# Patient Record
Sex: Male | Born: 1945 | Hispanic: No | Smoking: Former smoker
Health system: Southern US, Community
[De-identification: ages and names within clinical notes are randomized; demographics above are authoritative.]

## PROBLEM LIST (undated history)

## (undated) ENCOUNTER — Other Ambulatory Visit

## (undated) ENCOUNTER — Encounter

## (undated) ENCOUNTER — Ambulatory Visit: Payer: MEDICARE

## (undated) ENCOUNTER — Ambulatory Visit

## (undated) ENCOUNTER — Ambulatory Visit
Payer: MEDICARE | Attending: Student in an Organized Health Care Education/Training Program | Primary: Student in an Organized Health Care Education/Training Program

## (undated) ENCOUNTER — Telehealth

## (undated) ENCOUNTER — Ambulatory Visit
Payer: MEDICARE | Attending: Rehabilitative and Restorative Service Providers" | Primary: Rehabilitative and Restorative Service Providers"

## (undated) ENCOUNTER — Encounter
Attending: Student in an Organized Health Care Education/Training Program | Primary: Student in an Organized Health Care Education/Training Program

## (undated) ENCOUNTER — Ambulatory Visit: Payer: Medicare (Managed Care)

## (undated) ENCOUNTER — Ambulatory Visit
Payer: MEDICAID | Attending: Student in an Organized Health Care Education/Training Program | Primary: Student in an Organized Health Care Education/Training Program

## (undated) ENCOUNTER — Encounter: Attending: Neurology | Primary: Neurology

## (undated) ENCOUNTER — Inpatient Hospital Stay: Payer: MEDICARE

## (undated) ENCOUNTER — Telehealth
Attending: Student in an Organized Health Care Education/Training Program | Primary: Student in an Organized Health Care Education/Training Program

## (undated) ENCOUNTER — Ambulatory Visit
Attending: Student in an Organized Health Care Education/Training Program | Primary: Student in an Organized Health Care Education/Training Program

## (undated) DIAGNOSIS — I2699 Other pulmonary embolism without acute cor pulmonale: Secondary | ICD-10-CM

## (undated) DIAGNOSIS — I509 Heart failure, unspecified: Secondary | ICD-10-CM

## (undated) DIAGNOSIS — M1990 Unspecified osteoarthritis, unspecified site: Secondary | ICD-10-CM

## (undated) DIAGNOSIS — J449 Chronic obstructive pulmonary disease, unspecified: Secondary | ICD-10-CM

## (undated) DIAGNOSIS — R011 Cardiac murmur, unspecified: Secondary | ICD-10-CM

## (undated) DIAGNOSIS — Z972 Presence of dental prosthetic device (complete) (partial): Secondary | ICD-10-CM

## (undated) DIAGNOSIS — E119 Type 2 diabetes mellitus without complications: Secondary | ICD-10-CM

## (undated) DIAGNOSIS — B029 Zoster without complications: Secondary | ICD-10-CM

## (undated) DIAGNOSIS — G709 Myoneural disorder, unspecified: Secondary | ICD-10-CM

## (undated) DIAGNOSIS — E1140 Type 2 diabetes mellitus with diabetic neuropathy, unspecified: Secondary | ICD-10-CM

## (undated) DIAGNOSIS — G4730 Sleep apnea, unspecified: Secondary | ICD-10-CM

## (undated) DIAGNOSIS — G629 Polyneuropathy, unspecified: Secondary | ICD-10-CM

## (undated) DIAGNOSIS — I4891 Unspecified atrial fibrillation: Secondary | ICD-10-CM

## (undated) DIAGNOSIS — I10 Essential (primary) hypertension: Secondary | ICD-10-CM

## (undated) HISTORY — DX: Chronic obstructive pulmonary disease, unspecified: J44.9

## (undated) HISTORY — PX: KNEE SURGERY: SHX244

## (undated) HISTORY — DX: Essential (primary) hypertension: I10

## (undated) HISTORY — PX: FINGER SURGERY: SHX640

## (undated) HISTORY — PX: WRIST SURGERY: SHX841

## (undated) HISTORY — DX: Type 2 diabetes mellitus without complications: E11.9

## (undated) HISTORY — DX: Type 2 diabetes mellitus with diabetic neuropathy, unspecified: E11.40

---

## 2013-05-22 DIAGNOSIS — H2510 Age-related nuclear cataract, unspecified eye: Secondary | ICD-10-CM | POA: Insufficient documentation

## 2013-09-16 DIAGNOSIS — E669 Obesity, unspecified: Secondary | ICD-10-CM | POA: Insufficient documentation

## 2013-09-16 DIAGNOSIS — J449 Chronic obstructive pulmonary disease, unspecified: Secondary | ICD-10-CM | POA: Insufficient documentation

## 2013-09-16 DIAGNOSIS — I10 Essential (primary) hypertension: Secondary | ICD-10-CM | POA: Insufficient documentation

## 2013-09-16 DIAGNOSIS — I480 Paroxysmal atrial fibrillation: Secondary | ICD-10-CM | POA: Insufficient documentation

## 2013-09-16 DIAGNOSIS — I5021 Acute systolic (congestive) heart failure: Secondary | ICD-10-CM | POA: Insufficient documentation

## 2013-10-08 DIAGNOSIS — I5020 Unspecified systolic (congestive) heart failure: Secondary | ICD-10-CM | POA: Insufficient documentation

## 2013-10-08 DIAGNOSIS — I429 Cardiomyopathy, unspecified: Secondary | ICD-10-CM | POA: Insufficient documentation

## 2013-11-05 DIAGNOSIS — Z1211 Encounter for screening for malignant neoplasm of colon: Secondary | ICD-10-CM | POA: Insufficient documentation

## 2013-11-11 DIAGNOSIS — J439 Emphysema, unspecified: Secondary | ICD-10-CM | POA: Insufficient documentation

## 2013-11-11 DIAGNOSIS — I5022 Chronic systolic (congestive) heart failure: Secondary | ICD-10-CM | POA: Insufficient documentation

## 2013-11-11 DIAGNOSIS — I10 Essential (primary) hypertension: Secondary | ICD-10-CM | POA: Insufficient documentation

## 2013-11-19 DIAGNOSIS — B351 Tinea unguium: Secondary | ICD-10-CM | POA: Insufficient documentation

## 2013-11-25 DIAGNOSIS — H02403 Unspecified ptosis of bilateral eyelids: Secondary | ICD-10-CM | POA: Insufficient documentation

## 2013-11-25 DIAGNOSIS — E119 Type 2 diabetes mellitus without complications: Secondary | ICD-10-CM | POA: Insufficient documentation

## 2014-01-29 DIAGNOSIS — M2142 Flat foot [pes planus] (acquired), left foot: Secondary | ICD-10-CM

## 2014-01-29 DIAGNOSIS — M2141 Flat foot [pes planus] (acquired), right foot: Secondary | ICD-10-CM | POA: Insufficient documentation

## 2014-01-29 DIAGNOSIS — B353 Tinea pedis: Secondary | ICD-10-CM | POA: Insufficient documentation

## 2014-01-29 DIAGNOSIS — M79671 Pain in right foot: Secondary | ICD-10-CM | POA: Insufficient documentation

## 2014-01-29 DIAGNOSIS — M79672 Pain in left foot: Secondary | ICD-10-CM

## 2014-02-27 DIAGNOSIS — H401190 Primary open-angle glaucoma, unspecified eye, stage unspecified: Secondary | ICD-10-CM | POA: Insufficient documentation

## 2014-04-01 DIAGNOSIS — H25819 Combined forms of age-related cataract, unspecified eye: Secondary | ICD-10-CM | POA: Insufficient documentation

## 2014-04-01 DIAGNOSIS — M3501 Sicca syndrome with keratoconjunctivitis: Secondary | ICD-10-CM | POA: Insufficient documentation

## 2014-04-01 DIAGNOSIS — H2510 Age-related nuclear cataract, unspecified eye: Secondary | ICD-10-CM | POA: Insufficient documentation

## 2014-04-01 DIAGNOSIS — H16223 Keratoconjunctivitis sicca, not specified as Sjogren's, bilateral: Secondary | ICD-10-CM

## 2014-08-04 DIAGNOSIS — E1140 Type 2 diabetes mellitus with diabetic neuropathy, unspecified: Secondary | ICD-10-CM | POA: Insufficient documentation

## 2014-09-10 DIAGNOSIS — M1712 Unilateral primary osteoarthritis, left knee: Secondary | ICD-10-CM | POA: Insufficient documentation

## 2014-10-29 DIAGNOSIS — M25562 Pain in left knee: Secondary | ICD-10-CM | POA: Insufficient documentation

## 2015-03-16 DIAGNOSIS — G4733 Obstructive sleep apnea (adult) (pediatric): Secondary | ICD-10-CM | POA: Insufficient documentation

## 2015-03-16 DIAGNOSIS — J449 Chronic obstructive pulmonary disease, unspecified: Secondary | ICD-10-CM | POA: Insufficient documentation

## 2015-03-16 DIAGNOSIS — H401133 Primary open-angle glaucoma, bilateral, severe stage: Secondary | ICD-10-CM | POA: Insufficient documentation

## 2015-03-16 DIAGNOSIS — E119 Type 2 diabetes mellitus without complications: Secondary | ICD-10-CM | POA: Insufficient documentation

## 2015-03-16 DIAGNOSIS — H2510 Age-related nuclear cataract, unspecified eye: Secondary | ICD-10-CM | POA: Insufficient documentation

## 2015-08-30 HISTORY — PX: COLONOSCOPY: SHX174

## 2015-12-28 DIAGNOSIS — R0902 Hypoxemia: Secondary | ICD-10-CM | POA: Insufficient documentation

## 2016-02-09 DIAGNOSIS — R911 Solitary pulmonary nodule: Secondary | ICD-10-CM | POA: Insufficient documentation

## 2016-02-15 ENCOUNTER — Encounter: Payer: Self-pay | Admitting: Family Medicine

## 2016-02-15 ENCOUNTER — Ambulatory Visit (INDEPENDENT_AMBULATORY_CARE_PROVIDER_SITE_OTHER): Payer: Medicare Other | Admitting: Family Medicine

## 2016-02-15 VITALS — BP 132/78 | HR 79 | Ht 70.0 in | Wt 242.0 lb

## 2016-02-15 DIAGNOSIS — Z0000 Encounter for general adult medical examination without abnormal findings: Secondary | ICD-10-CM

## 2016-02-15 DIAGNOSIS — I5022 Chronic systolic (congestive) heart failure: Secondary | ICD-10-CM

## 2016-02-15 DIAGNOSIS — E1140 Type 2 diabetes mellitus with diabetic neuropathy, unspecified: Secondary | ICD-10-CM | POA: Diagnosis not present

## 2016-02-15 DIAGNOSIS — J432 Centrilobular emphysema: Secondary | ICD-10-CM

## 2016-02-15 DIAGNOSIS — I10 Essential (primary) hypertension: Secondary | ICD-10-CM

## 2016-02-15 NOTE — Progress Notes (Signed)
Name: Mario [name]   MRN: [id number]    DOB: [birth year]   Date:02/15/2016       Progress Note  Subjective  Chief Complaint  Chief Complaint  Patient presents with  . Establish Care    new to area  . Diabetes    discuss diabetes results going up    Diabetes  He presents for his follow-up diabetic visit. He has type 2 diabetes mellitus. His disease course has been fluctuating. There are no hypoglycemic associated symptoms. Pertinent negatives for hypoglycemia include no dizziness, headaches or nervousness/anxiousness. There are no diabetic associated symptoms. Pertinent negatives for diabetes include no blurred vision, no chest pain, no polydipsia and no weight loss. Symptoms are stable. There are no diabetic complications. Risk factors for coronary artery disease include dyslipidemia, diabetes mellitus, hypertension, male sex and obesity. Current diabetic treatment includes oral agent (monotherapy). He is compliant with treatment all of the time. His weight is stable. He is following a generally healthy diet. He participates in exercise intermittently. His breakfast blood glucose is taken between 8-9 am. His breakfast blood glucose range is generally 140-180 mg/dl. An ACE inhibitor/angiotensin II receptor blocker is being taken. He does not see a podiatrist.Eye exam is current.    No problem-specific Assessment & Plan notes found for this encounter.   Past Medical History:  Diagnosis Date  . COPD (chronic obstructive pulmonary disease) (HCC)   . Diabetes mellitus without complication (HCC)   . Diabetic neuropathy (HCC)   . Hypertension     Past Surgical History:  Procedure Laterality Date  . COLONOSCOPY  08/30/2015  . KNEE SURGERY Right    screw in knee  . WRIST SURGERY Left    metal removed    History reviewed. No pertinent family history.  Social History   Social History  . Marital status: Divorced    Spouse name: N/A  . Number of children: N/A  . Years of education:  N/A   Occupational History  . Not on file.   Social History Main Topics  . Smoking status: Former Games developer  . Smokeless tobacco: Never Used  . Alcohol use No  . Drug use: No  . Sexual activity: Not on file   Other Topics Concern  . Not on file   Social History Narrative  . No narrative on file    Allergies  Allergen Reactions  . Ace Inhibitors Anxiety     Review of Systems  Constitutional: Negative for chills, fever, malaise/fatigue and weight loss.  HENT: Negative for ear discharge, ear pain and sore throat.   Eyes: Negative for blurred vision.  Respiratory: Negative for cough, sputum production, shortness of breath and wheezing.   Cardiovascular: Negative for chest pain, palpitations and leg swelling.  Gastrointestinal: Negative for abdominal pain, blood in stool, constipation, diarrhea, heartburn, melena and nausea.  Genitourinary: Negative for dysuria, frequency, hematuria and urgency.  Musculoskeletal: Negative for back pain, joint pain, myalgias and neck pain.  Skin: Negative for rash.  Neurological: Negative for dizziness, tingling, sensory change, focal weakness and headaches.  Endo/Heme/Allergies: Negative for environmental allergies and polydipsia. Does not bruise/bleed easily.  Psychiatric/Behavioral: Negative for depression and suicidal ideas. The patient is not nervous/anxious and does not have insomnia.      Objective  Vitals:   02/15/16 1417  BP: 132/78  Pulse: 79  Weight: 242 lb (109.8 kg)  Height: 5\' 10"  (1.778 m)    Physical Exam  Constitutional: He is oriented to person, place, and time and well-developed, well-nourished,  and in no distress.  HENT:  Head: Normocephalic.  Right Ear: External ear normal.  Left Ear: External ear normal.  Nose: Nose normal.  Mouth/Throat: Oropharynx is clear and moist.  Eyes: Conjunctivae and EOM are normal. Pupils are equal, round, and reactive to light. Right eye exhibits no discharge. Left eye exhibits no  discharge. No scleral icterus.  Neck: Normal range of motion. Neck supple. No JVD present. No tracheal deviation present. No thyromegaly present.  Cardiovascular: Normal rate, regular rhythm, normal heart sounds and intact distal pulses.  Exam reveals no gallop and no friction rub.   No murmur heard. Pulmonary/Chest: Breath sounds normal. No respiratory distress. He has no wheezes. He has no rales.  Abdominal: Soft. Bowel sounds are normal. He exhibits no mass. There is no hepatosplenomegaly. There is no tenderness. There is no rebound, no guarding and no CVA tenderness.  Musculoskeletal: Normal range of motion. He exhibits no edema or tenderness.  Lymphadenopathy:    He has no cervical adenopathy.  Neurological: He is alert and oriented to person, place, and time. He has normal sensation, normal strength, normal reflexes and intact cranial nerves. No cranial nerve deficit.  Skin: Skin is warm. No rash noted.  Psychiatric: Mood and affect normal.  Nursing note and vitals reviewed.     Assessment & Plan  Problem List Items Addressed This Visit      Cardiovascular and Mediastinum   Chronic systolic heart failure (HCC)   Relevant Medications   losartan (COZAAR) 50 MG tablet   edoxaban (SAVAYSA) 60 MG TABS tablet   amLODipine (NORVASC) 5 MG tablet   sotalol (BETAPACE) 80 MG tablet   terazosin (HYTRIN) 5 MG capsule   furosemide (LASIX) 80 MG tablet   Other Relevant Orders   Renal Function Panel   Essential hypertension   Relevant Medications   losartan (COZAAR) 50 MG tablet   edoxaban (SAVAYSA) 60 MG TABS tablet   amLODipine (NORVASC) 5 MG tablet   sotalol (BETAPACE) 80 MG tablet   terazosin (HYTRIN) 5 MG capsule   furosemide (LASIX) 80 MG tablet   Other Relevant Orders   Renal Function Panel     Respiratory   COPD (chronic obstructive pulmonary disease) (HCC)   Relevant Medications   albuterol (PROVENTIL HFA;VENTOLIN HFA) 108 (90 Base) MCG/ACT inhaler   umeclidinium bromide  (INCRUSE ELLIPTA) 62.5 MCG/INH AEPB   fluticasone-salmeterol (ADVAIR HFA) 230-21 MCG/ACT inhaler   ipratropium-albuterol (DUONEB) 0.5-2.5 (3) MG/3ML SOLN     Endocrine   Type 2 diabetes mellitus with diabetic neuropathy (HCC)   Relevant Medications   metFORMIN (GLUCOPHAGE) 1000 MG tablet   losartan (COZAAR) 50 MG tablet   Other Relevant Orders   Renal Function Panel   Hemoglobin A1c    Other Visit Diagnoses    Encounter for medical examination to establish care    -  Primary     I spent 30 minutes with this patient, More than 50% of that time was spent in face to face education, counseling and care coordination.    Dr. [name] Rasmusseneanna Larin Depaoli [facility]  02/15/16

## 2016-02-16 LAB — RENAL FUNCTION PANEL
Albumin: 4.3 g/dL (ref 3.6–4.8)
BUN / CREAT RATIO: 20 (ref 10–24)
BUN: 24 mg/dL (ref 8–27)
CO2: 28 mmol/L (ref 18–29)
CREATININE: 1.22 mg/dL (ref 0.76–1.27)
Calcium: 9.5 mg/dL (ref 8.6–10.2)
Chloride: 97 mmol/L (ref 96–106)
GFR, EST AFRICAN AMERICAN: 69 mL/min/{1.73_m2} (ref >59)
GFR, EST NON AFRICAN AMERICAN: 60 mL/min/{1.73_m2} (ref >59)
Glucose: 87 mg/dL (ref 65–99)
Phosphorus: 3.2 mg/dL (ref 2.5–4.5)
Potassium: 4.2 mmol/L (ref 3.5–5.2)
SODIUM: 141 mmol/L (ref 134–144)

## 2016-02-16 LAB — HEMOGLOBIN A1C
Est. average glucose Bld gHb Est-mCnc: 148 mg/dL
Hgb A1c MFr Bld: 6.8 % — ABNORMAL HIGH (ref 4.8–5.6)

## 2016-04-04 ENCOUNTER — Encounter: Payer: Medicare Other | Attending: Internal Medicine | Admitting: Respiratory Therapy

## 2016-04-04 VITALS — Ht 70.0 in | Wt 249.6 lb

## 2016-04-04 DIAGNOSIS — J449 Chronic obstructive pulmonary disease, unspecified: Secondary | ICD-10-CM | POA: Diagnosis not present

## 2016-04-04 NOTE — Progress Notes (Signed)
Pulmonary Individual Treatment Plan  Patient Details  Name: Mario [name] MRN: [id number] Date of Birth: Jan 31, 1946 Referring Provider:   April Manson Pulmonary Rehab from 04/04/2016 in [facility] Cardiac and Pulmonary Rehab  Referring Provider  [place]      Initial Encounter Date:  Flowsheet Row Pulmonary Rehab from 04/04/2016 in [facility] Cardiac and Pulmonary Rehab  Date  04/04/16  Referring Provider  Thies      Visit Diagnosis: COPD, mild ([place])  Patient's Home Medications on Admission:  Current Outpatient Prescriptions:    albuterol (PROVENTIL HFA;VENTOLIN HFA) 108 (90 Base) MCG/ACT inhaler, Inhale 2 puffs into the lungs as needed., Disp: , Rfl:    amLODipine (NORVASC) 5 MG tablet, Take 1 tablet by mouth daily., Disp: , Rfl:    diclofenac sodium (VOLTAREN) 1 % GEL, Apply 1 application topically 4 (four) times daily as needed., Disp: , Rfl:    edoxaban (SAVAYSA) 60 MG TABS tablet, Take 60 mg by mouth daily. cardiology, Disp: , Rfl:    fluticasone-salmeterol (ADVAIR HFA) 230-21 MCG/ACT inhaler, Inhale 2 puffs into the lungs 2 (two) times daily., Disp: , Rfl:    furosemide (LASIX) 80 MG tablet, Take 1 tablet by mouth daily., Disp: , Rfl:    gabapentin (NEURONTIN) 300 MG capsule, Take 1 capsule by mouth 3 (three) times daily., Disp: , Rfl:    glucose blood (ONE TOUCH ULTRA TEST) test strip, Use as instructed FOR TESTING three times daily.  E11.9, Disp: , Rfl:    ipratropium-albuterol (DUONEB) 0.5-2.5 (3) MG/3ML SOLN, Inhale into the lungs., Disp: , Rfl:    losartan (COZAAR) 50 MG tablet, Take 1 tablet by mouth daily., Disp: , Rfl:    metFORMIN (GLUCOPHAGE) 1000 MG tablet, Take 1 tablet by mouth 2 (two) times daily., Disp: , Rfl:    sotalol (BETAPACE) 80 MG tablet, Take 1 tablet by mouth 2 (two) times daily., Disp: , Rfl:    terazosin (HYTRIN) 5 MG capsule, Take 1 capsule by mouth every other day., Disp: , Rfl:    umeclidinium bromide (INCRUSE ELLIPTA) 62.5 MCG/INH AEPB, Inhale 1  puff into the lungs daily., Disp: , Rfl:   Past Medical History: Past Medical History:  Diagnosis Date   COPD (chronic obstructive pulmonary disease) ([place])    Diabetes mellitus without complication ([place])    Diabetic neuropathy ([place])    Hypertension     Tobacco Use: History  Smoking Status   Former Smoker  Smokeless Tobacco   Never Used    Labs: 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 Cardiac and Pulmonary Rehab Latest Ref Rng & Units 02/15/2016   Hemoglobin A1c 4.8 - 5.6 % 6.8(H)       ADL UCSD:     Pulmonary Assessment Scores    Row Name 04/04/16 1253         ADL UCSD   ADL Phase Entry     SOB Score total 40     Rest 0     Walk 3     Stairs 4     Bath 1     Dress 0     Shop 2        Pulmonary Function Assessment:     Pulmonary Function Assessment - 04/04/16 1252      Pulmonary Function Tests   FVC% 34 %   FEV1% 35 %   FEV1/FVC Ratio 79.3     Initial Spirometry Results   Comments Test date 04/04/16     Breath  Bilateral Breath Sounds Decreased;Clear   Shortness of Breath Yes;Limiting activity;Fear of Shortness of Breath      Exercise Target Goals: Date: 04/04/16  Exercise Program Goal: Individual exercise prescription set with THRR, safety & activity barriers. Participant demonstrates ability to understand and report RPE using BORG scale, to self-measure pulse accurately, and to acknowledge the importance of the exercise prescription.  Exercise Prescription Goal: Starting with aerobic activity 30 plus minutes a day, 3 days per week for initial exercise prescription. Provide home exercise prescription and guidelines that participant acknowledges understanding prior to discharge.  Activity Barriers & Risk Stratification:     Activity Barriers & Cardiac Risk Stratification - 04/04/16 1251      Activity Barriers & Cardiac Risk Stratification   Activity Barriers Arthritis;Shortness of Breath;Deconditioning;Joint Problems    Cardiac Risk Stratification Moderate      6 Minute Walk:     6 Minute Walk    Row Name 04/04/16 1241         6 Minute Walk   Distance 1035 feet     Walk Time 6 minutes     # of Rest Breaks 0     MPH 1.96     METS 2.53     RPE 11     Perceived Dyspnea  2     VO2 Peak 8.65     Symptoms No     Resting HR 66 bpm     Resting BP 130/82     Max Ex. HR 122 bpm     Max Ex. BP 158/84       Interval HR   Baseline HR 66     1 Minute HR 94     2 Minute HR 96     3 Minute HR 110     4 Minute HR 100     5 Minute HR 122     6 Minute HR 84     2 Minute Post HR 69     Interval Heart Rate? Yes       Interval Oxygen   Interval Oxygen? Yes     Baseline Oxygen Saturation % 94 %     Baseline Liters of Oxygen 2 L     1 Minute Oxygen Saturation % 89 %     1 Minute Liters of Oxygen 2 L     2 Minute Oxygen Saturation % 96 %     2 Minute Liters of Oxygen 2 L     3 Minute Oxygen Saturation % 86 %     3 Minute Liters of Oxygen 2 L     4 Minute Oxygen Saturation % 86 %     4 Minute Liters of Oxygen 2 L     5 Minute Oxygen Saturation % 85 %     5 Minute Liters of Oxygen 2 L     6 Minute Oxygen Saturation % 83 %     6 Minute Liters of Oxygen 2 L     2 Minute Post Oxygen Saturation % 95 %     2 Minute Post Liters of Oxygen 2 L        Initial Exercise Prescription:     Initial Exercise Prescription - 04/04/16 1200      Date of Initial Exercise RX and Referring Provider   Date 04/04/16   Referring Provider Thies     Oxygen   Oxygen Continuous   Liters 2     Treadmill   MPH 1.8  Grade 0   Minutes 15   METs 2.38     NuStep   Level 3   Minutes 15   METs 2.3     Recumbant Elliptical   Level 1   RPM 50   Minutes 15   METs 2.3     T5 Nustep   Level 2   Minutes 15   METs 2.3     Biostep-RELP   Level 3   Minutes 15   METs 2.3     Prescription Details   Frequency (times per week) 3   Duration Progress to 45 minutes of aerobic exercise without signs/symptoms of  physical distress     Intensity   THRR 40-80% of Max Heartrate 100-134   Ratings of Perceived Exertion 11-13   Perceived Dyspnea 0-4     Progression   Progression Continue to progress workloads to maintain intensity without signs/symptoms of physical distress.     Resistance Training   Training Prescription Yes   Weight 3   Reps 10-15      Perform Capillary Blood Glucose checks as needed.  Exercise Prescription Changes:   Exercise Comments:   Discharge Exercise Prescription (Final Exercise Prescription Changes):    Nutrition:  Target Goals: Understanding of nutrition guidelines, daily intake of sodium 1500mg , cholesterol 200mg , calories 30% from fat and 7% or less from saturated fats, daily to have 5 or more servings of fruits and vegetables.  Biometrics:     Pre Biometrics - 04/04/16 1241      Pre Biometrics   Height 5\' 10"  (1.778 m)   Weight 249 lb 9.6 oz (113.2 kg)   Waist Circumference 51.75 inches   Hip Circumference 43 inches   Waist to Hip Ratio 1.2 %   BMI (Calculated) 35.9       Nutrition Therapy Plan and Nutrition Goals:   Nutrition Discharge: Rate Your Plate Scores:   Psychosocial: Target Goals: Acknowledge presence or absence of depression, maximize coping skills, provide positive support system. Participant is able to verbalize types and ability to use techniques and skills needed for reducing stress and depression.  Initial Review & Psychosocial Screening:     Initial Psych Review & Screening - 04/04/16 1315      Family Dynamics   Good Support System? Yes   Comments Mario [name] has good support from his wife. He hopes to be more active. He was very athletic in the past and misses being fit. [place], he has stress with new physician appointments, but Mario [name] is looking forward to participating in [name].      Barriers   Psychosocial barriers to participate in program The patient should benefit  from training in stress management and relaxation.     Screening Interventions   Interventions Encouraged to exercise;Program counselor consult      Quality of Life Scores:     Quality of Life - 04/04/16 1320      Quality of Life Scores   Health/Function Pre 19.63 %   Socioeconomic Pre 20.71 %   Psych/Spiritual Pre 21 %   Family Pre 21 %   GLOBAL Pre 20.31 %      PHQ-9: Recent Review Flowsheet Data    Depression screen [facility] 04/04/2016 02/15/2016   Decreased Interest 2 0   Down, Depressed, Hopeless 2 0   PHQ - 2 Score 4 0   Altered sleeping 1 -   Tired, decreased energy 3 -   Change  in appetite 1 -   Feeling bad or failure about yourself  0 -   Trouble concentrating 1 -   Moving slowly or fidgety/restless 0 -   Suicidal thoughts 0 -   PHQ-9 Score 10 -   Difficult doing work/chores Somewhat difficult -      Psychosocial Evaluation and Intervention:   Psychosocial Re-Evaluation:  Education: Education Goals: Education classes will be provided on a weekly basis, covering required topics. Participant will state understanding/return demonstration of topics presented.  Learning Barriers/Preferences:     Learning Barriers/Preferences - 04/04/16 1251      Learning Barriers/Preferences   Learning Barriers None   Learning Preferences None      Education Topics: Initial Evaluation Education: - Verbal, written and demonstration of respiratory meds, RPE/PD scales, oximetry and breathing techniques. Instruction on use of nebulizers and MDIs: cleaning and proper use, rinsing mouth with steroid doses and importance of monitoring MDI activations. Flowsheet Row Pulmonary Rehab [facility]  Date  04/04/16  Educator  LB  Instruction Review Code  2- meets goals/outcomes      General Nutrition Guidelines/Fats and Fiber: -Group instruction provided by verbal, written material, models and posters to present the general guidelines for  heart healthy nutrition. Gives an explanation and review of dietary fats and fiber.   Controlling Sodium/Reading Food Labels: -Group verbal and written material supporting the discussion of sodium use in heart healthy nutrition. Review and explanation with models, verbal and written materials for utilization of the food label.   Exercise Physiology & Risk Factors: - Group verbal and written instruction with models to review the exercise physiology of the cardiovascular system and associated critical values. Details cardiovascular disease risk factors and the goals associated with each risk factor.   Aerobic Exercise & Resistance Training: - Gives group verbal and written discussion on the health impact of inactivity. On the components of aerobic and resistive training programs and the benefits of this training and how to safely progress through these programs.   Flexibility, Balance, General Exercise Guidelines: - Provides group verbal and written instruction on the benefits of flexibility and balance training programs. Provides general exercise guidelines with specific guidelines to those with heart or lung disease. Demonstration and skill practice provided.   Stress Management: - Provides group verbal and written instruction about the health risks of elevated stress, cause of high stress, and healthy ways to reduce stress.   Depression: - Provides group verbal and written instruction on the correlation between heart/lung disease and depressed mood, treatment options, and the stigmas associated with seeking treatment.   Exercise & Equipment Safety: - Individual verbal instruction and demonstration of equipment use and safety with use of the equipment.   Infection Prevention: - Provides verbal and written material to individual with discussion of infection control including proper hand washing and proper equipment cleaning during exercise session. Flowsheet Row Pulmonary Rehab [facility]  [facility]04[facility]  Date  04/04/16  Educator  LB  Instruction Review Code  2- meets goals/outcomes      Falls Prevention: - Provides verbal and written material to individual with discussion of falls prevention and safety. Flowsheet Row [facility]  Date  04/04/16  Educator  LB  Instruction Review Code  2- meets goals/outcomes      Diabetes: - Individual verbal and written instruction to review signs/symptoms of diabetes, desired ranges of glucose level fasting, after  meals and with exercise. Advice that pre and post exercise glucose checks will be done for 3 sessions at entry of program. Flowsheet Row Pulmonary Rehab from 04/04/2016 in [facility] and Pulmonary Rehab  Date  04/04/16  Educator  LB  Instruction Review Code  2- meets goals/outcomes      Chronic Lung Diseases: - Group verbal and written instruction to review new updates, new respiratory medications, new advancements in procedures and treatments. Provide informative websites and "800" numbers of self-education.   Lung Procedures: - Group verbal and written instruction to describe testing methods done to diagnose lung disease. Review the outcome of test results. Describe the treatment choices: Pulmonary Function Tests, ABGs and oximetry.   Energy Conservation: - Provide group verbal and written instruction for methods to conserve energy, plan and organize activities. Instruct on pacing techniques, use of adaptive equipment and posture/positioning to relieve shortness of breath.   Triggers: - Group verbal and written instruction to review types of environmental controls: home humidity, furnaces, filters, dust mite/pet prevention, HEPA vacuums. To discuss weather changes, air quality and the benefits of nasal washing.   Exacerbations: - Group verbal and written instruction to provide: warning signs, infection symptoms, calling MD  promptly, preventive modes, and value of vaccinations. Review: effective airway clearance, coughing and/or vibration techniques. Create an Sport and exercise psychologist.   Oxygen: - Individual and group verbal and written instruction on oxygen therapy. Includes supplement oxygen, available portable oxygen systems, continuous and intermittent flow rates, oxygen safety, concentrators, and Medicare reimbursement for oxygen. Flowsheet Row Pulmonary Rehab from 04/04/2016 in [facility] Cardiac and Pulmonary Rehab  Date  04/04/16  Educator  LB  Instruction Review Code  2- meets goals/outcomes      Respiratory Medications: - Group verbal and written instruction to review medications for lung disease. Drug class, frequency, complications, importance of spacers, rinsing mouth after steroid MDI's, and proper cleaning methods for nebulizers.   AED/CPR: - Group verbal and written instruction with the use of models to demonstrate the basic use of the AED with the basic ABC's of resuscitation.   Breathing Retraining: - Provides individuals verbal and written instruction on purpose, frequency, and proper technique of diaphragmatic breathing and pursed-lipped breathing. Applies individual practice skills. Flowsheet Row Pulmonary Rehab from 04/04/2016 in [facility] Cardiac and Pulmonary Rehab  Date  04/04/16  Educator  LB  Instruction Review Code  2- meets goals/outcomes      Anatomy and Physiology of the Lungs: - Group verbal and written instruction with the use of models to provide basic lung anatomy and physiology related to function, structure and complications of lung disease.   Heart Failure: - Group verbal and written instruction on the basics of heart failure: signs/symptoms, treatments, explanation of ejection fraction, enlarged heart and cardiomyopathy.   Sleep Apnea: - Individual verbal and written instruction to review Obstructive Sleep Apnea. Review of risk factors, methods for diagnosing and types of masks and  machines for OSA.   Anxiety: - Provides group, verbal and written instruction on the correlation between heart/lung disease and anxiety, treatment options, and management of anxiety.   Relaxation: - Provides group, verbal and written instruction about the benefits of relaxation for patients with heart/lung disease. Also provides patients with examples of relaxation techniques.   Knowledge Questionnaire Score:     Knowledge Questionnaire Score - 04/04/16 1251      Knowledge Questionnaire Score   Pre Score 6/10       Core Components/Risk Factors/Patient Goals at Admission:  Personal Goals and Risk Factors at Admission - 04/04/16 1301      Core Components/Risk Factors/Patient Goals on Admission    Weight Management Yes;Weight Loss   Intervention Weight Management: Develop a combined nutrition and exercise program designed to reach desired caloric intake, while maintaining appropriate intake of nutrient and fiber, sodium and fats, and appropriate energy expenditure required for the weight goal.;Weight Management: Provide education and appropriate resources to help participant work on and attain dietary goals.;Weight Management/Obesity: Establish reasonable short term and long term weight goals.   Admit Weight 249 lb 9.6 oz (113.2 kg)   Goal Weight: Short Term 240 lb (108.9 kg)   Goal Weight: Long Term 200 lb (90.7 kg)   Expected Outcomes Short Term: Continue to assess and modify interventions until short term weight is achieved;Long Term: Adherence to nutrition and physical activity/exercise program aimed toward attainment of established weight goal;Weight Maintenance: Understanding of the daily nutrition guidelines, which includes 25-35% calories from fat, 7% or less cal from saturated fats, less than 200mg  cholesterol, less than 1.5gm of sodium, & 5 or more servings of fruits and vegetables daily;Weight Loss: Understanding of general recommendations for a balanced deficit meal plan,  which promotes 1-2 lb weight loss per week and includes a negative energy balance of [phone] kcal/d;Understanding recommendations for meals to include 15-35% energy as protein, 25-35% energy from fat, 35-60% energy from carbohydrates, less than 200mg  of dietary cholesterol, 20-35 gm of total fiber daily;Understanding of distribution of calorie intake throughout the day with the consumption of 4-5 meals/snacks   Sedentary Yes  Planning to join [employer] with wife.   Intervention Provide advice, education, support and counseling about physical activity/exercise needs.;Develop an individualized exercise prescription for aerobic and resistive training based on initial evaluation findings, risk stratification, comorbidities and participant's personal goals.   Expected Outcomes Achievement of increased cardiorespiratory fitness and enhanced flexibility, muscular endurance and strength shown through measurements of functional capacity and personal statement of participant.   Increase Strength and Stamina Yes   Intervention Provide advice, education, support and counseling about physical activity/exercise needs.;Develop an individualized exercise prescription for aerobic and resistive training based on initial evaluation findings, risk stratification, comorbidities and participant's personal goals.   Expected Outcomes Achievement of increased cardiorespiratory fitness and enhanced flexibility, muscular endurance and strength shown through measurements of functional capacity and personal statement of participant.   Improve shortness of breath with ADL's Yes   Intervention Provide education, individualized exercise plan and daily activity instruction to help decrease symptoms of SOB with activities of daily living.   Expected Outcomes Short Term: Achieves a reduction of symptoms when performing activities of daily living.   Develop more efficient breathing techniques such as purse lipped breathing and  diaphragmatic breathing; and practicing self-pacing with activity Yes   Intervention Provide education, demonstration and support about specific breathing techniuqes utilized for more efficient breathing. Include techniques such as pursed lipped breathing, diaphragmatic breathing and self-pacing activity.   Expected Outcomes Short Term: Participant will be able to demonstrate and use breathing techniques as needed throughout daily activities.   Increase knowledge of respiratory medications and ability to use respiratory devices properly  Yes  4l/m oxygen for sleep; Albuterol, Advair, Incruse Ellipta, Duoneb SVN   Intervention Provide education and demonstration as needed of appropriate use of medications, inhalers, and oxygen therapy.   Expected Outcomes Short Term: Achieves understanding of medications use. Understands that oxygen is a medication prescribed by physician. Demonstrates appropriate use of inhaler and oxygen therapy.  Diabetes Yes   Intervention Provide education about signs/symptoms and action to take for hypo/hyperglycemia.;Provide education about proper nutrition, including hydration, and aerobic/resistive exercise prescription along with prescribed medications to achieve blood glucose in normal ranges: Fasting glucose 65-99 mg/dL   Expected Outcomes Short Term: Participant verbalizes understanding of the signs/symptoms and immediate care of hyper/hypoglycemia, proper foot care and importance of medication, aerobic/resistive exercise and nutrition plan for blood glucose control.;Long Term: Attainment of HbA1C < 7%.   Heart Failure Yes   Intervention Provide a combined exercise and nutrition program that is supplemented with education, support and counseling about heart failure. Directed toward relieving symptoms such as shortness of breath, decreased exercise tolerance, and extremity edema.   Expected Outcomes Improve functional capacity of life;Short term: Attendance in program 2-3  days a week with increased exercise capacity. Reported lower sodium intake. Reported increased fruit and vegetable intake. Reports medication compliance.;Short term: Daily weights obtained and reported for increase. Utilizing diuretic protocols set by physician.;Long term: Adoption of self-care skills and reduction of barriers for early signs and symptoms recognition and intervention leading to self-care maintenance.   Hypertension Yes   Intervention Provide education on lifestyle modifcations including regular physical activity/exercise, weight management, moderate sodium restriction and increased consumption of fresh fruit, vegetables, and low fat dairy, alcohol moderation, and smoking cessation.;Monitor prescription use compliance.   Expected Outcomes Short Term: Continued assessment and intervention until BP is < 140/40mm HG in hypertensive participants. < 130/40mm HG in hypertensive participants with diabetes, heart failure or chronic kidney disease.;Long Term: Maintenance of blood pressure at goal levels.      Core Components/Risk Factors/Patient Goals Review:    Core Components/Risk Factors/Patient Goals at Discharge (Final Review):    ITP Comments:   Comments: Mario [name] plans to start [name] on 04/12/16 and attend 3 days/week.

## 2016-04-04 NOTE — Patient Instructions (Signed)
Patient Instructions  Patient Details  Name: Mario [name] MRN: [id number] Date of Birth: 03-15-1946 Referring Provider:  Mickey Farberhies, David, MD  Below are the personal goals you chose as well as exercise and nutrition goals. Our goal is to help you keep on track towards obtaining and maintaining your goals. We will be discussing your progress on these goals with you throughout the program.  Initial Exercise Prescription:     Initial Exercise Prescription - 04/04/16 1200      Date of Initial Exercise RX and Referring Provider   Date 04/04/16   Referring Provider Thies     Oxygen   Oxygen Continuous   Liters 2     Treadmill   MPH 1.8   Grade 0   Minutes 15   METs 2.38     NuStep   Level 3   Minutes 15   METs 2.3     Recumbant Elliptical   Level 1   RPM 50   Minutes 15   METs 2.3     T5 Nustep   Level 2   Minutes 15   METs 2.3     Biostep-RELP   Level 3   Minutes 15   METs 2.3     Prescription Details   Frequency (times per week) 3   Duration Progress to 45 minutes of aerobic exercise without signs/symptoms of physical distress     Intensity   THRR 40-80% of Max Heartrate 100-134   Ratings of Perceived Exertion 11-13   Perceived Dyspnea 0-4     Progression   Progression Continue to progress workloads to maintain intensity without signs/symptoms of physical distress.     Resistance Training   Training Prescription Yes   Weight 3   Reps 10-15      Exercise Goals: Frequency: Be able to perform aerobic exercise three times per week working toward 3-5 days per week.  Intensity: Work with a perceived exertion of 11 (fairly light) - 15 (hard) as tolerated. Follow your new exercise prescription and watch for changes in prescription as you progress with the program. Changes will be reviewed with you when they are made.  Duration: You should be able to do 30 minutes of continuous aerobic exercise in addition to a 5 minute warm-up and a 5 minute cool-down  routine.  Nutrition Goals: Your personal nutrition goals will be established when you do your nutrition analysis with the dietician.  The following are nutrition guidelines to follow: Cholesterol < 200mg /day Sodium < 1500mg /day Fiber: Men over 50 yrs - 30 grams per day  Personal Goals:     Personal Goals and Risk Factors at Admission - 04/04/16 1301      Core Components/Risk Factors/Patient Goals on Admission    Weight Management Yes;Weight Loss   Intervention Weight Management: Develop a combined nutrition and exercise program designed to reach desired caloric intake, while maintaining appropriate intake of nutrient and fiber, sodium and fats, and appropriate energy expenditure required for the weight goal.;Weight Management: Provide education and appropriate resources to help participant work on and attain dietary goals.;Weight Management/Obesity: Establish reasonable short term and long term weight goals.   Admit Weight 249 lb 9.6 oz (113.2 kg)   Goal Weight: Short Term 240 lb (108.9 kg)   Goal Weight: Long Term 200 lb (90.7 kg)   Expected Outcomes Short Term: Continue to assess and modify interventions until short term weight is achieved;Long Term: Adherence to nutrition and physical activity/exercise program aimed toward attainment of established weight goal;Weight  Maintenance: Understanding of the daily nutrition guidelines, which includes 25-35% calories from fat, 7% or less cal from saturated fats, less than 200mg  cholesterol, less than 1.5gm of sodium, & 5 or more servings of fruits and vegetables daily;Weight Loss: Understanding of general recommendations for a balanced deficit meal plan, which promotes 1-2 lb weight loss per week and includes a negative energy balance of [phone] kcal/d;Understanding recommendations for meals to include 15-35% energy as protein, 25-35% energy from fat, 35-60% energy from carbohydrates, less than 200mg  of dietary cholesterol, 20-35 gm of total fiber  daily;Understanding of distribution of calorie intake throughout the day with the consumption of 4-5 meals/snacks   Sedentary Yes  Planning to join [employer] with wife.   Intervention Provide advice, education, support and counseling about physical activity/exercise needs.;Develop an individualized exercise prescription for aerobic and resistive training based on initial evaluation findings, risk stratification, comorbidities and participant's personal goals.   Expected Outcomes Achievement of increased cardiorespiratory fitness and enhanced flexibility, muscular endurance and strength shown through measurements of functional capacity and personal statement of participant.   Increase Strength and Stamina Yes   Intervention Provide advice, education, support and counseling about physical activity/exercise needs.;Develop an individualized exercise prescription for aerobic and resistive training based on initial evaluation findings, risk stratification, comorbidities and participant's personal goals.   Expected Outcomes Achievement of increased cardiorespiratory fitness and enhanced flexibility, muscular endurance and strength shown through measurements of functional capacity and personal statement of participant.   Improve shortness of breath with ADL's Yes   Intervention Provide education, individualized exercise plan and daily activity instruction to help decrease symptoms of SOB with activities of daily living.   Expected Outcomes Short Term: Achieves a reduction of symptoms when performing activities of daily living.   Develop more efficient breathing techniques such as purse lipped breathing and diaphragmatic breathing; and practicing self-pacing with activity Yes   Intervention Provide education, demonstration and support about specific breathing techniuqes utilized for more efficient breathing. Include techniques such as pursed lipped breathing, diaphragmatic breathing and self-pacing activity.    Expected Outcomes Short Term: Participant will be able to demonstrate and use breathing techniques as needed throughout daily activities.   Increase knowledge of respiratory medications and ability to use respiratory devices properly  Yes  4l/m oxygen for sleep; Albuterol, Advair, Incruse Ellipta, Duoneb SVN   Intervention Provide education and demonstration as needed of appropriate use of medications, inhalers, and oxygen therapy.   Expected Outcomes Short Term: Achieves understanding of medications use. Understands that oxygen is a medication prescribed by physician. Demonstrates appropriate use of inhaler and oxygen therapy.   Diabetes Yes   Intervention Provide education about signs/symptoms and action to take for hypo/hyperglycemia.;Provide education about proper nutrition, including hydration, and aerobic/resistive exercise prescription along with prescribed medications to achieve blood glucose in normal ranges: Fasting glucose 65-99 mg/dL   Expected Outcomes Short Term: Participant verbalizes understanding of the signs/symptoms and immediate care of hyper/hypoglycemia, proper foot care and importance of medication, aerobic/resistive exercise and nutrition plan for blood glucose control.;Long Term: Attainment of HbA1C < 7%.   Heart Failure Yes   Intervention Provide a combined exercise and nutrition program that is supplemented with education, support and counseling about heart failure. Directed toward relieving symptoms such as shortness of breath, decreased exercise tolerance, and extremity edema.   Expected Outcomes Improve functional capacity of life;Short term: Attendance in program 2-3 days a week with increased exercise capacity. Reported lower sodium intake. Reported increased fruit and vegetable intake. Reports  medication compliance.;Short term: Daily weights obtained and reported for increase. Utilizing diuretic protocols set by physician.;Long term: Adoption of self-care skills and  reduction of barriers for early signs and symptoms recognition and intervention leading to self-care maintenance.   Hypertension Yes   Intervention Provide education on lifestyle modifcations including regular physical activity/exercise, weight management, moderate sodium restriction and increased consumption of fresh fruit, vegetables, and low fat dairy, alcohol moderation, and smoking cessation.;Monitor prescription use compliance.   Expected Outcomes Short Term: Continued assessment and intervention until BP is < 140/3490mm HG in hypertensive participants. < 130/3880mm HG in hypertensive participants with diabetes, heart failure or chronic kidney disease.;Long Term: Maintenance of blood pressure at goal levels.      Tobacco Use Initial Evaluation: History  Smoking Status   Former Smoker  Smokeless Tobacco   Never Used    Copy of goals given to participant.

## 2016-04-12 ENCOUNTER — Encounter: Payer: Self-pay | Admitting: Respiratory Therapy

## 2016-04-12 ENCOUNTER — Telehealth: Payer: Self-pay | Admitting: Respiratory Therapy

## 2016-04-12 DIAGNOSIS — J449 Chronic obstructive pulmonary disease, unspecified: Secondary | ICD-10-CM

## 2016-04-17 ENCOUNTER — Encounter: Payer: Self-pay | Admitting: Respiratory Therapy

## 2016-04-17 ENCOUNTER — Encounter: Payer: Medicare Other | Admitting: *Deleted

## 2016-04-17 DIAGNOSIS — J449 Chronic obstructive pulmonary disease, unspecified: Secondary | ICD-10-CM

## 2016-04-17 LAB — GLUCOSE, CAPILLARY
Glucose-Capillary: 160 mg/dL — ABNORMAL HIGH (ref 65–99)
Glucose-Capillary: 105 mg/dL — ABNORMAL HIGH (ref 65–99)

## 2016-04-17 NOTE — Progress Notes (Signed)
Pulmonary Individual Treatment Plan  Patient Details  Name: Mario [name] MRN: [id number] Date of Birth: Jan 31, 1946 Referring Provider:   April Manson Pulmonary Rehab from 04/04/2016 in [facility] Cardiac and Pulmonary Rehab  Referring Provider  [place]      Initial Encounter Date:  Flowsheet Row Pulmonary Rehab from 04/04/2016 in [facility] Cardiac and Pulmonary Rehab  Date  04/04/16  Referring Provider  Thies      Visit Diagnosis: COPD, mild ([place])  Patient's Home Medications on Admission:  Current Outpatient Prescriptions:    albuterol (PROVENTIL HFA;VENTOLIN HFA) 108 (90 Base) MCG/ACT inhaler, Inhale 2 puffs into the lungs as needed., Disp: , Rfl:    amLODipine (NORVASC) 5 MG tablet, Take 1 tablet by mouth daily., Disp: , Rfl:    diclofenac sodium (VOLTAREN) 1 % GEL, Apply 1 application topically 4 (four) times daily as needed., Disp: , Rfl:    edoxaban (SAVAYSA) 60 MG TABS tablet, Take 60 mg by mouth daily. cardiology, Disp: , Rfl:    fluticasone-salmeterol (ADVAIR HFA) 230-21 MCG/ACT inhaler, Inhale 2 puffs into the lungs 2 (two) times daily., Disp: , Rfl:    furosemide (LASIX) 80 MG tablet, Take 1 tablet by mouth daily., Disp: , Rfl:    gabapentin (NEURONTIN) 300 MG capsule, Take 1 capsule by mouth 3 (three) times daily., Disp: , Rfl:    glucose blood (ONE TOUCH ULTRA TEST) test strip, Use as instructed FOR TESTING three times daily.  E11.9, Disp: , Rfl:    ipratropium-albuterol (DUONEB) 0.5-2.5 (3) MG/3ML SOLN, Inhale into the lungs., Disp: , Rfl:    losartan (COZAAR) 50 MG tablet, Take 1 tablet by mouth daily., Disp: , Rfl:    metFORMIN (GLUCOPHAGE) 1000 MG tablet, Take 1 tablet by mouth 2 (two) times daily., Disp: , Rfl:    sotalol (BETAPACE) 80 MG tablet, Take 1 tablet by mouth 2 (two) times daily., Disp: , Rfl:    terazosin (HYTRIN) 5 MG capsule, Take 1 capsule by mouth every other day., Disp: , Rfl:    umeclidinium bromide (INCRUSE ELLIPTA) 62.5 MCG/INH AEPB, Inhale 1  puff into the lungs daily., Disp: , Rfl:   Past Medical History: Past Medical History:  Diagnosis Date   COPD (chronic obstructive pulmonary disease) ([place])    Diabetes mellitus without complication ([place])    Diabetic neuropathy ([place])    Hypertension     Tobacco Use: History  Smoking Status   Former Smoker  Smokeless Tobacco   Never Used    Labs: 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 Cardiac and Pulmonary Rehab Latest Ref Rng & Units 02/15/2016   Hemoglobin A1c 4.8 - 5.6 % 6.8(H)       ADL UCSD:     Pulmonary Assessment Scores    Row Name 04/04/16 1253         ADL UCSD   ADL Phase Entry     SOB Score total 40     Rest 0     Walk 3     Stairs 4     Bath 1     Dress 0     Shop 2        Pulmonary Function Assessment:     Pulmonary Function Assessment - 04/04/16 1252      Pulmonary Function Tests   FVC% 34 %   FEV1% 35 %   FEV1/FVC Ratio 79.3     Initial Spirometry Results   Comments Test date 04/04/16     Breath  Bilateral Breath Sounds Decreased;Clear   Shortness of Breath Yes;Limiting activity;Fear of Shortness of Breath      Exercise Target Goals:    Exercise Program Goal: Individual exercise prescription set with THRR, safety & activity barriers. Participant demonstrates ability to understand and report RPE using BORG scale, to self-measure pulse accurately, and to acknowledge the importance of the exercise prescription.  Exercise Prescription Goal: Starting with aerobic activity 30 plus minutes a day, 3 days per week for initial exercise prescription. Provide home exercise prescription and guidelines that participant acknowledges understanding prior to discharge.  Activity Barriers & Risk Stratification:     Activity Barriers & Cardiac Risk Stratification - 04/04/16 1251      Activity Barriers & Cardiac Risk Stratification   Activity Barriers Arthritis;Shortness of Breath;Deconditioning;Joint Problems   Cardiac Risk  Stratification Moderate      6 Minute Walk:     6 Minute Walk    Row Name 04/04/16 1241         6 Minute Walk   Distance 1035 feet     Walk Time 6 minutes     # of Rest Breaks 0     MPH 1.96     METS 2.53     RPE 11     Perceived Dyspnea  2     VO2 Peak 8.65     Symptoms No     Resting HR 66 bpm     Resting BP 130/82     Max Ex. HR 122 bpm     Max Ex. BP 158/84       Interval HR   Baseline HR 66     1 Minute HR 94     2 Minute HR 96     3 Minute HR 110     4 Minute HR 100     5 Minute HR 122     6 Minute HR 84     2 Minute Post HR 69     Interval Heart Rate? Yes       Interval Oxygen   Interval Oxygen? Yes     Baseline Oxygen Saturation % 94 %     Baseline Liters of Oxygen 2 L     1 Minute Oxygen Saturation % 89 %     1 Minute Liters of Oxygen 2 L     2 Minute Oxygen Saturation % 96 %     2 Minute Liters of Oxygen 2 L     3 Minute Oxygen Saturation % 86 %     3 Minute Liters of Oxygen 2 L     4 Minute Oxygen Saturation % 86 %     4 Minute Liters of Oxygen 2 L     5 Minute Oxygen Saturation % 85 %     5 Minute Liters of Oxygen 2 L     6 Minute Oxygen Saturation % 83 %     6 Minute Liters of Oxygen 2 L     2 Minute Post Oxygen Saturation % 95 %     2 Minute Post Liters of Oxygen 2 L        Initial Exercise Prescription:     Initial Exercise Prescription - 04/04/16 1200      Date of Initial Exercise RX and Referring Provider   Date 04/04/16   Referring Provider Thies     Oxygen   Oxygen Continuous   Liters 2     Treadmill   MPH 1.8  Grade 0   Minutes 15   METs 2.38     NuStep   Level 3   Minutes 15   METs 2.3     Recumbant Elliptical   Level 1   RPM 50   Minutes 15   METs 2.3     T5 Nustep   Level 2   Minutes 15   METs 2.3     Biostep-RELP   Level 3   Minutes 15   METs 2.3     Prescription Details   Frequency (times per week) 3   Duration Progress to 45 minutes of aerobic exercise without signs/symptoms of physical  distress     Intensity   THRR 40-80% of Max Heartrate 100-134   Ratings of Perceived Exertion 11-13   Perceived Dyspnea 0-4     Progression   Progression Continue to progress workloads to maintain intensity without signs/symptoms of physical distress.     Resistance Training   Training Prescription Yes   Weight 3   Reps 10-15      Perform Capillary Blood Glucose checks as needed.  Exercise Prescription Changes:   Exercise Comments:   Discharge Exercise Prescription (Final Exercise Prescription Changes):    Nutrition:  Target Goals: Understanding of nutrition guidelines, daily intake of sodium 1500mg , cholesterol 200mg , calories 30% from fat and 7% or less from saturated fats, daily to have 5 or more servings of fruits and vegetables.  Biometrics:     Pre Biometrics - 04/04/16 1241      Pre Biometrics   Height 5\' 10"  (1.778 m)   Weight 249 lb 9.6 oz (113.2 kg)   Waist Circumference 51.75 inches   Hip Circumference 43 inches   Waist to Hip Ratio 1.2 %   BMI (Calculated) 35.9       Nutrition Therapy Plan and Nutrition Goals:   Nutrition Discharge: Rate Your Plate Scores:   Psychosocial: Target Goals: Acknowledge presence or absence of depression, maximize coping skills, provide positive support system. Participant is able to verbalize types and ability to use techniques and skills needed for reducing stress and depression.  Initial Review & Psychosocial Screening:     Initial Psych Review & Screening - 04/04/16 1315      Family Dynamics   Good Support System? Yes   Comments Mr Lalor has good support from his wife. He hopes to be more active. He was very athletic in the past and misses being fit. Having recently moved to this area from [place], he has stress with new physician appointments, but Mr Craighead is looking forward to participating in [name].      Barriers   Psychosocial barriers to participate in program The patient should benefit from  training in stress management and relaxation.     Screening Interventions   Interventions Encouraged to exercise;Program counselor consult      Quality of Life Scores:     Quality of Life - 04/04/16 1320      Quality of Life Scores   Health/Function Pre 19.63 %   Socioeconomic Pre 20.71 %   Psych/Spiritual Pre 21 %   Family Pre 21 %   GLOBAL Pre 20.31 %      PHQ-9: Recent Review Flowsheet Data    Depression screen [facility] 2/9 04/04/2016 02/15/2016   Decreased Interest 2 0   Down, Depressed, Hopeless 2 0   PHQ - 2 Score 4 0   Altered sleeping 1 -   Tired, decreased energy 3 -   Change  in appetite 1 -   Feeling bad or failure about yourself  0 -   Trouble concentrating 1 -   Moving slowly or fidgety/restless 0 -   Suicidal thoughts 0 -   PHQ-9 Score 10 -   Difficult doing work/chores Somewhat difficult -      Psychosocial Evaluation and Intervention:   Psychosocial Re-Evaluation:  Education: Education Goals: Education classes will be provided on a weekly basis, covering required topics. Participant will state understanding/return demonstration of topics presented.  Learning Barriers/Preferences:     Learning Barriers/Preferences - 04/04/16 1251      Learning Barriers/Preferences   Learning Barriers None   Learning Preferences None      Education Topics: Initial Evaluation Education: - Verbal, written and demonstration of respiratory meds, RPE/PD scales, oximetry and breathing techniques. Instruction on use of nebulizers and MDIs: cleaning and proper use, rinsing mouth with steroid doses and importance of monitoring MDI activations. Flowsheet Row Pulmonary Rehab [facility]  Date  04/04/16  Educator  LB  Instruction Review Code  2- meets goals/outcomes      General Nutrition Guidelines/Fats and Fiber: -Group instruction provided by verbal, written material, models and posters to present the general guidelines for  heart healthy nutrition. Gives an explanation and review of dietary fats and fiber.   Controlling Sodium/Reading Food Labels: -Group verbal and written material supporting the discussion of sodium use in heart healthy nutrition. Review and explanation with models, verbal and written materials for utilization of the food label.   Exercise Physiology & Risk Factors: - Group verbal and written instruction with models to review the exercise physiology of the cardiovascular system and associated critical values. Details cardiovascular disease risk factors and the goals associated with each risk factor.   Aerobic Exercise & Resistance Training: - Gives group verbal and written discussion on the health impact of inactivity. On the components of aerobic and resistive training programs and the benefits of this training and how to safely progress through these programs.   Flexibility, Balance, General Exercise Guidelines: - Provides group verbal and written instruction on the benefits of flexibility and balance training programs. Provides general exercise guidelines with specific guidelines to those with heart or lung disease. Demonstration and skill practice provided.   Stress Management: - Provides group verbal and written instruction about the health risks of elevated stress, cause of high stress, and healthy ways to reduce stress.   Depression: - Provides group verbal and written instruction on the correlation between heart/lung disease and depressed mood, treatment options, and the stigmas associated with seeking treatment.   Exercise & Equipment Safety: - Individual verbal instruction and demonstration of equipment use and safety with use of the equipment.   Infection Prevention: - Provides verbal and written material to individual with discussion of infection control including proper hand washing and proper equipment cleaning during exercise session. Flowsheet Row Pulmonary Rehab [facility]  [facility]04[facility]  Date  04/04/16  Educator  LB  Instruction Review Code  2- meets goals/outcomes      Falls Prevention: - Provides verbal and written material to individual with discussion of falls prevention and safety. Flowsheet Row [facility]  Date  04/04/16  Educator  LB  Instruction Review Code  2- meets goals/outcomes      Diabetes: - Individual verbal and written instruction to review signs/symptoms of diabetes, desired ranges of glucose level fasting, after  meals and with exercise. Advice that pre and post exercise glucose checks will be done for 3 sessions at entry of program. Flowsheet Row Pulmonary Rehab from 04/04/2016 in [facility] and Pulmonary Rehab  Date  04/04/16  Educator  LB  Instruction Review Code  2- meets goals/outcomes      Chronic Lung Diseases: - Group verbal and written instruction to review new updates, new respiratory medications, new advancements in procedures and treatments. Provide informative websites and "800" numbers of self-education.   Lung Procedures: - Group verbal and written instruction to describe testing methods done to diagnose lung disease. Review the outcome of test results. Describe the treatment choices: Pulmonary Function Tests, ABGs and oximetry.   Energy Conservation: - Provide group verbal and written instruction for methods to conserve energy, plan and organize activities. Instruct on pacing techniques, use of adaptive equipment and posture/positioning to relieve shortness of breath.   Triggers: - Group verbal and written instruction to review types of environmental controls: home humidity, furnaces, filters, dust mite/pet prevention, HEPA vacuums. To discuss weather changes, air quality and the benefits of nasal washing.   Exacerbations: - Group verbal and written instruction to provide: warning signs, infection symptoms, calling MD  promptly, preventive modes, and value of vaccinations. Review: effective airway clearance, coughing and/or vibration techniques. Create an Sport and exercise psychologist.   Oxygen: - Individual and group verbal and written instruction on oxygen therapy. Includes supplement oxygen, available portable oxygen systems, continuous and intermittent flow rates, oxygen safety, concentrators, and Medicare reimbursement for oxygen. Flowsheet Row Pulmonary Rehab from 04/04/2016 in [facility] Cardiac and Pulmonary Rehab  Date  04/04/16  Educator  LB  Instruction Review Code  2- meets goals/outcomes      Respiratory Medications: - Group verbal and written instruction to review medications for lung disease. Drug class, frequency, complications, importance of spacers, rinsing mouth after steroid MDI's, and proper cleaning methods for nebulizers.   AED/CPR: - Group verbal and written instruction with the use of models to demonstrate the basic use of the AED with the basic ABC's of resuscitation.   Breathing Retraining: - Provides individuals verbal and written instruction on purpose, frequency, and proper technique of diaphragmatic breathing and pursed-lipped breathing. Applies individual practice skills. Flowsheet Row Pulmonary Rehab from 04/04/2016 in [facility] Cardiac and Pulmonary Rehab  Date  04/04/16  Educator  LB  Instruction Review Code  2- meets goals/outcomes      Anatomy and Physiology of the Lungs: - Group verbal and written instruction with the use of models to provide basic lung anatomy and physiology related to function, structure and complications of lung disease.   Heart Failure: - Group verbal and written instruction on the basics of heart failure: signs/symptoms, treatments, explanation of ejection fraction, enlarged heart and cardiomyopathy.   Sleep Apnea: - Individual verbal and written instruction to review Obstructive Sleep Apnea. Review of risk factors, methods for diagnosing and types of masks and  machines for OSA.   Anxiety: - Provides group, verbal and written instruction on the correlation between heart/lung disease and anxiety, treatment options, and management of anxiety.   Relaxation: - Provides group, verbal and written instruction about the benefits of relaxation for patients with heart/lung disease. Also provides patients with examples of relaxation techniques.   Knowledge Questionnaire Score:     Knowledge Questionnaire Score - 04/04/16 1251      Knowledge Questionnaire Score   Pre Score 6/10       Core Components/Risk Factors/Patient Goals at Admission:  Personal Goals and Risk Factors at Admission - 04/04/16 1301      Core Components/Risk Factors/Patient Goals on Admission    Weight Management Yes;Weight Loss   Intervention Weight Management: Develop a combined nutrition and exercise program designed to reach desired caloric intake, while maintaining appropriate intake of nutrient and fiber, sodium and fats, and appropriate energy expenditure required for the weight goal.;Weight Management: Provide education and appropriate resources to help participant work on and attain dietary goals.;Weight Management/Obesity: Establish reasonable short term and long term weight goals.   Admit Weight 249 lb 9.6 oz (113.2 kg)   Goal Weight: Short Term 240 lb (108.9 kg)   Goal Weight: Long Term 200 lb (90.7 kg)   Expected Outcomes Short Term: Continue to assess and modify interventions until short term weight is achieved;Long Term: Adherence to nutrition and physical activity/exercise program aimed toward attainment of established weight goal;Weight Maintenance: Understanding of the daily nutrition guidelines, which includes 25-35% calories from fat, 7% or less cal from saturated fats, less than 200mg  cholesterol, less than 1.5gm of sodium, & 5 or more servings of fruits and vegetables daily;Weight Loss: Understanding of general recommendations for a balanced deficit meal plan,  which promotes 1-2 lb weight loss per week and includes a negative energy balance of [phone] kcal/d;Understanding recommendations for meals to include 15-35% energy as protein, 25-35% energy from fat, 35-60% energy from carbohydrates, less than 200mg  of dietary cholesterol, 20-35 gm of total fiber daily;Understanding of distribution of calorie intake throughout the day with the consumption of 4-5 meals/snacks   Sedentary Yes  Planning to join [employer] with wife.   Intervention Provide advice, education, support and counseling about physical activity/exercise needs.;Develop an individualized exercise prescription for aerobic and resistive training based on initial evaluation findings, risk stratification, comorbidities and participant's personal goals.   Expected Outcomes Achievement of increased cardiorespiratory fitness and enhanced flexibility, muscular endurance and strength shown through measurements of functional capacity and personal statement of participant.   Increase Strength and Stamina Yes   Intervention Provide advice, education, support and counseling about physical activity/exercise needs.;Develop an individualized exercise prescription for aerobic and resistive training based on initial evaluation findings, risk stratification, comorbidities and participant's personal goals.   Expected Outcomes Achievement of increased cardiorespiratory fitness and enhanced flexibility, muscular endurance and strength shown through measurements of functional capacity and personal statement of participant.   Improve shortness of breath with ADL's Yes   Intervention Provide education, individualized exercise plan and daily activity instruction to help decrease symptoms of SOB with activities of daily living.   Expected Outcomes Short Term: Achieves a reduction of symptoms when performing activities of daily living.   Develop more efficient breathing techniques such as purse lipped breathing and  diaphragmatic breathing; and practicing self-pacing with activity Yes   Intervention Provide education, demonstration and support about specific breathing techniuqes utilized for more efficient breathing. Include techniques such as pursed lipped breathing, diaphragmatic breathing and self-pacing activity.   Expected Outcomes Short Term: Participant will be able to demonstrate and use breathing techniques as needed throughout daily activities.   Increase knowledge of respiratory medications and ability to use respiratory devices properly  Yes  4l/m oxygen for sleep; Albuterol, Advair, Incruse Ellipta, Duoneb SVN   Intervention Provide education and demonstration as needed of appropriate use of medications, inhalers, and oxygen therapy.   Expected Outcomes Short Term: Achieves understanding of medications use. Understands that oxygen is a medication prescribed by physician. Demonstrates appropriate use of inhaler and oxygen therapy.  Diabetes Yes   Intervention Provide education about signs/symptoms and action to take for hypo/hyperglycemia.;Provide education about proper nutrition, including hydration, and aerobic/resistive exercise prescription along with prescribed medications to achieve blood glucose in normal ranges: Fasting glucose 65-99 mg/dL   Expected Outcomes Short Term: Participant verbalizes understanding of the signs/symptoms and immediate care of hyper/hypoglycemia, proper foot care and importance of medication, aerobic/resistive exercise and nutrition plan for blood glucose control.;Long Term: Attainment of HbA1C < 7%.   Heart Failure Yes   Intervention Provide a combined exercise and nutrition program that is supplemented with education, support and counseling about heart failure. Directed toward relieving symptoms such as shortness of breath, decreased exercise tolerance, and extremity edema.   Expected Outcomes Improve functional capacity of life;Short term: Attendance in program 2-3  days a week with increased exercise capacity. Reported lower sodium intake. Reported increased fruit and vegetable intake. Reports medication compliance.;Short term: Daily weights obtained and reported for increase. Utilizing diuretic protocols set by physician.;Long term: Adoption of self-care skills and reduction of barriers for early signs and symptoms recognition and intervention leading to self-care maintenance.   Hypertension Yes   Intervention Provide education on lifestyle modifcations including regular physical activity/exercise, weight management, moderate sodium restriction and increased consumption of fresh fruit, vegetables, and low fat dairy, alcohol moderation, and smoking cessation.;Monitor prescription use compliance.   Expected Outcomes Short Term: Continued assessment and intervention until BP is < 140/67mm HG in hypertensive participants. < 130/58mm HG in hypertensive participants with diabetes, heart failure or chronic kidney disease.;Long Term: Maintenance of blood pressure at goal levels.      Core Components/Risk Factors/Patient Goals Review:    Core Components/Risk Factors/Patient Goals at Discharge (Final Review):    ITP Comments:     ITP Comments    Row Name 04/12/16 1556           ITP Comments Mr Dipasquale will not be able to start his first exercise session today. He is having right knee pain and has an appointment with Dr Monday tomorrow          Comments: 30 day note review

## 2016-04-17 NOTE — Progress Notes (Signed)
Pulmonary Individual Treatment Plan  Patient Details  Name: Mario [name] MRN: [id number] Date of Birth: Jan 31, 1969 Referring Provider:   April Manson Pulmonary Rehab from 04/04/2016 in [facility] Cardiac and Pulmonary Rehab  Referring Provider  [place]      Initial Encounter Date:  Flowsheet Row Pulmonary Rehab from 04/04/2016 in [facility] Cardiac and Pulmonary Rehab  Date  04/04/16  Referring Provider  Thies      Visit Diagnosis: COPD, mild ([place])  Patient's Home Medications on Admission:  Current Outpatient Prescriptions:    albuterol (PROVENTIL HFA;VENTOLIN HFA) 108 (90 Base) MCG/ACT inhaler, Inhale 2 puffs into the lungs as needed., Disp: , Rfl:    amLODipine (NORVASC) 5 MG tablet, Take 1 tablet by mouth daily., Disp: , Rfl:    diclofenac sodium (VOLTAREN) 1 % GEL, Apply 1 application topically 4 (four) times daily as needed., Disp: , Rfl:    edoxaban (SAVAYSA) 60 MG TABS tablet, Take 60 mg by mouth daily. cardiology, Disp: , Rfl:    fluticasone-salmeterol (ADVAIR HFA) 230-21 MCG/ACT inhaler, Inhale 2 puffs into the lungs 2 (two) times daily., Disp: , Rfl:    furosemide (LASIX) 80 MG tablet, Take 1 tablet by mouth daily., Disp: , Rfl:    gabapentin (NEURONTIN) 300 MG capsule, Take 1 capsule by mouth 3 (three) times daily., Disp: , Rfl:    glucose blood (ONE TOUCH ULTRA TEST) test strip, Use as instructed FOR TESTING three times daily.  E11.9, Disp: , Rfl:    ipratropium-albuterol (DUONEB) 0.5-2.5 (3) MG/3ML SOLN, Inhale into the lungs., Disp: , Rfl:    losartan (COZAAR) 50 MG tablet, Take 1 tablet by mouth daily., Disp: , Rfl:    metFORMIN (GLUCOPHAGE) 1000 MG tablet, Take 1 tablet by mouth 2 (two) times daily., Disp: , Rfl:    sotalol (BETAPACE) 80 MG tablet, Take 1 tablet by mouth 2 (two) times daily., Disp: , Rfl:    terazosin (HYTRIN) 5 MG capsule, Take 1 capsule by mouth every other day., Disp: , Rfl:    umeclidinium bromide (INCRUSE ELLIPTA) 62.5 MCG/INH AEPB, Inhale 1  puff into the lungs daily., Disp: , Rfl:   Past Medical History: Past Medical History:  Diagnosis Date   COPD (chronic obstructive pulmonary disease) ([place])    Diabetes mellitus without complication ([place])    Diabetic neuropathy ([place])    Hypertension     Tobacco Use: History  Smoking Status   Former Smoker  Smokeless Tobacco   Never Used    Labs: 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 Cardiac and Pulmonary Rehab Latest Ref Rng & Units 02/15/2016   Hemoglobin A1c 4.8 - 5.6 % 6.8(H)       ADL UCSD:     Pulmonary Assessment Scores    Row Name 04/04/16 1253         ADL UCSD   ADL Phase Entry     SOB Score total 40     Rest 0     Walk 3     Stairs 4     Bath 1     Dress 0     Shop 2        Pulmonary Function Assessment:     Pulmonary Function Assessment - 04/04/16 1252      Pulmonary Function Tests   FVC% 34 %   FEV1% 35 %   FEV1/FVC Ratio 79.3     Initial Spirometry Results   Comments Test date 04/04/16     Breath  Bilateral Breath Sounds Decreased;Clear   Shortness of Breath Yes;Limiting activity;Fear of Shortness of Breath      Exercise Target Goals:    Exercise Program Goal: Individual exercise prescription set with THRR, safety & activity barriers. Participant demonstrates ability to understand and report RPE using BORG scale, to self-measure pulse accurately, and to acknowledge the importance of the exercise prescription.  Exercise Prescription Goal: Starting with aerobic activity 30 plus minutes a day, 3 days per week for initial exercise prescription. Provide home exercise prescription and guidelines that participant acknowledges understanding prior to discharge.  Activity Barriers & Risk Stratification:     Activity Barriers & Cardiac Risk Stratification - 04/04/16 1251      Activity Barriers & Cardiac Risk Stratification   Activity Barriers Arthritis;Shortness of Breath;Deconditioning;Joint Problems   Cardiac Risk  Stratification Moderate      6 Minute Walk:     6 Minute Walk    Row Name 04/04/16 1241         6 Minute Walk   Distance 1035 feet     Walk Time 6 minutes     # of Rest Breaks 0     MPH 1.96     METS 2.53     RPE 11     Perceived Dyspnea  2     VO2 Peak 8.65     Symptoms No     Resting HR 66 bpm     Resting BP 130/82     Max Ex. HR 122 bpm     Max Ex. BP 158/84       Interval HR   Baseline HR 66     1 Minute HR 94     2 Minute HR 96     3 Minute HR 110     4 Minute HR 100     5 Minute HR 122     6 Minute HR 84     2 Minute Post HR 69     Interval Heart Rate? Yes       Interval Oxygen   Interval Oxygen? Yes     Baseline Oxygen Saturation % 94 %     Baseline Liters of Oxygen 2 L     1 Minute Oxygen Saturation % 89 %     1 Minute Liters of Oxygen 2 L     2 Minute Oxygen Saturation % 96 %     2 Minute Liters of Oxygen 2 L     3 Minute Oxygen Saturation % 86 %     3 Minute Liters of Oxygen 2 L     4 Minute Oxygen Saturation % 86 %     4 Minute Liters of Oxygen 2 L     5 Minute Oxygen Saturation % 85 %     5 Minute Liters of Oxygen 2 L     6 Minute Oxygen Saturation % 83 %     6 Minute Liters of Oxygen 2 L     2 Minute Post Oxygen Saturation % 95 %     2 Minute Post Liters of Oxygen 2 L        Initial Exercise Prescription:     Initial Exercise Prescription - 04/04/16 1200      Date of Initial Exercise RX and Referring Provider   Date 04/04/16   Referring Provider Thies     Oxygen   Oxygen Continuous   Liters 2     Treadmill   MPH 1.8  Grade 0   Minutes 15   METs 2.38     NuStep   Level 3   Minutes 15   METs 2.3     Recumbant Elliptical   Level 1   RPM 50   Minutes 15   METs 2.3     T5 Nustep   Level 2   Minutes 15   METs 2.3     Biostep-RELP   Level 3   Minutes 15   METs 2.3     Prescription Details   Frequency (times per week) 3   Duration Progress to 45 minutes of aerobic exercise without signs/symptoms of physical  distress     Intensity   THRR 40-80% of Max Heartrate 100-134   Ratings of Perceived Exertion 11-13   Perceived Dyspnea 0-4     Progression   Progression Continue to progress workloads to maintain intensity without signs/symptoms of physical distress.     Resistance Training   Training Prescription Yes   Weight 3   Reps 10-15      Perform Capillary Blood Glucose checks as needed.  Exercise Prescription Changes:     Exercise Prescription Changes    Row Name 04/17/16 1200             Response to Exercise   Blood Pressure (Admit) 150/80       Blood Pressure (Exercise) 152/74       Blood Pressure (Exit) 116/58       Heart Rate (Admit) 59 bpm       Heart Rate (Exercise) 133 bpm       Heart Rate (Exit) 54 bpm       Oxygen Saturation (Admit) 94 %       Oxygen Saturation (Exercise) 88 %       Oxygen Saturation (Exit) 97 %       Rating of Perceived Exertion (Exercise) 12       Perceived Dyspnea (Exercise) 2       Duration Progress to 45 minutes of aerobic exercise without signs/symptoms of physical distress       Intensity THRR unchanged         Progression   Progression Continue to progress workloads to maintain intensity without signs/symptoms of physical distress.         Resistance Training   Training Prescription Yes       Weight 3  moving to 5 lb - 3 "too easy"       Reps 10-15         Oxygen   Oxygen Continuous       Liters 2         Treadmill   MPH 1.8       Grade 0       Minutes 15       METs 2.38         Recumbant Elliptical   Level 1       RPM 50       Minutes 15         T5 Nustep   Level 2       Minutes 15          Exercise Comments:     Exercise Comments    Row Name 04/17/16 1100 04/17/16 1236         Exercise Comments First full day of exercise!  Patient was oriented to gym and equipment including functions, settings, policies, and procedures.  Patient's individual exercise prescription and treatment  plan were reviewed.  All  starting workloads were established based on the results of the 6 minute walk test done at initial orientation visit.  The plan for exercise progression was also introduced and progression will be customized based on patient's performance and goals. First full day of exercise!  Patient was oriented to gym and equipment including functions, settings, policies, and procedures.  Patient's individual exercise prescription and treatment plan were reviewed.  All starting workloads were established based on the results of the 6 minute walk test done at initial orientation visit.  The plan for exercise progression was also introduced and progression will be customized based on patient's performance and goals.         Discharge Exercise Prescription (Final Exercise Prescription Changes):     Exercise Prescription Changes - 04/17/16 1200      Response to Exercise   Blood Pressure (Admit) 150/80   Blood Pressure (Exercise) 152/74   Blood Pressure (Exit) 116/58   Heart Rate (Admit) 59 bpm   Heart Rate (Exercise) 133 bpm   Heart Rate (Exit) 54 bpm   Oxygen Saturation (Admit) 94 %   Oxygen Saturation (Exercise) 88 %   Oxygen Saturation (Exit) 97 %   Rating of Perceived Exertion (Exercise) 12   Perceived Dyspnea (Exercise) 2   Duration Progress to 45 minutes of aerobic exercise without signs/symptoms of physical distress   Intensity THRR unchanged     Progression   Progression Continue to progress workloads to maintain intensity without signs/symptoms of physical distress.     Resistance Training   Training Prescription Yes   Weight 3  moving to 5 lb - 3 "too easy"   Reps 10-15     Oxygen   Oxygen Continuous   Liters 2     Treadmill   MPH 1.8   Grade 0   Minutes 15   METs 2.38     Recumbant Elliptical   Level 1   RPM 50   Minutes 15     T5 Nustep   Level 2   Minutes 15       Nutrition:  Target Goals: Understanding of nutrition guidelines, daily intake of sodium <1546m,  cholesterol <2062m calories 30% from fat and 7% or less from saturated fats, daily to have 5 or more servings of fruits and vegetables.  Biometrics:     Pre Biometrics - 04/04/16 1241      Pre Biometrics   Height _0  (1.778 m)   Weight 249 lb 9.6 oz (113.2 kg)   Waist Circumference 51.75 inches   Hip Circumference 43 inches   Waist to Hip Ratio 1.2 %   BMI (Calculated) 35.9       Nutrition Therapy Plan and Nutrition Goals:   Nutrition Discharge: Rate Your Plate Scores:   Psychosocial: Target Goals: Acknowledge presence or absence of depression, maximize coping skills, provide positive support system. Participant is able to verbalize types and ability to use techniques and skills needed for reducing stress and depression.  Initial Review & Psychosocial Screening:     Initial Psych Review & Screening - 04/04/16 [id number]   Comments Mr BuReinosoas good support from his wife. He hopes to be more active. He was very athletic in the past and misses being fit. [place], but Mr BuLuginbills looking forward to participating in [place]  Barriers   Psychosocial barriers to participate in program The patient should benefit from training in stress management and relaxation.     Screening Interventions   Interventions Encouraged to exercise;Program counselor consult      Quality of Life Scores:     Quality of Life - 04/04/16 1320      Quality of Life Scores   Health/Function Pre 19.63 %   Socioeconomic Pre 20.71 %   Psych/Spiritual Pre 21 %   Family Pre 21 %   GLOBAL Pre 20.31 %      PHQ-9: Recent Review Flowsheet Data    Depression screen [facility] 2/9 04/04/2016 02/15/2016   Decreased Interest 2 0   Down, Depressed, Hopeless 2 0   PHQ - 2 Score 4 0   Altered sleeping 1 -   Tired, decreased energy 3 -   Change in appetite 1 -   Feeling  bad or failure about yourself  0 -   Trouble concentrating 1 -   Moving slowly or fidgety/restless 0 -   Suicidal thoughts 0 -   PHQ-9 Score 10 -   Difficult doing work/chores Somewhat difficult -      Psychosocial Evaluation and Intervention:     Psychosocial Evaluation - 04/17/16 1118      Psychosocial Evaluation & Interventions   Interventions Stress management education;Relaxation education;Encouraged to exercise with the program and follow exercise prescription   Comments Counselor met with Mr. Michai Dieppa") today for initial psychosocial evaluation.  He will be 70 years old later this month and is retired.  He is in this program due to COPD diagnosis approx. 4 years ago.   Nydia Bouton has a strong support system being the oldest of 7 children; living with his girlfriend; and has lots of relatives who live closeby.  In addition to COPD, he has knee problems; diabetes; and chronic sleep problems.  He states his sleep is intermittent, but he is likely getting at least 6 or more hours per night.  He uses Oxygen at bedtime only.  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  In discussing this with [name], he states he's not depressed only feels a sense of "hopelessness" at times with his current health condition.  His mood currently is positive and states that is typical.  He has minimal stress in his life, although he just moved here from [place] recently.  He has goals to breathe better and increase his stamina so he will be less tired while in this program.  Staff will be following with him throughout the course of this program.        Psychosocial Re-Evaluation:  Education: Education Goals: Education classes will be provided on a weekly basis, covering required topics. Participant will state understanding/return demonstration of topics presented.  Learning Barriers/Preferences:     Learning Barriers/Preferences - 04/04/16 1251       Learning Barriers/Preferences   Learning Barriers None   Learning Preferences None      Education Topics: Initial Evaluation Education: - Verbal, written and demonstration of respiratory meds, RPE/PD scales, oximetry and breathing techniques. Instruction on use of nebulizers and MDIs: cleaning and proper use, rinsing mouth with steroid doses and importance of monitoring MDI activations. Flowsheet Row Pulmonary Rehab from 04/17/2016 in [facility] Cardiac and Pulmonary Rehab  Date  04/04/16  Educator  LB  Instruction Review Code  2- meets goals/outcomes      General Nutrition Guidelines/Fats and Fiber: -Group  instruction provided by verbal, written material, models and posters to present the general guidelines for heart healthy nutrition. Gives an explanation and review of dietary fats and fiber.   Controlling Sodium/Reading Food Labels: -Group verbal and written material supporting the discussion of sodium use in heart healthy nutrition. Review and explanation with models, verbal and written materials for utilization of the food label.   Exercise Physiology & Risk Factors: - Group verbal and written instruction with models to review the exercise physiology of the cardiovascular system and associated critical values. Details cardiovascular disease risk factors and the goals associated with each risk factor.   Aerobic Exercise & Resistance Training: - Gives group verbal and written discussion on the health impact of inactivity. On the components of aerobic and resistive training programs and the benefits of this training and how to safely progress through these programs.   Flexibility, Balance, General Exercise Guidelines: - Provides group verbal and written instruction on the benefits of flexibility and balance training programs. Provides general exercise guidelines with specific guidelines to those with heart or lung disease. Demonstration and skill practice provided.   Stress  Management: - Provides group verbal and written instruction about the health risks of elevated stress, cause of high stress, and healthy ways to reduce stress.   Depression: - Provides group verbal and written instruction on the correlation between heart/lung disease and depressed mood, treatment options, and the stigmas associated with seeking treatment.   Exercise & Equipment Safety: - Individual verbal instruction and demonstration of equipment use and safety with use of the equipment. Flowsheet Row Pulmonary Rehab from 04/17/2016 in [facility] Cardiac and Pulmonary Rehab  Date  04/17/16  Educator  AS  Instruction Review Code  2- meets goals/outcomes      Infection Prevention: - Provides verbal and written material to individual with discussion of infection control including proper hand washing and proper equipment cleaning during exercise session. Flowsheet Row Pulmonary Rehab from 04/17/2016 in [facility] Cardiac and Pulmonary Rehab  Date  04/17/16  Educator  AS  Instruction Review Code  2- meets goals/outcomes      Falls Prevention: - Provides verbal and written material to individual with discussion of falls prevention and safety. Flowsheet Row Pulmonary Rehab from 04/17/2016 in [facility] Cardiac and Pulmonary Rehab  Date  04/04/16  Educator  LB  Instruction Review Code  2- meets goals/outcomes      Diabetes: - Individual verbal and written instruction to review signs/symptoms of diabetes, desired ranges of glucose level fasting, after meals and with exercise. Advice that pre and post exercise glucose checks will be done for 3 sessions at entry of program. Flowsheet Row Pulmonary Rehab from 04/17/2016 in [facility] Cardiac and Pulmonary Rehab  Date  04/04/16  Educator  LB  Instruction Review Code  2- meets goals/outcomes      Chronic Lung Diseases: - Group verbal and written instruction to review new updates, new respiratory medications, new advancements in procedures and treatments.  Provide informative websites and "800" numbers of self-education.   Lung Procedures: - Group verbal and written instruction to describe testing methods done to diagnose lung disease. Review the outcome of test results. Describe the treatment choices: Pulmonary Function Tests, ABGs and oximetry.   Energy Conservation: - Provide group verbal and written instruction for methods to conserve energy, plan and organize activities. Instruct on pacing techniques, use of adaptive equipment and posture/positioning to relieve shortness of breath.   Triggers: - Group verbal and written instruction to review types of environmental  controls: home humidity, furnaces, filters, dust mite/pet prevention, HEPA vacuums. To discuss weather changes, air quality and the benefits of nasal washing.   Exacerbations: - Group verbal and written instruction to provide: warning signs, infection symptoms, calling MD promptly, preventive modes, and value of vaccinations. Review: effective airway clearance, coughing and/or vibration techniques. Create an Sports administrator.   Oxygen: - Individual and group verbal and written instruction on oxygen therapy. Includes supplement oxygen, available portable oxygen systems, continuous and intermittent flow rates, oxygen safety, concentrators, and Medicare reimbursement for oxygen. Flowsheet Row Pulmonary Rehab from 04/17/2016 in [facility] Cardiac and Pulmonary Rehab  Date  04/04/16  Educator  LB  Instruction Review Code  2- meets goals/outcomes      Respiratory Medications: - Group verbal and written instruction to review medications for lung disease. Drug class, frequency, complications, importance of spacers, rinsing mouth after steroid MDI's, and proper cleaning methods for nebulizers.   AED/CPR: - Group verbal and written instruction with the use of models to demonstrate the basic use of the AED with the basic ABC's of resuscitation.   Breathing Retraining: - Provides  individuals verbal and written instruction on purpose, frequency, and proper technique of diaphragmatic breathing and pursed-lipped breathing. Applies individual practice skills. Flowsheet Row Pulmonary Rehab from 04/17/2016 in [facility] Cardiac and Pulmonary Rehab  Date  04/17/16  Educator  AS  Instruction Review Code  2- meets goals/outcomes      Anatomy and Physiology of the Lungs: - Group verbal and written instruction with the use of models to provide basic lung anatomy and physiology related to function, structure and complications of lung disease.   Heart Failure: - Group verbal and written instruction on the basics of heart failure: signs/symptoms, treatments, explanation of ejection fraction, enlarged heart and cardiomyopathy.   Sleep Apnea: - Individual verbal and written instruction to review Obstructive Sleep Apnea. Review of risk factors, methods for diagnosing and types of masks and machines for OSA.   Anxiety: - Provides group, verbal and written instruction on the correlation between heart/lung disease and anxiety, treatment options, and management of anxiety.   Relaxation: - Provides group, verbal and written instruction about the benefits of relaxation for patients with heart/lung disease. Also provides patients with examples of relaxation techniques.   Knowledge Questionnaire Score:     Knowledge Questionnaire Score - 04/04/16 1251      Knowledge Questionnaire Score   Pre Score 6/10       Core Components/Risk Factors/Patient Goals at Admission:     Personal Goals and Risk Factors at Admission - 04/04/16 1301      Core Components/Risk Factors/Patient Goals on Admission    Weight Management Yes;Weight Loss   Intervention Weight Management: Develop a combined nutrition and exercise program designed to reach desired caloric intake, while maintaining appropriate intake of nutrient and fiber, sodium and fats, and appropriate energy expenditure required for the  weight goal.;Weight Management: Provide education and appropriate resources to help participant work on and attain dietary goals.;Weight Management/Obesity: Establish reasonable short term and long term weight goals.   Admit Weight 249 lb 9.6 oz (113.2 kg)   Goal Weight: Short Term 240 lb (108.9 kg)   Goal Weight: Long Term 200 lb (90.7 kg)   Expected Outcomes Short Term: Continue to assess and modify interventions until short term weight is achieved;Long Term: Adherence to nutrition and physical activity/exercise program aimed toward attainment of established weight goal;Weight Maintenance: Understanding of the daily nutrition guidelines, which includes 25-35% calories from fat, 7%  or less cal from saturated fats, less than '200mg'$  cholesterol, less than 1.5gm of sodium, & 5 or more servings of fruits and vegetables daily;Weight Loss: Understanding of general recommendations for a balanced deficit meal plan, which promotes 1-2 lb weight loss per week and includes a negative energy balance of [phone] kcal/d;Understanding recommendations for meals to include 15-35% energy as protein, 25-35% energy from fat, 35-60% energy from carbohydrates, less than '200mg'$  of dietary cholesterol, 20-35 gm of total fiber daily;Understanding of distribution of calorie intake throughout the day with the consumption of 4-5 meals/snacks   Sedentary Yes  Planning to join [employer] with wife.   Intervention Provide advice, education, support and counseling about physical activity/exercise needs.;Develop an individualized exercise prescription for aerobic and resistive training based on initial evaluation findings, risk stratification, comorbidities and participant's personal goals.   Expected Outcomes Achievement of increased cardiorespiratory fitness and enhanced flexibility, muscular endurance and strength shown through measurements of functional capacity and personal statement of participant.   Increase Strength and Stamina  Yes   Intervention Provide advice, education, support and counseling about physical activity/exercise needs.;Develop an individualized exercise prescription for aerobic and resistive training based on initial evaluation findings, risk stratification, comorbidities and participant's personal goals.   Expected Outcomes Achievement of increased cardiorespiratory fitness and enhanced flexibility, muscular endurance and strength shown through measurements of functional capacity and personal statement of participant.   Improve shortness of breath with ADL's Yes   Intervention Provide education, individualized exercise plan and daily activity instruction to help decrease symptoms of SOB with activities of daily living.   Expected Outcomes Short Term: Achieves a reduction of symptoms when performing activities of daily living.   Develop more efficient breathing techniques such as purse lipped breathing and diaphragmatic breathing; and practicing self-pacing with activity Yes   Intervention Provide education, demonstration and support about specific breathing techniuqes utilized for more efficient breathing. Include techniques such as pursed lipped breathing, diaphragmatic breathing and self-pacing activity.   Expected Outcomes Short Term: Participant will be able to demonstrate and use breathing techniques as needed throughout daily activities.   Increase knowledge of respiratory medications and ability to use respiratory devices properly  Yes  4l/m oxygen for sleep; Albuterol, Advair, Incruse Ellipta, Duoneb SVN   Intervention Provide education and demonstration as needed of appropriate use of medications, inhalers, and oxygen therapy.   Expected Outcomes Short Term: Achieves understanding of medications use. Understands that oxygen is a medication prescribed by physician. Demonstrates appropriate use of inhaler and oxygen therapy.   Diabetes Yes   Intervention Provide education about signs/symptoms and  action to take for hypo/hyperglycemia.;Provide education about proper nutrition, including hydration, and aerobic/resistive exercise prescription along with prescribed medications to achieve blood glucose in normal ranges: Fasting glucose 65-99 mg/dL   Expected Outcomes Short Term: Participant verbalizes understanding of the signs/symptoms and immediate care of hyper/hypoglycemia, proper foot care and importance of medication, aerobic/resistive exercise and nutrition plan for blood glucose control.;Long Term: Attainment of HbA1C < 7%.   Heart Failure Yes   Intervention Provide a combined exercise and nutrition program that is supplemented with education, support and counseling about heart failure. Directed toward relieving symptoms such as shortness of breath, decreased exercise tolerance, and extremity edema.   Expected Outcomes Improve functional capacity of life;Short term: Attendance in program 2-3 days a week with increased exercise capacity. Reported lower sodium intake. Reported increased fruit and vegetable intake. Reports medication compliance.;Short term: Daily weights obtained and reported for increase. Utilizing diuretic protocols set  by physician.;Long term: Adoption of self-care skills and reduction of barriers for early signs and symptoms recognition and intervention leading to self-care maintenance.   Hypertension Yes   Intervention Provide education on lifestyle modifcations including regular physical activity/exercise, weight management, moderate sodium restriction and increased consumption of fresh fruit, vegetables, and low fat dairy, alcohol moderation, and smoking cessation.;Monitor prescription use compliance.   Expected Outcomes Short Term: Continued assessment and intervention until BP is < 140/40m HG in hypertensive participants. < 130/843mHG in hypertensive participants with diabetes, heart failure or chronic kidney disease.;Long Term: Maintenance of blood pressure at goal levels.       Core Components/Risk Factors/Patient Goals Review:      Goals and Risk Factor Review    Row Name 04/17/16 1058             Core Components/Risk Factors/Patient Goals Review   Personal Goals Review Develop more efficient breathing techniques such as purse lipped breathing and diaphragmatic breathing and practicing self-pacing with activity.       Review Pused lip breathing was discussed with the patient. He demonstrated understanding of this technique.        Expected Outcomes Patient will use PLB during exercise and ADL's to help contol SOB.           Core Components/Risk Factors/Patient Goals at Discharge (Final Review):      Goals and Risk Factor Review - 04/17/16 1058      Core Components/Risk Factors/Patient Goals Review   Personal Goals Review Develop more efficient breathing techniques such as purse lipped breathing and diaphragmatic breathing and practicing self-pacing with activity.   Review Pused lip breathing was discussed with the patient. He demonstrated understanding of this technique.    Expected Outcomes Patient will use PLB during exercise and ADL's to help contol SOB.       ITP Comments:     ITP Comments    Row Name 04/12/16 1556           ITP Comments Mr BuHuntill not be able to start his first exercise session today. He is having right knee pain and has an appointment with Dr Monday tomorrow          Comments: 30 day note review

## 2016-04-17 NOTE — Progress Notes (Signed)
Daily Session Note  Patient Details  Name: Mario [name] MRN: [id number] Date of Birth: 01/18/1946 Referring Provider:   April Manson Pulmonary Rehab from 04/04/2016 in [facility] Cardiac and Pulmonary Rehab  Referring Provider  Thies      Encounter Date: 04/17/2016  Check In:     Session Check In - 04/17/16 1055      Check-In   Location [facility]-Cardiac & Pulmonary Rehab   Staff Present Nyoka Cowden, RN, BSN, [place], BS, ACSM CEP, Exercise Physiologist;Dione Mccombie Oletta Darter, [place], ACSM CEP, Exercise Physiologist;Laureen Janell Quiet, RRT, Respiratory Therapist   Supervising physician immediately available to respond to emergencies [name] immediately available ER MD   Physician(s) Darl Householder and Paduchowski   Medication changes reported     No   Fall or balance concerns reported    No   Warm-up and Cool-down Performed on first and last piece of equipment   Resistance Training Performed Yes   VAD Patient? No     Pain Assessment   Currently in Pain? Yes  no change in chronic pain   Multiple Pain Sites Yes           Exercise Prescription Changes - 04/17/16 1200      Response to Exercise   Blood Pressure (Admit) 150/80   Blood Pressure (Exercise) 152/74   Blood Pressure (Exit) 116/58   Heart Rate (Admit) 59 bpm   Heart Rate (Exercise) 133 bpm   Heart Rate (Exit) 54 bpm   Oxygen Saturation (Admit) 94 %   Oxygen Saturation (Exercise) 88 %   Oxygen Saturation (Exit) 97 %   Rating of Perceived Exertion (Exercise) 12   Perceived Dyspnea (Exercise) 2   Duration Progress to 45 minutes of aerobic exercise without signs/symptoms of physical distress   Intensity THRR unchanged     Progression   Progression Continue to progress workloads to maintain intensity without signs/symptoms of physical distress.     Resistance Training   Training Prescription Yes   Weight 3  moving to 5 lb - 3 "too easy"   Reps 10-15     Oxygen   Oxygen Continuous   Liters 2     Treadmill   MPH 1.8   Grade 0   Minutes 15   METs 2.38     Recumbant Elliptical   Level 1   RPM 50   Minutes 15     T5 Nustep   Level 2   Minutes 15      Goals Met:  Proper associated with RPD/PD & O2 Sat Exercise tolerated well Strength training completed today  Goals Unmet:  Not Applicable  Comments: First full day of exercise!  Patient was oriented to gym and equipment including functions, settings, policies, and procedures.  Patient's individual exercise prescription and treatment plan were reviewed.  All starting workloads were established based on the results of the 6 minute walk test done at initial orientation visit.  The plan for exercise progression was also introduced and progression will be customized based on patient's performance and goals.    Dr. Emily Filbert is Medical Director for [place] and [name].

## 2016-04-19 DIAGNOSIS — J449 Chronic obstructive pulmonary disease, unspecified: Secondary | ICD-10-CM

## 2016-04-19 LAB — GLUCOSE, CAPILLARY
Glucose-Capillary: 188 mg/dL — ABNORMAL HIGH (ref 65–99)
Glucose-Capillary: 156 mg/dL — ABNORMAL HIGH (ref 65–99)

## 2016-04-19 NOTE — Progress Notes (Signed)
Daily Session Note  Patient Details  Name: Mario [name] MRN: [id number] Date of Birth: [birth year] Referring Provider:   April Manson Pulmonary Rehab from 04/04/2016 in [facility] Cardiac and Pulmonary Rehab  Referring Provider  Thies      Encounter Date: 04/19/2016  Check In:     Session Check In - 04/19/16 1052      Check-In   Location [facility]-Cardiac & Pulmonary Rehab   Staff Present Alberteen Sam, MA, ACSM RCEP, Exercise Physiologist;Amanda Oletta Darter, BA, ACSM CEP, Exercise Physiologist;Laureen Owens Shark, BS, RRT, Respiratory Therapist   Supervising physician immediately available to respond to emergencies [name] immediately available ER MD   Physician(s) Clearnce Hasten and Alfred Levins   Medication changes reported     No   Fall or balance concerns reported    No   Warm-up and Cool-down Performed as group-led Location manager Performed Yes   VAD Patient? No     Pain Assessment   Currently in Pain? No/denies         Goals Met:  Proper associated with RPD/PD & O2 Sat Independence with exercise equipment Exercise tolerated well Strength training completed today  Goals Unmet:  Not Applicable  Comments: Pt able to follow exercise prescription today without complaint.  Will continue to monitor for progression.    Dr. Emily Filbert is Medical Director for [place] and [name].

## 2016-04-26 ENCOUNTER — Encounter: Payer: Medicare Other | Admitting: *Deleted

## 2016-04-26 DIAGNOSIS — J449 Chronic obstructive pulmonary disease, unspecified: Secondary | ICD-10-CM

## 2016-04-26 LAB — GLUCOSE, CAPILLARY
GLUCOSE-CAPILLARY: 129 mg/dL — AB (ref 65–99)
Glucose-Capillary: 189 mg/dL — ABNORMAL HIGH (ref 65–99)

## 2016-04-26 NOTE — Progress Notes (Signed)
Daily Session Note  Patient Details  Name: Mario [name] MRN: [id number] Date of Birth: 1946-02-01 Referring Provider:   April Manson Pulmonary Rehab from 04/04/2016 in [facility] Cardiac and Pulmonary Rehab  Referring Provider  Thies      Encounter Date: 04/26/2016  Check In:     Session Check In - 04/26/16 1220      Check-In   Location [facility]-Cardiac & Pulmonary Rehab   Staff Present Carson Myrtle, BS, RRT, Respiratory Lennie Hummer, MA, ACSM RCEP, Exercise Physiologist;Kristof Nadeem RN BSN   Supervising physician immediately available to respond to emergencies [name] immediately available ER MD   Physician(s) Drs. Archie Balboa and [employer]   Medication changes reported     No   Fall or balance concerns reported    No   Warm-up and Cool-down Performed as group-led Location manager Performed Yes   VAD Patient? No     Pain Assessment   Currently in Pain? No/denies         Goals Met:  Proper associated with RPD/PD & O2 Sat Independence with exercise equipment Using PLB without cueing & demonstrates good technique Exercise tolerated well No report of cardiac concerns or symptoms Strength training completed today  Goals Unmet:  Not Applicable  Comments: Pt able to follow exercise prescription today without complaint.  Will continue to monitor for progression.    Dr. Emily Filbert is Medical Director for [place] and [name].

## 2016-05-03 ENCOUNTER — Encounter: Payer: Medicare Other | Attending: Internal Medicine

## 2016-05-03 DIAGNOSIS — J449 Chronic obstructive pulmonary disease, unspecified: Secondary | ICD-10-CM | POA: Insufficient documentation

## 2016-05-03 NOTE — Progress Notes (Signed)
Daily Session Note  Patient Details  Name: Arno Cullers MRN: [id number] Date of Birth: 1945/07/05 Referring Provider:   April Manson Pulmonary Rehab from 04/04/2016 in [facility] Cardiac and Pulmonary Rehab  Referring Provider  Thies      Encounter Date: 05/03/2016  Check In:     Session Check In - 05/03/16 1058      Check-In   Location [facility]-Cardiac & Pulmonary Rehab   Staff Present Carson Myrtle, BS, RRT, Respiratory Lennie Hummer, MA, ACSM RCEP, Exercise Physiologist;Lottie Siska Oletta Darter, BA, ACSM CEP, Exercise Physiologist   Supervising physician immediately available to respond to emergencies [name] immediately available ER MD   Physician(s) Clearnce Hasten and Joni Fears   Medication changes reported     No   Fall or balance concerns reported    No   Warm-up and Cool-down Performed on first and last piece of equipment   Resistance Training Performed Yes   VAD Patient? No     Pain Assessment   Currently in Pain? No/denies         Goals Met:  Proper associated with RPD/PD & O2 Sat Independence with exercise equipment Exercise tolerated well Strength training completed today  Goals Unmet:  Not Applicable  Comments: Pt able to follow exercise prescription today without complaint.  Will continue to monitor for progression.    Dr. Emily Filbert is Medical Director for [place] and [name].

## 2016-05-05 ENCOUNTER — Telehealth: Payer: Self-pay
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
## 2016-05-08 ENCOUNTER — Telehealth: Payer: Self-pay | Admitting: *Deleted

## 2016-05-08 ENCOUNTER — Encounter: Payer: Self-pay | Admitting: *Deleted

## 2016-05-08 ENCOUNTER — Encounter: Payer: Self-pay | Admitting: Respiratory Therapy

## 2016-05-08 ENCOUNTER — Telehealth: Payer: Self-pay | Admitting: Respiratory Therapy

## 2016-05-08 DIAGNOSIS — J449 Chronic obstructive pulmonary disease, unspecified: Secondary | ICD-10-CM
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
## 2016-05-08 NOTE — Telephone Encounter (Signed)
Mr Mario [name] called and will be out today from [name] due to illness.

## 2016-05-10 ENCOUNTER — Encounter: Payer: Self-pay | Admitting: Respiratory Therapy

## 2016-05-10 NOTE — Progress Notes (Signed)
Mario [name] psychosocial assessment reveals no barriers at this time to participation in Pulmonary Rehab.  He  has good family and friend support that encourages Mario [name] to participate in [name] and progress with His goals.  Mario [name] concerns are monitored, but he  has acknowledge that attending the program has helped to maintain quality life with improved mobility, self-care, and emotional and financial stability.  Mario [name] is commended for regular attendance and self-motivation to improve His pulmonary disease management.

## 2016-05-15 ENCOUNTER — Encounter: Payer: Self-pay | Admitting: Respiratory Therapy

## 2016-05-15 DIAGNOSIS — J449 Chronic obstructive pulmonary disease, unspecified: Secondary | ICD-10-CM
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
## 2016-05-23 ENCOUNTER — Encounter: Payer: Self-pay | Admitting: Respiratory Therapy

## 2016-05-23 DIAGNOSIS — J449 Chronic obstructive pulmonary disease, unspecified: Secondary | ICD-10-CM

## 2016-06-02 ENCOUNTER — Encounter: Payer: Medicare Other | Attending: Internal Medicine

## 2016-06-02 DIAGNOSIS — J449 Chronic obstructive pulmonary disease, unspecified: Secondary | ICD-10-CM | POA: Insufficient documentation

## 2016-06-12 ENCOUNTER — Encounter: Payer: Self-pay | Admitting: Respiratory Therapy

## 2016-06-12 DIAGNOSIS — J449 Chronic obstructive pulmonary disease, unspecified: Secondary | ICD-10-CM
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
## 2016-06-27 ENCOUNTER — Telehealth: Payer: Self-pay | Admitting: Respiratory Therapy

## 2016-06-27 ENCOUNTER — Encounter: Payer: Self-pay | Admitting: Respiratory Therapy

## 2016-06-27 DIAGNOSIS — J449 Chronic obstructive pulmonary disease, unspecified: Secondary | ICD-10-CM

## 2016-06-27 NOTE — Telephone Encounter (Signed)
Called Mr Mario [name] on 06/12/16 and 06/27/16 to check on his absence from [name] - left message on phone with both calls; last attended 05/03/16.

## 2016-06-30 ENCOUNTER — Encounter: Payer: Medicare Other | Attending: Internal Medicine

## 2016-06-30 DIAGNOSIS — J449 Chronic obstructive pulmonary disease, unspecified: Secondary | ICD-10-CM | POA: Insufficient documentation

## 2016-07-04 NOTE — Telephone Encounter (Signed)
Mr Mario [name] will not be able to start his first exercise session today. He is having right knee pain and has an appointment with Dr Monday tomorrow.    By Alonza BogusLaureen M Brown

## 2016-07-04 NOTE — Telephone Encounter (Signed)
Error

## 2016-07-10 ENCOUNTER — Encounter: Payer: Self-pay | Admitting: Respiratory Therapy

## 2016-07-10 DIAGNOSIS — J449 Chronic obstructive pulmonary disease, unspecified: Secondary | ICD-10-CM
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
## 2016-07-18 ENCOUNTER — Encounter: Payer: Self-pay | Admitting: Respiratory Therapy

## 2016-07-24 ENCOUNTER — Encounter: Payer: Medicare Other | Admitting: *Deleted

## 2016-07-24 DIAGNOSIS — J449 Chronic obstructive pulmonary disease, unspecified: Secondary | ICD-10-CM

## 2016-07-24 NOTE — Progress Notes (Signed)
Daily Session Note  Patient Details  Name: Mario [name] MRN: [id number] Date of Birth: 06/12/1945 Referring Provider:     Pulmonary Rehab from 04/04/2016 in [facility]  Referring Provider  Thies      Encounter Date: 07/24/2016  Check In:     Session Check In - 07/24/16 1122      Check-In   Location [facility]   Staff Present Amanda Sommer, BA, ACSM CEP, Exercise Physiologist;Laureen Brown, BS, RRT, Respiratory Therapist;Kelly Hayes, BS, ACSM CEP, Exercise Physiologist   Supervising physician immediately available to respond to emergencies [name] immediately available ER MD   Physician(s) Schaevitz and Williams    Medication changes reported     No   Fall or balance concerns reported    No   Warm-up and Cool-down Performed as group-led instruction   Resistance Training Performed Yes   VAD Patient? No     Pain Assessment   Currently in Pain? No/denies         History  Smoking Status  . Former Smoker  Smokeless Tobacco  . Never Used    Goals Met:  Proper associated with RPD/PD & O2 Sat Independence with exercise equipment Exercise tolerated well Strength training completed today  Goals Unmet:  Not Applicable  Comments: Pt able to follow exercise prescription today without complaint.  Will continue to monitor for progression.    Dr. Mark Miller is Medical Director for [facility] and [name]. 

## 2016-07-26 DIAGNOSIS — J449 Chronic obstructive pulmonary disease, unspecified: Secondary | ICD-10-CM

## 2016-07-26 LAB — GLUCOSE, CAPILLARY: GLUCOSE-CAPILLARY: 135 mg/dL — AB (ref 65–99)

## 2016-07-26 NOTE — Progress Notes (Signed)
Daily Session Note  Patient Details  Name: Dayvion Sans MRN: [id number] Date of Birth: [birth year] Referring Provider:     Pulmonary Rehab from 04/04/2016 in [facility]  Referring Provider  Thies      Encounter Date: 07/26/2016  Check In:     Session Check In - 07/26/16 1151      Check-In   Location [facility]   Staff Present Carson Myrtle, BS, RRT, Respiratory Lennie Hummer, MA, ACSM RCEP, Exercise Physiologist;Kathleen Tamm Oletta Darter, BA, ACSM CEP, Exercise Physiologist   Supervising physician immediately available to respond to emergencies [name] immediately available ER MD   Physician(s) Ed felt light headed after the XR - BP 102/58 - after drinking water he felt better and resumed the session.   Medication changes reported     No   Fall or balance concerns reported    No   Warm-up and Cool-down Performed as group-led instruction   Resistance Training Performed Yes   VAD Patient? No     Pain Assessment   Currently in Pain? No/denies           Exercise Prescription Changes - 07/26/16 1100      Response to Exercise   Blood Pressure (Admit) 136/64   Blood Pressure (Exercise) 146/84   Blood Pressure (Exit) 116/70   Heart Rate (Admit) 88 bpm   Heart Rate (Exercise) 112 bpm   Heart Rate (Exit) 87 bpm   Oxygen Saturation (Admit) 95 %   Oxygen Saturation (Exercise) 87 %   Oxygen Saturation (Exit) 97 %   Rating of Perceived Exertion (Exercise) 13   Perceived Dyspnea (Exercise) 2   Duration Progress to 45 minutes of aerobic exercise without signs/symptoms of physical distress   Intensity THRR unchanged     Progression   Progression Continue to progress workloads to maintain intensity without signs/symptoms of physical distress.     Resistance Training   Training Prescription Yes   Weight 3   Reps 10-15     Oxygen   Oxygen Continuous   Liters 4     Treadmill   MPH 1.8   Grade 0   Minutes 15   METs 2.38     Recumbant Elliptical   Level 2   RPM 35   Minutes 15   METs 1.4     T5 Nustep   Level 2   Minutes 15   METs 1.9      History  Smoking Status  . Former Smoker  Smokeless Tobacco  . Never Used    Goals Met:  Proper associated with RPD/PD & O2 Sat Independence with exercise equipment Exercise tolerated well Strength training completed today  Goals Unmet:  Not Applicable  Comments: Pt able to follow exercise prescription today without complaint.  Will continue to monitor for progression.    Dr. Emily Filbert is Medical Director for [place] and [name].

## 2016-07-31 ENCOUNTER — Encounter: Payer: Medicare Other | Attending: Internal Medicine | Admitting: *Deleted

## 2016-07-31 DIAGNOSIS — J449 Chronic obstructive pulmonary disease, unspecified: Secondary | ICD-10-CM | POA: Insufficient documentation

## 2016-07-31 NOTE — Progress Notes (Signed)
Daily Session Note  Patient Details  Name: Mario [name] MRN: [id number] Date of Birth: 12/04/1945 Referring Provider:     Pulmonary Rehab from 04/04/2016 in [facility]  Referring Provider  Thies      Encounter Date: 07/31/2016  Check In:     Session Check In - 07/31/16 1202      Check-In   Location [facility]   Staff Present Carson Myrtle, BS, RRT, Respiratory Dareen Piano, BA, ACSM CEP, Exercise Physiologist;Rajinder Mesick Amedeo Plenty, BS, ACSM CEP, Exercise Physiologist   Supervising physician immediately available to respond to emergencies [name] immediately available ER MD   Physician(s) Quentin Cornwall and Jimmye Norman    Medication changes reported     Yes   Comments New medication. Injections for diabetes management. Did not have the name of the new medication with him but will bring it new session.    Fall or balance concerns reported    No   Warm-up and Cool-down Performed as group-led instruction   Resistance Training Performed Yes   VAD Patient? No     Pain Assessment   Currently in Pain? No/denies   Multiple Pain Sites No         History  Smoking Status  . Former Smoker  Smokeless Tobacco  . Never Used    Goals Met:  Proper associated with RPD/PD & O2 Sat Independence with exercise equipment Exercise tolerated well Strength training completed today  Goals Unmet:  Not applicable  Comments: Pt able to follow exercise prescription today without complaint.  Will continue to monitor for progression.    Dr. Emily Filbert is Medical Director for [place] and [name].

## 2016-08-07 ENCOUNTER — Encounter: Payer: Self-pay | Admitting: Respiratory Therapy

## 2016-08-07 DIAGNOSIS — J449 Chronic obstructive pulmonary disease, unspecified: Secondary | ICD-10-CM
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
## 2016-08-14 ENCOUNTER — Other Ambulatory Visit: Payer: Self-pay | Admitting: Nurse Practitioner

## 2016-08-14 DIAGNOSIS — R911 Solitary pulmonary nodule: Secondary | ICD-10-CM

## 2016-08-17 ENCOUNTER — Ambulatory Visit
Admission: RE | Admit: 2016-08-17 | Discharge: 2016-08-17 | Disposition: A | Discharge status: Home / Self Care | Payer: Medicare Other | Source: Ambulatory Visit | Attending: Nurse Practitioner | Admitting: Nurse Practitioner

## 2016-08-17 DIAGNOSIS — R918 Other nonspecific abnormal finding of lung field: Secondary | ICD-10-CM | POA: Diagnosis not present

## 2016-08-17 DIAGNOSIS — R911 Solitary pulmonary nodule: Secondary | ICD-10-CM | POA: Diagnosis present

## 2016-08-22 ENCOUNTER — Encounter: Payer: Self-pay | Admitting: Respiratory Therapy

## 2016-08-22 ENCOUNTER — Telehealth: Payer: Self-pay | Admitting: Respiratory Therapy

## 2016-08-22 DIAGNOSIS — J449 Chronic obstructive pulmonary disease, unspecified: Secondary | ICD-10-CM

## 2016-08-22 NOTE — Telephone Encounter (Signed)
Called Mr Mario [name] - last attended 07/31/16. Left a message.

## 2016-09-04 ENCOUNTER — Encounter: Payer: Self-pay | Admitting: Respiratory Therapy

## 2016-09-04 DIAGNOSIS — J449 Chronic obstructive pulmonary disease, unspecified: Secondary | ICD-10-CM

## 2016-09-04 NOTE — Progress Notes (Signed)
Pulmonary Individual Treatment Plan  Patient Details  Name: Mario [name] MRN: [id number] Date of Birth: 01/13/1946 Referring Provider:     Pulmonary Rehab from 04/04/2016 in [facility] Cardiac and Pulmonary Rehab  Referring Provider  Thies      Initial Encounter Date:    Pulmonary Rehab from 04/04/2016 in [facility] Cardiac and Pulmonary Rehab  Date  04/04/16  Referring Provider  Thies      Visit Diagnosis: COPD, mild ([place])  Patient's Home Medications on Admission:  Current Outpatient Prescriptions:    albuterol (PROVENTIL HFA;VENTOLIN HFA) 108 (90 Base) MCG/ACT inhaler, Inhale 2 puffs into the lungs as needed., Disp: , Rfl:    amLODipine (NORVASC) 5 MG tablet, Take 1 tablet by mouth daily., Disp: , Rfl:    diclofenac sodium (VOLTAREN) 1 % GEL, Apply 1 application topically 4 (four) times daily as needed., Disp: , Rfl:    edoxaban (SAVAYSA) 60 MG TABS tablet, Take 60 mg by mouth daily. cardiology, Disp: , Rfl:    fluticasone-salmeterol (ADVAIR HFA) 230-21 MCG/ACT inhaler, Inhale 2 puffs into the lungs 2 (two) times daily., Disp: , Rfl:    furosemide (LASIX) 80 MG tablet, Take 1 tablet by mouth daily., Disp: , Rfl:    gabapentin (NEURONTIN) 300 MG capsule, Take 1 capsule by mouth 3 (three) times daily., Disp: , Rfl:    glucose blood (ONE TOUCH ULTRA TEST) test strip, Use as instructed FOR TESTING three times daily.  E11.9, Disp: , Rfl:    ipratropium-albuterol (DUONEB) 0.5-2.5 (3) MG/3ML SOLN, Inhale into the lungs., Disp: , Rfl:    losartan (COZAAR) 50 MG tablet, Take 1 tablet by mouth daily., Disp: , Rfl:    metFORMIN (GLUCOPHAGE) 1000 MG tablet, Take 1 tablet by mouth 2 (two) times daily., Disp: , Rfl:    sotalol (BETAPACE) 80 MG tablet, Take 1 tablet by mouth 2 (two) times daily., Disp: , Rfl:    terazosin (HYTRIN) 5 MG capsule, Take 1 capsule by mouth every other day., Disp: , Rfl:    umeclidinium bromide (INCRUSE ELLIPTA) 62.5 MCG/INH AEPB, Inhale 1 puff into the lungs  daily., Disp: , Rfl:   Past Medical History: Past Medical History:  Diagnosis Date   COPD (chronic obstructive pulmonary disease) ([place])    Diabetes mellitus without complication ([place])    Diabetic neuropathy ([place])    Hypertension     Tobacco Use: History  Smoking Status   Former Smoker  Smokeless Tobacco   Never Used    Labs: 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 Cardiac and Pulmonary Rehab Latest Ref Rng & Units 02/15/2016   Hemoglobin A1c 4.8 - 5.6 % 6.8(H)       ADL UCSD:     Pulmonary Assessment Scores    Row Name 04/04/16 1253         ADL UCSD   ADL Phase Entry     SOB Score total 40     Rest 0     Walk 3     Stairs 4     Bath 1     Dress 0     Shop 2        Pulmonary Function Assessment:     Pulmonary Function Assessment - 04/04/16 1252      Pulmonary Function Tests   FVC% 34 %   FEV1% 35 %   FEV1/FVC Ratio 79.3     Initial Spirometry Results   Comments Test date 04/04/16     Breath  Bilateral Breath Sounds Decreased;Clear   Shortness of Breath Yes;Limiting activity;Fear of Shortness of Breath      Exercise Target Goals:    Exercise Program Goal: Individual exercise prescription set with THRR, safety & activity barriers. Participant demonstrates ability to understand and report RPE using BORG scale, to self-measure pulse accurately, and to acknowledge the importance of the exercise prescription.  Exercise Prescription Goal: Starting with aerobic activity 30 plus minutes a day, 3 days per week for initial exercise prescription. Provide home exercise prescription and guidelines that participant acknowledges understanding prior to discharge.  Activity Barriers & Risk Stratification:     Activity Barriers & Cardiac Risk Stratification - 04/04/16 1251      Activity Barriers & Cardiac Risk Stratification   Activity Barriers Arthritis;Shortness of Breath;Deconditioning;Joint Problems   Cardiac Risk Stratification Moderate       6 Minute Walk:     6 Minute Walk    Row Name 04/04/16 1241         6 Minute Walk   Distance 1035 feet     Walk Time 6 minutes     # of Rest Breaks 0     MPH 1.96     METS 2.53     RPE 11     Perceived Dyspnea  2     VO2 Peak 8.65     Symptoms No     Resting HR 66 bpm     Resting BP 130/82     Max Ex. HR 122 bpm     Max Ex. BP 158/84       Interval HR   Baseline HR 66     1 Minute HR 94     2 Minute HR 96     3 Minute HR 110     4 Minute HR 100     5 Minute HR 122     6 Minute HR 84     2 Minute Post HR 69     Interval Heart Rate? Yes       Interval Oxygen   Interval Oxygen? Yes     Baseline Oxygen Saturation % 94 %     Baseline Liters of Oxygen 2 L     1 Minute Oxygen Saturation % 89 %     1 Minute Liters of Oxygen 2 L     2 Minute Oxygen Saturation % 96 %     2 Minute Liters of Oxygen 2 L     3 Minute Oxygen Saturation % 86 %     3 Minute Liters of Oxygen 2 L     4 Minute Oxygen Saturation % 86 %     4 Minute Liters of Oxygen 2 L     5 Minute Oxygen Saturation % 85 %     5 Minute Liters of Oxygen 2 L     6 Minute Oxygen Saturation % 83 %     6 Minute Liters of Oxygen 2 L     2 Minute Post Oxygen Saturation % 95 %     2 Minute Post Liters of Oxygen 2 L       Oxygen Initial Assessment:     Oxygen Initial Assessment - 07/31/16 1616      Home Oxygen   Home Oxygen Device Portable Concentrator;E-Tanks   Sleep Oxygen Prescription Continuous   Liters per minute 4   Home Exercise Oxygen Prescription Continuous   Liters per minute 4   Home at Rest Exercise Oxygen  Prescription Continuous   Liters per minute 4  as needed   Compliance with Home Oxygen Use Yes     Initial 6 min Walk   Oxygen Used Continuous;E-Tanks   Liters per minute 2   Resting Oxygen Saturation  during 6 min walk 94 %   Exercise Oxygen Saturation  during 6 min walk 83 %     Program Oxygen Prescription   Program Oxygen Prescription Continuous;E-Tanks   Liters per minute 4      Intervention   Short Term Goals To learn and exhibit compliance with exercise, home and travel O2 prescription;To learn and understand importance of monitoring SPO2 with pulse oximeter and demonstrate accurate use of the pulse oximeter.;To Learn and understand importance of maintaining oxygen saturations>88%;To learn and demonstrate proper purse lipped breathing techniques or other breathing techniques.;To learn and demonstrate proper use of respiratory medications   Long  Term Goals Exhibits compliance with exercise, home and travel O2 prescription;Verbalizes importance of monitoring SPO2 with pulse oximeter and return demonstration;Maintenance of O2 saturations>88%;Exhibits proper breathing techniques, such as purse lipped breathing or other method taught during program session;Compliance with respiratory medication;Demonstrates proper use of MDIs      Oxygen Re-Evaluation:   Oxygen Discharge (Final Oxygen Re-Evaluation):   Initial Exercise Prescription:     Initial Exercise Prescription - 04/04/16 1200      Date of Initial Exercise RX and Referring Provider   Date 04/04/16   Referring Provider Thies     Oxygen   Oxygen Continuous   Liters 2     Treadmill   MPH 1.8   Grade 0   Minutes 15   METs 2.38     NuStep   Level 3   Minutes 15   METs 2.3     Recumbant Elliptical   Level 1   RPM 50   Minutes 15   METs 2.3     T5 Nustep   Level 2   Minutes 15   METs 2.3     Biostep-RELP   Level 3   Minutes 15   METs 2.3     Prescription Details   Frequency (times per week) 3   Duration Progress to 45 minutes of aerobic exercise without signs/symptoms of physical distress     Intensity   THRR 40-80% of Max Heartrate 100-134   Ratings of Perceived Exertion 11-13   Perceived Dyspnea 0-4     Progression   Progression Continue to progress workloads to maintain intensity without signs/symptoms of physical distress.     Resistance Training   Training Prescription  Yes   Weight 3   Reps 10-15      Perform Capillary Blood Glucose checks as needed.  Exercise Prescription Changes:     Exercise Prescription Changes    Row Name 04/17/16 1200 04/20/16 1400 05/04/16 1200 07/26/16 1100 08/10/16 1000     Response to Exercise   Blood Pressure (Admit) 150/80 124/78 126/60 136/64 128/74   Blood Pressure (Exercise) 152/74 142/82 146/74 146/84 128/78   Blood Pressure (Exit) 116/58 114/70 124/66 116/70 108/64   Heart Rate (Admit) 59 bpm 60 bpm 70 bpm 88 bpm 71 bpm   Heart Rate (Exercise) 133 bpm 89 bpm 83 bpm 112 bpm 89 bpm   Heart Rate (Exit) 54 bpm 64 bpm 66 bpm 87 bpm 68 bpm   Oxygen Saturation (Admit) 94 % 90 % 93 % 95 % 92 %   Oxygen Saturation (Exercise) 88 % 87 % 91 % 87 %  94 %   Oxygen Saturation (Exit) 97 % 95 % 93 % 97 % 99 %   Rating of Perceived Exertion (Exercise) _0 Perceived Dyspnea (Exercise) _1 Duration Progress to 45 minutes of aerobic exercise without signs/symptoms of physical distress Progress to 45 minutes of aerobic exercise without signs/symptoms of physical distress Progress to 45 minutes of aerobic exercise without signs/symptoms of physical distress Progress to 45 minutes of aerobic exercise without signs/symptoms of physical distress Progress to 45 minutes of aerobic exercise without signs/symptoms of physical distress   Intensity _2      Progression   Progression Continue to progress workloads to maintain intensity without signs/symptoms of physical distress. Continue to progress workloads to maintain intensity without signs/symptoms of physical distress. Continue to progress workloads to maintain intensity without signs/symptoms of physical distress. Continue to progress workloads to maintain intensity without signs/symptoms of physical distress. Continue to progress workloads to maintain intensity without signs/symptoms of physical  distress.     Resistance Training   Training Prescription _3    Weight 3  moving to 5 lb - 3 "too easy" _4 Reps 10-15 10-15 10-12 10-15 10-15     Interval Training   Interval Training  --  --  --  -- No     Oxygen   Oxygen _5    Liters _6 Treadmill   MPH 1.8 1.8  -- 1.8 1.8   Grade 0 0  -- 0 0   Minutes 15 15  -- 15 15   METs 2.38 2.38  -- 2.38 2.38     Recumbant Elliptical   Level _7 RPM 50 50 49 35  --   Minutes _8 METs  -- 1.9  -- 1.4 2.1     T5 Nustep   Level _9 SPM  --  --  --  -- 91   Minutes _10 METs  -- 2 2.2 1.9 2.4      Exercise Comments:     Exercise Comments    Row Name 04/17/16 1100 04/17/16 1236 04/20/16 1435 05/04/16 1214 05/19/16 0929   Exercise Comments First full day of exercise!  Patient was oriented to gym and equipment including functions, settings, policies, and procedures.  Patient's individual exercise prescription and treatment plan were reviewed.  All starting workloads were established based on the results of the 6 minute walk test done at initial orientation visit.  The plan for exercise progression was also introduced and progression will be customized based on patient's performance and goals. First full day of exercise!  Patient was oriented to gym and equipment including functions, settings, policies, and procedures.  Patient's individual exercise prescription and treatment plan were reviewed.  All starting workloads were established based on the results of the 6 minute walk test done at initial orientation visit.  The plan for exercise progression was also introduced and progression will be customized based on patient's performance and goals. Mr Tozzi has done well in his first week of exercise. [name] continues to do well with exercise.  BG checks all within normal range. Mr Croft has not attended class since 05/03/16.    Lynn Name 07/26/16  1156 08/10/16 1025         Exercise Comments [name] has just returned after an extended absence.  He has tolerated exercise well. [name] has tolerated exercise well since returning.         Exercise Goals and Review:   Exercise Goals Re-Evaluation :   Discharge Exercise Prescription (Final Exercise Prescription Changes):     Exercise Prescription Changes - 08/10/16 1000      Response to Exercise   Blood Pressure (Admit) 128/74   Blood Pressure (Exercise) 128/78   Blood Pressure (Exit) 108/64   Heart Rate (Admit) 71 bpm   Heart Rate (Exercise) 89 bpm   Heart Rate (Exit) 68 bpm   Oxygen Saturation (Admit) 92 %   Oxygen Saturation (Exercise) 94 %   Oxygen Saturation (Exit) 99 %   Rating of Perceived Exertion (Exercise) 11   Perceived Dyspnea (Exercise) 2   Duration Progress to 45 minutes of aerobic exercise without signs/symptoms of physical distress   Intensity THRR unchanged     Progression   Progression Continue to progress workloads to maintain intensity without signs/symptoms of physical distress.     Resistance Training   Training Prescription Yes   Weight 3   Reps 10-15     Interval Training   Interval Training No     Oxygen   Oxygen Continuous   Liters 4     Treadmill   MPH 1.8   Grade 0   Minutes 15   METs 2.38     Recumbant Elliptical   Level 2   Minutes 15   METs 2.1     T5 Nustep   Level 2   SPM 91   Minutes 15   METs 2.4      Nutrition:  Target Goals: Understanding of nutrition guidelines, daily intake of sodium <1570m, cholesterol <2068m calories 30% from fat and 7% or less from saturated fats, daily to have 5 or more servings of fruits and vegetables.  Biometrics:     Pre Biometrics - 04/04/16 1241      Pre Biometrics   Height 5' 10" (1.778 m)   Weight 249 lb 9.6 oz (113.2 kg)   Waist Circumference 51.75 inches   Hip Circumference 43 inches   Waist to Hip Ratio 1.2 %   BMI (Calculated) 35.9        Nutrition Therapy Plan and Nutrition Goals:   Nutrition Discharge: Rate Your Plate Scores:   Nutrition Goals Re-Evaluation:   Nutrition Goals Discharge (Final Nutrition Goals Re-Evaluation):   Psychosocial: Target Goals: Acknowledge presence or absence of significant depression and/or stress, maximize coping skills, provide positive support system. Participant is able to verbalize types and ability to use techniques and skills needed for reducing stress and depression.   Initial Review & Psychosocial Screening:     Initial Psych Review & Screening - 04/04/16 [place]   Comments Mr BuCanipeas good support from his wife. He hopes to be more active. He was very athletic in the past and misses being fit. [place], but Mr BuPoolers looking forward to participating in [facility]     Barriers   Psychosocial barriers to participate in program The patient should benefit from training in stress management and relaxation.     Screening Interventions   Interventions Encouraged to exercise;Program counselor consult  Quality of Life Scores:     Quality of Life - 04/04/16 1320      Quality of Life Scores   Health/Function Pre 19.63 %   Socioeconomic Pre 20.71 %   Psych/Spiritual Pre 21 %   Family Pre 21 %   GLOBAL Pre 20.31 %      PHQ-9: Recent Review Flowsheet Data    Depression screen [facility] 2/9 04/04/2016 02/15/2016   Decreased Interest 2 0   Down, Depressed, Hopeless 2 0   PHQ - 2 Score 4 0   Altered sleeping 1 -   Tired, decreased energy 3 -   Change in appetite 1 -   Feeling bad or failure about yourself  0 -   Trouble concentrating 1 -   Moving slowly or fidgety/restless 0 -   Suicidal thoughts 0 -   PHQ-9 Score 10 -   Difficult doing work/chores Somewhat difficult -     Interpretation of Total Score  Total Score Depression  Severity:  1-4 = Minimal depression, 5-9 = Mild depression, 10-14 = Moderate depression, 15-19 = Moderately severe depression, 20-27 = Severe depression   Psychosocial Evaluation and Intervention:     Psychosocial Evaluation - 04/17/16 1118      Psychosocial Evaluation & Interventions   Interventions Stress management education;Relaxation education;Encouraged to exercise with the program and follow exercise prescription   Comments Counselor met with Mr. Corday Wyka") today for initial psychosocial evaluation.  He will be 71 years old later this month and is retired.  He is in this program due to COPD diagnosis approx. 4 years ago.   Nydia Bouton has a strong support system being the oldest of 7 children; living with his girlfriend; and has lots of relatives who live closeby.  In addition to COPD, he has knee problems; diabetes; and chronic sleep problems.  He states his sleep is intermittent, but he is likely getting at least 6 or more hours per night.  He uses Oxygen at bedtime only.  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  In discussing this with [name], he states he's not depressed only feels a sense of "hopelessness" at times with his current health condition.  His mood currently is positive and states that is typical.  He has minimal stress in his life, although he just moved here from [place] recently.  He has goals to breathe better and increase his stamina so he will be less tired while in this program.  Staff will be following with him throughout the course of this program.        Psychosocial Re-Evaluation:     Psychosocial Re-Evaluation    Row Name 05/03/16 1044 05/08/16 1017 05/10/16 1557 07/24/16 1126       Psychosocial Re-Evaluation   Comments Counselor follow up with Mr. Lofton "Nydia Bouton" today reporting he is seeing progress since coming into this program with going up the steps without having to stop.  He continues to have knee  pain and has seen a Dr. about this telling him it is "Arthritis", but he plans to get a second opinion to see if that might help.  His mood is more positive as a result of this program and his support systems continue to encourage him to attend class regularly.  [name] continues to have sleep disruptions due to the Rx causing him to have to go to the bathroom multiple times nightly.  He is hopeful that his Dr. will  be able to reduce that amount of Rx so his sleep can improve with time.  Counselor commended [name] for his hard work and progress towards his goals.   Bria Portales called to say he was sorry that he could not attend Pulm Rehab today because he is sick. There is also a chance of an ice storm today too.  Tran psychosocial assessment reveals no barriers at this time to participation in Pulmonary Rehab.  He  has good family and friend support that encourages Cosimo to participate in [place] and progress with His goals.  Braylynn concerns are monitored, but he  has acknowledge that attending the program has helped to maintain quality life with improved mobility, self-care, and emotional and financial stability.  Devesh is commended for regular attendance and self-motivation to improve His pulmonary disease management. Counselor follow up with [name] today reporting he has been out for awhile due to having the flu and the "terrible weather" we've been having.  He reports having gained some weight and not feeling "up to par" yet; but knows this program can help him get back his quality of life he was experiencing previously when he was here.  Counselor encouraged him to come consistently as well as meet with the dietician to help with his weight loss goals.      Expected Outcomes  --  --  -- Nydia Bouton will attend Pulmonary Rehab more consistently and continue to work on his exercise goals for this program.  He will schedule an appointment to meet with the dietician to address his weight loss goals.       Interventions  --  --  -- Encouraged to attend Pulmonary Rehabilitation for the exercise    Continue Psychosocial Services   --  --  -- Follow up required by staff       Psychosocial Discharge (Final Psychosocial Re-Evaluation):     Psychosocial Re-Evaluation - 07/24/16 1126      Psychosocial Re-Evaluation   Comments Counselor follow up with [name] today reporting he has been out for awhile due to having the flu and the "terrible weather" we've been having.  He reports having gained some weight and not feeling "up to par" yet; but knows this program can help him get back his quality of life he was experiencing previously when he was here.  Counselor encouraged him to come consistently as well as meet with the dietician to help with his weight loss goals.     Expected Outcomes [name] will attend Pulmonary Rehab more consistently and continue to work on his exercise goals for this program.  He will schedule an appointment to meet with the dietician to address his weight loss goals.     Interventions Encouraged to attend Pulmonary Rehabilitation for the exercise   Continue Psychosocial Services  Follow up required by staff      Education: Education Goals: Education classes will be provided on a weekly basis, covering required topics. Participant will state understanding/return demonstration of topics presented.  Learning Barriers/Preferences:     Learning Barriers/Preferences - 04/04/16 1251      Learning Barriers/Preferences   Learning Barriers None   Learning Preferences None      Education Topics: Initial Evaluation Education: - Verbal, written and demonstration of respiratory meds, RPE/PD scales, oximetry and breathing techniques. Instruction on use of nebulizers and MDIs: cleaning and proper use, rinsing mouth with steroid doses and importance of monitoring MDI activations.   Pulmonary Rehab from 05/03/2016 in [facility] Cardiac and Pulmonary Rehab  Date  04/04/16  Educator  LB   Instruction Review Code  2- meets goals/outcomes      General Nutrition Guidelines/Fats and Fiber: -Group instruction provided by verbal, written material, models and posters to present the general guidelines for heart healthy nutrition. Gives an explanation and review of dietary fats and fiber.   Controlling Sodium/Reading Food Labels: -Group verbal and written material supporting the discussion of sodium use in heart healthy nutrition. Review and explanation with models, verbal and written materials for utilization of the food label.   Exercise Physiology & Risk Factors: - Group verbal and written instruction with models to review the exercise physiology of the cardiovascular system and associated critical values. Details cardiovascular disease risk factors and the goals associated with each risk factor.   Pulmonary Rehab from 05/03/2016 in [facility] Cardiac and Pulmonary Rehab  Date  05/03/16  Educator  [facility]  Instruction Review Code  2- meets goals/outcomes      Aerobic Exercise & Resistance Training: - Gives group verbal and written discussion on the health impact of inactivity. On the components of aerobic and resistive training programs and the benefits of this training and how to safely progress through these programs.   Flexibility, Balance, General Exercise Guidelines: - Provides group verbal and written instruction on the benefits of flexibility and balance training programs. Provides general exercise guidelines with specific guidelines to those with heart or lung disease. Demonstration and skill practice provided.   Pulmonary Rehab from 05/03/2016 in [facility] Cardiac and Pulmonary Rehab  Date  04/19/16  Educator  [facility]  Instruction Review Code  2- meets goals/outcomes      Stress Management: - Provides group verbal and written instruction about the health risks of elevated stress, cause of high stress, and healthy ways to reduce stress.   Depression: - Provides group verbal and  written instruction on the correlation between heart/lung disease and depressed mood, treatment options, and the stigmas associated with seeking treatment.   Exercise & Equipment Safety: - Individual verbal instruction and demonstration of equipment use and safety with use of the equipment.   Pulmonary Rehab from 05/03/2016 in [facility] Cardiac and Pulmonary Rehab  Date  04/17/16  Educator  AS  Instruction Review Code  2- meets goals/outcomes      Infection Prevention: - Provides verbal and written material to individual with discussion of infection control including proper hand washing and proper equipment cleaning during exercise session.   Pulmonary Rehab from 05/03/2016 in [facility] Cardiac and Pulmonary Rehab  Date  04/17/16  Educator  AS  Instruction Review Code  2- meets goals/outcomes      Falls Prevention: - Provides verbal and written material to individual with discussion of falls prevention and safety.   Pulmonary Rehab from 05/03/2016 in [facility] Cardiac and Pulmonary Rehab  Date  04/04/16  Educator  LB  Instruction Review Code  2- meets goals/outcomes      Diabetes: - Individual verbal and written instruction to review signs/symptoms of diabetes, desired ranges of glucose level fasting, after meals and with exercise. Advice that pre and post exercise glucose checks will be done for 3 sessions at entry of program.   Pulmonary Rehab from 05/03/2016 in [facility] Cardiac and Pulmonary Rehab  Date  04/04/16  Educator  LB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

## 2016-09-19 ENCOUNTER — Encounter: Payer: Self-pay | Admitting: Respiratory Therapy

## 2016-09-19 ENCOUNTER — Telehealth: Payer: Self-pay | Admitting: Respiratory Therapy

## 2016-09-19 DIAGNOSIS — J449 Chronic obstructive pulmonary disease, unspecified: Secondary | ICD-10-CM

## 2016-09-19 NOTE — Telephone Encounter (Signed)
Called Mr Mario [name] - last attended 07/31/16. He has been sick, has lost 18 pounds, and plans to have a knee injection on 10/07/16. Mr Mario [name] states he will be back to [name] to finish the program.

## 2016-09-29 ENCOUNTER — Emergency Department
Admission: EM | Admit: 2016-09-29 | Discharge: 2016-09-29 | Disposition: A | Discharge status: Home / Self Care | Payer: Medicare Other | Attending: Emergency Medicine | Admitting: Emergency Medicine

## 2016-09-29 ENCOUNTER — Emergency Department: Payer: Medicare Other

## 2016-09-29 ENCOUNTER — Encounter: Payer: Self-pay | Admitting: Emergency Medicine

## 2016-09-29 DIAGNOSIS — Z7984 Long term (current) use of oral hypoglycemic drugs: Secondary | ICD-10-CM | POA: Diagnosis not present

## 2016-09-29 DIAGNOSIS — I5022 Chronic systolic (congestive) heart failure: Secondary | ICD-10-CM | POA: Diagnosis not present

## 2016-09-29 DIAGNOSIS — J449 Chronic obstructive pulmonary disease, unspecified: Secondary | ICD-10-CM | POA: Insufficient documentation

## 2016-09-29 DIAGNOSIS — M25572 Pain in left ankle and joints of left foot: Secondary | ICD-10-CM | POA: Diagnosis not present

## 2016-09-29 DIAGNOSIS — M79672 Pain in left foot: Secondary | ICD-10-CM

## 2016-09-29 DIAGNOSIS — Z87891 Personal history of nicotine dependence: Secondary | ICD-10-CM | POA: Diagnosis not present

## 2016-09-29 DIAGNOSIS — Z79899 Other long term (current) drug therapy: Secondary | ICD-10-CM | POA: Diagnosis not present

## 2016-09-29 DIAGNOSIS — E119 Type 2 diabetes mellitus without complications: Secondary | ICD-10-CM | POA: Insufficient documentation

## 2016-09-29 DIAGNOSIS — I110 Hypertensive heart disease with heart failure: Secondary | ICD-10-CM | POA: Insufficient documentation

## 2016-09-29 MED ORDER — HYDROCODONE-ACETAMINOPHEN 5-325 MG PO TABS: 1.0000 | ORAL_TABLET | ORAL | 0 refills | Status: DC | PRN

## 2016-09-29 MED ORDER — HYDROCODONE-ACETAMINOPHEN 5-325 MG PO TABS
1.0000 | ORAL_TABLET | Freq: Once | ORAL | Status: AC
  Administered 2016-09-29: 1 via ORAL
  Filled 2016-09-29: qty 1

## 2016-09-29 NOTE — ED Triage Notes (Signed)
C/O left ankle pain x 4 days.

## 2016-09-29 NOTE — ED Provider Notes (Signed)
[facility] Emergency Department Provider Note   ____________________________________________   First MD Initiated Contact with Patient 09/29/16 1046     (approximate)  I have reviewed the triage vital signs and the nursing notes.   HISTORY  Chief Complaint Ankle Pain    HPI Mario [name] is a 71 y.o. male is here complaining of left ankle pain for 4 days. Patient states that there is been no history of injury and that today his ankle is more painful. He denies any past injury to his ankle. He has not taken any over-the-counter medication for his pain. In talking with him after his x-rays patient states that he has had similar ankle pain and that this makes fourth episode in a number of years. In the past he has not taken any medication and is well away. Patient denies any history of gout. Patient moved to this area and does not have a podiatrist. Currently he rates his pain as 10 over 10.   Past Medical History:  Diagnosis Date  . COPD (chronic obstructive pulmonary disease) (HCC)   . Diabetes mellitus without complication (HCC)   . Diabetic neuropathy (HCC)   . Hypertension     Patient Active Problem List   Diagnosis Date Noted  . Lung nodule 02/09/2016  . Exercise hypoxemia 12/28/2015  . Chronic obstructive pulmonary disease (HCC) 03/16/2015  . Nuclear sclerosis 03/16/2015  . OSA (obstructive sleep apnea) 03/16/2015  . Primary open angle glaucoma of both eyes, severe stage 03/16/2015  . Type 2 diabetes mellitus without complication, without long-term current use of insulin (HCC) 03/16/2015  . Arthralgia of left lower leg 10/29/2014  . Osteoarthritis of left knee 09/10/2014  . Type 2 diabetes mellitus with diabetic neuropathy (HCC) 08/04/2014  . Cataract, nuclear sclerotic senile 04/01/2014  . 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 Primary open angle glaucoma 02/27/2014  . Flat feet  01/29/2014  . Foot pain, bilateral 01/29/2014  . Tinea pedis of both feet 01/29/2014  . Ptosis of both eyelids 11/25/2013  . Type 2 diabetes mellitus without retinopathy (HCC) 11/25/2013  . Onychomycosis 11/19/2013  . Chronic systolic heart failure (HCC) 11/11/2013  . Emphysema of lung (HCC) 11/11/2013  . Essential hypertension 11/11/2013  . Screen for colon cancer 11/05/2013  . Cardiomyopathy (HCC) 10/08/2013  . Systolic HF (heart failure) (HCC) 10/08/2013  . Acute systolic CHF (congestive heart failure) (HCC) 09/16/2013  . COPD (chronic obstructive pulmonary disease) (HCC) 09/16/2013  . HTN (hypertension) 09/16/2013  . Obesity 09/16/2013  . PAF (paroxysmal atrial fibrillation) (HCC) 09/16/2013  . Senile nuclear sclerosis 05/22/2013  . Muscle weakness (generalized) 11/11/2009  . Long term current use of anticoagulant therapy 06/17/2009  . Pulmonary embolism (HCC) 06/17/2009  . ED (erectile dysfunction) of organic origin 05/13/2003    Past Surgical History:  Procedure Laterality Date  . COLONOSCOPY  08/30/2015  . KNEE SURGERY Right    screw in knee  . WRIST SURGERY Left    metal removed    Prior to Admission medications   Medication Sig Start Date End Date Taking? Authorizing Provider  albuterol (PROVENTIL HFA;VENTOLIN HFA) 108 (90 Base) MCG/ACT inhaler Inhale 2 puffs into the lungs as needed. 11/23/15   [provider]  amLODipine (NORVASC) 5 MG tablet Take 1 tablet by mouth daily. 07/02/15   [provider]  diclofenac sodium (VOLTAREN) 1 % GEL Apply 1 application topically 4 (four) times daily as needed. 07/08/14   [provider]  edoxaban (SAVAYSA) 60 MG TABS tablet Take 60 mg by mouth daily. cardiology 07/02/15   [provider]  fluticasone-salmeterol (ADVAIR HFA) 230-21 MCG/ACT inhaler Inhale 2 puffs into the lungs 2 (two) times daily. 11/15/15   [provider]  furosemide (LASIX) 80 MG tablet Take 1 tablet by mouth daily. 08/23/15    [provider]  gabapentin (NEURONTIN) 300 MG capsule Take 1 capsule by mouth 3 (three) times daily. 07/17/15   [provider]  glucose blood (ONE TOUCH ULTRA TEST) test strip Use as instructed FOR TESTING three times daily.  E11.9 06/01/15   [provider]  HYDROcodone-acetaminophen (NORCO/VICODIN) 5-325 MG tablet Take 1 tablet by mouth every 4 (four) hours as needed for moderate pain. 09/29/16   Tommi Rumps, PA-C  ipratropium-albuterol (DUONEB) 0.5-2.5 (3) MG/3ML SOLN Inhale into the lungs. 12/28/15   [provider]  losartan (COZAAR) 50 MG tablet Take 1 tablet by mouth daily. 07/05/15   [provider]  metFORMIN (GLUCOPHAGE) 1000 MG tablet Take 1 tablet by mouth 2 (two) times daily. 08/12/15   [provider]  sotalol (BETAPACE) 80 MG tablet Take 1 tablet by mouth 2 (two) times daily. 08/25/14   [provider]  terazosin (HYTRIN) 5 MG capsule Take 1 capsule by mouth every other day. 06/08/14   [provider]  umeclidinium bromide (INCRUSE ELLIPTA) 62.5 MCG/INH AEPB Inhale 1 puff into the lungs daily. 02/09/16   [provider]    Allergies Ace inhibitors  No family history on file.  Social History Social History  Substance Use Topics  . Smoking status: Former Games developer  . Smokeless tobacco: Never Used  . Alcohol use No    Review of Systems Constitutional: No fever/chills Cardiovascular: Denies chest pain. Respiratory: Denies shortness of breath. Gastrointestinal:   No nausea, no vomiting.   Musculoskeletal: Positive for left ankle pain. Skin: Negative for rash. Neurological: Negative for focal weakness or numbness.   ____________________________________________   PHYSICAL EXAM:  VITAL SIGNS: ED Triage Vitals  Enc Vitals Group     BP 09/29/16 1019 126/85     Pulse Rate 09/29/16 1019 76     Resp 09/29/16 1019 18     Temp 09/29/16 1019 98.2 F (36.8 C)     Temp Source 09/29/16 1019 Oral      SpO2 09/29/16 1019 95 %     Weight 09/29/16 1013 248 lb (112.5 kg)     Height 09/29/16 1013 5\' 10"  (1.778 m)     Head Circumference --      Peak Flow --      Pain Score 09/29/16 1013 10     Pain Loc --      Pain Edu? --      Excl. in GC? --     Constitutional: Alert and oriented. Well appearing and in no acute distress. Eyes: Conjunctivae are normal.  Head: Atraumatic. Neck: No stridor.   Cardiovascular: Normal rate, regular rhythm. Grossly normal heart sounds.  Good peripheral circulation. Respiratory: Normal respiratory effort.  No retractions. Lungs CTAB. Musculoskeletal:On examination of the left ankle and foot there is no gross deformity noted. There is some diffuse soft tissue swelling present and patient is moderately tender on palpation of the dorsal aspect of the left foot. No warmth or erythema was noted. Pulse is positive. No discoloration of the skin is noted. Neurologic:  Normal speech and language. No gross focal neurologic deficits are appreciated. No gait instability. Skin:  Skin is warm, dry and intact. No rash noted. Psychiatric: Mood and affect are normal. Speech and behavior are normal.  ____________________________________________   LABS (all labs ordered are listed, but only abnormal results are displayed)  Labs Reviewed - No data to display  RADIOLOGY  Left ankle x-ray per radiologist: IMPRESSION:  1. Mild soft swelling over the medial and lateral malleoli without  acute osseous abnormality.  2. Osteochondral defect in the medial talar dome without collapse.  3. Diabetic vascular calcifications.  4. Plantar calcaneal spur.   I, Tommi Rumpshonda L Nasario Czerniak, personally viewed and evaluated these images (plain radiographs) as part of my medical decision making, as well as reviewing the written report by the radiologist. ____________________________________________   PROCEDURES  Procedure(s) performed: None  Procedures  Critical Care performed:  No  ____________________________________________   INITIAL IMPRESSION / ASSESSMENT AND PLAN / ED COURSE  Pertinent labs & imaging results that were available during my care of the patient were reviewed by me and considered in my medical decision making (see chart for details).  Patient was given a prescription for hydrocodone to be taken one every 4 hours as needed for pain. Patient was also put in a postop shoe and given crutches. He is follow-up with Dr. Ether GriffinsFowler who is the podiatrist on call today. Patient was up walking on crutches prior to discharge.   ____________________________________________   FINAL CLINICAL IMPRESSION(S) / ED DIAGNOSES  Final diagnoses:  Left foot pain      NEW MEDICATIONS STARTED DURING THIS VISIT:  Discharge Medication List as of 09/29/2016  1:12 PM    START taking these medications   Details  HYDROcodone-acetaminophen (NORCO/VICODIN) 5-325 MG tablet Take 1 tablet by mouth every 4 (four) hours as needed for moderate pain., Starting Fri 09/29/2016, Print         Note:  This document was prepared using Dragon voice recognition software and may include unintentional dictation errors.    Tommi RumpsSummers, Avaiyah Strubel L, PA-C 09/29/16 1657    Jeanmarie PlantMcShane, James A, MD 09/30/16 2232

## 2016-09-29 NOTE — Discharge Instructions (Signed)
Call and make an appointment with Dr. Ether GriffinsFowler who is the podiatrist on call today. [facility]. Use crutches for support. Elevate foot as needed to decrease swelling. You may use ice or heat packs to your foot as needed for comfort. Take Norco as needed for pain. Do not drive while taking this medication.

## 2016-10-02 ENCOUNTER — Encounter: Payer: Self-pay | Admitting: Respiratory Therapy

## 2016-10-02 DIAGNOSIS — J449 Chronic obstructive pulmonary disease, unspecified: Secondary | ICD-10-CM
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
## 2016-10-24 ENCOUNTER — Encounter: Payer: Self-pay | Admitting: Respiratory Therapy

## 2016-10-24 ENCOUNTER — Telehealth: Payer: Self-pay | Admitting: Respiratory Therapy

## 2016-10-24 DIAGNOSIS — J449 Chronic obstructive pulmonary disease, unspecified: Secondary | ICD-10-CM

## 2016-10-24 NOTE — Telephone Encounter (Signed)
Called Mr Mario [name] who last attended [name] 07/31/16.  I left him a message to check on his knee injection on 10/07/16 and to check [name].

## 2016-10-30 DIAGNOSIS — J449 Chronic obstructive pulmonary disease, unspecified: Secondary | ICD-10-CM
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
## 2016-10-31 ENCOUNTER — Telehealth: Payer: Self-pay

## 2016-10-31 DIAGNOSIS — J449 Chronic obstructive pulmonary disease, unspecified: Secondary | ICD-10-CM

## 2016-10-31 NOTE — Telephone Encounter (Signed)
Mr. Pamala HurryOrlando called from [place], had two knee injections last week, recovering from heel spur and is in a boot. Patient ask to be discharged from [name] and wants to return at a later date when he can commit to three days a week.
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
## 2016-10-31 NOTE — Progress Notes (Signed)
Discharge Summary  Patient Details  Name: Mario [name] MRN: [id number] Date of Birth: 02-27-1946 Referring Provider:     Pulmonary Rehab from 04/04/2016 in [facility]  Referring Provider  Thies       Number of Visits: 8  Reason for Discharge:  Early Exit:  [name] called from [place], had two knee injections last week, recovering from heel spur and is in a boot. Patient ask to be discharged from [name] and wants to return at a later date when he can commit to three days a week.  Smoking History:  History  Smoking Status  . Former Smoker  Smokeless Tobacco  . Never Used    Diagnosis:  COPD, mild (HCC)  ADL UCSD:   Initial Exercise Prescription:   Discharge Exercise Prescription (Final Exercise Prescription Changes):     Exercise Prescription Changes - 08/10/16 1000      Response to Exercise   Blood Pressure (Admit) 128/74   Blood Pressure (Exercise) 128/78   Blood Pressure (Exit) 108/64   Heart Rate (Admit) 71 bpm   Heart Rate (Exercise) 89 bpm   Heart Rate (Exit) 68 bpm   Oxygen Saturation (Admit) 92 %   Oxygen Saturation (Exercise) 94 %   Oxygen Saturation (Exit) 99 %   Rating of Perceived Exertion (Exercise) 11   Perceived Dyspnea (Exercise) 2   Duration Progress to 45 minutes of aerobic exercise without signs/symptoms of physical distress   Intensity THRR unchanged     Progression   Progression Continue to progress workloads to maintain intensity without signs/symptoms of physical distress.     Resistance Training   Training Prescription Yes   Weight 3   Reps 10-15     Interval Training   Interval Training No     Oxygen   Oxygen Continuous   Liters 4     Treadmill   MPH 1.8   Grade 0   Minutes 15   METs 2.38     Recumbant Elliptical   Level 2   Minutes 15   METs 2.1     T5 Nustep   Level 2   SPM 91   Minutes 15   METs 2.4      Functional Capacity:   Psychological, QOL, Others -  Outcomes: PHQ 2/9: Depression screen [facility] 2/9 04/04/2016 02/15/2016  Decreased Interest 2 0  Down, Depressed, Hopeless 2 0  PHQ - 2 Score 4 0  Altered sleeping 1 -  Tired, decreased energy 3 -  Change in appetite 1 -  Feeling bad or failure about yourself  0 -  Trouble concentrating 1 -  Moving slowly or fidgety/restless 0 -  Suicidal thoughts 0 -  PHQ-9 Score 10 -  Difficult doing work/chores Somewhat difficult -    Quality of Life:   Personal Goals: Goals established at orientation with interventions provided to work toward goal.    Personal Goals Discharge:     Goals and Risk Factor Review    Row Name 05/10/16 1547 07/31/16 1607           Core Components/Risk Factors/Patient Goals Review   Personal Goals Review Sedentary;Increase Strength and Stamina;Improve shortness of breath with ADL's;Increase knowledge of respiratory medications and ability to use respiratory devices properly.;Develop more efficient breathing techniques such as purse lipped breathing and diaphragmatic breathing and practicing self-pacing with activity.;Diabetes;Hypertension;Heart Failure Weight Management/Obesity;Improve shortness of breath with ADL's;Increase knowledge of respiratory medications and ability to use respiratory devices properly.;Develop more efficient  breathing techniques such as purse lipped breathing and diaphragmatic breathing and practicing self-pacing with activity.;Diabetes;Hypertension;Heart Failure      Review Mario [name] has made progress with his exercise after 4 sessions in [name]. He is maintaining his weight at 251-254 and is followed by the [facility]. He plans to meet with the dietitian, but has not been able to complete the forms. His glucose has been acceptable with his pre and post exercise readings. Blood pressures are acceptable, and he is compliant with his medication. Mario [name] is self-adjusting his oxygen from 2 to 3l/m on the treadmill and is using PLB with exercise and at  home to improve his shortness of breath. His respiratory medicaation was reviewed during his medical evaluation and will be reviewed as a follow-up. Mario [name] has returned from a 2 month absence and is ready to be committed to [name]. He has progressed with his exercise goals and is attending regularly 3 days/ week.  His increased weight is concerning and does have an appointment with the dietitian on 08/09/16. He is managing his diabetes with a new injection medication. Blood pressures have been acceptable, and he is maintaining his fluid restriction of 64oz. We reviewed his MDI's  and he has a good understanding of his respiratory medication. His wife and Mario [name] plan to join the [facility] and continue their exercise after [name].       Expected Outcomes 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, [place], and CHF Continue exercising to improve endurance and work on weight loss.         Nutrition & Weight - Outcomes:    Nutrition:   Nutrition Discharge:   Education Questionnaire Score:   Goals reviewed with patient; copy given to patient.

## 2017-05-14 ENCOUNTER — Other Ambulatory Visit: Payer: Self-pay

## 2017-05-14 ENCOUNTER — Encounter: Payer: Medicare Other | Attending: Internal Medicine

## 2017-05-14 VITALS — Ht 69.3 in | Wt 258.9 lb

## 2017-05-14 DIAGNOSIS — R0902 Hypoxemia: Secondary | ICD-10-CM | POA: Insufficient documentation

## 2017-05-14 DIAGNOSIS — J449 Chronic obstructive pulmonary disease, unspecified: Secondary | ICD-10-CM

## 2017-05-14 DIAGNOSIS — I509 Heart failure, unspecified: Secondary | ICD-10-CM | POA: Diagnosis not present

## 2017-05-14 NOTE — Patient Instructions (Signed)
Patient Instructions  Patient Details  Name: Mario LeepOrlando [name] MRN: [id number] Date of Birth: 12/23/1945 Referring Provider:  Mickey Farberhies, David, MD  Below are the personal goals you chose as well as exercise and nutrition goals. Our goal is to help you keep on track towards obtaining and maintaining your goals. We will be discussing your progress on these goals with you throughout the program.  Initial Exercise Prescription: Initial Exercise Prescription - 05/14/17 1500      Date of Initial Exercise RX and Referring Provider   Date  05/14/17    Referring Provider  Mickey Farberhies, David MD      Oxygen   Oxygen  Continuous    Liters  2      Treadmill   MPH  1.8    Grade  0    Minutes  15    METs  2.38      Recumbant Elliptical   Level  1    RPM  50    Minutes  15    METs  2      T5 Nustep   Level  1    SPM  80    Minutes  15    METs  2      Prescription Details   Frequency (times per week)  3    Duration  Progress to 45 minutes of aerobic exercise without signs/symptoms of physical distress      Intensity   THRR 40-80% of Max Heartrate  106-135    Ratings of Perceived Exertion  11-13    Perceived Dyspnea  0-4      Progression   Progression  Continue to progress workloads to maintain intensity without signs/symptoms of physical distress.      Resistance Training   Training Prescription  Yes    Weight  3 lbs    Reps  10-15       Exercise Goals: Frequency: Be able to perform aerobic exercise three times per week working toward 3-5 days per week.  Intensity: Work with a perceived exertion of 11 (fairly light) - 15 (hard) as tolerated. Follow your new exercise prescription and watch for changes in prescription as you progress with the program. Changes will be reviewed with you when they are made.  Duration: You should be able to do 30 minutes of continuous aerobic exercise in addition to a 5 minute warm-up and a 5 minute cool-down routine.  Nutrition Goals: Your personal  nutrition goals will be established when you do your nutrition analysis with the dietician.  The following are nutrition guidelines to follow: Cholesterol < 200mg /day Sodium < 1500mg /day Fiber: Men over 50 yrs - 30 grams per day  Personal Goals: Personal Goals and Risk Factors at Admission - 05/14/17 1435      Core Components/Risk Factors/Patient Goals on Admission    Weight Management  Yes;Weight Loss    Intervention  Weight Management: Develop a combined nutrition and exercise program designed to reach desired caloric intake, while maintaining appropriate intake of nutrient and fiber, sodium and fats, and appropriate energy expenditure required for the weight goal.;Weight Management: Provide education and appropriate resources to help participant work on and attain dietary goals.    Admit Weight  258 lb 14.4 oz (117.4 kg)    Goal Weight: Short Term  253 lb (114.8 kg)    Goal Weight: Long Term  248 lb (112.5 kg)    Expected Outcomes  Short Term: Continue to assess and modify interventions until short term weight is  achieved;Long Term: Adherence to nutrition and physical activity/exercise program aimed toward attainment of established weight goal;Understanding recommendations for meals to include 15-35% energy as protein, 25-35% energy from fat, 35-60% energy from carbohydrates, less than 200mg  of dietary cholesterol, 20-35 gm of total fiber daily;Understanding of distribution of calorie intake throughout the day with the consumption of 4-5 meals/snacks;Weight Loss: Understanding of general recommendations for a balanced deficit meal plan, which promotes 1-2 lb weight loss per week and includes a negative energy balance of [phone] kcal/d    Improve shortness of breath with ADL's  Yes    Intervention  Provide education, individualized exercise plan and daily activity instruction to help decrease symptoms of SOB with activities of daily living.    Expected Outcomes  Short Term: Achieves a reduction  of symptoms when performing activities of daily living.    Diabetes  Yes    Intervention  Provide education about signs/symptoms and action to take for hypo/hyperglycemia.;Provide education about proper nutrition, including hydration, and aerobic/resistive exercise prescription along with prescribed medications to achieve blood glucose in normal ranges: Fasting glucose 65-99 mg/dL    Expected Outcomes  Short Term: Participant verbalizes understanding of the signs/symptoms and immediate care of hyper/hypoglycemia, proper foot care and importance of medication, aerobic/resistive exercise and nutrition plan for blood glucose control.;Long Term: Attainment of HbA1C < 7%.    Heart Failure  Yes    Intervention  Provide a combined exercise and nutrition program that is supplemented with education, support and counseling about heart failure. Directed toward relieving symptoms such as shortness of breath, decreased exercise tolerance, and extremity edema.    Expected Outcomes  Improve functional capacity of life;Short term: Attendance in program 2-3 days a week with increased exercise capacity. Reported lower sodium intake. Reported increased fruit and vegetable intake. Reports medication compliance.;Short term: Daily weights obtained and reported for increase. Utilizing diuretic protocols set by physician.;Long term: Adoption of self-care skills and reduction of barriers for early signs and symptoms recognition and intervention leading to self-care maintenance.    Hypertension  -- states he is not on blood pressure medication       Tobacco Use Initial Evaluation: Social History   Tobacco Use  Smoking Status Former Smoker  . Packs/day: 0.50  . Years: 1.00  . Pack years: 0.50  . Types: Cigarettes  . Last attempt to quit: 05/01/1969  . Years since quitting: 48.0  Smokeless Tobacco Never Used    Exercise Goals and Review: Exercise Goals    Row Name 05/14/17 1554             Exercise Goals    Increase Physical Activity  Yes       Intervention  Provide advice, education, support and counseling about physical activity/exercise needs.;Develop an individualized exercise prescription for aerobic and resistive training based on initial evaluation findings, risk stratification, comorbidities and participant's personal goals.       Expected Outcomes  Achievement of increased cardiorespiratory fitness and enhanced flexibility, muscular endurance and strength shown through measurements of functional capacity and personal statement of participant.       Increase Strength and Stamina  Yes       Intervention  Provide advice, education, support and counseling about physical activity/exercise needs.;Develop an individualized exercise prescription for aerobic and resistive training based on initial evaluation findings, risk stratification, comorbidities and participant's personal goals.       Expected Outcomes  Achievement of increased cardiorespiratory fitness and enhanced flexibility, muscular endurance and strength shown through measurements  of functional capacity and personal statement of participant.       Able to understand and use rate of perceived exertion (RPE) scale  Yes       Intervention  Provide education and explanation on how to use RPE scale       Expected Outcomes  Short Term: Able to use RPE daily in rehab to express subjective intensity level;Long Term:  Able to use RPE to guide intensity level when exercising independently       Able to understand and use Dyspnea scale  Yes       Intervention  Provide education and explanation on how to use Dyspnea scale       Expected Outcomes  Short Term: Able to use Dyspnea scale daily in rehab to express subjective sense of shortness of breath during exertion;Long Term: Able to use Dyspnea scale to guide intensity level when exercising independently       Knowledge and understanding of Target Heart Rate Range (THRR)  Yes       Intervention  Provide  education and explanation of THRR including how the numbers were predicted and where they are located for reference       Expected Outcomes  Short Term: Able to state/look up THRR;Long Term: Able to use THRR to govern intensity when exercising independently;Short Term: Able to use daily as guideline for intensity in rehab       Able to check pulse independently  Yes       Intervention  Provide education and demonstration on how to check pulse in carotid and radial arteries.;Review the importance of being able to check your own pulse for safety during independent exercise       Expected Outcomes  Short Term: Able to explain why pulse checking is important during independent exercise;Long Term: Able to check pulse independently and accurately       Understanding of Exercise Prescription  Yes       Intervention  Provide education, explanation, and written materials on patient's individual exercise prescription       Expected Outcomes  Short Term: Able to explain program exercise prescription;Long Term: Able to explain home exercise prescription to exercise independently          Copy of goals given to participant.

## 2017-05-14 NOTE — Progress Notes (Signed)
   05/14/17 1546  6 Minute Walk  Phase Initial  Distance 630 feet  Walk Time 3.8 minutes  # of Rest Breaks 1 (test terminated at 3:48)  MPH 1.88  METS 1.35  RPE 9  Perceived Dyspnea  2  VO2 Peak 4.74  Symptoms Yes (comment)  Comments fatigue  Resting HR 78 bpm  Resting BP 132/74  Resting Oxygen Saturation  92 %  Exercise Oxygen Saturation  during 6 min walk 79 %  Max Ex. HR 116 bpm  Max Ex. BP 146/74  2 Minute Post BP 134/70  Interval HR  Interval Heart Rate? Yes  2 Minute HR 116  3 Minute HR 106  4 Minute HR 112 (test terminated at 3:48)  2 Minute Post HR 80  Interval Oxygen  Interval Oxygen? Yes  Baseline Oxygen Saturation % 92 %  1 Minute Oxygen Saturation % 84 %  1 Minute Liters of Oxygen 0 L (Room Air)  2 Minute Oxygen Saturation % 82 %  2 Minute Liters of Oxygen 0 L  3 Minute Oxygen Saturation % 81 % (80% 2:44)  3 Minute Liters of Oxygen 0 L  4 Minute Oxygen Saturation % 79 % (test terminated 3:48 and 78% 4min)  4 Minute Liters of Oxygen 0 L  5 Minute Oxygen Saturation % 85 %  5 Minute Liters of Oxygen 0 L  2 Minute Post Oxygen Saturation % 93 %  2 Minute Post Liters of Oxygen 83 L  [name] came to restart his rehab today. He performed his 6MWT with the above results.  He desaturated at 2 to 84% and the test was terminated at 3:48 for desaturation to 79%.  We will have him exercise on 2L continuous.  However, he will need to have portable oxygen for home use in order to exercise at home.  We will send a note to the physician.  Fabio PierceJessica Eri Platten, MA, ACSM RCEP, [facility] CenterCCRP 05/14/2017 4:06 PM

## 2017-05-14 NOTE — Progress Notes (Signed)
Pulmonary Individual Treatment Plan  Patient Details  Name: Mario [name] MRN: [id number] Date of Birth: Apr 03, 1946 Referring Provider:     Pulmonary Rehab from 05/14/2017 in [facility] Cardiac and Pulmonary Rehab  Referring Provider  Mickey Farber MD      Initial Encounter Date:    Pulmonary Rehab from 05/14/2017 in [facility] Cardiac and Pulmonary Rehab  Date  05/14/17  Referring Provider  Mickey Farber MD      Visit Diagnosis: Chronic obstructive pulmonary disease, unspecified COPD type (HCC)  Patient's Home Medications on Admission:  Current Outpatient Medications:  .  albuterol (PROVENTIL HFA;VENTOLIN HFA) 108 (90 Base) MCG/ACT inhaler, Inhale 2 puffs into the lungs as needed., Disp: , Rfl:  .  amLODipine (NORVASC) 5 MG tablet, Take 1 tablet by mouth daily., Disp: , Rfl:  .  diclofenac sodium (VOLTAREN) 1 % GEL, Apply 1 application topically 4 (four) times daily as needed., Disp: , Rfl:  .  edoxaban (SAVAYSA) 60 MG TABS tablet, Take 60 mg by mouth daily. cardiology, Disp: , Rfl:  .  fluticasone-salmeterol (ADVAIR HFA) 230-21 MCG/ACT inhaler, Inhale 2 puffs into the lungs 2 (two) times daily., Disp: , Rfl:  .  furosemide (LASIX) 80 MG tablet, Take 1 tablet by mouth daily., Disp: , Rfl:  .  gabapentin (NEURONTIN) 300 MG capsule, Take 1 capsule by mouth 3 (three) times daily., Disp: , Rfl:  .  glucose blood (ONE TOUCH ULTRA TEST) test strip, Use as instructed FOR TESTING three times daily.  E11.9, Disp: , Rfl:  .  HYDROcodone-acetaminophen (NORCO/VICODIN) 5-325 MG tablet, Take 1 tablet by mouth every 4 (four) hours as needed for moderate pain., Disp: 20 tablet, Rfl: 0 .  ipratropium-albuterol (DUONEB) 0.5-2.5 (3) MG/3ML SOLN, Inhale into the lungs., Disp: , Rfl:  .  losartan (COZAAR) 50 MG tablet, Take 1 tablet by mouth daily., Disp: , Rfl:  .  metFORMIN (GLUCOPHAGE) 1000 MG tablet, Take 1 tablet by mouth 2 (two) times daily., Disp: , Rfl:  .  sotalol (BETAPACE) 80 MG tablet, Take 1 tablet by  mouth 2 (two) times daily., Disp: , Rfl:  .  terazosin (HYTRIN) 5 MG capsule, Take 1 capsule by mouth every other day., Disp: , Rfl:  .  umeclidinium bromide (INCRUSE ELLIPTA) 62.5 MCG/INH AEPB, Inhale 1 puff into the lungs daily., Disp: , Rfl:   Past Medical History: Past Medical History:  Diagnosis Date  . COPD (chronic obstructive pulmonary disease) (HCC)   . Diabetes mellitus without complication (HCC)   . Diabetic neuropathy (HCC)   . Hypertension     Tobacco Use: Social History   Tobacco Use  Smoking Status Former Smoker  . Packs/day: 0.50  . Years: 1.00  . Pack years: 0.50  . Types: Cigarettes  . Last attempt to quit: 05/01/1969  . Years since quitting: 48.0  Smokeless Tobacco Never Used    Labs: Recent Hydrographic surveyor    Labs for ITP Cardiac and Pulmonary Rehab Latest Ref Rng & Units 02/15/2016   Hemoglobin A1c 4.8 - 5.6 % 6.8(H)       Pulmonary Assessment Scores: Pulmonary Assessment Scores    Row Name 05/14/17 1425         ADL UCSD   ADL Phase  Entry     SOB Score total  51     Rest  1     Walk  3     Stairs  3     Bath  1     Dress  2     Shop  3       CAT Score   CAT Score  18        Pulmonary Function Assessment: Pulmonary Function Assessment - 05/14/17 1438      Initial Spirometry Results   FVC%  36 %    FEV1%  40 %    FEV1/FVC Ratio  76.26    Comments  best of two, good patient effort      Post Bronchodilator Spirometry Results   FVC%  47.16 %    FEV1%  46.8 %    FEV1/FVC Ratio  74.69    Comments  best of two, good patient effort      Breath   Bilateral Breath Sounds  Decreased;Clear    Shortness of Breath  Yes;Limiting activity       Exercise Target Goals: Date: 05/14/17  Exercise Program Goal: Individual exercise prescription set with THRR, safety & activity barriers. Participant demonstrates ability to understand and report RPE using BORG scale, to self-measure pulse accurately, and to acknowledge the importance of  the exercise prescription.  Exercise Prescription Goal: Starting with aerobic activity 30 plus minutes a day, 3 days per week for initial exercise prescription. Provide home exercise prescription and guidelines that participant acknowledges understanding prior to discharge.  Activity Barriers & Risk Stratification: Activity Barriers & Cardiac Risk Stratification - 05/14/17 1550      Activity Barriers & Cardiac Risk Stratification   Activity Barriers  Arthritis;Joint Problems;Deconditioning;Muscular Weakness;Shortness of Breath;Balance Concerns bilateral knee pain, worse on left side       6 Minute Walk: 6 Minute Walk    Row Name 05/14/17 1546         6 Minute Walk   Phase  Initial     Distance  630 feet     Walk Time  3.8 minutes     # of Rest Breaks  1 test terminated at 3:48     MPH  1.88     METS  1.35     RPE  9     Perceived Dyspnea   2     VO2 Peak  4.74     Symptoms  Yes (comment)     Comments  fatigue     Resting HR  78 bpm     Resting BP  132/74     Resting Oxygen Saturation   92 %     Exercise Oxygen Saturation  during 6 min walk  79 %     Max Ex. HR  116 bpm     Max Ex. BP  146/74     2 Minute Post BP  134/70       Interval HR   2 Minute HR  116     3 Minute HR  106     4 Minute HR  112 test terminated at 3:48     2 Minute Post HR  80     Interval Heart Rate?  Yes       Interval Oxygen   Interval Oxygen?  Yes     Baseline Oxygen Saturation %  92 %     1 Minute Oxygen Saturation %  84 %     1 Minute Liters of Oxygen  0 L Room Air     2 Minute Oxygen Saturation %  82 %     2 Minute Liters of Oxygen  0 L     3 Minute Oxygen Saturation %  81 %  80% 2:44     3 Minute Liters of Oxygen  0 L     4 Minute Oxygen Saturation %  79 % test terminated 3:48 and 78%     4 Minute Liters of Oxygen  0 L     5 Minute Oxygen Saturation %  85 %     5 Minute Liters of Oxygen  0 L     2 Minute Post Oxygen Saturation %  93 %     2 Minute Post Liters of Oxygen  83 L        Oxygen Initial Assessment: Oxygen Initial Assessment - 05/14/17 1427      Home Oxygen   Home Oxygen Device  Portable Concentrator;E-Tanks    Sleep Oxygen Prescription  Continuous    Liters per minute  3.5    Home Exercise Oxygen Prescription  None    Liters per minute  3.5    Home at Rest Exercise Oxygen Prescription  None    Liters per minute  --    Compliance with Home Oxygen Use  Yes      Initial 6 min Walk   Oxygen Used  None      Program Oxygen Prescription   Program Oxygen Prescription  None      Intervention   Short Term Goals  To learn and demonstrate proper use of respiratory medications;To learn and understand importance of maintaining oxygen saturations>88%;To learn and exhibit compliance with exercise, home and travel O2 prescription;To learn and understand importance of monitoring SPO2 with pulse oximeter and demonstrate accurate use of the pulse oximeter.;To learn and demonstrate proper pursed lip breathing techniques or other breathing techniques.    Long  Term Goals  Exhibits compliance with exercise, home and travel O2 prescription;Verbalizes importance of monitoring SPO2 with pulse oximeter and return demonstration;Maintenance of O2 saturations>88%;Exhibits proper breathing techniques, such as pursed lip breathing or other method taught during program session;Compliance with respiratory medication;Demonstrates proper use of MDI's       Oxygen Re-Evaluation:   Oxygen Discharge (Final Oxygen Re-Evaluation):   Initial Exercise Prescription: Initial Exercise Prescription - 05/14/17 1500      Date of Initial Exercise RX and Referring Provider   Date  05/14/17    Referring Provider  Mickey Farber MD      Oxygen   Oxygen  Continuous    Liters  2      Treadmill   MPH  1.8    Grade  0    Minutes  15    METs  2.38      Recumbant Elliptical   Level  1    RPM  50    Minutes  15    METs  2      T5 Nustep   Level  1    SPM  80    Minutes  15     METs  2      Prescription Details   Frequency (times per week)  3    Duration  Progress to 45 minutes of aerobic exercise without signs/symptoms of physical distress      Intensity   THRR 40-80% of Max Heartrate  106-135    Ratings of Perceived Exertion  11-13    Perceived Dyspnea  0-4      Progression   Progression  Continue to progress workloads to maintain intensity without signs/symptoms of physical distress.      Resistance Training   Training Prescription  Yes  Weight  3 lbs    Reps  10-15       Perform Capillary Blood Glucose checks as needed.  Exercise Prescription Changes: Exercise Prescription Changes    Row Name 05/14/17 1500             Response to Exercise   Blood Pressure (Admit)  132/74       Blood Pressure (Exercise)  146/74       Blood Pressure (Exit)  134/70       Heart Rate (Admit)  78 bpm       Heart Rate (Exercise)  116 bpm       Heart Rate (Exit)  83 bpm       Oxygen Saturation (Admit)  92 %       Oxygen Saturation (Exercise)  79 %       Oxygen Saturation (Exit)  93 %       Rating of Perceived Exertion (Exercise)  9       Perceived Dyspnea (Exercise)  2       Symptoms  fatigue       Comments  walk test results          Exercise Comments:   Exercise Goals and Review: Exercise Goals    Row Name 05/14/17 1554             Exercise Goals   Increase Physical Activity  Yes       Intervention  Provide advice, education, support and counseling about physical activity/exercise needs.;Develop an individualized exercise prescription for aerobic and resistive training based on initial evaluation findings, risk stratification, comorbidities and participant's personal goals.       Expected Outcomes  Achievement of increased cardiorespiratory fitness and enhanced flexibility, muscular endurance and strength shown through measurements of functional capacity and personal statement of participant.       Increase Strength and Stamina  Yes        Intervention  Provide advice, education, support and counseling about physical activity/exercise needs.;Develop an individualized exercise prescription for aerobic and resistive training based on initial evaluation findings, risk stratification, comorbidities and participant's personal goals.       Expected Outcomes  Achievement of increased cardiorespiratory fitness and enhanced flexibility, muscular endurance and strength shown through measurements of functional capacity and personal statement of participant.       Able to understand and use rate of perceived exertion (RPE) scale  Yes       Intervention  Provide education and explanation on how to use RPE scale       Expected Outcomes  Short Term: Able to use RPE daily in rehab to express subjective intensity level;Long Term:  Able to use RPE to guide intensity level when exercising independently       Able to understand and use Dyspnea scale  Yes       Intervention  Provide education and explanation on how to use Dyspnea scale       Expected Outcomes  Short Term: Able to use Dyspnea scale daily in rehab to express subjective sense of shortness of breath during exertion;Long Term: Able to use Dyspnea scale to guide intensity level when exercising independently       Knowledge and understanding of Target Heart Rate Range (THRR)  Yes       Intervention  Provide education and explanation of THRR including how the numbers were predicted and where they are located for reference       Expected Outcomes  Short Term: Able to state/look up THRR;Long Term: Able to use THRR to govern intensity when exercising independently;Short Term: Able to use daily as guideline for intensity in rehab       Able to check pulse independently  Yes       Intervention  Provide education and demonstration on how to check pulse in carotid and radial arteries.;Review the importance of being able to check your own pulse for safety during independent exercise       Expected Outcomes   Short Term: Able to explain why pulse checking is important during independent exercise;Long Term: Able to check pulse independently and accurately       Understanding of Exercise Prescription  Yes       Intervention  Provide education, explanation, and written materials on patient's individual exercise prescription       Expected Outcomes  Short Term: Able to explain program exercise prescription;Long Term: Able to explain home exercise prescription to exercise independently          Exercise Goals Re-Evaluation :   Discharge Exercise Prescription (Final Exercise Prescription Changes): Exercise Prescription Changes - 05/14/17 1500      Response to Exercise   Blood Pressure (Admit)  132/74    Blood Pressure (Exercise)  146/74    Blood Pressure (Exit)  134/70    Heart Rate (Admit)  78 bpm    Heart Rate (Exercise)  116 bpm    Heart Rate (Exit)  83 bpm    Oxygen Saturation (Admit)  92 %    Oxygen Saturation (Exercise)  79 %    Oxygen Saturation (Exit)  93 %    Rating of Perceived Exertion (Exercise)  9    Perceived Dyspnea (Exercise)  2    Symptoms  fatigue    Comments  walk test results       Nutrition:  Target Goals: Understanding of nutrition guidelines, daily intake of sodium 1500mg , cholesterol 200mg , calories 30% from fat and 7% or less from saturated fats, daily to have 5 or more servings of fruits and vegetables.  Biometrics: Pre Biometrics - 05/14/17 1557      Pre Biometrics   Height  5' 9.3" (1.76 m)    Weight  258 lb 14.4 oz (117.4 kg)    Waist Circumference  48.75 inches    Hip Circumference  43.5 inches    Waist to Hip Ratio  1.12 %    BMI (Calculated)  37.91        Nutrition Therapy Plan and Nutrition Goals: Nutrition Therapy & Goals - 05/14/17 1437      Personal Nutrition Goals   Comments  Is intersted in meeting with the dietician      Intervention Plan   Intervention  Prescribe, educate and counsel regarding individualized specific dietary  modifications aiming towards targeted core components such as weight, hypertension, lipid management, diabetes, heart failure and other comorbidities.;Nutrition handout(s) given to patient.    Expected Outcomes  Short Term Goal: Understand basic principles of dietary content, such as calories, fat, sodium, cholesterol and nutrients.;Long Term Goal: Adherence to prescribed nutrition plan.       Nutrition Discharge: Rate Your Plate Scores:   Nutrition Goals Re-Evaluation:   Nutrition Goals Discharge (Final Nutrition Goals Re-Evaluation):   Psychosocial: Target Goals: Acknowledge presence or absence of significant depression and/or stress, maximize coping skills, provide positive support system. Participant is able to verbalize types and ability to use techniques and skills needed for reducing stress and depression.  Initial Review & Psychosocial Screening: Initial Psych Review & Screening - 05/14/17 1422      Initial Review   Current issues with  Current Stress Concerns    Source of Stress Concerns  Chronic Illness    Comments  Patients COPD is his main factor      Family Dynamics   Good Support System?  Yes    Comments  His girlfriend is good for his support.      Barriers   Psychosocial barriers to participate in program  The patient should benefit from training in stress management and relaxation.      Screening Interventions   Interventions  Encouraged to exercise;Program counselor consult;To provide support and resources with identified psychosocial needs;Provide feedback about the scores to participant;Yes    Expected Outcomes  Long Term goal: The participant improves quality of Life and PHQ9 Scores as seen by post scores and/or verbalization of changes;Short Term goal: Identification and review with participant of any Quality of Life or Depression concerns found by scoring the questionnaire.;Long Term Goal: Stressors or current issues are controlled or eliminated.;Short Term  goal: Utilizing psychosocial counselor, staff and physician to assist with identification of specific Stressors or current issues interfering with healing process. Setting desired goal for each stressor or current issue identified.       Quality of Life Scores:   PHQ-9: Recent Review Flowsheet Data    Depression screen [facility] 2/9 05/14/2017 04/04/2016 02/15/2016   Decreased Interest 2 2 0   Down, Depressed, Hopeless 0 2 0   PHQ - 2 Score 2 4 0   Altered sleeping 3 1 -   Tired, decreased energy 3 3 -   Change in appetite 2 1 -   Feeling bad or failure about yourself  3 0 -   Trouble concentrating 2 1 -   Moving slowly or fidgety/restless 0 0 -   Suicidal thoughts 0 0 -   PHQ-9 Score 15 10 -   Difficult doing work/chores Somewhat difficult Somewhat difficult -     Interpretation of Total Score  Total Score Depression Severity:  1-4 = Minimal depression, 5-9 = Mild depression, 10-14 = Moderate depression, 15-19 = Moderately severe depression, 20-27 = Severe depression   Psychosocial Evaluation and Intervention:   Psychosocial Re-Evaluation:   Psychosocial Discharge (Final Psychosocial Re-Evaluation):   Education: Education Goals: Education classes will be provided on a weekly basis, covering required topics. Participant will state understanding/return demonstration of topics presented.  Learning Barriers/Preferences: Learning Barriers/Preferences - 05/14/17 1441      Learning Barriers/Preferences   Learning Barriers  None    Learning Preferences  None       Education Topics: Initial Evaluation Education: - Verbal, written and demonstration of respiratory meds, RPE/PD scales, oximetry and breathing techniques. Instruction on use of nebulizers and MDIs: cleaning and proper use, rinsing mouth with steroid doses and importance of monitoring MDI activations.   Pulmonary Rehab from 05/14/2017 in [facility] Cardiac and Pulmonary Rehab  Date  05/14/17  Educator  [facility]  Instruction Review  Code  1- Verbalizes Understanding      General Nutrition Guidelines/Fats and Fiber: -Group instruction provided by verbal, written material, models and posters to present the general guidelines for heart healthy nutrition. Gives an explanation and review of dietary fats and fiber.   Controlling Sodium/Reading Food Labels: -Group verbal and written material supporting the discussion of sodium use in heart healthy nutrition. Review and explanation with models, verbal and written materials for utilization  of the food label.   Exercise Physiology & Risk Factors: - Group verbal and written instruction with models to review the exercise physiology of the cardiovascular system and associated critical values. Details cardiovascular disease risk factors and the goals associated with each risk factor.   Pulmonary Rehab from 05/03/2016 in [facility] Cardiac and Pulmonary Rehab  Date  05/03/16  Educator  [facility]  Instruction Review Code (retired)  2- meets goals/outcomes      Aerobic Exercise & Resistance Training: - Gives group verbal and written discussion on the health impact of inactivity. On the components of aerobic and resistive training programs and the benefits of this training and how to safely progress through these programs.   Flexibility, Balance, General Exercise Guidelines: - Provides group verbal and written instruction on the benefits of flexibility and balance training programs. Provides general exercise guidelines with specific guidelines to those with heart or lung disease. Demonstration and skill practice provided.   Pulmonary Rehab from 05/03/2016 in [facility] Cardiac and Pulmonary Rehab  Date  04/19/16  Educator  [facility]  Instruction Review Code (retired)  2- meets goals/outcomes      Stress Management: - Provides group verbal and written instruction about the health risks of elevated stress, cause of high stress, and healthy ways to reduce stress.   Depression: - Provides group verbal  and written instruction on the correlation between heart/lung disease and depressed mood, treatment options, and the stigmas associated with seeking treatment.   Exercise & Equipment Safety: - Individual verbal instruction and demonstration of equipment use and safety with use of the equipment.   Pulmonary Rehab from 05/14/2017 in [facility] Cardiac and Pulmonary Rehab  Date  05/14/17  Educator  [facility]  Instruction Review Code  1- Verbalizes Understanding      Infection Prevention: - Provides verbal and written material to individual with discussion of infection control including proper hand washing and proper equipment cleaning during exercise session.   Pulmonary Rehab from 05/14/2017 in [facility] Cardiac and Pulmonary Rehab  Date  05/14/17  Educator  [facility]  Instruction Review Code  1- Verbalizes Understanding      Falls Prevention: - Provides verbal and written material to individual with discussion of falls prevention and safety.   Pulmonary Rehab from 05/14/2017 in [facility] Cardiac and Pulmonary Rehab  Date  05/14/17  Educator  [facility]  Instruction Review Code  1- Verbalizes Understanding      Diabetes: - Individual verbal and written instruction to review signs/symptoms of diabetes, desired ranges of glucose level fasting, after meals and with exercise. Advice that pre and post exercise glucose checks will be done for 3 sessions at entry of program.   Pulmonary Rehab from 05/03/2016 in [facility] Cardiac and Pulmonary Rehab  Date  04/04/16  Educator  [facility]  Instruction Review Code (retired)  2- meets goals/outcomes      Chronic Lung Diseases: - Group verbal and written instruction to review new updates, new respiratory medications, new advancements in procedures and treatments. Provide informative websites and "800" numbers of self-education.   Lung Procedures: - Group verbal and written instruction to describe testing methods done to diagnose lung disease. Review the outcome of test results. Describe the  treatment choices: Pulmonary Function Tests, ABGs and oximetry.   Energy Conservation: - Provide group verbal and written instruction for methods to conserve energy, plan and organize activities. Instruct on pacing techniques, use of adaptive equipment and posture/positioning to relieve shortness of breath.   Triggers: - Group verbal and written instruction  to review types of environmental controls: home humidity, furnaces, filters, dust mite/pet prevention, HEPA vacuums. To discuss weather changes, air quality and the benefits of nasal washing.   Pulmonary Rehab from 05/03/2016 in [facility] Cardiac and Pulmonary Rehab  Date  04/26/16  Educator  [facility]  Instruction Review Code (retired)  2- meets goals/outcomes      Exacerbations: - Group verbal and written instruction to provide: warning signs, infection symptoms, calling MD promptly, preventive modes, and value of vaccinations. Review: effective airway clearance, coughing and/or vibration techniques. Create an Sport and exercise psychologist.   Oxygen: - Individual and group verbal and written instruction on oxygen therapy. Includes supplement oxygen, available portable oxygen systems, continuous and intermittent flow rates, oxygen safety, concentrators, and Medicare reimbursement for oxygen.   Pulmonary Rehab from 05/03/2016 in [facility] Cardiac and Pulmonary Rehab  Date  04/04/16  Educator  [facility]  Instruction Review Code (retired)  2- meets goals/outcomes      Respiratory Medications: - Group verbal and written instruction to review medications for lung disease. Drug class, frequency, complications, importance of spacers, rinsing mouth after steroid MDI's, and proper cleaning methods for nebulizers.   AED/CPR: - Group verbal and written instruction with the use of models to demonstrate the basic use of the AED with the basic ABC's of resuscitation.   Breathing Retraining: - Provides individuals verbal and written instruction on purpose, frequency, and proper  technique of diaphragmatic breathing and pursed-lipped breathing. Applies individual practice skills.   Pulmonary Rehab from 05/14/2017 in [facility] Cardiac and Pulmonary Rehab  Date  05/14/17  Educator  [facility]  Instruction Review Code  1- Verbalizes Understanding      Anatomy and Physiology of the Lungs: - Group verbal and written instruction with the use of models to provide basic lung anatomy and physiology related to function, structure and complications of lung disease.   Anatomy & Physiology of the Heart: - Group verbal and written instruction and models provide basic cardiac anatomy and physiology, with the coronary electrical and arterial systems. Review of: AMI, Angina, Valve disease, Heart Failure, Cardiac Arrhythmia, Pacemakers, and the ICD.   Heart Failure: - Group verbal and written instruction on the basics of heart failure: signs/symptoms, treatments, explanation of ejection fraction, enlarged heart and cardiomyopathy.   Sleep Apnea: - Individual verbal and written instruction to review Obstructive Sleep Apnea. Review of risk factors, methods for diagnosing and types of masks and machines for OSA.   Anxiety: - Provides group, verbal and written instruction on the correlation between heart/lung disease and anxiety, treatment options, and management of anxiety.   Relaxation: - Provides group, verbal and written instruction about the benefits of relaxation for patients with heart/lung disease. Also provides patients with examples of relaxation techniques.   Cardiac Medications: - Group verbal and written instruction to review commonly prescribed medications for heart disease. Reviews the medication, class of the drug, and side effects.   Know Your Numbers: -Group verbal and written instruction about important numbers in your health.  Review of Cholesterol, Blood Pressure, Diabetes, and BMI and the role they play in your overall health.   Other: -Provides group and verbal  instruction on various topics (see comments)    Knowledge Questionnaire Score: Knowledge Questionnaire Score - 05/14/17 1424      Knowledge Questionnaire Score   Pre Score  14/18 reviewed with patient        Core Components/Risk Factors/Patient Goals at Admission: Personal Goals and Risk Factors at Admission - 05/14/17 1435  Core Components/Risk Factors/Patient Goals on Admission    Weight Management  Yes;Weight Loss    Intervention  Weight Management: Develop a combined nutrition and exercise program designed to reach desired caloric intake, while maintaining appropriate intake of nutrient and fiber, sodium and fats, and appropriate energy expenditure required for the weight goal.;Weight Management: Provide education and appropriate resources to help participant work on and attain dietary goals.    Admit Weight  258 lb 14.4 oz (117.4 kg)    Goal Weight: Short Term  253 lb (114.8 kg)    Goal Weight: Long Term  248 lb (112.5 kg)    Expected Outcomes  Short Term: Continue to assess and modify interventions until short term weight is achieved;Long Term: Adherence to nutrition and physical activity/exercise program aimed toward attainment of established weight goal;Understanding recommendations for meals to include 15-35% energy as protein, 25-35% energy from fat, 35-60% energy from carbohydrates, less than 200mg  of dietary cholesterol, 20-35 gm of total fiber daily;Understanding of distribution of calorie intake throughout the day with the consumption of 4-5 meals/snacks;Weight Loss: Understanding of general recommendations for a balanced deficit meal plan, which promotes 1-2 lb weight loss per week and includes a negative energy balance of [phone] kcal/d    Improve shortness of breath with ADL's  Yes    Intervention  Provide education, individualized exercise plan and daily activity instruction to help decrease symptoms of SOB with activities of daily living.    Expected Outcomes  Short  Term: Achieves a reduction of symptoms when performing activities of daily living.    Diabetes  Yes    Intervention  Provide education about signs/symptoms and action to take for hypo/hyperglycemia.;Provide education about proper nutrition, including hydration, and aerobic/resistive exercise prescription along with prescribed medications to achieve blood glucose in normal ranges: Fasting glucose 65-99 mg/dL    Expected Outcomes  Short Term: Participant verbalizes understanding of the signs/symptoms and immediate care of hyper/hypoglycemia, proper foot care and importance of medication, aerobic/resistive exercise and nutrition plan for blood glucose control.;Long Term: Attainment of HbA1C < 7%.    Heart Failure  Yes    Intervention  Provide a combined exercise and nutrition program that is supplemented with education, support and counseling about heart failure. Directed toward relieving symptoms such as shortness of breath, decreased exercise tolerance, and extremity edema.    Expected Outcomes  Improve functional capacity of life;Short term: Attendance in program 2-3 days a week with increased exercise capacity. Reported lower sodium intake. Reported increased fruit and vegetable intake. Reports medication compliance.;Short term: Daily weights obtained and reported for increase. Utilizing diuretic protocols set by physician.;Long term: Adoption of self-care skills and reduction of barriers for early signs and symptoms recognition and intervention leading to self-care maintenance.    Hypertension  -- states he is not on blood pressure medication       Core Components/Risk Factors/Patient Goals Review:    Core Components/Risk Factors/Patient Goals at Discharge (Final Review):    ITP Comments: ITP Comments    Row Name 05/14/17 1357           ITP Comments  Medical Evaluation completed. Chart sent for review and changes to Dr. Bethann Punches Director of [name]. Diagnosis can be found in [facility]  encounter 05/14/16          Comments: Initial ITP

## 2017-05-21 DIAGNOSIS — J449 Chronic obstructive pulmonary disease, unspecified: Secondary | ICD-10-CM | POA: Diagnosis not present

## 2017-05-21 LAB — GLUCOSE, CAPILLARY: GLUCOSE-CAPILLARY: 150 mg/dL — AB (ref 65–99)

## 2017-05-21 NOTE — Progress Notes (Signed)
Daily Session Note  Patient Details  Name: Mario [name] MRN: [id number] Date of Birth: January 25, 1946 Referring Provider:     Pulmonary Rehab from 05/14/2017 in [facility] Cardiac and Pulmonary Rehab  Referring Provider  Ezequiel Kayser MD      Encounter Date: 05/21/2017  Check In: Session Check In - 05/21/17 1017      Check-In   Location  [facility]-Cardiac & Pulmonary Rehab    Staff Present  Nada Maclachlan, BA, ACSM CEP, Exercise Physiologist;Kelly Amedeo Plenty, BS, ACSM CEP, Exercise Physiologist;Dixie Coppa Flavia Shipper    Supervising physician immediately available to respond to emergencies  [name] immediately available ER MD    Physician(s)  Dr. Cinda Quest and Rifenbark    Medication changes reported      No    Fall or balance concerns reported     No    Tobacco Cessation  No Change patient does not smoke    Warm-up and Cool-down  Performed as group-led instruction    Resistance Training Performed  Yes    VAD Patient?  No      Pain Assessment   Currently in Pain?  No/denies          Social History   Tobacco Use  Smoking Status Former Smoker  . Packs/day: 0.50  . Years: 1.00  . Pack years: 0.50  . Types: Cigarettes  . Last attempt to quit: 05/01/1969  . Years since quitting: 48.0  Smokeless Tobacco Never Used    Goals Met:  Exercise tolerated well Personal goals reviewed No report of cardiac concerns or symptoms Strength training completed today  Goals Unmet:  Not Applicable  Comments: First full day of exercise!  Patient was oriented to gym and equipment including functions, settings, policies, and procedures.  Patient's individual exercise prescription and treatment plan were reviewed.  All starting workloads were established based on the results of the 6 minute walk test done at initial orientation visit.  The plan for exercise progression was also introduced and progression will be customized based on patient's performance and goals.   Dr. Emily Filbert is Medical Director for  [place] and [name].

## 2017-05-23 DIAGNOSIS — J449 Chronic obstructive pulmonary disease, unspecified: Secondary | ICD-10-CM | POA: Diagnosis not present

## 2017-05-23 LAB — GLUCOSE, CAPILLARY
Glucose-Capillary: 144 mg/dL — ABNORMAL HIGH (ref 65–99)
Glucose-Capillary: 84 mg/dL (ref 65–99)

## 2017-05-23 NOTE — Progress Notes (Signed)
Daily Session Note  Patient Details  Name: Mario [name] MRN: [id number] Date of Birth: 1945-11-09 Referring Provider:     Pulmonary Rehab from 05/14/2017 in [facility] Cardiac and Pulmonary Rehab  Referring Provider  Ezequiel Kayser MD      Encounter Date: 05/23/2017  Check In: Session Check In - 05/23/17 1007      Check-In   Location  [facility]-Cardiac & Pulmonary Rehab    Staff Present  Nada Maclachlan, BA, ACSM CEP, Exercise Physiologist;Andersen Mckiver Darrin Nipper, [place], RCEP, CCRP, Exercise Physiologist    Supervising physician immediately available to respond to emergencies  [name] immediately available ER MD    Physician(s)  Dr. Mariea Clonts and Jimmye Norman    Medication changes reported      No    Fall or balance concerns reported     No    Warm-up and Cool-down  Performed as group-led instruction    Resistance Training Performed  Yes    VAD Patient?  No      Pain Assessment   Currently in Pain?  No/denies          Social History   Tobacco Use  Smoking Status Former Smoker  . Packs/day: 0.50  . Years: 1.00  . Pack years: 0.50  . Types: Cigarettes  . Last attempt to quit: 05/01/1969  . Years since quitting: 48.0  Smokeless Tobacco Never Used    Goals Met:  Independence with exercise equipment Exercise tolerated well No report of cardiac concerns or symptoms Strength training completed today  Goals Unmet:  Not Applicable  Comments: Pt able to follow exercise prescription today without complaint.  Will continue to monitor for progression.   Dr. Emily Filbert is Medical Director for [place] and [name].

## 2017-05-25 DIAGNOSIS — J449 Chronic obstructive pulmonary disease, unspecified: Secondary | ICD-10-CM | POA: Diagnosis not present

## 2017-05-25 LAB — GLUCOSE, CAPILLARY
GLUCOSE-CAPILLARY: 107 mg/dL — AB (ref 65–99)
Glucose-Capillary: 132 mg/dL — ABNORMAL HIGH (ref 65–99)

## 2017-05-25 NOTE — Progress Notes (Signed)
Daily Session Note  Patient Details  Name: Masaki Rothbauer MRN: [id number] Date of Birth: 07-May-1945 Referring Provider:     Pulmonary Rehab from 05/14/2017 in [facility] Card[facility] Pulmonary Rehab  Referring Provider  Ezequiel Kayser MD      Encounter Date: 05/25/2017  Check In: Session Check In - 05/25/17 0952      Check-In   Location  [facility]-Cardiac & Pulmonary Rehab    Staff Present  Nada Maclachlan, BA, ACSM CEP, Exercise Physiologist;Mandi [place], BS, PEC;Kahlel Peake [employer] physician immediately available to respond to emergencies  [name] immediately available ER MD    Physician(s)  Dr. Mariea Clonts and Jacqualine Code    Medication changes reported      No    Fall or balance concerns reported     No    Warm-up and Cool-down  Performed as group-led instruction    Resistance Training Performed  Yes    VAD Patient?  No      Pain Assessment   Currently in Pain?  No/denies          Social History   Tobacco Use  Smoking Status Former Smoker  . Packs/day: 0.50  . Years: 1.00  . Pack years: 0.50  . Types: Cigarettes  . Last attempt to quit: 05/01/1969  . Years since quitting: 48.0  Smokeless Tobacco Never Used    Goals Met:  Independence with exercise equipment Exercise tolerated well No report of cardiac concerns or symptoms Strength training completed today  Goals Unmet:  Not Applicable  Comments: Pt able to follow exercise prescription today without complaint.  Will continue to monitor for progression.   Dr. Emily Filbert is Medical Director for [place] and [name].

## 2017-05-28 ENCOUNTER — Telehealth: Payer: Self-pay

## 2017-05-28 DIAGNOSIS — J449 Chronic obstructive pulmonary disease, unspecified: Secondary | ICD-10-CM

## 2017-05-28 NOTE — Telephone Encounter (Signed)
Mario [name] called to say that he is feeling sick and would not be in [name] this morning.

## 2017-05-31 NOTE — Progress Notes (Signed)
Documentation encounter

## 2017-06-01 ENCOUNTER — Encounter: Payer: Medicare Other | Attending: Internal Medicine

## 2017-06-01 ENCOUNTER — Telehealth: Payer: Self-pay | Admitting: *Deleted

## 2017-06-01 DIAGNOSIS — J449 Chronic obstructive pulmonary disease, unspecified: Secondary | ICD-10-CM | POA: Insufficient documentation

## 2017-06-01 DIAGNOSIS — I509 Heart failure, unspecified: Secondary | ICD-10-CM | POA: Insufficient documentation

## 2017-06-01 DIAGNOSIS — R0902 Hypoxemia: Secondary | ICD-10-CM | POA: Insufficient documentation

## 2017-06-01 NOTE — Telephone Encounter (Signed)
[name] called to ask if he should come to class after having a SOB episode last night and feeling like he has some extra fluid on board. He took his Lasix according to his prescription. Patient was advised to call his doctor to see if he needs to be seen. Currently he is asymptomatic. Safety and when to seek emergency help reviewed with patient.

## 2017-06-08 ENCOUNTER — Telehealth: Payer: Self-pay

## 2017-06-08 DIAGNOSIS — J449 Chronic obstructive pulmonary disease, unspecified: Secondary | ICD-10-CM

## 2017-06-08 NOTE — Telephone Encounter (Signed)
Mario [name] called to say he went to the doctors on Monday. He had a high Co2. The doctor wants him to be on home oxygen at 2 liters continuous. He is going to advanced today to talk with them about home oxygen. He has been feeling tired and we informed him to come to rehab and exercise to get his energy back. He states that his car is in the shop and he will not be in class today but states he will be in Class next week.

## 2017-06-11 DIAGNOSIS — J449 Chronic obstructive pulmonary disease, unspecified: Secondary | ICD-10-CM

## 2017-06-11 NOTE — Progress Notes (Signed)
Pulmonary Individual Treatment Plan  Patient Details  Name: Mario [name] MRN: [id number] Date of Birth: 1945/08/05 Referring Provider:     Pulmonary Rehab from 05/14/2017 in [facility] Cardiac and Pulmonary Rehab  Referring Provider  Ezequiel Kayser MD      Initial Encounter Date:    Pulmonary Rehab from 05/14/2017 in [facility] Cardiac and Pulmonary Rehab  Date  05/14/17  Referring Provider  Ezequiel Kayser MD      Visit Diagnosis: Chronic obstructive pulmonary disease, unspecified COPD type ([place])  Patient's Home Medications on Admission:  Current Outpatient Medications:  .  albuterol (PROVENTIL HFA;VENTOLIN HFA) 108 (90 Base) MCG/ACT inhaler, Inhale 2 puffs into the lungs as needed., Disp: , Rfl:  .  amLODipine (NORVASC) 5 MG tablet, Take 1 tablet by mouth daily., Disp: , Rfl:  .  diclofenac sodium (VOLTAREN) 1 % GEL, Apply 1 application topically 4 (four) times daily as needed., Disp: , Rfl:  .  edoxaban (SAVAYSA) 60 MG TABS tablet, Take 60 mg by mouth daily. cardiology, Disp: , Rfl:  .  fluticasone-salmeterol (ADVAIR HFA) 230-21 MCG/ACT inhaler, Inhale 2 puffs into the lungs 2 (two) times daily., Disp: , Rfl:  .  furosemide (LASIX) 80 MG tablet, Take 1 tablet by mouth daily., Disp: , Rfl:  .  gabapentin (NEURONTIN) 300 MG capsule, Take 1 capsule by mouth 3 (three) times daily., Disp: , Rfl:  .  glucose blood (ONE TOUCH ULTRA TEST) test strip, Use as instructed FOR TESTING three times daily.  E11.9, Disp: , Rfl:  .  HYDROcodone-acetaminophen (NORCO/VICODIN) 5-325 MG tablet, Take 1 tablet by mouth every 4 (four) hours as needed for moderate pain., Disp: 20 tablet, Rfl: 0 .  ipratropium-albuterol (DUONEB) 0.5-2.5 (3) MG/3ML SOLN, Inhale into the lungs., Disp: , Rfl:  .  losartan (COZAAR) 50 MG tablet, Take 1 tablet by mouth daily., Disp: , Rfl:  .  metFORMIN (GLUCOPHAGE) 1000 MG tablet, Take 1 tablet by mouth 2 (two) times daily., Disp: , Rfl:  .  sotalol (BETAPACE) 80 MG tablet, Take 1 tablet by  mouth 2 (two) times daily., Disp: , Rfl:  .  terazosin (HYTRIN) 5 MG capsule, Take 1 capsule by mouth every other day., Disp: , Rfl:  .  umeclidinium bromide (INCRUSE ELLIPTA) 62.5 MCG/INH AEPB, Inhale 1 puff into the lungs daily., Disp: , Rfl:   Past Medical History: Past Medical History:  Diagnosis Date  . COPD (chronic obstructive pulmonary disease) ([place])   . Diabetes mellitus without complication ([place])   . Diabetic neuropathy ([place])   . Hypertension     Tobacco Use: Social History   Tobacco Use  Smoking Status Former Smoker  . Packs/day: 0.50  . Years: 1.00  . Pack years: 0.50  . Types: Cigarettes  . Last attempt to quit: 05/01/1969  . Years since quitting: 48.1  Smokeless Tobacco Never Used    Labs: Recent Chemical engineer    Labs for ITP Cardiac and Pulmonary Rehab Latest Ref Rng & Units 02/15/2016   Hemoglobin A1c 4.8 - 5.6 % 6.8(H)       Pulmonary Assessment Scores: Pulmonary Assessment Scores    Row Name 05/14/17 1425         ADL UCSD   ADL Phase  Entry     SOB Score total  51     Rest  1     Walk  3     Stairs  3     Bath  1     Dress  2     Shop  3       CAT Score   CAT Score  18        Pulmonary Function Assessment: Pulmonary Function Assessment - 05/14/17 1438      Initial Spirometry Results   FVC%  36 %    FEV1%  40 %    FEV1/FVC Ratio  76.26    Comments  best of two, good patient effort      Post Bronchodilator Spirometry Results   FVC%  47.16 %    FEV1%  46.8 %    FEV1/FVC Ratio  74.69    Comments  best of two, good patient effort      Breath   Bilateral Breath Sounds  Decreased;Clear    Shortness of Breath  Yes;Limiting activity       Exercise Target Goals:    Exercise Program Goal: Individual exercise prescription set using results from initial 6 min walk test and THRR while considering  patient's activity barriers and safety.    Exercise Prescription Goal: Initial exercise prescription builds to 30-45 minutes a  day of aerobic activity, 2-3 days per week.  Home exercise guidelines will be given to patient during program as part of exercise prescription that the participant will acknowledge.  Activity Barriers & Risk Stratification: Activity Barriers & Cardiac Risk Stratification - 05/14/17 1550      Activity Barriers & Cardiac Risk Stratification   Activity Barriers  Arthritis;Joint Problems;Deconditioning;Muscular Weakness;Shortness of Breath;Balance Concerns bilateral knee pain, worse on left side       6 Minute Walk: 6 Minute Walk    Row Name 05/14/17 1546         6 Minute Walk   Phase  Initial     Distance  630 feet     Walk Time  3.8 minutes     # of Rest Breaks  1 test terminated at 3:48     MPH  1.88     METS  1.35     RPE  9     Perceived Dyspnea   2     VO2 Peak  4.74     Symptoms  Yes (comment)     Comments  fatigue     Resting HR  78 bpm     Resting BP  132/74     Resting Oxygen Saturation   92 %     Exercise Oxygen Saturation  during 6 min walk  79 %     Max Ex. HR  116 bpm     Max Ex. BP  146/74     2 Minute Post BP  134/70       Interval HR   2 Minute HR  116     3 Minute HR  106     4 Minute HR  112 test terminated at 3:48     2 Minute Post HR  80     Interval Heart Rate?  Yes       Interval Oxygen   Interval Oxygen?  Yes     Baseline Oxygen Saturation %  92 %     1 Minute Oxygen Saturation %  84 %     1 Minute Liters of Oxygen  0 L Room Air     2 Minute Oxygen Saturation %  82 %     2 Minute Liters of Oxygen  0 L     3 Minute Oxygen Saturation %  81 % 80% 2:44  3 Minute Liters of Oxygen  0 L     4 Minute Oxygen Saturation %  79 % test terminated 3:48 and 78% 42mn     4 Minute Liters of Oxygen  0 L     5 Minute Oxygen Saturation %  85 %     5 Minute Liters of Oxygen  0 L     2 Minute Post Oxygen Saturation %  93 %     2 Minute Post Liters of Oxygen  83 L       Oxygen Initial Assessment: Oxygen Initial Assessment - 05/14/17 1427      Home Oxygen    Home Oxygen Device  Portable Concentrator;E-Tanks    Sleep Oxygen Prescription  Continuous    Liters per minute  3.5    Home Exercise Oxygen Prescription  None    Liters per minute  3.5    Home at Rest Exercise Oxygen Prescription  None    Liters per minute  --    Compliance with Home Oxygen Use  Yes      Initial 6 min Walk   Oxygen Used  None      Program Oxygen Prescription   Program Oxygen Prescription  None      Intervention   Short Term Goals  To learn and demonstrate proper use of respiratory medications;To learn and understand importance of maintaining oxygen saturations>88%;To learn and exhibit compliance with exercise, home and travel O2 prescription;To learn and understand importance of monitoring SPO2 with pulse oximeter and demonstrate accurate use of the pulse oximeter.;To learn and demonstrate proper pursed lip breathing techniques or other breathing techniques.    Long  Term Goals  Exhibits compliance with exercise, home and travel O2 prescription;Verbalizes importance of monitoring SPO2 with pulse oximeter and return demonstration;Maintenance of O2 saturations>88%;Exhibits proper breathing techniques, such as pursed lip breathing or other method taught during program session;Compliance with respiratory medication;Demonstrates proper use of MDI's       Oxygen Re-Evaluation: Oxygen Re-Evaluation    Row Name 05/21/17 1019             Program Oxygen Prescription   Program Oxygen Prescription  Continuous       Liters per minute  4         Home Oxygen   Home Oxygen Device  Portable Concentrator;E-Tanks       Sleep Oxygen Prescription  Continuous       Liters per minute  3.5       Home Exercise Oxygen Prescription  Continuous       Liters per minute  3.5       Home at Rest Exercise Oxygen Prescription  Continuous       Liters per minute  4       Compliance with Home Oxygen Use  Yes         Goals/Expected Outcomes   Short Term Goals  To learn and demonstrate  proper use of respiratory medications;To learn and understand importance of maintaining oxygen saturations>88%;To learn and exhibit compliance with exercise, home and travel O2 prescription;To learn and understand importance of monitoring SPO2 with pulse oximeter and demonstrate accurate use of the pulse oximeter.;To learn and demonstrate proper pursed lip breathing techniques or other breathing techniques.       Long  Term Goals  Exhibits compliance with exercise, home and travel O2 prescription;Verbalizes importance of monitoring SPO2 with pulse oximeter and return demonstration;Maintenance of O2 saturations>88%;Exhibits proper breathing techniques, such as pursed lip breathing  or other method taught during program session;Compliance with respiratory medication;Demonstrates proper use of MDI's       Comments  Reviewed PLB technique with pt.  Talked about how it work and it's important to maintaining his exercise saturations.         Goals/Expected Outcomes  Short: Become more profiecient at using PLB.   Long: Become independent at using PLB.          Oxygen Discharge (Final Oxygen Re-Evaluation): Oxygen Re-Evaluation - 05/21/17 1019      Program Oxygen Prescription   Program Oxygen Prescription  Continuous    Liters per minute  4      Home Oxygen   Home Oxygen Device  Portable Concentrator;E-Tanks    Sleep Oxygen Prescription  Continuous    Liters per minute  3.5    Home Exercise Oxygen Prescription  Continuous    Liters per minute  3.5    Home at Rest Exercise Oxygen Prescription  Continuous    Liters per minute  4    Compliance with Home Oxygen Use  Yes      Goals/Expected Outcomes   Short Term Goals  To learn and demonstrate proper use of respiratory medications;To learn and understand importance of maintaining oxygen saturations>88%;To learn and exhibit compliance with exercise, home and travel O2 prescription;To learn and understand importance of monitoring SPO2 with pulse oximeter  and demonstrate accurate use of the pulse oximeter.;To learn and demonstrate proper pursed lip breathing techniques or other breathing techniques.    Long  Term Goals  Exhibits compliance with exercise, home and travel O2 prescription;Verbalizes importance of monitoring SPO2 with pulse oximeter and return demonstration;Maintenance of O2 saturations>88%;Exhibits proper breathing techniques, such as pursed lip breathing or other method taught during program session;Compliance with respiratory medication;Demonstrates proper use of MDI's    Comments  Reviewed PLB technique with pt.  Talked about how it work and it's important to maintaining his exercise saturations.      Goals/Expected Outcomes  Short: Become more profiecient at using PLB.   Long: Become independent at using PLB.       Initial Exercise Prescription: Initial Exercise Prescription - 05/14/17 1500      Date of Initial Exercise RX and Referring Provider   Date  05/14/17    Referring Provider  Ezequiel Kayser MD      Oxygen   Oxygen  Continuous    Liters  2      Treadmill   MPH  1.8    Grade  0    Minutes  15    METs  2.38      Recumbant Elliptical   Level  1    RPM  50    Minutes  15    METs  2      T5 Nustep   Level  1    SPM  80    Minutes  15    METs  2      Prescription Details   Frequency (times per week)  3    Duration  Progress to 45 minutes of aerobic exercise without signs/symptoms of physical distress      Intensity   THRR 40-80% of Max Heartrate  106-135    Ratings of Perceived Exertion  11-13    Perceived Dyspnea  0-4      Progression   Progression  Continue to progress workloads to maintain intensity without signs/symptoms of physical distress.      Horticulturist, commercial  Prescription  Yes    Weight  3 lbs    Reps  10-15       Perform Capillary Blood Glucose checks as needed.  Exercise Prescription Changes: Exercise Prescription Changes    Row Name 05/14/17 1500 05/31/17 1000            Response to Exercise   Blood Pressure (Admit)  132/74  122/78      Blood Pressure (Exercise)  146/74  -      Blood Pressure (Exit)  134/70  106/68      Heart Rate (Admit)  78 bpm  92 bpm      Heart Rate (Exercise)  116 bpm  105 bpm      Heart Rate (Exit)  83 bpm  94 bpm      Oxygen Saturation (Admit)  92 %  88 %      Oxygen Saturation (Exercise)  79 %  90 %      Oxygen Saturation (Exit)  93 %  97 %      Rating of Perceived Exertion (Exercise)  9  11      Perceived Dyspnea (Exercise)  2  1      Symptoms  fatigue  none      Comments  walk test results  -      Duration  -  Continue with 45 min of aerobic exercise without signs/symptoms of physical distress.      Intensity  -  THRR unchanged        Progression   Progression  -  Continue to progress workloads to maintain intensity without signs/symptoms of physical distress.      Average METs  -  2.2        Resistance Training   Training Prescription  -  Yes      Weight  -  4 lb      Reps  -  10-15        Oxygen   Oxygen  -  Continuous      Liters  -  4        Treadmill   MPH  -  1.6      Grade  -  0.5      Minutes  -  15      METs  -  2.34        Recumbant Elliptical   Level  -  2      RPM  -  50      Minutes  -  15      METs  -  2        T5 Nustep   Level  -  2      SPM  -  80      Minutes  -  15      METs  -  2         Exercise Comments: Exercise Comments    Row Name 05/21/17 1018           Exercise Comments  First full day of exercise!  Patient was oriented to gym and equipment including functions, settings, policies, and procedures.  Patient's individual exercise prescription and treatment plan were reviewed.  All starting workloads were established based on the results of the 6 minute walk test done at initial orientation visit.  The plan for exercise progression was also introduced and progression will be customized based on patient's performance and goals.  Exercise Goals and  Review: Exercise Goals    Row Name 05/14/17 1554             Exercise Goals   Increase Physical Activity  Yes       Intervention  Provide advice, education, support and counseling about physical activity/exercise needs.;Develop an individualized exercise prescription for aerobic and resistive training based on initial evaluation findings, risk stratification, comorbidities and participant's personal goals.       Expected Outcomes  Achievement of increased cardiorespiratory fitness and enhanced flexibility, muscular endurance and strength shown through measurements of functional capacity and personal statement of participant.       Increase Strength and Stamina  Yes       Intervention  Provide advice, education, support and counseling about physical activity/exercise needs.;Develop an individualized exercise prescription for aerobic and resistive training based on initial evaluation findings, risk stratification, comorbidities and participant's personal goals.       Expected Outcomes  Achievement of increased cardiorespiratory fitness and enhanced flexibility, muscular endurance and strength shown through measurements of functional capacity and personal statement of participant.       Able to understand and use rate of perceived exertion (RPE) scale  Yes       Intervention  Provide education and explanation on how to use RPE scale       Expected Outcomes  Short Term: Able to use RPE daily in rehab to express subjective intensity level;Long Term:  Able to use RPE to guide intensity level when exercising independently       Able to understand and use Dyspnea scale  Yes       Intervention  Provide education and explanation on how to use Dyspnea scale       Expected Outcomes  Short Term: Able to use Dyspnea scale daily in rehab to express subjective sense of shortness of breath during exertion;Long Term: Able to use Dyspnea scale to guide intensity level when exercising independently       Knowledge  and understanding of Target Heart Rate Range (THRR)  Yes       Intervention  Provide education and explanation of THRR including how the numbers were predicted and where they are located for reference       Expected Outcomes  Short Term: Able to state/look up THRR;Long Term: Able to use THRR to govern intensity when exercising independently;Short Term: Able to use daily as guideline for intensity in rehab       Able to check pulse independently  Yes       Intervention  Provide education and demonstration on how to check pulse in carotid and radial arteries.;Review the importance of being able to check your own pulse for safety during independent exercise       Expected Outcomes  Short Term: Able to explain why pulse checking is important during independent exercise;Long Term: Able to check pulse independently and accurately       Understanding of Exercise Prescription  Yes       Intervention  Provide education, explanation, and written materials on patient's individual exercise prescription       Expected Outcomes  Short Term: Able to explain program exercise prescription;Long Term: Able to explain home exercise prescription to exercise independently          Exercise Goals Re-Evaluation : Exercise Goals Re-Evaluation    Row Name 05/21/17 1018 05/31/17 1043           Exercise Goal Re-Evaluation   Exercise Goals  Review  Understanding of Exercise Prescription;Able to understand and use Dyspnea scale;Knowledge and understanding of Target Heart Rate Range (THRR);Able to understand and use rate of perceived exertion (RPE) scale  Increase Physical Activity;Increase Strength and Stamina;Able to understand and use rate of perceived exertion (RPE) scale;Able to understand and use Dyspnea scale      Comments  Reviewed RPE scale, THR and program prescription with pt today.  Pt voiced understanding and was given a copy of goals to take home.   [name] is tolerating exercise well.  Staff will continue to  monitor.      Expected Outcomes  Short: Use RPE daily to regulate intensity.  Long: Follow program prescription in THR.  Short - Nydia Bouton will attend LW three times per week.  Long - [name] will improve MET level and overall fitness         Discharge Exercise Prescription (Final Exercise Prescription Changes): Exercise Prescription Changes - 05/31/17 1000      Response to Exercise   Blood Pressure (Admit)  122/78    Blood Pressure (Exit)  106/68    Heart Rate (Admit)  92 bpm    Heart Rate (Exercise)  105 bpm    Heart Rate (Exit)  94 bpm    Oxygen Saturation (Admit)  88 %    Oxygen Saturation (Exercise)  90 %    Oxygen Saturation (Exit)  97 %    Rating of Perceived Exertion (Exercise)  11    Perceived Dyspnea (Exercise)  1    Symptoms  none    Duration  Continue with 45 min of aerobic exercise without signs/symptoms of physical distress.    Intensity  THRR unchanged      Progression   Progression  Continue to progress workloads to maintain intensity without signs/symptoms of physical distress.    Average METs  2.2      Resistance Training   Training Prescription  Yes    Weight  4 lb    Reps  10-15      Oxygen   Oxygen  Continuous    Liters  4      Treadmill   MPH  1.6    Grade  0.5    Minutes  15    METs  2.34      Recumbant Elliptical   Level  2    RPM  50    Minutes  15    METs  2      T5 Nustep   Level  2    SPM  80    Minutes  15    METs  2       Nutrition:  Target Goals: Understanding of nutrition guidelines, daily intake of sodium <1569m, cholesterol <2076m calories 30% from fat and 7% or less from saturated fats, daily to have 5 or more servings of fruits and vegetables.  Biometrics: Pre Biometrics - 05/14/17 1557      Pre Biometrics   Height  5' 9.3" (1.76 m)    Weight  258 lb 14.4 oz (117.4 kg)    Waist Circumference  48.75 inches    Hip Circumference  43.5 inches    Waist to Hip Ratio  1.12 %    BMI (Calculated)  37.91        Nutrition  Therapy Plan and Nutrition Goals: Nutrition Therapy & Goals - 05/25/17 1150      Nutrition Therapy   Diet  Diabetic/ TLC    Protein (specify units)  150g  Fiber  35 grams    Saturated Fats  16 max. grams    Fruits and Vegetables  6 servings/day    Sodium  2000 grams      Personal Nutrition Goals   Nutrition Goal  Pair sources of fiber and protein together with carbohydrates to help improve blood glucose control    Personal Goal #2  Practice portion control when snacking, particularly while watching TV    Personal Goal #3  Make pie and candy bars "sometimes" foods      Intervention Plan   Intervention  Prescribe, educate and counsel regarding individualized specific dietary modifications aiming towards targeted core components such as weight, hypertension, lipid management, diabetes, heart failure and other comorbidities.;Nutrition handout(s) given to patient.    Expected Outcomes  Short Term Goal: Understand basic principles of dietary content, such as calories, fat, sodium, cholesterol and nutrients.;Short Term Goal: A plan has been developed with personal nutrition goals set during dietitian appointment.;Long Term Goal: Adherence to prescribed nutrition plan.       Nutrition Assessments:   Nutrition Goals Re-Evaluation:   Nutrition Goals Discharge (Final Nutrition Goals Re-Evaluation):   Psychosocial: Target Goals: Acknowledge presence or absence of significant depression and/or stress, maximize coping skills, provide positive support system. Participant is able to verbalize types and ability to use techniques and skills needed for reducing stress and depression.   Initial Review & Psychosocial Screening: Initial Psych Review & Screening - 05/14/17 1422      Initial Review   Current issues with  Current Stress Concerns    Source of Stress Concerns  Chronic Illness    Comments  Patients COPD is his main factor      Family Dynamics   Good Support System?  Yes    Comments   His girlfriend is good for his support.      Barriers   Psychosocial barriers to participate in program  The patient should benefit from training in stress management and relaxation.      Screening Interventions   Interventions  Encouraged to exercise;Program counselor consult;To provide support and resources with identified psychosocial needs;Provide feedback about the scores to participant;Yes    Expected Outcomes  Long Term goal: The participant improves quality of Life and PHQ9 Scores as seen by post scores and/or verbalization of changes;Short Term goal: Identification and review with participant of any Quality of Life or Depression concerns found by scoring the questionnaire.;Long Term Goal: Stressors or current issues are controlled or eliminated.;Short Term goal: Utilizing psychosocial counselor, staff and physician to assist with identification of specific Stressors or current issues interfering with healing process. Setting desired goal for each stressor or current issue identified.       Quality of Life Scores:  Scores of 19 and below usually indicate a poorer quality of life in these areas.  A difference of  2-3 points is a clinically meaningful difference.  A difference of 2-3 points in the total score of the Quality of Life Index has been associated with significant improvement in overall quality of life, self-image, physical symptoms, and general health in studies assessing change in quality of life.  PHQ-9: Recent Review Flowsheet Data    Depression screen [facility] 2/9 05/14/2017 04/04/2016 02/15/2016   Decreased Interest 2 2 0   Down, Depressed, Hopeless 0 2 0   PHQ - 2 Score 2 4 0   Altered sleeping 3 1 -   Tired, decreased energy 3 3 -   Change in appetite 2 1 -  Feeling bad or failure about yourself  3 0 -   Trouble concentrating 2 1 -   Moving slowly or fidgety/restless 0 0 -   Suicidal thoughts 0 0 -   PHQ-9 Score 15 10 -   Difficult doing work/chores Somewhat difficult  Somewhat difficult -     Interpretation of Total Score  Total Score Depression Severity:  1-4 = Minimal depression, 5-9 = Mild depression, 10-14 = Moderate depression, 15-19 = Moderately severe depression, 20-27 = Severe depression   Psychosocial Evaluation and Intervention:   Psychosocial Re-Evaluation:   Psychosocial Discharge (Final Psychosocial Re-Evaluation):   Education: Education Goals: Education classes will be provided on a weekly basis, covering required topics. Participant will state understanding/return demonstration of topics presented.  Learning Barriers/Preferences: Learning Barriers/Preferences - 05/14/17 1441      Learning Barriers/Preferences   Learning Barriers  None    Learning Preferences  None       Education Topics:  Initial Evaluation Education: - Verbal, written and demonstration of respiratory meds, oximetry and breathing techniques. Instruction on use of nebulizers and MDIs and importance of monitoring MDI activations.   Pulmonary Rehab from 05/23/2017 in [facility] Cardiac and Pulmonary Rehab  Date  05/14/17  Educator  [facility]  Instruction Review Code  1- Verbalizes Understanding      General Nutrition Guidelines/Fats and Fiber: -Group instruction provided by verbal, written material, models and posters to present the general guidelines for heart healthy nutrition. Gives an explanation and review of dietary fats and fiber.   Controlling Sodium/Reading Food Labels: -Group verbal and written material supporting the discussion of sodium use in heart healthy nutrition. Review and explanation with models, verbal and written materials for utilization of the food label.   Exercise Physiology & General Exercise Guidelines: - Group verbal and written instruction with models to review the exercise physiology of the cardiovascular system and associated critical values. Provides general exercise guidelines with specific guidelines to those with heart or lung  disease.    Pulmonary Rehab from 05/03/2016 in [facility] Cardiac and Pulmonary Rehab  Date  05/03/16  Educator  [facility]  Instruction Review Code (retired)  2- meets goals/outcomes      Aerobic Exercise & Resistance Training: - Gives group verbal and written instruction on the various components of exercise. Focuses on aerobic and resistive training programs and the benefits of this training and how to safely progress through these programs.   Flexibility, Balance, Mind/Body Relaxation: Provides group verbal/written instruction on the benefits of flexibility and balance training, including mind/body exercise modes such as yoga, pilates and tai chi.  Demonstration and skill practice provided.   Pulmonary Rehab from 05/03/2016 in [facility] Cardiac and Pulmonary Rehab  Date  04/19/16  Educator  [facility]  Instruction Review Code (retired)  2- meets goals/outcomes      Stress and Anxiety: - Provides group verbal and written instruction about the health risks of elevated stress and causes of high stress.  Discuss the correlation between heart/lung disease and anxiety and treatment options. Review healthy ways to manage with stress and anxiety.   Depression: - Provides group verbal and written instruction on the correlation between heart/lung disease and depressed mood, treatment options, and the stigmas associated with seeking treatment.   Exercise & Equipment Safety: - Individual verbal instruction and demonstration of equipment use and safety with use of the equipment.   Pulmonary Rehab from 05/23/2017 in [facility] Cardiac and Pulmonary Rehab  Date  05/14/17  Educator  [facility]  Instruction Review Code  1- Verbalizes Understanding      Infection Prevention: - Provides verbal and written material to individual with discussion of infection control including proper hand washing and proper equipment cleaning during exercise session.   Pulmonary Rehab from 05/23/2017 in [facility] Cardiac and Pulmonary Rehab  Date  05/14/17   Educator  [facility]  Instruction Review Code  1- Verbalizes Understanding      Falls Prevention: - Provides verbal and written material to individual with discussion of falls prevention and safety.   Pulmonary Rehab from 05/23/2017 in [facility] Cardiac and Pulmonary Rehab  Date  05/14/17  Educator  [facility]  Instruction Review Code  1- Verbalizes Understanding      Diabetes: - Individual verbal and written instruction to review signs/symptoms of diabetes, desired ranges of glucose level fasting, after meals and with exercise. Advice that pre and post exercise glucose checks will be done for 3 sessions at entry of program.   Pulmonary Rehab from 05/03/2016 in [facility] Cardiac and Pulmonary Rehab  Date  04/04/16  Educator  [facility]  Instruction Review Code (retired)  2- meets goals/outcomes      Chronic Lung Diseases: - Group verbal and written instruction to review updates, respiratory medications, advancements in procedures and treatments. Discuss use of supplemental oxygen including available portable oxygen systems, continuous and intermittent flow rates, concentrators, personal use and safety guidelines. Review proper use of inhaler and spacers. Provide informative websites for self-education.    Energy Conservation: - Provide group verbal and written instruction for methods to conserve energy, plan and organize activities. Instruct on pacing techniques, use of adaptive equipment and posture/positioning to relieve shortness of breath.   Triggers and Exacerbations: - Group verbal and written instruction to review types of environmental triggers and ways to prevent exacerbations. Discuss weather changes, air quality and the benefits of nasal washing. Review warning signs and symptoms to help prevent infections. Discuss techniques for effective airway clearance, coughing, and vibrations.   Pulmonary Rehab from 05/03/2016 in [facility] Cardiac and Pulmonary Rehab  Date  04/26/16  Educator  [facility]  Instruction Review Code  (retired)  2- meets goals/outcomes      AED/CPR: - Group verbal and written instruction with the use of models to demonstrate the basic use of the AED with the basic ABC's of resuscitation.   Anatomy and Physiology of the Lungs: - Group verbal and written instruction with the use of models to provide basic lung anatomy and physiology related to function, structure and complications of lung disease.   Anatomy & Physiology of the Heart: - Group verbal and written instruction and models provide basic cardiac anatomy and physiology, with the coronary electrical and arterial systems. Review of Valvular disease and Heart Failure   Cardiac Medications: - Group verbal and written instruction to review commonly prescribed medications for heart disease. Reviews the medication, class of the drug, and side effects.   Know Your Numbers and Risk Factors: -Group verbal and written instruction about important numbers in your health.  Discussion of what are risk factors and how they play a role in the disease process.  Review of Cholesterol, Blood Pressure, Diabetes, and BMI and the role they play in your overall health.   Sleep Hygiene: -Provides group verbal and written instruction about how sleep can affect your health.  Define sleep hygiene, discuss sleep cycles and impact of sleep habits. Review good sleep hygiene tips.    Other: -Provides group and verbal instruction on various topics (see comments)   Pulmonary Rehab from 05/23/2017  in [facility] Cardiac and Pulmonary Rehab  Date  05/23/17  Educator  [facility]  Instruction Review Code  1- Verbalizes Understanding [SLEEP]       Knowledge Questionnaire Score: Knowledge Questionnaire Score - 05/14/17 1424      Knowledge Questionnaire Score   Pre Score  14/18 reviewed with patient        Core Components/Risk Factors/Patient Goals at Admission: Personal Goals and Risk Factors at Admission - 05/14/17 1435      Core Components/Risk Factors/Patient  Goals on Admission    Weight Management  Yes;Weight Loss    Intervention  Weight Management: Develop a combined nutrition and exercise program designed to reach desired caloric intake, while maintaining appropriate intake of nutrient and fiber, sodium and fats, and appropriate energy expenditure required for the weight goal.;Weight Management: Provide education and appropriate resources to help participant work on and attain dietary goals.    Admit Weight  258 lb 14.4 oz (117.4 kg)    Goal Weight: Short Term  253 lb (114.8 kg)    Goal Weight: Long Term  248 lb (112.5 kg)    Expected Outcomes  Short Term: Continue to assess and modify interventions until short term weight is achieved;Long Term: Adherence to nutrition and physical activity/exercise program aimed toward attainment of established weight goal;Understanding recommendations for meals to include 15-35% energy as protein, 25-35% energy from fat, 35-60% energy from carbohydrates, less than 210m of dietary cholesterol, 20-35 gm of total fiber daily;Understanding of distribution of calorie intake throughout the day with the consumption of 4-5 meals/snacks;Weight Loss: Understanding of general recommendations for a balanced deficit meal plan, which promotes 1-2 lb weight loss per week and includes a negative energy balance of [phone] kcal/d    Improve shortness of breath with ADL's  Yes    Intervention  Provide education, individualized exercise plan and daily activity instruction to help decrease symptoms of SOB with activities of daily living.    Expected Outcomes  Short Term: Achieves a reduction of symptoms when performing activities of daily living.    Diabetes  Yes    Intervention  Provide education about signs/symptoms and action to take for hypo/hyperglycemia.;Provide education about proper nutrition, including hydration, and aerobic/resistive exercise prescription along with prescribed medications to achieve blood glucose in normal ranges:  Fasting glucose 65-99 mg/dL    Expected Outcomes  Short Term: Participant verbalizes understanding of the signs/symptoms and immediate care of hyper/hypoglycemia, proper foot care and importance of medication, aerobic/resistive exercise and nutrition plan for blood glucose control.;Long Term: Attainment of HbA1C < 7%.    Heart Failure  Yes    Intervention  Provide a combined exercise and nutrition program that is supplemented with education, support and counseling about heart failure. Directed toward relieving symptoms such as shortness of breath, decreased exercise tolerance, and extremity edema.    Expected Outcomes  Improve functional capacity of life;Short term: Attendance in program 2-3 days a week with increased exercise capacity. Reported lower sodium intake. Reported increased fruit and vegetable intake. Reports medication compliance.;Short term: Daily weights obtained and reported for increase. Utilizing diuretic protocols set by physician.;Long term: Adoption of self-care skills and reduction of barriers for early signs and symptoms recognition and intervention leading to self-care maintenance.    Hypertension  -- states he is not on blood pressure medication       Core Components/Risk Factors/Patient Goals Review:    Core Components/Risk Factors/Patient Goals at Discharge (Final Review):    ITP Comments: ITP Comments  [place] Name 05/14/17 1357 05/28/17 0936 06/08/17 0916 06/11/17 0857     ITP Comments  Medical Evaluation completed. Chart sent for review and changes to Dr. Emily Filbert Director of [place]. Diagnosis can be found in [facility] encounter 05/14/16  [name] called to say that he is feeling sick and would not be in [place] this morning.  [name] called to say he went to the doctors on Monday. He had a high Co2. The doctor wants him to be on home oxygen at 2 liters continuous. He is going to advanced today to talk with them about home oxygen. He has been feeling tired and we informed him  to come to rehab and exercise to get his energy back. He states that his car is in the shop and he will not be in class today but states he will be in Class next week.  Neldon has been sick to obtain his 30 days notes. ITP 30 day review sent to Dr. Emily Filbert director of [place] for reviews and changes.       Comments: 30 day review

## 2017-06-13 DIAGNOSIS — J449 Chronic obstructive pulmonary disease, unspecified: Secondary | ICD-10-CM

## 2017-06-13 NOTE — Progress Notes (Signed)
Daily Session Note  Patient Details  Name: Mario [name] MRN: [id number] Date of Birth: 1945-09-14 Referring Provider:     Pulmonary Rehab from 05/14/2017 in [facility] Cardiac and Pulmonary Rehab  Referring Provider  Ezequiel Kayser MD      Encounter Date: 06/13/2017  Check In: Session Check In - 06/13/17 0948      Check-In   Location  [facility]-Cardiac & Pulmonary Rehab    Staff Present  Alberteen Sam, MA, RCEP, CCRP, Exercise Physiologist;Amanda Oletta Darter, BA, ACSM CEP, Exercise Physiologist;Auther Lyerly Flavia Shipper    Supervising physician immediately available to respond to emergencies  [name] immediately available ER MD    Physician(s)  Dr. Burlene Arnt and Alfred Levins    Medication changes reported      No    Fall or balance concerns reported     No    Warm-up and Cool-down  Performed as group-led instruction    Resistance Training Performed  Yes    VAD Patient?  No      Pain Assessment   Currently in Pain?  No/denies          Social History   Tobacco Use  Smoking Status Former Smoker  . Packs/day: 0.50  . Years: 1.00  . Pack years: 0.50  . Types: Cigarettes  . Last attempt to quit: 05/01/1969  . Years since quitting: 48.1  Smokeless Tobacco Never Used    Goals Met:  Independence with exercise equipment Exercise tolerated well No report of cardiac concerns or symptoms Strength training completed today  Goals Unmet:  Not Applicable  Comments: Pt able to follow exercise prescription today without complaint.  Will continue to monitor for progression.   Dr. Emily Filbert is Medical Director for [place] and [name].

## 2017-06-15 ENCOUNTER — Encounter: Payer: Medicare Other | Admitting: *Deleted

## 2017-06-15 DIAGNOSIS — J449 Chronic obstructive pulmonary disease, unspecified: Secondary | ICD-10-CM | POA: Diagnosis present

## 2017-06-15 DIAGNOSIS — I509 Heart failure, unspecified: Secondary | ICD-10-CM | POA: Diagnosis not present

## 2017-06-15 DIAGNOSIS — R0902 Hypoxemia: Secondary | ICD-10-CM | POA: Diagnosis not present

## 2017-06-15 NOTE — Progress Notes (Signed)
Daily Session Note  Patient Details  Name: Mario [name] MRN: [id number] Date of Birth: October 19, 1945 Referring Provider:     Pulmonary Rehab from 05/14/2017 in [facility] Cardiac and Pulmonary Rehab  Referring Provider  Ezequiel Kayser MD      Encounter Date: 06/15/2017  Check In: Session Check In - 06/15/17 1016      Check-In   Location  [facility]-Cardiac & Pulmonary Rehab    Staff Present  Nada Maclachlan, BA, ACSM CEP, Exercise Physiologist;Joseph Alcus Dad, RN BSN    Supervising physician immediately available to respond to emergencies  [name] immediately available ER MD    Physician(s)  Dr. Jacqualine Code and Quentin Cornwall    Medication changes reported      No    Fall or balance concerns reported     No    Warm-up and Cool-down  Performed as group-led instruction    Resistance Training Performed  Yes    VAD Patient?  No      Pain Assessment   Currently in Pain?  No/denies          Social History   Tobacco Use  Smoking Status Former Smoker  . Packs/day: 0.50  . Years: 1.00  . Pack years: 0.50  . Types: Cigarettes  . Last attempt to quit: 05/01/1969  . Years since quitting: 48.1  Smokeless Tobacco Never Used    Goals Met:  Proper associated with RPD/PD & O2 Sat Independence with exercise equipment Using PLB without cueing & demonstrates good technique Exercise tolerated well Strength training completed today  Goals Unmet:  Not Applicable  Comments: Pt able to follow exercise prescription today without complaint.  Will continue to monitor for progression.    Dr. Emily Filbert is Medical Director for [place] and [name]monary [facility].

## 2017-06-18 DIAGNOSIS — J449 Chronic obstructive pulmonary disease, unspecified: Secondary | ICD-10-CM | POA: Diagnosis not present

## 2017-06-18 NOTE — Progress Notes (Signed)
Daily Session Note  Patient Details  Name: Mario [name] MRN: [id number] Date of Birth: 1946/04/13 Referring Provider:     Pulmonary Rehab from 05/14/2017 in [facility] Cardiac and Pulmonary Rehab  Referring Provider  Ezequiel Kayser MD      Encounter Date: 06/18/2017  Check In: Session Check In - 06/18/17 1023      Check-In   Location  [facility]-Cardiac & Pulmonary Rehab    Staff Present  Nada Maclachlan, BA, ACSM CEP, Exercise Physiologist;Kelly Amedeo Plenty, BS, ACSM CEP, Exercise Physiologist;Alaster Asfaw Flavia Shipper    Supervising physician immediately available to respond to emergencies  [name] immediately available ER MD    Physician(s)  Jimmye Norman and Alfred Levins    Medication changes reported      No    Fall or balance concerns reported     No    Tobacco Cessation  No Change    Warm-up and Cool-down  Performed as group-led instruction    Resistance Training Performed  Yes    VAD Patient?  No      Pain Assessment   Currently in Pain?  No/denies          Social History   Tobacco Use  Smoking Status Former Smoker  . Packs/day: 0.50  . Years: 1.00  . Pack years: 0.50  . Types: Cigarettes  . Last attempt to quit: 05/01/1969  . Years since quitting: 48.1  Smokeless Tobacco Never Used    Goals Met:  Independence with exercise equipment Exercise tolerated well No report of cardiac concerns or symptoms Strength training completed today  Goals Unmet:  Not Applicable  Comments: Pt able to follow exercise prescription today without complaint.  Will continue to monitor for progression.   Dr. Emily Filbert is Medical Director for [place] and [name].

## 2017-06-20 ENCOUNTER — Telehealth: Payer: Self-pay

## 2017-06-20 NOTE — Telephone Encounter (Signed)
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

## 2017-06-22 DIAGNOSIS — J449 Chronic obstructive pulmonary disease, unspecified: Secondary | ICD-10-CM

## 2017-06-22 NOTE — Progress Notes (Signed)
Daily Session Note  Patient Details  Name: Mario [name] MRN: [id number] Date of Birth: 1945/12/22 Referring Provider:     Pulmonary Rehab from 05/14/2017 in [facility] Cardiac and Pulmonary Rehab  Referring Provider  Mario Kayser MD      Encounter Date: 06/22/2017  Check In: Session Check In - 06/22/17 1032      Check-In   Location  [facility]-Cardiac & Pulmonary Rehab    Staff Present  Mario [name], BS, PEC;Mario Blaine, RN BSN;Mario [name]    Supervising physician immediately available to respond to emergencies  [name] immediately available ER MD    Physician(s)  Dr. Jacqualine [name] and Mario [name]    Medication changes reported      No    Fall or balance concerns reported     No    Warm-up and Cool-down  Performed as group-led instruction    Resistance Training Performed  Yes    VAD Patient?  No      Pain Assessment   Currently in Pain?  No/denies          Social History   Tobacco Use  Smoking Status Former Smoker  . Packs/day: 0.50  . Years: 1.00  . Pack years: 0.50  . Types: Cigarettes  . Last attempt to quit: 05/01/1969  . Years since quitting: 48.1  Smokeless Tobacco Never Used    Goals Met:  Independence with exercise equipment Exercise tolerated well No report of cardiac concerns or symptoms Strength training completed today  Goals Unmet:  Not Applicable  Comments: Pt able to follow exercise prescription today without complaint.  Will continue to monitor for progression.   Dr. Emily [name] is Medical Director for [name] and [name].

## 2017-06-25 DIAGNOSIS — J449 Chronic obstructive pulmonary disease, unspecified: Secondary | ICD-10-CM

## 2017-06-25 NOTE — Progress Notes (Signed)
Daily Session Note  Patient Details  Name: Mario [name] MRN: [id number] Date of Birth: [birth year] Referring Provider:     Pulmonary Rehab from 05/14/2017 in [facility] Cardiac and Pulmonary Rehab  Referring Provider  Ezequiel Kayser MD      Encounter Date: 06/25/2017  Check In: Session Check In - 06/25/17 1117      Check-In   Location  [facility]-Cardiac & Pulmonary Rehab    Staff Present  Earlean Shawl, BS, ACSM CEP, Exercise Physiologist;Amanda Oletta Darter, BA, ACSM CEP, Exercise Physiologist;Telford Archambeau Flavia Shipper    Supervising physician immediately available to respond to emergencies  [name] immediately available ER MD    Physician(s)  Dr. Reita Cliche and Mariea Clonts    Medication changes reported      No    Fall or balance concerns reported     No    Warm-up and Cool-down  Performed as group-led instruction    Resistance Training Performed  Yes    VAD Patient?  No      Pain Assessment   Currently in Pain?  No/denies          Social History   Tobacco Use  Smoking Status Former Smoker  . Packs/day: 0.50  . Years: 1.00  . Pack years: 0.50  . Types: Cigarettes  . Last attempt to quit: 05/01/1969  . Years since quitting: 48.1  Smokeless Tobacco Never Used    Goals Met:  Independence with exercise equipment Exercise tolerated well No report of cardiac concerns or symptoms Strength training completed today  Goals Unmet:  Not Applicable  Comments: Pt able to follow exercise prescription today without complaint.  Will continue to monitor for progression.   Dr. Emily Filbert is Medical Director for [place] and [name].

## 2017-06-26 DIAGNOSIS — J449 Chronic obstructive pulmonary disease, unspecified: Secondary | ICD-10-CM

## 2017-06-27 DIAGNOSIS — J449 Chronic obstructive pulmonary disease, unspecified: Secondary | ICD-10-CM

## 2017-06-27 NOTE — Progress Notes (Signed)
Daily Session Note  Patient Details  Name: Mario [name] MRN: [id number] Date of Birth: 1945/08/05 Referring Provider:     Pulmonary Rehab from 05/14/2017 in [facility] Cardiac and Pulmonary Rehab  Referring Provider  Ezequiel Kayser MD      Encounter Date: 06/27/2017  Check In: Session Check In - 06/27/17 1119      Check-In   Location  [facility]-Cardiac & Pulmonary Rehab    Staff Present  Nada Maclachlan, BA, ACSM CEP, Exercise Physiologist;Merle Cirelli Darrin Nipper, [place], RCEP, CCRP, Exercise Physiologist    Supervising physician immediately available to respond to emergencies  [name] immediately available ER MD    Physician(s)  Dr. Reita Cliche and Jimmye Norman    Medication changes reported      No    Fall or balance concerns reported     No    Tobacco Cessation  No Change    Warm-up and Cool-down  Performed as group-led instruction    Resistance Training Performed  Yes    VAD Patient?  No      Pain Assessment   Currently in Pain?  No/denies          Social History   Tobacco Use  Smoking Status Former Smoker  . Packs/day: 0.50  . Years: 1.00  . Pack years: 0.50  . Types: Cigarettes  . Last attempt to quit: 05/01/1969  . Years since quitting: 48.1  Smokeless Tobacco Never Used    Goals Met:  Independence with exercise equipment Exercise tolerated well No report of cardiac concerns or symptoms Strength training completed today  Goals Unmet:  Not Applicable  Comments:Reviewed home exercise with pt today.  Pt plans to go to [facility] for exercise.  Reviewed THR, pulse, RPE, sign and symptoms, NTG use, and when to call 911 or MD.  Also discussed weather considerations and indoor options.  Pt voiced understanding.   Dr. Emily Filbert is Medical Director for [place] and [name].

## 2017-06-29 ENCOUNTER — Encounter: Payer: Medicare Other | Attending: Internal Medicine | Admitting: *Deleted

## 2017-06-29 DIAGNOSIS — I509 Heart failure, unspecified: Secondary | ICD-10-CM | POA: Diagnosis not present

## 2017-06-29 DIAGNOSIS — J449 Chronic obstructive pulmonary disease, unspecified: Secondary | ICD-10-CM | POA: Insufficient documentation

## 2017-06-29 DIAGNOSIS — R0902 Hypoxemia: Secondary | ICD-10-CM | POA: Insufficient documentation

## 2017-06-29 NOTE — Progress Notes (Signed)
Daily Session Note  Patient Details  Name: Mario [name] MRN: [id number] Date of Birth: 06/08/1945 Referring Provider:     Pulmonary Rehab from 05/14/2017 in [facility] Cardiac and Pulmonary Rehab  Referring Provider  Ezequiel Kayser MD      Encounter Date: 06/29/2017  Check In: Session Check In - 06/29/17 1012      Check-In   Staff Present  Renita Papa, RN BSN;Amanda Oletta Darter, BA, ACSM CEP, Exercise Physiologist;Krista Frederico Hamman, RN BSN    Supervising physician immediately available to respond to emergencies  [name] immediately available ER MD    Physician(s)  Dr. Virgie Dad and Clearnce Hasten    Medication changes reported      No    Fall or balance concerns reported     No    Warm-up and Cool-down  Performed as group-led instruction    Resistance Training Performed  Yes    VAD Patient?  No      Pain Assessment   Currently in Pain?  No/denies          Social History   Tobacco Use  Smoking Status Former Smoker  . Packs/day: 0.50  . Years: 1.00  . Pack years: 0.50  . Types: Cigarettes  . Last attempt to quit: 05/01/1969  . Years since quitting: 48.1  Smokeless Tobacco Never Used    Goals Met:  Proper associated with RPD/PD & O2 Sat Independence with exercise equipment Exercise tolerated well Strength training completed today  Goals Unmet:  Not Applicable  Comments: Pt able to follow exercise prescription today without complaint.  Will continue to monitor for progression.    Dr. Emily Filbert is Medical Director for [place] and [name].

## 2017-07-02 ENCOUNTER — Encounter: Payer: Medicare Other | Admitting: *Deleted

## 2017-07-02 DIAGNOSIS — J449 Chronic obstructive pulmonary disease, unspecified: Secondary | ICD-10-CM

## 2017-07-02 NOTE — Progress Notes (Signed)
Daily Session Note  Patient Details  Name: Mario [name] MRN: [id number] Date of Birth: January 21, 1946 Referring Provider:     Pulmonary Rehab from 05/14/2017 in [facility] Cardiac and Pulmonary Rehab  Referring Provider  Ezequiel Kayser MD      Encounter Date: 07/02/2017  Check In: Session Check In - 07/02/17 1013      Check-In   Location  [facility]-Cardiac & Pulmonary Rehab    Staff Present  Earlean Shawl, BS, ACSM CEP, Exercise Physiologist;Amanda Oletta Darter, BA, ACSM CEP, Exercise Physiologist;Joseph Flavia Shipper    Supervising physician immediately available to respond to emergencies  [name] immediately available ER MD    Physician(s)  Burlene Arnt and Jimmye Norman     Medication changes reported      No    Fall or balance concerns reported     No    Warm-up and Cool-down  Performed as group-led instruction    Resistance Training Performed  Yes    VAD Patient?  No      Pain Assessment   Currently in Pain?  No/denies    Multiple Pain Sites  No          Social History   Tobacco Use  Smoking Status Former Smoker  . Packs/day: 0.50  . Years: 1.00  . Pack years: 0.50  . Types: Cigarettes  . Last attempt to quit: 05/01/1969  . Years since quitting: 48.2  Smokeless Tobacco Never Used    Goals Met:  Proper associated with RPD/PD & O2 Sat Independence with exercise equipment Exercise tolerated well Personal goals reviewed Strength training completed today  Goals Unmet:  Not Applicable  Comments: Pt able to follow exercise prescription today without complaint.  Will continue to monitor for progression.    Dr. Emily Filbert is Medical Director for [place] and [name].

## 2017-07-04 DIAGNOSIS — J449 Chronic obstructive pulmonary disease, unspecified: Secondary | ICD-10-CM

## 2017-07-04 NOTE — Progress Notes (Signed)
Daily Session Note  Patient Details  Name: Mario [name] MRN: [id number] Date of Birth: 1945/09/08 Referring Provider:     Pulmonary Rehab from 05/14/2017 in [facility] and Pulmonary Rehab  Referring Provider  Ezequiel Kayser MD      Encounter Date: 07/04/2017  Check In: Session Check In - 07/04/17 1024      Check-In   Location  [facility]Card[facility] Pulmonary Re[facility]    Staff Present  Nada Maclachlan, BA, ACSM CEP, Exercise Physiologist;Shaunie Boehm Darrin Nipper, [place], RCEP, CCRP, Exercise Physiologist    Supervising physician immediately available to respond to emergencies  [name] immediately available ER MD    Physician(s)  Dr. Joni Fears and Jimmye Norman    Medication changes reported      No    Fall or balance concerns reported     No    Warm-up and Cool-down  Performed as group-led instruction    Resistance Training Performed  Yes    VAD Patient?  No      Pain Assessment   Currently in Pain?  No/denies          Social History   Tobacco Use  Smoking Status Former Smoker  . Packs/day: 0.50  . Years: 1.00  . Pack years: 0.50  . Types: Cigarettes  . Last attempt to quit: 05/01/1969  . Years since quitting: 48.2  Smokeless Tobacco Never Used    Goals Met:  Independence with exercise equipment Exercise tolerated well No report of cardiac concerns or symptoms Strength training completed today  Goals Unmet:  Not Applicable  Comments: Pt able to follow exercise prescription today without complaint.  Will continue to monitor for progression.   Dr. Emily Filbert is Medical Director for [place] and [name].

## 2017-07-06 ENCOUNTER — Encounter: Payer: Medicare Other | Admitting: *Deleted

## 2017-07-06 DIAGNOSIS — J449 Chronic obstructive pulmonary disease, unspecified: Secondary | ICD-10-CM

## 2017-07-06 NOTE — Progress Notes (Signed)
Daily Session Note  Patient Details  Name: Mario [name] MRN: [id number] Date of Birth: 03-18-1946 Referring Provider:     Pulmonary Rehab from 05/14/2017 in [facility] Cardiac and Pulmonary Rehab  Referring Provider  Ezequiel Kayser MD      Encounter Date: 07/06/2017  Check In: Session Check In - 07/06/17 1012      Check-In   Location  [facility]-Cardiac & Pulmonary Rehab    Staff Present  Renita Papa, RN Vickki Hearing, BA, ACSM CEP, Exercise Physiologist;Mary Kellie Shropshire, RN, BSN, MA    Supervising physician immediately available to respond to emergencies  [name] immediately available ER MD    Physician(s)  Dr. Reita Cliche and Archie Balboa    Medication changes reported      No    Fall or balance concerns reported     No    Warm-up and Cool-down  Performed as group-led instruction    Resistance Training Performed  Yes    VAD Patient?  No      Pain Assessment   Currently in Pain?  No/denies          Social History   Tobacco Use  Smoking Status Former Smoker  . Packs/day: 0.50  . Years: 1.00  . Pack years: 0.50  . Types: Cigarettes  . Last attempt to quit: 05/01/1969  . Years since quitting: 48.2  Smokeless Tobacco Never Used    Goals Met:  Proper associated with RPD/PD & O2 Sat Independence with exercise equipment Using PLB without cueing & demonstrates good technique Exercise tolerated well Strength training completed today  Goals Unmet:  Not Applicable  Comments: Pt able to follow exercise prescription today without complaint.  Will continue to monitor for progression.    Dr. Emily Filbert is Medical Director for [place] and [name].

## 2017-07-09 DIAGNOSIS — J449 Chronic obstructive pulmonary disease, unspecified: Secondary | ICD-10-CM

## 2017-07-09 NOTE — Progress Notes (Signed)
Daily Session Note  Patient Details  Name: Mario [name] MRN: [id number] Date of Birth: [birth year] Referring Provider:     Pulmonary Rehab from 05/14/2017 in [facility] Cardiac and Pulmonary Rehab  Referring Provider  Ezequiel Kayser MD      Encounter Date: 07/09/2017  Check In: Session Check In - 07/09/17 1012      Check-In   Location  [facility]Cardiac & Pulmonary Rehab    Staff Present  Nada Maclachlan, BA, ACSM CEP, Exercise Physiologist;Kelly Amedeo Plenty, BS, ACSM CEP, Exercise Physiologist;Verlie Hellenbrand Flavia Shipper    Supervising physician immediately available to respond to emergencies  [name] immediately available ER MD    Physician(s)  Dr. Corky Downs and Jimmye Norman    Medication changes reported      No    Fall or balance concerns reported     No    Tobacco Cessation  No Change    Warm-up and Cool-down  Performed as group-led instruction    Resistance Training Performed  Yes    VAD Patient?  No      Pain Assessment   Currently in Pain?  No/denies          Social History   Tobacco Use  Smoking Status Former Smoker  . Packs/day: 0.50  . Years: 1.00  . Pack years: 0.50  . Types: Cigarettes  . Last attempt to quit: 05/01/1969  . Years since quitting: 48.2  Smokeless Tobacco Never Used    Goals Met:  Independence with exercise equipment Exercise tolerated well No report of cardiac concerns or symptoms Strength training completed today  Goals Unmet:  Not Applicable  Comments: Pt able to follow exercise prescription today without complaint.  Will continue to monitor for progression.   Dr. Emily Filbert is Medical Director for [place] and [name].

## 2017-07-09 NOTE — Progress Notes (Signed)
Pulmonary Individual Treatment Plan  Patient Details  Name: Mario [name] MRN: [id number] Date of Birth: 04-25-1946 Referring Provider:     Pulmonary Rehab from 05/14/2017 in [facility] Cardiac and Pulmonary Rehab  Referring Provider  Ezequiel Kayser MD      Initial Encounter Date:    Pulmonary Rehab from 05/14/2017 in [facility] Cardiac and Pulmonary Rehab  Date  05/14/17  Referring Provider  Ezequiel Kayser MD      Visit Diagnosis: Chronic obstructive pulmonary disease, unspecified COPD type ([place])  Patient's Home Medications on Admission:  Current Outpatient Medications:  .  albuterol (PROVENTIL HFA;VENTOLIN HFA) 108 (90 Base) MCG/ACT inhaler, Inhale 2 puffs into the lungs as needed., Disp: , Rfl:  .  amLODipine (NORVASC) 5 MG tablet, Take 1 tablet by mouth daily., Disp: , Rfl:  .  diclofenac sodium (VOLTAREN) 1 % GEL, Apply 1 application topically 4 (four) times daily as needed., Disp: , Rfl:  .  edoxaban (SAVAYSA) 60 MG TABS tablet, Take 60 mg by mouth daily. cardiology, Disp: , Rfl:  .  fluticasone-salmeterol (ADVAIR HFA) 230-21 MCG/ACT inhaler, Inhale 2 puffs into the lungs 2 (two) times daily., Disp: , Rfl:  .  furosemide (LASIX) 80 MG tablet, Take 1 tablet by mouth daily., Disp: , Rfl:  .  gabapentin (NEURONTIN) 300 MG capsule, Take 1 capsule by mouth 3 (three) times daily., Disp: , Rfl:  .  glucose blood (ONE TOUCH ULTRA TEST) test strip, Use as instructed FOR TESTING three times daily.  E11.9, Disp: , Rfl:  .  HYDROcodone-acetaminophen (NORCO/VICODIN) 5-325 MG tablet, Take 1 tablet by mouth every 4 (four) hours as needed for moderate pain., Disp: 20 tablet, Rfl: 0 .  ipratropium-albuterol (DUONEB) 0.5-2.5 (3) MG/3ML SOLN, Inhale into the lungs., Disp: , Rfl:  .  losartan (COZAAR) 50 MG tablet, Take 1 tablet by mouth daily., Disp: , Rfl:  .  metFORMIN (GLUCOPHAGE) 1000 MG tablet, Take 1 tablet by mouth 2 (two) times daily., Disp: , Rfl:  .  sotalol (BETAPACE) 80 MG tablet, Take 1 tablet by  mouth 2 (two) times daily., Disp: , Rfl:  .  terazosin (HYTRIN) 5 MG capsule, Take 1 capsule by mouth every other day., Disp: , Rfl:  .  umeclidinium bromide (INCRUSE ELLIPTA) 62.5 MCG/INH AEPB, Inhale 1 puff into the lungs daily., Disp: , Rfl:   Past Medical History: Past Medical History:  Diagnosis Date  . COPD (chronic obstructive pulmonary disease) ([place])   . Diabetes mellitus without complication ([place])   . Diabetic neuropathy ([place])   . Hypertension     Tobacco Use: Social History   Tobacco Use  Smoking Status Former Smoker  . Packs/day: 0.50  . Years: 1.00  . Pack years: 0.50  . Types: Cigarettes  . Last attempt to quit: 05/01/1969  . Years since quitting: 48.2  Smokeless Tobacco Never Used    Labs: Recent Chemical engineer    Labs for ITP Cardiac and Pulmonary Rehab Latest Ref Rng & Units 02/15/2016   Hemoglobin A1c 4.8 - 5.6 % 6.8(H)       Pulmonary Assessment Scores: Pulmonary Assessment Scores    Row Name 05/14/17 1425 06/26/17 1505       ADL UCSD   ADL Phase  Entry  Entry    SOB Score total  51  -    Rest  1  -    Walk  3  -    Stairs  3  -    Bath  1  -  Dress  2  -    Shop  3  -      CAT Score   CAT Score  18  -      mMRC Score   mMRC Score  -  2       Pulmonary Function Assessment: Pulmonary Function Assessment - 05/14/17 1438      Initial Spirometry Results   FVC%  36 %    FEV1%  40 %    FEV1/FVC Ratio  76.26    Comments  best of two, good patient effort      Post Bronchodilator Spirometry Results   FVC%  47.16 %    FEV1%  46.8 %    FEV1/FVC Ratio  74.69    Comments  best of two, good patient effort      Breath   Bilateral Breath Sounds  Decreased;Clear    Shortness of Breath  Yes;Limiting activity       Exercise Target Goals:    Exercise Program Goal: Individual exercise prescription set using results from initial 6 min walk test and THRR while considering  patient's activity barriers and safety.    Exercise  Prescription Goal: Initial exercise prescription builds to 30-45 minutes a day of aerobic activity, 2-3 days per week.  Home exercise guidelines will be given to patient during program as part of exercise prescription that the participant will acknowledge.  Activity Barriers & Risk Stratification: Activity Barriers & Cardiac Risk Stratification - 05/14/17 1550      Activity Barriers & Cardiac Risk Stratification   Activity Barriers  Arthritis;Joint Problems;Deconditioning;Muscular Weakness;Shortness of Breath;Balance Concerns bilateral knee pain, worse on left side       6 Minute Walk: 6 Minute Walk    Row Name 05/14/17 1546         6 Minute Walk   Phase  Initial     Distance  630 feet     Walk Time  3.8 minutes     # of Rest Breaks  1 test terminated at 3:48     MPH  1.88     METS  1.35     RPE  9     Perceived Dyspnea   2     VO2 Peak  4.74     Symptoms  Yes (comment)     Comments  fatigue     Resting HR  78 bpm     Resting BP  132/74     Resting Oxygen Saturation   92 %     Exercise Oxygen Saturation  during 6 min walk  79 %     Max Ex. HR  116 bpm     Max Ex. BP  146/74     2 Minute Post BP  134/70       Interval HR   2 Minute HR  116     3 Minute HR  106     4 Minute HR  112 test terminated at 3:48     2 Minute Post HR  80     Interval Heart Rate?  Yes       Interval Oxygen   Interval Oxygen?  Yes     Baseline Oxygen Saturation %  92 %     1 Minute Oxygen Saturation %  84 %     1 Minute Liters of Oxygen  0 L Room Air     2 Minute Oxygen Saturation %  82 %     2 Minute  Liters of Oxygen  0 L     3 Minute Oxygen Saturation %  81 % 80% 2:44     3 Minute Liters of Oxygen  0 L     4 Minute Oxygen Saturation %  79 % test terminated 3:48 and 78% 86mn     4 Minute Liters of Oxygen  0 L     5 Minute Oxygen Saturation %  85 %     5 Minute Liters of Oxygen  0 L     2 Minute Post Oxygen Saturation %  93 %     2 Minute Post Liters of Oxygen  83 L       Oxygen  Initial Assessment: Oxygen Initial Assessment - 05/14/17 1427      Home Oxygen   Home Oxygen Device  Portable Concentrator;E-Tanks    Sleep Oxygen Prescription  Continuous    Liters per minute  3.5    Home Exercise Oxygen Prescription  None    Liters per minute  3.5    Home at Rest Exercise Oxygen Prescription  None    Liters per minute  --    Compliance with Home Oxygen Use  Yes      Initial 6 min Walk   Oxygen Used  None      Program Oxygen Prescription   Program Oxygen Prescription  None      Intervention   Short Term Goals  To learn and demonstrate proper use of respiratory medications;To learn and understand importance of maintaining oxygen saturations>88%;To learn and exhibit compliance with exercise, home and travel O2 prescription;To learn and understand importance of monitoring SPO2 with pulse oximeter and demonstrate accurate use of the pulse oximeter.;To learn and demonstrate proper pursed lip breathing techniques or other breathing techniques.    Long  Term Goals  Exhibits compliance with exercise, home and travel O2 prescription;Verbalizes importance of monitoring SPO2 with pulse oximeter and return demonstration;Maintenance of O2 saturations>88%;Exhibits proper breathing techniques, such as pursed lip breathing or other method taught during program session;Compliance with respiratory medication;Demonstrates proper use of MDI's       Oxygen Re-Evaluation: Oxygen Re-Evaluation    Row Name 05/21/17 1019 07/02/17 1042           Program Oxygen Prescription   Program Oxygen Prescription  Continuous  Continuous;E-Tanks      Liters per minute  4  2        Home Oxygen   Home Oxygen Device  Portable Concentrator;E-Tanks  Portable Concentrator;E-Tanks      Sleep Oxygen Prescription  Continuous  Continuous      Liters per minute  3.5  2      Home Exercise Oxygen Prescription  Continuous  Continuous      Liters per minute  3.5  4      Home at Rest Exercise Oxygen  Prescription  Continuous  Continuous      Liters per minute  4  4      Compliance with Home Oxygen Use  Yes  Yes        Goals/Expected Outcomes   Short Term Goals  To learn and demonstrate proper use of respiratory medications;To learn and understand importance of maintaining oxygen saturations>88%;To learn and exhibit compliance with exercise, home and travel O2 prescription;To learn and understand importance of monitoring SPO2 with pulse oximeter and demonstrate accurate use of the pulse oximeter.;To learn and demonstrate proper pursed lip breathing techniques or other breathing techniques.  To learn and demonstrate proper  use of respiratory medications;To learn and understand importance of maintaining oxygen saturations>88%;To learn and exhibit compliance with exercise, home and travel O2 prescription;To learn and understand importance of monitoring SPO2 with pulse oximeter and demonstrate accurate use of the pulse oximeter.;To learn and demonstrate proper pursed lip breathing techniques or other breathing techniques.      Long  Term Goals  Exhibits compliance with exercise, home and travel O2 prescription;Verbalizes importance of monitoring SPO2 with pulse oximeter and return demonstration;Maintenance of O2 saturations>88%;Exhibits proper breathing techniques, such as pursed lip breathing or other method taught during program session;Compliance with respiratory medication;Demonstrates proper use of MDI's  Exhibits compliance with exercise, home and travel O2 prescription;Verbalizes importance of monitoring SPO2 with pulse oximeter and return demonstration;Maintenance of O2 saturations>88%;Exhibits proper breathing techniques, such as pursed lip breathing or other method taught during program session;Compliance with respiratory medication;Demonstrates proper use of MDI's      Comments  Reviewed PLB technique with pt.  Talked about how it work and it's important to maintaining his exercise saturations.     Mario [name] has been complaint with his oxygen use.  He uses his pulse oximeters regularly as he has two.  He does not use his PLB all the time, but he will use the PLB when he gets SOB.  He has been doing well with his inhaler and spacer.  He is good at maintaining his saturations.       Goals/Expected Outcomes  Short: Become more profiecient at using PLB.   Long: Become independent at using PLB.  Short: Continue to use PLB more frequently during any activity.  Long: Continued compliance with oxygen therapy.          Oxygen Discharge (Final Oxygen Re-Evaluation): Oxygen Re-Evaluation - 07/02/17 1042      Program Oxygen Prescription   Program Oxygen Prescription  Continuous;E-Tanks    Liters per minute  2      Home Oxygen   Home Oxygen Device  Portable Concentrator;E-Tanks    Sleep Oxygen Prescription  Continuous    Liters per minute  2    Home Exercise Oxygen Prescription  Continuous    Liters per minute  4    Home at Rest Exercise Oxygen Prescription  Continuous    Liters per minute  4    Compliance with Home Oxygen Use  Yes      Goals/Expected Outcomes   Short Term Goals  To learn and demonstrate proper use of respiratory medications;To learn and understand importance of maintaining oxygen saturations>88%;To learn and exhibit compliance with exercise, home and travel O2 prescription;To learn and understand importance of monitoring SPO2 with pulse oximeter and demonstrate accurate use of the pulse oximeter.;To learn and demonstrate proper pursed lip breathing techniques or other breathing techniques.    Long  Term Goals  Exhibits compliance with exercise, home and travel O2 prescription;Verbalizes importance of monitoring SPO2 with pulse oximeter and return demonstration;Maintenance of O2 saturations>88%;Exhibits proper breathing techniques, such as pursed lip breathing or other method taught during program session;Compliance with respiratory medication;Demonstrates proper use of MDI's     Comments  Mario [name] has been complaint with his oxygen use.  He uses his pulse oximeters regularly as he has two.  He does not use his PLB all the time, but he will use the PLB when he gets SOB.  He has been doing well with his inhaler and spacer.  He is good at maintaining his saturations.     Goals/Expected Outcomes  Short: Continue to use PLB  more frequently during any activity.  Long: Continued compliance with oxygen therapy.        Initial Exercise Prescription: Initial Exercise Prescription - 05/14/17 1500      Date of Initial Exercise RX and Referring Provider   Date  05/14/17    Referring Provider  Ezequiel Kayser MD      Oxygen   Oxygen  Continuous    Liters  2      Treadmill   MPH  1.8    Grade  0    Minutes  15    METs  2.38      Recumbant Elliptical   Level  1    RPM  50    Minutes  15    METs  2      T5 Nustep   Level  1    SPM  80    Minutes  15    METs  2      Prescription Details   Frequency (times per week)  3    Duration  Progress to 45 minutes of aerobic exercise without signs/symptoms of physical distress      Intensity   THRR 40-80% of Max Heartrate  106-135    Ratings of Perceived Exertion  11-13    Perceived Dyspnea  0-4      Progression   Progression  Continue to progress workloads to maintain intensity without signs/symptoms of physical distress.      Resistance Training   Training Prescription  Yes    Weight  3 lbs    Reps  10-15       Perform Capillary Blood Glucose checks as needed.  Exercise Prescription Changes: Exercise Prescription Changes    Row Name 05/14/17 1500 05/31/17 1000 06/27/17 1500         Response to Exercise   Blood Pressure (Admit)  132/74  122/78  102/76     Blood Pressure (Exercise)  146/74  -  -     Blood Pressure (Exit)  134/70  106/68  132/70     Heart Rate (Admit)  78 bpm  92 bpm  88 bpm     Heart Rate (Exercise)  116 bpm  105 bpm  100 bpm     Heart Rate (Exit)  83 bpm  94 bpm  79 bpm     Oxygen Saturation  (Admit)  92 %  88 %  88 %     Oxygen Saturation (Exercise)  79 %  90 %  90 %     Oxygen Saturation (Exit)  93 %  97 %  97 %     Rating of Perceived Exertion (Exercise)  '9  11  12     ' Perceived Dyspnea (Exercise)  2  1  0     Symptoms  fatigue  none  none     Comments  walk test results  -  -     Duration  -  Continue with 45 min of aerobic exercise without signs/symptoms of physical distress.  Continue with 45 min of aerobic exercise without signs/symptoms of physical distress.     Intensity  -  THRR unchanged  THRR unchanged       Progression   Progression  -  Continue to progress workloads to maintain intensity without signs/symptoms of physical distress.  Continue to progress workloads to maintain intensity without signs/symptoms of physical distress.     Average METs  -  2.2  1.8  Resistance Training   Training Prescription  -  Yes  Yes     Weight  -  4 lb  6 lb     Reps  -  10-15  10-15       Oxygen   Oxygen  -  Continuous  Continuous     Liters  -  4  4       Treadmill   MPH  -  1.6  -     Grade  -  0.5  -     Minutes  -  15  -     METs  -  2.34  -       Recumbant Elliptical   Level  -  2  2     RPM  -  50  50     Minutes  -  15  15     METs  -  2  1.7       T5 Nustep   Level  -  2  5     SPM  -  80  80     Minutes  -  15  15     METs  -  2  1.9       Home Exercise Plan   Plans to continue exercise at  -  -  [employer] (comment) [employer]     Frequency  -  -  Add 2 additional days to program exercise sessions.     Initial Home Exercises Provided  -  -  06/27/17        Exercise Comments: Exercise Comments    Row Name 05/21/17 1018           Exercise Comments  First full day of exercise!  Patient was oriented to gym and equipment including functions, settings, policies, and procedures.  Patient's individual exercise prescription and treatment plan were reviewed.  All starting workloads were established based on the results of the 6 minute  walk test done at initial orientation visit.  The plan for exercise progression was also introduced and progression will be customized based on patient's performance and goals.          Exercise Goals and Review: Exercise Goals    Row Name 05/14/17 1554             Exercise Goals   Increase Physical Activity  Yes       Intervention  Provide advice, education, support and counseling about physical activity/exercise needs.;Develop an individualized exercise prescription for aerobic and resistive training based on initial evaluation findings, risk stratification, comorbidities and participant's personal goals.       Expected Outcomes  Achievement of increased cardiorespiratory fitness and enhanced flexibility, muscular endurance and strength shown through measurements of functional capacity and personal statement of participant.       Increase Strength and Stamina  Yes       Intervention  Provide advice, education, support and counseling about physical activity/exercise needs.;Develop an individualized exercise prescription for aerobic and resistive training based on initial evaluation findings, risk stratification, comorbidities and participant's personal goals.       Expected Outcomes  Achievement of increased cardiorespiratory fitness and enhanced flexibility, muscular endurance and strength shown through measurements of functional capacity and personal statement of participant.       Able to understand and use rate of perceived exertion (RPE) scale  Yes       Intervention  Provide education and explanation  on how to use RPE scale       Expected Outcomes  Short Term: Able to use RPE daily in rehab to express subjective intensity level;Long Term:  Able to use RPE to guide intensity level when exercising independently       Able to understand and use Dyspnea scale  Yes       Intervention  Provide education and explanation on how to use Dyspnea scale       Expected Outcomes  Short Term: Able to  use Dyspnea scale daily in rehab to express subjective sense of shortness of breath during exertion;Long Term: Able to use Dyspnea scale to guide intensity level when exercising independently       Knowledge and understanding of Target Heart Rate Range (THRR)  Yes       Intervention  Provide education and explanation of THRR including how the numbers were predicted and where they are located for reference       Expected Outcomes  Short Term: Able to state/look up THRR;Long Term: Able to use THRR to govern intensity when exercising independently;Short Term: Able to use daily as guideline for intensity in rehab       Able to check pulse independently  Yes       Intervention  Provide education and demonstration on how to check pulse in carotid and radial arteries.;Review the importance of being able to check your own pulse for safety during independent exercise       Expected Outcomes  Short Term: Able to explain why pulse checking is important during independent exercise;Long Term: Able to check pulse independently and accurately       Understanding of Exercise Prescription  Yes       Intervention  Provide education, explanation, and written materials on patient's individual exercise prescription       Expected Outcomes  Short Term: Able to explain program exercise prescription;Long Term: Able to explain home exercise prescription to exercise independently          Exercise Goals Re-Evaluation : Exercise Goals Re-Evaluation    Row Name 05/21/17 1018 05/31/17 1043 06/27/17 1517 06/27/17 1518 07/02/17 1024     Exercise Goal Re-Evaluation   Exercise Goals Review  Understanding of Exercise Prescription;Able to understand and use Dyspnea scale;Knowledge and understanding of Target Heart Rate Range (THRR);Able to understand and use rate of perceived exertion (RPE) scale  Increase Physical Activity;Increase Strength and Stamina;Able to understand and use rate of perceived exertion (RPE) scale;Able to  understand and use Dyspnea scale  Increase Physical Activity;Able to understand and use rate of perceived exertion (RPE) scale;Knowledge and understanding of Target Heart Rate Range (THRR);Increase Strength and Stamina;Able to understand and use Dyspnea scale  Increase Physical Activity;Able to understand and use rate of perceived exertion (RPE) scale;Knowledge and understanding of Target Heart Rate Range (THRR);Increase Strength and Stamina;Able to understand and use Dyspnea scale  Increase Physical Activity;Understanding of Exercise Prescription;Increase Strength and Stamina   Comments  Reviewed RPE scale, THR and program prescription with pt today.  Pt voiced understanding and was given a copy of goals to take home.   Mario [name] is tolerating exercise well.  Staff will continue to monitor.  -  Reviewed home ex with PT. Plans to go to [facility] on T/Th whe not at [facility].  Safety and HR/RPE reviewed.  Mario [name] has been doing well in rehab.  His knee is one of his biggest limitations, he finds that he needs longer to warm up.  He  has started to exercise some at home.  He is has been doing weights and walking.  He walked 7 min yesterday.   He is going to work on building up his time to 30 min a day.   Expected Outcomes  Short: Use RPE daily to regulate intensity.  Long: Follow program prescription in THR.  Short - Mario [name] will attend LW three times per week.  Long - Mario [name] will improve MET level and overall fitness  -  Short - Pt will add 2 days to program session Long - Pt will maintain exercise on his own  Short: Add 1 min each day he goes to work up to 49mn for exercise.  Long: Continue to work on bOffice manager       Discharge Exercise Prescription (Final Exercise Prescription Changes): Exercise Prescription Changes - 06/27/17 1500      Response to Exercise   Blood Pressure (Admit)  102/76    Blood Pressure (Exit)  132/70    Heart Rate (Admit)  88 bpm    Heart Rate (Exercise)  100 bpm    Heart  Rate (Exit)  79 bpm    Oxygen Saturation (Admit)  88 %    Oxygen Saturation (Exercise)  90 %    Oxygen Saturation (Exit)  97 %    Rating of Perceived Exertion (Exercise)  12    Perceived Dyspnea (Exercise)  0    Symptoms  none    Duration  Continue with 45 min of aerobic exercise without signs/symptoms of physical distress.    Intensity  THRR unchanged      Progression   Progression  Continue to progress workloads to maintain intensity without signs/symptoms of physical distress.    Average METs  1.8      Resistance Training   Training Prescription  Yes    Weight  6 lb    Reps  10-15      Oxygen   Oxygen  Continuous    Liters  4      Recumbant Elliptical   Level  2    RPM  50    Minutes  15    METs  1.7      T5 Nustep   Level  5    SPM  80    Minutes  15    METs  1.9      Home Exercise Plan   [facility] to [facility]  [employer] [employer]    Frequency  Add 2 additional days to program exercise sessions.    Initial Home Exercises Provided  06/27/17       Nutrition:  Target Goals: Understanding of nutrition guidelines, daily intake of sodium <15064m cholesterol <20034mcalories 30% from fat and 7% or less from saturated fats, daily to have 5 or more servings of fruits and vegetables.  Biometrics: Pre Biometrics - 05/14/17 1557      Pre Biometrics   Height  5' 9.3" (1.76 m)    Weight  258 lb 14.4 oz (117.4 kg)    Waist Circumference  48.75 inches    Hip Circumference  43.5 inches    Waist to Hip Ratio  1.12 %    BMI (Calculated)  37.91        Nutrition Therapy Plan and Nutrition Goals: Nutrition Therapy & Goals - 05/25/17 1150      Nutrition Therapy   Diet  Diabetic/ TLC    Protein (specify units)  150g  Fiber  35 grams    Saturated Fats  16 max. grams    Fruits and Vegetables  6 servings/day    Sodium  2000 grams      Personal Nutrition Goals   Nutrition Goal  Pair sources of fiber and protein together with  carbohydrates to help improve blood glucose control    Personal Goal #2  Practice portion control when snacking, particularly while watching TV    Personal Goal #3  Make pie and candy bars "sometimes" foods      Intervention Plan   Intervention  Prescribe, educate and counsel regarding individualized specific dietary modifications aiming towards targeted core components such as weight, hypertension, lipid management, diabetes, heart failure and other comorbidities.;Nutrition handout(s) given to patient.    Expected Outcomes  Short Term Goal: Understand basic principles of dietary content, such as calories, fat, sodium, cholesterol and nutrients.;Short Term Goal: A plan has been developed with personal nutrition goals set during dietitian appointment.;Long Term Goal: Adherence to prescribed nutrition plan.       Nutrition Assessments:   Nutrition Goals Re-Evaluation: Nutrition Goals Re-Evaluation    [place] Name 07/02/17 1031             Goals   Current Weight  250 lb (113.4 kg)       Nutrition Goal  Pair sources of fiber and protein together with carbohydrates to help improve blood glucose control, portion control, limit pie and candy bars.       Comment  He has been taking fiber pills to help increase fiber in his diet.  He is eating 5 smaller meals a day and watching portion control more.  He also has protein drinks available to boost protein but has been eating [place], chicken, and fish and beans.  He has tried to start to limit his candy bar.       Expected Outcome  Short: Continue with 5 small meals a day.  Long; Continue to watch portion control.           Nutrition Goals Discharge (Final Nutrition Goals Re-Evaluation): Nutrition Goals Re-Evaluation - 07/02/17 1031      Goals   Current Weight  250 lb (113.4 kg)    Nutrition Goal  Pair sources of fiber and protein together with carbohydrates to help improve blood glucose control, portion control, limit pie and candy bars.     Comment  He has been taking fiber pills to help increase fiber in his diet.  He is eating 5 smaller meals a day and watching portion control more.  He also has protein drinks available to boost protein but has been eating [place], chicken, and fish and beans.  He has tried to start to limit his candy bar.    Expected Outcome  Short: Continue with 5 small meals a day.  Long; Continue to watch portion control.        Psychosocial: Target Goals: Acknowledge presence or absence of significant depression and/or stress, maximize coping skills, provide positive support system. Participant is able to verbalize types and ability to use techniques and skills needed for reducing stress and depression.   Initial Review & Psychosocial Screening: Initial Psych Review & Screening - 05/14/17 1422      Initial Review   Current issues with  Current Stress Concerns    Source of Stress Concerns  Chronic Illness    Comments  Patients COPD is his main factor      Family Dynamics   Good Support System?  Yes    Comments  His girlfriend is good for his support.      Barriers   Psychosocial barriers to participate in program  The patient should benefit from training in stress management and relaxation.      Screening Interventions   Interventions  Encouraged to exercise;Program counselor consult;To provide support and resources with identified psychosocial needs;Provide feedback about the scores to participant;Yes    Expected Outcomes  Long Term goal: The participant improves quality of Life and PHQ9 Scores as seen by post scores and/or verbalization of changes;Short Term goal: Identification and review with participant of any Quality of Life or Depression concerns found by scoring the questionnaire.;Long Term Goal: Stressors or current issues are controlled or eliminated.;Short Term goal: Utilizing psychosocial counselor, staff and physician to assist with identification of specific Stressors or current issues  interfering with healing process. Setting desired goal for each stressor or current issue identified.       Quality of Life Scores:  Scores of 19 and below usually indicate a poorer quality of life in these areas.  A difference of  2-3 points is a clinically meaningful difference.  A difference of 2-3 points in the total score of the Quality of Life Index has been associated with significant improvement in overall quality of life, self-image, physical symptoms, and general health in studies assessing change in quality of life.  PHQ-9: Recent Review Flowsheet Data    Depression screen [facility] 2/9 06/18/2017 05/14/2017 04/04/2016 02/15/2016   Decreased Interest '2 2 2 ' 0   Down, Depressed, Hopeless 0 0 2 0   PHQ - 2 Score '2 2 4 ' 0   Altered sleeping 0 3 1 -   Tired, decreased energy '3 3 3 ' -   Change in appetite '2 2 1 ' -   Feeling bad or failure about yourself  0 3 0 -   Trouble concentrating '1 2 1 ' -   Moving slowly or fidgety/restless 0 0 0 -   Suicidal thoughts 0 0 0 -   PHQ-9 Score '8 15 10 ' -   Difficult doing work/chores Somewhat difficult Somewhat difficult Somewhat difficult -     Interpretation of Total Score  Total Score Depression Severity:  1-4 = Minimal depression, 5-9 = Mild depression, 10-14 = Moderate depression, 15-19 = Moderately severe depression, 20-27 = Severe depression   Psychosocial Evaluation and Intervention: Psychosocial Evaluation - 06/18/17 1050      Psychosocial Evaluation & Interventions   Interventions  Encouraged to exercise with the program and follow exercise prescription;Stress management education    Comments  Counselor met with "Mario [name]" today as he has returned to this program after a year due to increased breathing difficulties.  He has a strong support system with lots of relatives close by and one of his daughters may be moving into his home in the next month or so.  Mario [name] has significant knee problems and diabetes in addition to his pulmonary health issues.  He  reports sleeping better and having less appetite recently.  He denies a history of depression or anxiety or any current symptoms.  Counselor reviewed with Mario [name] the PHQ-9 scores or "15" indicating moderately severe for depression.  He reported and "8" today and this was put into EPIC stating his primary mood concerns involve his health as his primary stressor currently.  He also is struggling with some much needed home repairs.  Mario [name] has goals to breathe better and walk better while in this program.  Staff will  follow with him throughout the course of this program.      Expected Outcomes  Mario [name] will benefit from consistent exercise to achieve his stated goals.  He will be followed by staff.      Continue Psychosocial Services   Follow up required by staff       Psychosocial Re-Evaluation: Psychosocial Re-Evaluation    Circle Name 07/02/17 1035             Psychosocial Re-Evaluation   Current issues with  Current Sleep Concerns;Current Stress Concerns       Comments  Mario [name] has been doing well in rehab.  He is struggling with not being able to do what he wants.  He would like to be able to go hard all out and not have to worry about his breathing.  Unforturnately, he has to slow down to work on his breathing.   He thinks that class is really helping him. His knee continues to limit his ability to exercise.  We talked about icing to help control the pain.  He has been doing well at home and maintains as best he can.  He continues to sleep well.       Expected Outcomes  Short: Continue to work on buidling strength and stamina  and weight loss.  Long: Continue to cope well with deconditioning.        Interventions  Stress management education;Encouraged to attend Pulmonary Rehabilitation for the exercise       Continue Psychosocial Services   Follow up required by staff       Comments  Patients COPD is his main factor         Initial Review   Source of Stress Concerns  Chronic Illness;Unable to  participate in former interests or hobbies;Unable to perform yard/household activities          Psychosocial Discharge (Final Psychosocial Re-Evaluation): Psychosocial Re-Evaluation - 07/02/17 1035      Psychosocial Re-Evaluation   Current issues with  Current Sleep Concerns;Current Stress Concerns    Comments  Mario [name] has been doing well in rehab.  He is struggling with not being able to do what he wants.  He would like to be able to go hard all out and not have to worry about his breathing.  Unforturnately, he has to slow down to work on his breathing.   He thinks that class is really helping him. His knee continues to limit his ability to exercise.  We talked about icing to help control the pain.  He has been doing well at home and maintains as best he can.  He continues to sleep well.    Expected Outcomes  Short: Continue to work on buidling strength and stamina  and weight loss.  Long: Continue to cope well with deconditioning.     Interventions  Stress management education;Encouraged to attend Pulmonary Rehabilitation for the exercise    Continue Psychosocial Services   Follow up required by staff    Comments  Patients COPD is his main factor      Initial Review   Source of Stress Concerns  Chronic Illness;Unable to participate in former interests or hobbies;Unable to perform yard/household activities       Education: Education Goals: Education classes will be provided on a weekly basis, covering required topics. Participant will state understanding/return demonstration of topics presented.  Learning Barriers/Preferences: Learning Barriers/Preferences - 05/14/17 1441      Learning Barriers/Preferences   Learning Barriers  None  Learning Preferences  None       Education Topics:  Initial Evaluation Education: - Verbal, written and demonstration of respiratory meds, oximetry and breathing techniques. Instruction on use of nebulizers and MDIs and importance of monitoring MDI  activations.   Pulmonary Rehab from 07/04/2017 in [facility] Cardiac and Pulmonary Rehab  Date  05/14/17  Educator  [facility]  Instruction Review Code  1- Verbalizes Understanding      General Nutrition Guidelines/Fats and Fiber: -Group instruction provided by verbal, written material, models and posters to present the general guidelines for heart healthy nutrition. Gives an explanation and review of dietary fats and fiber.   Controlling Sodium/Reading Food Labels: -Group verbal and written material supporting the discussion of sodium use in heart healthy nutrition. Review and explanation with models, verbal and written materials for utilization of the food label.   Exercise Physiology & General Exercise Guidelines: - Group verbal and written instruction with models to review the exercise physiology of the cardiovascular system and associated critical values. Provides general exercise guidelines with specific guidelines to those with heart or lung disease.    Pulmonary Rehab from 05/03/2016 in [facility] Cardiac and Pulmonary Rehab  Date  05/03/16  Educator  [facility]  Instruction Review Code (retired)  2- meets goals/outcomes      Aerobic Exercise & Resistance Training: - Gives group verbal and written instruction on the various components of exercise. Focuses on aerobic and resistive training programs and the benefits of this training and how to safely progress through these programs.   Flexibility, Balance, Mind/Body Relaxation: Provides group verbal/written instruction on the benefits of flexibility and balance training, including mind/body exercise modes such as yoga, pilates and tai chi.  Demonstration and skill practice provided.   Pulmonary Rehab from 07/04/2017 in [facility] Cardiac and Pulmonary Rehab  Date  06/13/17  Educator  [facility]  Instruction Review Code  1- Verbalizes Understanding      Stress and Anxiety: - Provides group verbal and written instruction about the health risks of elevated stress and  causes of high stress.  Discuss the correlation between heart/lung disease and anxiety and treatment options. Review healthy ways to manage with stress and anxiety.   Pulmonary Rehab from 07/04/2017 in [facility] Cardiac and Pulmonary Rehab  Date  07/04/17  Educator  [facility]  Instruction Review Code  1- Verbalizes Understanding      Depression: - Provides group verbal and written instruction on the correlation between heart/lung disease and depressed mood, treatment options, and the stigmas associated with seeking treatment.   Exercise & Equipment Safety: - Individual verbal instruction and demonstration of equipment use and safety with use of the equipment.   Pulmonary Rehab from 07/04/2017 in [facility] Cardiac and Pulmonary Rehab  Date  05/14/17  Educator  [facility]  Instruction Review Code  1- Verbalizes Understanding      Infection Prevention: - Provides verbal and written material to individual with discussion of infection control including proper hand washing and proper equipment cleaning during exercise session.   Pulmonary Rehab from 07/04/2017 in [facility] Cardiac and Pulmonary Rehab  Date  05/14/17  Educator  [facility]  Instruction Review Code  1- Verbalizes Understanding      Falls Prevention: - Provides verbal and written material to individual with discussion of falls prevention and safety.   Pulmonary Rehab from 07/04/2017 in [facility] Cardiac and Pulmonary Rehab  Date  05/14/17  Educator  [facility]  Instruction Review Code  1- Verbalizes Understanding      Diabetes: - Individual  verbal and written instruction to review signs/symptoms of diabetes, desired ranges of glucose level fasting, after meals and with exercise. Advice that pre and post exercise glucose checks will be done for 3 sessions at entry of program.   Pulmonary Rehab from 05/03/2016 in [facility] Cardiac and Pulmonary Rehab  Date  04/04/16  Educator  [facility]  Instruction Review Code (retired)  2- meets goals/outcomes      Chronic Lung Diseases: - Group  verbal and written instruction to review updates, respiratory medications, advancements in procedures and treatments. Discuss use of supplemental oxygen including available portable oxygen systems, continuous and intermittent flow rates, concentrators, personal use and safety guidelines. Review proper use of inhaler and spacers. Provide informative websites for self-education.    Energy Conservation: - Provide group verbal and written instruction for methods to conserve energy, plan and organize activities. Instruct on pacing techniques, use of adaptive equipment and posture/positioning to relieve shortness of breath.   Pulmonary Rehab from 07/04/2017 in [facility] Cardiac and Pulmonary Rehab  Date  06/27/17  Educator  [facility]  Instruction Review Code  1- Verbalizes Understanding      Triggers and Exacerbations: - Group verbal and written instruction to review types of environmental triggers and ways to prevent exacerbations. Discuss weather changes, air quality and the benefits of nasal washing. Review warning signs and symptoms to help prevent infections. Discuss techniques for effective airway clearance, coughing, and vibrations.   Pulmonary Rehab from 05/03/2016 in [facility] Cardiac and Pulmonary Rehab  Date  04/26/16  Educator  [facility]  Instruction Review Code (retired)  2- meets goals/outcomes      AED/CPR: - Group verbal and written instruction with the use of models to demonstrate the basic use of the AED with the basic ABC's of resuscitation.   Pulmonary Rehab from 07/04/2017 in [facility] Cardiac and Pulmonary Rehab  Date  06/29/17  Educator  [facility]  Instruction Review Code  1- Actuary and Physiology of the Lungs: - Group verbal and written instruction with the use of models to provide basic lung anatomy and physiology related to function, structure and complications of lung disease.   Anatomy & Physiology of the Heart: - Group verbal and written instruction and models provide  basic cardiac anatomy and physiology, with the coronary electrical and arterial systems. Review of Valvular disease and Heart Failure   Cardiac Medications: - Group verbal and written instruction to review commonly prescribed medications for heart disease. Reviews the medication, class of the drug, and side effects.   Pulmonary Rehab from 07/04/2017 in [facility] Cardiac and Pulmonary Rehab  Date  06/15/17  Educator  [facility]  Instruction Review Code  1- Verbalizes Understanding      Know Your Numbers and Risk Factors: -Group verbal and written instruction about important numbers in your health.  Discussion of what are risk factors and how they play a role in the disease process.  Review of Cholesterol, Blood Pressure, Diabetes, and BMI and the role they play in your overall health.   Sleep Hygiene: -Provides group verbal and written instruction about how sleep can affect your health.  Define sleep hygiene, discuss sleep cycles and impact of sleep habits. Review good sleep hygiene tips.    Other: -Provides group and verbal instruction on various topics (see comments)   Pulmonary Rehab from 07/04/2017 in [facility] Cardiac and Pulmonary Rehab  Date  05/23/17  Educator  [facility]  Instruction Review Code  1- Verbalizes Understanding [SLEEP]  Knowledge Questionnaire Score: Knowledge Questionnaire Score - 05/14/17 1424      Knowledge Questionnaire Score   Pre Score  14/18 reviewed with patient        Core Components/Risk Factors/Patient Goals at Admission: Personal Goals and Risk Factors at Admission - 05/14/17 1435      Core Components/Risk Factors/Patient Goals on Admission    Weight Management  Yes;Weight Loss    Intervention  Weight Management: Develop a combined nutrition and exercise program designed to reach desired caloric intake, while maintaining appropriate intake of nutrient and fiber, sodium and fats, and appropriate energy expenditure required for the weight goal.;Weight Management:  Provide education and appropriate resources to help participant work on and attain dietary goals.    Admit Weight  258 lb 14.4 oz (117.4 kg)    Goal Weight: Short Term  253 lb (114.8 kg)    Goal Weight: Long Term  248 lb (112.5 kg)    Expected Outcomes  Short Term: Continue to assess and modify interventions until short term weight is achieved;Long Term: Adherence to nutrition and physical activity/exercise program aimed toward attainment of established weight goal;Understanding recommendations for meals to include 15-35% energy as protein, 25-35% energy from fat, 35-60% energy from carbohydrates, less than 211m of dietary cholesterol, 20-35 gm of total fiber daily;Understanding of distribution of calorie intake throughout the day with the consumption of 4-5 meals/snacks;Weight Loss: Understanding of general recommendations for a balanced deficit meal plan, which promotes 1-2 lb weight loss per week and includes a negative energy balance of [phone] kcal/d    Improve shortness of breath with ADL's  Yes    Intervention  Provide education, individualized exercise plan and daily activity instruction to help decrease symptoms of SOB with activities of daily living.    Expected Outcomes  Short Term: Achieves a reduction of symptoms when performing activities of daily living.    Diabetes  Yes    Intervention  Provide education about signs/symptoms and action to take for hypo/hyperglycemia.;Provide education about proper nutrition, including hydration, and aerobic/resistive exercise prescription along with prescribed medications to achieve blood glucose in normal ranges: Fasting glucose 65-99 mg/dL    Expected Outcomes  Short Term: Participant verbalizes understanding of the signs/symptoms and immediate care of hyper/hypoglycemia, proper foot care and importance of medication, aerobic/resistive exercise and nutrition plan for blood glucose control.;Long Term: Attainment of HbA1C < 7%.    Heart Failure  Yes     Intervention  Provide a combined exercise and nutrition program that is supplemented with education, support and counseling about heart failure. Directed toward relieving symptoms such as shortness of breath, decreased exercise tolerance, and extremity edema.    Expected Outcomes  Improve functional capacity of life;Short term: Attendance in program 2-3 days a week with increased exercise capacity. Reported lower sodium intake. Reported increased fruit and vegetable intake. Reports medication compliance.;Short term: Daily weights obtained and reported for increase. Utilizing diuretic protocols set by physician.;Long term: Adoption of self-care skills and reduction of barriers for early signs and symptoms recognition and intervention leading to self-care maintenance.    Hypertension  -- states he is not on blood pressure medication       Core Components/Risk Factors/Patient Goals Review:  Goals and Risk Factor Review    Row Name 07/02/17 1026             Core Components/Risk Factors/Patient Goals Review   Personal Goals Review  Weight Management/Obesity;Improve shortness of breath with ADL's;Heart Failure;Diabetes  Review  Mario [name] is doing well in rehab. He continues to [facility] with his weight, but his fluid is down overall with exercising routining.   His blood sugars have been lower overall and he has noticed that exercise is helping.  He has not had symptoms of heart faillure recently and weighs daily.  He has reduced the amount of sodium that he is eating.  Mario [name]'s SOB is getting better and no he is able to walk upstairs without panicing because of SOB.         Expected Outcomes  Short: Continue to work on weight loss with diet and exercise.  Long: Continue to manage heart failure symptoms.           Core Components/Risk Factors/Patient Goals at Discharge (Final Review):  Goals and Risk Factor Review - 07/02/17 1026      Core Components/Risk Factors/Patient Goals Review   Personal Goals  Review  Weight Management/Obesity;Improve shortness of breath with ADL's;Heart Failure;Diabetes    Review  Mario [name] is doing well in rehab. He continues to [facility] with his weight, but his fluid is down overall with exercising routining.   His blood sugars have been lower overall and he has noticed that exercise is helping.  He has not had symptoms of heart faillure recently and weighs daily.  He has reduced the amount of sodium that he is eating.  Mario [name]'s SOB is getting better and no he is able to walk upstairs without panicing because of SOB.      Expected Outcomes  Short: Continue to work on weight loss with diet and exercise.  Long: Continue to manage heart failure symptoms.        ITP Comments: ITP Comments    Row Name 05/14/17 1357 05/28/17 0936 06/08/17 0916 06/11/17 0857 07/09/17 0810   ITP Comments  Medical Evaluation completed. Chart sent for review and changes to Dr. Emily Filbert Director of [place]. Diagnosis can be found in [facility] encounter 05/14/16  Mario [name] called to say that he is feeling sick and would not be in [place] this morning.  Mario [name] called to say he went to the doctors on Monday. He had a high Co2. The doctor wants him to be on home oxygen at 2 liters continuous. He is going to advanced today to talk with them about home oxygen. He has been feeling tired and we informed him to come to rehab and exercise to get his energy back. He states that his car is in the shop and he will not be in class today but states he will be in Class next week.  Markail has been sick to obtain his 30 days notes. ITP 30 day review sent to Dr. Emily Filbert director of [place] for reviews and changes.  ITP 30 day review sent to Dr. Emily Filbert Director of [place] for review and changes.      Comments: 30 day review

## 2017-07-11 ENCOUNTER — Encounter: Payer: Medicare Other | Admitting: *Deleted

## 2017-07-11 DIAGNOSIS — J449 Chronic obstructive pulmonary disease, unspecified: Secondary | ICD-10-CM

## 2017-07-11 NOTE — Progress Notes (Signed)
Daily Session Note  Patient Details  Name: Mario [name] MRN: [id number] Date of Birth: [birth year] Referring Provider:     Pulmonary Rehab from 05/14/2017 in [facility] Cardiac and Pulmonary Rehab  Referring Provider  Ezequiel Kayser MD      Encounter Date: 07/11/2017  Check In: Session Check In - 07/11/17 1005      Check-In   Location  [facility]-Cardiac & Pulmonary Rehab    Staff Present  Alberteen Sam, MA, RCEP, CCRP, Exercise Physiologist;Amanda Oletta Darter, BA, ACSM CEP, Exercise Physiologist;Joseph Flavia Shipper    Supervising physician immediately available to respond to emergencies  [name] immediately available ER MD    Physician(s)  Drs. Lord and [employer]    Medication changes reported      No    Fall or balance concerns reported     No    Warm-up and Cool-down  Performed as group-led Higher education careers adviser Performed  Yes    VAD Patient?  No      Pain Assessment   Currently in Pain?  No/denies          Social History   Tobacco Use  Smoking Status Former Smoker  . Packs/day: 0.50  . Years: 1.00  . Pack years: 0.50  . Types: Cigarettes  . Last attempt to quit: 05/01/1969  . Years since quitting: 48.2  Smokeless Tobacco Never Used    Goals Met:  Proper associated with RPD/PD & O2 Sat Independence with exercise equipment Using PLB without cueing & demonstrates good technique Exercise tolerated well No report of cardiac concerns or symptoms Strength training completed today  Goals Unmet:  Not Applicable  Comments: Pt able to follow exercise prescription today without complaint.  Will continue to monitor for progression.    Dr. Emily Filbert is Medical Director for [place] and [name].

## 2017-07-16 DIAGNOSIS — J449 Chronic obstructive pulmonary disease, unspecified: Secondary | ICD-10-CM

## 2017-07-16 NOTE — Progress Notes (Signed)
Daily Session Note  Patient Details  Name: Mario [name] MRN: [id number] Date of Birth: 1946-02-17 Referring Provider:     Pulmonary Rehab from 05/14/2017 in [facility] Cardiac and Pulmonary Rehab  Referring Provider  Mario Kayser MD      Encounter Date: 07/16/2017  Check In: Session Check In - 07/16/17 0945      Check-In   Location  [facility] Pulmonary Re[facility]    Staff Present  Earlean Shawl, BS, ACSM CEP, Exercise Physiologist;Amanda Oletta Darter, BA, ACSM CEP, Exercise Physiologist;Domanik [place] Flavia Shipper    Supervising physician immediately available to respond to emergencies  [name] immediately available ER MD    Physician(s)  Dr. Burlene Arnt and Corky Downs    Medication changes reported      No    Fall or balance concerns reported     No    Tobacco Cessation  No Change    Warm-up and Cool-down  Performed as group-led instruction    Resistance Training Performed  Yes    VAD Patient?  No      Pain Assessment   Currently in Pain?  No/denies          Social History   Tobacco Use  Smoking Status Former Smoker  . Packs/day: 0.50  . Years: 1.00  . Pack years: 0.50  . Types: Cigarettes  . Last attempt to quit: 05/01/1969  . Years since quitting: 48.2  Smokeless Tobacco Never Used    Goals Met:  Independence with exercise equipment Exercise tolerated well No report of cardiac concerns or symptoms Strength training completed today  Goals Unmet:  Not Applicable  Comments: Pt able to follow exercise prescription today without complaint.  Will continue to monitor for progression.   Dr. Emily [name] is Medical Director for [place] and [name].

## 2017-07-20 ENCOUNTER — Encounter: Payer: Medicare Other | Admitting: *Deleted

## 2017-07-20 DIAGNOSIS — J449 Chronic obstructive pulmonary disease, unspecified: Secondary | ICD-10-CM | POA: Diagnosis not present

## 2017-07-20 NOTE — Progress Notes (Signed)
Daily Session Note  Patient Details  Name: Taim Wurm MRN: [id number] Date of Birth: 10-01-1945 Referring Provider:     Pulmonary Rehab from 05/14/2017 in [facility] Cardiac and Pulmonary Rehab  Referring Provider  Ezequiel Kayser MD      Encounter Date: 07/20/2017  Check In: Session Check In - 07/20/17 1012      Check-In   Location  [facility]Cardiac [facility] Pulmonary Re[facility]    Staff Present  Renita Papa, RN Vickki Hearing, BA, ACSM CEP, Exercise Physiologist;Joseph Flavia Shipper    Supervising physician immediately available to respond to emergencies  [name] immediately available ER MD    Physician(s)  Drs. Joni Fears and [place]    Medication changes reported      No    Fall or balance concerns reported     No    Warm-up and Cool-down  Performed on first and last piece of equipment    Resistance Training Performed  Yes    VAD Patient?  No      Pain Assessment   Currently in Pain?  No/denies          Social History   Tobacco Use  Smoking Status Former Smoker  . Packs/day: 0.50  . Years: 1.00  . Pack years: 0.50  . Types: Cigarettes  . Last attempt to quit: 05/01/1969  . Years since quitting: 48.2  Smokeless Tobacco Never Used    Goals Met:  Proper associated with RPD/PD & O2 Sat Independence with exercise equipment Using PLB without cueing & demonstrates good technique Exercise tolerated well No report of cardiac concerns or symptoms Strength training completed today  Goals Unmet:  Not Applicable  Comments: Pt able to follow exercise prescription today without complaint.  Will continue to monitor for progression.    Dr. Emily Filbert is Medical Director for [place] and [name].

## 2017-07-23 DIAGNOSIS — J449 Chronic obstructive pulmonary disease, unspecified: Secondary | ICD-10-CM

## 2017-07-23 NOTE — Progress Notes (Signed)
Daily Session Note  Patient Details  Name: Mario [name] MRN: [id number] Date of Birth: [birth year] Referring Provider:     Pulmonary Rehab from 05/14/2017 in [facility] Cardiac and Pulmonary Rehab  Referring Provider  Ezequiel Kayser MD      Encounter Date: 07/23/2017  Check In: Session Check In - 07/23/17 1015      Check-In   Location  [facility] Pul[facility] Re[facility]    Staff Present  Earlean Shawl, BS, ACSM CEP, Exercise Physiologist;Amanda Oletta Darter, BA, ACSM CEP, Exercise Physiologist;Kelijah Towry Flavia Shipper    Supervising physician immediately available to respond to emergencies  [name] immediately available ER MD    Physician(s)  Dr. Alfred Levins and Siadecki    Medication changes reported      No    Fall or balance concerns reported     No    Tobacco Cessation  No Change    Warm-up and Cool-down  Performed as group-led instruction    Resistance Training Performed  Yes    VAD Patient?  No      Pain Assessment   Currently in Pain?  No/denies          Social History   Tobacco Use  Smoking Status Former Smoker  . Packs/day: 0.50  . Years: 1.00  . Pack years: 0.50  . Types: Cigarettes  . Last attempt to quit: 05/01/1969  . Years since quitting: 48.2  Smokeless Tobacco Never Used    Goals Met:  Independence with exercise equipment Exercise tolerated well No report of cardiac concerns or symptoms Strength training completed today  Goals Unmet:  Not Applicable  Comments: Pt able to follow exercise prescription today without complaint.  Will continue to monitor for progression.   Dr. Emily Filbert is Medical Director for [place] and [name].

## 2017-07-25 DIAGNOSIS — J449 Chronic obstructive pulmonary disease, unspecified: Secondary | ICD-10-CM

## 2017-07-25 NOTE — Progress Notes (Signed)
Daily Session Note  Patient Details  Name: Mario [name] MRN: [id number] Date of Birth: 16-Jan-1946 Referring Provider:     Pulmonary Rehab from 05/14/2017 in [facility] Cardiac and Pulmonary Rehab  Referring Provider  Ezequiel Kayser MD      Encounter Date: 07/25/2017  Check In: Session Check In - 07/25/17 1018      Check-In   Location  [facility] Pul[facility] Re[facility]    Staff Present  Alberteen Sam, MA, RCEP, CCRP, Exercise Physiologist;Amanda Oletta Darter, BA, ACSM CEP, Exercise Physiologist;Joseph Flavia Shipper    Supervising physician immediately available to respond to emergencies  [name] immediately available ER MD    Physician(s)  Dr. Mariea Clonts and Jimmye Norman    Medication changes reported      No    Fall or balance concerns reported     No    Tobacco Cessation  No Change    Warm-up and Cool-down  Performed as group-led instruction    Resistance Training Performed  Yes    VAD Patient?  No      Pain Assessment   Currently in Pain?  No/denies          Social History   Tobacco Use  Smoking Status Former Smoker  . Packs/day: 0.50  . Years: 1.00  . Pack years: 0.50  . Types: Cigarettes  . Last attempt to quit: 05/01/1969  . Years since quitting: 48.2  Smokeless Tobacco Never Used    Goals Met:  Independence with exercise equipment Exercise tolerated well No report of cardiac concerns or symptoms Strength training completed today  Goals Unmet:  Not Applicable  Comments: Pt able to follow exercise prescription today without complaint.  Will continue to monitor for progression.   Dr. Emily Filbert is Medical Director for [place] and [name].

## 2017-07-27 ENCOUNTER — Encounter: Payer: Medicare Other | Admitting: *Deleted

## 2017-07-27 DIAGNOSIS — J449 Chronic obstructive pulmonary disease, unspecified: Secondary | ICD-10-CM | POA: Diagnosis not present

## 2017-07-27 NOTE — Progress Notes (Signed)
Daily Session Note  Patient Details  Name: Abdulwahab Demelo MRN: [id number] Date of Birth: 05-22-1945 Referring Provider:     Pulmonary Rehab from 05/14/2017 in [facility] Cardiac and Pulmonary Rehab  Referring Provider  Ezequiel Kayser MD      Encounter Date: 07/27/2017  Check In: Session Check In - 07/27/17 1016      Check-In   Location  [facility] Re[facility]    Staff Present  Nyoka Cowden, RN, BSN, MA;Mariaelena Cade Sherryll Burger, RN Vickki Hearing, BA, ACSM CEP, Exercise Physiologist    Supervising physician immediately available to respond to emergencies  [name] immediately available ER MD    Physician(s)  Dr. Jacqualine Code and Reita Cliche    Medication changes reported      No    Fall or balance concerns reported     No    Warm-up and Cool-down  Performed as group-led instruction    Resistance Training Performed  Yes    VAD Patient?  No      Pain Assessment   Currently in Pain?  No/denies          Social History   Tobacco Use  Smoking Status Former Smoker  . Packs/day: 0.50  . Years: 1.00  . Pack years: 0.50  . Types: Cigarettes  . Last attempt to quit: 05/01/1969  . Years since quitting: 48.2  Smokeless Tobacco Never Used    Goals Met:  Proper associated with RPD/PD & O2 Sat Independence with exercise equipment Using PLB without cueing & demonstrates good technique Exercise tolerated well Strength training completed today  Goals Unmet:  Not Applicable  Comments: Pt able to follow exercise prescription today without complaint.  Will continue to monitor for progression.    Dr. Emily Filbert is Medical Director for [place] and [name].

## 2017-07-30 ENCOUNTER — Encounter: Payer: Medicare Other | Attending: Internal Medicine | Admitting: *Deleted

## 2017-07-30 DIAGNOSIS — I509 Heart failure, unspecified: Secondary | ICD-10-CM | POA: Insufficient documentation

## 2017-07-30 DIAGNOSIS — J449 Chronic obstructive pulmonary disease, unspecified: Secondary | ICD-10-CM | POA: Insufficient documentation

## 2017-07-30 DIAGNOSIS — R0902 Hypoxemia: Secondary | ICD-10-CM | POA: Insufficient documentation

## 2017-07-30 NOTE — Progress Notes (Signed)
Daily Session Note  Patient Details  Name: Mario [name] MRN: [id number] Date of Birth: [birth year] Referring Provider:     Pulmonary Rehab from 05/14/2017 in [facility] Cardiac and Pulmonary Rehab  Referring Provider  Ezequiel Kayser MD      Encounter Date: 07/30/2017  Check In: Session Check In - 07/30/17 1013      Check-In   Location  [facility] Pul[facility] Re[facility]    Staff Present  Nada Maclachlan, BA, ACSM CEP, Exercise Physiologist;Jahid Weida Amedeo Plenty, BS, ACSM CEP, Exercise Physiologist;Joseph Flavia Shipper    Supervising physician immediately available to respond to emergencies  [name] immediately available ER MD    Physician(s)  Drs. Kinner and [place]     Medication changes reported      No    Fall or balance concerns reported     No    Warm-up and Cool-down  Performed on first and last piece of equipment    Resistance Training Performed  Yes    VAD Patient?  No      Pain Assessment   Currently in Pain?  No/denies    Multiple Pain Sites  No          Social History   Tobacco Use  Smoking Status Former Smoker  . Packs/day: 0.50  . Years: 1.00  . Pack years: 0.50  . Types: Cigarettes  . Last attempt to quit: 05/01/1969  . Years since quitting: 48.2  Smokeless Tobacco Never Used    Goals Met:  Proper associated with RPD/PD & O2 Sat Independence with exercise equipment Exercise tolerated well No report of cardiac concerns or symptoms Strength training completed today  Personal goals reviewed  Goals Unmet:  Not Applicable  Comments: Pt able to follow exercise prescription today without complaint.  Will continue to monitor for progression.    Dr. Emily Filbert is Medical Director for [place] and [name].

## 2017-08-01 DIAGNOSIS — J449 Chronic obstructive pulmonary disease, unspecified: Secondary | ICD-10-CM | POA: Diagnosis not present

## 2017-08-01 NOTE — Progress Notes (Signed)
Daily Session Note  Patient Details  Name: Mario [name] MRN: [id number] Date of Birth: 1945/09/14 Referring Provider:     Pulmonary Rehab from 05/14/2017 in [facility] Cardiac and Pulmonary Rehab  Referring Provider  Ezequiel Kayser MD      Encounter Date: 08/01/2017  Check In: Session Check In - 08/01/17 1016      Check-In   Location  [facility] Pulmonary Re[facility]    Staff Present  Nada Maclachlan, BA, ACSM CEP, Exercise Physiologist;Joseph Darrin Nipper, [place], RCEP, CCRP, Exercise Physiologist    Supervising physician immediately available to respond to emergencies  [name] immediately available ER MD    Physician(s)  Dr. Mable Paris and Jimmye Norman    Medication changes reported      No    Fall or balance concerns reported     No    Tobacco Cessation  No Change    Warm-up and Cool-down  Performed as group-led instruction    Resistance Training Performed  Yes    VAD Patient?  No      Pain Assessment   Currently in Pain?  No/denies          Social History   Tobacco Use  Smoking Status Former Smoker  . Packs/day: 0.50  . Years: 1.00  . Pack years: 0.50  . Types: Cigarettes  . Last attempt to quit: 05/01/1969  . Years since quitting: 48.2  Smokeless Tobacco Never Used    Goals Met:  Independence with exercise equipment Exercise tolerated well No report of cardiac concerns or symptoms Strength training completed today  Goals Unmet:  Not Applicable  Comments: Pt able to follow exercise prescription today without complaint.  Will continue to monitor for progression.   Dr. Emily Filbert is Medical Director for [place] and [name].

## 2017-08-06 DIAGNOSIS — J449 Chronic obstructive pulmonary disease, unspecified: Secondary | ICD-10-CM

## 2017-08-06 NOTE — Progress Notes (Signed)
Daily Session Note  Patient Details  Name: Oswell Say MRN: [id number] Date of Birth: 1945-07-30 Referring Provider:     Pulmonary Rehab from 05/14/2017 in [facility] Cardiac and Pulmonary Rehab  Referring Provider  Ezequiel Kayser MD      Encounter Date: 08/06/2017  Check In: Session Check In - 08/06/17 0953      Check-In   Location  [facility]iac [facility] Pulmonary Rehab    Staff Present  Earlean Shawl, BS, ACSM CEP, Exercise Physiologist;Amanda Oletta Darter, BA, ACSM CEP, Exercise Physiologist;Merlin Golden Flavia Shipper    Supervising physician immediately available to respond to emergencies  [name] immediately available ER MD    Physician(s)  Dr. Corky Downs and [facility]    Medication changes reported      No    Fall or balance concerns reported     No    Tobacco Cessation  No Change    Warm-up and Cool-down  Performed as group-led instruction    Resistance Training Performed  Yes    VAD Patient?  No      Pain Assessment   Currently in Pain?  No/denies          Social History   Tobacco Use  Smoking Status Former Smoker  . Packs/day: 0.50  . Years: 1.00  . Pack years: 0.50  . Types: Cigarettes  . Last attempt to quit: 05/01/1969  . Years since quitting: 48.2  Smokeless Tobacco Never Used    Goals Met:  Independence with exercise equipment Exercise tolerated well No report of cardiac concerns or symptoms Strength training completed today  Goals Unmet:  Not Applicable  Comments: Pt able to follow exercise prescription today without complaint.  Will continue to monitor for progression.   Dr. Emily Filbert is Medical Director for [place] and [name].

## 2017-08-06 NOTE — Progress Notes (Signed)
Pulmonary Individual Treatment Plan  Patient Details  Name: Camerin Ladouceur MRN: [id number] Date of Birth: 09/15/1945 Referring Provider:     Pulmonary Rehab from 05/14/2017 in [facility]  Referring Provider  Ezequiel Kayser MD      Initial Encounter Date:    Pulmonary Rehab from 05/14/2017 in [facility] Cardiac and Pulmonary Rehab  Date  05/14/17  Referring Provider  Ezequiel Kayser MD      Visit Diagnosis: Chronic obstructive pulmonary disease, unspecified COPD type ([place])  Patient's Home Medications on Admission:  Current Outpatient Medications:  .  albuterol (PROVENTIL HFA;VENTOLIN HFA) 108 (90 Base) MCG/ACT inhaler, Inhale 2 puffs into the lungs as needed., Disp: , Rfl:  .  amLODipine (NORVASC) 5 MG tablet, Take 1 tablet by mouth daily., Disp: , Rfl:  .  diclofenac sodium (VOLTAREN) 1 % GEL, Apply 1 application topically 4 (four) times daily as needed., Disp: , Rfl:  .  edoxaban (SAVAYSA) 60 MG TABS tablet, Take 60 mg by mouth daily. cardiology, Disp: , Rfl:  .  fluticasone-salmeterol (ADVAIR HFA) 230-21 MCG/ACT inhaler, Inhale 2 puffs into the lungs 2 (two) times daily., Disp: , Rfl:  .  furosemide (LASIX) 80 MG tablet, Take 1 tablet by mouth daily., Disp: , Rfl:  .  gabapentin (NEURONTIN) 300 MG capsule, Take 1 capsule by mouth 3 (three) times daily., Disp: , Rfl:  .  glucose blood (ONE TOUCH ULTRA TEST) test strip, Use as instructed FOR TESTING three times daily.  E11.9, Disp: , Rfl:  .  HYDROcodone-acetaminophen (NORCO/VICODIN) 5-325 MG tablet, Take 1 tablet by mouth every 4 (four) hours as needed for moderate pain., Disp: 20 tablet, Rfl: 0 .  ipratropium-albuterol (DUONEB) 0.5-2.5 (3) MG/3ML SOLN, Inhale into the lungs., Disp: , Rfl:  .  losartan (COZAAR) 50 MG tablet, Take 1 tablet by mouth daily., Disp: , Rfl:  .  metFORMIN (GLUCOPHAGE) 1000 MG tablet, Take 1 tablet by mouth 2 (two) times daily., Disp: , Rfl:  .  sotalol (BETAPACE) 80 MG tablet, Take 1 tablet by  mouth 2 (two) times daily., Disp: , Rfl:  .  terazosin (HYTRIN) 5 MG capsule, Take 1 capsule by mouth every other day., Disp: , Rfl:  .  umeclidinium bromide (INCRUSE ELLIPTA) 62.5 MCG/INH AEPB, Inhale 1 puff into the lungs daily., Disp: , Rfl:   Past Medical History: Past Medical History:  Diagnosis Date  . COPD (chronic obstructive pulmonary disease) ([place])   . Diabetes mellitus without complication ([place])   . Diabetic neuropathy ([place])   . Hypertension     Tobacco Use: Social History   Tobacco Use  Smoking Status Former Smoker  . Packs/day: 0.50  . Years: 1.00  . Pack years: 0.50  . Types: Cigarettes  . Last attempt to quit: 05/01/1969  . Years since quitting: 48.2  Smokeless Tobacco Never Used    Labs: Recent Chemical engineer    Labs for ITP Cardiac and Pulmonary Rehab Latest Ref Rng & Units 02/15/2016   Hemoglobin A1c 4.8 - 5.6 % 6.8(H)       Pulmonary Assessment Scores: Pulmonary Assessment Scores    Row Name 05/14/17 1425 06/26/17 1505 07/11/17 1014     ADL UCSD   ADL Phase  Entry  Entry  Mid   SOB Score total  51  -  37   Rest  1  -  0   Walk  3  -  1   Stairs  3  -  4  Bath  1  -  1   Dress  2  -  1   Shop  3  -  1     CAT Score   CAT Score  18  -  -     mMRC Score   mMRC Score  -  2  -      Pulmonary Function Assessment: Pulmonary Function Assessment - 05/14/17 1438      Initial Spirometry Results   FVC%  36 %    FEV1%  40 %    FEV1/FVC Ratio  76.26    Comments  best of two, good patient effort      Post Bronchodilator Spirometry Results   FVC%  47.16 %    FEV1%  46.8 %    FEV1/FVC Ratio  74.69    Comments  best of two, good patient effort      Breath   Bilateral Breath Sounds  Decreased;Clear    Shortness of Breath  Yes;Limiting activity       Exercise Target Goals:    Exercise Program Goal: Individual exercise prescription set using results from initial 6 min walk test and THRR while considering  patient's activity  barriers and safety.    Exercise Prescription Goal: Initial exercise prescription builds to 30-45 minutes a day of aerobic activity, 2-3 days per week.  Home exercise guidelines will be given to patient during program as part of exercise prescription that the participant will acknowledge.  Activity Barriers & Risk Stratification: Activity Barriers & Cardiac Risk Stratification - 05/14/17 1550      Activity Barriers & Cardiac Risk Stratification   Activity Barriers  Arthritis;Joint Problems;Deconditioning;Muscular Weakness;Shortness of Breath;Balance Concerns bilateral knee pain, worse on left side       6 Minute Walk: 6 Minute Walk    Row Name 05/14/17 1546         6 Minute Walk   Phase  Initial     Distance  630 feet     Walk Time  3.8 minutes     # of Rest Breaks  1 test terminated at 3:48     MPH  1.88     METS  1.35     RPE  9     Perceived Dyspnea   2     VO2 Peak  4.74     Symptoms  Yes (comment)     Comments  fatigue     Resting HR  78 bpm     Resting BP  132/74     Resting Oxygen Saturation   92 %     Exercise Oxygen Saturation  during 6 min walk  79 %     Max Ex. HR  116 bpm     Max Ex. BP  146/74     2 Minute Post BP  134/70       Interval HR   2 Minute HR  116     3 Minute HR  106     4 Minute HR  112 test terminated at 3:48     2 Minute Post HR  80     Interval Heart Rate?  Yes       Interval Oxygen   Interval Oxygen?  Yes     Baseline Oxygen Saturation %  92 %     1 Minute Oxygen Saturation %  84 %     1 Minute Liters of Oxygen  0 L Room Air     2  Minute Oxygen Saturation %  82 %     2 Minute Liters of Oxygen  0 L     3 Minute Oxygen Saturation %  81 % 80% 2:44     3 Minute Liters of Oxygen  0 L     4 Minute Oxygen Saturation %  79 % test terminated 3:48 and 78% 26mn     4 Minute Liters of Oxygen  0 L     5 Minute Oxygen Saturation %  85 %     5 Minute Liters of Oxygen  0 L     2 Minute Post Oxygen Saturation %  93 %     2 Minute Post Liters  of Oxygen  83 L       Oxygen Initial Assessment: Oxygen Initial Assessment - 05/14/17 1427      Home Oxygen   Home Oxygen Device  Portable Concentrator;E-Tanks    Sleep Oxygen Prescription  Continuous    Liters per minute  3.5    Home Exercise Oxygen Prescription  None    Liters per minute  3.5    Home at Rest Exercise Oxygen Prescription  None    Liters per minute  --    Compliance with Home Oxygen Use  Yes      Initial 6 min Walk   Oxygen Used  None      Program Oxygen Prescription   Program Oxygen Prescription  None      Intervention   Short Term Goals  To learn and demonstrate proper use of respiratory medications;To learn and understand importance of maintaining oxygen saturations>88%;To learn and exhibit compliance with exercise, home and travel O2 prescription;To learn and understand importance of monitoring SPO2 with pulse oximeter and demonstrate accurate use of the pulse oximeter.;To learn and demonstrate proper pursed lip breathing techniques or other breathing techniques.    Long  Term Goals  Exhibits compliance with exercise, home and travel O2 prescription;Verbalizes importance of monitoring SPO2 with pulse oximeter and return demonstration;Maintenance of O2 saturations>88%;Exhibits proper breathing techniques, such as pursed lip breathing or other method taught during program session;Compliance with respiratory medication;Demonstrates proper use of MDI's       Oxygen Re-Evaluation: Oxygen Re-Evaluation    Row Name 05/21/17 1019 07/02/17 1042 07/30/17 1058         Program Oxygen Prescription   Program Oxygen Prescription  Continuous  Continuous;E-Tanks  Continuous;E-Tanks     Liters per minute  '4  2  4     ' Comments  -  -  uses 6 on TM       Home Oxygen   Home Oxygen Device  Portable Concentrator;E-Tanks  Portable Concentrator;E-Tanks  Home Concentrator;E-Tanks     Sleep Oxygen Prescription  Continuous  Continuous  Continuous     Liters per minute  3.'5  2  3      ' Home Exercise Oxygen Prescription  Continuous  Continuous  Continuous     Liters per minute  3.'5  4  3     ' Home at Rest Exercise Oxygen Prescription  Continuous  Continuous  Continuous     Liters per minute  '4  4  3     ' Compliance with Home Oxygen Use  Yes  Yes  Yes       Goals/Expected Outcomes   Short Term Goals  To learn and demonstrate proper use of respiratory medications;To learn and understand importance of maintaining oxygen saturations>88%;To learn and exhibit compliance with exercise, home and travel  O2 prescription;To learn and understand importance of monitoring SPO2 with pulse oximeter and demonstrate accurate use of the pulse oximeter.;To learn and demonstrate proper pursed lip breathing techniques or other breathing techniques.  To learn and demonstrate proper use of respiratory medications;To learn and understand importance of maintaining oxygen saturations>88%;To learn and exhibit compliance with exercise, home and travel O2 prescription;To learn and understand importance of monitoring SPO2 with pulse oximeter and demonstrate accurate use of the pulse oximeter.;To learn and demonstrate proper pursed lip breathing techniques or other breathing techniques.  To learn and exhibit compliance with exercise, home and travel O2 prescription;To learn and understand importance of monitoring SPO2 with pulse oximeter and demonstrate accurate use of the pulse oximeter.;To learn and understand importance of maintaining oxygen saturations>88%;To learn and demonstrate proper pursed lip breathing techniques or other breathing techniques.;To learn and demonstrate proper use of respiratory medications     Long  Term Goals  Exhibits compliance with exercise, home and travel O2 prescription;Verbalizes importance of monitoring SPO2 with pulse oximeter and return demonstration;Maintenance of O2 saturations>88%;Exhibits proper breathing techniques, such as pursed lip breathing or other method taught during  program session;Compliance with respiratory medication;Demonstrates proper use of MDI's  Exhibits compliance with exercise, home and travel O2 prescription;Verbalizes importance of monitoring SPO2 with pulse oximeter and return demonstration;Maintenance of O2 saturations>88%;Exhibits proper breathing techniques, such as pursed lip breathing or other method taught during program session;Compliance with respiratory medication;Demonstrates proper use of MDI's  Exhibits compliance with exercise, home and travel O2 prescription;Verbalizes importance of monitoring SPO2 with pulse oximeter and return demonstration;Maintenance of O2 saturations>88%;Exhibits proper breathing techniques, such as pursed lip breathing or other method taught during program session;Compliance with respiratory medication;Demonstrates proper use of MDI's;Other     Comments  Reviewed PLB technique with pt.  Talked about how it work and it's important to maintaining his exercise saturations.    Nydia Bouton has been complaint with his oxygen use.  He uses his pulse oximeters regularly as he has two.  He does not use his PLB all the time, but he will use the PLB when he gets SOB.  He has been doing well with his inhaler and spacer.  He is good at maintaining his saturations.   Reviewed medications with [name] - worked on PLB. Informed [name] how he should take his medication in a certain order to get the most benifit from his medications. He was slightly short of breath when he showed up for class today but his oxygen tubing looked to be kinked.     Goals/Expected Outcomes  Short: Become more profiecient at using PLB.   Long: Become independent at using PLB.  Short: Continue to use PLB more frequently during any activity.  Long: Continued compliance with oxygen therapy.   Short: take medications in the coreect order. Long: maintain medications independently        Oxygen Discharge (Final Oxygen Re-Evaluation): Oxygen Re-Evaluation - 07/30/17 1058       Program Oxygen Prescription   Program Oxygen Prescription  Continuous;E-Tanks    Liters per minute  4    Comments  uses 6 on TM      Home Oxygen   Home Oxygen Device  Home Concentrator;E-Tanks    Sleep Oxygen Prescription  Continuous    Liters per minute  3    Home Exercise Oxygen Prescription  Continuous    Liters per minute  3    Home at Rest Exercise Oxygen Prescription  Continuous    Liters per minute  3    Compliance with Home Oxygen Use  Yes      Goals/Expected Outcomes   Short Term Goals  To learn and exhibit compliance with exercise, home and travel O2 prescription;To learn and understand importance of monitoring SPO2 with pulse oximeter and demonstrate accurate use of the pulse oximeter.;To learn and understand importance of maintaining oxygen saturations>88%;To learn and demonstrate proper pursed lip breathing techniques or other breathing techniques.;To learn and demonstrate proper use of respiratory medications    Long  Term Goals  Exhibits compliance with exercise, home and travel O2 prescription;Verbalizes importance of monitoring SPO2 with pulse oximeter and return demonstration;Maintenance of O2 saturations>88%;Exhibits proper breathing techniques, such as pursed lip breathing or other method taught during program session;Compliance with respiratory medication;Demonstrates proper use of MDI's;Other    Comments  Reviewed medications with [name] - worked on PLB. Informed [name] how he should take his medication in a certain order to get the most benifit from his medications. He was slightly short of breath when he showed up for class today but his oxygen tubing looked to be kinked.    Goals/Expected Outcomes  Short: take medications in the coreect order. Long: maintain medications independently       Initial Exercise Prescription: Initial Exercise Prescription - 05/14/17 1500      Date of Initial Exercise RX and Referring Provider   Date  05/14/17    Referring Provider  Ezequiel Kayser MD      Oxygen   Oxygen  Continuous    Liters  2      Treadmill   MPH  1.8    Grade  0    Minutes  15    METs  2.38      Recumbant Elliptical   Level  1    RPM  50    Minutes  15    METs  2      T5 Nustep   Level  1    SPM  80    Minutes  15    METs  2      Prescription Details   Frequency (times per week)  3    Duration  Progress to 45 minutes of aerobic exercise without signs/symptoms of physical distress      Intensity   THRR 40-80% of Max Heartrate  106-135    Ratings of Perceived Exertion  11-13    Perceived Dyspnea  0-4      Progression   Progression  Continue to progress workloads to maintain intensity without signs/symptoms of physical distress.      Resistance Training   Training Prescription  Yes    Weight  3 lbs    Reps  10-15       Perform Capillary Blood Glucose checks as needed.  Exercise Prescription Changes: Exercise Prescription Changes    Row Name 05/14/17 1500 05/31/17 1000 06/27/17 1500 07/11/17 1200 07/25/17 1200     Response to Exercise   Blood Pressure (Admit)  132/74  122/78  102/76  132/70  120/78   Blood Pressure (Exercise)  146/74  -  -  -  -   Blood Pressure (Exit)  134/70  106/68  132/70  114/60  134/70   Heart Rate (Admit)  78 bpm  92 bpm  88 bpm  89 bpm  82 bpm   Heart Rate (Exercise)  116 bpm  105 bpm  100 bpm  99 bpm  104 bpm   Heart Rate (Exit)  83 bpm  94 bpm  79 bpm  109 bpm  80 bpm   Oxygen Saturation (Admit)  92 %  88 %  88 %  89 %  91 %   Oxygen Saturation (Exercise)  79 %  90 %  90 %  89 %  83 % has a cold - inc to 90 with rest   Oxygen Saturation (Exit)  93 %  97 %  97 %  98 %  97 %   Rating of Perceived Exertion (Exercise)  '9  11  12  13  15   ' Perceived Dyspnea (Exercise)  2  1  0  2  3   Symptoms  fatigue  none  none  knee pain  none   Comments  walk test results  -  -  -  -   Duration  -  Continue with 45 min of aerobic exercise without signs/symptoms of physical distress.  Continue with 45 min of aerobic  exercise without signs/symptoms of physical distress.  Continue with 45 min of aerobic exercise without signs/symptoms of physical distress.  Continue with 45 min of aerobic exercise without signs/symptoms of physical distress.   Intensity  -  THRR unchanged  THRR unchanged  THRR unchanged knee pain prohibits exercise progression more than breathing  THRR unchanged     Progression   Progression  -  Continue to progress workloads to maintain intensity without signs/symptoms of physical distress.  Continue to progress workloads to maintain intensity without signs/symptoms of physical distress.  Continue to progress workloads to maintain intensity without signs/symptoms of physical distress.  Continue to progress workloads to maintain intensity without signs/symptoms of physical distress.   Average METs  -  2.2  1.8  -  2     Resistance Training   Training Prescription  -  Yes  Yes  Yes  Yes   Weight  -  4 lb  6 lb  6 lb  7 lb   Reps  -  10-15  10-15  10-15  10-15     Interval Training   Interval Training  -  -  -  -  No     Oxygen   Oxygen  -  Continuous  Continuous  Continuous  Continuous   Liters  -  '4  4  4  4      ' Treadmill   MPH  -  1.6  -  -  1.8   Grade  -  0.5  -  -  -   Minutes  -  15  -  -  15   METs  -  2.34  -  -  2.38     Recumbant Elliptical   Level  -  2  2  2.5  2.5   RPM  -  50  50  50  48   Minutes  -  '15  15  15  15   ' METs  -  2  1.7  2.3  1.6     T5 Nustep   Level  -  '2  5  5  ' -   SPM  -  80  80  80  -   Minutes  -  '15  15  15  ' -   METs  -  2  1.9  2.1  -     Home Exercise Plan   Plans to continue exercise at  -  -  [employer] (comment) [employer]  [employer] (comment) [place] (comment) [employer]   Frequency  -  -  Add 2 additional days to program exercise sessions.  Add 2 additional days to program exercise sessions.  Add 2 additional days to program exercise sessions.   Initial Home Exercises Provided  -  -   06/27/17  06/27/17  06/27/17      Exercise Comments: Exercise Comments    Row Name 05/21/17 1018           Exercise Comments  First full day of exercise!  Patient was oriented to gym and equipment including functions, settings, policies, and procedures.  Patient's individual exercise prescription and treatment plan were reviewed.  All starting workloads were established based on the results of the 6 minute walk test done at initial orientation visit.  The plan for exercise progression was also introduced and progression will be customized based on patient's performance and goals.          Exercise Goals and Review: Exercise Goals    Row Name 05/14/17 1554             Exercise Goals   Increase Physical Activity  Yes       Intervention  Provide advice, education, support and counseling about physical activity/exercise needs.;Develop an individualized exercise prescription for aerobic and resistive training based on initial evaluation findings, risk stratification, comorbidities and participant's personal goals.       Expected Outcomes  Achievement of increased cardiorespiratory fitness and enhanced flexibility, muscular endurance and strength shown through measurements of functional capacity and personal statement of participant.       Increase Strength and Stamina  Yes       Intervention  Provide advice, education, support and counseling about physical activity/exercise needs.;Develop an individualized exercise prescription for aerobic and resistive training based on initial evaluation findings, risk stratification, comorbidities and participant's personal goals.       Expected Outcomes  Achievement of increased cardiorespiratory fitness and enhanced flexibility, muscular endurance and strength shown through measurements of functional capacity and personal statement of participant.       Able to understand and use rate of perceived exertion (RPE) scale  Yes       Intervention  Provide  education and explanation on how to use RPE scale       Expected Outcomes  Short Term: Able to use RPE daily in rehab to express subjective intensity level;Long Term:  Able to use RPE to guide intensity level when exercising independently       Able to understand and use Dyspnea scale  Yes       Intervention  Provide education and explanation on how to use Dyspnea scale       Expected Outcomes  Short Term: Able to use Dyspnea scale daily in rehab to express subjective sense of shortness of breath during exertion;Long Term: Able to use Dyspnea scale to guide intensity level when exercising independently       Knowledge and understanding of Target Heart Rate Range (THRR)  Yes       Intervention  Provide education and explanation of THRR including how the numbers were predicted and where they are located for reference       Expected Outcomes  Short Term: Able to state/look up THRR;Long Term: Able to use THRR to govern intensity when exercising independently;Short Term: Able to use daily as guideline for intensity in rehab       Able to check pulse independently  Yes       Intervention  Provide education and demonstration on how to check pulse in carotid and radial arteries.;Review the importance of being able to check your own pulse for safety during independent exercise       Expected Outcomes  Short Term: Able to explain why pulse checking is important during independent exercise;Long Term: Able to check pulse independently and accurately       Understanding of Exercise Prescription  Yes       Intervention  Provide education, explanation, and written materials on patient's individual exercise prescription       Expected Outcomes  Short Term: Able to explain program exercise prescription;Long Term: Able to explain home exercise prescription to exercise independently          Exercise Goals Re-Evaluation : Exercise Goals Re-Evaluation    Row Name 05/21/17 1018 05/31/17 1043 06/27/17 1517 06/27/17  1518 07/02/17 1024     Exercise Goal Re-Evaluation   Exercise Goals Review  Understanding of Exercise Prescription;Able to understand and use Dyspnea scale;Knowledge and understanding of Target Heart Rate Range (THRR);Able to understand and use rate of perceived exertion (RPE) scale  Increase Physical Activity;Increase Strength and Stamina;Able to understand and use rate of perceived exertion (RPE) scale;Able to understand and use Dyspnea scale  Increase Physical Activity;Able to understand and use rate of perceived exertion (RPE) scale;Knowledge and understanding of Target Heart Rate Range (THRR);Increase Strength and Stamina;Able to understand and use Dyspnea scale  Increase Physical Activity;Able to understand and use rate of perceived exertion (RPE) scale;Knowledge and understanding of Target Heart Rate Range (THRR);Increase Strength and Stamina;Able to understand and use Dyspnea scale  Increase Physical Activity;Understanding of Exercise Prescription;Increase Strength and Stamina   Comments  Reviewed RPE scale, THR and program prescription with pt today.  Pt voiced understanding and was given a copy of goals to take home.   [name] is tolerating exercise well.  Staff will continue to monitor.  -  Reviewed home ex with PT. Plans to go to [facility] on T/Th whe not at [facility]  Safety and HR/RPE reviewed.  Nydia Bouton has been doing well in rehab.  His knee is one of his biggest limitations, he finds that he needs longer to warm up.  He has started to exercise some at home.  He is has been doing weights and walking.  He walked 7 min yesterday.   He is going to work on building up his time to 30 min a day.   Expected Outcomes  Short: Use RPE daily to regulate intensity.  Long: Follow program prescription in THR.  Short - Nydia Bouton will attend LW three times per week.  Long - [name] will improve MET level and overall fitness  -  Short - Pt will add 2 days to program session Long - Pt will maintain exercise on his own  Short: Add  1 min each day he goes to work up to 29mn for exercise.  Long: Continue to work on bOffice manager    [place]Name 07/11/17 1221 07/25/17 1222           Exercise Goal Re-Evaluation   Exercise Goals Review  Increase Physical Activity;Increase Strength and Stamina;Able to understand and use Dyspnea scale;Able to understand and use rate of perceived exertion (RPE) scale  Increase Physical Activity;Increase Strength and Stamina;Able to understand and use Dyspnea scale;Able to understand and use rate of perceived exertion (RPE) scale;Knowledge and understanding of Target Heart Rate Range (THRR)  Comments  [name]'s knee has been bothering him.  He is due for a shot soon.  Staff will montior progress  Nydia Bouton is progressing well and has increased to 7 lb strength training.  He has moved up speed on TM as well.  Staff will continue to monitor.      Expected Outcomes  Short - Nydia Bouton will continue to attend and do what he is able Long - Lanis knee will improve and he can progress workloads  Short - Nydia Bouton will continue to attend and exercise on other days Long - [name] will maintain fitness on his own         Discharge Exercise Prescription (Final Exercise Prescription Changes): Exercise Prescription Changes - 07/25/17 1200      Response to Exercise   Blood Pressure (Admit)  120/78    Blood Pressure (Exit)  134/70    Heart Rate (Admit)  82 bpm    Heart Rate (Exercise)  104 bpm    Heart Rate (Exit)  80 bpm    Oxygen Saturation (Admit)  91 %    Oxygen Saturation (Exercise)  83 % has a cold - inc to 90 with rest    Oxygen Saturation (Exit)  97 %    Rating of Perceived Exertion (Exercise)  15    Perceived Dyspnea (Exercise)  3    Symptoms  none    Duration  Continue with 45 min of aerobic exercise without signs/symptoms of physical distress.    Intensity  THRR unchanged      Progression   Progression  Continue to progress workloads to maintain intensity without signs/symptoms of physical  distress.    Average METs  2      Resistance Training   Training Prescription  Yes    Weight  7 lb    Reps  10-15      Interval Training   Interval Training  No      Oxygen   Oxygen  Continuous    Liters  4       Treadmill   MPH  1.8    Minutes  15    METs  2.38      Recumbant Elliptical   Level  2.5    RPM  48    Minutes  15    METs  1.6      Home Exercise Plan   Plans to continue exercise at  [employer] (comment) [place]    Frequency  Add 2 additional days to program exercise sessions.    Initial Home Exercises Provided  06/27/17       Nutrition:  Target Goals: Understanding of nutrition guidelines, daily intake of sodium <1559m, cholesterol <2028m calories 30% from fat and 7% or less from saturated fats, daily to have 5 or more servings of fruits and vegetables.  Biometrics: Pre Biometrics - 05/14/17 1557      Pre Biometrics   Height  5' 9.3" (1.76 m)    Weight  258 lb 14.4 oz (117.4 kg)    Waist Circumference  48.75 inches    Hip Circumference  43.5 inches    Waist to Hip Ratio  1.12 %    BMI (Calculated)  37.91        Nutrition Therapy Plan and Nutrition Goals: Nutrition Therapy & Goals - 05/25/17 1150      Nutrition Therapy   Diet  Diabetic/ TLC    Protein (specify units)  150g    Fiber  35 grams  Saturated Fats  16 max. grams    Fruits and Vegetables  6 servings/day    Sodium  2000 grams      Personal Nutrition Goals   Nutrition Goal  Pair sources of fiber and protein together with carbohydrates to help improve blood glucose control    Personal Goal #2  Practice portion control when snacking, particularly while watching TV    Personal Goal #3  Make pie and candy bars "sometimes" foods      Intervention Plan   Intervention  Prescribe, educate and counsel regarding individualized specific dietary modifications aiming towards targeted core components such as weight, hypertension, lipid management, diabetes, heart failure and  other comorbidities.;Nutrition handout(s) given to patient.    Expected Outcomes  Short Term Goal: Understand basic principles of dietary content, such as calories, fat, sodium, cholesterol and nutrients.;Short Term Goal: A plan has been developed with personal nutrition goals set during dietitian appointment.;Long Term Goal: Adherence to prescribed nutrition plan.       Nutrition Assessments:   Nutrition Goals Re-Evaluation: Nutrition Goals Re-Evaluation    [place] Name 07/02/17 1031 07/30/17 1042           Goals   Current Weight  250 lb (113.4 kg)  -      Nutrition Goal  Pair sources of fiber and protein together with carbohydrates to help improve blood glucose control, portion control, limit pie and candy bars.  -      Comment  He has been taking fiber pills to help increase fiber in his diet.  He is eating 5 smaller meals a day and watching portion control more.  He also has protein drinks available to boost protein but has been eating [place], chicken, and fish and beans.  He has tried to start to limit his candy bar.  Nydia Bouton is still eating small meals throughout the day.  He cant eat large meals and feels full quickly.        Expected Outcome  Short: Continue with 5 small meals a day.  Long; Continue to watch portion control.   Short - Nydia Bouton will continue to eat small meals  Long - [name] will manage his health with proper diet and exercise         Nutrition Goals Discharge (Final Nutrition Goals Re-Evaluation): Nutrition Goals Re-Evaluation - 07/30/17 1042      Goals   Comment  [name] is still eating small meals throughout the day.  He cant eat large meals and feels full quickly.      Expected Outcome  Short - [name] will continue to eat small meals  Long - [name] will manage his health with proper diet and exercise       Psychosocial: Target Goals: Acknowledge presence or absence of significant depression and/or stress, maximize coping skills, provide positive support system. Participant is  able to verbalize types and ability to use techniques and skills needed for reducing stress and depression.   Initial Review & Psychosocial Screening: Initial Psych Review & Screening - 05/14/17 1422      Initial Review   Current issues with  Current Stress Concerns    Source of Stress Concerns  Chronic Illness    Comments  Patients COPD is his main factor      Family Dynamics   Good Support System?  Yes    Comments  His girlfriend is good for his support.      Barriers   Psychosocial barriers to participate in program  The patient  should benefit from training in stress management and relaxation.      Screening Interventions   Interventions  Encouraged to exercise;Program counselor consult;To provide support and resources with identified psychosocial needs;Provide feedback about the scores to participant;Yes    Expected Outcomes  Long Term goal: The participant improves quality of Life and PHQ9 Scores as seen by post scores and/or verbalization of changes;Short Term goal: Identification and review with participant of any Quality of Life or Depression concerns found by scoring the questionnaire.;Long Term Goal: Stressors or current issues are controlled or eliminated.;Short Term goal: Utilizing psychosocial counselor, staff and physician to assist with identification of specific Stressors or current issues interfering with healing process. Setting desired goal for each stressor or current issue identified.       Quality of Life Scores:  Scores of 19 and below usually indicate a poorer quality of life in these areas.  A difference of  2-3 points is a clinically meaningful difference.  A difference of 2-3 points in the total score of the Quality of Life Index has been associated with significant improvement in overall quality of life, self-image, physical symptoms, and general health in studies assessing change in quality of life.  PHQ-9: Recent Review Flowsheet Data    Depression screen  [facility] 2/9 07/11/2017 06/18/2017 05/14/2017 04/04/2016 02/15/2016   Decreased Interest '1 2 2 2 ' 0   Down, Depressed, Hopeless 1 0 0 2 0   PHQ - 2 Score '2 2 2 4 ' 0   Altered sleeping 0 0 3 1 -   Tired, decreased energy '3 3 3 3 ' -   Change in appetite '2 2 2 1 ' -   Feeling bad or failure about yourself  2 0 3 0 -   Trouble concentrating '1 1 2 1 ' -   Moving slowly or fidgety/restless 0 0 0 0 -   Suicidal thoughts 0 0 0 0 -   PHQ-9 Score '10 8 15 10 ' -   Difficult doing work/chores Somewhat difficult Somewhat difficult Somewhat difficult Somewhat difficult -     Interpretation of Total Score  Total Score Depression Severity:  1-4 = Minimal depression, 5-9 = Mild depression, 10-14 = Moderate depression, 15-19 = Moderately severe depression, 20-27 = Severe depression   Psychosocial Evaluation and Intervention: Psychosocial Evaluation - 06/18/17 1050      Psychosocial Evaluation & Interventions   Interventions  Encouraged to exercise with the program and follow exercise prescription;Stress management education    Comments  Counselor met with "[name]" today as he has returned to this program after a year due to increased breathing difficulties.  He has a strong support system with lots of relatives close by and one of his daughters may be moving into his home in the next month or so.  [name] has significant knee problems and diabetes in addition to his pulmonary health issues.  He reports sleeping better and having less appetite recently.  He denies a history of depression or anxiety or any current symptoms.  Counselor reviewed with [name] the PHQ-9 scores or "15" indicating moderately severe for depression.  He reported and "8" today and this was put into EPIC stating his primary mood concerns involve his health as his primary stressor currently.  He also is struggling with some much needed home repairs.  Nydia Bouton has goals to breathe better and walk better while in this program.  Staff will follow with him throughout the  course of this program.      Expected Outcomes  [name] will benefit from consistent exercise to achieve his stated goals.  He will be followed by staff.      Continue Psychosocial Services   Follow up required by staff       Psychosocial Re-Evaluation: Psychosocial Re-Evaluation    [place] Name 07/02/17 1035 07/30/17 1044           Psychosocial Re-Evaluation   Current issues with  Current Sleep Concerns;Current Stress Concerns  None Identified      Comments  Nydia Bouton has been doing well in rehab.  He is struggling with not being able to do what he wants.  He would like to be able to go hard all out and not have to worry about his breathing.  Unforturnately, he has to slow down to work on his breathing.   He thinks that class is really helping him. His knee continues to limit his ability to exercise.  We talked about icing to help control the pain.  He has been doing well at home and maintains as best he can.  He continues to sleep well.  -      Expected Outcomes  Short: Continue to work on buidling strength and stamina  and weight loss.  Long: Continue to cope well with deconditioning.   -      Interventions  Stress management education;Encouraged to attend Pulmonary Rehabilitation for the exercise  Encouraged to attend Pulmonary Rehabilitation for the exercise      Continue Psychosocial Services   Follow up required by staff  Follow up required by staff      Comments  Patients COPD is his main factor  -        Initial Review   Source of Stress Concerns  Chronic Illness;Unable to participate in former interests or hobbies;Unable to perform yard/household activities  -         Psychosocial Discharge (Final Psychosocial Re-Evaluation): Psychosocial Re-Evaluation - 07/30/17 1044      Psychosocial Re-Evaluation   Current issues with  None Identified    Interventions  Encouraged to attend Pulmonary Rehabilitation for the exercise    Continue Psychosocial Services   Follow up required by staff        Education: Education Goals: Education classes will be provided on a weekly basis, covering required topics. Participant will state understanding/return demonstration of topics presented.  Learning Barriers/Preferences: Learning Barriers/Preferences - 05/14/17 1441      Learning Barriers/Preferences   Learning Barriers  None    Learning Preferences  None       Education Topics:  Initial Evaluation Education: - Verbal, written and demonstration of respiratory meds, oximetry and breathing techniques. Instruction on use of nebulizers and MDIs and importance of monitoring MDI activations.   Pulmonary Rehab from 07/30/2017 in [facility] Cardiac and Pulmonary Rehab  Date  05/14/17  Educator  [facility]  Instruction Review Code  1- Verbalizes Understanding      General Nutrition Guidelines/Fats and Fiber: -Group instruction provided by verbal, written material, models and posters to present the general guidelines for heart healthy nutrition. Gives an explanation and review of dietary fats and fiber.   Pulmonary Rehab from 07/30/2017 in [facility] Cardiac and Pulmonary Rehab  Date  07/23/17  Educator  CR  Instruction Review Code  1- Verbalizes Understanding      Controlling Sodium/Reading Food Labels: -Group verbal and written material supporting the discussion of sodium use in heart healthy nutrition. Review and explanation with models, verbal and written materials for utilization of the food  label.   Pulmonary Rehab from 07/30/2017 in [facility] Cardiac and Pulmonary Rehab  Date  07/30/17  Educator  CR  Instruction Review Code  1- Verbalizes Understanding      Exercise Physiology & General Exercise Guidelines: - Group verbal and written instruction with models to review the exercise physiology of the cardiovascular system and associated critical values. Provides general exercise guidelines with specific guidelines to those with heart or lung disease.    Pulmonary Rehab from 05/03/2016 in [facility] Cardiac and  Pulmonary Rehab  Date  05/03/16  Educator  [facility]  Instruction Review Code (retired)  2- meets goals/outcomes      Aerobic Exercise & Resistance Training: - Gives group verbal and written instruction on the various components of exercise. Focuses on aerobic and resistive training programs and the benefits of this training and how to safely progress through these programs.   Flexibility, Balance, Mind/Body Relaxation: Provides group verbal/written instruction on the benefits of flexibility and balance training, including mind/body exercise modes such as yoga, pilates and tai chi.  Demonstration and skill practice provided.   Pulmonary Rehab from 07/30/2017 in [facility] Cardiac and Pulmonary Rehab  Date  06/13/17  Educator  AS  Instruction Review Code  1- Verbalizes Understanding      Stress and Anxiety: - Provides group verbal and written instruction about the health risks of elevated stress and causes of high stress.  Discuss the correlation between heart/lung disease and anxiety and treatment options. Review healthy ways to manage with stress and anxiety.   Pulmonary Rehab from 07/30/2017 in [facility] Cardiac and Pulmonary Rehab  Date  07/04/17  Educator  [facility]  Instruction Review Code  1- Verbalizes Understanding      Depression: - Provides group verbal and written instruction on the correlation between heart/lung disease and depressed mood, treatment options, and the stigmas associated with seeking treatment.   Exercise & Equipment Safety: - Individual verbal instruction and demonstration of equipment use and safety with use of the equipment.   Pulmonary Rehab from 07/30/2017 in [facility] Cardiac and Pulmonary Rehab  Date  05/14/17  Educator  [facility]  Instruction Review Code  1- Verbalizes Understanding      Infection Prevention: - Provides verbal and written material to individual with discussion of infection control including proper hand washing and proper equipment cleaning during exercise session.    Pulmonary Rehab from 07/30/2017 in [facility] Cardiac and Pulmonary Rehab  Date  05/14/17  Educator  [facility]  Instruction Review Code  1- Verbalizes Understanding      Falls Prevention: - Provides verbal and written material to individual with discussion of falls prevention and safety.   Pulmonary Rehab from 07/30/2017 in [facility] Cardiac and Pulmonary Rehab  Date  05/14/17  Educator  [facility]  Instruction Review Code  1- Verbalizes Understanding      Diabetes: - Individual verbal and written instruction to review signs/symptoms of diabetes, desired ranges of glucose level fasting, after meals and with exercise. Advice that pre and post exercise glucose checks will be done for 3 sessions at entry of program.   Pulmonary Rehab from 05/03/2016 in [facility] Cardiac and Pulmonary Rehab  Date  04/04/16  Educator  [facility]  Instruction Review Code (retired)  2- meets goals/outcomes      Chronic Lung Diseases: - Group verbal and written instruction to review updates, respiratory medications, advancements in procedures and treatments. Discuss use of supplemental oxygen including available portable oxygen systems, continuous and intermittent flow rates, concentrators, personal use and safety guidelines.  Review proper use of inhaler and spacers. Provide informative websites for self-education.    Pulmonary Rehab from 07/30/2017 in [facility] Cardiac and Pulmonary Rehab  Date  07/25/17  Educator  [facility]  Instruction Review Code  1- Verbalizes Understanding      Energy Conservation: - Provide group verbal and written instruction for methods to conserve energy, plan and organize activities. Instruct on pacing techniques, use of adaptive equipment and posture/positioning to relieve shortness of breath.   Pulmonary Rehab from 07/30/2017 in [facility] Cardiac and Pulmonary Rehab  Date  06/27/17  Educator  [facility]  Instruction Review Code  1- Verbalizes Understanding      Triggers and Exacerbations: - Group verbal and written instruction to review  types of environmental triggers and ways to prevent exacerbations. Discuss weather changes, air quality and the benefits of nasal washing. Review warning signs and symptoms to help prevent infections. Discuss techniques for effective airway clearance, coughing, and vibrations.   Pulmonary Rehab from 05/03/2016 in [facility] Cardiac and Pulmonary Rehab  Date  04/26/16  Educator  LB  Instruction Review Code (retired)  2- meets goals/outcomes      AED/CPR: - Group verbal and written instruction with the use of models to demonstrate the basic use of the AED with the basic ABC's of resuscitation.   Pulmonary Rehab from 07/30/2017 in [facility] Cardiac and Pulmonary Rehab  Date  06/29/17  Educator  [facility]  Instruction Review Code  1- Actuary and Physiology of the Lungs: - Group verbal and written instruction with the use of models to provide basic lung anatomy and physiology related to function, structure and complications of lung disease.   Pulmonary Rehab from 07/30/2017 in [facility] Cardiac and Pulmonary Rehab  Date  07/11/17  Educator  [facility]  Instruction Review Code  1- Verbalizes Understanding      Anatomy & Physiology of the Heart: - Group verbal and written instruction and models provide basic cardiac anatomy and physiology, with the coronary electrical and arterial systems. Review of Valvular disease and Heart Failure   Cardiac Medications: - Group verbal and written instruction to review commonly prescribed medications for heart disease. Reviews the medication, class of the drug, and side effects.   Pulmonary Rehab from 07/30/2017 in [facility] Cardiac and Pulmonary Rehab  Date  06/15/17  Educator  [facility]  Instruction Review Code  1- Verbalizes Understanding      Know Your Numbers and Risk Factors: -Group verbal and written instruction about important numbers in your health.  Discussion of what are risk factors and how they play a role in the disease process.  Review of Cholesterol,  Blood Pressure, Diabetes, and BMI and the role they play in your overall health.   Sleep Hygiene: -Provides group verbal and written instruction about how sleep can affect your health.  Define sleep hygiene, discuss sleep cycles and impact of sleep habits. Review good sleep hygiene tips.    Other: -Provides group and verbal instruction on various topics (see comments)   Pulmonary Rehab from 07/30/2017 in [facility] Cardiac and Pulmonary Rehab  Date  05/23/17  Educator  [facility]  Instruction Review Code  1- Verbalizes Understanding [SLEEP]       Knowledge Questionnaire Score: Knowledge Questionnaire Score - 05/14/17 1424      Knowledge Questionnaire Score   Pre Score  14/18 reviewed with patient        Core Components/Risk Factors/Patient Goals at Admission: Personal Goals and Risk Factors at Admission - 05/14/17  1435      Core Components/Risk Factors/Patient Goals on Admission    Weight Management  Yes;Weight Loss    Intervention  Weight Management: Develop a combined nutrition and exercise program designed to reach desired caloric intake, while maintaining appropriate intake of nutrient and fiber, sodium and fats, and appropriate energy expenditure required for the weight goal.;Weight Management: Provide education and appropriate resources to help participant work on and attain dietary goals.    Admit Weight  258 lb 14.4 oz (117.4 kg)    Goal Weight: Short Term  253 lb (114.8 kg)    Goal Weight: Long Term  248 lb (112.5 kg)    Expected Outcomes  Short Term: Continue to assess and modify interventions until short term weight is achieved;Long Term: Adherence to nutrition and physical activity/exercise program aimed toward attainment of established weight goal;Understanding recommendations for meals to include 15-35% energy as protein, 25-35% energy from fat, 35-60% energy from carbohydrates, less than 289m of dietary cholesterol, 20-35 gm of total fiber daily;Understanding of distribution of  calorie intake throughout the day with the consumption of 4-5 meals/snacks;Weight Loss: Understanding of general recommendations for a balanced deficit meal plan, which promotes 1-2 lb weight loss per week and includes a negative energy balance of [phone] kcal/d    Improve shortness of breath with ADL's  Yes    Intervention  Provide education, individualized exercise plan and daily activity instruction to help decrease symptoms of SOB with activities of daily living.    Expected Outcomes  Short Term: Achieves a reduction of symptoms when performing activities of daily living.    Diabetes  Yes    Intervention  Provide education about signs/symptoms and action to take for hypo/hyperglycemia.;Provide education about proper nutrition, including hydration, and aerobic/resistive exercise prescription along with prescribed medications to achieve blood glucose in normal ranges: Fasting glucose 65-99 mg/dL    Expected Outcomes  Short Term: Participant verbalizes understanding of the signs/symptoms and immediate care of hyper/hypoglycemia, proper foot care and importance of medication, aerobic/resistive exercise and nutrition plan for blood glucose control.;Long Term: Attainment of HbA1C < 7%.    Heart Failure  Yes    Intervention  Provide a combined exercise and nutrition program that is supplemented with education, support and counseling about heart failure. Directed toward relieving symptoms such as shortness of breath, decreased exercise tolerance, and extremity edema.    Expected Outcomes  Improve functional capacity of life;Short term: Attendance in program 2-3 days a week with increased exercise capacity. Reported lower sodium intake. Reported increased fruit and vegetable intake. Reports medication compliance.;Short term: Daily weights obtained and reported for increase. Utilizing diuretic protocols set by physician.;Long term: Adoption of self-care skills and reduction of barriers for early signs and  symptoms recognition and intervention leading to self-care maintenance.    Hypertension  -- states he is not on blood pressure medication       Core Components/Risk Factors/Patient Goals Review:  Goals and Risk Factor Review    [place] Name 07/02/17 1026 07/30/17 1031           Core Components/Risk Factors/Patient Goals Review   Personal Goals Review  Weight Management/Obesity;Improve shortness of breath with ADL's;Heart Failure;Diabetes  Weight Management/Obesity;Improve shortness of breath with ADL's;Heart Failure;Hypertension      Review  LNydia Boutonis doing well in rehab. [facility] but his fluid is down overall with exercising routining.   His blood sugars have been lower overall and he has noticed that exercise is  helping.  He has not had symptoms of heart faillure recently and weighs daily.  He has reduced the amount of sodium that he is eating.  [name]'s SOB is getting better and no he is able to walk upstairs without panicing because of SOB.     Nydia Bouton is taking meds as directed.  He has not had any symptoms of CHF - fluid weight, SOB, etc.  .  He states he can breathe better overall.       Expected Outcomes  Short: Continue to work on weight loss with diet and exercise.  Long: Continue to manage heart failure symptoms.   Short - Nydia Bouton will finish LW program  Long - [name] will continue to practice breathing, exercise and healthy dietary habits         Core Components/Risk Factors/Patient Goals at Discharge (Final Review):  Goals and Risk Factor Review - 07/30/17 1031      Core Components/Risk Factors/Patient Goals Review   Personal Goals Review  Weight Management/Obesity;Improve shortness of breath with ADL's;Heart Failure;Hypertension    Review   [name] is taking meds as directed.  He has not had any symptoms of CHF - fluid weight, SOB, etc.  .  He states he can breathe better overall.     Expected Outcomes  Short - [name] will finish LW program  Long - [name] will continue to  practice breathing, exercise and healthy dietary habits       ITP Comments: ITP Comments    Row Name 05/14/17 1357 05/28/17 0936 06/08/17 0916 06/11/17 0857 07/09/17 0810   ITP Comments  Medical Evaluation completed. Chart sent for review and changes to Dr. Emily Filbert Director of [place]. Diagnosis can be found in [facility] encounter 05/14/16  [name] called to say that he is feeling sick and would not be in [place] this morning.  [name] called to say he went to the doctors on Monday. He had a high Co2. The doctor wants him to be on home oxygen at 2 liters continuous. He is going to advanced today to talk with them about home oxygen. He has been feeling tired and we informed him to come to rehab and exercise to get his energy back. He states that his car is in the shop and he will not be in class today but states he will be in Class next week.  Emrik has been sick to obtain his 30 days notes. ITP 30 day review sent to Dr. Emily Filbert director of [place] for reviews and changes.  ITP 30 day review sent to Dr. Emily Filbert Director of [place] for review and changes.   [place] Name 08/06/17 0818           ITP Comments   30 day review completed. ITP sent to Dr. Emily Filbert Director of [place]. Continue with ITP unless changes are made by physician          Comments: 30 day review

## 2017-08-08 DIAGNOSIS — J449 Chronic obstructive pulmonary disease, unspecified: Secondary | ICD-10-CM

## 2017-08-08 NOTE — Progress Notes (Signed)
Daily Session Note  Patient Details  Name: Tunis Gentle MRN: [id number] Date of Birth: 02/09/1946 Referring Provider:     Pulmonary Rehab from 05/14/2017 in [facility] Cardiac and Pulmonary Rehab  Referring Provider  Ezequiel Kayser MD      Encounter Date: 08/08/2017  Check In: Session Check In - 08/08/17 1030      Check-In   Location  [facility]-Cardiac & Pulmonary Rehab    Staff Present  Alberteen Sam, MA, [place], CCRP, Exercise Physiologist;Amanda Oletta Darter, BA, ACSM CEP, Exercise Physiologist;Joseph Flavia Shipper    Supervising physician immediately available to respond to emergencies  [name] immediately available ER MD    Physician(s)  Jimmye Norman and Clearnce Hasten    Medication changes reported      No    Fall or balance concerns reported     No    Warm-up and Cool-down  Performed on first and last piece of equipment    Resistance Training Performed  Yes    VAD Patient?  No      Pain Assessment   Currently in Pain?  No/denies    Multiple Pain Sites  No          Social History   Tobacco Use  Smoking Status Former Smoker  . Packs/day: 0.50  . Years: 1.00  . Pack years: 0.50  . Types: Cigarettes  . Last attempt to quit: 05/01/1969  . Years since quitting: 48.3  Smokeless Tobacco Never Used    Goals Met:  Proper associated with RPD/PD & O2 Sat Independence with exercise equipment Exercise tolerated well Strength training completed today  Goals Unmet:  Not Applicable  Comments: Pt able to follow exercise prescription today without complaint.  Will continue to monitor for progression.    Dr. Emily Filbert is Medical Director for [place] and [name].

## 2017-08-10 DIAGNOSIS — J449 Chronic obstructive pulmonary disease, unspecified: Secondary | ICD-10-CM

## 2017-08-10 NOTE — Progress Notes (Signed)
Daily Session Note  Patient Details  Name: Mario [name] MRN: [id number] Date of Birth: 1945/12/26 Referring Provider:     Pulmonary Rehab from 05/14/2017 in [facility] Cardiac and Pulmonary Rehab  Referring Provider  Ezequiel Kayser MD      Encounter Date: 08/10/2017  Check In: Session Check In - 08/10/17 0945      Check-In   Location  [facility]-Cardiac & Pulmonary Rehab    Staff Present  Justin Mend Lorre Nick, [place], RCEP, CCRP, Exercise Physiologist;Meredith Sherryll Burger, RN BSN    Supervising physician immediately available to respond to emergencies  [name] immediately available ER MD    Physician(s)  Dr. Alfred Levins and Pasduchowski    Medication changes reported      No    Fall or balance concerns reported     No    Tobacco Cessation  No Change    Warm-up and Cool-down  Performed as group-led instruction    Resistance Training Performed  Yes    VAD Patient?  No      Pain Assessment   Currently in Pain?  No/denies          Social History   Tobacco Use  Smoking Status Former Smoker  . Packs/day: 0.50  . Years: 1.00  . Pack years: 0.50  . Types: Cigarettes  . Last attempt to quit: 05/01/1969  . Years since quitting: 48.3  Smokeless Tobacco Never Used    Goals Met:  Independence with exercise equipment Exercise tolerated well No report of cardiac concerns or symptoms Strength training completed today  Goals Unmet:  Not Applicable  Comments: Pt able to follow exercise prescription today without complaint.  Will continue to monitor for progression.   Dr. Emily Filbert is Medical Director for [place] and [name].

## 2017-08-13 VITALS — Ht 69.3 in | Wt 252.0 lb

## 2017-08-13 DIAGNOSIS — J449 Chronic obstructive pulmonary disease, unspecified: Secondary | ICD-10-CM

## 2017-08-13 NOTE — Progress Notes (Signed)
Daily Session Note  Patient Details  Name: Mario [name] MRN: [id number] Date of Birth: Dec 28, 1945 Referring Provider:     Pulmonary Rehab from 05/14/2017 in [facility] Cardiac and Pulmonary Rehab  Referring Provider  Ezequiel Kayser MD      Encounter Date: 08/13/2017  Check In: Session Check In - 08/13/17 0943      Check-In   Location  [facility]-Cardiac & Pulmonary Rehab    Staff Present  Nada Maclachlan, BA, ACSM CEP, Exercise Physiologist;Kelly Amedeo Plenty, BS, ACSM CEP, Exercise Physiologist;Amiley Shishido Flavia Shipper    Supervising physician immediately available to respond to emergencies  [name] immediately available ER MD    Physician(s)  Dr. Burlene Arnt and Joni Fears    Medication changes reported      No    Fall or balance concerns reported     No    Tobacco Cessation  No Change    Warm-up and Cool-down  Performed as group-led instruction    Resistance Training Performed  Yes    VAD Patient?  No      Pain Assessment   Currently in Pain?  No/denies          Social History   Tobacco Use  Smoking Status Former Smoker  . Packs/day: 0.50  . Years: 1.00  . Pack years: 0.50  . Types: Cigarettes  . Last attempt to quit: 05/01/1969  . Years since quitting: 48.3  Smokeless Tobacco Never Used    Goals Met:  Independence with exercise equipment Exercise tolerated well No report of cardiac concerns or symptoms Strength training completed today  Goals Unmet:  Not Applicable  Comments:  [place] Name 05/14/17 1546 08/13/17 0958       6 Minute Walk   Phase  Initial  Discharge    Distance  630 feet  1125 feet    Distance % Change  -  78.6 %    Distance Feet Change  -  495 ft    Walk Time  3.8 minutes  6 minutes    # of Rest Breaks  1 test terminated at 3:48  0    MPH  1.88  2.13    METS  1.35  2.27    RPE  9  12    Perceived Dyspnea   2  1    VO2 Peak  4.74  7.94    Symptoms  Yes (comment)  Yes (comment)    Comments  fatigue  knee pain 6/10    Resting HR  78 bpm  81  bpm    Resting BP  132/74  136/74    Resting Oxygen Saturation   92 %  92 %    Exercise Oxygen Saturation  during 6 min walk  79 %  84 %    Max Ex. HR  116 bpm  109 bpm    Max Ex. BP  146/74  154/78    2 Minute Post BP  134/70  126/74      Interval HR   1 Minute HR  -  98    2 Minute HR  116  104    3 Minute HR  106  103    4 Minute HR  112 test terminated at 3:48  104    5 Minute HR  -  101    6 Minute HR  -  109    2 Minute Post HR  80  90    Interval Heart Rate?  Yes  Yes      Interval Oxygen   Interval Oxygen?  Yes  Yes    Baseline Oxygen Saturation %  92 %  92 %    1 Minute Oxygen Saturation %  84 %  91 %    1 Minute Liters of Oxygen  0 L Room Air  4 L    2 Minute Oxygen Saturation %  82 %  86 %    2 Minute Liters of Oxygen  0 L  4 L    3 Minute Oxygen Saturation %  81 % 80% 2:44  86 %    3 Minute Liters of Oxygen  0 L  4 L    4 Minute Oxygen Saturation %  79 % test terminated 3:48 and 78% 2mn  87 %    4 Minute Liters of Oxygen  0 L  4 L    5 Minute Oxygen Saturation %  85 %  86 % 5:46 84%    5 Minute Liters of Oxygen  0 L  4 L    6 Minute Oxygen Saturation %  -  86 % once seated 83%    6 Minute Liters of Oxygen  -  4 L    2 Minute Post Oxygen Saturation %  93 %  90 %    2 Minute Post Liters of Oxygen  83 L  4 L     Pt able to follow exercise prescription today without complaint.  Will continue to monitor for progression.   Dr. MEmily Filbertis Medical Director for [name].

## 2017-08-15 DIAGNOSIS — J449 Chronic obstructive pulmonary disease, unspecified: Secondary | ICD-10-CM | POA: Diagnosis not present

## 2017-08-15 NOTE — Progress Notes (Signed)
Pulmonary Individual Treatment Plan  Patient Details  Name: Mario [name] MRN: [id number] Date of Birth: [birth year] Referring Provider:     Pulmonary Rehab from 05/14/2017 in [facility] Cardiac and Pulmonary Rehab  Referring Provider  Ezequiel Kayser MD      Initial Encounter Date:    Pulmonary Rehab from 05/14/2017 in [facility] Cardiac and Pulmonary Rehab  Date  05/14/17  Referring Provider  Ezequiel Kayser MD      Visit Diagnosis: Chronic obstructive pulmonary disease, unspecified COPD type ([place])  Patient's Home Medications on Admission:  Current Outpatient Medications:  .  albuterol (PROVENTIL HFA;VENTOLIN HFA) 108 (90 Base) MCG/ACT inhaler, Inhale 2 puffs into the lungs as needed., Disp: , Rfl:  .  amLODipine (NORVASC) 5 MG tablet, Take 1 tablet by mouth daily., Disp: , Rfl:  .  diclofenac sodium (VOLTAREN) 1 % GEL, Apply 1 application topically 4 (four) times daily as needed., Disp: , Rfl:  .  edoxaban (SAVAYSA) 60 MG TABS tablet, Take 60 mg by mouth daily. cardiology, Disp: , Rfl:  .  fluticasone-salmeterol (ADVAIR HFA) 230-21 MCG/ACT inhaler, Inhale 2 puffs into the lungs 2 (two) times daily., Disp: , Rfl:  .  furosemide (LASIX) 80 MG tablet, Take 1 tablet by mouth daily., Disp: , Rfl:  .  gabapentin (NEURONTIN) 300 MG capsule, Take 1 capsule by mouth 3 (three) times daily., Disp: , Rfl:  .  glucose blood (ONE TOUCH ULTRA TEST) test strip, Use as instructed FOR TESTING three times daily.  E11.9, Disp: , Rfl:  .  HYDROcodone-acetaminophen (NORCO/VICODIN) 5-325 MG tablet, Take 1 tablet by mouth every 4 (four) hours as needed for moderate pain., Disp: 20 tablet, Rfl: 0 .  ipratropium-albuterol (DUONEB) 0.5-2.5 (3) MG/3ML SOLN, Inhale into the lungs., Disp: , Rfl:  .  losartan (COZAAR) 50 MG tablet, Take 1 tablet by mouth daily., Disp: , Rfl:  .  metFORMIN (GLUCOPHAGE) 1000 MG tablet, Take 1 tablet by mouth 2 (two) times daily., Disp: , Rfl:  .  sotalol (BETAPACE) 80 MG tablet, Take 1 tablet by  mouth 2 (two) times daily., Disp: , Rfl:  .  terazosin (HYTRIN) 5 MG capsule, Take 1 capsule by mouth every other day., Disp: , Rfl:  .  umeclidinium bromide (INCRUSE ELLIPTA) 62.5 MCG/INH AEPB, Inhale 1 puff into the lungs daily., Disp: , Rfl:   Past Medical History: Past Medical History:  Diagnosis Date  . COPD (chronic obstructive pulmonary disease) ([place])   . Diabetes mellitus without complication ([place])   . Diabetic neuropathy ([place])   . Hypertension     Tobacco Use: Social History   Tobacco Use  Smoking Status Former Smoker  . Packs/day: 0.50  . Years: 1.00  . Pack years: 0.50  . Types: Cigarettes  . Last attempt to quit: 05/01/1969  . Years since quitting: 48.3  Smokeless Tobacco Never Used    Labs: Recent Chemical engineer    Labs for ITP Cardiac and Pulmonary Rehab Latest Ref Rng & Units 02/15/2016   Hemoglobin A1c 4.8 - 5.6 % 6.8(H)       Pulmonary Assessment Scores: Pulmonary Assessment Scores    Row Name 05/14/17 1425 06/26/17 1505 07/11/17 1014     ADL [facility]   ADL Phase  Entry  Entry  Mid   SOB Score total  51  -  37   Rest  1  -  0   Walk  3  -  1   Stairs  3  -  4  Bath  1  -  1   Dress  2  -  1   Shop  3  -  1     CAT Score   CAT Score  18  -  -     mMRC Score   mMRC Score  -  2  -   Row Name 08/15/17 0932         ADL [facility]   ADL Phase  Exit     SOB Score total  24     Rest  0     Walk  1     Stairs  3     Bath  0     Dress  1     Shop  1       CAT Score   CAT Score  12       mMRC Score   mMRC Score  2        Pulmonary Function Assessment: Pulmonary Function Assessment - 05/14/17 1438      Initial Spirometry Results   FVC%  36 %    FEV1%  40 %    FEV1/FVC Ratio  76.26    Comments  best of two, good patient effort      Post Bronchodilator Spirometry Results   FVC%  47.16 %    FEV1%  46.8 %    FEV1/FVC Ratio  74.69    Comments  best of two, good patient effort      Breath   Bilateral Breath Sounds  Decreased;Clear     Shortness of Breath  Yes;Limiting activity       Exercise Target Goals:    Exercise Program Goal: Individual exercise prescription set using results from initial 6 min walk test and THRR while considering  patient's activity barriers and safety.    Exercise Prescription Goal: Initial exercise prescription builds to 30-45 minutes a day of aerobic activity, 2-3 days per week.  Home exercise guidelines will be given to patient during program as part of exercise prescription that the participant will acknowledge.  Activity Barriers & Risk Stratification: Activity Barriers & Cardiac Risk Stratification - 05/14/17 1550      Activity Barriers & Cardiac Risk Stratification   Activity Barriers  Arthritis;Joint Problems;Deconditioning;Muscular Weakness;Shortness of Breath;Balance Concerns bilateral knee pain, worse on left side       6 Minute Walk: 6 Minute Walk    Row Name 05/14/17 1546 08/13/17 0958       6 Minute Walk   Phase  Initial  Discharge    Distance  630 feet  1125 feet    Distance % Change  -  78.6 %    Distance Feet Change  -  495 ft    Walk Time  3.8 minutes  6 minutes    # of Rest Breaks  1 test terminated at 3:48  0    MPH  1.88  2.13    METS  1.35  2.27    RPE  9  12    Perceived Dyspnea   2  1    VO2 Peak  4.74  7.94    Symptoms  Yes (comment)  Yes (comment)    Comments  fatigue  knee pain 6/10    Resting HR  78 bpm  81 bpm    Resting BP  132/74  136/74    Resting Oxygen Saturation   92 %  92 %    Exercise Oxygen Saturation  during 6  min walk  79 %  84 %    Max Ex. HR  116 bpm  109 bpm    Max Ex. BP  146/74  154/78    2 Minute Post BP  134/70  126/74      Interval HR   1 Minute HR  -  98    2 Minute HR  116  104    3 Minute HR  106  103    4 Minute HR  112 test terminated at 3:48  104    5 Minute HR  -  101    6 Minute HR  -  109    2 Minute Post HR  80  90    Interval Heart Rate?  Yes  Yes      Interval Oxygen   Interval Oxygen?  Yes  Yes     Baseline Oxygen Saturation %  92 %  92 %    1 Minute Oxygen Saturation %  84 %  91 %    1 Minute Liters of Oxygen  0 L Room Air  4 L    2 Minute Oxygen Saturation %  82 %  86 %    2 Minute Liters of Oxygen  0 L  4 L    3 Minute Oxygen Saturation %  81 % 80% 2:44  86 %    3 Minute Liters of Oxygen  0 L  4 L    4 Minute Oxygen Saturation %  79 % test terminated 3:48 and 78% 57mn  87 %    4 Minute Liters of Oxygen  0 L  4 L    5 Minute Oxygen Saturation %  85 %  86 % 5:46 84%    5 Minute Liters of Oxygen  0 L  4 L    6 Minute Oxygen Saturation %  -  86 % once seated 83%    6 Minute Liters of Oxygen  -  4 L    2 Minute Post Oxygen Saturation %  93 %  90 %    2 Minute Post Liters of Oxygen  83 L  4 L      Oxygen Initial Assessment: Oxygen Initial Assessment - 05/14/17 1427      Home Oxygen   Home Oxygen Device  Portable Concentrator;E-Tanks    Sleep Oxygen Prescription  Continuous    Liters per minute  3.5    Home Exercise Oxygen Prescription  None    Liters per minute  3.5    Home at Rest Exercise Oxygen Prescription  None    Liters per minute  --    Compliance with Home Oxygen Use  Yes      Initial 6 min Walk   Oxygen Used  None      Program Oxygen Prescription   Program Oxygen Prescription  None      Intervention   Short Term Goals  To learn and demonstrate proper use of respiratory medications;To learn and understand importance of maintaining oxygen saturations>88%;To learn and exhibit compliance with exercise, home and travel O2 prescription;To learn and understand importance of monitoring SPO2 with pulse oximeter and demonstrate accurate use of the pulse oximeter.;To learn and demonstrate proper pursed lip breathing techniques or other breathing techniques.    Long  Term Goals  Exhibits compliance with exercise, home and travel O2 prescription;Verbalizes importance of monitoring SPO2 with pulse oximeter and return demonstration;Maintenance of O2 saturations>88%;Exhibits  proper breathing techniques, such as pursed  lip breathing or other method taught during program session;Compliance with respiratory medication;Demonstrates proper use of MDI's       Oxygen Re-Evaluation: Oxygen Re-Evaluation    Row Name 05/21/17 1019 07/02/17 1042 07/30/17 1058         Program Oxygen Prescription   Program Oxygen Prescription  Continuous  Continuous;E-Tanks  Continuous;E-Tanks     Liters per minute  '4  2  4     ' Comments  -  -  uses 6 on TM       Home Oxygen   Home Oxygen Device  Portable Concentrator;E-Tanks  Portable Concentrator;E-Tanks  Home Concentrator;E-Tanks     Sleep Oxygen Prescription  Continuous  Continuous  Continuous     Liters per minute  3.'5  2  3     ' Home Exercise Oxygen Prescription  Continuous  Continuous  Continuous     Liters per minute  3.'5  4  3     ' Home at Rest Exercise Oxygen Prescription  Continuous  Continuous  Continuous     Liters per minute  '4  4  3     ' Compliance with Home Oxygen Use  Yes  Yes  Yes       Goals/Expected Outcomes   Short Term Goals  To learn and demonstrate proper use of respiratory medications;To learn and understand importance of maintaining oxygen saturations>88%;To learn and exhibit compliance with exercise, home and travel O2 prescription;To learn and understand importance of monitoring SPO2 with pulse oximeter and demonstrate accurate use of the pulse oximeter.;To learn and demonstrate proper pursed lip breathing techniques or other breathing techniques.  To learn and demonstrate proper use of respiratory medications;To learn and understand importance of maintaining oxygen saturations>88%;To learn and exhibit compliance with exercise, home and travel O2 prescription;To learn and understand importance of monitoring SPO2 with pulse oximeter and demonstrate accurate use of the pulse oximeter.;To learn and demonstrate proper pursed lip breathing techniques or other breathing techniques.  To learn and exhibit compliance with  exercise, home and travel O2 prescription;To learn and understand importance of monitoring SPO2 with pulse oximeter and demonstrate accurate use of the pulse oximeter.;To learn and understand importance of maintaining oxygen saturations>88%;To learn and demonstrate proper pursed lip breathing techniques or other breathing techniques.;To learn and demonstrate proper use of respiratory medications     Long  Term Goals  Exhibits compliance with exercise, home and travel O2 prescription;Verbalizes importance of monitoring SPO2 with pulse oximeter and return demonstration;Maintenance of O2 saturations>88%;Exhibits proper breathing techniques, such as pursed lip breathing or other method taught during program session;Compliance with respiratory medication;Demonstrates proper use of MDI's  Exhibits compliance with exercise, home and travel O2 prescription;Verbalizes importance of monitoring SPO2 with pulse oximeter and return demonstration;Maintenance of O2 saturations>88%;Exhibits proper breathing techniques, such as pursed lip breathing or other method taught during program session;Compliance with respiratory medication;Demonstrates proper use of MDI's  Exhibits compliance with exercise, home and travel O2 prescription;Verbalizes importance of monitoring SPO2 with pulse oximeter and return demonstration;Maintenance of O2 saturations>88%;Exhibits proper breathing techniques, such as pursed lip breathing or other method taught during program session;Compliance with respiratory medication;Demonstrates proper use of MDI's;Other     Comments  Reviewed PLB technique with pt.  Talked about how it work and it's important to maintaining his exercise saturations.    Nydia Bouton has been complaint with his oxygen use.  He uses his pulse oximeters regularly as he has two.  He does not use his PLB all the time, but he will use  the PLB when he gets SOB.  He has been doing well with his inhaler and spacer.  He is good at maintaining his  saturations.   Reviewed medications with [name] - worked on PLB. Informed [name] how he should take his medication in a certain order to get the most benifit from his medications. He was slightly short of breath when he showed up for class today but his oxygen tubing looked to be kinked.     Goals/Expected Outcomes  Short: Become more profiecient at using PLB.   Long: Become independent at using PLB.  Short: Continue to use PLB more frequently during any activity.  Long: Continued compliance with oxygen therapy.   Short: take medications in the coreect order. Long: maintain medications independently        Oxygen Discharge (Final Oxygen Re-Evaluation): Oxygen Re-Evaluation - 07/30/17 1058      Program Oxygen Prescription   Program Oxygen Prescription  Continuous;E-Tanks    Liters per minute  4    Comments  uses 6 on TM      Home Oxygen   Home Oxygen Device  Home Concentrator;E-Tanks    Sleep Oxygen Prescription  Continuous    Liters per minute  3    Home Exercise Oxygen Prescription  Continuous    Liters per minute  3    Home at Rest Exercise Oxygen Prescription  Continuous    Liters per minute  3    Compliance with Home Oxygen Use  Yes      Goals/Expected Outcomes   Short Term Goals  To learn and exhibit compliance with exercise, home and travel O2 prescription;To learn and understand importance of monitoring SPO2 with pulse oximeter and demonstrate accurate use of the pulse oximeter.;To learn and understand importance of maintaining oxygen saturations>88%;To learn and demonstrate proper pursed lip breathing techniques or other breathing techniques.;To learn and demonstrate proper use of respiratory medications    Long  Term Goals  Exhibits compliance with exercise, home and travel O2 prescription;Verbalizes importance of monitoring SPO2 with pulse oximeter and return demonstration;Maintenance of O2 saturations>88%;Exhibits proper breathing techniques, such as pursed lip breathing or other  method taught during program session;Compliance with respiratory medication;Demonstrates proper use of MDI's;Other    Comments  Reviewed medications with [name] - worked on PLB. Informed [name] how he should take his medication in a certain order to get the most benifit from his medications. He was slightly short of breath when he showed up for class today but his oxygen tubing looked to be kinked.    Goals/Expected Outcomes  Short: take medications in the coreect order. Long: maintain medications independently       Initial Exercise Prescription: Initial Exercise Prescription - 05/14/17 1500      Date of Initial Exercise RX and Referring Provider   Date  05/14/17    Referring Provider  Ezequiel Kayser MD      Oxygen   Oxygen  Continuous    Liters  2      Treadmill   MPH  1.8    Grade  0    Minutes  15    METs  2.38      Recumbant Elliptical   Level  1    RPM  50    Minutes  15    METs  2      T5 Nustep   Level  1    SPM  80    Minutes  15    METs  2  Prescription Details   Frequency (times per week)  3    Duration  Progress to 45 minutes of aerobic exercise without signs/symptoms of physical distress      Intensity   THRR 40-80% of Max Heartrate  106-135    Ratings of Perceived Exertion  11-13    Perceived Dyspnea  0-4      Progression   Progression  Continue to progress workloads to maintain intensity without signs/symptoms of physical distress.      Resistance Training   Training Prescription  Yes    Weight  3 lbs    Reps  10-15       Perform Capillary Blood Glucose checks as needed.  Exercise Prescription Changes:  Exercise Prescription Changes    Row Name 05/14/17 1500 05/31/17 1000 06/27/17 1500 07/11/17 1200 07/25/17 1200     Response to Exercise   Blood Pressure (Admit)  132/74  122/78  102/76  132/70  120/78   Blood Pressure (Exercise)  146/74  -  -  -  -   Blood Pressure (Exit)  134/70  106/68  132/70  114/60  134/70   Heart Rate (Admit)  78 bpm   92 bpm  88 bpm  89 bpm  82 bpm   Heart Rate (Exercise)  116 bpm  105 bpm  100 bpm  99 bpm  104 bpm   Heart Rate (Exit)  83 bpm  94 bpm  79 bpm  109 bpm  80 bpm   Oxygen Saturation (Admit)  92 %  88 %  88 %  89 %  91 %   Oxygen Saturation (Exercise)  79 %  90 %  90 %  89 %  83 % has a cold - inc to 90 with rest   Oxygen Saturation (Exit)  93 %  97 %  97 %  98 %  97 %   Rating of Perceived Exertion (Exercise)  '9  11  12  13  15   ' Perceived Dyspnea (Exercise)  2  1  0  2  3   Symptoms  fatigue  none  none  knee pain  none   Comments  walk test results  -  -  -  -   Duration  -  Continue with 45 min of aerobic exercise without signs/symptoms of physical distress.  Continue with 45 min of aerobic exercise without signs/symptoms of physical distress.  Continue with 45 min of aerobic exercise without signs/symptoms of physical distress.  Continue with 45 min of aerobic exercise without signs/symptoms of physical distress.   Intensity  -  THRR unchanged  THRR unchanged  THRR unchanged knee pain prohibits exercise progression more than breathing  THRR unchanged     Progression   Progression  -  Continue to progress workloads to maintain intensity without signs/symptoms of physical distress.  Continue to progress workloads to maintain intensity without signs/symptoms of physical distress.  Continue to progress workloads to maintain intensity without signs/symptoms of physical distress.  Continue to progress workloads to maintain intensity without signs/symptoms of physical distress.   Average METs  -  2.2  1.8  -  2     Resistance Training   Training Prescription  -  Yes  Yes  Yes  Yes   Weight  -  4 lb  6 lb  6 lb  7 lb   Reps  -  10-15  10-15  10-15  10-15     Interval Training  Interval Training  -  -  -  -  No     Oxygen   Oxygen  -  Continuous  Continuous  Continuous  Continuous   Liters  -  '4  4  4  4      ' Treadmill   MPH  -  1.6  -  -  1.8   Grade  -  0.5  -  -  -   Minutes  -  15  -   -  15   METs  -  2.34  -  -  2.38     Recumbant Elliptical   Level  -  2  2  2.5  2.5   RPM  -  50  50  50  48   Minutes  -  '15  15  15  15   ' METs  -  2  1.7  2.3  1.6     T5 Nustep   Level  -  '2  5  5  ' -   SPM  -  80  80  80  -   Minutes  -  '15  15  15  ' -   METs  -  2  1.9  2.1  -     Home Exercise Plan   Plans to continue exercise at  -  -  [facility] (comment) [place] (comment) [place] (comment) [facility]   Frequency  -  -  Add 2 additional days to program exercise sessions.  Add 2 additional days to program exercise sessions.  Add 2 additional days to program exercise sessions.   Initial Home Exercises Provided  -  -  06/27/17  06/27/17  06/27/17   Row Name 08/08/17 1200             Response to Exercise   Blood Pressure (Admit)  130/74       Blood Pressure (Exit)  108/76       Heart Rate (Admit)  97 bpm       Heart Rate (Exercise)  99 bpm       Heart Rate (Exit)  74 bpm       Oxygen Saturation (Admit)  95 %       Oxygen Saturation (Exercise)  88 %       Oxygen Saturation (Exit)  97 %       Rating of Perceived Exertion (Exercise)  12       Perceived Dyspnea (Exercise)  1       Symptoms  none       Duration  Continue with 45 min of aerobic exercise without signs/symptoms of physical distress.       Intensity  THRR unchanged         Progression   Progression  Continue to progress workloads to maintain intensity without signs/symptoms of physical distress.       Average METs  1.9         Resistance Training   Training Prescription  Yes       Weight  7 lb       Reps  10-15         Interval Training   Interval Training  No         Oxygen   Oxygen  Continuous       Liters  4         Recumbant Elliptical   Level  2.5       RPM  48       Minutes  15       METs  1.7         T5 Nustep   Level  4       SPM  80       Minutes  15       METs  2.1         Home Exercise Plan   Plans to continue  exercise at  [facility] (comment) [facility]       Frequency  Add 2 additional days to program exercise sessions.       Initial Home Exercises Provided  06/27/17          Exercise Comments:  Exercise Comments    Row Name 05/21/17 1018 08/15/17 0932         Exercise Comments  First full day of exercise!  Patient was oriented to gym and equipment including functions, settings, policies, and procedures.  Patient's individual exercise prescription and treatment plan were reviewed.  All starting workloads were established based on the results of the 6 minute walk test done at initial orientation visit.  The plan for exercise progression was also introduced and progression will be customized based on patient's performance and goals.  Graeme graduated today from  rehab with 36 sessions completed.  Details of the patient's exercise prescription and what He needs to do in order to continue the prescription and progress were discussed with patient.  Patient was given a copy of prescription and goals.  Patient verbalized understanding.  Chaney plans to continue to exercise by joining the [employer].         Exercise Goals and Review:  Exercise Goals    Row Name 05/14/17 1554             Exercise Goals   Increase Physical Activity  Yes       Intervention  Provide advice, education, support and counseling about physical activity/exercise needs.;Develop an individualized exercise prescription for aerobic and resistive training based on initial evaluation findings, risk stratification, comorbidities and participant's personal goals.       Expected Outcomes  Achievement of increased cardiorespiratory fitness and enhanced flexibility, muscular endurance and strength shown through measurements of functional capacity and personal statement of participant.       Increase Strength and Stamina  Yes       Intervention  Provide advice, education, support and counseling about physical  activity/exercise needs.;Develop an individualized exercise prescription for aerobic and resistive training based on initial evaluation findings, risk stratification, comorbidities and participant's personal goals.       Expected Outcomes  Achievement of increased cardiorespiratory fitness and enhanced flexibility, muscular endurance and strength shown through measurements of functional capacity and personal statement of participant.       Able to understand and use rate of perceived exertion (RPE) scale  Yes       Intervention  Provide education and explanation on how to use RPE scale       Expected Outcomes  Short Term: Able to use RPE daily in rehab to express subjective intensity level;Long Term:  Able to use RPE to guide intensity level when exercising independently       Able to understand and use Dyspnea scale  Yes       Intervention  Provide education and explanation on how to use Dyspnea scale       Expected Outcomes  Short Term: Able to use Dyspnea scale daily in rehab to express subjective sense of shortness of breath during exertion;Long Term: Able to use Dyspnea scale to guide intensity level when exercising independently       Knowledge and understanding of Target Heart Rate Range (THRR)  Yes       Intervention  Provide education and explanation of THRR including how the numbers were predicted and where they are located for reference       Expected Outcomes  Short Term: Able to state/look up THRR;Long Term: Able to use THRR to govern intensity when exercising independently;Short Term: Able to use daily as guideline for intensity in rehab       Able to check pulse independently  Yes       Intervention  Provide education and demonstration on how to check pulse in carotid and radial arteries.;Review the importance of being able to check your own pulse for safety during independent exercise       Expected Outcomes  Short Term: Able to explain why pulse checking is important during independent  exercise;Long Term: Able to check pulse independently and accurately       Understanding of Exercise Prescription  Yes       Intervention  Provide education, explanation, and written materials on patient's individual exercise prescription       Expected Outcomes  Short Term: Able to explain program exercise prescription;Long Term: Able to explain home exercise prescription to exercise independently          Exercise Goals Re-Evaluation : Exercise Goals Re-Evaluation    Row Name 05/21/17 1018 05/31/17 1043 06/27/17 1517 06/27/17 1518 07/02/17 1024     Exercise Goal Re-Evaluation   Exercise Goals Review  Understanding of Exercise Prescription;Able to understand and use Dyspnea scale;Knowledge and understanding of Target Heart Rate Range (THRR);Able to understand and use rate of perceived exertion (RPE) scale  Increase Physical Activity;Increase Strength and Stamina;Able to understand and use rate of perceived exertion (RPE) scale;Able to understand and use Dyspnea scale  Increase Physical Activity;Able to understand and use rate of perceived exertion (RPE) scale;Knowledge and understanding of Target Heart Rate Range (THRR);Increase Strength and Stamina;Able to understand and use Dyspnea scale  Increase Physical Activity;Able to understand and use rate of perceived exertion (RPE) scale;Knowledge and understanding of Target Heart Rate Range (THRR);Increase Strength and Stamina;Able to understand and use Dyspnea scale  Increase Physical Activity;Understanding of Exercise Prescription;Increase Strength and Stamina   Comments  Reviewed RPE scale, THR and program prescription with pt today.  Pt voiced understanding and was given a copy of goals to take home.   [name] is tolerating exercise well.  Staff will continue to monitor.  -  Reviewed home ex with PT. Plans to go to [facility] on T/Th whe not at [facility].  Safety and HR/RPE reviewed.  Nydia Bouton has been doing well in rehab.  His knee is one of his biggest  limitations, he finds that he needs longer to warm up.  He has started to exercise some at home.  He is has been doing weights and walking.  He walked 7 min yesterday.   He is going to work on building up his time to 30 min a day.   Expected Outcomes  Short: Use RPE daily to regulate intensity.  Long: Follow program prescription in THR.  Short - Nydia Bouton will attend LW three times per week.  Long - [name] will improve MET level and overall fitness  -  Short -  Pt will add 2 days to program session Long - Pt will maintain exercise on his own  Short: Add 1 min each day he goes to work up to 55mn for exercise.  Long: Continue to work on bOffice manager    [place]Fire IslandName 07/11/17 1221 07/25/17 1222 08/08/17 1238         Exercise Goal Re-Evaluation   Exercise Goals Review  Increase Physical Activity;Increase Strength and Stamina;Able to understand and use Dyspnea scale;Able to understand and use rate of perceived exertion (RPE) scale  Increase Physical Activity;Increase Strength and Stamina;Able to understand and use Dyspnea scale;Able to understand and use rate of perceived exertion (RPE) scale;Knowledge and understanding of Target Heart Rate Range (THRR)  Increase Physical Activity;Increase Strength and Stamina;Able to understand and use Dyspnea scale;Able to understand and use rate of perceived exertion (RPE) scale     Comments  [name]'s knee has been bothering him.  He is due for a shot soon.  Staff will montior progress  LNydia Boutonis progressing well and has increased to 7 lb strength training.  He has moved up speed on TM as well.  Staff will continue to monitor.  [name] has increased his weight strength training to 7 lb.  Staff will monitor progress     Expected Outcomes  Short - [name] will continue to attend and do what he is able LBelleairknee will improve and he can progress workloads  Short - LNydia Boutonwill continue to attend and exercise on other days Long - [name] will maintain fitness on his own  Short -  [name] will complete LW program Long - [name] will maintain exercise on his own        Discharge Exercise Prescription (Final Exercise Prescription Changes): Exercise Prescription Changes - 08/08/17 1200      Response to Exercise   Blood Pressure (Admit)  130/74    Blood Pressure (Exit)  108/76    Heart Rate (Admit)  97 bpm    Heart Rate (Exercise)  99 bpm    Heart Rate (Exit)  74 bpm    Oxygen Saturation (Admit)  95 %    Oxygen Saturation (Exercise)  88 %    Oxygen Saturation (Exit)  97 %    Rating of Perceived Exertion (Exercise)  12    Perceived Dyspnea (Exercise)  1    Symptoms  none    Duration  Continue with 45 min of aerobic exercise without signs/symptoms of physical distress.    Intensity  THRR unchanged      Progression   Progression  Continue to progress workloads to maintain intensity without signs/symptoms of physical distress.    Average METs  1.9      Resistance Training   Training Prescription  Yes    Weight  7 lb    Reps  10-15      Interval Training   Interval Training  No      Oxygen   Oxygen  Continuous    Liters  4      Recumbant Elliptical   Level  2.5    RPM  48    Minutes  15    METs  1.7      T5 Nustep   Level  4    SPM  80    Minutes  15    METs  2.1      Home Exercise Plan   [facility] to [facility] [employer] [place]  Frequency  Add 2 additional days to program exercise sessions.    Initial Home Exercises Provided  06/27/17       Nutrition:  Target Goals: Understanding of nutrition guidelines, daily intake of sodium <1546m, cholesterol <2081m calories 30% from fat and 7% or less from saturated fats, daily to have 5 or more servings of fruits and vegetables.  Biometrics: Pre Biometrics - 05/14/17 1557      Pre Biometrics   Height  5' 9.3" (1.76 m)    Weight  258 lb 14.4 oz (117.4 kg)    Waist Circumference  48.75 inches    Hip Circumference  43.5 inches    Waist to Hip Ratio  1.12 %    BMI  (Calculated)  37.91      Post Biometrics - 08/13/17 1001       Post  Biometrics   Height  5' 9.3" (1.76 m)    Weight  252 lb (114.3 kg)    Waist Circumference  46 inches    Hip Circumference  39 inches    Waist to Hip Ratio  1.18 %    BMI (Calculated)  36.9       Nutrition Therapy Plan and Nutrition Goals: Nutrition Therapy & Goals - 05/25/17 1150      Nutrition Therapy   Diet  Diabetic/ TLC    Protein (specify units)  150g    Fiber  35 grams    Saturated Fats  16 max. grams    Fruits and Vegetables  6 servings/day    Sodium  2000 grams      Personal Nutrition Goals   Nutrition Goal  Pair sources of fiber and protein together with carbohydrates to help improve blood glucose control    Personal Goal #2  Practice portion control when snacking, particularly while watching TV    Personal Goal #3  Make pie and candy bars "sometimes" foods      Intervention Plan   Intervention  Prescribe, educate and counsel regarding individualized specific dietary modifications aiming towards targeted core components such as weight, hypertension, lipid management, diabetes, heart failure and other comorbidities.;Nutrition handout(s) given to patient.    Expected Outcomes  Short Term Goal: Understand basic principles of dietary content, such as calories, fat, sodium, cholesterol and nutrients.;Short Term Goal: A plan has been developed with personal nutrition goals set during dietitian appointment.;Long Term Goal: Adherence to prescribed nutrition plan.       Nutrition Assessments: Nutrition Assessments - 08/15/17 0934      MEDFICTS Scores   Post Score  50       Nutrition Goals Re-Evaluation: Nutrition Goals Re-Evaluation    Row Name 07/02/17 1031 07/30/17 1042           Goals   Current Weight  250 lb (113.4 kg)  -      Nutrition Goal  Pair sources of fiber and protein together with carbohydrates to help improve blood glucose control, portion control, limit pie and candy bars.  -       Comment  He has been taking fiber pills to help increase fiber in his diet.  He is eating 5 smaller meals a day and watching portion control more.  He also has protein drinks available to boost protein but has been eating tuKuwaitchicken, and fish and beans.  He has tried to start to limit his candy bar.  LaNydia Boutons still eating small meals throughout the day.  He cant eat large meals and feels full  quickly.        Expected Outcome  Short: Continue with 5 small meals a day.  Long; Continue to watch portion control.   Short - Nydia Bouton will continue to eat small meals  Long - [name] will manage his health with proper diet and exercise         Nutrition Goals Discharge (Final Nutrition Goals Re-Evaluation): Nutrition Goals Re-Evaluation - 07/30/17 1042      Goals   Comment  [name] is still eating small meals throughout the day.  He cant eat large meals and feels full quickly.      Expected Outcome  Short - [name] will continue to eat small meals  Long - [name] will manage his health with proper diet and exercise       Psychosocial: Target Goals: Acknowledge presence or absence of significant depression and/or stress, maximize coping skills, provide positive support system. Participant is able to verbalize types and ability to use techniques and skills needed for reducing stress and depression.   Initial Review & Psychosocial Screening: Initial Psych Review & Screening - 05/14/17 1422      Initial Review   Current issues with  Current Stress Concerns    Source of Stress Concerns  Chronic Illness    Comments  Patients COPD is his main factor      Family Dynamics   Good Support System?  Yes    Comments  His girlfriend is good for his support.      Barriers   Psychosocial barriers to participate in program  The patient should benefit from training in stress management and relaxation.      Screening Interventions   Interventions  Encouraged to exercise;Program counselor consult;To provide support and  resources with identified psychosocial needs;Provide feedback about the scores to participant;Yes    Expected Outcomes  Long Term goal: The participant improves quality of Life and PHQ9 Scores as seen by post scores and/or verbalization of changes;Short Term goal: Identification and review with participant of any Quality of Life or Depression concerns found by scoring the questionnaire.;Long Term Goal: Stressors or current issues are controlled or eliminated.;Short Term goal: Utilizing psychosocial counselor, staff and physician to assist with identification of specific Stressors or current issues interfering with healing process. Setting desired goal for each stressor or current issue identified.       Quality of Life Scores:  Scores of 19 and below usually indicate a poorer quality of life in these areas.  A difference of  2-3 points is a clinically meaningful difference.  A difference of 2-3 points in the total score of the Quality of Life Index has been associated with significant improvement in overall quality of life, self-image, physical symptoms, and general health in studies assessing change in quality of life.  PHQ-9: Recent Review Flowsheet Data    Depression screen [facility] 2/9 08/15/2017 07/11/2017 06/18/2017 05/14/2017 04/04/2016   Decreased Interest '1 1 2 2 2   ' Down, Depressed, Hopeless 0 1 0 0 2   PHQ - 2 Score '1 2 2 2 4   ' Altered sleeping 0 0 0 3 1   Tired, decreased energy '2 3 3 3 3   ' Change in appetite 0 '2 2 2 1   ' Feeling bad or failure about yourself  0 2 0 3 0   Trouble concentrating 0 '1 1 2 1   ' Moving slowly or fidgety/restless 0 0 0 0 0   Suicidal thoughts 0 0 0 0 0   PHQ-9  Score '3 10 8 15 10   ' Difficult doing work/chores Not difficult at all Somewhat difficult Somewhat difficult Somewhat difficult Somewhat difficult     Interpretation of Total Score  Total Score Depression Severity:  1-4 = Minimal depression, 5-9 = Mild depression, 10-14 = Moderate depression, 15-19 =  Moderately severe depression, 20-27 = Severe depression   Psychosocial Evaluation and Intervention: Psychosocial Evaluation - 08/13/17 1046      Discharge Psychosocial Assessment & Intervention   Comments  Discharge evaluation with [name] today.  He reports this program "saves me" as he has enjoyed the exercise; the social aspects; and the educational pieces.  Nydia Bouton has decided to continue exercise at [facility] in [place]  so he can have access to oxygen tanks.  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  He is seeing specialists for these concerns in the next month.  Counselor commended [name] for his progress made and hard work while in this class.  Also for being proactive about his needs for consistency beyond this program.         Psychosocial Re-Evaluation: Psychosocial Re-Evaluation    [place] Name 07/02/17 1035 07/30/17 1044           Psychosocial Re-Evaluation   Current issues with  Current Sleep Concerns;Current Stress Concerns  None Identified      Comments  Nydia Bouton has been doing well in rehab.  He is struggling with not being able to do what he wants.  He would like to be able to go hard all out and not have to worry about his breathing.  Unforturnately, he has to slow down to work on his breathing.   He thinks that class is really helping him. His knee continues to limit his ability to exercise.  We talked about icing to help control the pain.  He has been doing well at home and maintains as best he can.  He continues to sleep well.  -      Expected Outcomes  Short: Continue to work on buidling strength and stamina  and weight loss.  Long: Continue to cope well with deconditioning.   -      Interventions  Stress management education;Encouraged to attend Pulmonary Rehabilitation for the exercise  Encouraged to attend Pulmonary Rehabilitation for the exercise      Continue Psychosocial Services   Follow up required by staff  Follow  up required by staff      Comments  Patients COPD is his main factor  -        Initial Review   Source of Stress Concerns  Chronic Illness;Unable to participate in former interests or hobbies;Unable to perform yard/household activities  -         Psychosocial Discharge (Final Psychosocial Re-Evaluation): Psychosocial Re-Evaluation - 07/30/17 1044      Psychosocial Re-Evaluation   Current issues with  None Identified    Interventions  Encouraged to attend Pulmonary Rehabilitation for the exercise    Continue Psychosocial Services   Follow up required by staff       Education: Education Goals: Education classes will be provided on a weekly basis, covering required topics. Participant will state understanding/return demonstration of topics presented.  Learning Barriers/Preferences: Learning Barriers/Preferences - 05/14/17 1441      Learning Barriers/Preferences   Learning Barriers  None    Learning Preferences  None       Education Topics:  Initial  Evaluation Education: - Verbal, written and demonstration of respiratory meds, oximetry and breathing techniques. Instruction on use of nebulizers and MDIs and importance of monitoring MDI activations.   Pulmonary Rehab from 08/15/2017 in [facility] Cardiac and Pulmonary Rehab  Date  05/14/17  Educator  [facility]  Instruction Review Code  1- Verbalizes Understanding      General Nutrition Guidelines/Fats and Fiber: -Group instruction provided by verbal, written material, models and posters to present the general guidelines for heart healthy nutrition. Gives an explanation and review of dietary fats and fiber.   Pulmonary Rehab from 08/15/2017 in [facility] Cardiac and Pulmonary Rehab  Date  07/23/17  Educator  CR  Instruction Review Code  1- Verbalizes Understanding      Controlling Sodium/Reading Food Labels: -Group verbal and written material supporting the discussion of sodium use in heart healthy nutrition. Review and explanation with  models, verbal and written materials for utilization of the food label.   Pulmonary Rehab from 08/15/2017 in [facility] Cardiac and Pulmonary Rehab  Date  07/30/17  Educator  CR  Instruction Review Code  1- Verbalizes Understanding      Exercise Physiology & General Exercise Guidelines: - Group verbal and written instruction with models to review the exercise physiology of the cardiovascular system and associated critical values. Provides general exercise guidelines with specific guidelines to those with heart or lung disease.    Pulmonary Rehab from 08/15/2017 in [facility] Cardiac and Pulmonary Rehab  Date  08/08/17  Educator  [facility]  Instruction Review Code  1- Verbalizes Understanding      Aerobic Exercise & Resistance Training: - Gives group verbal and written instruction on the various components of exercise. Focuses on aerobic and resistive training programs and the benefits of this training and how to safely progress through these programs.   Flexibility, Balance, Mind/Body Relaxation: Provides group verbal/written instruction on the benefits of flexibility and balance training, including mind/body exercise modes such as yoga, pilates and tai chi.  Demonstration and skill practice provided.   Pulmonary Rehab from 08/15/2017 in [facility] Cardiac and Pulmonary Rehab  Date  06/13/17  Educator  AS  Instruction Review Code  1- Verbalizes Understanding      Stress and Anxiety: - Provides group verbal and written instruction about the health risks of elevated stress and causes of high stress.  Discuss the correlation between heart/lung disease and anxiety and treatment options. Review healthy ways to manage with stress and anxiety.   Pulmonary Rehab from 08/15/2017 in [facility] Cardiac and Pulmonary Rehab  Date  07/04/17  Educator  [facility]  Instruction Review Code  1- Verbalizes Understanding      Depression: - Provides group verbal and written instruction on the correlation between heart/lung disease and  depressed mood, treatment options, and the stigmas associated with seeking treatment.   Exercise & Equipment Safety: - Individual verbal instruction and demonstration of equipment use and safety with use of the equipment.   Pulmonary Rehab from 08/15/2017 in [facility] Cardiac and Pulmonary Rehab  Date  05/14/17  Educator  [facility]  Instruction Review Code  1- Verbalizes Understanding      Infection Prevention: - Provides verbal and written material to individual with discussion of infection control including proper hand washing and proper equipment cleaning during exercise session.   Pulmonary Rehab from 08/15/2017 in [facility] Cardiac and Pulmonary Rehab  Date  05/14/17  Educator  [facility]  Instruction Review Code  1- Verbalizes Understanding      Falls Prevention: - Provides verbal and  written material to individual with discussion of falls prevention and safety.   Pulmonary Rehab from 08/15/2017 in [facility] Cardiac and Pulmonary Rehab  Date  05/14/17  Educator  [facility]  Instruction Review Code  1- Verbalizes Understanding      Diabetes: - Individual verbal and written instruction to review signs/symptoms of diabetes, desired ranges of glucose level fasting, after meals and with exercise. Advice that pre and post exercise glucose checks will be done for 3 sessions at entry of program.   Pulmonary Rehab from 05/03/2016 in [facility] Cardiac and Pulmonary Rehab  Date  04/04/16  Educator  LB  Instruction Review Code (retired)  2- meets goals/outcomes      Chronic Lung Diseases: - Group verbal and written instruction to review updates, respiratory medications, advancements in procedures and treatments. Discuss use of supplemental oxygen including available portable oxygen systems, continuous and intermittent flow rates, concentrators, personal use and safety guidelines. Review proper use of inhaler and spacers. Provide informative websites for self-education.    Pulmonary Rehab from 08/15/2017 in [facility] Cardiac and  Pulmonary Rehab  Date  07/25/17  Educator  [facility]  Instruction Review Code  1- Verbalizes Understanding      Energy Conservation: - Provide group verbal and written instruction for methods to conserve energy, plan and organize activities. Instruct on pacing techniques, use of adaptive equipment and posture/positioning to relieve shortness of breath.   Pulmonary Rehab from 08/15/2017 in [facility] Cardiac and Pulmonary Rehab  Date  06/27/17  Educator  [facility]  Instruction Review Code  1- Verbalizes Understanding      Triggers and Exacerbations: - Group verbal and written instruction to review types of environmental triggers and ways to prevent exacerbations. Discuss weather changes, air quality and the benefits of nasal washing. Review warning signs and symptoms to help prevent infections. Discuss techniques for effective airway clearance, coughing, and vibrations.   Pulmonary Rehab from 05/03/2016 in [facility] Cardiac and Pulmonary Rehab  Date  04/26/16  Educator  LB  Instruction Review Code (retired)  2- meets goals/outcomes      AED/CPR: - Group verbal and written instruction with the use of models to demonstrate the basic use of the AED with the basic ABC's of resuscitation.   Pulmonary Rehab from 08/15/2017 in [facility] Cardiac and Pulmonary Rehab  Date  06/29/17  Educator  [facility]  Instruction Review Code  1- Actuary and Physiology of the Lungs: - Group verbal and written instruction with the use of models to provide basic lung anatomy and physiology related to function, structure and complications of lung disease.   Pulmonary Rehab from 08/15/2017 in [facility] Cardiac and Pulmonary Rehab  Date  07/11/17  Educator  [facility]  Instruction Review Code  1- Verbalizes Understanding      Anatomy & Physiology of the Heart: - Group verbal and written instruction and models provide basic cardiac anatomy and physiology, with the coronary electrical and arterial systems. Review of Valvular  disease and Heart Failure   Cardiac Medications: - Group verbal and written instruction to review commonly prescribed medications for heart disease. Reviews the medication, class of the drug, and side effects.   Pulmonary Rehab from 08/15/2017 in [facility] Cardiac and Pulmonary Rehab  Date  06/15/17  Educator  [facility]  Instruction Review Code  1- Verbalizes Understanding      Know Your Numbers and Risk Factors: -Group verbal and written instruction about important numbers in your health.  Discussion of what are risk factors  and how they play a role in the disease process.  Review of Cholesterol, Blood Pressure, Diabetes, and BMI and the role they play in your overall health.   Pulmonary Rehab from 08/15/2017 in [facility] Cardiac and Pulmonary Rehab  Date  08/10/17  Educator  [facility]  Instruction Review Code  1- Verbalizes Understanding      Sleep Hygiene: -Provides group verbal and written instruction about how sleep can affect your health.  Define sleep hygiene, discuss sleep cycles and impact of sleep habits. Review good sleep hygiene tips.    Pulmonary Rehab from 08/15/2017 in [facility] Cardiac and Pulmonary Rehab  Date  08/15/17  Educator  [facility]  Instruction Review Code  1- Verbalizes Understanding      Other: -Provides group and verbal instruction on various topics (see comments)   Pulmonary Rehab from 08/15/2017 in [facility] Cardiac and Pulmonary Rehab  Date  05/23/17  Educator  [facility]  Instruction Review Code  1- Verbalizes Understanding [SLEEP]       Knowledge Questionnaire Score: Knowledge Questionnaire Score - 08/15/17 0934      Knowledge Questionnaire Score   Pre Score  14/18    Post Score  14/18 reviewed with patient        Core Components/Risk Factors/Patient Goals at Admission: Personal Goals and Risk Factors at Admission - 05/14/17 1435      Core Components/Risk Factors/Patient Goals on Admission    Weight Management  Yes;Weight Loss    Intervention  Weight Management: Develop a combined  nutrition and exercise program designed to reach desired caloric intake, while maintaining appropriate intake of nutrient and fiber, sodium and fats, and appropriate energy expenditure required for the weight goal.;Weight Management: Provide education and appropriate resources to help participant work on and attain dietary goals.    Admit Weight  258 lb 14.4 oz (117.4 kg)    Goal Weight: Short Term  253 lb (114.8 kg)    Goal Weight: Long Term  248 lb (112.5 kg)    Expected Outcomes  Short Term: Continue to assess and modify interventions until short term weight is achieved;Long Term: Adherence to nutrition and physical activity/exercise program aimed toward attainment of established weight goal;Understanding recommendations for meals to include 15-35% energy as protein, 25-35% energy from fat, 35-60% energy from carbohydrates, less than 253m of dietary cholesterol, 20-35 gm of total fiber daily;Understanding of distribution of calorie intake throughout the day with the consumption of 4-5 meals/snacks;Weight Loss: Understanding of general recommendations for a balanced deficit meal plan, which promotes 1-2 lb weight loss per week and includes a negative energy balance of [phone] kcal/d    Improve shortness of breath with ADL's  Yes    Intervention  Provide education, individualized exercise plan and daily activity instruction to help decrease symptoms of SOB with activities of daily living.    Expected Outcomes  Short Term: Achieves a reduction of symptoms when performing activities of daily living.    Diabetes  Yes    Intervention  Provide education about signs/symptoms and action to take for hypo/hyperglycemia.;Provide education about proper nutrition, including hydration, and aerobic/resistive exercise prescription along with prescribed medications to achieve blood glucose in normal ranges: Fasting glucose 65-99 mg/dL    Expected Outcomes  Short Term: Participant verbalizes understanding of the  signs/symptoms and immediate care of hyper/hypoglycemia, proper foot care and importance of medication, aerobic/resistive exercise and nutrition plan for blood glucose control.;Long Term: Attainment of HbA1C < 7%.    Heart Failure  Yes    Intervention  Provide a combined exercise and nutrition program that is supplemented with education, support and counseling about heart failure. Directed toward relieving symptoms such as shortness of breath, decreased exercise tolerance, and extremity edema.    Expected Outcomes  Improve functional capacity of life;Short term: Attendance in program 2-3 days a week with increased exercise capacity. Reported lower sodium intake. Reported increased fruit and vegetable intake. Reports medication compliance.;Short term: Daily weights obtained and reported for increase. Utilizing diuretic protocols set by physician.;Long term: Adoption of self-care skills and reduction of barriers for early signs and symptoms recognition and intervention leading to self-care maintenance.    Hypertension  -- states he is not on blood pressure medication       Core Components/Risk Factors/Patient Goals Review:  Goals and Risk Factor Review    Row Name 07/02/17 1026 07/30/17 1031           Core Components/Risk Factors/Patient Goals Review   Personal Goals Review  Weight Management/Obesity;Improve shortness of breath with ADL's;Heart Failure;Diabetes  Weight Management/Obesity;Improve shortness of breath with ADL's;Heart Failure;Hypertension      Review  Nydia Bouton is doing well in rehab. He continues to [facility] with his weight, but his fluid is down overall with exercising routining.   His blood sugars have been lower overall and he has noticed that exercise is helping.  He has not had symptoms of heart faillure recently and weighs daily.  He has reduced the amount of sodium that he is eating.  [name]'s SOB is getting better and no he is able to walk upstairs without panicing because of SOB.     Nydia Bouton  is taking meds as directed.  He has not had any symptoms of CHF - fluid weight, SOB, etc.  .  He states he can breathe better overall.       Expected Outcomes  Short: Continue to work on weight loss with diet and exercise.  Long: Continue to manage heart failure symptoms.   Short - Nydia Bouton will finish LW program  Long - [name] will continue to practice breathing, exercise and healthy dietary habits         Core Components/Risk Factors/Patient Goals at Discharge (Final Review):  Goals and Risk Factor Review - 07/30/17 1031      Core Components/Risk Factors/Patient Goals Review   Personal Goals Review  Weight Management/Obesity;Improve shortness of breath with ADL's;Heart Failure;Hypertension    Review   [name] is taking meds as directed.  He has not had any symptoms of CHF - fluid weight, SOB, etc.  .  He states he can breathe better overall.     Expected Outcomes  Short - [name] will finish LW program  Long - [name] will continue to practice breathing, exercise and healthy dietary habits       ITP Comments: ITP Comments    Row Name 05/14/17 1357 05/28/17 0936 06/08/17 0916 06/11/17 0857 07/09/17 0810   ITP Comments  Medical Evaluation completed. Chart sent for review and changes to Dr. Emily Filbert Director of [place]. Diagnosis can be found in [facility] encounter 05/14/16  [name] called to say that he is feeling sick and would not be in [place] this morning.  [name] called to say he went to the doctors on Monday. He had a high Co2. The doctor wants him to be on home oxygen at 2 liters continuous. He is going to advanced today to talk with them about home oxygen. He has been feeling tired and we informed him to come to rehab and  exercise to get his energy back. He states that his car is in the shop and he will not be in class today but states he will be in Class next week.  Devell has been sick to obtain his 30 days notes. ITP 30 day review sent to Dr. Emily Filbert director of [facility] for reviews and changes.  ITP  30 day review sent to Dr. Emily Filbert Director of [place] for review and changes.   [place] Name 08/06/17 0818 08/15/17 0941         ITP Comments   30 day review completed. ITP sent to Dr. Emily Filbert Director of [place]. Continue with ITP unless changes are made by physician  Discharge ITP sent and signed by Dr. Sabra Heck.  Discharge Summary routed to PCP and Pulmonologist.         Comments: Discharge ITP

## 2017-08-15 NOTE — Progress Notes (Signed)
Discharge Progress Report  Patient Details  Name: Mario [name] MRN: [id number] Date of Birth: 12/27/1945 Referring Provider:     Pulmonary Rehab from 05/14/2017 in [facility]  Referring Provider  Ezequiel Kayser MD       Number of Visits: 36/36  Reason for Discharge:  Patient reached a stable level of exercise. Patient independent in their exercise. Patient has met program and personal goals.  Smoking History:  Social History   Tobacco Use  Smoking Status Former Smoker  . Packs/day: 0.50  . Years: 1.00  . Pack years: 0.50  . Types: Cigarettes  . Last attempt to quit: 05/01/1969  . Years since quitting: 48.3  Smokeless Tobacco Never Used    Diagnosis:  Chronic obstructive pulmonary disease, unspecified COPD type ([place])  ADL UCSD: Pulmonary Assessment Scores    Row Name 05/14/17 1425 06/26/17 1505 07/11/17 1014     ADL UCSD   ADL Phase  Entry  Entry  Mid   SOB Score total  51  -  37   Rest  1  -  0   Walk  3  -  1   Stairs  3  -  4   Bath  1  -  1   Dress  2  -  1   Shop  3  -  1     CAT Score   CAT Score  18  -  -     mMRC Score   mMRC Score  -  2  -   Row Name 08/15/17 0932         ADL UCSD   ADL Phase  Exit     SOB Score total  24     Rest  0     Walk  1     Stairs  3     Bath  0     Dress  1     Shop  1       CAT Score   CAT Score  12        Initial Exercise Prescription: Initial Exercise Prescription - 05/14/17 1500      Date of Initial Exercise RX and Referring Provider   Date  05/14/17    Referring Provider  Ezequiel Kayser MD      Oxygen   Oxygen  Continuous    Liters  2      Treadmill   MPH  1.8    Grade  0    Minutes  15    METs  2.38      Recumbant Elliptical   Level  1    RPM  50    Minutes  15    METs  2      T5 Nustep   Level  1    SPM  80    Minutes  15    METs  2      Prescription Details   Frequency (times per week)  3    Duration  Progress to 45 minutes of aerobic exercise without  signs/symptoms of physical distress      Intensity   THRR 40-80% of Max Heartrate  106-135    Ratings of Perceived Exertion  11-13    Perceived Dyspnea  0-4      Progression   Progression  Continue to progress workloads to maintain intensity without signs/symptoms of physical distress.      Horticulturist, commercial Prescription  Yes    Weight  3 lbs    Reps  10-15       Discharge Exercise Prescription (Final Exercise Prescription Changes): Exercise Prescription Changes - 08/08/17 1200      Response to Exercise   Blood Pressure (Admit)  130/74    Blood Pressure (Exit)  108/76    Heart Rate (Admit)  97 bpm    Heart Rate (Exercise)  99 bpm    Heart Rate (Exit)  74 bpm    Oxygen Saturation (Admit)  95 %    Oxygen Saturation (Exercise)  88 %    Oxygen Saturation (Exit)  97 %    Rating of Perceived Exertion (Exercise)  12    Perceived Dyspnea (Exercise)  1    Symptoms  none    Duration  Continue with 45 min of aerobic exercise without signs/symptoms of physical distress.    Intensity  THRR unchanged      Progression   Progression  Continue to progress workloads to maintain intensity without signs/symptoms of physical distress.    Average METs  1.9      Resistance Training   Training Prescription  Yes    Weight  7 lb    Reps  10-15      Interval Training   Interval Training  No      Oxygen   Oxygen  Continuous    Liters  4      Recumbant Elliptical   Level  2.5    RPM  48    Minutes  15    METs  1.7      T5 Nustep   Level  4    SPM  80    Minutes  15    METs  2.1      Home Exercise Plan   Plans to continue exercise at  [employer] (comment) [place]    Frequency  Add 2 additional days to program exercise sessions.    Initial Home Exercises Provided  06/27/17       Functional Capacity: 6 Minute Walk    Row Name 05/14/17 1546 08/13/17 0958       6 Minute Walk   Phase  Initial  Discharge    Distance  630 feet  1125 feet    Distance  % Change  -  78.6 %    Distance Feet Change  -  495 ft    Walk Time  3.8 minutes  6 minutes    # of Rest Breaks  1 test terminated at 3:48  0    MPH  1.88  2.13    METS  1.35  2.27    RPE  9  12    Perceived Dyspnea   2  1    VO2 Peak  4.74  7.94    Symptoms  Yes (comment)  Yes (comment)    Comments  fatigue  knee pain 6/10    Resting HR  78 bpm  81 bpm    Resting BP  132/74  136/74    Resting Oxygen Saturation   92 %  92 %    Exercise Oxygen Saturation  during 6 min walk  79 %  84 %    Max Ex. HR  116 bpm  109 bpm    Max Ex. BP  146/74  154/78    2 Minute Post BP  134/70  126/74      Interval HR   1 Minute HR  -  98    2 Minute HR  116  104    3 Minute HR  106  103    4 Minute HR  112 test terminated at 3:48  104    5 Minute HR  -  101    6 Minute HR  -  109    2 Minute Post HR  80  90    Interval Heart Rate?  Yes  Yes      Interval Oxygen   Interval Oxygen?  Yes  Yes    Baseline Oxygen Saturation %  92 %  92 %    1 Minute Oxygen Saturation %  84 %  91 %    1 Minute Liters of Oxygen  0 L Room Air  4 L    2 Minute Oxygen Saturation %  82 %  86 %    2 Minute Liters of Oxygen  0 L  4 L    3 Minute Oxygen Saturation %  81 % 80% 2:44  86 %    3 Minute Liters of Oxygen  0 L  4 L    4 Minute Oxygen Saturation %  79 % test terminated 3:48 and 78% 21mn  87 %    4 Minute Liters of Oxygen  0 L  4 L    5 Minute Oxygen Saturation %  85 %  86 % 5:46 84%    5 Minute Liters of Oxygen  0 L  4 L    6 Minute Oxygen Saturation %  -  86 % once seated 83%    6 Minute Liters of Oxygen  -  4 L    2 Minute Post Oxygen Saturation %  93 %  90 %    2 Minute Post Liters of Oxygen  83 L  4 L       Psychological, QOL, Others - Outcomes: PHQ 2/9: Depression [facility] 08/15/2017 07/11/2017 06/18/2017 05/14/2017 04/04/2016  Decreased Interest '1 1 2 2 2  ' Down, Depressed, Hopeless 0 1 0 0 2  PHQ - 2 Score '1 2 2 2 4  ' Altered sleeping 0 0 0 3 1  Tired, decreased energy '2 3 3 3 3  ' Change in  appetite 0 '2 2 2 1  ' Feeling bad or failure about yourself  0 2 0 3 0  Trouble concentrating 0 '1 1 2 1  ' Moving slowly or fidgety/restless 0 0 0 0 0  Suicidal thoughts 0 0 0 0 0  PHQ-9 Score '3 10 8 15 10  ' Difficult doing work/chores Not difficult at all Somewhat difficult Somewhat difficult Somewhat difficult Somewhat difficult    Quality of Life:   Personal Goals: Goals established at orientation with interventions provided to work toward goal. Personal Goals and Risk Factors at Admission - 05/14/17 1435      Core Components/Risk Factors/Patient Goals on Admission    Weight Management  Yes;Weight Loss    Intervention  Weight Management: Develop a combined nutrition and exercise program designed to reach desired caloric intake, while maintaining appropriate intake of nutrient and fiber, sodium and fats, and appropriate energy expenditure required for the weight goal.;Weight Management: Provide education and appropriate resources to help participant work on and attain dietary goals.    Admit Weight  258 lb 14.4 oz (117.4 kg)    Goal Weight: Short Term  253 lb (114.8 kg)    Goal Weight: Long Term  248 lb (112.5 kg)    Expected Outcomes  Short Term: Continue to assess and modify interventions until short term weight is achieved;Long Term: Adherence to nutrition and physical activity/exercise program aimed toward attainment of established weight goal;Understanding recommendations for meals to include 15-35% energy as protein, 25-35% energy from fat, 35-60% energy from carbohydrates, less than 260m of dietary cholesterol, 20-35 gm of total fiber daily;Understanding of distribution of calorie intake throughout the day with the consumption of 4-5 meals/snacks;Weight Loss: Understanding of general recommendations for a balanced deficit meal plan, which promotes 1-2 lb weight loss per week and includes a negative energy balance of [phone] kcal/d    Improve shortness of breath with ADL's  Yes     Intervention  Provide education, individualized exercise plan and daily activity instruction to help decrease symptoms of SOB with activities of daily living.    Expected Outcomes  Short Term: Achieves a reduction of symptoms when performing activities of daily living.    Diabetes  Yes    Intervention  Provide education about signs/symptoms and action to take for hypo/hyperglycemia.;Provide education about proper nutrition, including hydration, and aerobic/resistive exercise prescription along with prescribed medications to achieve blood glucose in normal ranges: Fasting glucose 65-99 mg/dL    Expected Outcomes  Short Term: Participant verbalizes understanding of the signs/symptoms and immediate care of hyper/hypoglycemia, proper foot care and importance of medication, aerobic/resistive exercise and nutrition plan for blood glucose control.;Long Term: Attainment of HbA1C < 7%.    Heart Failure  Yes    Intervention  Provide a combined exercise and nutrition program that is supplemented with education, support and counseling about heart failure. Directed toward relieving symptoms such as shortness of breath, decreased exercise tolerance, and extremity edema.    Expected Outcomes  Improve functional capacity of life;Short term: Attendance in program 2-3 days a week with increased exercise capacity. Reported lower sodium intake. Reported increased fruit and vegetable intake. Reports medication compliance.;Short term: Daily weights obtained and reported for increase. Utilizing diuretic protocols set by physician.;Long term: Adoption of self-care skills and reduction of barriers for early signs and symptoms recognition and intervention leading to self-care maintenance.    Hypertension  -- states he is not on blood pressure medication        Personal Goals Discharge: Goals and Risk Factor Review    Row Name 07/02/17 1026 07/30/17 1031           Core Components/Risk Factors/Patient Goals Review    Personal Goals Review  Weight Management/Obesity;Improve shortness of breath with ADL's;Heart Failure;Diabetes  Weight Management/Obesity;Improve shortness of breath with ADL's;Heart Failure;Hypertension      Review  LNydia Boutonis doing well in rehab. [facility] but his fluid is down overall with exercising routining.   His blood sugars have been lower overall and he has noticed that exercise is helping.  He has not had symptoms of heart faillure recently and weighs daily.  He has reduced the amount of sodium that he is eating.  [name]'s SOB is getting better and no he is able to walk upstairs without panicing because of SOB.     LNydia Boutonis taking meds as directed.  He has not had any symptoms of CHF - fluid weight, SOB, etc.  .  He states he can breathe better overall.       Expected Outcomes  Short: Continue to work on weight loss with diet and exercise.  Long: Continue to manage heart failure symptoms.   Short - LNydia Boutonwill finish LW program  Long -  [name] will continue to practice breathing, exercise and healthy dietary habits         Exercise Goals and Review: Exercise Goals    Row Name 05/14/17 1554             Exercise Goals   Increase Physical Activity  Yes       Intervention  Provide advice, education, support and counseling about physical activity/exercise needs.;Develop an individualized exercise prescription for aerobic and resistive training based on initial evaluation findings, risk stratification, comorbidities and participant's personal goals.       Expected Outcomes  Achievement of increased cardiorespiratory fitness and enhanced flexibility, muscular endurance and strength shown through measurements of functional capacity and personal statement of participant.       Increase Strength and Stamina  Yes       Intervention  Provide advice, education, support and counseling about physical activity/exercise needs.;Develop an individualized exercise prescription for aerobic  and resistive training based on initial evaluation findings, risk stratification, comorbidities and participant's personal goals.       Expected Outcomes  Achievement of increased cardiorespiratory fitness and enhanced flexibility, muscular endurance and strength shown through measurements of functional capacity and personal statement of participant.       Able to understand and use rate of perceived exertion (RPE) scale  Yes       Intervention  Provide education and explanation on how to use RPE scale       Expected Outcomes  Short Term: Able to use RPE daily in rehab to express subjective intensity level;Long Term:  Able to use RPE to guide intensity level when exercising independently       Able to understand and use Dyspnea scale  Yes       Intervention  Provide education and explanation on how to use Dyspnea scale       Expected Outcomes  Short Term: Able to use Dyspnea scale daily in rehab to express subjective sense of shortness of breath during exertion;Long Term: Able to use Dyspnea scale to guide intensity level when exercising independently       Knowledge and understanding of Target Heart Rate Range (THRR)  Yes       Intervention  Provide education and explanation of THRR including how the numbers were predicted and where they are located for reference       Expected Outcomes  Short Term: Able to state/look up THRR;Long Term: Able to use THRR to govern intensity when exercising independently;Short Term: Able to use daily as guideline for intensity in rehab       Able to check pulse independently  Yes       Intervention  Provide education and demonstration on how to check pulse in carotid and radial arteries.;Review the importance of being able to check your own pulse for safety during independent exercise       Expected Outcomes  Short Term: Able to explain why pulse checking is important during independent exercise;Long Term: Able to check pulse independently and accurately        Understanding of Exercise Prescription  Yes       Intervention  Provide education, explanation, and written materials on patient's individual exercise prescription       Expected Outcomes  Short Term: Able to explain program exercise prescription;Long Term: Able to explain home exercise prescription to exercise independently          Nutrition & Weight - Outcomes: Pre Biometrics - 05/14/17 1557  Pre Biometrics   Height  5' 9.3" (1.76 m)    Weight  258 lb 14.4 oz (117.4 kg)    Waist Circumference  48.75 inches    Hip Circumference  43.5 inches    Waist to Hip Ratio  1.12 %    BMI (Calculated)  37.91      Post Biometrics - 08/13/17 1001       Post  Biometrics   Height  5' 9.3" (1.76 m)    Weight  252 lb (114.3 kg)    Waist Circumference  46 inches    Hip Circumference  39 inches    Waist to Hip Ratio  1.18 %    BMI (Calculated)  36.9       Nutrition: Nutrition Therapy & Goals - 05/25/17 1150      Nutrition Therapy   Diet  Diabetic/ TLC    Protein (specify units)  150g    Fiber  35 grams    Saturated Fats  16 max. grams    Fruits and Vegetables  6 servings/day    Sodium  2000 grams      Personal Nutrition Goals   Nutrition Goal  Pair sources of fiber and protein together with carbohydrates to help improve blood glucose control    Personal Goal #2  Practice portion control when snacking, particularly while watching TV    Personal Goal #3  Make pie and candy bars "sometimes" foods      Intervention Plan   Intervention  Prescribe, educate and counsel regarding individualized specific dietary modifications aiming towards targeted core components such as weight, hypertension, lipid management, diabetes, heart failure and other comorbidities.;Nutrition handout(s) given to patient.    Expected Outcomes  Short Term Goal: Understand basic principles of dietary content, such as calories, fat, sodium, cholesterol and nutrients.;Short Term Goal: A plan has been developed with  personal nutrition goals set during dietitian appointment.;Long Term Goal: Adherence to prescribed nutrition plan.       Nutrition Discharge: Nutrition Assessments - 08/15/17 0934      MEDFICTS Scores   Post Score  50       Education Questionnaire Score: Knowledge Questionnaire Score - 08/15/17 0934      Knowledge Questionnaire Score   Pre Score  14/18    Post Score  14/18 reviewed with patient       Goals reviewed with patient; copy given to patient.

## 2017-08-15 NOTE — Patient Instructions (Signed)
Discharge Patient Instructions  Patient Details  Name: Mario [name] MRN: [id number] Date of Birth: 02/16/1946 Referring Provider:  Ezequiel Kayser, MD   Number of Visits: 36/36  Reason for Discharge:  Patient reached a stable level of exercise. Patient independent in their exercise. Patient has met program and personal goals.  Smoking History:  Social History   Tobacco Use  Smoking Status Former Smoker  . Packs/day: 0.50  . Years: 1.00  . Pack years: 0.50  . Types: Cigarettes  . Last attempt to quit: 05/01/1969  . Years since quitting: 48.3  Smokeless Tobacco Never Used    Diagnosis:  Chronic obstructive pulmonary disease, unspecified COPD type ([place])  Initial Exercise Prescription: Initial Exercise Prescription - 05/14/17 1500      Date of Initial Exercise RX and Referring Provider   Date  05/14/17    Referring Provider  Ezequiel Kayser MD      Oxygen   Oxygen  Continuous    Liters  2      Treadmill   MPH  1.8    Grade  0    Minutes  15    METs  2.38      Recumbant Elliptical   Level  1    RPM  50    Minutes  15    METs  2      T5 Nustep   Level  1    SPM  80    Minutes  15    METs  2      Prescription Details   Frequency (times per week)  3    Duration  Progress to 45 minutes of aerobic exercise without signs/symptoms of physical distress      Intensity   THRR 40-80% of Max Heartrate  106-135    Ratings of Perceived Exertion  11-13    Perceived Dyspnea  0-4      Progression   Progression  Continue to progress workloads to maintain intensity without signs/symptoms of physical distress.      Resistance Training   Training Prescription  Yes    Weight  3 lbs    Reps  10-15       Discharge Exercise Prescription (Final Exercise Prescription Changes): Exercise Prescription Changes - 08/08/17 1200      Response to Exercise   Blood Pressure (Admit)  130/74    Blood Pressure (Exit)  108/76    Heart Rate (Admit)  97 bpm    Heart Rate (Exercise)  99  bpm    Heart Rate (Exit)  74 bpm    Oxygen Saturation (Admit)  95 %    Oxygen Saturation (Exercise)  88 %    Oxygen Saturation (Exit)  97 %    Rating of Perceived Exertion (Exercise)  12    Perceived Dyspnea (Exercise)  1    Symptoms  none    Duration  Continue with 45 min of aerobic exercise without signs/symptoms of physical distress.    Intensity  THRR unchanged      Progression   Progression  Continue to progress workloads to maintain intensity without signs/symptoms of physical distress.    Average METs  1.9      Resistance Training   Training Prescription  Yes    Weight  7 lb    Reps  10-15      Interval Training   Interval Training  No      Oxygen   Oxygen  Continuous    Liters  4  Recumbant Elliptical   Level  2.5    RPM  48    Minutes  15    METs  1.7      T5 Nustep   Level  4    SPM  80    Minutes  15    METs  2.1      Home Exercise Plan   Plans to continue exercise at  [employer] (comment) [place]    Frequency  Add 2 additional days to program exercise sessions.    Initial Home Exercises Provided  06/27/17       Functional Capacity: 6 Minute Walk    Row Name 05/14/17 1546 08/13/17 0958       6 Minute Walk   Phase  Initial  Discharge    Distance  630 feet  1125 feet    Distance % Change  -  78.6 %    Distance Feet Change  -  495 ft    Walk Time  3.8 minutes  6 minutes    # of Rest Breaks  1 test terminated at 3:48  0    MPH  1.88  2.13    METS  1.35  2.27    RPE  9  12    Perceived Dyspnea   2  1    VO2 Peak  4.74  7.94    Symptoms  Yes (comment)  Yes (comment)    Comments  fatigue  knee pain 6/10    Resting HR  78 bpm  81 bpm    Resting BP  132/74  136/74    Resting Oxygen Saturation   92 %  92 %    Exercise Oxygen Saturation  during 6 min walk  79 %  84 %    Max Ex. HR  116 bpm  109 bpm    Max Ex. BP  146/74  154/78    2 Minute Post BP  134/70  126/74      Interval HR   1 Minute HR  -  98    2 Minute HR  116  104     3 Minute HR  106  103    4 Minute HR  112 test terminated at 3:48  104    5 Minute HR  -  101    6 Minute HR  -  109    2 Minute Post HR  80  90    Interval Heart Rate?  Yes  Yes      Interval Oxygen   Interval Oxygen?  Yes  Yes    Baseline Oxygen Saturation %  92 %  92 %    1 Minute Oxygen Saturation %  84 %  91 %    1 Minute Liters of Oxygen  0 L Room Air  4 L    2 Minute Oxygen Saturation %  82 %  86 %    2 Minute Liters of Oxygen  0 L  4 L    3 Minute Oxygen Saturation %  81 % 80% 2:44  86 %    3 Minute Liters of Oxygen  0 L  4 L    4 Minute Oxygen Saturation %  79 % test terminated 3:48 and 78% 52mn  87 %    4 Minute Liters of Oxygen  0 L  4 L    5 Minute Oxygen Saturation %  85 %  86 % 5:46 84%    5  Minute Liters of Oxygen  0 L  4 L    6 Minute Oxygen Saturation %  -  86 % once seated 83%    6 Minute Liters of Oxygen  -  4 L    2 Minute Post Oxygen Saturation %  93 %  90 %    2 Minute Post Liters of Oxygen  83 L  4 L       Quality of Life:   Personal Goals: Goals established at orientation with interventions provided to work toward goal. Personal Goals and Risk Factors at Admission - 05/14/17 1435      Core Components/Risk Factors/Patient Goals on Admission    Weight Management  Yes;Weight Loss    Intervention  Weight Management: Develop a combined nutrition and exercise program designed to reach desired caloric intake, while maintaining appropriate intake of nutrient and fiber, sodium and fats, and appropriate energy expenditure required for the weight goal.;Weight Management: Provide education and appropriate resources to help participant work on and attain dietary goals.    Admit Weight  258 lb 14.4 oz (117.4 kg)    Goal Weight: Short Term  253 lb (114.8 kg)    Goal Weight: Long Term  248 lb (112.5 kg)    Expected Outcomes  Short Term: Continue to assess and modify interventions until short term weight is achieved;Long Term: Adherence to nutrition and physical  activity/exercise program aimed toward attainment of established weight goal;Understanding recommendations for meals to include 15-35% energy as protein, 25-35% energy from fat, 35-60% energy from carbohydrates, less than 274m of dietary cholesterol, 20-35 gm of total fiber daily;Understanding of distribution of calorie intake throughout the day with the consumption of 4-5 meals/snacks;Weight Loss: Understanding of general recommendations for a balanced deficit meal plan, which promotes 1-2 lb weight loss per week and includes a negative energy balance of [phone] kcal/d    Improve shortness of breath with ADL's  Yes    Intervention  Provide education, individualized exercise plan and daily activity instruction to help decrease symptoms of SOB with activities of daily living.    Expected Outcomes  Short Term: Achieves a reduction of symptoms when performing activities of daily living.    Diabetes  Yes    Intervention  Provide education about signs/symptoms and action to take for hypo/hyperglycemia.;Provide education about proper nutrition, including hydration, and aerobic/resistive exercise prescription along with prescribed medications to achieve blood glucose in normal ranges: Fasting glucose 65-99 mg/dL    Expected Outcomes  Short Term: Participant verbalizes understanding of the signs/symptoms and immediate care of hyper/hypoglycemia, proper foot care and importance of medication, aerobic/resistive exercise and nutrition plan for blood glucose control.;Long Term: Attainment of HbA1C < 7%.    Heart Failure  Yes    Intervention  Provide a combined exercise and nutrition program that is supplemented with education, support and counseling about heart failure. Directed toward relieving symptoms such as shortness of breath, decreased exercise tolerance, and extremity edema.    Expected Outcomes  Improve functional capacity of life;Short term: Attendance in program 2-3 days a week with increased exercise  capacity. Reported lower sodium intake. Reported increased fruit and vegetable intake. Reports medication compliance.;Short term: Daily weights obtained and reported for increase. Utilizing diuretic protocols set by physician.;Long term: Adoption of self-care skills and reduction of barriers for early signs and symptoms recognition and intervention leading to self-care maintenance.    Hypertension  -- states he is not on blood pressure medication  Personal Goals Discharge: Goals and Risk Factor Review - 07/30/17 1031      Core Components/Risk Factors/Patient Goals Review   Personal Goals Review  Weight Management/Obesity;Improve shortness of breath with ADL's;Heart Failure;Hypertension    Review   [name] is taking meds as directed.  He has not had any symptoms of CHF - fluid weight, SOB, etc.  .  He states he can breathe better overall.     Expected Outcomes  Short - [name] will finish LW program  Long - [name] will continue to practice breathing, exercise and healthy dietary habits       Exercise Goals and Review: Exercise Goals    Row Name 05/14/17 1554             Exercise Goals   Increase Physical Activity  Yes       Intervention  Provide advice, education, support and counseling about physical activity/exercise needs.;Develop an individualized exercise prescription for aerobic and resistive training based on initial evaluation findings, risk stratification, comorbidities and participant's personal goals.       Expected Outcomes  Achievement of increased cardiorespiratory fitness and enhanced flexibility, muscular endurance and strength shown through measurements of functional capacity and personal statement of participant.       Increase Strength and Stamina  Yes       Intervention  Provide advice, education, support and counseling about physical activity/exercise needs.;Develop an individualized exercise prescription for aerobic and resistive training based on initial evaluation  findings, risk stratification, comorbidities and participant's personal goals.       Expected Outcomes  Achievement of increased cardiorespiratory fitness and enhanced flexibility, muscular endurance and strength shown through measurements of functional capacity and personal statement of participant.       Able to understand and use rate of perceived exertion (RPE) scale  Yes       Intervention  Provide education and explanation on how to use RPE scale       Expected Outcomes  Short Term: Able to use RPE daily in rehab to express subjective intensity level;Long Term:  Able to use RPE to guide intensity level when exercising independently       Able to understand and use Dyspnea scale  Yes       Intervention  Provide education and explanation on how to use Dyspnea scale       Expected Outcomes  Short Term: Able to use Dyspnea scale daily in rehab to express subjective sense of shortness of breath during exertion;Long Term: Able to use Dyspnea scale to guide intensity level when exercising independently       Knowledge and understanding of Target Heart Rate Range (THRR)  Yes       Intervention  Provide education and explanation of THRR including how the numbers were predicted and where they are located for reference       Expected Outcomes  Short Term: Able to state/look up THRR;Long Term: Able to use THRR to govern intensity when exercising independently;Short Term: Able to use daily as guideline for intensity in rehab       Able to check pulse independently  Yes       Intervention  Provide education and demonstration on how to check pulse in carotid and radial arteries.;Review the importance of being able to check your own pulse for safety during independent exercise       Expected Outcomes  Short Term: Able to explain why pulse checking is important during independent exercise;Long Term: Able to check  pulse independently and accurately       Understanding of Exercise Prescription  Yes        Intervention  Provide education, explanation, and written materials on patient's individual exercise prescription       Expected Outcomes  Short Term: Able to explain program exercise prescription;Long Term: Able to explain home exercise prescription to exercise independently          Nutrition & Weight - Outcomes: Pre Biometrics - 05/14/17 1557      Pre Biometrics   Height  5' 9.3" (1.76 m)    Weight  258 lb 14.4 oz (117.4 kg)    Waist Circumference  48.75 inches    Hip Circumference  43.5 inches    Waist to Hip Ratio  1.12 %    BMI (Calculated)  37.91      Post Biometrics - 08/13/17 1001       Post  Biometrics   Height  5' 9.3" (1.76 m)    Weight  252 lb (114.3 kg)    Waist Circumference  46 inches    Hip Circumference  39 inches    Waist to Hip Ratio  1.18 %    BMI (Calculated)  36.9       Nutrition: Nutrition Therapy & Goals - 05/25/17 1150      Nutrition Therapy   Diet  Diabetic/ TLC    Protein (specify units)  150g    Fiber  35 grams    Saturated Fats  16 max. grams    Fruits and Vegetables  6 servings/day    Sodium  2000 grams      Personal Nutrition Goals   Nutrition Goal  Pair sources of fiber and protein together with carbohydrates to help improve blood glucose control    Personal Goal #2  Practice portion control when snacking, particularly while watching TV    Personal Goal #3  Make pie and candy bars "sometimes" foods      Intervention Plan   Intervention  Prescribe, educate and counsel regarding individualized specific dietary modifications aiming towards targeted core components such as weight, hypertension, lipid management, diabetes, heart failure and other comorbidities.;Nutrition handout(s) given to patient.    Expected Outcomes  Short Term Goal: Understand basic principles of dietary content, such as calories, fat, sodium, cholesterol and nutrients.;Short Term Goal: A plan has been developed with personal nutrition goals set during dietitian  appointment.;Long Term Goal: Adherence to prescribed nutrition plan.       Nutrition Discharge: Nutrition Assessments - 08/15/17 0934      MEDFICTS Scores   Post Score  50       Education Questionnaire Score: Knowledge Questionnaire Score - 08/15/17 0934      Knowledge Questionnaire Score   Pre Score  14/18    Post Score  14/18 reviewed with patient       Goals reviewed with patient; copy given to patient.

## 2017-08-15 NOTE — Progress Notes (Signed)
Daily Session Note  Patient Details  Name: Mario [name] MRN: [id number] Date of Birth: 1946-03-08 Referring Provider:     Pulmonary Rehab from 05/14/2017 in [facility]  Referring Provider  Ezequiel Kayser MD      Encounter Date: 08/15/2017  Check In: Session Check In - 08/15/17 0931      Check-In   Location  [facility]-Cardiac & Pulmonary Rehab    Staff Present  Justin Mend RCP,RRT,BSRT;Amanda Oletta Darter, BA, ACSM CEP, Exercise Physiologist;Jessica Luan Pulling, MA, RCEP, CCRP, Exercise Physiologist    Supervising physician immediately available to respond to emergencies  [name] immediately available ER MD    Physician(s)  Dr. Owens Shark and Jimmye Norman    Medication changes reported      No    Fall or balance concerns reported     No    Tobacco Cessation  No Change    Warm-up and Cool-down  Performed as group-led instruction    Resistance Training Performed  Yes    VAD Patient?  No      Pain Assessment   Currently in Pain?  No/denies          Social History   Tobacco Use  Smoking Status Former Smoker  . Packs/day: 0.50  . Years: 1.00  . Pack years: 0.50  . Types: Cigarettes  . Last attempt to quit: 05/01/1969  . Years since quitting: 48.3  Smokeless Tobacco Never Used    Goals Met:  Proper associated with RPD/PD & O2 Sat Independence with exercise equipment Exercise tolerated well No report of cardiac concerns or symptoms Strength training completed today  Goals Unmet:  Not Applicable  Comments:  Roman graduated today from  rehab with 36 sessions completed.  Details of the patient's exercise prescription and what He needs to do in order to continue the prescription and progress were discussed with patient.  Patient was given a copy of prescription and goals.  Patient verbalized understanding.  Stanley plans to continue to exercise by joining the [employer].   Dr. Emily Filbert is Medical Director for [facility] and [name]  [facility].

## 2018-01-29 ENCOUNTER — Other Ambulatory Visit: Payer: Self-pay | Admitting: Orthopedic Surgery

## 2018-01-29 DIAGNOSIS — M5442 Lumbago with sciatica, left side: Secondary | ICD-10-CM

## 2018-01-29 DIAGNOSIS — G8929 Other chronic pain: Secondary | ICD-10-CM

## 2018-01-29 DIAGNOSIS — M5412 Radiculopathy, cervical region: Secondary | ICD-10-CM

## 2018-01-29 DIAGNOSIS — M5441 Lumbago with sciatica, right side: Secondary | ICD-10-CM

## 2018-02-15 ENCOUNTER — Ambulatory Visit
Admission: RE | Admit: 2018-02-15 | Discharge: 2018-02-15 | Disposition: A | Discharge status: Home / Self Care | Payer: Medicare Other | Source: Ambulatory Visit | Attending: Orthopedic Surgery | Admitting: Orthopedic Surgery

## 2018-02-15 DIAGNOSIS — M5412 Radiculopathy, cervical region: Secondary | ICD-10-CM

## 2018-02-15 DIAGNOSIS — G8929 Other chronic pain: Secondary | ICD-10-CM

## 2018-02-15 DIAGNOSIS — M50221 Other cervical disc displacement at C4-C5 level: Secondary | ICD-10-CM | POA: Diagnosis not present

## 2018-02-15 DIAGNOSIS — M4802 Spinal stenosis, cervical region: Secondary | ICD-10-CM | POA: Insufficient documentation

## 2018-02-15 DIAGNOSIS — M5441 Lumbago with sciatica, right side: Secondary | ICD-10-CM | POA: Diagnosis not present

## 2018-02-15 DIAGNOSIS — M5442 Lumbago with sciatica, left side: Secondary | ICD-10-CM | POA: Insufficient documentation

## 2018-03-13 ENCOUNTER — Other Ambulatory Visit: Payer: Self-pay | Admitting: Physical Medicine and Rehabilitation

## 2018-03-13 DIAGNOSIS — M5442 Lumbago with sciatica, left side: Secondary | ICD-10-CM

## 2018-04-03 ENCOUNTER — Ambulatory Visit
Admission: RE | Admit: 2018-04-03 | Discharge: 2018-04-03 | Disposition: A | Discharge status: Home / Self Care | Payer: Medicare Other | Source: Ambulatory Visit | Attending: Physical Medicine and Rehabilitation | Admitting: Physical Medicine and Rehabilitation

## 2018-04-03 DIAGNOSIS — M48061 Spinal stenosis, lumbar region without neurogenic claudication: Secondary | ICD-10-CM | POA: Diagnosis not present

## 2018-04-03 DIAGNOSIS — M5442 Lumbago with sciatica, left side: Secondary | ICD-10-CM | POA: Insufficient documentation

## 2018-04-03 DIAGNOSIS — M5441 Lumbago with sciatica, right side: Secondary | ICD-10-CM | POA: Diagnosis not present

## 2018-04-03 DIAGNOSIS — G8929 Other chronic pain: Secondary | ICD-10-CM | POA: Diagnosis not present

## 2018-04-03 DIAGNOSIS — M5136 Other intervertebral disc degeneration, lumbar region: Secondary | ICD-10-CM | POA: Insufficient documentation

## 2018-04-03 DIAGNOSIS — M5137 Other intervertebral disc degeneration, lumbosacral region: Secondary | ICD-10-CM | POA: Insufficient documentation

## 2018-04-03 DIAGNOSIS — M5126 Other intervertebral disc displacement, lumbar region: Secondary | ICD-10-CM | POA: Insufficient documentation

## 2018-04-11 ENCOUNTER — Ambulatory Visit: Payer: Medicare Other | Attending: Family Medicine | Admitting: Physical Therapy

## 2018-04-11 ENCOUNTER — Encounter: Payer: Self-pay | Admitting: Physical Therapy

## 2018-04-11 DIAGNOSIS — M6281 Muscle weakness (generalized): Secondary | ICD-10-CM | POA: Diagnosis present

## 2018-04-11 NOTE — Therapy (Addendum)
[place] [facility] [address] [place], [address] Phone: [phone]   Fax:  [phone]  Physical Therapy Evaluation  Patient Details  Name: Mario [name] MRN: [id number] Date of Birth: [birth year] No data recorded  Encounter Date: 04/11/2018  PT End of Session - 04/11/18 1543    Visit Number  1    Number of Visits  17    Date for PT Re-Evaluation  06/06/18    PT Start Time  0315    PT Stop Time  0415    PT Time Calculation (min)  60 min    Activity Tolerance  Patient tolerated treatment well    Behavior During Therapy  [facility] for tasks assessed/performed       Past Medical History:  Diagnosis Date  . COPD (chronic obstructive pulmonary disease) (HCC)   . Diabetes mellitus without complication (HCC)   . Diabetic neuropathy (HCC)   . Hypertension     Past Surgical History:  Procedure Laterality Date  . COLONOSCOPY  08/30/2015  . KNEE SURGERY Right    screw in knee  . WRIST SURGERY Left    metal removed    There were no vitals filed for this visit.  Subjective Assessment - 04/11/18 1524    Pertinent History  Patient is a 72 year old male presenting with bilat LE fatigue for over the past year. Patient denies LBP. Patient has history of COPD  and reports 02 use when he sleeps and "as he needs it" on 2L. Patient reports he has gone to cardiopulmonary rehab to "help his lungs" and "when he was going to the pool he felt great". Patient notes that when he is standing/walking for any period of time he feels like his "legs get tired" before his breathing becomes fatiguing. Patient reports he checks his 02 often and he is able to keep this above 88 wit pursed lip breathing, but that his "legs get tired". Reports deficitis in prolonged standing/walking, and bending. Has neuropathy in biilat hands/feet, denies tingling/electrical sensation down LE. N/V, unexplained weight fluctuation, B&B changes, saddle paresthesia,  fever, night sweats, or unrelenting night pain at this time.    Limitations  Lifting;Walking;House hold activities    How long can you sit comfortably?  unlimited    How long can you stand comfortably?     How long can you walk comfortably?     Diagnostic tests  MRI lumbar spine foraminal stenosis     Patient Stated Goals  Be able to complete ADLs without pain    Currently in Pain?  Yes    Pain Score  1     Pain Location  Back    Pain Orientation  Posterior    Pain Descriptors / Indicators  Dull    Pain Type  Chronic pain    Pain Onset  1 to 4 weeks ago    Pain Frequency  Intermittent    Aggravating Factors   prolonged walking/standing    Pain Relieving Factors  none    Effect of Pain on Daily Activities  unable to complete ADLs without fatigue               OBJECTIVE  Mental Status Patient is oriented to person, place and time.  Recent memory is intact.  Remote memory is intact.  Attention span and concentration are intact.  Expressive speech is intact.  Patient's fund of knowledge is within normal limits for educational level.  SENSATION: Grossly intact to light touch bilateral L as determined by testing dermatomes L2-S2 Proprioception and hot/cold testing deferred on this date   MUSCULOSKELETAL: Tremor: None Bulk: Normal  Tone: Normal   Posture forward flexed, kyphosis   Gait Decreased step length bilat, wide stance   Palpation No pain to palpation    Strength (out of 5) R/L 5/5 Hip flexion 5/5 Hip ER 5/5 Hip IR 4/4- Hip abduction 4/4 Hip adduction 4/4 Hip extension 5/5 Knee extension 5/5 Knee flexion 5/5 Ankle dorsiflexion *Indicates pain   AROM (degrees) R/L (all movements include overpressure unless otherwise stated) Lumbar forward flexion (0-65): 25% limited Lumbar extension (0-30): wnl Lumbar lateral flexion (0-25): wnl bilat Lumbar rotation: wnl bilat All hip motions wnl with some soft tissue restrictions *Indicates  pain  PROM (degrees) PROM = AROM  Passive Accessory Intervertebral Motion (PAIVM) Pt denies reproduction of posterior hip pain with CPA L1-L5 and UPA bilaterally L1-L5. Generally hypomobile throughout  Passive Physiological Intervertebral Motion (PPIVM) Normal flexion and extension with PPIVM testing   SPECIAL TESTS Slump: NEGATIVE bilat  SLR: NEGATIVE Crossed SLR: NEGATIVE FABER: NEGATIVE bilat  FADIR: NEGATIVE bilat  Hip scour: NEGATIVE bilat  Ely: No pain to palpation  Thomas: No pain to palpation  Ober: No pain to palpation  Deep Squat: 30sec STS 10 (following 02 76, up to 88 in 3 mins with pursed lip breathing) Walk test held as patient does not have 02 with him this date  Ther-Ex - STS x12 w/o UE support (HEP) - Bridge x12 (HEP) with min cuing initially for eccentric control and glute contraction - Education on diagnosis/condition with education on lumbar changes and their effect on the lower extremities, and the pain experience (pt reports he does not understanding why his back "does not hurt", PT encouraged pt that degenerative changes are not always symptomatic as pain is an individual experience, and this is a GOOD thing). Education on PT role to patient rehabilitation                                    PT Education - 04/11/18 1543    Education Details  Patient was educated on diagnosis, anatomy and pathology involved, prognosis, role of PT, and was given an HEP, demonstrating exercise with proper form following verbal and tactile cues, and was given a paper hand out to continue exercise at home. Pt was educated on and agreed to plan of care.    Person(s) Educated  Patient    Methods  Explanation;Demonstration;Tactile cues;Verbal cues;Handout    Comprehension  Verbalized understanding;Returned demonstration;Verbal cues required;Tactile cues required       PT Short Term Goals - 04/11/18 1637      PT SHORT TERM GOAL #1   Title  Pt  will be independent with HEP in order to improve strength and endurance in order to improve pain-free function    Time  4    Period  Weeks    Status  New        PT Long Term Goals - 04/11/18 1642      PT LONG TERM GOAL #1   Title  Patient will increase FOTO score to 79 to demonstrate predicted increase in functional mobility to complete ADLs    Baseline  04/18/18    Time  8    Period  Weeks    Status  New  Plan - 04/11/18 1643    Clinical Impression Statement  Patient is a 72 year old male with hx of COPD, DM2, and lumbar stenosis, presenting with chief complaint of bilat LE weakness. Patient with current impairments in LE weakness and endurance, soft tissue restrictions inhibiting LE/trunk motion, and pain. Patient currently unable to complete st bearing activity for longer than 2mins, with increased pain with bending/stooping; disallowing patient from participation in his ADLs. Would benefit from skilled PT to address above deficits and promote optimal return to PLOF    History and Personal Factors relevant to plan of care:  COPD (2L O2 "as needed""), DM2    Clinical Presentation  Evolving    Clinical Presentation due to:  Moderate (evolving): 1-2 personal factors/comorbidities, 3 or more body systems/activity limitations/participation restrictions      Clinical Decision Making  Moderate    Rehab Potential  Fair    Clinical Impairments Affecting Rehab Potential  (-) sedentary lifestyle, age, other comorbidities (+) motivation    PT Frequency  2x / week    PT Duration  8 weeks    PT Treatment/Interventions  ADLs/Self Care Home Management;Cryotherapy;Traction;Gait training;Therapeutic exercise;Patient/family education;Manual techniques;Passive range of motion;Spinal Manipulations;Dry needling;Aquatic Therapy;Moist Heat;Electrical Stimulation;Ultrasound;Stair training;Functional mobility training;Neuromuscular re-education;Therapeutic activities;Balance training;Energy  conservation    PT Next Visit Plan  walk test; flowsheet, endurance training    PT Home Exercise Plan  bridge, STS    Consulted and Agree with Plan of Care  Patient       Patient will benefit from skilled therapeutic intervention in order to improve the following deficits and impairments:  Abnormal gait, Decreased balance, Decreased endurance, Decreased mobility, Impaired sensation, Obesity, Improper body mechanics, Impaired tone, Decreased activity tolerance, Decreased strength, Impaired flexibility, Postural dysfunction, Pain  Visit Diagnosis: Muscle weakness (generalized)     Problem List Patient Active Problem List   Diagnosis Date Noted  . Lung nodule 02/09/2016  . Exercise hypoxemia 12/28/2015  . Chronic obstructive pulmonary disease (HCC) 03/16/2015  . Nuclear sclerosis 03/16/2015  . OSA (obstructive sleep apnea) 03/16/2015  . Primary open angle glaucoma of both eyes, severe stage 03/16/2015  . Type 2 diabetes mellitus without complication, without long-term current use of insulin (HCC) 03/16/2015  . Arthralgia of left lower leg 10/29/2014  . Osteoarthritis of left knee 09/10/2014  . Type 2 diabetes mellitus with diabetic neuropathy (HCC) 08/04/2014  . Cataract, nuclear sclerotic senile 04/01/2014  . 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 Primary open angle glaucoma 02/27/2014  . Flat feet 01/29/2014  . Foot pain, bilateral 01/29/2014  . Tinea pedis of both feet 01/29/2014  . Ptosis of both eyelids 11/25/2013  . Type 2 diabetes mellitus without retinopathy (HCC) 11/25/2013  . Onychomycosis 11/19/2013  . Chronic systolic heart failure (HCC) 11/11/2013  . Emphysema of lung (HCC) 11/11/2013  . Essential hypertension 11/11/2013  . Screen for colon cancer 11/05/2013  . Cardiomyopathy (HCC) 10/08/2013  . Systolic HF (heart failure) (HCC) 10/08/2013  . Acute systolic CHF (congestive heart failure) (HCC) 09/16/2013  .  COPD (chronic obstructive pulmonary disease) (HCC) 09/16/2013  . HTN (hypertension) 09/16/2013  . Obesity 09/16/2013  . PAF (paroxysmal atrial fibrillation) (HCC) 09/16/2013  . Senile nuclear sclerosis 05/22/2013  . Muscle weakness (generalized) 11/11/2009  . Long term current use of anticoagulant therapy 06/17/2009  . Pulmonary embolism (HCC) 06/17/2009  . ED (erectile dysfunction) of organic origin 05/13/2003   Staci Acostahelsea Miller PT, DPT Staci Acostahelsea Miller 04/11/2018, 5:31 PM  [place] [facility] [address] [place], [address] Phone: [phone]   Fax:  [phone]  Name: Mario [name] MRN: [id number] Date of Birth: 1946/03/19

## 2018-04-11 NOTE — Addendum Note (Signed)
Addended by: Staci AcostaMILLER, CHELSEA on: 04/11/2018 05:33 PM   Modules accepted: Orders

## 2018-04-15 ENCOUNTER — Encounter: Payer: Medicare Other | Admitting: Physical Therapy

## 2018-04-17 ENCOUNTER — Encounter: Payer: Medicare Other | Admitting: Physical Therapy

## 2018-04-30 ENCOUNTER — Ambulatory Visit: Payer: Medicare Other | Admitting: Physical Therapy

## 2018-05-08 ENCOUNTER — Encounter: Payer: Self-pay | Admitting: Physical Therapy

## 2018-05-08 ENCOUNTER — Ambulatory Visit: Payer: Medicare Other | Attending: Family Medicine | Admitting: Physical Therapy

## 2018-05-08 DIAGNOSIS — M6281 Muscle weakness (generalized): Secondary | ICD-10-CM | POA: Diagnosis present

## 2018-05-08 NOTE — Therapy (Signed)
[place] [facility] [address] [place], [place], [id number] Phone: [phone]   Fax:  [phone]  Physical Therapy Treatment  Patient Details  Name: Mario [name] MRN: [id number] Date of Birth: 03-Feb-1946 No data recorded  Encounter Date: 05/08/2018  PT End of Session - 05/08/18 1353    Visit Number  2    Number of Visits  17    Date for PT Re-Evaluation  06/06/18    PT Start Time  0145    PT Stop Time  0230    PT Time Calculation (min)  45 min    Activity Tolerance  Patient tolerated treatment well    Behavior During Therapy  [facility] for tasks assessed/performed       Past Medical History:  Diagnosis Date  . COPD (chronic obstructive pulmonary disease) (HCC)   . Diabetes mellitus without complication (HCC)   . Diabetic neuropathy (HCC)   . Hypertension     Past Surgical History:  Procedure Laterality Date  . COLONOSCOPY  08/30/2015  . KNEE SURGERY Right    screw in knee  . WRIST SURGERY Left    metal removed    There were no vitals filed for this visit.  Subjective Assessment - 05/08/18 1348    Subjective  Patient returns to PT after 4 weeks d/t having the flu, and not feeling well. Patient continues to report no pain, with continued weakness feeling in the legs. Patient brought his oxygen this session. Reports no compliance with HEP with sickness.     Pertinent History  Patient is a [age] year old male presenting with bilat LE fatigue for over the past year. Patient denies LBP. Patient has history of COPD  and reports 02 use when he sleeps and "as he needs it" on 2L. Patient reports he has gone to cardiopulmonary rehab to "help his lungs" and "when he was going to the pool he felt great". Patient notes that when he is standing/walking for any period of time he feels like his "legs get tired" before his breathing becomes fatiguing. Patient reports he checks his 02 often and he is able to keep this above 88 wit pursed  lip breathing, but that his "legs get tired". Reports deficitis in prolonged standing/walking, and bending. Has neuropathy in biilat hands/feet, denies tingling/electrical sensation down LE. N/V, unexplained weight fluctuation, B&B changes, saddle paresthesia, fever, night sweats, or unrelenting night pain at this time.    Limitations  Lifting;Walking;House hold activities    How long can you sit comfortably?  unlimited    How long can you stand comfortably?  3min    How long can you walk comfortably?  2min    Diagnostic tests  MRI lumbar spine foraminal stenosis     Patient Stated Goals  Be able to complete ADLs without pain    Pain Onset  1 to 4 weeks ago       There-Ex - 6MWT with 2L O2 with patient able to walk for 4mins 68100ft before seated rest break, followed by an additional 12460ft (73160ft altogether) with 02 dropping to 86 after first break and  79 after second break, PT encouraged pursed lip breathing which increased O2 back to 92% quickly - Bridge 3x 04/14/15 with cuing for glute contraction and to prevent LB extension with good carry over following - Hooklying clamshell RTB 3x 15/17/20 with cuing initially for eccentric control with good carry over following - Mini squat from elevated mat table 3x  02/05/09 with demo and max cuing initially for proper form with full hip ext, with good carry over  - Standing hip abd with unilateral UE support 3x 12 with demo and cuing to prevent lateral trunk lean, eccentric control, and posture with full hip ext with good carry over following                         PT Education - 05/08/18 1353    Education Details  Exercise form    Person(s) Educated  Patient    Methods  Explanation;Demonstration;Verbal cues    Comprehension  Verbalized understanding;Returned demonstration;Verbal cues required       PT Short Term Goals - 04/11/18 1637      PT SHORT TERM GOAL #1   Title  Pt will be independent with HEP in order to improve  strength and endurance in order to improve pain-free function    Time  4    Period  Weeks    Status  New        PT Long Term Goals - 04/11/18 1642      PT LONG TERM GOAL #1   Title  Patient will increase FOTO score to 79 to demonstrate predicted increase in functional mobility to complete ADLs    Baseline  04/18/18    Time  8    Period  Weeks    Status  New            Plan - 05/08/18 1409    Clinical Impression Statement  PT led patient through endurance training for LEs with isolated and functional movement. Patient is able to complete all therex with accuracy following PT cuing, with increased cuing needed at times. Pt utilized O2 throughout session with PT monitoring sats, encouraging pursed lip breathing to bring O2 up a couple times throughout session, with this and O2 use patient is able to maintain safe O2 sats    Rehab Potential  Fair    Clinical Impairments Affecting Rehab Potential  (-) sedentary lifestyle, age, other comorbidities (+) motivation    PT Frequency  2x / week    PT Duration  8 weeks    PT Treatment/Interventions  ADLs/Self Care Home Management;Cryotherapy;Traction;Gait training;Therapeutic exercise;Patient/family education;Manual techniques;Passive range of motion;Spinal Manipulations;Dry needling;Aquatic Therapy;Moist Heat;Electrical Stimulation;Ultrasound;Stair training;Functional mobility training;Neuromuscular re-education;Therapeutic activities;Balance training;Energy conservation    PT Next Visit Plan  endurance training    PT Home Exercise Plan  bridge, STS    Consulted and Agree with Plan of Care  Patient       Patient will benefit from skilled therapeutic intervention in order to improve the following deficits and impairments:  Abnormal gait, Decreased balance, Decreased endurance, Decreased mobility, Impaired sensation, Obesity, Improper body mechanics, Impaired tone, Decreased activity tolerance, Decreased strength, Impaired flexibility,  Postural dysfunction, Pain  Visit Diagnosis: Muscle weakness (generalized)     Problem List Patient Active Problem List   Diagnosis Date Noted  . Lung nodule 02/09/2016  . Exercise hypoxemia 12/28/2015  . Chronic obstructive pulmonary disease (HCC) 03/16/2015  . Nuclear sclerosis 03/16/2015  . OSA (obstructive sleep apnea) 03/16/2015  . Primary open angle glaucoma of both eyes, severe stage 03/16/2015  . Type 2 diabetes mellitus without complication, without long-term current use of insulin (HCC) 03/16/2015  . Arthralgia of left lower leg 10/29/2014  . Osteoarthritis of left knee 09/10/2014  . Type 2 diabetes mellitus with diabetic neuropathy (HCC) 08/04/2014  . Cataract, nuclear sclerotic senile 04/01/2014  .  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 Primary open angle glaucoma 02/27/2014  . Flat feet 01/29/2014  . Foot pain, bilateral 01/29/2014  . Tinea pedis of both feet 01/29/2014  . Ptosis of both eyelids 11/25/2013  . Type 2 diabetes mellitus without retinopathy (HCC) 11/25/2013  . Onychomycosis 11/19/2013  . Chronic systolic heart failure (HCC) 11/11/2013  . Emphysema of lung (HCC) 11/11/2013  . Essential hypertension 11/11/2013  . Screen for colon cancer 11/05/2013  . Cardiomyopathy (HCC) 10/08/2013  . Systolic HF (heart failure) (HCC) 10/08/2013  . Acute systolic CHF (congestive heart failure) (HCC) 09/16/2013  . COPD (chronic obstructive pulmonary disease) (HCC) 09/16/2013  . HTN (hypertension) 09/16/2013  . Obesity 09/16/2013  . PAF (paroxysmal atrial fibrillation) (HCC) 09/16/2013  . Senile nuclear sclerosis 05/22/2013  . Muscle weakness (generalized) 11/11/2009  . Long term current use of anticoagulant therapy 06/17/2009  . Pulmonary embolism (HCC) 06/17/2009  . ED (erectile dysfunction) of organic origin 05/13/2003   Staci Acosta PT, DPT Staci Acosta 05/08/2018, 2:32 PM  [facility]  [facility]IC[facility] [address] [place], [address] Phone: [phone]   Fax:  [phone]  Name: Mario [name] MRN: [id number] Date of Birth: 1945-06-04

## 2018-05-14 ENCOUNTER — Encounter: Payer: Self-pay | Admitting: Physical Therapy

## 2018-05-14 ENCOUNTER — Ambulatory Visit: Payer: Medicare Other | Admitting: Physical Therapy

## 2018-05-14 DIAGNOSIS — M6281 Muscle weakness (generalized): Secondary | ICD-10-CM | POA: Diagnosis not present

## 2018-05-14 NOTE — Therapy (Signed)
[facility] PHYSICAL AND SPORTS MEDICINE [address] [place], [place], [address] Phone: [phone]   Fax:  [phone]  Physical Therapy Treatment  Patient Details  Name: Mario [name] MRN: [id number] Date of Birth: July 18, 1945 No data recorded  Encounter Date: 05/14/2018  PT End of Session - 05/14/18 1309    Visit Number  3    Date for PT Re-Evaluation  06/06/18    PT Start Time  0100    PT Stop Time  0145    PT Time Calculation (min)  45 min    Activity Tolerance  Patient tolerated treatment well    Behavior During Therapy  [facility] for tasks assessed/performed       Past Medical History:  Diagnosis Date  . COPD (chronic obstructive pulmonary disease) (HCC)   . Diabetes mellitus without complication (HCC)   . Diabetic neuropathy (HCC)   . Hypertension     Past Surgical History:  Procedure Laterality Date  . COLONOSCOPY  08/30/2015  . KNEE SURGERY Right    screw in knee  . WRIST SURGERY Left    metal removed    There were no vitals filed for this visit.  Subjective Assessment - 05/14/18 1307    Subjective  Patient reports minimal LBP today, but reports the weather has been difficult on his breathing. Patient reports he is having increased fatigue with activity.     Pertinent History  Patient is a 73 year old male presenting with bilat LE fatigue for over the past year. Patient denies LBP. Patient has history of COPD  and reports 02 use when he sleeps and "as he needs it" on 2L. Patient reports he has gone to cardiopulmonary rehab to "help his lungs" and "when he was going to the pool he felt great". Patient notes that when he is standing/walking for any period of time he feels like his "legs get tired" before his breathing becomes fatiguing. Patient reports he checks his 02 often and he is able to keep this above 88 wit pursed lip breathing, but that his "legs get tired". Reports deficitis in prolonged standing/walking, and bending. Has  neuropathy in biilat hands/feet, denies tingling/electrical sensation down LE. N/V, unexplained weight fluctuation, B&B changes, saddle paresthesia, fever, night sweats, or unrelenting night pain at this time.    Limitations  Lifting;Walking;House hold activities    How long can you sit comfortably?  unlimited    How long can you stand comfortably?  3min    How long can you walk comfortably?  2min    Diagnostic tests  MRI lumbar spine foraminal stenosis     Patient Stated Goals  Be able to complete ADLs without pain    Pain Onset  1 to 4 weeks ago         Ther-Ex - Nustep L3 5min for increased resistance with endurance - STS 3x 10 without UE assistance with cuing for full hip ext which patient is able to comply with; and cuing for eccentric lowering, which patient is unable to fully comply with - Side stepping 6525ft; side stepping with RTB and PT HHA over 2x 5525ft  - Standing hip semi circles with slider 3x 10 each side with 2 finger treadmill bar support  - Step up onto 6in step pushing through LE on step 2x 10 each LE leading with cuing for controlled descent and full hip ext  PT Education - 05/14/18 1309    Education Details  Exercise form; energy conservation    Person(s) Educated  Patient    Methods  Explanation;Demonstration;Verbal cues;Tactile cues    Comprehension  Verbalized understanding;Returned demonstration;Verbal cues required;Tactile cues required       PT Short Term Goals - 04/11/18 1637      PT SHORT TERM GOAL #1   Title  Pt will be independent with HEP in order to improve strength and endurance in order to improve pain-free function    Time  4    Period  Weeks    Status  New        PT Long Term Goals - 04/11/18 1642      PT LONG TERM GOAL #1   Title  Patient will increase FOTO score to 79 to demonstrate predicted increase in functional mobility to complete ADLs    Baseline  04/18/18    Time  8    Period  Weeks     Status  New            Plan - 05/14/18 1346    Clinical Impression Statement  PT progressed therex with hip ext/abd focus with endurance range. Patient is able to complete all therex with accuracy following PT cuing, and O2 monitoring. O2 dropped as low as 82% with 2L with quick recover to >88% with pursed lip breathing. Rest breaks required between all sets    Rehab Potential  Fair    Clinical Impairments Affecting Rehab Potential  (-) sedentary lifestyle, age, other comorbidities (+) motivation    PT Frequency  2x / week    PT Duration  8 weeks    PT Treatment/Interventions  ADLs/Self Care Home Management;Cryotherapy;Traction;Gait training;Therapeutic exercise;Patient/family education;Manual techniques;Passive range of motion;Spinal Manipulations;Dry needling;Aquatic Therapy;Moist Heat;Electrical Stimulation;Ultrasound;Stair training;Functional mobility training;Neuromuscular re-education;Therapeutic activities;Balance training;Energy conservation    PT Next Visit Plan  endurance training    PT Home Exercise Plan  bridge, STS    Consulted and Agree with Plan of Care  Patient       Patient will benefit from skilled therapeutic intervention in order to improve the following deficits and impairments:  Abnormal gait, Decreased balance, Decreased endurance, Decreased mobility, Impaired sensation, Obesity, Improper body mechanics, Impaired tone, Decreased activity tolerance, Decreased strength, Impaired flexibility, Postural dysfunction, Pain  Visit Diagnosis: Muscle weakness (generalized)     Problem List Patient Active Problem List   Diagnosis Date Noted  . Lung nodule 02/09/2016  . Exercise hypoxemia 12/28/2015  . Chronic obstructive pulmonary disease (HCC) 03/16/2015  . Nuclear sclerosis 03/16/2015  . OSA (obstructive sleep apnea) 03/16/2015  . Primary open angle glaucoma of both eyes, severe stage 03/16/2015  . Type 2 diabetes mellitus without complication, without long-term  current use of insulin (HCC) 03/16/2015  . Arthralgia of left lower leg 10/29/2014  . Osteoarthritis of left knee 09/10/2014  . Type 2 diabetes mellitus with diabetic neuropathy (HCC) 08/04/2014  . Cataract, nuclear sclerotic senile 04/01/2014  . 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 Primary open angle glaucoma 02/27/2014  . Flat feet 01/29/2014  . Foot pain, bilateral 01/29/2014  . Tinea pedis of both feet 01/29/2014  . Ptosis of both eyelids 11/25/2013  . Type 2 diabetes mellitus without retinopathy (HCC) 11/25/2013  . Onychomycosis 11/19/2013  . Chronic systolic heart failure (HCC) 11/11/2013  . Emphysema of lung (HCC) 11/11/2013  . Essential hypertension 11/11/2013  . Screen for colon cancer 11/05/2013  .  Cardiomyopathy (HCC) 10/08/2013  . Systolic HF (heart failure) (HCC) 10/08/2013  . Acute systolic CHF (congestive heart failure) (HCC) 09/16/2013  . COPD (chronic obstructive pulmonary disease) (HCC) 09/16/2013  . HTN (hypertension) 09/16/2013  . Obesity 09/16/2013  . PAF (paroxysmal atrial fibrillation) (HCC) 09/16/2013  . Senile nuclear sclerosis 05/22/2013  . Muscle weakness (generalized) 11/11/2009  . Long term current use of anticoagulant therapy 06/17/2009  . Pulmonary embolism (HCC) 06/17/2009  . ED (erectile dysfunction) of organic origin 05/13/2003   Staci Acosta PT, DPT Staci Acosta 05/14/2018, 1:49 PM  [place] [facility] PHYSICAL AND SPORTS MEDICINE [address] [place], [address] Phone: [phone]   Fax:  [phone]  Name: Mario [name] MRN: [id number] Date of Birth: [birth year]

## 2018-05-16 ENCOUNTER — Ambulatory Visit: Payer: Medicare Other | Admitting: Physical Therapy

## 2018-05-16 ENCOUNTER — Encounter: Payer: Self-pay | Admitting: Physical Therapy

## 2018-05-16 DIAGNOSIS — M6281 Muscle weakness (generalized): Secondary | ICD-10-CM

## 2018-05-16 NOTE — Therapy (Signed)
[facility] [address] [place], [place], [address] Phone: [phone]   Fax:  [phone]  Physical Therapy Treatment  Patient Details  Name: Mario LeepOrlando Wandersee MRN: [id number] Date of Birth: 1946/01/24 No data recorded  Encounter Date: 05/16/2018  PT End of Session - 05/16/18 1436    Visit Number  4    Number of Visits  17    Date for PT Re-Evaluation  06/06/18    PT Start Time  0230    PT Stop Time  0315    PT Time Calculation (min)  45 min    Activity Tolerance  Patient tolerated treatment well    Behavior During Therapy  [facility] for tasks assessed/performed       Past Medical History:  Diagnosis Date  . COPD (chronic obstructive pulmonary disease) (HCC)   . Diabetes mellitus without complication (HCC)   . Diabetic neuropathy (HCC)   . Hypertension     Past Surgical History:  Procedure Laterality Date  . COLONOSCOPY  08/30/2015  . KNEE SURGERY Right    screw in knee  . WRIST SURGERY Left    metal removed    There were no vitals filed for this visit.  Subjective Assessment - 05/16/18 1433    Subjective  Patient reports he is having increased pain in b/l knees L>R that he reports is chronic, but has been increasing lately. Reports he is going to get an injection in the L knee Monday. Patient reports he was sore on the "outside of his hips" but that this is an "exercise soreness", which he is "okay with". Reports minimal compliance with his HEP.     Pertinent History  Patient is a 73 year old male presenting with bilat LE fatigue for over the past year. Patient denies LBP. Patient has history of COPD  and reports 02 use when he sleeps and "as he needs it" on 2L. Patient reports he has gone to cardiopulmonary rehab to "help his lungs" and "when he was going to the pool he felt great". Patient notes that when he is standing/walking for any period of time he feels like his "legs get tired" before his breathing becomes  fatiguing. Patient reports he checks his 02 often and he is able to keep this above 88 wit pursed lip breathing, but that his "legs get tired". Reports deficitis in prolonged standing/walking, and bending. Has neuropathy in biilat hands/feet, denies tingling/electrical sensation down LE. N/V, unexplained weight fluctuation, B&B changes, saddle paresthesia, fever, night sweats, or unrelenting night pain at this time.    Limitations  Lifting;Walking;House hold activities    How long can you sit comfortably?  unlimited    How long can you stand comfortably?  3min    How long can you walk comfortably?  2min    Diagnostic tests  MRI lumbar spine foraminal stenosis     Patient Stated Goals  Be able to complete ADLs without pain    Pain Onset  1 to 4 weeks ago       Ther-Ex - Nustep L3 6min for increased resistance with endurance - Mini squat at treadmill bar with 2 finger support x10; 2x 12 with min cuing initially for proper form/posture with good carry over following - Attempted lateral step down from 2in step, patient unable to complete d/t knee pain - MATRIX hip abd 40# 3x 12 each with TC initially for proper posture with good carry over following - Side stepping over  6in obstacle, over and back 3x 12 each LE with cuing initially for controlled stepping with good carry over following; CGA for safety - b/l heel raise 3x 12 with cuing for eccentric control with good carry over following                        PT Education - 05/16/18 1435    Education Details  Exercise form    Person(s) Educated  Patient    Methods  Explanation;Demonstration;Verbal cues    Comprehension  Verbalized understanding;Returned demonstration;Verbal cues required       PT Short Term Goals - 04/11/18 1637      PT SHORT TERM GOAL #1   Title  Pt will be independent with HEP in order to improve strength and endurance in order to improve pain-free function    Time  4    Period  Weeks    Status   New        PT Long Term Goals - 04/11/18 1642      PT LONG TERM GOAL #1   Title  Patient will increase FOTO score to 79 to demonstrate predicted increase in functional mobility to complete ADLs    Baseline  04/18/18    Time  8    Period  Weeks    Status  New            Plan - 05/16/18 1610    Clinical Impression Statement  PT continued therex progression for endurance and balance, which patient is able to complete with no increased pain, only noted muscle fatigue. Patient is able to correct therex accurately with cuing from PT, requiring some assistance for safety.     Rehab Potential  Fair    Clinical Impairments Affecting Rehab Potential  (-) sedentary lifestyle, age, other comorbidities (+) motivation    PT Frequency  2x / week    PT Duration  8 weeks    PT Treatment/Interventions  ADLs/Self Care Home Management;Cryotherapy;Traction;Gait training;Therapeutic exercise;Patient/family education;Manual techniques;Passive range of motion;Spinal Manipulations;Dry needling;Aquatic Therapy;Moist Heat;Electrical Stimulation;Ultrasound;Stair training;Functional mobility training;Neuromuscular re-education;Therapeutic activities;Balance training;Energy conservation    PT Next Visit Plan  endurance training    PT Home Exercise Plan  bridge, STS    Consulted and Agree with Plan of Care  Patient       Patient will benefit from skilled therapeutic intervention in order to improve the following deficits and impairments:  Abnormal gait, Decreased balance, Decreased endurance, Decreased mobility, Impaired sensation, Obesity, Improper body mechanics, Impaired tone, Decreased activity tolerance, Decreased strength, Impaired flexibility, Postural dysfunction, Pain  Visit Diagnosis: Muscle weakness (generalized)     Problem List Patient Active Problem List   Diagnosis Date Noted  . Lung nodule 02/09/2016  . Exercise hypoxemia 12/28/2015  . Chronic obstructive pulmonary disease (HCC)  03/16/2015  . Nuclear sclerosis 03/16/2015  . OSA (obstructive sleep apnea) 03/16/2015  . Primary open angle glaucoma of both eyes, severe stage 03/16/2015  . Type 2 diabetes mellitus without complication, without long-term current use of insulin (HCC) 03/16/2015  . Arthralgia of left lower leg 10/29/2014  . Osteoarthritis of left knee 09/10/2014  . Type 2 diabetes mellitus with diabetic neuropathy (HCC) 08/04/2014  . Cataract, nuclear sclerotic senile 04/01/2014  . 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 Primary open angle glaucoma 02/27/2014  . Flat feet 01/29/2014  . Foot pain, bilateral 01/29/2014  . Tinea pedis of both feet 01/29/2014  .  Ptosis of both eyelids 11/25/2013  . Type 2 diabetes mellitus without retinopathy (HCC) 11/25/2013  . Onychomycosis 11/19/2013  . Chronic systolic heart failure (HCC) 11/11/2013  . Emphysema of lung (HCC) 11/11/2013  . Essential hypertension 11/11/2013  . Screen for colon cancer 11/05/2013  . Cardiomyopathy (HCC) 10/08/2013  . Systolic HF (heart failure) (HCC) 10/08/2013  . Acute systolic CHF (congestive heart failure) (HCC) 09/16/2013  . COPD (chronic obstructive pulmonary disease) (HCC) 09/16/2013  . HTN (hypertension) 09/16/2013  . Obesity 09/16/2013  . PAF (paroxysmal atrial fibrillation) (HCC) 09/16/2013  . Senile nuclear sclerosis 05/22/2013  . Muscle weakness (generalized) 11/11/2009  . Long term current use of anticoagulant therapy 06/17/2009  . Pulmonary embolism (HCC) 06/17/2009  . ED (erectile dysfunction) of organic origin 05/13/2003   Staci Acosta PT, DPT Staci Acosta 05/16/2018, 4:12 PM  [facility] PHYSICAL AND SPORTS MEDICINE [address], [place], [address] Phone: [phone]   Fax:  [phone]  Name: Elante Siefke MRN: [id number] Date of Birth: 1945/10/30

## 2018-05-21 ENCOUNTER — Ambulatory Visit: Payer: Medicare Other | Admitting: Physical Therapy

## 2018-05-24 ENCOUNTER — Ambulatory Visit: Payer: Medicare Other

## 2018-05-24 DIAGNOSIS — M6281 Muscle weakness (generalized): Secondary | ICD-10-CM

## 2018-05-24 NOTE — Therapy (Signed)
[place] MEDICINE [address] [place], [address] Phone: [phone]   Fax:  [phone]  Physical Therapy Treatment  Patient Details  Name: Mario [name] MRN: [id number] Date of Birth: [birth year] No data recorded  Encounter Date: 05/24/2018  PT End of Session - 05/24/18 7517    Visit Number  5    Number of Visits  17    Date for PT Re-Evaluation  06/06/18    PT Start Time  0924    PT Stop Time  1000    PT Time Calculation (min)  36 min    Activity Tolerance  Patient tolerated treatment well    Behavior During Therapy  [facility] for tasks assessed/performed       Past Medical History:  Diagnosis Date  . COPD (chronic obstructive pulmonary disease) (HCC)   . Diabetes mellitus without complication (HCC)   . Diabetic neuropathy (HCC)   . Hypertension     Past Surgical History:  Procedure Laterality Date  . COLONOSCOPY  08/30/2015  . KNEE SURGERY Right    screw in knee  . WRIST SURGERY Left    metal removed    There were no vitals filed for this visit.  Subjective Assessment - 05/24/18 0933    Subjective  Patient reported that he had a cortisone shot in both knees on Wednesday.    Pertinent History  Patient is a 73 year old male presenting with bilat LE fatigue for over the past year. Patient denies LBP. Patient has history of COPD  and reports 02 use when he sleeps and "as he needs it" on 2L. Patient reports he has gone to cardiopulmonary rehab to "help his lungs" and "when he was going to the pool he felt great". Patient notes that when he is standing/walking for any period of time he feels like his "legs get tired" before his breathing becomes fatiguing. Patient reports he checks his 02 often and he is able to keep this above 88 wit pursed lip breathing, but that his "legs get tired". Reports deficitis in prolonged standing/walking, and bending. Has neuropathy in biilat hands/feet, denies tingling/electrical  sensation down LE. N/V, unexplained weight fluctuation, B&B changes, saddle paresthesia, fever, night sweats, or unrelenting night pain at this time.    Limitations  Lifting;Walking;House hold activities    How long can you sit comfortably?  unlimited    How long can you stand comfortably?     How long can you walk comfortably?     Diagnostic tests  MRI lumbar spine foraminal stenosis     Patient Stated Goals  Be able to complete ADLs without pain    Currently in Pain?  Yes    Pain Score  5     Pain Location  Knee    Pain Orientation  Right;Left    Pain Descriptors / Indicators  Aching;Sore    Pain Type  Chronic pain    Pain Onset  1 to 4 weeks ago      TREATMENT:  Ther-Ex - Nustep L3 for increased resistance with endurance, activity tolerance, intermittent assessment of HR and spO2 - Mini squat at treadmill bar with 2 finger support 2  x10  with min cuing initially for proper form/posture with good carry over following - MATRIX hip abd 40# 2x 12 each with TC initially for proper posture with good carry over following (needed seated rest break for O2) - Side stepping over 6in obstacle, over  and back 3x 10 each LE with cuing initially for controlled stepping with good carry over following; CGA for safety - forward and backward stepping over 6 in hurdle x10 ea side (heart rate 88-89%, up to 96% with seated rest break) - b/l heel raise 3x 12 with cuing for eccentric control with good carry over following     PT Education - 05/24/18 1000    Education Details  exercise technique/form    Person(s) Educated  Patient    Methods  Explanation;Demonstration;Verbal cues    Comprehension  Verbalized understanding;Returned demonstration       PT Short Term Goals - 04/11/18 1637      PT SHORT TERM GOAL #1   Title  Pt will be independent with HEP in order to improve strength and endurance in order to improve pain-free function    Time  4    Period  Weeks    Status  New         PT Long Term Goals - 04/11/18 1642      PT LONG TERM GOAL #1   Title  Patient will increase FOTO score to 79 to demonstrate predicted increase in functional mobility to complete ADLs    Baseline  04/18/18    Time  8    Period  Weeks    Status  New            Plan - 05/24/18 1037    Clinical Impression Statement  Patient without complaints of pain during session. Oxygen monitored throughout session, and pt instructed in sitting rest breaks with spO2 88%. Most challenged by foot clearance bilaterally with exercises. The patient would benefit from further skilled PT to maximize function and safety.    Rehab Potential  Fair    Clinical Impairments Affecting Rehab Potential  (-) sedentary lifestyle, age, other comorbidities (+) motivation    PT Frequency  2x / week    PT Duration  8 weeks    PT Treatment/Interventions  ADLs/Self Care Home Management;Cryotherapy;Traction;Gait training;Therapeutic exercise;Patient/family education;Manual techniques;Passive range of motion;Spinal Manipulations;Dry needling;Aquatic Therapy;Moist Heat;Electrical Stimulation;Ultrasound;Stair training;Functional mobility training;Neuromuscular re-education;Therapeutic activities;Balance training;Energy conservation    PT Next Visit Plan  endurance training    PT Home Exercise Plan  bridge, STS    Consulted and Agree with Plan of Care  Patient       Patient will benefit from skilled therapeutic intervention in order to improve the following deficits and impairments:  Abnormal gait, Decreased balance, Decreased endurance, Decreased mobility, Impaired sensation, Obesity, Improper body mechanics, Impaired tone, Decreased activity tolerance, Decreased strength, Impaired flexibility, Postural dysfunction, Pain  Visit Diagnosis: Muscle weakness (generalized)     Problem List Patient Active Problem List   Diagnosis Date Noted  . Lung nodule 02/09/2016  . Exercise hypoxemia 12/28/2015  . Chronic  obstructive pulmonary disease (HCC) 03/16/2015  . Nuclear sclerosis 03/16/2015  . OSA (obstructive sleep apnea) 03/16/2015  . Primary open angle glaucoma of both eyes, severe stage 03/16/2015  . Type 2 diabetes mellitus without complication, without long-term current use of insulin (HCC) 03/16/2015  . Arthralgia of left lower leg 10/29/2014  . Osteoarthritis of left knee 09/10/2014  . Type 2 diabetes mellitus with diabetic neuropathy (HCC) 08/04/2014  . Cataract, nuclear sclerotic senile 04/01/2014  . 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 Primary open angle glaucoma 02/27/2014  . Flat feet 01/29/2014  . Foot pain, bilateral 01/29/2014  . Tinea pedis of both feet 01/29/2014  .  Ptosis of both eyelids 11/25/2013  . Type 2 diabetes mellitus without retinopathy (HCC) 11/25/2013  . Onychomycosis 11/19/2013  . Chronic systolic heart failure (HCC) 11/11/2013  . Emphysema of lung (HCC) 11/11/2013  . Essential hypertension 11/11/2013  . Screen for colon cancer 11/05/2013  . Cardiomyopathy (HCC) 10/08/2013  . Systolic HF (heart failure) (HCC) 10/08/2013  . Acute systolic CHF (congestive heart failure) (HCC) 09/16/2013  . COPD (chronic obstructive pulmonary disease) (HCC) 09/16/2013  . HTN (hypertension) 09/16/2013  . Obesity 09/16/2013  . PAF (paroxysmal atrial fibrillation) (HCC) 09/16/2013  . Senile nuclear sclerosis 05/22/2013  . Muscle weakness (generalized) 11/11/2009  . Long term current use of anticoagulant therapy 06/17/2009  . Pulmonary embolism (HCC) 06/17/2009  . ED (erectile dysfunction) of organic origin 05/13/2003   Olga Coasteriana Chaye Misch PT, DPT 10:48 AM,05/24/18 [phone]  [place] [facility]YS[facility] SPORTS MEDICINE [address] [place], [place], [address] Phone: [phone]   Fax:  [phone]  Name: Mario [name] MRN: [id number] Date of Birth: May 18, 1945

## 2018-05-27 ENCOUNTER — Ambulatory Visit: Payer: Medicare Other | Admitting: Physical Therapy

## 2018-05-30 ENCOUNTER — Ambulatory Visit: Payer: Medicare Other | Admitting: Physical Therapy

## 2018-06-03 ENCOUNTER — Ambulatory Visit: Payer: Medicare Other | Attending: Family Medicine | Admitting: Physical Therapy

## 2018-06-03 ENCOUNTER — Encounter: Payer: Self-pay | Admitting: Physical Therapy

## 2018-06-03 DIAGNOSIS — M6281 Muscle weakness (generalized): Secondary | ICD-10-CM | POA: Diagnosis not present

## 2018-06-03 NOTE — Therapy (Signed)
[facility] [address], [place], [address] Phone: [phone]   Fax:  [phone]  Physical Therapy Treatment  Patient Details  Name: Mario [name] MRN: [id number] Date of Birth: [birth year] No data recorded  Encounter Date: 06/03/2018  PT End of Session - 06/03/18 1611    Visit Number  6    Number of Visits  17    Date for PT Re-Evaluation  06/06/18    PT Start Time  0400    PT Stop Time  0440    PT Time Calculation (min)  40 min    Activity Tolerance  Treatment limited secondary to medical complications (Comment)    Behavior During Therapy  [facility] for tasks assessed/performed       Past Medical History:  Diagnosis Date  . COPD (chronic obstructive pulmonary disease) (HCC)   . Diabetes mellitus without complication (HCC)   . Diabetic neuropathy (HCC)   . Hypertension     Past Surgical History:  Procedure Laterality Date  . COLONOSCOPY  08/30/2015  . KNEE SURGERY Right    screw in knee  . WRIST SURGERY Left    metal removed    There were no vitals filed for this visit.  Subjective Assessment - 06/03/18 1606    Subjective  Patient reports no pain today. Patient forgot oxygen tank and is worried about what he will be able to do this sesison.    Pertinent History  Patient is a 73 year old male presenting with bilat LE fatigue for over the past year. Patient denies LBP. Patient has history of COPD  and reports 02 use when he sleeps and "as he needs it" on 2L. Patient reports he has gone to cardiopulmonary rehab to "help his lungs" and "when he was going to the pool he felt great". Patient notes that when he is standing/walking for any period of time he feels like his "legs get tired" before his breathing becomes fatiguing. Patient reports he checks his 02 often and he is able to keep this above 88 wit pursed lip breathing, but that his "legs get tired". Reports deficitis in prolonged standing/walking, and  bending. Has neuropathy in biilat hands/feet, denies tingling/electrical sensation down LE. N/V, unexplained weight fluctuation, B&B changes, saddle paresthesia, fever, night sweats, or unrelenting night pain at this time.    Limitations  Lifting;Walking;House hold activities    How long can you sit comfortably?  unlimited    How long can you stand comfortably?     How long can you walk comfortably?     Diagnostic tests  MRI lumbar spine foraminal stenosis     Patient Stated Goals  Be able to complete ADLs without pain    Pain Onset  1 to 4 weeks ago         Ther-Ex - Nustep over total on L1 with 2 breaks to raise O2 (first rest with O2 dropping to 76%, and second time 86%) with continued education on pursed lip breathing - Seated marches 3x 12 each LE with O2 staying between 86-88% during reps wit continued pursed lip breathing between sets to return O2 90% or above - Bridges 3x 12 with same O2 monitoring and min cuing for proper glute contraction without low back extension compensation - Supine clamshell GTB 3x 12 with min cuing for eccentric control - O2 monitored throughout session  PT Education - 06/03/18 1610    Education Details  Exercise form, energy conservation    Person(s) Educated  Patient    Methods  Explanation;Verbal cues;Tactile cues    Comprehension  Verbalized understanding;Verbal cues required;Tactile cues required       PT Short Term Goals - 04/11/18 1637      PT SHORT TERM GOAL #1   Title  Pt will be independent with HEP in order to improve strength and endurance in order to improve pain-free function    Time  4    Period  Weeks    Status  New        PT Long Term Goals - 04/11/18 1642      PT LONG TERM GOAL #1   Title  Patient will increase FOTO score to 79 to demonstrate predicted increase in functional mobility to complete ADLs    Baseline  04/18/18    Time  8    Period  Weeks    Status  New             Plan - 06/03/18 1623    Clinical Impression Statement  Patient without supplemental oxygen this session, limiting therapeutic interventions. PT monitored O2 throughout session with patient needing to continuously be cued for pursed lip breathing, with education on importance of maintaining proper O2 levels. PT will continue therex progression as medical condition allows.     Rehab Potential  Fair    Clinical Impairments Affecting Rehab Potential  (-) sedentary lifestyle, age, other comorbidities (+) motivation    PT Frequency  2x / week    PT Duration  8 weeks    PT Treatment/Interventions  ADLs/Self Care Home Management;Cryotherapy;Traction;Gait training;Therapeutic exercise;Patient/family education;Manual techniques;Passive range of motion;Spinal Manipulations;Dry needling;Aquatic Therapy;Moist Heat;Electrical Stimulation;Ultrasound;Stair training;Functional mobility training;Neuromuscular re-education;Therapeutic activities;Balance training;Energy conservation    PT Next Visit Plan  endurance training    PT Home Exercise Plan  bridge, STS    Consulted and Agree with Plan of Care  Patient       Patient will benefit from skilled therapeutic intervention in order to improve the following deficits and impairments:  Abnormal gait, Decreased balance, Decreased endurance, Decreased mobility, Impaired sensation, Obesity, Improper body mechanics, Impaired tone, Decreased activity tolerance, Decreased strength, Impaired flexibility, Postural dysfunction, Pain  Visit Diagnosis: Muscle weakness (generalized)     Problem List Patient Active Problem List   Diagnosis Date Noted  . Lung nodule 02/09/2016  . Exercise hypoxemia 12/28/2015  . Chronic obstructive pulmonary disease (HCC) 03/16/2015  . Nuclear sclerosis 03/16/2015  . OSA (obstructive sleep apnea) 03/16/2015  . Primary open angle glaucoma of both eyes, severe stage 03/16/2015  . Type 2 diabetes mellitus without  complication, without long-term current use of insulin (HCC) 03/16/2015  . Arthralgia of left lower leg 10/29/2014  . Osteoarthritis of left knee 09/10/2014  . Type 2 diabetes mellitus with diabetic neuropathy (HCC) 08/04/2014  . Cataract, nuclear sclerotic senile 04/01/2014  . 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 Primary open angle glaucoma 02/27/2014  . Flat feet 01/29/2014  . Foot pain, bilateral 01/29/2014  . Tinea pedis of both feet 01/29/2014  . Ptosis of both eyelids 11/25/2013  . Type 2 diabetes mellitus without retinopathy (HCC) 11/25/2013  . Onychomycosis 11/19/2013  . Chronic systolic heart failure (HCC) 11/11/2013  . Emphysema of lung (HCC) 11/11/2013  . Essential hypertension 11/11/2013  . Screen for colon cancer 11/05/2013  . Cardiomyopathy (HCC) 10/08/2013  .  Systolic HF (heart failure) (HCC) 10/08/2013  . Acute systolic CHF (congestive heart failure) (HCC) 09/16/2013  . COPD (chronic obstructive pulmonary disease) (HCC) 09/16/2013  . HTN (hypertension) 09/16/2013  . Obesity 09/16/2013  . PAF (paroxysmal atrial fibrillation) (HCC) 09/16/2013  . Senile nuclear sclerosis 05/22/2013  . Muscle weakness (generalized) 11/11/2009  . Long term current use of anticoagulant therapy 06/17/2009  . Pulmonary embolism (HCC) 06/17/2009  . ED (erectile dysfunction) of organic origin 05/13/2003   Staci Acostahelsea Miller PT, DPT Staci Acostahelsea Miller 06/03/2018, 5:06 PM  [facility] [address] [place], [place], [address] Phone: [phone]   Fax:  [phone]  Name: Mario [name] MRN: [id number] Date of Birth: 1945/05/25

## 2018-06-05 ENCOUNTER — Ambulatory Visit: Payer: Medicare Other | Admitting: Physical Therapy

## 2018-06-05 ENCOUNTER — Encounter: Payer: Self-pay | Admitting: Physical Therapy

## 2018-06-05 DIAGNOSIS — M6281 Muscle weakness (generalized): Secondary | ICD-10-CM

## 2018-06-05 NOTE — Therapy (Signed)
[facility] [address], [place], [address] Phone: [phone]   Fax:  [phone]  Physical Therapy Treatment  Patient Details  Name: Mario [name] MRN: [id number] Date of Birth: 1945/09/03 No data recorded  Encounter Date: 06/05/2018  PT End of Session - 06/05/18 1623    Visit Number  7    Number of Visits  17    Date for PT Re-Evaluation  06/06/18    PT Start Time  0417    PT Stop Time  0455    PT Time Calculation (min)  38 min    Activity Tolerance  Treatment limited secondary to medical complications (Comment)    Behavior During Therapy  [facility] for tasks assessed/performed       Past Medical History:  Diagnosis Date  . COPD (chronic obstructive pulmonary disease) (HCC)   . Diabetes mellitus without complication (HCC)   . Diabetic neuropathy (HCC)   . Hypertension     Past Surgical History:  Procedure Laterality Date  . COLONOSCOPY  08/30/2015  . KNEE SURGERY Right    screw in knee  . WRIST SURGERY Left    metal removed    There were no vitals filed for this visit.  Subjective Assessment - 06/05/18 1624    Subjective  Patient reports no pain since last visit. Continued LE fatigue. Patient reports compliance with HEP    Pertinent History  Patient is a 73 year old male presenting with bilat LE fatigue for over the past year. Patient denies LBP. Patient has history of COPD  and reports 02 use when he sleeps and "as he needs it" on 2L. Patient reports he has gone to cardiopulmonary rehab to "help his lungs" and "when he was going to the pool he felt great". Patient notes that when he is standing/walking for any period of time he feels like his "legs get tired" before his breathing becomes fatiguing. Patient reports he checks his 02 often and he is able to keep this above 88 wit pursed lip breathing, but that his "legs get tired". Reports deficitis in prolonged standing/walking, and bending. Has  neuropathy in biilat hands/feet, denies tingling/electrical sensation down LE. N/V, unexplained weight fluctuation, B&B changes, saddle paresthesia, fever, night sweats, or unrelenting night pain at this time.    Limitations  Lifting;Walking;House hold activities    How long can you sit comfortably?  unlimited    How long can you stand comfortably?     How long can you walk comfortably?     Diagnostic tests  MRI lumbar spine foraminal stenosis     Patient Stated Goals  Be able to complete ADLs without pain    Pain Onset  1 to 4 weeks ago         Ther-Ex - for endurance with O2 monitoring with pursed lip breathing encouragement with occasional touch down to 87% SpO2 766ft; gait speed 0.12m/s - 30sec STS trial 1: 12 trial 2: 12 without UE - Nustep L43min SPM over 70, for increased resistance with endurance, activity tolerance, intermittent assessment of HR and spO2 - Side stepping with no UE support and RTB over 55ft x4 with seated rest break between 2 sets. Cuing throughout for eccentric control and to "keep toes forward" - Toe walking with bilat HHA 36ft x4 with seated rest break between 2 sets following 2nd set O2 dropped to 68% and quickly (over ) returned to 91% with seated rest. Cuing throughout  to maintain heel off                          PT Education - 06/05/18 1622    Education Details  Exercise form; O2 monitoring    Person(s) Educated  Patient    Methods  Explanation;Demonstration;Verbal cues    Comprehension  Verbalized understanding;Returned demonstration;Verbal cues required       PT Short Term Goals - 04/11/18 1637      PT SHORT TERM GOAL #1   Title  Pt will be independent with HEP in order to improve strength and endurance in order to improve pain-free function    Time  4    Period  Weeks    Status  New        PT Long Term Goals - 06/05/18 1651      PT LONG TERM GOAL #1   Title  Patient will increase FOTO score to  79 to demonstrate predicted increase in functional mobility to complete ADLs    Baseline  04/18/18    Time  8    Period  Weeks    Status  New      PT LONG TERM GOAL #2   Title  Patient will complete 12 or more STS in 30sec in order to demonstrate age matched LE endurance    Baseline  04/11/18 10    Time  8    Period  Weeks    Status  New      PT LONG TERM GOAL #3   Title   Pt will increase 6MWT by at least 5449m (17964ft) in order to demonstrate clinically significant improvement in cardiopulmonary endurance and community ambulation    Baseline  06/05/18 72390ft    Time  8    Period  Weeks    Status  New      PT LONG TERM GOAL #4   Title  Pt will increase 10MWT by at least 0.13 m/s in order to demonstrate clinically significant improvement in community ambulation.     Baseline  06/05/18 0.6470m/s    Time  8    Period  Weeks    Status  New            Plan - 06/05/18 1659    Clinical Impression Statement  PT increased standing demand this session, increasing LE endurance progression. Patient is able to complete all therex with proper form with cuing from PT and gaurding for safety with O2 monitoring. PT will continue progression as able.     Rehab Potential  Fair    Clinical Impairments Affecting Rehab Potential  (-) sedentary lifestyle, age, other comorbidities (+) motivation    PT Frequency  2x / week    PT Duration  8 weeks    PT Treatment/Interventions  ADLs/Self Care Home Management;Cryotherapy;Traction;Gait training;Therapeutic exercise;Patient/family education;Manual techniques;Passive range of motion;Spinal Manipulations;Dry needling;Aquatic Therapy;Moist Heat;Electrical Stimulation;Ultrasound;Stair training;Functional mobility training;Neuromuscular re-education;Therapeutic activities;Balance training;Energy conservation    PT Next Visit Plan  endurance training    PT Home Exercise Plan  bridge, STS    Consulted and Agree with Plan of Care  Patient       Patient will benefit  from skilled therapeutic intervention in order to improve the following deficits and impairments:  Abnormal gait, Decreased balance, Decreased endurance, Decreased mobility, Impaired sensation, Obesity, Improper body mechanics, Impaired tone, Decreased activity tolerance, Decreased strength, Impaired flexibility, Postural dysfunction, Pain  Visit Diagnosis: Muscle weakness (generalized)  Problem List Patient Active Problem List   Diagnosis Date Noted  . Lung nodule 02/09/2016  . Exercise hypoxemia 12/28/2015  . Chronic obstructive pulmonary disease (HCC) 03/16/2015  . Nuclear sclerosis 03/16/2015  . OSA (obstructive sleep apnea) 03/16/2015  . Primary open angle glaucoma of both eyes, severe stage 03/16/2015  . Type 2 diabetes mellitus without complication, without long-term current use of insulin (HCC) 03/16/2015  . Arthralgia of left lower leg 10/29/2014  . Osteoarthritis of left knee 09/10/2014  . Type 2 diabetes mellitus with diabetic neuropathy (HCC) 08/04/2014  . Cataract, nuclear sclerotic senile 04/01/2014  . 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 Primary open angle glaucoma 02/27/2014  . Flat feet 01/29/2014  . Foot pain, bilateral 01/29/2014  . Tinea pedis of both feet 01/29/2014  . Ptosis of both eyelids 11/25/2013  . Type 2 diabetes mellitus without retinopathy (HCC) 11/25/2013  . Onychomycosis 11/19/2013  . Chronic systolic heart failure (HCC) 11/11/2013  . Emphysema of lung (HCC) 11/11/2013  . Essential hypertension 11/11/2013  . Screen for colon cancer 11/05/2013  . Cardiomyopathy (HCC) 10/08/2013  . Systolic HF (heart failure) (HCC) 10/08/2013  . Acute systolic CHF (congestive heart failure) (HCC) 09/16/2013  . COPD (chronic obstructive pulmonary disease) (HCC) 09/16/2013  . HTN (hypertension) 09/16/2013  . Obesity 09/16/2013  . PAF (paroxysmal atrial fibrillation) (HCC) 09/16/2013  . Senile nuclear  sclerosis 05/22/2013  . Muscle weakness (generalized) 11/11/2009  . Long term current use of anticoagulant therapy 06/17/2009  . Pulmonary embolism (HCC) 06/17/2009  . ED (erectile dysfunction) of organic origin 05/13/2003   Staci Acostahelsea Miller PT, DPT Staci Acostahelsea Miller 06/05/2018, 5:01 PM  [place] [facility] [address] [place], [place], [address] Phone: [phone]   Fax:  [phone]  Name: Mario [name] MRN: [id number] Date of Birth: 1946/02/26

## 2018-06-11 ENCOUNTER — Encounter: Payer: Self-pay | Admitting: Physical Therapy

## 2018-06-11 ENCOUNTER — Ambulatory Visit: Payer: Medicare Other | Admitting: Physical Therapy

## 2018-06-11 DIAGNOSIS — M6281 Muscle weakness (generalized): Secondary | ICD-10-CM | POA: Diagnosis not present

## 2018-06-11 NOTE — Therapy (Signed)
[facility] [address] [place], [address] Phone: [phone]   Fax:  [phone]  Physical Therapy Treatment  Patient Details  Name: Mario [name] MRN: [id number] Date of Birth: Aug 30, 1945 No data recorded  Encounter Date: 06/11/2018  PT End of Session - 06/11/18 1319    Visit Number  8    Number of Visits  17    Date for PT Re-Evaluation  08/01/18    PT Start Time  0113    PT Stop Time  0145    PT Time Calculation (min)  32 min    Activity Tolerance  Treatment limited secondary to medical complications (Comment)    Behavior During Therapy  [facility] for tasks assessed/performed       Past Medical History:  Diagnosis Date  . COPD (chronic obstructive pulmonary disease) (HCC)   . Diabetes mellitus without complication (HCC)   . Diabetic neuropathy (HCC)   . Hypertension     Past Surgical History:  Procedure Laterality Date  . COLONOSCOPY  08/30/2015  . KNEE SURGERY Right    screw in knee  . WRIST SURGERY Left    metal removed    There were no vitals filed for this visit.  Subjective Assessment - 06/11/18 1318    Subjective  Patient reports continued no pain. Patient reports compliance with HEP and that he is still having some fatigue that makes him "worried about falling"    Pertinent History  Patient is a 73 year old male presenting with bilat LE fatigue for over the past year. Patient denies LBP. Patient has history of COPD  and reports 02 use when he sleeps and "as he needs it" on 2L. Patient reports he has gone to cardiopulmonary rehab to "help his lungs" and "when he was going to the pool he felt great". Patient notes that when he is standing/walking for any period of time he feels like his "legs get tired" before his breathing becomes fatiguing. Patient reports he checks his 02 often and he is able to keep this above 88 wit pursed lip breathing, but that his "legs get tired". Reports deficitis in  prolonged standing/walking, and bending. Has neuropathy in biilat hands/feet, denies tingling/electrical sensation down LE. N/V, unexplained weight fluctuation, B&B changes, saddle paresthesia, fever, night sweats, or unrelenting night pain at this time.    Limitations  Lifting;Walking;House hold activities    How long can you sit comfortably?  unlimited    How long can you stand comfortably?     How long can you walk comfortably?     Diagnostic tests  MRI lumbar spine foraminal stenosis     Patient Stated Goals  Be able to complete ADLs without pain    Pain Onset  1 to 4 weeks ago       Ther-Ex - Nustep L51min SPM over 70, for increased resistance with endurance, activity tolerance, intermittent assessment of HR and spO2 (90% or above) - STS without UE 3 x12 with VC for eccentric control with lowering and full hip ext with stand; good carry over following. Following sets O2 touched 87% with quick return to 90 and above - Multiple bouts of ball catch/toss with PT over 58sec ; on airex pad 44 sec with PT giving modA to prevent LOB; 53sec on airex pad with patient utilizing sitting in chair to prevent LOB to improve standing tolerance and dynamic task balance; seated rest breaks between sets -  Heel raise 3x 15 with standing rest breaks between. Min cuing initially for eccentric lowering with good carry over following                         PT Education - 06/11/18 1319    Education Details  O2 monitoring; exercise form    Person(s) Educated  Patient    Methods  Explanation;Verbal cues    Comprehension  Verbalized understanding;Verbal cues required       PT Short Term Goals - 04/11/18 1637      PT SHORT TERM GOAL #1   Title  Pt will be independent with HEP in order to improve strength and endurance in order to improve pain-free function    Time  4    Period  Weeks    Status  New        PT Long Term Goals - 06/05/18 1651      PT LONG TERM  GOAL #1   Title  Patient will increase FOTO score to 79 to demonstrate predicted increase in functional mobility to complete ADLs    Baseline  04/18/18    Time  8    Period  Weeks    Status  New      PT LONG TERM GOAL #2   Title  Patient will complete 12 or more STS in 30sec in order to demonstrate age matched LE endurance    Baseline  04/11/18 10    Time  8    Period  Weeks    Status  New      PT LONG TERM GOAL #3   Title   Pt will increase by at least 68m (138ft) in order to demonstrate clinically significant improvement in cardiopulmonary endurance and community ambulation    Baseline  06/05/18 758ft    Time  8    Period  Weeks    Status  New      PT LONG TERM GOAL #4   Title  Pt will increase by at least 0.13 m/s in order to demonstrate clinically significant improvement in community ambulation.     Baseline  06/05/18 0.23m/s    Time  8    Period  Weeks    Status  New            Plan - 06/11/18 1321    Clinical Impression Statement  Pt was late; session shortened accordingly. PT continued therex progression to increase standing tolerance and muscular endurance. Patient is increasing his standing tolerance which is an improvement    Rehab Potential  Fair    Clinical Impairments Affecting Rehab Potential  (-) sedentary lifestyle, age, other comorbidities (+) motivation    PT Frequency  2x / week    PT Duration  8 weeks    PT Treatment/Interventions  ADLs/Self Care Home Management;Cryotherapy;Traction;Gait training;Therapeutic exercise;Patient/family education;Manual techniques;Passive range of motion;Spinal Manipulations;Dry needling;Aquatic Therapy;Moist Heat;Electrical Stimulation;Ultrasound;Stair training;Functional mobility training;Neuromuscular re-education;Therapeutic activities;Balance training;Energy conservation    PT Next Visit Plan  endurance training    PT Home Exercise Plan  bridge, STS    Consulted and Agree with Plan of Care  Patient        Patient will benefit from skilled therapeutic intervention in order to improve the following deficits and impairments:  Abnormal gait, Decreased balance, Decreased endurance, Decreased mobility, Impaired sensation, Obesity, Improper body mechanics, Impaired tone, Decreased activity tolerance, Decreased strength, Impaired flexibility, Postural dysfunction, Pain  Visit Diagnosis: Muscle weakness (  generalized)     Problem List Patient Active Problem List   Diagnosis Date Noted  . Lung nodule 02/09/2016  . Exercise hypoxemia 12/28/2015  . Chronic obstructive pulmonary disease (HCC) 03/16/2015  . Nuclear sclerosis 03/16/2015  . OSA (obstructive sleep apnea) 03/16/2015  . Primary open angle glaucoma of both eyes, severe stage 03/16/2015  . Type 2 diabetes mellitus without complication, without long-term current use of insulin (HCC) 03/16/2015  . Arthralgia of left lower leg 10/29/2014  . Osteoarthritis of left knee 09/10/2014  . Type 2 diabetes mellitus with diabetic neuropathy (HCC) 08/04/2014  . Cataract, nuclear sclerotic senile 04/01/2014  . 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 Primary open angle glaucoma 02/27/2014  . Flat feet 01/29/2014  . Foot pain, bilateral 01/29/2014  . Tinea pedis of both feet 01/29/2014  . Ptosis of both eyelids 11/25/2013  . Type 2 diabetes mellitus without retinopathy (HCC) 11/25/2013  . Onychomycosis 11/19/2013  . Chronic systolic heart failure (HCC) 11/11/2013  . Emphysema of lung (HCC) 11/11/2013  . Essential hypertension 11/11/2013  . Screen for colon cancer 11/05/2013  . Cardiomyopathy (HCC) 10/08/2013  . Systolic HF (heart failure) (HCC) 10/08/2013  . Acute systolic CHF (congestive heart failure) (HCC) 09/16/2013  . COPD (chronic obstructive pulmonary disease) (HCC) 09/16/2013  . HTN (hypertension) 09/16/2013  . Obesity 09/16/2013  . PAF (paroxysmal atrial fibrillation) (HCC)  09/16/2013  . Senile nuclear sclerosis 05/22/2013  . Muscle weakness (generalized) 11/11/2009  . Long term current use of anticoagulant therapy 06/17/2009  . Pulmonary embolism (HCC) 06/17/2009  . ED (erectile dysfunction) of organic origin 05/13/2003   Staci Acostahelsea Miller PT, DPT Staci Acostahelsea Miller 06/11/2018, 1:46 PM  [facility] [address] [place], [place], [address] Phone: [phone]   Fax:  [phone]  Name: Mario [name] MRN: [id number] Date of Birth: Sep 30, 1945

## 2018-06-13 ENCOUNTER — Encounter: Payer: Self-pay | Admitting: Physical Therapy

## 2018-06-13 ENCOUNTER — Ambulatory Visit: Payer: Medicare Other | Admitting: Physical Therapy

## 2018-06-13 DIAGNOSIS — M6281 Muscle weakness (generalized): Secondary | ICD-10-CM | POA: Diagnosis not present

## 2018-06-13 NOTE — Therapy (Signed)
[facility] [address], [place], [address] Phone: [phone]   Fax:  [phone]  Physical Therapy Treatment  Patient Details  Name: Mario [name] MRN: [id number] Date of Birth: 20-Dec-1945 No data recorded  Encounter Date: 06/13/2018  PT End of Session - 06/13/18 1235    Visit Number  9    Number of Visits  17    Date for PT Re-Evaluation  08/01/18    PT Start Time  0945    PT Stop Time  1010    PT Time Calculation (min)  25 min    Activity Tolerance  Treatment limited secondary to medical complications (Comment)    Behavior During Therapy  [facility] for tasks assessed/performed       Past Medical History:  Diagnosis Date  . COPD (chronic obstructive pulmonary disease) (HCC)   . Diabetes mellitus without complication (HCC)   . Diabetic neuropathy (HCC)   . Hypertension     Past Surgical History:  Procedure Laterality Date  . COLONOSCOPY  08/30/2015  . KNEE SURGERY Right    screw in knee  . WRIST SURGERY Left    metal removed    There were no vitals filed for this visit.    Gait Training Over attempted multiple trials of gait not exceeding at a time because patient has decreased SPO2 within 1-2  Minutes of gait to 76%-88% despite cuing for energy conservation and pursed lip breathing. Final gait bout patient O2 dropped to 55% quickly and PT immediately sat patient down and increased O2 from 3L/min to 6L/min until O2 increased to 89% then lowered ( ). PT listened to lung sounds swith some wheezing detected and took BP. Patient reports he does feel increase fatigue, and he walked into  Clinic without O2 and "felt very tired". PT educated patient on improtance of monitoring O2 and keeping it in proper range for proper function. Patient reports he has had a runny nose since yesterday. PT advised patient to see MD for diagnosis and treatment of possible respiratory issues affecting  breathing.        BP 138/72               PT Education - 06/13/18 1235    Education Details  Energy conservation    Person(s) Educated  Patient    Methods  Explanation    Comprehension  Verbalized understanding       PT Short Term Goals - 04/11/18 1637      PT SHORT TERM GOAL #1   Title  Pt will be independent with HEP in order to improve strength and endurance in order to improve pain-free function    Time  4    Period  Weeks    Status  New        PT Long Term Goals - 06/05/18 1651      PT LONG TERM GOAL #1   Title  Patient will increase FOTO score to 79 to demonstrate predicted increase in functional mobility to complete ADLs    Baseline  04/18/18    Time  8    Period  Weeks    Status  New      PT LONG TERM GOAL #2   Title  Patient will complete 12 or more STS in 30sec in order to demonstrate age matched LE endurance    Baseline  04/11/18 10    Time  8    Period  Weeks  Status  New      PT LONG TERM GOAL #3   Title   Pt will increase 6MWT by at least 5849m (14564ft) in order to demonstrate clinically significant improvement in cardiopulmonary endurance and community ambulation    Baseline  06/05/18 74390ft    Time  8    Period  Weeks    Status  New      PT LONG TERM GOAL #4   Title  Pt will increase 10MWT by at least 0.13 m/s in order to demonstrate clinically significant improvement in community ambulation.     Baseline  06/05/18 0.553m/s    Time  8    Period  Weeks    Status  New            Plan - 06/13/18 1252    Clinical Impression Statement  PT attempted session with gait, which paptient is unable to complete with safety with normal oxygen range. PT monitored symptoms and educated patient on the importance of monitoring his O2 and symptoms, and ultimately advised patient to seek MD attention as his O2 is unable to stay within normal limites with any ambulation.     Rehab Potential  Fair    Clinical Impairments Affecting Rehab Potential   (-) sedentary lifestyle, age, other comorbidities (+) motivation    PT Frequency  2x / week    PT Duration  8 weeks    PT Treatment/Interventions  ADLs/Self Care Home Management;Cryotherapy;Traction;Gait training;Therapeutic exercise;Patient/family education;Manual techniques;Passive range of motion;Spinal Manipulations;Dry needling;Aquatic Therapy;Moist Heat;Electrical Stimulation;Ultrasound;Stair training;Functional mobility training;Neuromuscular re-education;Therapeutic activities;Balance training;Energy conservation    PT Next Visit Plan  endurance training    PT Home Exercise Plan  bridge, STS    Consulted and Agree with Plan of Care  Patient       Patient will benefit from skilled therapeutic intervention in order to improve the following deficits and impairments:  Abnormal gait, Decreased balance, Decreased endurance, Decreased mobility, Impaired sensation, Obesity, Improper body mechanics, Impaired tone, Decreased activity tolerance, Decreased strength, Impaired flexibility, Postural dysfunction, Pain  Visit Diagnosis: Muscle weakness (generalized)     Problem List Patient Active Problem List   Diagnosis Date Noted  . Lung nodule 02/09/2016  . Exercise hypoxemia 12/28/2015  . Chronic obstructive pulmonary disease (HCC) 03/16/2015  . Nuclear sclerosis 03/16/2015  . OSA (obstructive sleep apnea) 03/16/2015  . Primary open angle glaucoma of both eyes, severe stage 03/16/2015  . Type 2 diabetes mellitus without complication, without long-term current use of insulin (HCC) 03/16/2015  . Arthralgia of left lower leg 10/29/2014  . Osteoarthritis of left knee 09/10/2014  . Type 2 diabetes mellitus with diabetic neuropathy (HCC) 08/04/2014  . Cataract, nuclear sclerotic senile 04/01/2014  . 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 Primary open angle glaucoma 02/27/2014  . Flat feet 01/29/2014  . Foot pain, bilateral 01/29/2014   . Tinea pedis of both feet 01/29/2014  . Ptosis of both eyelids 11/25/2013  . Type 2 diabetes mellitus without retinopathy (HCC) 11/25/2013  . Onychomycosis 11/19/2013  . Chronic systolic heart failure (HCC) 11/11/2013  . Emphysema of lung (HCC) 11/11/2013  . Essential hypertension 11/11/2013  . Screen for colon cancer 11/05/2013  . Cardiomyopathy (HCC) 10/08/2013  . Systolic HF (heart failure) (HCC) 10/08/2013  . Acute systolic CHF (congestive heart failure) (HCC) 09/16/2013  . COPD (chronic obstructive pulmonary disease) (HCC) 09/16/2013  . HTN (hypertension) 09/16/2013  . Obesity 09/16/2013  . PAF (paroxysmal atrial fibrillation) (  HCC) 09/16/2013  . Senile nuclear sclerosis 05/22/2013  . Muscle weakness (generalized) 11/11/2009  . Long term current use of anticoagulant therapy 06/17/2009  . Pulmonary embolism (HCC) 06/17/2009  . ED (erectile dysfunction) of organic origin 05/13/2003   Staci Acostahelsea Miller PT, DPT Staci Acostahelsea Miller 06/13/2018, 1:07 PM  [facility] [address] , [place], [address] Phone: [phone]   Fax:  [phone]  Name: Mario [name] MRN: [id number] Date of Birth: 1946/04/01

## 2018-06-18 ENCOUNTER — Ambulatory Visit: Payer: Medicare Other | Admitting: Physical Therapy

## 2018-06-21 ENCOUNTER — Ambulatory Visit: Payer: Medicare Other | Admitting: Physical Therapy

## 2018-06-25 ENCOUNTER — Encounter: Payer: Self-pay | Admitting: Physical Therapy

## 2018-06-25 ENCOUNTER — Ambulatory Visit: Payer: Medicare Other | Admitting: Physical Therapy

## 2018-06-25 DIAGNOSIS — M6281 Muscle weakness (generalized): Secondary | ICD-10-CM | POA: Diagnosis not present

## 2018-06-25 NOTE — Therapy (Addendum)
[facility] PHYS[facility]ORTS MEDICINE [address] [place], [address] Phone: [phone]   Fax:  [phone]  Physical Therapy Treatment/Progress Note Reporting Period 04/12/19 - 06/25/18  Patient Details  Name: Christianjacob Mccredie MRN: [id number] Date of Birth: [birth year] No data recorded  Encounter Date: 06/25/2018  PT End of Session - 06/25/18 1356    Visit Number  10    Number of Visits  17    Date for PT Re-Evaluation  08/01/18    PT Start Time  0145    PT Stop Time  0230    PT Time Calculation (min)  45 min    Activity Tolerance  Treatment limited secondary to medical complications (Comment)    Behavior During Therapy  [facility] for tasks assessed/performed       Past Medical History:  Diagnosis Date  . COPD (chronic obstructive pulmonary disease) (HCC)   . Diabetes mellitus without complication (HCC)   . Diabetic neuropathy (HCC)   . Hypertension     Past Surgical History:  Procedure Laterality Date  . COLONOSCOPY  08/30/2015  . KNEE SURGERY Right    screw in knee  . WRIST SURGERY Left    metal removed    There were no vitals filed for this visit.  Subjective Assessment - 06/25/18 1353    Subjective  Patient reports he feels much better since last visit after getting back on his fluid pill, reporting that "his pill packaging" had been missing this. Patient reports since this he has     Pertinent History  Patient is a 73 year old male presenting with bilat LE fatigue for over the past year. Patient denies LBP. Patient has history of COPD  and reports 02 use when he sleeps and "as he needs it" on 2L. Patient reports he has gone to cardiopulmonary rehab to "help his lungs" and "when he was going to the pool he felt great". Patient notes that when he is standing/walking for any period of time he feels like his "legs get tired" before his breathing becomes fatiguing. Patient reports he checks his 02 often and he is able to keep this  above 88 wit pursed lip breathing, but that his "legs get tired". Reports deficitis in prolonged standing/walking, and bending. Has neuropathy in biilat hands/feet, denies tingling/electrical sensation down LE. N/V, unexplained weight fluctuation, B&B changes, saddle paresthesia, fever, night sweats, or unrelenting night pain at this time.    Limitations  Lifting;Walking;House hold activities    How long can you sit comfortably?  unlimited    How long can you stand comfortably?     How long can you walk comfortably?     Diagnostic tests  MRI lumbar spine foraminal stenosis     Patient Stated Goals  Be able to complete ADLs without pain    Pain Onset  1 to 4 weeks ago       Ther-Ex -Nustep L28min; L4 (patient reports he cannot continue at L5)SPM over 70,for increased resistance with endurance, activity tolerance, intermittent assessment of HR and spO2 (90% or above) - 30sec STS test over 2 trials first trial 11 second trial 12 min cuing for full hip ext with full stand - over 2 trials best time 8.6sec ; 1.88m/s - 71ft with 2 seated rest breaks with O2 dropping to 85% and min cuing for breathing for energy conservation;  - Patient discussed with PT that he spoke to is doctor and he  may want to return to cardiopulmonary rehab and wanted PT thoughts on this. PT educated patient that as his strength has improved, his remaining deficits are more COPD based, this would not be a bad idea. PT encouraged patient tat to complete cardiopulmonary rehab he would need to be consistent with attendance to see results - Reviewed the following HEP exercises with PT with education on parameters for strengthening vs endurance: bridge, STS w/o UE support, standing heel raises, standing hip abd and ext                       PT Education - 06/25/18 1356    Education Details  Exercise form; vital monitoring; cardiopulm rehab education    Person(s) Educated  Patient     Methods  Explanation;Demonstration;Tactile cues;Verbal cues    Comprehension  Verbalized understanding;Returned demonstration;Verbal cues required;Tactile cues required       PT Short Term Goals - 06/25/18 1420      PT SHORT TERM GOAL #1   Title  Pt will be independent with HEP in order to improve strength and endurance in order to improve pain-free function    Time  4    Period  Weeks    Status  Achieved        PT Long Term Goals - 06/25/18 1358      PT LONG TERM GOAL #1   Title  Patient will increase FOTO score to 79 to demonstrate predicted increase in functional mobility to complete ADLs    Baseline  06/25/18    Time  8    Period  Weeks      PT LONG TERM GOAL #2   Title  Patient will complete 12 or more STS in 30sec in order to demonstrate age matched LE endurance    Baseline  06/25/18 12    Time  8    Period  Weeks    Status  Achieved      PT LONG TERM GOAL #3   Title   Pt will increase by at least 27m (168ft) in order to demonstrate clinically significant improvement in cardiopulmonary endurance and community ambulation    Baseline  06/25/18 721ft    Time  8    Period  Weeks    Status  On-going      PT LONG TERM GOAL #4   Title  Pt will increase by at least 0.13 m/s in order to demonstrate clinically significant improvement in community ambulation.     Baseline  06/25/18 1.34m/s    Time  8    Period  Weeks    Status  Achieved            Plan - 06/25/18 1812    Clinical Impression Statement  PT reassessed patient goals this visit, where patient is making gains in LE endurance over shorter distances, and increasing speed/power. Patient with remaining deficits in long distance walking, which is mostly COPD dependent, as patietn does not feel "weak in legs", but more importantly has O2 sats that drop below 88% and he has to rest and complete pursed lip breathing to raise oxygen to normal rate. Patient discussed possibly continuing therapy at a  cardiopulmonary program, which PT agrees may be better suit for patient to consistently monitor vitals and work on longer distance/time endurance as he is making good progress with strength and power through PT here. Patient willl follow up with PT following his physical with PCP on Thursday.  Rehab Potential  Fair    Clinical Impairments Affecting Rehab Potential  (-) sedentary lifestyle, age, other comorbidities (+) motivation    PT Frequency  2x / week    PT Duration  8 weeks    PT Treatment/Interventions  ADLs/Self Care Home Management;Cryotherapy;Traction;Gait training;Therapeutic exercise;Patient/family education;Manual techniques;Passive range of motion;Spinal Manipulations;Dry needling;Aquatic Therapy;Moist Heat;Electrical Stimulation;Ultrasound;Stair training;Functional mobility training;Neuromuscular re-education;Therapeutic activities;Balance training;Energy conservation    PT Next Visit Plan  endurance training    PT Home Exercise Plan   bridge, STS w/o UE support, standing heel raises, standing hip abd and ext    Consulted and Agree with Plan of Care  Patient       Patient will benefit from skilled therapeutic intervention in order to improve the following deficits and impairments:  Abnormal gait, Decreased balance, Decreased endurance, Decreased mobility, Impaired sensation, Obesity, Improper body mechanics, Impaired tone, Decreased activity tolerance, Decreased strength, Impaired flexibility, Postural dysfunction, Pain  Visit Diagnosis: Muscle weakness (generalized)     Problem List Patient Active Problem List   Diagnosis Date Noted  . Lung nodule 02/09/2016  . Exercise hypoxemia 12/28/2015  . Chronic obstructive pulmonary disease (HCC) 03/16/2015  . Nuclear sclerosis 03/16/2015  . OSA (obstructive sleep apnea) 03/16/2015  . Primary open angle glaucoma of both eyes, severe stage 03/16/2015  . Type 2 diabetes mellitus without complication, without long-term current use  of insulin (HCC) 03/16/2015  . Arthralgia of left lower leg 10/29/2014  . Osteoarthritis of left knee 09/10/2014  . Type 2 diabetes mellitus with diabetic neuropathy (HCC) 08/04/2014  . Cataract, nuclear sclerotic senile 04/01/2014  . 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 Primary open angle glaucoma 02/27/2014  . Flat feet 01/29/2014  . Foot pain, bilateral 01/29/2014  . Tinea pedis of both feet 01/29/2014  . Ptosis of both eyelids 11/25/2013  . Type 2 diabetes mellitus without retinopathy (HCC) 11/25/2013  . Onychomycosis 11/19/2013  . Chronic systolic heart failure (HCC) 11/11/2013  . Emphysema of lung (HCC) 11/11/2013  . Essential hypertension 11/11/2013  . Screen for colon cancer 11/05/2013  . Cardiomyopathy (HCC) 10/08/2013  . Systolic HF (heart failure) (HCC) 10/08/2013  . Acute systolic CHF (congestive heart failure) (HCC) 09/16/2013  . COPD (chronic obstructive pulmonary disease) (HCC) 09/16/2013  . HTN (hypertension) 09/16/2013  . Obesity 09/16/2013  . PAF (paroxysmal atrial fibrillation) (HCC) 09/16/2013  . Senile nuclear sclerosis 05/22/2013  . Muscle weakness (generalized) 11/11/2009  . Long term current use of anticoagulant therapy 06/17/2009  . Pulmonary embolism (HCC) 06/17/2009  . ED (erectile dysfunction) of organic origin 05/13/2003   Staci Acosta PT, DPT Staci Acosta 06/25/2018, 6:17 PM  [facility] PHYS[facility] AND SPORTS MEDICINE [address], [place], [address] Phone: [phone]   Fax:  [phone]  Name: Aimon Bernick MRN: [id number] Date of Birth: [birth year]

## 2018-06-28 ENCOUNTER — Ambulatory Visit: Payer: Medicare Other | Admitting: Physical Therapy

## 2018-07-02 ENCOUNTER — Ambulatory Visit: Payer: Medicare Other | Admitting: Physical Therapy

## 2018-07-04 ENCOUNTER — Ambulatory Visit: Payer: Medicare Other | Admitting: Physical Therapy

## 2018-07-08 ENCOUNTER — Ambulatory Visit: Payer: Medicare Other | Admitting: Physical Therapy

## 2018-07-11 ENCOUNTER — Ambulatory Visit: Payer: Medicare Other | Admitting: Physical Therapy

## 2018-11-29 ENCOUNTER — Other Ambulatory Visit: Payer: Self-pay

## 2018-11-29 ENCOUNTER — Ambulatory Visit
Admission: EM | Admit: 2018-11-29 | Discharge: 2018-11-29 | Disposition: A | Discharge status: Home / Self Care | Payer: Medicare Other | Attending: Family Medicine | Admitting: Family Medicine

## 2018-11-29 DIAGNOSIS — M79642 Pain in left hand: Secondary | ICD-10-CM | POA: Diagnosis not present

## 2018-11-29 DIAGNOSIS — G5622 Lesion of ulnar nerve, left upper limb: Secondary | ICD-10-CM

## 2018-11-29 NOTE — ED Triage Notes (Signed)
Patient complains of left wrist pain that started months ago. Patient reports that he has numbness and tingling in his left hand. Patient states that he was told by his PCP that this is due to his age and he didn't appreciate that. Patient feels like something should be done for his hand.

## 2018-11-29 NOTE — ED Provider Notes (Signed)
[facility]    CSN: [id number] Arrival date & time: 11/29/18  1021      History   Chief Complaint Chief Complaint  Patient presents with  . Hand Pain    left   HPI  73 year old male presents with the above complaint.  This is a chronic issue and has been going on for months.  Patient reports left hand pain, numbness, tingling particularly of the fourth and fifth digits.  Seems to start more proximal and radiates downward.  Patient has been seen by his primary care physician, neurology, and physiatry.  I have reviewed the notes.  Patient has known diabetic neuropathy.  There is mention from physiatry about ulnar neuropathy.  Patient also has known cervical disc disease.  Patient continues to be bothered by these symptoms.  He states that his pain is severe.  He is currently on Cymbalta and gabapentin.  He states that these medications make him sedated.  Patient is looking for relief in his pain.  No other reported symptoms.  No other complaints or concerns at this time.  History reviewed and updated as below.  PMH: Patient Active Problem List   Diagnosis Date Noted  . Lung nodule 02/09/2016  . Exercise hypoxemia 12/28/2015  . Chronic obstructive pulmonary disease (HCC) 03/16/2015  . Nuclear sclerosis 03/16/2015  . OSA (obstructive sleep apnea) 03/16/2015  . Primary open angle glaucoma of both eyes, severe stage 03/16/2015  . Type 2 diabetes mellitus without complication, without long-term current use of insulin (HCC) 03/16/2015  . Arthralgia of left lower leg 10/29/2014  . Osteoarthritis of left knee 09/10/2014  . Type 2 diabetes mellitus with diabetic neuropathy (HCC) 08/04/2014  . Cataract, nuclear sclerotic senile 04/01/2014  . 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 Primary open angle glaucoma 02/27/2014  . Flat feet 01/29/2014  . Foot pain, bilateral 01/29/2014  . Tinea pedis of both feet 01/29/2014  .  Ptosis of both eyelids 11/25/2013  . Type 2 diabetes mellitus without retinopathy (HCC) 11/25/2013  . Onychomycosis 11/19/2013  . Chronic systolic heart failure (HCC) 11/11/2013  . Emphysema of lung (HCC) 11/11/2013  . Essential hypertension 11/11/2013  . Screen for colon cancer 11/05/2013  . Cardiomyopathy (HCC) 10/08/2013  . Systolic HF (heart failure) (HCC) 10/08/2013  . Acute systolic CHF (congestive heart failure) (HCC) 09/16/2013  . COPD (chronic obstructive pulmonary disease) (HCC) 09/16/2013  . HTN (hypertension) 09/16/2013  . Obesity 09/16/2013  . PAF (paroxysmal atrial fibrillation) (HCC) 09/16/2013  . Senile nuclear sclerosis 05/22/2013  . Muscle weakness (generalized) 11/11/2009  . Long term current use of anticoagulant therapy 06/17/2009  . Pulmonary embolism (HCC) 06/17/2009  . 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 COLONOSCOPY  08/30/2015  . KNEE SURGERY Right    screw in knee  . WRIST SURGERY Left    metal removed   Home Medications    Prior to Admission medications   Medication Sig Start Date End Date Taking? Authorizing Provider  albuterol (PROVENTIL HFA;VENTOLIN HFA) 108 (90 Base) MCG/ACT inhaler Inhale 2 puffs into the lungs as needed. 11/23/15  Yes [provider]  amLODipine (NORVASC) 5 MG tablet Take 1 tablet by mouth daily. 07/02/15  Yes [provider]  celecoxib (CELEBREX) 200 MG capsule  11/17/18  Yes [provider]  dorzolamide (TRUSOPT) 2 % ophthalmic solution INT 1 GTT IN OU BID 11/17/18  Yes [provider]  DULoxetine (CYMBALTA) 60 MG capsule  11/10/18  Yes [provider]  edoxaban (SAVAYSA) 60 MG TABS tablet Take 60 mg by mouth daily. cardiology 07/02/15  Yes [provider]  fluticasone-salmeterol (ADVAIR HFA) 230-21 MCG/ACT inhaler Inhale 2 puffs into the lungs 2 (two) times daily. 11/15/15  Yes [provider]   furosemide (LASIX) 80 MG tablet Take 1 tablet by mouth daily. 08/23/15  Yes [provider]  gabapentin (NEURONTIN) 300 MG capsule Take 1 capsule by mouth 3 (three) times daily. 07/17/15  Yes [provider]  glucose blood (ONE TOUCH ULTRA TEST) test strip Use as instructed FOR TESTING three times daily.  E11.9 06/01/15  Yes [provider]  ipratropium-albuterol (DUONEB) 0.5-2.5 (3) MG/3ML SOLN Inhale into the lungs. 12/28/15  Yes [provider]  metFORMIN (GLUCOPHAGE) 1000 MG tablet Take 1 tablet by mouth 2 (two) times daily. 08/12/15  Yes [provider]  OZEMPIC, 1 MG/DOSE, 2 MG/1.5ML SOPN  11/20/18  Yes [provider]  rosuvastatin (CRESTOR) 5 MG tablet  11/10/18  Yes [provider]  sotalol (BETAPACE) 80 MG tablet Take 1 tablet by mouth 2 (two) times daily. 08/25/14  Yes [provider]  terazosin (HYTRIN) 5 MG capsule Take 1 capsule by mouth every other day. 06/08/14  Yes [provider]  losartan (COZAAR) 50 MG tablet Take 1 tablet by mouth daily. 07/05/15 11/29/18  [provider]   Social History Social History   Tobacco Use  . Smoking status: Former Smoker    Packs/day: 0.50    Years: 1.00    Pack years: 0.50    Types: Cigarettes    Quit date: 05/01/1969    Years since quitting: 49.6  . Smokeless tobacco: Never Used  Substance Use Topics  . Alcohol use: No  . Drug use: No     Allergies   Ace inhibitors   Review of Systems Review of Systems  Musculoskeletal:       Left hand pain.  Neurological: Positive for numbness.       Tingling.   Physical Exam Triage Vital Signs ED Triage Vitals  Enc Vitals Group     BP 11/29/18 1039 119/79     Pulse Rate 11/29/18 1039 83     Resp 11/29/18 1039 16     Temp 11/29/18 1039 98.7 F (37.1 C)     Temp Source 11/29/18 1039 Oral     SpO2 11/29/18 1039 92 %     Weight 11/29/18 1034 248 lb (112.5 kg)     Height 11/29/18 1034 5\' 10"  (1.778 m)      Head Circumference --      Peak Flow --      Pain Score 11/29/18 1033 10     Pain Loc --      Pain Edu? --      Excl. in GC? --    Updated Vital Signs BP 119/79 (BP Location: Right Arm)   Pulse 83   Temp 98.7 F (37.1 C) (Oral)   Resp 16   Ht 5\' 10"  (1.778 m)   Wt 112.5 kg   SpO2 92% Comment: states that he is normally on oxygen but did not want to wear it in here.  BMI 35.58 kg/m   Visual Acuity Right Eye Distance:   Left Eye Distance:   Bilateral Distance:    Right Eye Near:   Left Eye Near:    Bilateral Near:     Physical Exam Vitals  signs and nursing note reviewed.  Constitutional:      General: He is not in acute distress.    Appearance: Normal appearance.  HENT:     Head: Normocephalic and atraumatic.  Eyes:     General:        Right eye: No discharge.        Left eye: No discharge.     Conjunctiva/sclera: Conjunctivae normal.  Pulmonary:     Effort: Pulmonary effort is normal. No respiratory distress.  Musculoskeletal:     Comments: Left hand -patient endorsing numbness, tingling of the fourth and fifth digits.  Normal-appearing hand.  No bruising.  No appreciable swelling.  Neurological:     Mental Status: He is alert.  Psychiatric:        Mood and Affect: Mood normal.        Behavior: Behavior normal.    UC Treatments / Results  Labs (all labs ordered are listed, but only abnormal results are displayed) Labs Reviewed - No data to display  EKG   Radiology No results found.  Procedures Procedures (including critical care time)  Medications Ordered in UC Medications - No data to display  Initial Impression / Assessment and Plan / UC Course  I have reviewed the triage vital signs and the nursing notes.  Pertinent labs & imaging results that were available during my care of the patient were reviewed by me and considered in my medical decision making (see chart for details).    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  I  have advised him to see orthopedics for discussion about possible injection.  Continue Cymbalta and gabapentin.  Needs close follow-up.  Final Clinical Impressions(s) / UC Diagnoses   Final diagnoses:  Ulnar neuropathy of left upper extremity     Discharge Instructions     Continue your home medications.  See Dr. Prescott Parma to discuss elbow injection.  Take care  Dr. Lacinda Axon    ED Prescriptions    None     Controlled Substance Prescriptions [place] Controlled Substance Registry consulted? Not Applicable   Coral Spikes, DO 11/29/18 1135

## 2018-11-29 NOTE — Discharge Instructions (Signed)
Continue your home medications.  See Dr. Prescott Parma to discuss elbow injection.  Take care  Dr. Lacinda Axon

## 2019-05-06 ENCOUNTER — Other Ambulatory Visit: Payer: Self-pay

## 2019-05-06 ENCOUNTER — Encounter: Payer: Self-pay | Admitting: Ophthalmology

## 2019-05-08 ENCOUNTER — Other Ambulatory Visit: Payer: Medicare Other

## 2019-05-08 NOTE — Discharge Instructions (Signed)

## 2019-05-09 ENCOUNTER — Other Ambulatory Visit: Payer: Self-pay

## 2019-05-09 ENCOUNTER — Other Ambulatory Visit
Admission: RE | Admit: 2019-05-09 | Discharge: 2019-05-09 | Disposition: A | Discharge status: Home / Self Care | Payer: Medicare Other | Source: Ambulatory Visit | Attending: Ophthalmology | Admitting: Ophthalmology

## 2019-05-09 DIAGNOSIS — Z20822 Contact with and (suspected) exposure to covid-19: Secondary | ICD-10-CM | POA: Insufficient documentation

## 2019-05-09 DIAGNOSIS — Z01812 Encounter for preprocedural laboratory examination: Secondary | ICD-10-CM | POA: Diagnosis present

## 2019-05-09 LAB — SARS CORONAVIRUS 2 (TAT 6-24 HRS): SARS Coronavirus 2: NEGATIVE

## 2019-05-12 ENCOUNTER — Encounter: Payer: Self-pay | Admitting: Ophthalmology

## 2019-05-12 ENCOUNTER — Ambulatory Visit
Admission: RE | Admit: 2019-05-12 | Discharge: 2019-05-12 | Disposition: A | Discharge status: Home / Self Care | Payer: Medicare Other | Attending: Ophthalmology | Admitting: Ophthalmology

## 2019-05-12 ENCOUNTER — Ambulatory Visit: Payer: Medicare Other | Admitting: Anesthesiology

## 2019-05-12 ENCOUNTER — Encounter
Admission: RE | Disposition: A | Discharge status: Home / Self Care | Payer: Self-pay | Source: Home / Self Care | Attending: Ophthalmology

## 2019-05-12 ENCOUNTER — Other Ambulatory Visit: Payer: Self-pay

## 2019-05-12 DIAGNOSIS — E1136 Type 2 diabetes mellitus with diabetic cataract: Secondary | ICD-10-CM | POA: Diagnosis not present

## 2019-05-12 DIAGNOSIS — M1990 Unspecified osteoarthritis, unspecified site: Secondary | ICD-10-CM | POA: Insufficient documentation

## 2019-05-12 DIAGNOSIS — E1139 Type 2 diabetes mellitus with other diabetic ophthalmic complication: Secondary | ICD-10-CM | POA: Insufficient documentation

## 2019-05-12 DIAGNOSIS — R011 Cardiac murmur, unspecified: Secondary | ICD-10-CM | POA: Diagnosis not present

## 2019-05-12 DIAGNOSIS — Z9981 Dependence on supplemental oxygen: Secondary | ICD-10-CM | POA: Insufficient documentation

## 2019-05-12 DIAGNOSIS — J449 Chronic obstructive pulmonary disease, unspecified: Secondary | ICD-10-CM | POA: Diagnosis not present

## 2019-05-12 DIAGNOSIS — G4730 Sleep apnea, unspecified: Secondary | ICD-10-CM | POA: Insufficient documentation

## 2019-05-12 DIAGNOSIS — H2512 Age-related nuclear cataract, left eye: Secondary | ICD-10-CM | POA: Insufficient documentation

## 2019-05-12 DIAGNOSIS — Z7984 Long term (current) use of oral hypoglycemic drugs: Secondary | ICD-10-CM | POA: Insufficient documentation

## 2019-05-12 DIAGNOSIS — I110 Hypertensive heart disease with heart failure: Secondary | ICD-10-CM | POA: Diagnosis not present

## 2019-05-12 DIAGNOSIS — I4891 Unspecified atrial fibrillation: Secondary | ICD-10-CM | POA: Insufficient documentation

## 2019-05-12 DIAGNOSIS — I509 Heart failure, unspecified: Secondary | ICD-10-CM | POA: Insufficient documentation

## 2019-05-12 DIAGNOSIS — Z888 Allergy status to other drugs, medicaments and biological substances status: Secondary | ICD-10-CM | POA: Diagnosis not present

## 2019-05-12 DIAGNOSIS — Z86711 Personal history of pulmonary embolism: Secondary | ICD-10-CM | POA: Diagnosis not present

## 2019-05-12 DIAGNOSIS — H401122 Primary open-angle glaucoma, left eye, moderate stage: Secondary | ICD-10-CM | POA: Diagnosis not present

## 2019-05-12 DIAGNOSIS — Z87891 Personal history of nicotine dependence: Secondary | ICD-10-CM | POA: Diagnosis not present

## 2019-05-12 HISTORY — DX: Other pulmonary embolism without acute cor pulmonale: I26.99

## 2019-05-12 HISTORY — DX: Unspecified osteoarthritis, unspecified site: M19.90

## 2019-05-12 HISTORY — DX: Heart failure, unspecified: I50.9

## 2019-05-12 HISTORY — DX: Cardiac murmur, unspecified: R01.1

## 2019-05-12 HISTORY — DX: Sleep apnea, unspecified: G47.30

## 2019-05-12 HISTORY — DX: Polyneuropathy, unspecified: G62.9

## 2019-05-12 HISTORY — DX: Presence of dental prosthetic device (complete) (partial): Z97.2

## 2019-05-12 HISTORY — PX: CATARACT EXTRACTION W/PHACO: SHX586

## 2019-05-12 HISTORY — DX: Unspecified atrial fibrillation: I48.91

## 2019-05-12 LAB — GLUCOSE, CAPILLARY
Glucose-Capillary: 138 mg/dL — ABNORMAL HIGH (ref 70–99)
Glucose-Capillary: 134 mg/dL — ABNORMAL HIGH (ref 70–99)

## 2019-05-12 SURGERY — PHACOEMULSIFICATION, CATARACT, WITH IOL INSERTION
Anesthesia: Monitor Anesthesia Care | Site: Eye | Laterality: Left

## 2019-05-12 MED ORDER — ARMC OPHTHALMIC DILATING DROPS
1.0000 "application " | OPHTHALMIC | Status: DC | PRN
  Administered 2019-05-12 (×3): 1 via OPHTHALMIC

## 2019-05-12 MED ORDER — SODIUM HYALURONATE 10 MG/ML IO SOLN
INTRAOCULAR | Status: DC | PRN
  Administered 2019-05-12: 0.55 mL via INTRAOCULAR

## 2019-05-12 MED ORDER — EPINEPHRINE PF 1 MG/ML IJ SOLN
INTRAOCULAR | Status: DC | PRN
  Administered 2019-05-12: 10:00:00 78 mL via OPHTHALMIC

## 2019-05-12 MED ORDER — ACETAMINOPHEN 325 MG PO TABS: 325.0000 mg | ORAL_TABLET | ORAL | Status: DC | PRN

## 2019-05-12 MED ORDER — TETRACAINE HCL 0.5 % OP SOLN
1.0000 [drp] | OPHTHALMIC | Status: DC | PRN
  Administered 2019-05-12 (×3): 1 [drp] via OPHTHALMIC

## 2019-05-12 MED ORDER — MIDAZOLAM HCL 2 MG/2ML IJ SOLN
INTRAMUSCULAR | Status: DC | PRN
  Administered 2019-05-12: 1 mg via INTRAVENOUS
  Administered 2019-05-12 (×2): .5 mg via INTRAVENOUS

## 2019-05-12 MED ORDER — FENTANYL CITRATE (PF) 100 MCG/2ML IJ SOLN
INTRAMUSCULAR | Status: DC | PRN
  Administered 2019-05-12 (×2): 25 ug via INTRAVENOUS

## 2019-05-12 MED ORDER — LIDOCAINE HCL (PF) 2 % IJ SOLN
INTRAOCULAR | Status: DC | PRN
  Administered 2019-05-12: 1 mL via INTRAOCULAR

## 2019-05-12 MED ORDER — MOXIFLOXACIN HCL 0.5 % OP SOLN
OPHTHALMIC | Status: DC | PRN
  Administered 2019-05-12: 0.2 mL via OPHTHALMIC

## 2019-05-12 MED ORDER — ONDANSETRON HCL 4 MG/2ML IJ SOLN: 4.0000 mg | Freq: Once | INTRAMUSCULAR | Status: DC | PRN

## 2019-05-12 MED ORDER — ACETAMINOPHEN 160 MG/5ML PO SOLN: 325.0000 mg | ORAL | Status: DC | PRN

## 2019-05-12 MED ORDER — SODIUM HYALURONATE 23 MG/ML IO SOLN
INTRAOCULAR | Status: DC | PRN
  Administered 2019-05-12: 0.6 mL via INTRAOCULAR

## 2019-05-12 SURGICAL SUPPLY — 22 items
CANNULA ANT/CHMB 27G (MISCELLANEOUS) ×2 IMPLANT
CANNULA ANT/CHMB 27GA (MISCELLANEOUS) ×6 IMPLANT
DEVICE INJECT ISTENT W (Stent) IMPLANT
DISSECTOR HYDRO NUCLEUS 50X22 (MISCELLANEOUS) ×3 IMPLANT
GLOVE SURG LX 7.5 STRW (GLOVE) ×2
GLOVE SURG LX STRL 7.5 STRW (GLOVE) ×1 IMPLANT
GLOVE SURG SYN 8.5  E (GLOVE) ×2
GLOVE SURG SYN 8.5 E (GLOVE) ×1 IMPLANT
GLOVE SURG SYN 8.5 PF PI (GLOVE) ×1 IMPLANT
GOWN STRL REUS W/ TWL LRG LVL3 (GOWN DISPOSABLE) ×2 IMPLANT
GOWN STRL REUS W/TWL LRG LVL3 (GOWN DISPOSABLE) ×4
ICLIP (OPHTHALMIC RELATED) ×2 IMPLANT
INJECT ISTENT W (Stent) ×3 IMPLANT
LENS IOL TECNIS ITEC 13.0 (Intraocular Lens) ×2 IMPLANT
MARKER SKIN DUAL TIP RULER LAB (MISCELLANEOUS) ×3 IMPLANT
PACK DR. KING ARMS (PACKS) ×3 IMPLANT
PACK EYE AFTER SURG (MISCELLANEOUS) ×3 IMPLANT
PACK OPTHALMIC (MISCELLANEOUS) ×3 IMPLANT
SYR 3ML LL SCALE MARK (SYRINGE) ×3 IMPLANT
SYR TB 1ML LUER SLIP (SYRINGE) ×3 IMPLANT
WATER STERILE IRR 250ML POUR (IV SOLUTION) ×3 IMPLANT
WIPE NON LINTING 3.25X3.25 (MISCELLANEOUS) ×3 IMPLANT

## 2019-05-12 NOTE — Anesthesia Postprocedure Evaluation (Signed)
Anesthesia Post Note  Patient: Mario [name]  Procedure(s) Performed: CATARACT EXTRACTION PHACO AND INTRAOCULAR LENS PLACEMENT (IOC) LEFTDIABETIC ISTENT INJ 2.43  00:28.6 (Left Eye)     Patient location during evaluation: PACU Anesthesia Type: MAC Level of consciousness: awake and alert Pain management: pain level controlled Vital Signs Assessment: post-procedure vital signs reviewed and stable Respiratory status: spontaneous breathing, nonlabored ventilation, respiratory function stable and patient connected to nasal cannula oxygen Cardiovascular status: stable and blood pressure returned to baseline Postop Assessment: no apparent nausea or vomiting Anesthetic complications: no    Alisa Graff

## 2019-05-12 NOTE — Op Note (Signed)
OPERATIVE NOTE  Mario [name] [id number] 05/12/2019  PREOPERATIVE DIAGNOSIS:   1.  Moderate PRIMARY open angle glaucoma, left  eye.  E15.8309 2.  Nuclear sclerotic cataract left eye.  H25.12   POSTOPERATIVE DIAGNOSIS:    same.   PROCEDURE:   1.  Placement of trabecular bypass stent (istent). CPT 0191T  and placement of additional stent  CPT 0376T 2.  Phacoemusification with posterior chamber intraocular lens placement of the right eye  CPT [phone]   LENS: Implant Name Type Inv. Item Serial No. Manufacturer Lot No. LRB No. Used Action  Marko Stai - [id number] Stent Marko Stai [id number] [employer]  Left 1 Implanted  LENS IOL DIOP 13.0 - [id number] Intraocular Lens LENS IOL DIOP 13.0 [id number] AMO  Left 1 Implanted      Procedure(s) with comments: CATARACT EXTRACTION PHACO AND INTRAOCULAR LENS PLACEMENT (IOC) LEFTDIABETIC ISTENT INJ 2.43  00:28.6 (Left) - Diabetic - oral meds sleep apnea  PCB00 +13.0  Other implanted device:  [employer] Istent inject W   ULTRASOUND TIME: 0 minutes 28 seconds.  CDE 2.43   SURGEON:  Benay Pillow, MD, MPH  ANESTHESIOLOGIST: Anesthesiologist: Alisa Graff, MD CRNA: Vanetta Shawl, CRNA   ANESTHESIA:  MAC and intracameral preservative-free intracameral lidocaine 4%.  ESTIMATED BLOOD LOSS: less than 1 mL.   COMPLICATIONS:  None.   DESCRIPTION OF PROCEDURE:  The patient was identified in the holding room and transported to the operating room.   The patient was placed in the supine position under the operating microscope.   A 1.0 millimeter clear-corneal paracentesis was made at the 4:30 position. 0.5 ml of preservative-free 1% lidocaine with epinephrine was injected into the anterior chamber.  The anterior chamber was filled with Healon 5 viscoelastic.  A 2.4 millimeter keratome was used to make a near-clear corneal incision at the 2:00 position.   Attention was turned to the istent.  The patients head was turned to the  left and the microscope was tilted to 035 degrees.  Ocular instruments/Glaukos OAL/H2 gonioprism was used with IPC05 (iclip) coupled with Healon 5 on the cornea was used to visualize the trabecular meshwork. The istent was opened and introduced into the eye.  The meshwork was engaged with the tip of the iStent injector and the stent was deployed into Schlemm's canal at 10:30.  The second stent was deployed at 8:00.  The stents were well seated and in good position.  Next, attention was turned to the phacoemulsification A curvilinear capsulorrhexis was made with a cystotome and capsulorrhexis forceps.  Balanced salt solution was used to hydrodissect and hydrodelineate the nucleus.   Phacoemulsification was then used in stop and chop fashion to remove the lens nucleus and epinucleus.  The remaining cortex was then removed using the irrigation and aspiration handpiece. Healon was then placed into the capsular bag to distend it for lens placement.  A lens was then injected into the capsular bag.  The remaining viscoelastic was aspirated.   Wounds were hydrated with balanced salt solution.  The anterior chamber was inflated to a physiologic pressure with balanced salt solution.   Intracameral vigamox 0.1 mL undiluted was injected into the eye and a drop placed onto the ocular surface.  No wound leaks were noted. The patient was taken to the recovery room in stable condition without complications of anesthesia or surgery   Benay Pillow 05/12/2019, 9:33 AM

## 2019-05-12 NOTE — Transfer of Care (Signed)
Immediate Anesthesia Transfer of Care Note  Patient: Mario [name]  Procedure(s) Performed: CATARACT EXTRACTION PHACO AND INTRAOCULAR LENS PLACEMENT (IOC) LEFTDIABETIC ISTENT INJ 2.43  00:28.6 (Left Eye)  Patient Location: PACU  Anesthesia Type: MAC  Level of Consciousness: awake, alert  and patient cooperative  Airway and Oxygen Therapy: Patient Spontanous Breathing and Patient connected to supplemental oxygen  Post-op Assessment: Post-op Vital signs reviewed, Patient's Cardiovascular Status Stable, Respiratory Function Stable, Patent Airway and No signs of Nausea or vomiting  Post-op Vital Signs: Reviewed and stable  Complications: No apparent anesthesia complications

## 2019-05-12 NOTE — H&P (Signed)
The History and Physical notes are on paper, have been signed, and are to be scanned.   I have examined the patient and there are no changes to the H&P.   Attestation: 1. The patient's impairment of visual function is believed not to be correctable with a tolerable change in glasses or contact lenses. 2. 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 3. The patient desires surgical correction; the risks, benefits and alternatives have been explained; and a reasonable expectation exists that lens surgery will significantly improve both the visual and functional status of the patient. 4. The patient has primary open angle glaucoma, moderate severity in the left eye.  I certify the statements are true to the best of my knowledge.  Willey Blade 05/12/2019 8:51 AM

## 2019-05-12 NOTE — Anesthesia Procedure Notes (Signed)
Procedure Name: MAC Performed by: Natalia Wittmeyer, CRNA Pre-anesthesia Checklist: Patient identified, Emergency Drugs available, Suction available, Timeout performed and Patient being monitored Patient Re-evaluated:Patient Re-evaluated prior to induction Oxygen Delivery Method: Nasal cannula Placement Confirmation: positive ETCO2       

## 2019-05-12 NOTE — Anesthesia Preprocedure Evaluation (Addendum)
Anesthesia Evaluation  Patient identified by MRN, date of birth, ID band Patient awake    Reviewed: Allergy & Precautions, H&P , NPO status , Patient's Chart, lab work & pertinent test results, reviewed documented beta blocker date and time   Airway Mallampati: II  TM Distance: >3 FB Neck ROM: full    Dental  (+) Upper Dentures, Lower Dentures   Pulmonary sleep apnea, Continuous Positive Airway Pressure Ventilation and Oxygen sleep apnea , COPD, former smoker,    Pulmonary exam normal breath sounds clear to auscultation       Cardiovascular Exercise Tolerance: Good hypertension, +CHF  Atrial Fibrillation  Rhythm:regular Rate:Normal     Neuro/Psych negative neurological ROS  negative psych ROS   GI/Hepatic negative GI ROS, Neg liver ROS,   Endo/Other  diabetes (neuropathy), Type 2, Oral Hypoglycemic Agents  Renal/GU negative Renal ROS  negative genitourinary   Musculoskeletal   Abdominal   Peds  Hematology negative hematology ROS (+)   Anesthesia Other Findings   Reproductive/Obstetrics negative OB ROS                            Anesthesia Physical Anesthesia Plan  ASA: III  Anesthesia Plan: MAC   Post-op Pain Management:    Induction:   PONV Risk Score and Plan: 1 and Treatment may vary due to age or medical condition  Airway Management Planned:   Additional Equipment:   Intra-op Plan:   Post-operative Plan:   Informed Consent: I have reviewed the patients History and Physical, chart, labs and discussed the procedure including the risks, benefits and alternatives for the proposed anesthesia with the patient or authorized representative who has indicated his/her understanding and acceptance.     Dental Advisory Given  Plan Discussed with: CRNA  Anesthesia Plan Comments:         Anesthesia Quick Evaluation

## 2019-05-13 ENCOUNTER — Encounter: Payer: Self-pay | Admitting: *Deleted

## 2019-05-22 ENCOUNTER — Encounter: Payer: Self-pay | Admitting: Ophthalmology

## 2019-05-22 ENCOUNTER — Other Ambulatory Visit: Payer: Self-pay

## 2019-05-29 ENCOUNTER — Other Ambulatory Visit
Admission: RE | Admit: 2019-05-29 | Discharge: 2019-05-29 | Disposition: A | Discharge status: Home / Self Care | Payer: Medicare Other | Source: Ambulatory Visit | Attending: Ophthalmology | Admitting: Ophthalmology

## 2019-05-29 ENCOUNTER — Other Ambulatory Visit: Payer: Self-pay

## 2019-05-29 DIAGNOSIS — Z20822 Contact with and (suspected) exposure to covid-19: Secondary | ICD-10-CM | POA: Diagnosis not present

## 2019-05-29 DIAGNOSIS — Z01812 Encounter for preprocedural laboratory examination: Secondary | ICD-10-CM | POA: Insufficient documentation

## 2019-05-29 LAB — SARS CORONAVIRUS 2 (TAT 6-24 HRS): SARS Coronavirus 2: NEGATIVE

## 2019-05-29 NOTE — Discharge Instructions (Signed)

## 2019-06-02 ENCOUNTER — Ambulatory Visit: Payer: Medicare Other | Admitting: Anesthesiology

## 2019-06-02 ENCOUNTER — Encounter: Payer: Self-pay | Admitting: Ophthalmology

## 2019-06-02 ENCOUNTER — Other Ambulatory Visit: Payer: Self-pay

## 2019-06-02 ENCOUNTER — Ambulatory Visit
Admission: RE | Admit: 2019-06-02 | Discharge: 2019-06-02 | Disposition: A | Discharge status: Home / Self Care | Payer: Medicare Other | Attending: Ophthalmology | Admitting: Ophthalmology

## 2019-06-02 ENCOUNTER — Encounter
Admission: RE | Disposition: A | Discharge status: Home / Self Care | Payer: Self-pay | Source: Home / Self Care | Attending: Ophthalmology

## 2019-06-02 DIAGNOSIS — Z9842 Cataract extraction status, left eye: Secondary | ICD-10-CM | POA: Insufficient documentation

## 2019-06-02 DIAGNOSIS — E1136 Type 2 diabetes mellitus with diabetic cataract: Secondary | ICD-10-CM | POA: Diagnosis not present

## 2019-06-02 DIAGNOSIS — J449 Chronic obstructive pulmonary disease, unspecified: Secondary | ICD-10-CM | POA: Insufficient documentation

## 2019-06-02 DIAGNOSIS — I509 Heart failure, unspecified: Secondary | ICD-10-CM | POA: Diagnosis not present

## 2019-06-02 DIAGNOSIS — Z9981 Dependence on supplemental oxygen: Secondary | ICD-10-CM | POA: Diagnosis not present

## 2019-06-02 DIAGNOSIS — H401112 Primary open-angle glaucoma, right eye, moderate stage: Secondary | ICD-10-CM | POA: Diagnosis not present

## 2019-06-02 DIAGNOSIS — E1140 Type 2 diabetes mellitus with diabetic neuropathy, unspecified: Secondary | ICD-10-CM | POA: Insufficient documentation

## 2019-06-02 DIAGNOSIS — M1990 Unspecified osteoarthritis, unspecified site: Secondary | ICD-10-CM | POA: Diagnosis not present

## 2019-06-02 DIAGNOSIS — Z888 Allergy status to other drugs, medicaments and biological substances status: Secondary | ICD-10-CM | POA: Insufficient documentation

## 2019-06-02 DIAGNOSIS — G4730 Sleep apnea, unspecified: Secondary | ICD-10-CM | POA: Insufficient documentation

## 2019-06-02 DIAGNOSIS — I110 Hypertensive heart disease with heart failure: Secondary | ICD-10-CM | POA: Insufficient documentation

## 2019-06-02 DIAGNOSIS — Z87891 Personal history of nicotine dependence: Secondary | ICD-10-CM | POA: Insufficient documentation

## 2019-06-02 DIAGNOSIS — H2511 Age-related nuclear cataract, right eye: Secondary | ICD-10-CM | POA: Insufficient documentation

## 2019-06-02 DIAGNOSIS — Z7984 Long term (current) use of oral hypoglycemic drugs: Secondary | ICD-10-CM | POA: Diagnosis not present

## 2019-06-02 DIAGNOSIS — Z86711 Personal history of pulmonary embolism: Secondary | ICD-10-CM | POA: Diagnosis not present

## 2019-06-02 HISTORY — DX: Myoneural disorder, unspecified: G70.9

## 2019-06-02 HISTORY — PX: CATARACT EXTRACTION W/PHACO: SHX586

## 2019-06-02 HISTORY — DX: Zoster without complications: B02.9

## 2019-06-02 LAB — GLUCOSE, CAPILLARY
Glucose-Capillary: 116 mg/dL — ABNORMAL HIGH (ref 70–99)
Glucose-Capillary: 134 mg/dL — ABNORMAL HIGH (ref 70–99)

## 2019-06-02 SURGERY — PHACOEMULSIFICATION, CATARACT, WITH IOL INSERTION
Anesthesia: Monitor Anesthesia Care | Site: Eye | Laterality: Right

## 2019-06-02 MED ORDER — SODIUM HYALURONATE 23 MG/ML IO SOLN
INTRAOCULAR | Status: DC | PRN
  Administered 2019-06-02: 0.6 mL via INTRAOCULAR

## 2019-06-02 MED ORDER — ACETAMINOPHEN 325 MG PO TABS: 325.0000 mg | ORAL_TABLET | Freq: Once | ORAL | Status: DC

## 2019-06-02 MED ORDER — ACETAMINOPHEN 160 MG/5ML PO SOLN: 325.0000 mg | Freq: Once | ORAL | Status: DC

## 2019-06-02 MED ORDER — ARMC OPHTHALMIC DILATING DROPS
1.0000 "application " | OPHTHALMIC | Status: DC | PRN
  Administered 2019-06-02 (×3): 1 via OPHTHALMIC

## 2019-06-02 MED ORDER — FENTANYL CITRATE (PF) 100 MCG/2ML IJ SOLN
INTRAMUSCULAR | Status: DC | PRN
  Administered 2019-06-02: 50 ug via INTRAVENOUS

## 2019-06-02 MED ORDER — MIDAZOLAM HCL 2 MG/2ML IJ SOLN
INTRAMUSCULAR | Status: DC | PRN
  Administered 2019-06-02: 1 mg via INTRAVENOUS

## 2019-06-02 MED ORDER — SODIUM HYALURONATE 10 MG/ML IO SOLN
INTRAOCULAR | Status: DC | PRN
  Administered 2019-06-02: 0.55 mL via INTRAOCULAR

## 2019-06-02 MED ORDER — EPINEPHRINE PF 1 MG/ML IJ SOLN
INTRAOCULAR | Status: DC | PRN
  Administered 2019-06-02: 78 mL via OPHTHALMIC

## 2019-06-02 MED ORDER — LACTATED RINGERS IV SOLN: 10.0000 mL/h | INTRAVENOUS | Status: DC

## 2019-06-02 MED ORDER — LIDOCAINE HCL (PF) 2 % IJ SOLN
INTRAOCULAR | Status: DC | PRN
  Administered 2019-06-02: 1 mL via INTRAOCULAR

## 2019-06-02 MED ORDER — MOXIFLOXACIN HCL 0.5 % OP SOLN
OPHTHALMIC | Status: DC | PRN
  Administered 2019-06-02: 0.2 mL via OPHTHALMIC

## 2019-06-02 MED ORDER — TETRACAINE HCL 0.5 % OP SOLN
1.0000 [drp] | OPHTHALMIC | Status: DC | PRN
  Administered 2019-06-02 (×3): 1 [drp] via OPHTHALMIC

## 2019-06-02 SURGICAL SUPPLY — 21 items
CANNULA ANT/CHMB 27G (MISCELLANEOUS) ×2 IMPLANT
CANNULA ANT/CHMB 27GA (MISCELLANEOUS) ×6 IMPLANT
DEVICE INJECT ISTENT W (Stent) IMPLANT
DISSECTOR HYDRO NUCLEUS 50X22 (MISCELLANEOUS) ×3 IMPLANT
GLOVE SURG LX 7.5 STRW (GLOVE) ×2
GLOVE SURG LX STRL 7.5 STRW (GLOVE) ×1 IMPLANT
GLOVE SURG SYN 8.5  E (GLOVE) ×2
GLOVE SURG SYN 8.5 E (GLOVE) ×1 IMPLANT
GLOVE SURG SYN 8.5 PF PI (GLOVE) ×1 IMPLANT
GOWN STRL REUS W/ TWL LRG LVL3 (GOWN DISPOSABLE) ×2 IMPLANT
GOWN STRL REUS W/TWL LRG LVL3 (GOWN DISPOSABLE) ×4
INJECT ISTENT W (Stent) ×3 IMPLANT
LENS IOL TECNIS ITEC 12.0 (Intraocular Lens) ×2 IMPLANT
MARKER SKIN DUAL TIP RULER LAB (MISCELLANEOUS) ×3 IMPLANT
PACK DR. KING ARMS (PACKS) ×3 IMPLANT
PACK EYE AFTER SURG (MISCELLANEOUS) ×3 IMPLANT
PACK OPTHALMIC (MISCELLANEOUS) ×3 IMPLANT
SYR 3ML LL SCALE MARK (SYRINGE) ×3 IMPLANT
SYR TB 1ML LUER SLIP (SYRINGE) ×3 IMPLANT
WATER STERILE IRR 250ML POUR (IV SOLUTION) ×3 IMPLANT
WIPE NON LINTING 3.25X3.25 (MISCELLANEOUS) ×3 IMPLANT

## 2019-06-02 NOTE — Transfer of Care (Signed)
Immediate Anesthesia Transfer of Care Note  Patient: Mario [name]  Procedure(s) Performed: CATARACT EXTRACTION PHACO AND INTRAOCULAR LENS PLACEMENT (IOC) ISTENT INJ  RIGHT DIABETIC 1.31  00:25.4 (Right Eye)  Patient Location: PACU  Anesthesia Type: MAC  Level of Consciousness: awake, alert  and patient cooperative  Airway and Oxygen Therapy: Patient Spontanous Breathing and Patient connected to supplemental oxygen  Post-op Assessment: Post-op Vital signs reviewed, Patient's Cardiovascular Status Stable, Respiratory Function Stable, Patent Airway and No signs of Nausea or vomiting  Post-op Vital Signs: Reviewed and stable  Complications: No apparent anesthesia complications

## 2019-06-02 NOTE — Op Note (Signed)
OPERATIVE NOTE  Talor Cheema [id number] 06/02/2019  PREOPERATIVE DIAGNOSIS:   1.  Moderate PRIMARY open angle glaucoma, right eye.  K99.8338 2.  Nuclear sclerotic cataract right eye.  H25.11   POSTOPERATIVE DIAGNOSIS:    same.   PROCEDURE:   1.  Placement of trabecular bypass stent (istent). CPT 0191T  and placement of additional stent  CPT 0376T 2.  Phacoemusification with posterior chamber intraocular lens placement of the right eye  CPT [phone]   LENS: Implant Name Type Inv. Item Serial No. Manufacturer Lot No. LRB No. Used Action  Marko Stai - [id number] Stent Marko Stai [id number] [employer]  Right 1 Implanted  LENS IOL DIOP 12.0 - [id number] Intraocular Lens LENS IOL DIOP 12.0 [id number] [employer]  Right 1 Implanted      Procedure(s): CATARACT EXTRACTION PHACO AND INTRAOCULAR LENS PLACEMENT (IOC) ISTENT INJ  RIGHT DIABETIC 1.31  00:25.4 (Right)  PCB00 +12.0   ULTRASOUND TIME: 0 minutes 25 seconds.  CDE 1.31   SURGEON:  Benay Pillow, MD, MPH  ANESTHESIOLOGIST: Anesthesiologist: Ronelle Nigh, MD CRNA: Cameron Ali, CRNA   ANESTHESIA:  MAC and intracameral preservative-free lidocaine 4%.  ESTIMATED BLOOD LOSS: less than 1 mL.   COMPLICATIONS:  None.   DESCRIPTION OF PROCEDURE:  The patient was identified in the holding room and transported to the operating room.   The patient was placed in the supine position under the operating microscope.  The right eye was prepped and draped in the usual sterile ophthalmic fashion.   A 1.0 millimeter clear-corneal paracentesis was made at the 10:30 position. 0.5 ml of preservative-free 1% lidocaine with epinephrine was injected into the anterior chamber.  The anterior chamber was filled with Healon 5 viscoelastic.  A 2.4 millimeter keratome was used to make a near-clear corneal incision at the 8:00 position.   Attention was turned to the istent.  The patients head was turned to the left and the microscope was  tilted to 035 degrees.  Ocular instruments/[employer] OAL/H2 gonioprism was used with IPC05 (iclip) coupled with Healon 5 on the cornea was used to visualize the trabecular meshwork. The istent was opened and introduced into the eye.  The meshwork was engaged with the tip of the iStent injector and the stent was deployed into Schlemm's canal at 2:00.  The second stent was deployed at 4:00.  The stents were well seated and in good position.  Next, attention was turned to the phacoemulsification A curvilinear capsulorrhexis was made with a cystotome and capsulorrhexis forceps.  Balanced salt solution was used to hydrodissect and hydrodelineate the nucleus.   Phacoemulsification was then used in stop and chop fashion to remove the lens nucleus and epinucleus.  The remaining cortex was then removed using the irrigation and aspiration handpiece. Healon was then placed into the capsular bag to distend it for lens placement.  A lens was then injected into the capsular bag.  The remaining viscoelastic was aspirated.  There was trace blood reflex into the anterior chamber, evidence of good istent placement.   Wounds were hydrated with balanced salt solution.  The anterior chamber was inflated to a physiologic pressure with balanced salt solution.   Intracameral vigamox 0.1 mL undiluted was injected into the eye and a drop placed onto the ocular surface.  No wound leaks were noted. The patient was taken to the recovery room in stable condition without complications of anesthesia or surgery   Benay Pillow 06/02/2019, 8:40 AM

## 2019-06-02 NOTE — Anesthesia Postprocedure Evaluation (Signed)
Anesthesia Post Note  Patient: Mario [name]  Procedure(s) Performed: CATARACT EXTRACTION PHACO AND INTRAOCULAR LENS PLACEMENT (IOC) ISTENT INJ  RIGHT DIABETIC 1.31  00:25.4 (Right Eye)     Patient location during evaluation: PACU Anesthesia Type: MAC Level of consciousness: awake and alert and oriented Pain management: satisfactory to patient Vital Signs Assessment: post-procedure vital signs reviewed and stable Respiratory status: spontaneous breathing, nonlabored ventilation and respiratory function stable Cardiovascular status: blood pressure returned to baseline and stable Postop Assessment: Adequate PO intake and No signs of nausea or vomiting Anesthetic complications: no    Raliegh Ip

## 2019-06-02 NOTE — Anesthesia Preprocedure Evaluation (Signed)
Anesthesia Evaluation  Patient identified by MRN, date of birth, ID band Patient awake    Reviewed: Allergy & Precautions, H&P , NPO status , Patient's Chart, lab work & pertinent test results, reviewed documented beta blocker date and time   Airway Mallampati: II  TM Distance: >3 FB Neck ROM: full    Dental  (+) Upper Dentures, Lower Dentures   Pulmonary sleep apnea, Continuous Positive Airway Pressure Ventilation and Oxygen sleep apnea , COPD, former smoker,    Pulmonary exam normal breath sounds clear to auscultation       Cardiovascular Exercise Tolerance: Good hypertension, +CHF  Normal cardiovascular exam Rhythm:regular Rate:Normal     Neuro/Psych negative neurological ROS  negative psych ROS   GI/Hepatic negative GI ROS, Neg liver ROS,   Endo/Other  diabetes, Type 2, Oral Hypoglycemic Agents  Renal/GU negative Renal ROS  negative genitourinary   Musculoskeletal   Abdominal   Peds  Hematology negative hematology ROS (+)   Anesthesia Other Findings   Reproductive/Obstetrics negative OB ROS                             Anesthesia Physical  Anesthesia Plan  ASA: IV  Anesthesia Plan: MAC   Post-op Pain Management:    Induction:   PONV Risk Score and Plan: 1 and Treatment may vary due to age or medical condition and TIVA  Airway Management Planned:   Additional Equipment:   Intra-op Plan:   Post-operative Plan:   Informed Consent: I have reviewed the patients History and Physical, chart, labs and discussed the procedure including the risks, benefits and alternatives for the proposed anesthesia with the patient or authorized representative who has indicated his/her understanding and acceptance.     Dental Advisory Given  Plan Discussed with: CRNA  Anesthesia Plan Comments:         Anesthesia Quick Evaluation

## 2019-06-02 NOTE — Anesthesia Procedure Notes (Signed)
Procedure Name: [place] Performed by: Cameron Ali, CRNA Pre-anesthesia Checklist: Patient identified, Emergency Drugs available, Suction available, Timeout performed and Patient being monitored Patient Re-evaluated:Patient Re-evaluated prior to induction Oxygen Delivery Method: Nasal cannula Placement Confirmation: positive ETCO2

## 2019-06-02 NOTE — H&P (Signed)
The History and Physical notes are on paper, have been signed, and are to be scanned.   I have examined the patient and there are no changes to the H&P.   Attestation: 1. The patient's impairment of visual function is believed not to be correctable with a tolerable change in glasses or contact lenses. 2. 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 3. The patient desires surgical correction; the risks, benefits and alternatives have been explained; and a reasonable expectation exists that lens surgery will significantly improve both the visual and functional status of the patient. 4. The patient has moderate primary open angle glaucoma, right eye and an istent inject is indicated.  I certify the statements are true to the best of my knowledge.  Willey Blade 06/02/2019 7:54 AM

## 2019-06-03 ENCOUNTER — Encounter: Payer: Self-pay | Admitting: *Deleted

## 2019-06-10 NOTE — Progress Notes (Signed)
Patient: Mario [name]  Service Category: E/M  Provider: Gillis Santa, MD  DOB: 02-04-1946  DOS: 06/11/2019  Referring Provider: Joesphine Bare  MRN: [id number]  Setting: Ambulatory outpatient  PCP: Sallee Lange, NP  Type: New Patient  Specialty: Interventional Pain Management    Location: Office  Delivery: Face-to-face     Primary [name](s) for Visit: Encounter for initial evaluation of one or more chronic problems (new to examiner) potentially causing chronic pain, and posing a threat to normal musculoskeletal function. (Level of risk: High) CC: Neck Pain, Back Pain (lower), and Knee Pain (bilateral)  HPI  Mario [name] is a 74 y.o. year old, male patient, who comes today to see [place] for the first time for an initial evaluation of his chronic pain. He has Acute systolic CHF (congestive heart failure) ([place]); Arthralgia of left lower leg; Cardiomyopathy ([place]); Cataract, nuclear sclerotic senile; Chronic obstructive pulmonary disease ([place]); Chronic systolic heart failure ([place]); Combined forms of age-related cataract; COPD (chronic obstructive pulmonary disease) ([place]); Emphysema of lung ([place]); Essential hypertension; Exercise hypoxemia; Flat feet; Foot pain, bilateral; HTN (hypertension); ED (erectile dysfunction) of organic origin; Keratitis sicca, bilateral ([place]); Long term current use of anticoagulant therapy; Lung nodule; Muscle weakness (generalized); Nuclear sclerosis; Obesity; Onychomycosis; OSA (obstructive sleep apnea); Osteoarthritis of left knee; PAF (paroxysmal atrial fibrillation) ([place]); Primary open angle glaucoma; Primary open angle glaucoma of both eyes, severe stage; Ptosis of both eyelids; Pulmonary embolism ([place]); Senile nuclear sclerosis; Screen for colon cancer; Systolic HF (heart failure) ([place]); Tinea pedis of both feet; Type 2 diabetes mellitus with diabetic neuropathy ([place]); Type 2 diabetes mellitus without complication, without long-term current use of insulin ([place]); Type 2  diabetes mellitus without retinopathy ([place]); Bilateral primary osteoarthritis of knee; Cervical radicular pain; Cervical facet joint syndrome; Lumbar facet arthropathy; Lumbar radiculopathy; and Chronic pain syndrome on their problem list. Today he comes in for evaluation of his Neck Pain, Back Pain (lower), and Knee Pain (bilateral)  Pain Assessment: Location: Right, Left Neck Radiating: shoulders bilateral andy hands Onset: More than a month ago Duration: Chronic pain Quality: Aching, Discomfort Severity: 6 /10 (subjective, self-reported pain score)  Note: Reported level is compatible with observation.                         When using our objective Pain Scale, levels between 6 and 10/10 are said to belong in an emergency room, as it progressively worsens from a 6/10, described as severely limiting, requiring emergency care not usually available at an outpatient pain management facility. At a 6/10 level, communication becomes difficult and requires great effort. Assistance to reach the emergency department may be required. Facial flushing and profuse sweating along with potentially dangerous increases in heart rate and blood pressure will be evident. Effect on ADL: hard to open items, move fingers Timing: Constant Modifying factors: heat BP: 118/84  HR: 94  Onset and Duration: Sudden and Present longer than 3 months Cause of pain: Unknown Severity: No change since onset, NAS-11 at its worse: 10/10, NAS-11 at its best: 4/10, NAS-11 now: 6/10 and NAS-11 on the average: 7/10 Timing: Not influenced by the time of the day, During activity or exercise and After activity or exercise Aggravating Factors: Motion and Walking Alleviating Factors: Medications Associated Problems: Numbness Quality of Pain: Throbbing Previous Examinations or Tests: MRI scan Previous Treatments: Narcotic medications  The patient comes into the clinics today for the first time for a chronic pain management evaluation.  Mario [name] is a pleasant 74 year old male who presents with a chief complaint of bilateral knee pain as well as neck pain that radiates to bilateral shoulders and occasionally to the bilateral arms.  This is been present for many years.  This is a chronic condition.  Is not worse over time.  Regards to his bilateral knee pain related to bilateral knee osteoarthritis, the patient would like to avoid knee surgery if at all possible.  He has tried intra-articular knee injections in the past with steroid which have been somewhat helpful.  Patient is also a type II diabetic on Metformin with most recent A1c of 6.0.  He also endorses bilateral hand pain and cramps.  He also has an impressive cardiac history with atrial fibrillation, history of PE.  He is currently anticoagulated with edoxaban.  In regards to medications, he is on Cymbalta 60 mg daily, gabapentin 800 mg 3 times a day along with Celebrex 200 mg daily.  Patient utilizes tramadol 50 mg daily which he states does help take the "edge off".  He does ambulate with a cane.  Historic Controlled Substance Pharmacotherapy Review  PMP and historical list of controlled substances:  05/31/2019  1   04/17/2019  Tramadol Hcl 50 MG Tablet  28.00  7 [place]   [id number]   [facility] ([id number])   1  20.00 MME  Medicare   [place]   Pharmacodynamics: Desired effects: Analgesia: The patient reports >50% benefit. Reported improvement in function: The patient reports medication allows him to accomplish basic ADLs. Clinically meaningful improvement in function (CMIF): Sustained CMIF goals met Perceived effectiveness: Described as relatively effective, allowing for increase in activities of daily living (ADL) Undesirable effects: Side-effects or Adverse reactions: None reported Historical Monitoring: The patient  reports no history of drug use. List of all UDS Test(s): No results found for: MDMA, COCAINSCRNUR, [place], [place], CANNABQUANT, THCU, [place] List of other Serum/Urine  Drug Screening Test(s):  No results found for: AMPHSCRSER, BARBSCRSER, BENZOSCRSER, COCAINSCRSER, COCAINSCRNUR, PCPSCRSER, PCPQUANT, THCSCRSER, THCU, CANNABQUANT, OPIATESCRSER, OXYSCRSER, PROPOXSCRSER, ETH Historical Background Evaluation: [place] PMP: PDMP reviewed during this encounter. Six (6) year initial data search conducted.             [place] Department of public safety, offender search: Editor, commissioning Information) Non-contributory Risk Assessment Profile: Aberrant behavior: None observed or detected today Risk factors for fatal opioid overdose: None identified today Fatal overdose hazard ratio (HR): Calculation deferred Non-fatal overdose hazard ratio (HR): Calculation deferred Risk of opioid abuse or dependence: 0.7-3.0% with doses ? 36 MME/day and 6.1-26% with doses ? 120 MME/day. Substance use disorder (SUD) risk level: See below Personal History of Substance Abuse (SUD-Substance use disorder):  Alcohol: Negative  Illegal Drugs: Negative  Rx Drugs: Negative  ORT Risk Level calculation: Low Risk Opioid Risk Tool - 06/11/19 1135      Family History of Substance Abuse   Alcohol  Negative    Illegal Drugs  Negative    Rx Drugs  Negative      Personal History of Substance Abuse   Alcohol  Negative    Illegal Drugs  Negative    Rx Drugs  Negative      Psychological Disease   Psychological Disease  Negative    Depression  Negative      Total Score   Opioid Risk Tool Scoring  0    Opioid Risk Interpretation  Low Risk      ORT Scoring interpretation table:  Score <3 = Low Risk for SUD  Score between 4-7 = Moderate Risk for SUD  Score >8 = High Risk for Opioid Abuse   PHQ-2 Depression Scale:  Total score:    PHQ-2 Scoring interpretation table: (Score and probability of major depressive disorder)  Score 0 = No depression  Score 1 = 15.4% Probability  Score 2 = 21.1% Probability  Score 3 = 38.4% Probability  Score 4 = 45.5% Probability  Score 5 = 56.4% Probability  Score 6 = 78.6%  Probability   PHQ-9 Depression Scale:  Total score:    PHQ-9 Scoring interpretation table:  Score 0-4 = No depression  Score 5-9 = Mild depression  Score 10-14 = Moderate depression  Score 15-19 = Moderately severe depression  Score 20-27 = Severe depression (2.4 times higher risk of SUD and 2.89 times higher risk of overuse)   Pharmacologic Plan: As per protocol, I have not taken over any controlled substance management, pending the results of ordered tests and/or consults.            Initial impression: Pending review of available data and ordered tests.  Meds   Current Outpatient Medications:  .  albuterol (PROVENTIL HFA;VENTOLIN HFA) 108 (90 Base) MCG/ACT inhaler, Inhale 2 puffs into the lungs as needed., Disp: , Rfl:  .  APPLE CIDER VINEGAR PO, Take by mouth daily., Disp: , Rfl:  .  celecoxib (CELEBREX) 200 MG capsule, , Disp: , Rfl:  .  DULoxetine (CYMBALTA) 60 MG capsule, am, Disp: , Rfl:  .  edoxaban (SAVAYSA) 60 MG TABS tablet, Take 60 mg by mouth daily. cardiology, Disp: , Rfl:  .  furosemide (LASIX) 80 MG tablet, Take 1 tablet by mouth 2 (two) times daily. 80 mg AM, 120 mg PM, Disp: , Rfl:  .  glucose blood (ONE TOUCH ULTRA TEST) test strip, Use as instructed FOR TESTING three times daily.  E11.9, Disp: , Rfl:  .  ipratropium-albuterol (DUONEB) 0.5-2.5 (3) MG/3ML SOLN, Inhale into the lungs., Disp: , Rfl:  .  metFORMIN (GLUCOPHAGE) 1000 MG tablet, Take 1 tablet by mouth 2 (two) times daily., Disp: , Rfl:  .  OZEMPIC, 1 MG/DOSE, 2 MG/1.5ML SOPN, , Disp: , Rfl:  .  sotalol (BETAPACE) 80 MG tablet, Take 1 tablet by mouth 2 (two) times daily., Disp: , Rfl:  .  traMADol (ULTRAM) 50 MG tablet, Take by mouth 3 (three) times daily., Disp: , Rfl:  .  TURMERIC PO, Take by mouth daily., Disp: , Rfl:  .  Fluticasone-Umeclidin-Vilant (TRELEGY ELLIPTA IN), Inhale into the lungs daily., Disp: , Rfl:  .  gabapentin (NEURONTIN) 300 MG capsule, Take 800 mg by mouth 3 (three) times daily. ,  Disp: , Rfl:  .  SOTALOL AF 80 MG TABS, Take by mouth 2 (two) times daily. Breakfast and dinner, Disp: , Rfl:   Imaging Review  Cervical Imaging: Cervical MR wo contrast:  Results for orders placed during the hospital encounter of 02/15/18  MR CERVICAL SPINE WO CONTRAST   Narrative CLINICAL DATA:  Bilateral hand numbness and tingling 3 months, no known injury  EXAM: MRI CERVICAL SPINE WITHOUT CONTRAST  TECHNIQUE: Multiplanar, multisequence MR imaging of the cervical spine was performed. No intravenous contrast was administered.  COMPARISON:  None.  FINDINGS: Alignment: Physiologic.  Vertebrae: No fracture, evidence of discitis, or bone lesion.  Cord: Normal signal and morphology.  Posterior Fossa, vertebral arteries, paraspinal tissues: Posterior fossa demonstrates no focal abnormality. Vertebral artery flow voids are maintained. Paraspinal soft tissues are unremarkable.  Disc levels:  Discs: Mild degenerative disc disease with disc height loss at C5-6.  C2-3: No significant disc bulge. No neural foraminal stenosis. No central canal stenosis.  C3-4: No significant disc bulge. No neural foraminal stenosis. No central canal stenosis.  C4-5: Minimal broad-based disc bulge. Moderate left and mild right facet arthropathy. Mild right foraminal stenosis. Moderate left foraminal stenosis. No central canal stenosis.  C5-6: Broad-based disc bulge with a small right paracentral/foraminal disc protrusion. Bilateral uncovertebral degenerative changes. Severe right foraminal stenosis. Moderate left foraminal stenosis. No central canal stenosis.  C6-7: Broad-based disc bulge. Mild bilateral foraminal stenosis. No central canal stenosis.  C7-T1: No significant disc bulge. No neural foraminal stenosis. No central canal stenosis.  T1-2: Mild broad-based disc bulge. Moderate bilateral facet arthropathy. Moderate right foraminal stenosis. No significant left foraminal  stenosis.  IMPRESSION: 1. At C4-5 there is a minimal broad-based disc bulge. Moderate left and mild right facet arthropathy. Mild right foraminal stenosis. Moderate left foraminal stenosis. 2. At C5-6 there is a broad-based disc bulge with a small right paracentral/foraminal disc protrusion. Bilateral uncovertebral degenerative changes. Severe right foraminal stenosis. Moderate left foraminal stenosis. 3. At C6-7 there is a broad-based disc bulge. Mild bilateral foraminal stenosis. 4. At T1-2 there is a mild broad-based disc bulge. Moderate bilateral facet arthropathy. Moderate right foraminal stenosis. No significant left foraminal stenosis.   Electronically Signed   By: Kathreen Devoid   On: 02/15/2018 09:51    Lumbosacral Imaging: Lumbar MR wo contrast:  Results for orders placed during the hospital encounter of 04/03/18  MR LUMBAR SPINE WO CONTRAST   Narrative CLINICAL DATA:  Bilateral leg weakness over the last year.  EXAM: MRI LUMBAR SPINE WITHOUT CONTRAST  TECHNIQUE: Multiplanar, multisequence MR imaging of the lumbar spine was performed. No intravenous contrast was administered.  COMPARISON:  None.  FINDINGS: Segmentation:  5 lumbar type vertebral bodies.  Alignment:  Normal  Vertebrae:  Normal  Conus medullaris and cauda equina: Conus extends to the L1-2 level. Conus and cauda equina appear normal.  Paraspinal and other soft tissues: Normal  Disc levels:  Minimal non-compressive disc bulges at L2-3 and above.  L3-4: Desiccation of the disc with circumferential bulging. Mild lateral recess and foraminal narrowing but without visible neural compression.  L4-5: Circumferential bulging of the disc. Bilateral facet osteoarthritis. 3 mm synovial cyst projecting inward from the facet on the right. Mild stenosis of both lateral recesses. Moderate bilateral foraminal narrowing. Potential for neural compression at this level, particularly within the neural  foramina.  L5-S1: Chronic disc degeneration with loss of height. Endplate osteophytes and bulging of the disc. No central canal stenosis. Mild bilateral facet hypertrophy. Bilateral foraminal narrowing because of encroachment by osteophyte and disc material could affect either L5 nerve.  IMPRESSION: L3-4: Disc bulge. Mild lateral recess and foraminal narrowing but without distinct neural compression.  L4-5: Disc bulge. Facet degeneration and hypertrophy. Small synovial cyst projecting inward from the right facet. Narrowing of the lateral recesses and foramina that could cause neural compression on either or both sides.  L5-S1: Disc degeneration with endplate osteophytes and bulging of the disc. Facet degeneration. Bilateral foraminal stenosis that could affect either or both exiting L5 nerves.   Electronically Signed   By: Nelson Chimes M.D.   On: 04/03/2018 12:40      Ankle-L DG Complete:  Results for orders placed during the hospital encounter of 09/29/16  DG Ankle Complete Left   Narrative CLINICAL DATA:  Left ankle pain for 5 days ago.  Medial ankle pain. Initial encounter. Pain is worse with weight-bearing.  EXAM: LEFT ANKLE COMPLETE - 3+ VIEW  COMPARISON:  None.  FINDINGS: Mild soft tissue prominence is present at both the medial and lateral malleoli. There is no underlying fracture. The joint is located. Lucency along the medial aspect of the talus may reflect an osteochondral defect without collapse. A plantar calcaneal spurs noted. Diabetic vascular calcifications are noted.  IMPRESSION: 1. Mild soft swelling over the medial and lateral malleoli without acute osseous abnormality. 2. Osteochondral defect in the medial talar dome without collapse. 3. Diabetic vascular calcifications. 4. Plantar calcaneal spur.   Electronically Signed   By: [place] Morelle M.D.   On: 09/29/2016 11:12     Complexity Note: Imaging results reviewed. Results shared  with Mario [name], using Layman's terms.                         ROS  Cardiovascular: Abnormal heart rhythm and Blood thinners:  Anticoagulant Pulmonary or Respiratory: Difficulty blowing air out (Emphysema) Neurological: No reported neurological signs or symptoms such as seizures, abnormal skin sensations, urinary and/or fecal incontinence, being born with an abnormal open spine and/or a tethered spinal cord Psychological-Psychiatric: No reported psychological or psychiatric signs or symptoms such as difficulty sleeping, anxiety, depression, delusions or hallucinations (schizophrenial), mood swings (bipolar disorders) or suicidal ideations or attempts Gastrointestinal: No reported gastrointestinal signs or symptoms such as vomiting or evacuating blood, reflux, heartburn, alternating episodes of diarrhea and constipation, inflamed or scarred liver, or pancreas or irrregular and/or infrequent bowel movements Genitourinary: No reported renal or genitourinary signs or symptoms such as difficulty voiding or producing urine, peeing blood, non-functioning kidney, kidney stones, difficulty emptying the bladder, difficulty controlling the flow of urine, or chronic kidney disease Hematological: No reported hematological signs or symptoms such as prolonged bleeding, low or poor functioning platelets, bruising or bleeding easily, hereditary bleeding problems, low energy levels due to low hemoglobin or being anemic Endocrine: High blood sugar controlled without the use of insulin (NIDDM) Rheumatologic: No reported rheumatological signs and symptoms such as fatigue, joint pain, tenderness, swelling, redness, heat, stiffness, decreased range of motion, with or without associated rash Musculoskeletal: Negative for myasthenia gravis, muscular dystrophy, multiple sclerosis or malignant hyperthermia Work History: Retired  Allergies  Mario [name] is allergic to rosuvastatin; ace inhibitors; lisinopril; and simbrinza  [brinzolamide-brimonidine].  Laboratory Chemistry Profile   Renal Lab Results  Component Value Date   BUN 24 02/15/2016   CREATININE 1.22 02/15/2016   BCR 20 02/15/2016   GFRAA 69 02/15/2016   GFRNONAA 60 02/15/2016    Electrolytes Lab Results  Component Value Date   NA 141 02/15/2016   K 4.2 02/15/2016   CL 97 02/15/2016   CALCIUM 9.5 02/15/2016   PHOS 3.2 02/15/2016    Hepatic Lab Results  Component Value Date   ALBUMIN 4.3 02/15/2016    ID Lab Results  Component Value Date   SARSCOV2NAA NEGATIVE 05/29/2019    Bone No results found for: VD25OH, [id number], [id number], [id number], 25OHVITD1, 25OHVITD2, 25OHVITD3, TESTOFREE, TESTOSTERONE  Endocrine Lab Results  Component Value Date   GLUCOSE 87 02/15/2016   HGBA1C 6.8 (H) 02/15/2016    Neuropathy Lab Results  Component Value Date   HGBA1C 6.8 (H) 02/15/2016    CNS No results found for: COLORCSF, APPEARCSF, RBCCOUNTCSF, WBCCSF, POLYSCSF, LYMPHSCSF, EOSCSF, PROTEINCSF, GLUCCSF, JCVIRUS, CSFOLI, IGGCSF, LABACHR, ACETBL  Inflammation (CRP: Acute  ESR: Chronic) No results found for: CRP, ESRSEDRATE, LATICACIDVEN  Rheumatology No results found for: RF, ANA, LABURIC, URICUR, LYMEIGGIGMAB, LYMEABIGMQN, HLAB27  Coagulation No results found for: INR, LABPROT, APTT, PLT, DDIMER, LABHEMA, VITAMINK1, AT3  Cardiovascular No results found for: BNP, CKTOTAL, CKMB, TROPONINI, HGB, HCT, LABVMA, EPIRU, EPINEPH24HUR, NOREPRU, NOREPI24HUR, DOPARU, [id number]  Screening Lab Results  Component Value Date   [place] 05/29/2019    Cancer No results found for: CEA, CA125, LABCA2  Note: Lab results reviewed.  PFSH  Drug: Mario [name]  reports no history of drug use. Alcohol:  reports no history of alcohol use. Tobacco:  reports that he quit smoking about 50 years ago. His smoking use included cigarettes. He has a 0.50 pack-year smoking history. He has never used smokeless tobacco. Medical:  has a past medical history of  Arthritis, Atrial fibrillation ([place]), CHF (congestive heart failure) ([place]), COPD (chronic obstructive pulmonary disease) ([place]), Diabetes mellitus without complication ([place]), Diabetic neuropathy ([place]), Heart murmur, Hypertension, Neuromuscular disorder ([place]), Neuropathy, Pulmonary embolism ([place]), Shingles ('99), Sleep apnea, and Wears dentures. Family: family history is not on file.  Past Surgical History:  Procedure Laterality Date  . CATARACT EXTRACTION W/PHACO Left 05/12/2019   Procedure: CATARACT EXTRACTION PHACO AND INTRAOCULAR LENS PLACEMENT (IOC) LEFTDIABETIC ISTENT INJ 2.43  00:28.6;  Surgeon: Eulogio Bear, MD;  Location: [facility];  Service: Ophthalmology;  Laterality: Left;  Diabetic - oral meds sleep apnea  . CATARACT EXTRACTION W/PHACO Right 06/02/2019   Procedure: CATARACT EXTRACTION PHACO AND INTRAOCULAR LENS PLACEMENT (IOC) ISTENT INJ  RIGHT DIABETIC 1.31  00:25.4;  Surgeon: Eulogio Bear, MD;  Location: [facility];  Service: Ophthalmology;  Laterality: Right;  . COLONOSCOPY  08/30/2015  . FINGER SURGERY Right    3'rd finger, right hand  . KNEE SURGERY Right    screw in knee  . WRIST SURGERY Left    metal removed   Active Ambulatory Problems    Diagnosis Date Noted  . Acute systolic CHF (congestive heart failure) ([place]) 09/16/2013  . Arthralgia of left lower leg 10/29/2014  . Cardiomyopathy ([place]) 10/08/2013  . Cataract, nuclear sclerotic senile 04/01/2014  . Chronic obstructive pulmonary disease ([place]) 03/16/2015  . Chronic systolic heart failure ([place]) 11/11/2013  . 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 COPD (chronic obstructive pulmonary disease) ([place]) 09/16/2013  . Emphysema of lung ([place]) 11/11/2013  . Essential hypertension 11/11/2013  . Exercise hypoxemia 12/28/2015  . Flat feet 01/29/2014  . Foot pain, bilateral 01/29/2014  . HTN (hypertension) 09/16/2013  . ED (erectile dysfunction) of organic origin 05/13/2003  .  Keratitis sicca, bilateral ([place]) 04/01/2014  . Long term current use of anticoagulant therapy 06/17/2009  . Lung nodule 02/09/2016  . Muscle weakness (generalized) 11/11/2009  . Nuclear sclerosis 03/16/2015  . Obesity 09/16/2013  . Onychomycosis 11/19/2013  . OSA (obstructive sleep apnea) 03/16/2015  . Osteoarthritis of left knee 09/10/2014  . PAF (paroxysmal atrial fibrillation) ([place]) 09/16/2013  . Primary open angle glaucoma 02/27/2014  . Primary open angle glaucoma of both eyes, severe stage 03/16/2015  . Ptosis of both eyelids 11/25/2013  . Pulmonary embolism ([place]) 06/17/2009  . Senile nuclear sclerosis 05/22/2013  . Screen for colon cancer 11/05/2013  . Systolic HF (heart failure) ([place]) 10/08/2013  . Tinea pedis of both feet 01/29/2014  . Type 2 diabetes mellitus with diabetic neuropathy ([place]) 08/04/2014  . Type 2 diabetes mellitus without complication, without long-term current use of insulin ([place]) 03/16/2015  . Type 2 diabetes mellitus without retinopathy ([place]) 11/25/2013  . Bilateral primary osteoarthritis  of knee 06/11/2019  . Cervical radicular pain 06/11/2019  . Cervical facet joint syndrome 06/11/2019  . Lumbar facet arthropathy 06/11/2019  . Lumbar radiculopathy 06/11/2019  . Chronic pain syndrome 06/11/2019   Resolved Ambulatory Problems    Diagnosis Date Noted  . No Resolved Ambulatory Problems   Past Medical History:  Diagnosis Date  . Arthritis   . Atrial fibrillation ([place])   . CHF (congestive heart failure) ([place])   . Diabetes mellitus without complication ([place])   . Diabetic neuropathy ([place])   . Heart murmur   . Hypertension   . Neuromuscular disorder ([place])   . Neuropathy   . Shingles '99  . Sleep apnea   . Wears dentures    Constitutional Exam  General appearance: Well nourished, well developed, and well hydrated. In no apparent acute distress Vitals:   06/11/19 1120  BP: 118/84  Pulse: 94  Resp: 20  Temp: 98.8 F (37.1 C)  SpO2: 98%  Weight:  229 lb (103.9 kg)  Height: '5\' 10"'  (1.778 m)   BMI Assessment: Estimated body mass index is 32.86 kg/m as calculated from the following:   Height as of this encounter: '5\' 10"'  (1.778 m).   Weight as of this encounter: 229 lb (103.9 kg).  BMI interpretation table: BMI level Category Range association with higher incidence of chronic pain  <18 kg/m2 Underweight   18.5-24.9 kg/m2 Ideal body weight   25-29.9 kg/m2 Overweight Increased incidence by 20%  30-34.9 kg/m2 Obese (Class I) Increased incidence by 68%  35-39.9 kg/m2 Severe obesity (Class II) Increased incidence by 136%  >40 kg/m2 Extreme obesity (Class III) Increased incidence by 254%   Patient's current BMI Ideal Body weight  Body mass index is 32.86 kg/m. Ideal body weight: 73 kg (160 lb 15 oz) Adjusted ideal body weight: 85.3 kg (188 lb 2.6 oz)   BMI Readings from Last 4 Encounters:  06/11/19 32.86 kg/m  06/02/19 33.72 kg/m  05/12/19 34.44 kg/m  11/29/18 35.58 kg/m   Wt Readings from Last 4 Encounters:  06/11/19 229 lb (103.9 kg)  06/02/19 235 lb (106.6 kg)  05/12/19 240 lb (108.9 kg)  11/29/18 248 lb (112.5 kg)    Psych/Mental status: Alert, oriented x 3 (person, place, & time)       Eyes: PERLA Respiratory: No evidence of acute respiratory distress  Cervical Spine Area Exam  Skin & Axial Inspection: No masses, redness, edema, swelling, or associated skin lesions Alignment: Symmetrical Functional ROM: Pain restricted ROM, bilaterally Stability: No instability detected Muscle Tone/Strength: Functionally intact. No obvious neuro-muscular anomalies detected. Sensory (Neurological): Dermatomal pain pattern Palpation: No palpable anomalies              Upper Extremity (UE) Exam    Side: Right upper extremity  Side: Left upper extremity  Skin & Extremity Inspection: Skin color, temperature, and hair growth are WNL. No peripheral edema or cyanosis. No masses, redness, swelling, asymmetry, or associated skin  lesions. No contractures.  Skin & Extremity Inspection: Skin color, temperature, and hair growth are WNL. No peripheral edema or cyanosis. No masses, redness, swelling, asymmetry, or associated skin lesions. No contractures.  Functional ROM: Pain restricted ROM          Functional ROM: Pain restricted ROM          Muscle Tone/Strength: Functionally intact. No obvious neuro-muscular anomalies detected.  Muscle Tone/Strength: Functionally intact. No obvious neuro-muscular anomalies detected.  Sensory (Neurological): Unimpaired          Sensory (  Neurological): Unimpaired          Palpation: No palpable anomalies              Palpation: No palpable anomalies              Provocative Test(s):  Phalen's test: deferred Tinel's test: deferred Apley's scratch test (touch opposite shoulder):  Action 1 (Across chest): Decreased ROM Action 2 (Overhead): Decreased ROM Action 3 (LB reach): Decreased ROM   Provocative Test(s):  Phalen's test: deferred Tinel's test: deferred Apley's scratch test (touch opposite shoulder):  Action 1 (Across chest): Decreased ROM Action 2 (Overhead): Decreased ROM Action 3 (LB reach): Decreased ROM    Lumbar Spine Area Exam  Skin & Axial Inspection: No masses, redness, or swelling Alignment: Symmetrical Functional ROM: Pain restricted ROM       Stability: No instability detected Muscle Tone/Strength: Functionally intact. No obvious neuro-muscular anomalies detected. Sensory (Neurological): Musculoskeletal pain pattern   Gait & Posture Assessment  Ambulation: Patient ambulates using a cane Gait: Limited. Using assistive device to ambulate Posture: Difficulty standing up straight, due to pain   Lower Extremity Exam    Side: Right lower extremity  Side: Left lower extremity  Stability: No instability observed          Stability: No instability observed          Skin & Extremity Inspection: Skin color, temperature, and hair growth are WNL. No peripheral edema or  cyanosis. No masses, redness, swelling, asymmetry, or associated skin lesions. No contractures.  Skin & Extremity Inspection: Skin color, temperature, and hair growth are WNL. No peripheral edema or cyanosis. No masses, redness, swelling, asymmetry, or associated skin lesions. No contractures.  Functional ROM: Decreased ROM for hip and knee joints          Functional ROM: Decreased ROM for hip and knee joints          Muscle Tone/Strength: Functionally intact. No obvious neuro-muscular anomalies detected.  Muscle Tone/Strength: Functionally intact. No obvious neuro-muscular anomalies detected.  Sensory (Neurological): Arthropathic arthralgia        Sensory (Neurological): Arthropathic arthralgia        DTR: Patellar: 0: absent Achilles: deferred today Plantar: deferred today  DTR: Patellar: 0: absent Achilles: deferred today Plantar: deferred today  Palpation: No palpable anomalies  Palpation: No palpable anomalies   Assessment  Primary Diagnosis & Pertinent Problem List: The primary encounter diagnosis was Bilateral primary osteoarthritis of knee. Diagnoses of Cervical radicular pain, Cervical facet joint syndrome, Lumbar facet arthropathy, Lumbar radiculopathy, and Chronic pain syndrome were also pertinent to this visit.  Visit Diagnosis (New problems to examiner): 1. Bilateral primary osteoarthritis of knee   2. Cervical radicular pain   3. Cervical facet joint syndrome   4. Lumbar facet arthropathy   5. Lumbar radiculopathy   6. Chronic pain syndrome    Plan of Care (Initial workup plan)  Note: Mario [name] was reminded that as per protocol, today's visit has been an evaluation only. We have not taken over the patient's controlled substance management.  General Recommendations: The pain condition that the patient suffers from is best treated with a multidisciplinary approach that involves an increase in physical activity to prevent de-conditioning and worsening of the pain cycle, as  well as psychological counseling (formal and/or informal) to address the co-morbid psychological affects of pain. Treatment will often involve judicious use of pain medications and interventional procedures to decrease the pain, allowing the patient to participate in the physical activity  that will ultimately produce long-lasting pain reductions. The goal of the multidisciplinary approach is to return the patient to a higher level of overall function and to restore their ability to perform activities of daily living.  Mario [name] is a pleasant 74 year old male who presents with chronic pain affecting multiple locations.  His primary pain is in his bilateral knees.  He has been offered a knee replacement surgery but would like to avoid.  He has tried intra-articular knee injections in the past with steroid which were somewhat effective.  Today we discussed diagnostic genicular nerve block followed possibly by radiofrequency ablation.  I informed the patient that this is an extraarticular nerve block and if helpful can consider something more long-lasting like genicular radiofrequency ablation/neurotomy.  Risks and benefits of this procedure were discussed and patient would like to proceed.  We will do this procedure with sedation.  We will have patient continue his anticoagulation given his cardiac history.  In regards to the patient's neck pain.  This is this is related to significant cervical facet joint syndrome at multiple cervical levels most pronounced at C4-C5, C5-C6 and C6-C7 along with broad-based disc bulges causing bilateral foraminal stenosis at multiple surgical regions.  Of note at T1-T2 there is also a broad-based disc bulge with moderate facet arthropathy and moderate right foraminal stenosis.  We discussed therapeutic options for his condition which could include diagnostic cervical facet medial branch nerve blocks and possible cervical radiofrequency ablation as well as cervical epidural steroid  injection.  Patient will need to stop his anticoagulation for cervical spine procedures and he is high risk to be also will discuss further with him and prescribing provider if we go this route.  I encouraged patient to continue the Cymbalta and gabapentin.  I cautioned the patient on daily Celebrex use as he is already anticoagulated and additional NSAID therapy can increase his risk of hemorrhage.  Patient does find benefit with tramadol which he takes 50 mg daily.  We will have patient complete urine toxicology screen and so long as this is appropriate can take over tramadol at 50 mg daily to twice daily as needed.   Lab Orders     Compliance Drug Analysis, Ur  Procedure Orders     GENICULAR NERVE BLOCK  Pharmacological management options:  Opioid Analgesics: The patient was informed that there is no guarantee that he would be a candidate for opioid analgesics. The decision will be made following CDC guidelines. This decision will be based on the results of diagnostic studies, as well as Mario [name] risk profile.   Membrane stabilizer: Adequate regimen  Muscle relaxant: Not indicated  NSAID: To be determined at a later time  Other analgesic(s): To be determined at a later time   Interventional management options: Mario [name] was informed that there is no guarantee that he would be a candidate for interventional therapies. The decision will be based on the results of diagnostic studies, as well as Mario [name] risk profile.  Procedure(s) under consideration:  Genicular NB possible RFA   Provider-requested follow-up: Return in about 2 weeks (around 06/25/2019) for B/L GNB , with sedation.  Future Appointments  Date Time Provider [place]  06/25/2019  9:00 AM Gillis Santa, MD [facility] None    Note by: Gillis Santa, MD Date: 06/11/2019; Time: 1:07 PM

## 2019-06-11 ENCOUNTER — Other Ambulatory Visit: Payer: Self-pay

## 2019-06-11 ENCOUNTER — Ambulatory Visit
Payer: Medicare Other | Attending: Student in an Organized Health Care Education/Training Program | Admitting: Student in an Organized Health Care Education/Training Program

## 2019-06-11 ENCOUNTER — Encounter: Payer: Self-pay | Admitting: Student in an Organized Health Care Education/Training Program

## 2019-06-11 VITALS — BP 118/84 | HR 94 | Temp 98.8°F | Resp 20 | Ht 70.0 in | Wt 229.0 lb

## 2019-06-11 DIAGNOSIS — M47816 Spondylosis without myelopathy or radiculopathy, lumbar region: Secondary | ICD-10-CM | POA: Diagnosis present

## 2019-06-11 DIAGNOSIS — G8929 Other chronic pain: Secondary | ICD-10-CM | POA: Insufficient documentation

## 2019-06-11 DIAGNOSIS — M5416 Radiculopathy, lumbar region: Secondary | ICD-10-CM

## 2019-06-11 DIAGNOSIS — M5412 Radiculopathy, cervical region: Secondary | ICD-10-CM

## 2019-06-11 DIAGNOSIS — M170 Bilateral primary osteoarthritis of knee: Secondary | ICD-10-CM | POA: Insufficient documentation

## 2019-06-11 DIAGNOSIS — M47812 Spondylosis without myelopathy or radiculopathy, cervical region: Secondary | ICD-10-CM | POA: Insufficient documentation

## 2019-06-11 DIAGNOSIS — G894 Chronic pain syndrome: Secondary | ICD-10-CM | POA: Insufficient documentation

## 2019-06-11 NOTE — Progress Notes (Signed)
Safety precautions to be maintained throughout the outpatient stay will include: orient to surroundings, keep bed in low position, maintain call bell within reach at all times, provide assistance with transfer out of bed and ambulation.  

## 2019-06-12 ENCOUNTER — Ambulatory Visit: Payer: Medicare Other | Admitting: Student in an Organized Health Care Education/Training Program

## 2019-06-12 NOTE — Addendum Note (Signed)
Addended by: Edward Jolly on: 06/12/2019 08:32 AM   Modules accepted: Orders

## 2019-06-13 LAB — SPECIMEN STATUS REPORT

## 2019-06-14 LAB — DRUG SCREEN 10 W/CONF, SERUM

## 2019-06-16 ENCOUNTER — Telehealth: Payer: Self-pay | Admitting: Student in an Organized Health Care Education/Training Program

## 2019-06-16 NOTE — Telephone Encounter (Signed)
[facility] called and left a voicemail wanting clarification on the sample we sent over for his drug screen. [phone]

## 2019-06-16 NOTE — Telephone Encounter (Signed)
Questions resolved.

## 2019-06-20 ENCOUNTER — Telehealth: Payer: Self-pay | Admitting: *Deleted

## 2019-06-20 LAB — COMPLIANCE DRUG ANALYSIS, UR

## 2019-06-20 NOTE — Telephone Encounter (Signed)
Spoke with patient about reminder call for procedure on 06/25/19.  GNB is scheduled with sedation.  He is aware of how to prepare for sedation.  I ask if he is on blood thinners and he is d/t atrial fib,  Savaysa 60 mg daily is what he takes.  I asked if him if there was any discussion about stopping this?  He states there was not.  I told him that we may not have recognized this as it seems it may be a fairly new blood thinner.  I told him I would be in touch with Dr Cherylann Ratel for instruction and let him know how to proceed.

## 2019-06-24 NOTE — Telephone Encounter (Signed)
Patient is aware to stay on blood thinner Savaysa

## 2019-06-25 ENCOUNTER — Ambulatory Visit (HOSPITAL_BASED_OUTPATIENT_CLINIC_OR_DEPARTMENT_OTHER): Payer: Medicare Other | Admitting: Student in an Organized Health Care Education/Training Program

## 2019-06-25 ENCOUNTER — Encounter: Payer: Self-pay | Admitting: Student in an Organized Health Care Education/Training Program

## 2019-06-25 ENCOUNTER — Other Ambulatory Visit: Payer: Self-pay

## 2019-06-25 ENCOUNTER — Ambulatory Visit
Admission: RE | Admit: 2019-06-25 | Discharge: 2019-06-25 | Disposition: A | Discharge status: Home / Self Care | Payer: Medicare Other | Source: Ambulatory Visit | Attending: Student in an Organized Health Care Education/Training Program | Admitting: Student in an Organized Health Care Education/Training Program

## 2019-06-25 ENCOUNTER — Ambulatory Visit: Admission: RE | Admit: 2019-06-25 | Payer: Medicare Other | Source: Ambulatory Visit

## 2019-06-25 VITALS — BP 116/79 | HR 77 | Temp 97.0°F | Resp 14 | Ht 70.0 in | Wt 224.0 lb

## 2019-06-25 DIAGNOSIS — M170 Bilateral primary osteoarthritis of knee: Secondary | ICD-10-CM | POA: Insufficient documentation

## 2019-06-25 MED ORDER — FENTANYL CITRATE (PF) 100 MCG/2ML IJ SOLN
25.0000 ug | INTRAMUSCULAR | Status: DC | PRN
  Administered 2019-06-25: 50 ug via INTRAVENOUS

## 2019-06-25 MED ORDER — TRAMADOL HCL 50 MG PO TABS: 50.0000 mg | ORAL_TABLET | Freq: Two times a day (BID) | ORAL | 0 refills | Status: DC | PRN

## 2019-06-25 MED ORDER — LIDOCAINE HCL 2 % IJ SOLN
20.0000 mL | Freq: Once | INTRAMUSCULAR | Status: AC
  Administered 2019-06-25: 400 mg

## 2019-06-25 MED ORDER — ROPIVACAINE HCL 2 MG/ML IJ SOLN
9.0000 mL | Freq: Once | INTRAMUSCULAR | Status: AC
  Administered 2019-06-25: 10 mL via PERINEURAL

## 2019-06-25 MED ORDER — DEXAMETHASONE SODIUM PHOSPHATE 10 MG/ML IJ SOLN
10.0000 mg | Freq: Once | INTRAMUSCULAR | Status: AC
  Administered 2019-06-25: 10 mg
  Filled 2019-06-25: qty 1

## 2019-06-25 MED ORDER — LIDOCAINE HCL 2 % IJ SOLN
INTRAMUSCULAR | Status: AC
  Filled 2019-06-25: qty 20

## 2019-06-25 MED ORDER — FENTANYL CITRATE (PF) 100 MCG/2ML IJ SOLN
INTRAMUSCULAR | Status: AC
  Filled 2019-06-25: qty 2

## 2019-06-25 MED ORDER — ROPIVACAINE HCL 2 MG/ML IJ SOLN
9.0000 mL | Freq: Once | INTRAMUSCULAR | Status: AC
  Administered 2019-06-25: 10 mL via PERINEURAL
  Filled 2019-06-25: qty 10

## 2019-06-25 MED ORDER — ROPIVACAINE HCL 2 MG/ML IJ SOLN
INTRAMUSCULAR | Status: AC
  Filled 2019-06-25: qty 10

## 2019-06-25 MED ORDER — DEXAMETHASONE SODIUM PHOSPHATE 10 MG/ML IJ SOLN
20.0000 mg | Freq: Once | INTRAMUSCULAR | Status: AC
  Administered 2019-06-25: 10:00:00 10 mg

## 2019-06-25 MED ORDER — DEXAMETHASONE SODIUM PHOSPHATE 10 MG/ML IJ SOLN
INTRAMUSCULAR | Status: AC
  Filled 2019-06-25: qty 1

## 2019-06-25 NOTE — Patient Instructions (Addendum)
A prescription for Tramadol has been sent to your pharmacy.Post-procedure Information What to expect: Most procedures involve the use of a local anesthetic (numbing medicine), and a steroid (anti-inflammatory medicine).  The local anesthetics may cause temporary numbness and weakness of the legs or arms, depending on the location of the block. This numbness/weakness may last 4-6 hours, depending on the local anesthetic used. In rare instances, it can last up to 24 hours. While numb, you must be very careful not to injure the extremity.  After any procedure, you could expect the pain to get better within 15-20 minutes. This relief is temporary and may last 4-6 hours. Once the local anesthetics wears off, you could experience discomfort, possibly more than usual, for up to 10 (ten) days. In the case of radiofrequencies, it may last up to 6 weeks. Surgeries may take up to 8 weeks for the healing process. The discomfort is due to the irritation caused by needles going through skin and muscle. To minimize the discomfort, we recommend using ice the first day, and heat from then on. The ice should be applied for 15 minutes on, and 15 minutes off. Keep repeating this cycle until bedtime. Avoid applying the ice directly to the skin, to prevent frostbite. Heat should be used daily, until the pain improves (4-10 days). Be careful not to burn yourself.  Occasionally you may experience muscle spasms or cramps. These occur as a consequence of the irritation caused by the needle sticks to the muscle and the blood that will inevitably be lost into the surrounding muscle tissue. Blood tends to be very irritating to tissues, which tend to react by going into spasm. These spasms may start the same day of your procedure, but they may also take days to develop. This late onset type of spasm or cramp is usually caused by electrolyte imbalances triggered by the steroids, at the level of the kidney. Cramps and spasms tend to  respond well to muscle relaxants, multivitamins (some are triggered by the procedure, but may have their origins in vitamin deficiencies), and "Gatorade", or any sports drinks that can replenish any electrolyte imbalances. (If you are a diabetic, ask your pharmacist to get you a sugar-free brand.) Warm showers or baths may also be helpful. Stretching exercises are highly recommended. General Instructions:  Be alert for signs of possible infection: redness, swelling, heat, red streaks, elevated temperature, and/or fever. These typically appear 4 to 6 days after the procedure. Immediately notify your doctor if you experience unusual bleeding, difficulty breathing, or loss of bowel or bladder control. If you experience increased pain, do not increase your pain medicine intake, unless instructed by your pain physician. Post-Procedure Care:  Be careful in moving about. Muscle spasms in the area of the injection may occur. Applying ice or heat to the area is often helpful. The incidence of spinal headaches after epidural injections ranges between 1.4% and 6%. If you develop a headache that does not seem to respond to conservative therapy, please let your physician know. This can be treated with an epidural blood patch.   Post-procedure numbness or redness is to be expected, however it should average 4 to 6 hours. If numbness and weakness of your extremities begins to develop 4 to 6 hours after your procedure, and is felt to be progressing and worsening, immediately contact your physician.   Diet:  If you experience nausea, do not eat until this sensation goes away. If you had a "Stellate Ganglion Block" for upper extremity "Reflex  Sympathetic Dystrophy", do not eat or drink until your hoarseness goes away. In any case, always start with liquids first and if you tolerate them well, then slowly progress to more solid foods. Activity:  For the first 4 to 6 hours after the procedure, use caution in moving about as  you may experience numbness and/or weakness. Use caution in cooking, using household electrical appliances, and climbing steps. If you need to reach your Doctor call our office: [phone]) [phone] Monday-Thursday 8:00 am - 4:00 PM    Fridays: Closed     In case of an emergency: In case of emergency, call 911 or go to the nearest emergency room and have the physician there call us.  Interpretation of Procedure Every nerve block has two components: a diagnostic component, and a treatment component. Unrealistic expectations are the most common causes of "perceived failure".  In a perfect world, a single nerve block should be able to completely and permanently eliminate the pain. Sadly, the world is not perfect.  Most pain management nerve blocks are performed using local anesthetics and steroids. Steroids are responsible for any long-term benefit that you may experience. Their purpose is to decrease any chronic swelling that may exist in the area. Steroids begin to work immediately after being injected. However, most patients will not experience any benefits until 5 to 10 days after the injection, when the swelling has come down to the point where they can tell a difference. Steroids will only help if there is swelling to be treated. As such, they can assist with the diagnosis. If effective, they suggest an inflammatory component to the pain, and if ineffective, they rule out inflammation as the main cause or component of the problem. If the problem is one of mechanical compression, you will get no benefit from those steroids.   In the case of local anesthetics, they have a crucial role in the diagnosis of your condition. Most will begin to work within15 to 20 minutes after injection. The duration will depend on the type used (short- vs. Long-acting). It is of outmost importance that patients keep tract of their pain, after the procedure. To assist with this matter, a "Post-procedure Pain Diary" is provided.  Make sure to complete it and to bring it back to your follow-up appointment.  As long as the patient keeps accurate, detailed records of their symptoms after every procedure, and returns to have those interpreted, every procedure will provide [place] with invaluable information. Even a block that does not provide the patient with any relief, will always provide [place] with information about the mechanism and the origin of the pain. The only time a nerve block can be considered a waste of time is when patients do not keep track of the results, or do not keep their post-procedure appointment.  Reporting the results back to your physician The Pain Score  Pain is a subjective complaint. It cannot be seen, touched, or measured. We depend entirely on the patient's report of the pain in order to assess your condition and treatment. To evaluate the pain, we use a pain scale, where "0" means "No Pain", and a "10" is "the worst possible pain that you can even imagine" (i.e. something like been eaten alive by a shark or being torn apart by a lion).   You will frequently be asked to rate your pain. Please be as accurate, remember that medical decisions will be based on your responses. Please do not rate your pain above a 10. Doing  so is actually interpreted as "symptom magnification" (exaggeration), as well as lack of understanding with regards to the scale. To put this into perspective, when you tell us that your pain is at a 10 (ten), what you are saying is that there is nothing we can do to make this pain any worse. (Carefully think about that.)

## 2019-06-25 NOTE — Progress Notes (Signed)
Safety precautions to be maintained throughout the outpatient stay will include: orient to surroundings, keep bed in low position, maintain call bell within reach at all times, provide assistance with transfer out of bed and ambulation.  

## 2019-06-25 NOTE — Progress Notes (Signed)
PROVIDER NOTE: Information contained herein reflects review and annotations entered in association with encounter. Interpretation of such information and data should be left to medically-trained personnel. Information provided to patient can be located elsewhere in the medical record under "Patient Instructions". Document created using STT-dictation technology, any transcriptional errors that may result from process are unintentional.    Patient: Mario [name]  Service Category: Procedure  Provider: Edward Jolly, MD  DOB: 1945-08-06  DOS: 06/25/2019  Location: [facility]  MRN: [id number]  Setting: Ambulatory - outpatient  Referring Provider: Myrene Buddy, *  Type: Established Patient  Specialty: Interventional Pain Management  PCP: Myrene Buddy, NP   Primary Reason for Visit: Interventional Pain Management Treatment. CC: Knee Pain (bilateral)  Procedure:          Anesthesia, Analgesia, Anxiolysis:  Type: Genicular Nerves Block (Superior-lateral, Superior-medial, and Inferior-medial Genicular Nerves) #1  CPT: 64450      Primary Purpose: Diagnostic Region: Lateral, Anterior, and Medial aspects of the knee joint, above and below the knee joint proper. Level: Superior and inferior to the knee joint. Target Area: For Genicular Nerve block(s), the targets are: the superior-lateral genicular nerve, located in the lateral distal portion of the femoral shaft as it curves to form the lateral epicondyle, in the region of the distal femoral metaphysis; the superior-medial genicular nerve, located in the medial distal portion of the femoral shaft as it curves to form the medial epicondyle; and the inferior-medial genicular nerve, located in the medial, proximal portion of the tibial shaft, as it curves to form the medial epicondyle, in the region of the proximal tibial metaphysis. Approach: Anterior, percutaneous, ipsilateral approach. Laterality: Bilateral  Type: Moderate  (Conscious) Sedation combined with Local Anesthesia Indication(s): Analgesia and Anxiety Route: Intravenous (IV) IV Access: Secured Sedation: Meaningful verbal contact was maintained at all times during the procedure  Local Anesthetic: Lidocaine 1-2%  Position: Modified Fowler's position with pillows under the targeted knee(s).   Indications: 1. Bilateral primary osteoarthritis of knee    Pain Score: Pre-procedure: 6 /10 Post-procedure: 0-No pain/10   Pre-op Assessment:  Mario [name] is a 74 y.o. (year old), male patient, seen today for interventional treatment. He  has a past surgical history that includes Wrist surgery (Left); Knee surgery (Right); Colonoscopy (08/30/2015); Cataract extraction w/PHACO (Left, 05/12/2019); Finger surgery (Right); and Cataract extraction w/PHACO (Right, 06/02/2019). Mario [name] has a current medication list which includes the following prescription(s): albuterol, apple cider vinegar, celecoxib, duloxetine, edoxaban, fluticasone-umeclidin-vilant, furosemide, gabapentin, glucose blood, ipratropium-albuterol, metformin, ozempic (1 mg/dose), sotalol af, tramadol, turmeric, gabapentin, metformin, rocklatan, sotalol, tramadol, and [DISCONTINUED] losartan, and the following Facility-Administered Medications: fentanyl. His primarily concern today is the Knee Pain (bilateral)  Initial Vital Signs:  Pulse/HCG Rate: 77ECG Heart Rate: 64 Temp: (!) 97.1 F (36.2 C) Resp: 18 BP: 137/90 SpO2: 96 %  BMI: Estimated body mass index is 32.14 kg/m as calculated from the following:   Height as of this encounter: 5\' 10"  (1.778 m).   Weight as of this encounter: 224 lb (101.6 kg).  Risk Assessment: Allergies: Reviewed. He is allergic to rosuvastatin; ace inhibitors; lisinopril; and simbrinza [brinzolamide-brimonidine].  Allergy Precautions: None required Coagulopathies: Reviewed. None identified.  Blood-thinner therapy: None at this time Active Infection(s): Reviewed. None  identified. Mario [name] is afebrile  Site Confirmation: Mario [name] was asked to confirm the procedure and laterality before marking the site Procedure checklist: Completed Consent: Before the procedure and under the influence of no sedative(s), amnesic(s), or anxiolytics, the  patient was informed of the treatment options, risks and possible complications. To fulfill our ethical and legal obligations, as recommended by the American Medical Association's Code of Ethics, I have informed the patient of my clinical impression; the nature and purpose of the treatment or procedure; the risks, benefits, and possible complications of the intervention; the alternatives, including doing nothing; the risk(s) and benefit(s) of the alternative treatment(s) or procedure(s); and the risk(s) and benefit(s) of doing nothing. The patient was provided information about the general risks and possible complications associated with the procedure. These may include, but are not limited to: failure to achieve desired goals, infection, bleeding, organ or nerve damage, allergic reactions, paralysis, and death. In addition, the patient was informed of those risks and complications associated to the procedure, such as failure to decrease pain; infection; bleeding; organ or nerve damage with subsequent damage to sensory, motor, and/or autonomic systems, resulting in permanent pain, numbness, and/or weakness of one or several areas of the body; allergic reactions; (i.e.: anaphylactic reaction); and/or death. Furthermore, the patient was informed of those risks and complications associated with the medications. These include, but are not limited to: allergic reactions (i.e.: anaphylactic or anaphylactoid reaction(s)); adrenal axis suppression; blood sugar elevation that in diabetics may result in ketoacidosis or comma; water retention that in patients with history of congestive heart failure may result in shortness of breath, pulmonary edema,  and decompensation with resultant heart failure; weight gain; swelling or edema; medication-induced neural toxicity; particulate matter embolism and blood vessel occlusion with resultant organ, and/or nervous system infarction; and/or aseptic necrosis of one or more joints. Finally, the patient was informed that Medicine is not an exact science; therefore, there is also the possibility of unforeseen or unpredictable risks and/or possible complications that may result in a catastrophic outcome. The patient indicated having understood very clearly. We have given the patient no guarantees and we have made no promises. Enough time was given to the patient to ask questions, all of which were answered to the patient's satisfaction. Mr. Teschner has indicated that he wanted to continue with the procedure. Attestation: I, the ordering provider, attest that I have discussed with the patient the benefits, risks, side-effects, alternatives, likelihood of achieving goals, and potential problems during recovery for the procedure that I have provided informed consent. Date  Time: 06/25/2019  8:47 AM  Pre-Procedure Preparation:  Monitoring: As per clinic protocol. Respiration, ETCO2, SpO2, BP, heart rate and rhythm monitor placed and checked for adequate function Safety Precautions: Patient was assessed for positional comfort and pressure points before starting the procedure. Time-out: I initiated and conducted the "Time-out" before starting the procedure, as per protocol. The patient was asked to participate by confirming the accuracy of the "Time Out" information. Verification of the correct person, site, and procedure were performed and confirmed by me, the nursing staff, and the patient. "Time-out" conducted as per Joint Commission's Universal Protocol (UP.01.01.01). Time: 0950  Description of Procedure:          Area Prepped: Entire knee area, from mid-thigh to mid-shin, lateral, anterior, and medial  aspects. Prepping solution: DuraPrep (Iodine Povacrylex [0.7% available iodine] and Isopropyl Alcohol, 74% w/w) Safety Precautions: Aspiration looking for blood return was conducted prior to all injections. At no point did we inject any substances, as a needle was being advanced. No attempts were made at seeking any paresthesias. Safe injection practices and needle disposal techniques used. Medications properly checked for expiration dates. SDV (single dose vial) medications used. Description of the  Procedure: Protocol guidelines were followed. The patient was placed in position over the procedure table. The target area was identified and the area prepped in the usual manner. Skin & deeper tissues infiltrated with local anesthetic. Appropriate amount of time allowed to pass for local anesthetics to take effect. The procedure needles were then advanced to the target area. Proper needle placement secured. Negative aspiration confirmed. Solution injected in intermittent fashion, asking for systemic symptoms every 0.5cc of injectate. The needles were then removed and the area cleansed, making sure to leave some of the prepping solution back to take advantage of its long term bactericidal properties.  Vitals:   06/25/19 1007 06/25/19 1016 06/25/19 1026 06/25/19 1036  BP: (!) 160/112 (!) 139/91 136/88 116/79  Pulse:      Resp: 12 17 16 14   Temp:  (!) 97.4 F (36.3 C)  (!) 97 F (36.1 C)  SpO2: 100% 100% 100% 100%  Weight:      Height:        Start Time: 0951 hrs. End Time: 1008 hrs. Materials:  Needle(s) Type: Spinal Needle Gauge: 25G Length: 3.5-in Medication(s): Please see orders for medications and dosing details. 6 cc solution made of 5 cc of 0.2% ropivacaine, 1 cc of Decadron 10 mg/cc.  2 cc injected at each level above for the left knee. 6 cc solution made of 5 cc of 0.2% ropivacaine, 1 cc of Decadron 10 mg/cc.  2 cc injected at each level above for the right knee. Total steroid dose: 20 mg  of Decadron Imaging Guidance (Non-Spinal):          Type of Imaging Technique: Fluoroscopy Guidance (Non-Spinal) Indication(s): Assistance in needle guidance and placement for procedures requiring needle placement in or near specific anatomical locations not easily accessible without such assistance. Exposure Time: Please see nurses notes. Contrast: None used. Fluoroscopic Guidance: I was personally present during the use of fluoroscopy. "Tunnel Vision Technique" used to obtain the best possible view of the target area. Parallax error corrected before commencing the procedure. "Direction-depth-direction" technique used to introduce the needle under continuous pulsed fluoroscopy. Once target was reached, antero-posterior, oblique, and lateral fluoroscopic projection used confirm needle placement in all planes. Images permanently stored in EMR. Interpretation: No contrast injected. I personally interpreted the imaging intraoperatively. Adequate needle placement confirmed in multiple planes. Permanent images saved into the patient's record.  Antibiotic Prophylaxis:   Anti-infectives (From admission, onward)   None     Indication(s): None identified  Post-operative Assessment:  Post-procedure Vital Signs:  Pulse/HCG Rate: 7765 Temp: (!) 97 F (36.1 C) Resp: 14 BP: 116/79 SpO2: 100 %  EBL: None  Complications: No immediate post-treatment complications observed by team, or reported by patient.  Note: The patient tolerated the entire procedure well. A repeat set of vitals were taken after the procedure and the patient was kept under observation following institutional policy, for this type of procedure. Post-procedural neurological assessment was performed, showing return to baseline, prior to discharge. The patient was provided with post-procedure discharge instructions, including a section on how to identify potential problems. Should any problems arise concerning this procedure, the patient  was given instructions to immediately contact , at any time, without hesitation. In any case, we plan to contact the patient by telephone for a follow-up status report regarding this interventional procedure.  Comments:  No additional relevant information.  Plan of Care  Orders:  Orders Placed This Encounter  Procedures  . DG PA[facility] C-ARM 1-60 MIN NO REPORT  Intraoperative interpretation by procedural physician at [facility].    Standing Status:   Standing    Number of Occurrences:   1    Order Specific Question:   Reason for exam:    Answer:   Assistance in needle guidance and placement for procedures requiring needle placement in or near specific anatomical locations not easily accessible without such assistance.   Chronic Opioid Analgesic:  Will take over patient's tramadol prescription today.  UDS performed on 2/15 appropriate,   Tramadol 50 mg daily as needed.    Medications ordered for procedure: Meds ordered this encounter  Medications  . lidocaine (XYLOCAINE) 2 % (with pres) injection 400 mg  . fentaNYL (SUBLIMAZE) injection 25-50 mcg    Make sure Narcan is available in the pyxis when using this medication. In the event of respiratory depression (RR< 8/min): Titrate NARCAN (naloxone) in increments of 0.1 to 0.2 mg IV at 2-3 minute intervals, until desired degree of reversal.  . ropivacaine (PF) 2 mg/mL (0.2%) (NAROPIN) injection 9 mL  . ropivacaine (PF) 2 mg/mL (0.2%) (NAROPIN) injection 9 mL  . dexamethasone (DECADRON) injection 10 mg  . dexamethasone (DECADRON) injection 20 mg  . traMADol (ULTRAM) 50 MG tablet    Sig: Take 1 tablet (50 mg total) by mouth every 12 (twelve) hours as needed for severe pain.    Dispense:  30 tablet    Refill:  0    Fill one day early if pharmacy is closed on scheduled refill date. Do not fill until: To last until:   Medications administered: We administered lidocaine, fentaNYL, ropivacaine (PF) 2 mg/mL (0.2%), ropivacaine  (PF) 2 mg/mL (0.2%), dexamethasone, and dexamethasone.  See the medical record for exact dosing, route, and time of administration.  Follow-up plan:   Return in about 4 weeks (around 07/23/2019) for Medication Management, Post Procedure Evaluation, virtual.      Status post genicular nerve block #1 06/25/2019   Recent Visits Date Type Provider Dept  06/11/19 Office Visit Gillis Santa, MD [facility]  Showing recent visits within past 90 days and meeting all other requirements   Today's Visits Date Type Provider Dept  06/25/19 Procedure visit Gillis Santa, MD [facility]  Showing today's visits and meeting all other requirements   Future Appointments Date Type Provider Dept  07/22/19 Appointment Gillis Santa, MD [facility]  Showing future appointments within next 90 days and meeting all other requirements   Disposition: Discharge home  Discharge (Date  Time): 06/25/2019; 1036 hrs.   Primary Care Physician: Sallee Lange, NP Location: [facility] Note by: Gillis Santa, MD Date: 06/25/2019; Time: 11:07 AM  Disclaimer:  Medicine is not an exact science. The only guarantee in medicine is that nothing is guaranteed. It is important to note that the decision to proceed with this intervention was based on the information collected from the patient. The Data and conclusions were drawn from the patient's questionnaire, the interview, and the physical examination. Because the information was provided in large part by the patient, it cannot be guaranteed that it has not been purposely or unconsciously manipulated. Every effort has been made to obtain as much relevant data as possible for this evaluation. It is important to note that the conclusions that lead to this procedure are derived in large part from the available data. Always take into account that the treatment will also be dependent on availability of resources and  existing treatment guidelines, considered by other  Pain Management Practitioners as being common knowledge and practice, at the time of the intervention. For Medico-Legal purposes, it is also important to point out that variation in procedural techniques and pharmacological choices are the acceptable norm. The indications, contraindications, technique, and results of the above procedure should only be interpreted and judged by a Board-Certified Interventional Pain Specialist with extensive familiarity and expertise in the same exact procedure and technique.

## 2019-06-26 ENCOUNTER — Telehealth: Payer: Self-pay

## 2019-06-26 NOTE — Telephone Encounter (Signed)
Post procedure phone call.  LM 

## 2019-06-26 NOTE — Telephone Encounter (Signed)
Pt returned the call. Please give him a call.                                                  Thanks  

## 2019-07-21 ENCOUNTER — Telehealth: Payer: Self-pay

## 2019-07-21 NOTE — Telephone Encounter (Signed)
LM for patient to call office for pre virtual

## 2019-07-22 ENCOUNTER — Encounter: Payer: Self-pay | Admitting: Student in an Organized Health Care Education/Training Program

## 2019-07-22 ENCOUNTER — Other Ambulatory Visit: Payer: Self-pay

## 2019-07-22 ENCOUNTER — Ambulatory Visit
Payer: Medicare Other | Attending: Student in an Organized Health Care Education/Training Program | Admitting: Student in an Organized Health Care Education/Training Program

## 2019-07-22 DIAGNOSIS — M4722 Other spondylosis with radiculopathy, cervical region: Secondary | ICD-10-CM

## 2019-07-22 DIAGNOSIS — G894 Chronic pain syndrome: Secondary | ICD-10-CM

## 2019-07-22 DIAGNOSIS — M47812 Spondylosis without myelopathy or radiculopathy, cervical region: Secondary | ICD-10-CM

## 2019-07-22 DIAGNOSIS — M170 Bilateral primary osteoarthritis of knee: Secondary | ICD-10-CM | POA: Diagnosis not present

## 2019-07-22 DIAGNOSIS — M5412 Radiculopathy, cervical region: Secondary | ICD-10-CM

## 2019-07-22 MED ORDER — DICLOFENAC SODIUM 1 % EX GEL: 4.0000 g | Freq: Four times a day (QID) | CUTANEOUS | 2 refills | Status: AC

## 2019-07-22 NOTE — Progress Notes (Signed)
Patient: Mario [name]  Service Category: E/M  Provider: Gillis Santa, MD  DOB: 1945-12-08  DOS: 07/22/2019  Location: Office  MRN: [id number]  Setting: Ambulatory outpatient  Referring Provider: Sallee Lange, *  Type: Established Patient  Specialty: Interventional Pain Management  PCP: Sallee Lange, NP  Location: Home  Delivery: TeleHealth     Virtual Encounter - Pain Management PROVIDER NOTE: Information contained herein reflects review and annotations entered in association with encounter. Interpretation of such information and data should be left to medically-trained personnel. Information provided to patient can be located elsewhere in the medical record under "Patient Instructions". Document created using STT-dictation technology, any transcriptional errors that may result from process are unintentional.    Contact & Pharmacy Preferred: [phone] Home: [phone] (home) Mobile: [phone] (mobile) E-mail: [email or web address]  [facility] [place], [address] [place] [address] Phone: [phone] Fax: [phone]  [id number] - [place], [place] - [address] [place] [address] Phone: [phone] Fax: [phone]   Pre-screening  Mario [name] offered "in-person" vs "virtual" encounter. He indicated preferring virtual for this encounter.   Reason COVID-19*  Social distancing based on CDC and AMA recommendations.   I contacted Mario [name] on 07/22/2019 via telephone.      I clearly identified myself as Gillis Santa, MD. I verified that I was speaking with the correct person using two identifiers (Name: Mario [name], and date of birth: 1946/01/17).  Consent I sought verbal advanced consent from Mario [name] for virtual visit interactions. I informed Mario [name] of possible security and privacy concerns, risks, and limitations associated  with providing "not-in-person" medical evaluation and management services. I also informed Mario [name] of the availability of "in-person" appointments. Finally, I informed him that there would be a charge for the virtual visit and that he could be  personally, fully or partially, financially responsible for it. Mario [name] expressed understanding and agreed to proceed.   Historic Elements   Mario [name] is a 74 y.o. year old, male patient evaluated today after his last contact with our practice on 07/21/2019. Mario [name]  has a past medical history of Arthritis, Atrial fibrillation ([place]), CHF (congestive heart failure) ([place]), COPD (chronic obstructive pulmonary disease) ([place]), Diabetes mellitus without complication ([place]), Diabetic neuropathy ([place]), Heart murmur, Hypertension, Neuromuscular disorder ([place]), Neuropathy, Pulmonary embolism ([place]), Shingles ('99), Sleep apnea, and Wears dentures. He also  has a past surgical history that includes Wrist surgery (Left); Knee surgery (Right); Colonoscopy (08/30/2015); Cataract extraction w/PHACO (Left, 05/12/2019); Finger surgery (Right); and Cataract extraction w/PHACO (Right, 06/02/2019). Mario [name] has a current medication list which includes the following prescription(s): albuterol, apple cider vinegar, celecoxib, duloxetine, edoxaban, fluticasone-umeclidin-vilant, furosemide, gabapentin, glucose blood, ipratropium-albuterol, metformin, ozempic (1 mg/dose), sotalol, sotalol af, tramadol, turmeric, diclofenac sodium, gabapentin, metformin, rocklatan, and [DISCONTINUED] losartan. He  reports that he quit smoking about 50 years ago. His smoking use included cigarettes. He has a 0.50 pack-year smoking history. He has never used smokeless tobacco. He reports that he does not drink alcohol or use drugs. Mario [name] is allergic to rosuvastatin; ace inhibitors; lisinopril; and simbrinza [brinzolamide-brimonidine].   HPI  Today, he is being contacted for a post-procedure assessment.    Bilateral genicular nerve block 06/2419  relief after procedure (treated area only): (Questions asked to patient) 1. Starting about 15 minutes after the procedure, and "while the area  was still numb" (from the local anesthetics), were you having any of your usual pain "in that area" (the treated area)?  (NOTE: NOT including the discomfort from the needle sticks.) First 1 hour: 80 % better. First 4-6 hours:80 % better. 2. How long did the numbness from the local anesthetics last? (More than 6 hours?) Duration: 6 hours.  3. How much better is your pain now, when compared to before the procedure? Current benefit:100 % better. 4. Can you move better now? Improvement in ROM (Range of Motion): Yes. 5. Can you do more now? Improvement in function: Yes. 4. Did you have any problems with the procedure? Side-effects/Complications: No.  SIGNIFICANT PAIN RELIEF WITH GENICULAR NB, repeat PRN  Pharmacotherapy Assessment  Analgesic:   Tramadol 50 mg daily as needed.   Monitoring: Mario [name] PMP: PDMP reviewed during this encounter.       Pharmacotherapy: No side-effects or adverse reactions reported. Compliance: No problems identified. Effectiveness: Clinically acceptable. Plan: Refer to "POC".  UDS:  Summary  Date Value Ref Range Status  06/16/2019 Note  Final    Comment:    ==================================================================== Compliance Drug Analysis, Ur ==================================================================== Test                             Result       Flag       Units Drug Present and Declared for Prescription Verification   Tramadol                       >3906        EXPECTED   ng/mg creat   O-Desmethyltramadol            3867         EXPECTED   ng/mg creat   N-Desmethyltramadol            1448         EXPECTED   ng/mg creat    Source of tramadol is a prescription medication. O-desmethyltramadol    and N-desmethyltramadol are expected metabolites of  tramadol.   Gabapentin                     PRESENT      EXPECTED   Duloxetine                     PRESENT      EXPECTED ==================================================================== Test                      Result    Flag   Units      Ref Range   Creatinine              128              mg/dL      >=20 ==================================================================== Declared Medications:  The flagging and interpretation on this report are based on the  following declared medications.  Unexpected results may arise from  inaccuracies in the declared medications.  **Note: The testing scope of this panel includes these medications:  Duloxetine (Cymbalta)  Gabapentin (Neurontin)  Tramadol  **Note: The testing scope of this panel does not include the  following reported medications:  Albuterol (Ventolin HFA)  Albuterol (Duoneb)  Celecoxib (Celebrex)  Edoxaban  Fluticasone  Furosemide  Ipratropium (Duoneb)  Losartan (Cozaar)  Metformin (Glucophage)  Semaglutide (Ozempic)  Sotalol (Betapace)  Supplement  Turmeric ====================================================================  For clinical consultation, please call [phone]. ====================================================================    Laboratory Chemistry Profile   Renal Lab Results  Component Value Date   BUN 24 02/15/2016   CREATININE 1.22 02/15/2016   BCR 20 02/15/2016   GFRAA 69 02/15/2016   GFRNONAA 60 02/15/2016    Hepatic Lab Results  Component Value Date   ALBUMIN 4.3 02/15/2016    Electrolytes Lab Results  Component Value Date   NA 141 02/15/2016   K 4.2 02/15/2016   CL 97 02/15/2016   CALCIUM 9.5 02/15/2016   PHOS 3.2 02/15/2016    Bone No results found for: VD25OH, VD125OH2TOT, LZ7673AL9, FX9024OX7, 25OHVITD1, 25OHVITD2, 25OHVITD3, TESTOFREE, TESTOSTERONE  Inflammation (CRP: Acute Phase) (ESR: Chronic Phase) No results found for: CRP, ESRSEDRATE, LATICACIDVEN    Note:  Above Lab results reviewed.  Imaging  DG PAIN CLINIC C-ARM 1-60 MIN NO REPORT Fluoro was used, but no Radiologist interpretation will be provided.  Please refer to "NOTES" tab for provider progress note.  Assessment  The primary encounter diagnosis was Bilateral primary osteoarthritis of knee. Diagnoses of Cervical radicular pain, Cervical facet joint syndrome, and Chronic pain syndrome were also pertinent to this visit.  Plan of Care  Mario [name] has a current medication list which includes the following long-term medication(s): albuterol, duloxetine, furosemide, gabapentin, ipratropium-albuterol, metformin, sotalol, sotalol af, gabapentin, metformin, and [DISCONTINUED] losartan.  Pharmacotherapy (Medications Ordered): Meds ordered this encounter  Medications  . diclofenac Sodium (VOLTAREN) 1 % GEL    Sig: Apply 4 g topically 4 (four) times daily.    Dispense:  100 g    Refill:  2    Do not add this medication to the electronic "Automatic Refill" notification system. Patient may have prescription filled one day early if pharmacy is closed on scheduled refill date.   Orders:  Orders Placed This Encounter  Procedures  . GENICULAR NERVE BLOCK    For knee pain.    Standing Status:   Standing    Number of Occurrences:   1    Standing Expiration Date:   07/21/2020    Scheduling Instructions:     Side(s): Bilateral Knee     Level(s): Superior-Lateral, Superior-Medial, and Inferior-Medial Genicular Nerves     Sedation: With Sedation.     TIMEFRAME: PRN procedure. (Mr. Coury will call when needed.)     Ok to continue blood thinner    Order Specific Question:   Where will this procedure be performed?    Answer:   [facility]   Follow-up plan:   Return if symptoms worsen or fail to improve.     Status post genicular nerve block #1 06/25/2019- helped significantly repeat prn    Recent Visits Date Type Provider Dept  06/25/19 Procedure visit Gillis Santa, MD [facility]  06/11/19 Office Visit Gillis Santa, MD [facility]  Showing recent visits within past 90 days and meeting all other requirements   Today's Visits Date Type Provider Dept  07/22/19 Office Visit Gillis Santa, MD [facility]  Showing today's visits and meeting all other requirements   Future Appointments No visits were found meeting these conditions.  Showing future appointments within next 90 days and meeting all other requirements   I discussed the assessment and treatment plan with the patient. The patient was provided an opportunity to ask questions and all were answered. The patient agreed with the plan and demonstrated an understanding of the instructions.  Patient advised to call back or seek  an in-person evaluation if the symptoms or condition worsens.  Duration of encounter: 25 minutes.  Note by: Gillis Santa, MD Date: 07/22/2019; Time: 12:12 PM

## 2019-07-22 NOTE — Progress Notes (Signed)
Pain relief after procedure (treated area only): (Questions asked to patient) 1. Starting about 15 minutes after the procedure, and "while the area was still numb" (from the local anesthetics), were you having any of your usual pain "in that area" (the treated area)?  (NOTE: NOT including the discomfort from the needle sticks.) First 1 hour: 80 % better. First 4-6 hours:80 % better. 2. How long did the numbness from the local anesthetics last? (More than 6 hours?) Duration: 6 hours.  3. How much better is your pain now, when compared to before the procedure? Current benefit:100 % better. 4. Can you move better now? Improvement in ROM (Range of Motion): Yes. 5. Can you do more now? Improvement in function: Yes. 4. Did you have any problems with the procedure? Side-effects/Complications: No.

## 2019-09-05 ENCOUNTER — Encounter: Payer: Self-pay | Admitting: *Deleted

## 2019-09-05 ENCOUNTER — Other Ambulatory Visit: Payer: Self-pay

## 2019-09-05 ENCOUNTER — Encounter: Payer: Medicare Other | Attending: Specialist | Admitting: *Deleted

## 2019-09-05 DIAGNOSIS — G4730 Sleep apnea, unspecified: Secondary | ICD-10-CM | POA: Insufficient documentation

## 2019-09-05 DIAGNOSIS — J449 Chronic obstructive pulmonary disease, unspecified: Secondary | ICD-10-CM | POA: Insufficient documentation

## 2019-09-05 NOTE — Progress Notes (Signed)
Virtual Orientation completed today. Exercise evaluation scheduled for Monday 5/10 with gym orientation. Documentation for diagnosis can be found in [facility] 4/21Virtual Orientation completed today. Exercise evaluation scheduled for Monday 5/10 with gym orientation. Documentation for diagnosis can be found in [facility] 4/21

## 2019-09-08 ENCOUNTER — Other Ambulatory Visit: Payer: Self-pay

## 2019-09-08 VITALS — Ht 69.5 in | Wt 221.3 lb

## 2019-09-08 DIAGNOSIS — J449 Chronic obstructive pulmonary disease, unspecified: Secondary | ICD-10-CM | POA: Diagnosis not present

## 2019-09-08 DIAGNOSIS — G4730 Sleep apnea, unspecified: Secondary | ICD-10-CM | POA: Diagnosis not present

## 2019-09-08 NOTE — Patient Instructions (Signed)
Patient Instructions  Patient Details  Name: Mario [name] MRN: [id number] Date of Birth: 1945/10/09 Referring Provider:  Erby Pian, MD  Below are your personal goals for exercise, nutrition, and risk factors. Our goal is to help you stay on track towards obtaining and maintaining these goals. We will be discussing your progress on these goals with you throughout the program.  Initial Exercise Prescription: Initial Exercise Prescription - 09/08/19 1200      Date of Initial Exercise RX and Referring Provider   Date  09/08/19    Referring Provider  [name]      Treadmill   MPH  1.5    Grade  0    Minutes  15      Recumbant Bike   Level  1    RPM  60    Minutes  15    METs  1.5      NuStep   Level  1    SPM  80    Minutes  15    METs  1.5      REL-XR   Level  1    Speed  50    Minutes  15    METs  1.5      Prescription Details   Frequency (times per week)  3    Duration  Progress to 30 minutes of continuous aerobic without signs/symptoms of physical distress      Intensity   THRR 40-80% of Max Heartrate  113-136    Ratings of Perceived Exertion  11-15    Perceived Dyspnea  0-4      Resistance Training   Training Prescription  Yes    Weight  3 lb    Reps  10-15       Exercise Goals: Frequency: Be able to perform aerobic exercise two to three times per week in program working toward 2-5 days per week of home exercise.  Intensity: Work with a perceived exertion of 11 (fairly light) - 15 (hard) while following your exercise prescription.  We will make changes to your prescription with you as you progress through the program.   Duration: Be able to do 30 to 45 minutes of continuous aerobic exercise in addition to a 5 minute warm-up and a 5 minute cool-down routine.   Nutrition Goals: Your personal nutrition goals will be established when you do your nutrition analysis with the dietician.  The following are general nutrition guidelines to  follow: Cholesterol < 200mg /day Sodium < 1500mg /day Fiber: Men over 50 yrs - 30 grams per day  Personal Goals: Personal Goals and Risk Factors at Admission - 09/08/19 1314      Core Components/Risk Factors/Patient Goals on Admission    Weight Management  Weight Loss;Yes    Intervention  Weight Management: Develop a combined nutrition and exercise program designed to reach desired caloric intake, while maintaining appropriate intake of nutrient and fiber, sodium and fats, and appropriate energy expenditure required for the weight goal.;Weight Management: Provide education and appropriate resources to help participant work on and attain dietary goals.;Weight Management/Obesity: Establish reasonable short term and long term weight goals.;Obesity: Provide education and appropriate resources to help participant work on and attain dietary goals.    Admit Weight  221 lb 4.8 oz (100.4 kg)    Goal Weight: Short Term  215 lb (97.5 kg)    Goal Weight: Long Term  200 lb (90.7 kg)    Expected Outcomes  Short Term: Continue to assess and modify interventions  until short term weight is achieved;Long Term: Adherence to nutrition and physical activity/exercise program aimed toward attainment of established weight goal;Weight Maintenance: Understanding of the daily nutrition guidelines, which includes 25-35% calories from fat, 7% or less cal from saturated fats, less than 200mg  cholesterol, less than 1.5gm of sodium, & 5 or more servings of fruits and vegetables daily;Weight Loss: Understanding of general recommendations for a balanced deficit meal plan, which promotes 1-2 lb weight loss per week and includes a negative energy balance of [phone] kcal/d    Diabetes  Yes    Intervention  Provide education about signs/symptoms and action to take for hypo/hyperglycemia.;Provide education about proper nutrition, including hydration, and aerobic/resistive exercise prescription along with prescribed medications to achieve  blood glucose in normal ranges: Fasting glucose 65-99 mg/dL    Expected Outcomes  Short Term: Participant verbalizes understanding of the signs/symptoms and immediate care of hyper/hypoglycemia, proper foot care and importance of medication, aerobic/resistive exercise and nutrition plan for blood glucose control.;Long Term: Attainment of HbA1C < 7%.    Intervention  Provide a combined exercise and nutrition program that is supplemented with education, support and counseling about heart failure. Directed toward relieving symptoms such as shortness of breath, decreased exercise tolerance, and extremity edema.    Expected Outcomes  Improve functional capacity of life;Short term: Attendance in program 2-3 days a week with increased exercise capacity. Reported lower sodium intake. Reported increased fruit and vegetable intake. Reports medication compliance.;Short term: Daily weights obtained and reported for increase. Utilizing diuretic protocols set by physician.;Long term: Adoption of self-care skills and reduction of barriers for early signs and symptoms recognition and intervention leading to self-care maintenance.    Intervention  Provide education on lifestyle modifcations including regular physical activity/exercise, weight management, moderate sodium restriction and increased consumption of fresh fruit, vegetables, and low fat dairy, alcohol moderation, and smoking cessation.;Monitor prescription use compliance.    Expected Outcomes  Short Term: Continued assessment and intervention until BP is < 140/61mm HG in hypertensive participants. < 130/23mm HG in hypertensive participants with diabetes, heart failure or chronic kidney disease.;Long Term: Maintenance of blood pressure at goal levels.       Tobacco Use Initial Evaluation: Social History   Tobacco Use  Smoking Status Former Smoker  . Packs/day: 0.50  . Years: 1.00  . Pack years: 0.50  . Types: Cigarettes  . Quit date: 05/01/1969  . Years  since quitting: 50.3  Smokeless Tobacco Never Used  Tobacco Comment   Quit 1971    Exercise Goals and Review: Exercise Goals    Row Name 09/08/19 1300             Exercise Goals   Increase Physical Activity  Yes       Intervention  Provide advice, education, support and counseling about physical activity/exercise needs.;Develop an individualized exercise prescription for aerobic and resistive training based on initial evaluation findings, risk stratification, comorbidities and participant's personal goals.       Expected Outcomes  Short Term: Attend rehab on a regular basis to increase amount of physical activity.;Long Term: Add in home exercise to make exercise part of routine and to increase amount of physical activity.;Long Term: Exercising regularly at least 3-5 days a week.       Increase Strength and Stamina  Yes       Intervention  Provide advice, education, support and counseling about physical activity/exercise needs.;Develop an individualized exercise prescription for aerobic and resistive training based on initial evaluation findings, risk stratification,  comorbidities and participant's personal goals.       Expected Outcomes  Short Term: Increase workloads from initial exercise prescription for resistance, speed, and METs.;Short Term: Perform resistance training exercises routinely during rehab and add in resistance training at home;Long Term: Improve cardiorespiratory fitness, muscular endurance and strength as measured by increased METs and functional capacity ( )       Able to understand and use rate of perceived exertion (RPE) scale  Yes       Intervention  Provide education and explanation on how to use RPE scale       Expected Outcomes  Short Term: Able to use RPE daily in rehab to express subjective intensity level;Long Term:  Able to use RPE to guide intensity level when exercising independently       Able to understand and use Dyspnea scale  Yes       Intervention   Provide education and explanation on how to use Dyspnea scale       Expected Outcomes  Short Term: Able to use Dyspnea scale daily in rehab to express subjective sense of shortness of breath during exertion;Long Term: Able to use Dyspnea scale to guide intensity level when exercising independently       Knowledge and understanding of Target Heart Rate Range (THRR)  Yes       Intervention  Provide education and explanation of THRR including how the numbers were predicted and where they are located for reference       Expected Outcomes  Short Term: Able to state/look up THRR;Short Term: Able to use daily as guideline for intensity in rehab;Long Term: Able to use THRR to govern intensity when exercising independently       Able to check pulse independently  Yes       Intervention  Provide education and demonstration on how to check pulse in carotid and radial arteries.;Review the importance of being able to check your own pulse for safety during independent exercise       Expected Outcomes  Short Term: Able to explain why pulse checking is important during independent exercise;Long Term: Able to check pulse independently and accurately       Understanding of Exercise Prescription  Yes       Intervention  Provide education, explanation, and written materials on patient's individual exercise prescription       Expected Outcomes  Short Term: Able to explain program exercise prescription;Long Term: Able to explain home exercise prescription to exercise independently          Copy of goals given to participant.

## 2019-09-08 NOTE — Progress Notes (Signed)
Pulmonary Individual Treatment Plan  Patient Details  Name: Mario [name] MRN: [id number] Date of Birth: 1945/07/11 Referring Provider:     Pulmonary Rehab from 09/08/2019 in [facility] Cardiac and Pulmonary Rehab  Referring Provider  [name]      Initial Encounter Date:    Pulmonary Rehab from 09/08/2019 in [facility] Cardiac and Pulmonary Rehab  Date  09/08/19      Visit Diagnosis: Chronic obstructive pulmonary disease, unspecified COPD type ([place])  Patient's Home Medications on Admission:  Current Outpatient Medications:  .  albuterol (PROVENTIL HFA;VENTOLIN HFA) 108 (90 Base) MCG/ACT inhaler, Inhale 2 puffs into the lungs as needed., Disp: , Rfl:  .  APPLE CIDER VINEGAR PO, Take by mouth daily., Disp: , Rfl:  .  celecoxib (CELEBREX) 200 MG capsule, , Disp: , Rfl:  .  diclofenac Sodium (VOLTAREN) 1 % GEL, Apply 4 g topically 4 (four) times daily., Disp: 100 g, Rfl: 2 .  DULoxetine (CYMBALTA) 60 MG capsule, am, Disp: , Rfl:  .  edoxaban (SAVAYSA) 60 MG TABS tablet, Take 60 mg by mouth daily. cardiology, Disp: , Rfl:  .  Fluticasone-Umeclidin-Vilant (TRELEGY ELLIPTA IN), Inhale into the lungs daily., Disp: , Rfl:  .  furosemide (LASIX) 80 MG tablet, Take 1 tablet by mouth 2 (two) times daily. 80 mg AM, 120 mg PM, Disp: , Rfl:  .  gabapentin (NEURONTIN) 300 MG capsule, Take 800 mg by mouth 3 (three) times daily. , Disp: , Rfl:  .  gabapentin (NEURONTIN) 800 MG tablet, Take 800 mg by mouth 3 (three) times daily., Disp: , Rfl:  .  glucose blood (ONE TOUCH ULTRA TEST) test strip, Use as instructed FOR TESTING three times daily.  E11.9, Disp: , Rfl:  .  ipratropium-albuterol (DUONEB) 0.5-2.5 (3) MG/3ML SOLN, Inhale into the lungs., Disp: , Rfl:  .  metFORMIN (GLUCOPHAGE) 1000 MG tablet, Take 1 tablet by mouth 2 (two) times daily., Disp: , Rfl:  .  metFORMIN (GLUCOPHAGE) 500 MG tablet, , Disp: , Rfl:  .  OZEMPIC, 1 MG/DOSE, 2 MG/1.5ML SOPN, , Disp: , Rfl:  .  ROCKLATAN 0.02-0.005 % SOLN, Apply 1 drop  to eye at bedtime., Disp: , Rfl:  .  sotalol (BETAPACE) 80 MG tablet, Take 1 tablet by mouth 2 (two) times daily., Disp: , Rfl:  .  SOTALOL AF 80 MG TABS, Take by mouth 2 (two) times daily. Breakfast and dinner, Disp: , Rfl:  .  traMADol (ULTRAM) 50 MG tablet, Take by mouth 3 (three) times daily., Disp: , Rfl:  .  TURMERIC PO, Take by mouth daily., Disp: , Rfl:   Past Medical History: Past Medical History:  Diagnosis Date  . Arthritis    hands, knees  . Atrial fibrillation ([place])   . CHF (congestive heart failure) ([place])   . COPD (chronic obstructive pulmonary disease) ([place])   . Diabetes mellitus without complication ([place])   . Diabetic neuropathy ([place])   . Heart murmur    in past, mild 1969  . Hypertension    resolved after weight loss and more activity  . Neuromuscular disorder (H[facility])    neuropathy  . Neuropathy   . Pulmonary embolism ([place])    approx 2014  . Shingles '99  . Sleep apnea    CPAP  . Wears dentures    full upper and lower    Tobacco Use: Social History   Tobacco Use  Smoking Status Former Smoker  . Packs/day: 0.50  . Years: 1.00  . Pack years: 0.50  .  Types: Cigarettes  . Quit date: 05/01/1969  . Years since quitting: 50.3  Smokeless Tobacco Never Used  Tobacco Comment   Quit 1971    Labs: Recent Review Flowsheet Data    Labs for ITP Cardiac and Pulmonary Rehab Latest Ref Rng & Units 02/15/2016   Hemoglobin A1c 4.8 - 5.6 % 6.8(H)       Pulmonary Assessment Scores: Pulmonary Assessment Scores    Row Name 09/08/19 1302         ADL UCSD   SOB Score total  28     Rest  0     Walk  1     Stairs  3     Bath  0     Dress  0     Shop  1       CAT Score   CAT Score  7       mMRC Score   mMRC Score  2        UCSD: Self-administered rating of dyspnea associated with activities of daily living (ADLs) 6-point scale (0 = "not at all" to 5 = "maximal or unable to do because of breathlessness")  Scoring Scores range from 0 to 120.  Minimally  important difference is 5 units  CAT: CAT can identify the health impairment of COPD patients and is better correlated with disease progression.  CAT has a scoring range of zero to 40. The CAT score is classified into four groups of low (less than 10), medium (10 - 20), high (21-30) and very high (31-40) based on the impact level of disease on health status. A CAT score over 10 suggests significant symptoms.  A worsening CAT score could be explained by an exacerbation, poor medication adherence, poor inhaler technique, or progression of COPD or comorbid conditions.  CAT MCID is 2 points  mMRC: mMRC (Modified Medical Research Council) Dyspnea Scale is used to assess the degree of baseline functional disability in patients of respiratory disease due to dyspnea. No minimal important difference is established. A decrease in score of 1 point or greater is considered a positive change.   Pulmonary Function Assessment:   Exercise Target Goals: Exercise Program Goal: Individual exercise prescription set using results from initial 6 min walk test and THRR while considering  patient's activity barriers and safety.   Exercise Prescription Goal: Initial exercise prescription builds to 30-45 minutes a day of aerobic activity, 2-3 days per week.  Home exercise guidelines will be given to patient during program as part of exercise prescription that the participant will acknowledge.  Education: Aerobic Exercise & Resistance Training: - Gives group verbal and written instruction on the various components of exercise. Focuses on aerobic and resistive training programs and the benefits of this training and how to safely progress through these programs..   Education: Exercise & Equipment Safety: - Individual verbal instruction and demonstration of equipment use and safety with use of the equipment.   Pulmonary Rehab from 09/08/2019 in [facility] Cardiac and Pulmonary Rehab  Date  09/08/19  Educator  AS    Instruction Review Code  1- Verbalizes Understanding      Education: Exercise Physiology & General Exercise Guidelines: - Group verbal and written instruction with models to review the exercise physiology of the cardiovascular system and associated critical values. Provides general exercise guidelines with specific guidelines to those with heart or lung disease.    Pulmonary Rehab from 08/15/2017 in [facility] Cardiac and Pulmonary Rehab  Date  08/08/17  Educator  [facility]  Instruction Review Code  1- Verbalizes Understanding      Education: Flexibility, Balance, Mind/Body Relaxation: Provides group verbal/written instruction on the benefits of flexibility and balance training, including mind/body exercise modes such as yoga, pilates and tai chi.  Demonstration and skill practice provided.   Pulmonary Rehab from 08/15/2017 in [facility] Cardiac and Pulmonary Rehab  Date  06/13/17  Educator  AS  Instruction Review Code  1- Verbalizes Understanding      Activity Barriers & Risk Stratification: Activity Barriers & Cardiac Risk Stratification - 09/05/19 1018      Activity Barriers & Cardiac Risk Stratification   Activity Barriers  Joint Problems;Back Problems   Knees and lower back. Knees has been receiving injections.      6 Minute Walk: 6 Minute Walk    Row Name 09/08/19 1242         6 Minute Walk   Distance  595 feet     Walk Time  4.1 minutes     # of Rest Breaks  1     MPH  1.65     METS  1.43     RPE  13     Perceived Dyspnea   1     VO2 Peak  5.01     Symptoms  No     Resting HR  90 bpm     Resting BP  114/74     Resting Oxygen Saturation   97 %     Exercise Oxygen Saturation  during 6 min walk  80 %     Max Ex. HR  114 bpm     Max Ex. BP  136/84     2 Minute Post BP  126/74       Interval HR   1 Minute HR  103     2 Minute HR  106     3 Minute HR  104     4 Minute HR  103     5 Minute HR  105     6 Minute HR  114     2 Minute Post HR  108     Interval Heart Rate?  Yes        Interval Oxygen   Interval Oxygen?  Yes     Baseline Oxygen Saturation %  97 %     1 Minute Oxygen Saturation %  84 %     1 Minute Liters of Oxygen  2 L     2 Minute Oxygen Saturation %  81 %     2 Minute Liters of Oxygen  2 L     3 Minute Oxygen Saturation %  80 %     3 Minute Liters of Oxygen  2 L     4 Minute Oxygen Saturation %  88 %     4 Minute Liters of Oxygen  2 L     5 Minute Oxygen Saturation %  87 %     5 Minute Liters of Oxygen  2 L     6 Minute Oxygen Saturation %  84 %     6 Minute Liters of Oxygen  2 L     2 Minute Post Oxygen Saturation %  89 %     2 Minute Post Liters of Oxygen  2 L       Oxygen Initial Assessment: Oxygen Initial Assessment - 09/05/19 1019      Home Oxygen   Home Oxygen Device  Home  Concentrator;Portable Concentrator    Sleep Oxygen Prescription  CPAP    Liters per minute  2    Home Exercise Oxygen Prescription  Continuous    Liters per minute  2    Liters per minute  2   as needed   Compliance with Home Oxygen Use  Yes      Intervention   Short Term Goals  To learn and exhibit compliance with exercise, home and travel O2 prescription;To learn and understand importance of monitoring SPO2 with pulse oximeter and demonstrate accurate use of the pulse oximeter.;To learn and understand importance of maintaining oxygen saturations>88%;To learn and demonstrate proper pursed lip breathing techniques or other breathing techniques.;To learn and demonstrate proper use of respiratory medications    Long  Term Goals  Exhibits compliance with exercise, home and travel O2 prescription;Verbalizes importance of monitoring SPO2 with pulse oximeter and return demonstration;Maintenance of O2 saturations>88%;Exhibits proper breathing techniques, such as pursed lip breathing or other method taught during program session;Compliance with respiratory medication;Demonstrates proper use of MDI's       Oxygen Re-Evaluation:   Oxygen Discharge (Final Oxygen  Re-Evaluation):   Initial Exercise Prescription: Initial Exercise Prescription - 09/08/19 1200      Date of Initial Exercise RX and Referring Provider   Date  09/08/19    Referring Provider  [name]      Treadmill   MPH  1.5    Grade  0    Minutes  15      Recumbant Bike   Level  1    RPM  60    Minutes  15    METs  1.5      NuStep   Level  1    SPM  80    Minutes  15    METs  1.5      REL-XR   Level  1    Speed  50    Minutes  15    METs  1.5      Prescription Details   Frequency (times per week)  3    Duration  Progress to 30 minutes of continuous aerobic without signs/symptoms of physical distress      Intensity   THRR 40-80% of Max Heartrate  113-136    Ratings of Perceived Exertion  11-15    Perceived Dyspnea  0-4      Resistance Training   Training Prescription  Yes    Weight  3 lb    Reps  10-15       Perform Capillary Blood Glucose checks as needed.  Exercise Prescription Changes: Exercise Prescription Changes    Row Name 09/08/19 1200             Response to Exercise   Blood Pressure (Admit)  114/74       Blood Pressure (Exercise)  136/84       Blood Pressure (Exit)  126/74       Heart Rate (Admit)  90 bpm       Heart Rate (Exercise)  114 bpm       Heart Rate (Exit)  108 bpm       Oxygen Saturation (Admit)  97 %       Oxygen Saturation (Exercise)  80 %       Oxygen Saturation (Exit)  89 %       Rating of Perceived Exertion (Exercise)  13       Perceived Dyspnea (Exercise)  1  Symptoms  none          Exercise Comments: Exercise Comments    Row Name 09/05/19 1049           Exercise Comments  His knees may inhibit his exercise function. He is getting injections in his knees and the injections do help his pain. He still has pain "under my kneecap" He is hoping this exercise will help with the knee pain. We did talk about PT for his knees. His physician was going to order PT.          Exercise Goals and Review: Exercise  Goals    Row Name 09/08/19 1300             Exercise Goals   Increase Physical Activity  Yes       Intervention  Provide advice, education, support and counseling about physical activity/exercise needs.;Develop an individualized exercise prescription for aerobic and resistive training based on initial evaluation findings, risk stratification, comorbidities and participant's personal goals.       Expected Outcomes  Short Term: Attend rehab on a regular basis to increase amount of physical activity.;Long Term: Add in home exercise to make exercise part of routine and to increase amount of physical activity.;Long Term: Exercising regularly at least 3-5 days a week.       Increase Strength and Stamina  Yes       Intervention  Provide advice, education, support and counseling about physical activity/exercise needs.;Develop an individualized exercise prescription for aerobic and resistive training based on initial evaluation findings, risk stratification, comorbidities and participant's personal goals.       Expected Outcomes  Short Term: Increase workloads from initial exercise prescription for resistance, speed, and METs.;Short Term: Perform resistance training exercises routinely during rehab and add in resistance training at home;Long Term: Improve cardiorespiratory fitness, muscular endurance and strength as measured by increased METs and functional capacity (6MWT)       Able to understand and use rate of perceived exertion (RPE) scale  Yes       Intervention  Provide education and explanation on how to use RPE scale       Expected Outcomes  Short Term: Able to use RPE daily in rehab to express subjective intensity level;Long Term:  Able to use RPE to guide intensity level when exercising independently       Able to understand and use Dyspnea scale  Yes       Intervention  Provide education and explanation on how to use Dyspnea scale       Expected Outcomes  Short Term: Able to use Dyspnea scale  daily in rehab to express subjective sense of shortness of breath during exertion;Long Term: Able to use Dyspnea scale to guide intensity level when exercising independently       Knowledge and understanding of Target Heart Rate Range (THRR)  Yes       Intervention  Provide education and explanation of THRR including how the numbers were predicted and where they are located for reference       Expected Outcomes  Short Term: Able to state/look up THRR;Short Term: Able to use daily as guideline for intensity in rehab;Long Term: Able to use THRR to govern intensity when exercising independently       Able to check pulse independently  Yes       Intervention  Provide education and demonstration on how to check pulse in carotid and radial arteries.;Review the importance of being able to  check your own pulse for safety during independent exercise       Expected Outcomes  Short Term: Able to explain why pulse checking is important during independent exercise;Long Term: Able to check pulse independently and accurately       Understanding of Exercise Prescription  Yes       Intervention  Provide education, explanation, and written materials on patient's individual exercise prescription       Expected Outcomes  Short Term: Able to explain program exercise prescription;Long Term: Able to explain home exercise prescription to exercise independently          Exercise Goals Re-Evaluation :   Discharge Exercise Prescription (Final Exercise Prescription Changes): Exercise Prescription Changes - 09/08/19 1200      Response to Exercise   Blood Pressure (Admit)  114/74    Blood Pressure (Exercise)  136/84    Blood Pressure (Exit)  126/74    Heart Rate (Admit)  90 bpm    Heart Rate (Exercise)  114 bpm    Heart Rate (Exit)  108 bpm    Oxygen Saturation (Admit)  97 %    Oxygen Saturation (Exercise)  80 %    Oxygen Saturation (Exit)  89 %    Rating of Perceived Exertion (Exercise)  13    Perceived Dyspnea  (Exercise)  1    Symptoms  none       Nutrition:  Target Goals: Understanding of nutrition guidelines, daily intake of sodium <1572m, cholesterol <2065m calories 30% from fat and 7% or less from saturated fats, daily to have 5 or more servings of fruits and vegetables.  Education: Controlling Sodium/Reading Food Labels -Group verbal and written material supporting the discussion of sodium use in heart healthy nutrition. Review and explanation with models, verbal and written materials for utilization of the food label.   [facility]  Date  07/30/17  Educator  CR  Instruction Review Code  1- VeUnited States Steel Corporationnderstanding      Education: General Nutrition Guidelines/Fats and Fiber: -Group instruction provided by verbal, written material, models and posters to present the general guidelines for heart healthy nutrition. Gives an explanation and review of dietary fats and fiber.   [facility]  Date  07/23/17  Educator  CR  Instruction Review Code  1- Verbalizes Understanding      Biometrics: Pre Biometrics - 09/08/19 1301      Pre Biometrics   Height  5' 9.5" (1.765 m)    Weight  221 lb 4.8 oz (100.4 kg)    BMI (Calculated)  32.22        Nutrition Therapy Plan and Nutrition Goals:   Nutrition Assessments: Nutrition Assessments - 09/08/19 1303      MEDFICTS Scores   Pre Score  40       MEDIFICTS Score Key:          ?70 Need to make dietary changes          40-70 Heart Healthy Diet         ? 40 Therapeutic Level Cholesterol Diet  Nutrition Goals Re-Evaluation:   Nutrition Goals Discharge (Final Nutrition Goals Re-Evaluation):   Psychosocial: Target Goals: Acknowledge presence or absence of significant depression and/or stress, maximize coping skills, provide positive support system. Participant is able to verbalize types and ability to use techniques and  skills needed for reducing stress and depression.   Education: Depression - Provides  group verbal and written instruction on the correlation between heart/lung disease and depressed mood, treatment options, and the stigmas associated with seeking treatment.   Education: Sleep Hygiene -Provides group verbal and written instruction about how sleep can affect your health.  Define sleep hygiene, discuss sleep cycles and impact of sleep habits. Review good sleep hygiene tips.    Pulmonary Rehab from 08/15/2017 in [facility] Cardiac and Pulmonary Rehab  Date  08/15/17  Educator  [facility]  Instruction Review Code  1- Verbalizes Understanding      Education: Stress and Anxiety: - Provides group verbal and written instruction about the health risks of elevated stress and causes of high stress.  Discuss the correlation between heart/lung disease and anxiety and treatment options. Review healthy ways to manage with stress and anxiety.   Pulmonary Rehab from 08/15/2017 in [facility] Cardiac and Pulmonary Rehab  Date  07/04/17  Educator  [facility]  Instruction Review Code  1- Verbalizes Understanding      Initial Review & Psychosocial Screening: Initial Psych Review & Screening - 09/05/19 1028      Initial Review   Current issues with  Current Stress Concerns    Source of Stress Concerns  Unable to perform yard/household activities;Unable to participate in former interests or hobbies;Chronic Illness    Comments  COPD is well controlled, was told his lung tests are worse.  his knees prevent him from participating with daily chores      [place]?  Yes   Girlfriend Tonette Bihari, DAra's sister in [place],  [place] in [place] and [place]  Girlfriend Dara, Dara's sister in [place],  [place] in [place] and [place]      Barriers   Psychosocial barriers to participate in program  There are no identifiable barriers or psychosocial needs.;The patient should benefit from training in stress management and  relaxation.      Screening Interventions   Interventions  Encouraged to exercise;To provide support and resources with identified psychosocial needs;Provide feedback about the scores to participant    Expected Outcomes  Short Term goal: Utilizing psychosocial counselor, staff and physician to assist with identification of specific Stressors or current issues interfering with healing process. Setting desired goal for each stressor or current issue identified.;Long Term Goal: Stressors or current issues are controlled or eliminated.;Short Term goal: Identification and review with participant of any Quality of Life or Depression concerns found by scoring the questionnaire.;Long Term goal: The participant improves quality of Life and PHQ9 Scores as seen by post scores and/or verbalization of changes       Quality of Life Scores:  Scores of 19 and below usually indicate a poorer quality of life in these areas.  A difference of  2-3 points is a clinically meaningful difference.  A difference of 2-3 points in the total score of the Quality of Life Index has been associated with significant improvement in overall quality of life, self-image, physical symptoms, and general health in studies assessing change in quality of life.  PHQ-9: Recent Review Flowsheet Data    Depression screen [facility] 2/9 09/08/2019 06/25/2019 08/15/2017 07/11/2017 06/18/2017   Decreased Interest 3  0 '1 1 2   ' Down, Depressed, Hopeless 2 0 0 1 0   PHQ - 2 Score 5 0 '1 2 2   ' Altered sleeping 3  - 0 0 0   Tired, decreased energy 3  - '2 3 3   ' Change in appetite 0 - 0 2 2  Feeling bad or failure about yourself  0 - 0 2 0   Trouble concentrating 1 - 0 1 1   Moving slowly or fidgety/restless 1 - 0 0 0   Suicidal thoughts 0 - 0 0 0   PHQ-9 Score 13 - '3 10 8   ' Difficult doing work/chores Somewhat difficult - Not difficult at all Somewhat difficult Somewhat difficult     Interpretation of Total Score  Total Score Depression Severity:  1-4  = Minimal depression, 5-9 = Mild depression, 10-14 = Moderate depression, 15-19 = Moderately severe depression, 20-27 = Severe depression   Psychosocial Evaluation and Intervention: Psychosocial Evaluation - 09/05/19 1041      Psychosocial Evaluation & Interventions   Interventions  Encouraged to exercise with the program and follow exercise prescription    Comments  [name] has no barriers to returning to the program after over a year. He stated that he has not been doing his usual exercise regimen due to the [place] PHE. He is glad to have the referral so he can get back to a routine of exercise . He lives with his girlfrient Tonette Bihari (calls her Rose) and 2 dogs. He has a great support system with Rose. Other support is Rose's sister in [place], who is a Marine scientist and his children that live in [place] and [place]. His knees may inhibit his exercise function. He is getting injections in his knees and the injections do help his pain. He still has pain "under my kneecap" He is hoping this exercise will help with the knee pain. We did talk about PT for his knees. His physician was going to order PT. [name] should do well with the program    Expected Outcomes  STG: [name] will benefit from consistent exercise to achieve his stated goals.  LTG [name] will after discharge be able to continue with his exercise to maintain his progress    Continue Psychosocial Services   Follow up required by staff       Psychosocial Re-Evaluation:   Psychosocial Discharge (Final Psychosocial Re-Evaluation):   Education: Education Goals: Education classes will be provided on a weekly basis, covering required topics. Participant will state understanding/return demonstration of topics presented.  Learning Barriers/Preferences: Learning Barriers/Preferences - 09/05/19 1034      Learning Barriers/Preferences   Learning Barriers  None    Learning Preferences  None       General Pulmonary Education Topics:  Infection Prevention: -  Provides verbal and written material to individual with discussion of infection control including proper hand washing and proper equipment cleaning during exercise session.   Pulmonary Rehab from 09/08/2019 in [facility] Cardiac and Pulmonary Rehab  Date  09/08/19  Educator  AS  Instruction Review Code  1- Verbalizes Understanding      Falls Prevention: - Provides verbal and written material to individual with discussion of falls prevention and safety.   Pulmonary Rehab from 09/08/2019 in [facility] Cardiac and Pulmonary Rehab  Date  09/08/19  Educator  AS  Instruction Review Code  1- Verbalizes Understanding      Chronic Lung Diseases: - Group verbal and written instruction to review updates, respiratory medications, advancements in procedures and treatments. Discuss use of supplemental oxygen including available portable oxygen systems, continuous and intermittent flow rates, concentrators, personal use and safety guidelines. Review proper use of inhaler and spacers. Provide informative websites for self-education.    Pulmonary Rehab from 08/15/2017 in [facility] Cardiac and Pulmonary Rehab  Date  07/25/17  Educator  [facility]  Instruction Review Code  1- Teaching laboratory technician: - Provide group verbal and written instruction for methods to conserve energy, plan and organize activities. Instruct on pacing techniques, use of adaptive equipment and posture/positioning to relieve shortness of breath.   Pulmonary Rehab from 08/15/2017 in [facility] Cardiac and Pulmonary Rehab  Date  06/27/17  Educator  [facility]  Instruction Review Code  1- Verbalizes Understanding      Triggers and Exacerbations: - Group verbal and written instruction to review types of environmental triggers and ways to prevent exacerbations. Discuss weather changes, air quality and the benefits of nasal washing. Review warning signs and symptoms to help prevent infections. Discuss techniques for effective airway clearance,  coughing, and vibrations.   Pulmonary Rehab from 05/03/2016 in [facility] Cardiac and Pulmonary Rehab  Date  04/26/16  Educator  [facility]  Instruction Review Code (retired)  2- meets goals/outcomes      AED/CPR: - Group verbal and written instruction with the use of models to demonstrate the basic use of the AED with the basic ABC's of resuscitation.   Pulmonary Rehab from 08/15/2017 in [facility] Cardiac and Pulmonary Rehab  Date  06/29/17  Educator  [facility]  Instruction Review Code  1- Actuary and Physiology of the Lungs: - Group verbal and written instruction with the use of models to provide basic lung anatomy and physiology related to function, structure and complications of lung disease.   Pulmonary Rehab from 08/15/2017 in [facility] Cardiac and Pulmonary Rehab  Date  07/11/17  Educator  [facility]  Instruction Review Code  1- Verbalizes Understanding      Anatomy & Physiology of the Heart: - Group verbal and written instruction and models provide basic cardiac anatomy and physiology, with the coronary electrical and arterial systems. Review of Valvular disease and Heart Failure   Cardiac Medications: - Group verbal and written instruction to review commonly prescribed medications for heart disease. Reviews the medication, class of the drug, and side effects.   Pulmonary Rehab from 08/15/2017 in [facility] Cardiac and Pulmonary Rehab  Date  06/15/17  Educator  [facility]  Instruction Review Code  1- Verbalizes Understanding      Other: -Provides group and verbal instruction on various topics (see comments)   Pulmonary Rehab from 08/15/2017 in [facility] Cardiac and Pulmonary Rehab  Date  05/23/17  Educator  [facility]  Instruction Review Code  1- Verbalizes Understanding [SLEEP]      Knowledge Questionnaire Score: Knowledge Questionnaire Score - 09/08/19 1303      Knowledge Questionnaire Score   Pre Score  16/18        Core Components/Risk Factors/Patient Goals at Admission: Personal Goals and  Risk Factors at Admission - 09/05/19 1036      Core Components/Risk Factors/Patient Goals on Admission    Weight Management  Weight Loss;Yes    Intervention  Weight Management: Develop a combined nutrition and exercise program designed to reach desired caloric intake, while maintaining appropriate intake of nutrient and fiber, sodium and fats, and appropriate energy expenditure required for the weight goal.;Weight Management: Provide education and appropriate resources to help participant work on and attain dietary goals.;Weight Management/Obesity: Establish reasonable short term and long term weight goals.;Obesity: Provide education and appropriate resources to help participant work on and attain dietary goals.    Admit Weight  219 lb (99.3 kg)    Goal Weight: Short Term  215 lb (97.5 kg)    Goal Weight: Long  Term  200 lb (90.7 kg)    Expected Outcomes  Short Term: Continue to assess and modify interventions until short term weight is achieved;Long Term: Adherence to nutrition and physical activity/exercise program aimed toward attainment of established weight goal;Weight Maintenance: Understanding of the daily nutrition guidelines, which includes 25-35% calories from fat, 7% or less cal from saturated fats, less than 22m cholesterol, less than 1.5gm of sodium, & 5 or more servings of fruits and vegetables daily;Weight Loss: Understanding of general recommendations for a balanced deficit meal plan, which promotes 1-2 lb weight loss per week and includes a negative energy balance of [phone] kcal/d    Diabetes  Yes    Intervention  Provide education about signs/symptoms and action to take for hypo/hyperglycemia.;Provide education about proper nutrition, including hydration, and aerobic/resistive exercise prescription along with prescribed medications to achieve blood glucose in normal ranges: Fasting glucose 65-99 mg/dL    Expected Outcomes  Short Term: Participant verbalizes understanding of the  signs/symptoms and immediate care of hyper/hypoglycemia, proper foot care and importance of medication, aerobic/resistive exercise and nutrition plan for blood glucose control.;Long Term: Attainment of HbA1C < 7%.    Heart Failure  Yes    Intervention  Provide a combined exercise and nutrition program that is supplemented with education, support and counseling about heart failure. Directed toward relieving symptoms such as shortness of breath, decreased exercise tolerance, and extremity edema.    Expected Outcomes  Improve functional capacity of life;Short term: Attendance in program 2-3 days a week with increased exercise capacity. Reported lower sodium intake. Reported increased fruit and vegetable intake. Reports medication compliance.;Short term: Daily weights obtained and reported for increase. Utilizing diuretic protocols set by physician.;Long term: Adoption of self-care skills and reduction of barriers for early signs and symptoms recognition and intervention leading to self-care maintenance.    Intervention  Provide education on lifestyle modifcations including regular physical activity/exercise, weight management, moderate sodium restriction and increased consumption of fresh fruit, vegetables, and low fat dairy, alcohol moderation, and smoking cessation.;Monitor prescription use compliance.    Expected Outcomes  Short Term: Continued assessment and intervention until BP is < 140/934mHG in hypertensive participants. < 130/8058mG in hypertensive participants with diabetes, heart failure or chronic kidney disease.;Long Term: Maintenance of blood pressure at goal levels.       Education:Diabetes - Individual verbal and written instruction to review signs/symptoms of diabetes, desired ranges of glucose level fasting, after meals and with exercise. Acknowledge that pre and post exercise glucose checks will be done for 3 sessions at entry of program.   [facility]  Date  04/04/16  Educator  [facility]  Instruction Review Code (retired)  2- meets goals/outcomes      Education: Know Your Numbers and Risk Factors: -Group verbal and written instruction about important numbers in your health.  Discussion of what are risk factors and how they play a role in the disease process.  Review of Cholesterol, Blood Pressure, Diabetes, and BMI and the role they play in your overall health.   [facility]  Date  08/10/17  [facility]  1- Verbalizes Understanding      Core Components/Risk Factors/Patient Goals Review:    Core Components/Risk Factors/Patient Goals at Discharge (Final Review):    ITP Comments: ITP Comments    Row Name 09/05/19 1004  ITP Comments  Virtual Orientation completed today. Exercise evaluation scheduled for Monday 5/10 with gym orientation. Documentation for diagnosis can be found in [facility] 4/21.          Comments: initial ITP

## 2019-09-10 ENCOUNTER — Encounter: Payer: Medicare Other | Admitting: *Deleted

## 2019-09-10 ENCOUNTER — Other Ambulatory Visit: Payer: Self-pay

## 2019-09-10 ENCOUNTER — Ambulatory Visit: Payer: Medicare Other

## 2019-09-10 DIAGNOSIS — J449 Chronic obstructive pulmonary disease, unspecified: Secondary | ICD-10-CM | POA: Diagnosis not present

## 2019-09-10 LAB — GLUCOSE, CAPILLARY
Glucose-Capillary: 122 mg/dL — ABNORMAL HIGH (ref 70–99)
Glucose-Capillary: 85 mg/dL (ref 70–99)

## 2019-09-10 NOTE — Progress Notes (Signed)
Daily Session Note  Patient Details  Name: Mario [name] MRN: [id number] Date of Birth: Jun 08, 1945 Referring Provider:     Pulmonary Rehab from 09/08/2019 in [facility] Cardiac and Pulmonary Rehab  Referring Provider  [name]      Encounter Date: 09/10/2019  Check In: Session Check In - 09/10/19 1128      Check-In   Supervising physician immediately available to respond to emergencies  See telemetry face sheet for immediately available ER MD    Location  [facility]-Cardiac & Pulmonary Rehab    Staff Present  Justin Mend RCP,RRT,BSRT;Tesha Archambeau Sherryll Burger, RN Vickki Hearing, BA, ACSM CEP, Exercise Physiologist    Virtual Visit  No    Medication changes reported      No    Fall or balance concerns reported     No    Warm-up and Cool-down  Performed on first and last piece of equipment    Resistance Training Performed  Yes    VAD Patient?  No    PAD/SET Patient?  No      Pain Assessment   Currently in Pain?  No/denies          Social History   Tobacco Use  Smoking Status Former Smoker  . Packs/day: 0.50  . Years: 1.00  . Pack years: 0.50  . Types: Cigarettes  . Quit date: 05/01/1969  . Years since quitting: 50.3  Smokeless Tobacco Never Used  Tobacco Comment   Quit 1971    Goals Met:  Independence with exercise equipment Exercise tolerated well No report of cardiac concerns or symptoms Strength training completed today  Goals Unmet:  Not Applicable  Comments: First full day of exercise!  Patient was oriented to gym and equipment including functions, settings, policies, and procedures.  Patient's individual exercise prescription and treatment plan were reviewed.  All starting workloads were established based on the results of the 6 minute walk test done at initial orientation visit.  The plan for exercise progression was also introduced and progression will be customized based on patient's performance and goals.    Dr. Emily Filbert is Medical Director for [place] and [name].

## 2019-09-12 ENCOUNTER — Encounter: Payer: Medicare Other | Admitting: *Deleted

## 2019-09-12 ENCOUNTER — Other Ambulatory Visit: Payer: Self-pay

## 2019-09-12 DIAGNOSIS — J449 Chronic obstructive pulmonary disease, unspecified: Secondary | ICD-10-CM | POA: Diagnosis not present

## 2019-09-12 LAB — GLUCOSE, CAPILLARY
Glucose-Capillary: 127 mg/dL — ABNORMAL HIGH (ref 70–99)
Glucose-Capillary: 72 mg/dL (ref 70–99)

## 2019-09-12 NOTE — Progress Notes (Signed)
Daily Session Note  Patient Details  Name: Malique Driskill MRN: [id number] Date of Birth: 1945-07-15 Referring Provider:     Pulmonary Rehab from 09/08/2019 in [facility] Cardiac and Pulmonary Rehab  Referring Provider  [name]      Encounter Date: 09/12/2019  Check In: Session Check In - 09/12/19 1132      Check-In   Supervising physician immediately available to respond to emergencies  See telemetry face sheet for immediately available ER MD    Staff Present  Basilia Jumbo, RN, BSN;Meredith Sherryll Burger, RN BSN;Joseph [place] [employer];Vida Rigger RN, BSN    Virtual Visit  No    Medication changes reported      No    Fall or balance concerns reported     No    Warm-up and Cool-down  Performed on first and last piece of equipment    Resistance Training Performed  Yes    VAD Patient?  No    PAD/SET Patient?  No      Pain Assessment   Currently in Pain?  No/denies          Social History   Tobacco Use  Smoking Status Former Smoker  . Packs/day: 0.50  . Years: 1.00  . Pack years: 0.50  . Types: Cigarettes  . Quit date: 05/01/1969  . Years since quitting: 50.4  Smokeless Tobacco Never Used  Tobacco Comment   Quit 1971    Goals Met:  Independence with exercise equipment Exercise tolerated well No report of cardiac concerns or symptoms  Goals Unmet:  Not Applicable  Comments: Pt able to follow exercise prescription today without complaint.  Will continue to monitor for progression.    Dr. Emily Filbert is Medical Director for [place] and [name].

## 2019-09-15 ENCOUNTER — Other Ambulatory Visit: Payer: Self-pay

## 2019-09-15 ENCOUNTER — Encounter: Payer: Medicare Other | Admitting: *Deleted

## 2019-09-15 DIAGNOSIS — J449 Chronic obstructive pulmonary disease, unspecified: Secondary | ICD-10-CM | POA: Diagnosis not present

## 2019-09-15 LAB — GLUCOSE, CAPILLARY
Glucose-Capillary: 106 mg/dL — ABNORMAL HIGH (ref 70–99)
Glucose-Capillary: 92 mg/dL (ref 70–99)

## 2019-09-15 NOTE — Progress Notes (Signed)
Daily Session Note  Patient Details  Name: Mario [name] MRN: [id number] Date of Birth: 12/02/1945 Referring Provider:     Pulmonary Rehab from 09/08/2019 in [facility] Cardiac and Pulmonary Rehab  Referring Provider  [name]      Encounter Date: 09/15/2019  Check In: Session Check In - 09/15/19 1128      Check-In   Supervising physician immediately available to respond to emergencies  See telemetry face sheet for immediately available ER MD    Location  [facility]-Cardiac & Pulmonary Rehab    Staff Present  Renita Papa, RN Vickki Hearing, BA, ACSM CEP, Exercise Physiologist;Kelly Amedeo Plenty, BS, ACSM CEP, Exercise Physiologist    Virtual Visit  No    Medication changes reported      No    Fall or balance concerns reported     No    Warm-up and Cool-down  Performed on first and last piece of equipment    Resistance Training Performed  Yes    VAD Patient?  No    PAD/SET Patient?  No      Pain Assessment   Currently in Pain?  No/denies          Social History   Tobacco Use  Smoking Status Former Smoker  . Packs/day: 0.50  . Years: 1.00  . Pack years: 0.50  . Types: Cigarettes  . Quit date: 05/01/1969  . Years since quitting: 50.4  Smokeless Tobacco Never Used  Tobacco Comment   Quit 1971    Goals Met:  Independence with exercise equipment Exercise tolerated well No report of cardiac concerns or symptoms Strength training completed today  Goals Unmet:  Not Applicable  Comments: Pt able to follow exercise prescription today without complaint.  Will continue to monitor for progression.    Dr. Emily Filbert is Medical Director for [place] and [name].

## 2019-09-17 ENCOUNTER — Encounter: Payer: Self-pay | Admitting: *Deleted

## 2019-09-17 ENCOUNTER — Encounter: Payer: Medicare Other | Admitting: *Deleted

## 2019-09-17 ENCOUNTER — Other Ambulatory Visit: Payer: Self-pay

## 2019-09-17 DIAGNOSIS — J449 Chronic obstructive pulmonary disease, unspecified: Secondary | ICD-10-CM

## 2019-09-17 NOTE — Progress Notes (Signed)
Daily Session Note  Patient Details  Name: Mario [name] MRN: [id number] Date of Birth: 12-Nov-1945 Referring Provider:     Pulmonary Rehab from 09/08/2019 in [facility] Cardiac and Pulmonary Rehab  Referring Provider  [name]      Encounter Date: 09/17/2019  Check In: Session Check In - 09/17/19 1110      Check-In   Supervising physician immediately available to respond to emergencies  See telemetry face sheet for immediately available ER MD    Location  [facility]-Cardiac & Pulmonary Rehab    Staff Present  Renita Papa, RN BSN;Joseph [address] [place], [place], [place], [facility], [place], [place], ACSM CEP, Exercise Physiologist    Virtual Visit  No    Medication changes reported      No    Fall or balance concerns reported     No    Warm-up and Cool-down  Performed on first and last piece of equipment    Resistance Training Performed  Yes    VAD Patient?  No    PAD/SET Patient?  No      Pain Assessment   Currently in Pain?  No/denies          Social History   Tobacco Use  Smoking Status Former Smoker  . Packs/day: 0.50  . Years: 1.00  . Pack years: 0.50  . Types: Cigarettes  . Quit date: 05/01/1969  . Years since quitting: 50.4  Smokeless Tobacco Never Used  Tobacco Comment   Quit 1971    Goals Met:  Independence with exercise equipment Exercise tolerated well No report of cardiac concerns or symptoms Strength training completed today  Goals Unmet:  Not Applicable  Comments: Pt able to follow exercise prescription today without complaint.  Will continue to monitor for progression.    Dr. Emily Filbert is Medical Director for [place] and [name].

## 2019-09-17 NOTE — Progress Notes (Signed)
Pulmonary Individual Treatment Plan  Patient Details  Name: Mario [name] MRN: [id number] Date of Birth: 02-Feb-1946 Referring Provider:     Pulmonary Rehab from 09/08/2019 in [facility] Cardiac and Pulmonary Rehab  Referring Provider  [name]      Initial Encounter Date:    Pulmonary Rehab from 09/08/2019 in [facility] Cardiac and Pulmonary Rehab  Date  09/08/19      Visit Diagnosis: Chronic obstructive pulmonary disease, unspecified COPD type ([place])  Patient's Home Medications on Admission:  Current Outpatient Medications:    albuterol (PROVENTIL HFA;VENTOLIN HFA) 108 (90 Base) MCG/ACT inhaler, Inhale 2 puffs into the lungs as needed., Disp: , Rfl:    APPLE CIDER VINEGAR PO, Take by mouth daily., Disp: , Rfl:    celecoxib (CELEBREX) 200 MG capsule, , Disp: , Rfl:    diclofenac Sodium (VOLTAREN) 1 % GEL, Apply 4 g topically 4 (four) times daily., Disp: 100 g, Rfl: 2   DULoxetine (CYMBALTA) 60 MG capsule, am, Disp: , Rfl:    edoxaban (SAVAYSA) 60 MG TABS tablet, Take 60 mg by mouth daily. cardiology, Disp: , Rfl:    Fluticasone-Umeclidin-Vilant (TRELEGY ELLIPTA IN), Inhale into the lungs daily., Disp: , Rfl:    furosemide (LASIX) 80 MG tablet, Take 1 tablet by mouth 2 (two) times daily. 80 mg AM, 120 mg PM, Disp: , Rfl:    gabapentin (NEURONTIN) 300 MG capsule, Take 800 mg by mouth 3 (three) times daily. , Disp: , Rfl:    gabapentin (NEURONTIN) 800 MG tablet, Take 800 mg by mouth 3 (three) times daily., Disp: , Rfl:    glucose blood (ONE TOUCH ULTRA TEST) test strip, Use as instructed FOR TESTING three times daily.  E11.9, Disp: , Rfl:    ipratropium-albuterol (DUONEB) 0.5-2.5 (3) MG/3ML SOLN, Inhale into the lungs., Disp: , Rfl:    metFORMIN (GLUCOPHAGE) 1000 MG tablet, Take 1 tablet by mouth 2 (two) times daily., Disp: , Rfl:    metFORMIN (GLUCOPHAGE) 500 MG tablet, , Disp: , Rfl:    OZEMPIC, 1 MG/DOSE, 2 MG/1.5ML SOPN, , Disp: , Rfl:    ROCKLATAN 0.02-0.005 % SOLN, Apply 1 drop  to eye at bedtime., Disp: , Rfl:    sotalol (BETAPACE) 80 MG tablet, Take 1 tablet by mouth 2 (two) times daily., Disp: , Rfl:    SOTALOL AF 80 MG TABS, Take by mouth 2 (two) times daily. Breakfast and dinner, Disp: , Rfl:    traMADol (ULTRAM) 50 MG tablet, Take by mouth 3 (three) times daily., Disp: , Rfl:    TURMERIC PO, Take by mouth daily., Disp: , Rfl:   Past Medical History: Past Medical History:  Diagnosis Date   Arthritis    hands, knees   Atrial fibrillation (HCC)    CHF (congestive heart failure) (HCC)    COPD (chronic obstructive pulmonary disease) (HCC)    Diabetes mellitus without complication (HCC)    Diabetic neuropathy (HCC)    Heart murmur    in past, mild 1969   Hypertension    resolved after weight loss and more activity   Neuromuscular disorder ([place])    neuropathy   Neuropathy    Pulmonary embolism ([place])    approx 2014   Shingles '99   Sleep apnea    CPAP   Wears dentures    full upper and lower    Tobacco Use: Social History   Tobacco Use  Smoking Status Former Smoker   Packs/day: 0.50   Years: 1.00   Pack years: 0.50  Types: Cigarettes   Quit date: 05/01/1969   Years since quitting: 50.4  Smokeless Tobacco Never Used  Tobacco Comment   Quit 1971    Labs: Recent Review Flowsheet Data    Labs for ITP Cardiac and Pulmonary Rehab Latest Ref Rng & Units 02/15/2016   Hemoglobin A1c 4.8 - 5.6 % 6.8(H)       Pulmonary Assessment Scores: Pulmonary Assessment Scores    Row Name 09/08/19 1302         ADL UCSD   SOB Score total  28     Rest  0     Walk  1     Stairs  3     Bath  0     Dress  0     Shop  1       CAT Score   CAT Score  7       mMRC Score   mMRC Score  2        UCSD: Self-administered rating of dyspnea associated with activities of daily living (ADLs) 6-point scale (0 = "not at all" to 5 = "maximal or unable to do because of breathlessness")  Scoring Scores range from 0 to 120.  Minimally  important difference is 5 units  CAT: CAT can identify the health impairment of COPD patients and is better correlated with disease progression.  CAT has a scoring range of zero to 40. The CAT score is classified into four groups of low (less than 10), medium (10 - 20), high (21-30) and very high (31-40) based on the impact level of disease on health status. A CAT score over 10 suggests significant symptoms.  A worsening CAT score could be explained by an exacerbation, poor medication adherence, poor inhaler technique, or progression of COPD or comorbid conditions.  CAT MCID is 2 points  mMRC: mMRC (Modified Medical Research Council) Dyspnea Scale is used to assess the degree of baseline functional disability in patients of respiratory disease due to dyspnea. No minimal important difference is established. A decrease in score of 1 point or greater is considered a positive change.   Pulmonary Function Assessment:   Exercise Target Goals: Exercise Program Goal: Individual exercise prescription set using results from initial 6 min walk test and THRR while considering  patients activity barriers and safety.   Exercise Prescription Goal: Initial exercise prescription builds to 30-45 minutes a day of aerobic activity, 2-3 days per week.  Home exercise guidelines will be given to patient during program as part of exercise prescription that the participant will acknowledge.  Education: Aerobic Exercise & Resistance Training: - Gives group verbal and written instruction on the various components of exercise. Focuses on aerobic and resistive training programs and the benefits of this training and how to safely progress through these programs..   Education: Exercise & Equipment Safety: - Individual verbal instruction and demonstration of equipment use and safety with use of the equipment.   Pulmonary Rehab from 09/08/2019 in [facility] Cardiac and Pulmonary Rehab  Date  09/08/19  Educator  AS    Instruction Review Code  1- Verbalizes Understanding      Education: Exercise Physiology & General Exercise Guidelines: - Group verbal and written instruction with models to review the exercise physiology of the cardiovascular system and associated critical values. Provides general exercise guidelines with specific guidelines to those with heart or lung disease.    Pulmonary Rehab from 08/15/2017 in [facility] Cardiac and Pulmonary Rehab  Date  08/08/17  Educator  [facility]  Instruction Review Code  1- Verbalizes Understanding      Education: Flexibility, Balance, Mind/Body Relaxation: Provides group verbal/written instruction on the benefits of flexibility and balance training, including mind/body exercise modes such as yoga, pilates and tai chi.  Demonstration and skill practice provided.   Pulmonary Rehab from 08/15/2017 in [facility] Cardiac and Pulmonary Rehab  Date  06/13/17  Educator  AS  Instruction Review Code  1- Verbalizes Understanding      Activity Barriers & Risk Stratification: Activity Barriers & Cardiac Risk Stratification - 09/05/19 1018      Activity Barriers & Cardiac Risk Stratification   Activity Barriers  Joint Problems;Back Problems   Knees and lower back. Knees has been receiving injections.      6 Minute Walk: 6 Minute Walk    Row Name 09/08/19 1242         6 Minute Walk   Distance  595 feet     Walk Time  4.1 minutes     # of Rest Breaks  1     MPH  1.65     METS  1.43     RPE  13     Perceived Dyspnea   1     VO2 Peak  5.01     Symptoms  No     Resting HR  90 bpm     Resting BP  114/74     Resting Oxygen Saturation   97 %     Exercise Oxygen Saturation  during 6 min walk  80 %     Max Ex. HR  114 bpm     Max Ex. BP  136/84     2 Minute Post BP  126/74       Interval HR   1 Minute HR  103     2 Minute HR  106     3 Minute HR  104     4 Minute HR  103     5 Minute HR  105     6 Minute HR  114     2 Minute Post HR  108     Interval Heart Rate?  Yes        Interval Oxygen   Interval Oxygen?  Yes     Baseline Oxygen Saturation %  97 %     1 Minute Oxygen Saturation %  84 %     1 Minute Liters of Oxygen  2 L     2 Minute Oxygen Saturation %  81 %     2 Minute Liters of Oxygen  2 L     3 Minute Oxygen Saturation %  80 %     3 Minute Liters of Oxygen  2 L     4 Minute Oxygen Saturation %  88 %     4 Minute Liters of Oxygen  2 L     5 Minute Oxygen Saturation %  87 %     5 Minute Liters of Oxygen  2 L     6 Minute Oxygen Saturation %  84 %     6 Minute Liters of Oxygen  2 L     2 Minute Post Oxygen Saturation %  89 %     2 Minute Post Liters of Oxygen  2 L       Oxygen Initial Assessment: Oxygen Initial Assessment - 09/05/19 1019      Home Oxygen   Home Oxygen Device  Home  Concentrator;Portable Concentrator    Sleep Oxygen Prescription  CPAP    Liters per minute  2    Home Exercise Oxygen Prescription  Continuous    Liters per minute  2    Liters per minute  2   as needed   Compliance with Home Oxygen Use  Yes      Intervention   Short Term Goals  To learn and exhibit compliance with exercise, home and travel O2 prescription;To learn and understand importance of monitoring SPO2 with pulse oximeter and demonstrate accurate use of the pulse oximeter.;To learn and understand importance of maintaining oxygen saturations>88%;To learn and demonstrate proper pursed lip breathing techniques or other breathing techniques.;To learn and demonstrate proper use of respiratory medications    Long  Term Goals  Exhibits compliance with exercise, home and travel O2 prescription;Verbalizes importance of monitoring SPO2 with pulse oximeter and return demonstration;Maintenance of O2 saturations>88%;Exhibits proper breathing techniques, such as pursed lip breathing or other method taught during program session;Compliance with respiratory medication;Demonstrates proper use of MDIs       Oxygen Re-Evaluation: Oxygen Re-Evaluation    Row Name  09/10/19 1130             Program Oxygen Prescription   Program Oxygen Prescription  Continuous;E-Tanks       Liters per minute  4         Home Oxygen   Home Oxygen Device  Home Concentrator;Portable Concentrator       Sleep Oxygen Prescription  CPAP       Liters per minute  2       Home Exercise Oxygen Prescription  Continuous       Liters per minute  2       Home at Rest Exercise Oxygen Prescription  Continuous       Liters per minute  2       Compliance with Home Oxygen Use  Yes         Goals/Expected Outcomes   Short Term Goals  To learn and exhibit compliance with exercise, home and travel O2 prescription;To learn and understand importance of monitoring SPO2 with pulse oximeter and demonstrate accurate use of the pulse oximeter.;To learn and understand importance of maintaining oxygen saturations>88%;To learn and demonstrate proper pursed lip breathing techniques or other breathing techniques.;To learn and demonstrate proper use of respiratory medications       Long  Term Goals  Exhibits compliance with exercise, home and travel O2 prescription;Verbalizes importance of monitoring SPO2 with pulse oximeter and return demonstration;Maintenance of O2 saturations>88%;Exhibits proper breathing techniques, such as pursed lip breathing or other method taught during program session;Compliance with respiratory medication;Demonstrates proper use of MDIs       Comments  Reviewed PLB technique with pt.  Talked about how it works and it's importance in maintaining their exercise saturations.       Goals/Expected Outcomes  Short: Become more profiecient at using PLB.   Long: Become independent at using PLB.          Oxygen Discharge (Final Oxygen Re-Evaluation): Oxygen Re-Evaluation - 09/10/19 1130      Program Oxygen Prescription   Program Oxygen Prescription  Continuous;E-Tanks    Liters per minute  4      Home Oxygen   Home Oxygen Device  Home Concentrator;Portable Concentrator     Sleep Oxygen Prescription  CPAP    Liters per minute  2    Home Exercise Oxygen Prescription  Continuous    Liters per  minute  2    Home at Rest Exercise Oxygen Prescription  Continuous    Liters per minute  2    Compliance with Home Oxygen Use  Yes      Goals/Expected Outcomes   Short Term Goals  To learn and exhibit compliance with exercise, home and travel O2 prescription;To learn and understand importance of monitoring SPO2 with pulse oximeter and demonstrate accurate use of the pulse oximeter.;To learn and understand importance of maintaining oxygen saturations>88%;To learn and demonstrate proper pursed lip breathing techniques or other breathing techniques.;To learn and demonstrate proper use of respiratory medications    Long  Term Goals  Exhibits compliance with exercise, home and travel O2 prescription;Verbalizes importance of monitoring SPO2 with pulse oximeter and return demonstration;Maintenance of O2 saturations>88%;Exhibits proper breathing techniques, such as pursed lip breathing or other method taught during program session;Compliance with respiratory medication;Demonstrates proper use of MDIs    Comments  Reviewed PLB technique with pt.  Talked about how it works and it's importance in maintaining their exercise saturations.    Goals/Expected Outcomes  Short: Become more profiecient at using PLB.   Long: Become independent at using PLB.       Initial Exercise Prescription: Initial Exercise Prescription - 09/08/19 1200      Date of Initial Exercise RX and Referring Provider   Date  09/08/19    Referring Provider  [name]      Treadmill   MPH  1.5    Grade  0    Minutes  15      Recumbant Bike   Level  1    RPM  60    Minutes  15    METs  1.5      NuStep   Level  1    SPM  80    Minutes  15    METs  1.5      REL-XR   Level  1    Speed  50    Minutes  15    METs  1.5      Prescription Details   Frequency (times per week)  3    Duration  Progress to 30  minutes of continuous aerobic without signs/symptoms of physical distress      Intensity   THRR 40-80% of Max Heartrate  113-136    Ratings of Perceived Exertion  11-15    Perceived Dyspnea  0-4      Resistance Training   Training Prescription  Yes    Weight  3 lb    Reps  10-15       Perform Capillary Blood Glucose checks as needed.  Exercise Prescription Changes: Exercise Prescription Changes    Row Name 09/08/19 1200 09/15/19 1400           Response to Exercise   Blood Pressure (Admit)  114/74  140/80      Blood Pressure (Exercise)  136/84  160/82      Blood Pressure (Exit)  126/74  150/85      Heart Rate (Admit)  90 bpm  89 bpm      Heart Rate (Exercise)  114 bpm  112 bpm      Heart Rate (Exit)  108 bpm  91 bpm      Oxygen Saturation (Admit)  97 %  91 %      Oxygen Saturation (Exercise)  80 %  90 %      Oxygen Saturation (Exit)  89 %  96 %  Rating of Perceived Exertion (Exercise)  13  11      Perceived Dyspnea (Exercise)  1  --      Symptoms  none  none      Duration  --  Continue with 30 min of aerobic exercise without signs/symptoms of physical distress.      Intensity  --  THRR unchanged        Progression   Progression  --  Continue to progress workloads to maintain intensity without signs/symptoms of physical distress.      Average METs  --  3.6        Resistance Training   Training Prescription  --  Yes      Weight  --  3 lb      Reps  --  10-15        Interval Training   Interval Training  --  No        NuStep   Level  --  2      Minutes  --  15      METs  --  3.1        REL-XR   Level  --  1      Minutes  --  15      METs  --  4.1         Exercise Comments: Exercise Comments    Row Name 09/05/19 1049           Exercise Comments  His knees may inhibit his exercise function. He is getting injections in his knees and the injections do help his pain. He still has pain "under my kneecap" He is hoping this exercise will help with the  knee pain. We did talk about PT for his knees. His physician was going to order PT.          Exercise Goals and Review: Exercise Goals    Row Name 09/08/19 1300             Exercise Goals   Increase Physical Activity  Yes       Intervention  Provide advice, education, support and counseling about physical activity/exercise needs.;Develop an individualized exercise prescription for aerobic and resistive training based on initial evaluation findings, risk stratification, comorbidities and participant's personal goals.       Expected Outcomes  Short Term: Attend rehab on a regular basis to increase amount of physical activity.;Long Term: Add in home exercise to make exercise part of routine and to increase amount of physical activity.;Long Term: Exercising regularly at least 3-5 days a week.       Increase Strength and Stamina  Yes       Intervention  Provide advice, education, support and counseling about physical activity/exercise needs.;Develop an individualized exercise prescription for aerobic and resistive training based on initial evaluation findings, risk stratification, comorbidities and participant's personal goals.       Expected Outcomes  Short Term: Increase workloads from initial exercise prescription for resistance, speed, and METs.;Short Term: Perform resistance training exercises routinely during rehab and add in resistance training at home;Long Term: Improve cardiorespiratory fitness, muscular endurance and strength as measured by increased METs and functional capacity (6MWT)       Able to understand and use rate of perceived exertion (RPE) scale  Yes       Intervention  Provide education and explanation on how to use RPE scale       Expected Outcomes  Short Term: Able to use  RPE daily in rehab to express subjective intensity level;Long Term:  Able to use RPE to guide intensity level when exercising independently       Able to understand and use Dyspnea scale  Yes        Intervention  Provide education and explanation on how to use Dyspnea scale       Expected Outcomes  Short Term: Able to use Dyspnea scale daily in rehab to express subjective sense of shortness of breath during exertion;Long Term: Able to use Dyspnea scale to guide intensity level when exercising independently       Knowledge and understanding of Target Heart Rate Range (THRR)  Yes       Intervention  Provide education and explanation of THRR including how the numbers were predicted and where they are located for reference       Expected Outcomes  Short Term: Able to state/look up THRR;Short Term: Able to use daily as guideline for intensity in rehab;Long Term: Able to use THRR to govern intensity when exercising independently       Able to check pulse independently  Yes       Intervention  Provide education and demonstration on how to check pulse in carotid and radial arteries.;Review the importance of being able to check your own pulse for safety during independent exercise       Expected Outcomes  Short Term: Able to explain why pulse checking is important during independent exercise;Long Term: Able to check pulse independently and accurately       Understanding of Exercise Prescription  Yes       Intervention  Provide education, explanation, and written materials on patient's individual exercise prescription       Expected Outcomes  Short Term: Able to explain program exercise prescription;Long Term: Able to explain home exercise prescription to exercise independently          Exercise Goals Re-Evaluation : Exercise Goals Re-Evaluation    [place] Name 09/10/19 1129 09/15/19 1352           Exercise Goal Re-Evaluation   Exercise Goals Review  Increase Physical Activity;Able to understand and use rate of perceived exertion (RPE) scale;Knowledge and understanding of Target Heart Rate Range (THRR);Understanding of Exercise Prescription;Increase Strength and Stamina;Able to check pulse  independently;Able to understand and use Dyspnea scale  Increase Physical Activity;Increase Strength and Stamina;Understanding of Exercise Prescription      Comments  Reviewed RPE and dyspnea scales, THR and program prescription with pt today.  Pt voiced understanding and was given a copy of goals to take home.  Nydia Bouton is off to a good start in rehab.   He has completed his first three full days of exercise and blood sugars have been good pre and post.  He is still hesistant on the treadmill, but we will continue to work with him through it.  We will continue to monitor his progress.      Expected Outcomes  Short: Use RPE daily to regulate intensity. Long: Follow program prescription in THR.  Short: Talk about walking to and from rehab.  Long: Continue to follow program prescription and adhere to good attendance         Discharge Exercise Prescription (Final Exercise Prescription Changes): Exercise Prescription Changes - 09/15/19 1400      Response to Exercise   Blood Pressure (Admit)  140/80    Blood Pressure (Exercise)  160/82    Blood Pressure (Exit)  150/85    Heart Rate (Admit)  89 bpm    Heart Rate (Exercise)  112 bpm    Heart Rate (Exit)  91 bpm    Oxygen Saturation (Admit)  91 %    Oxygen Saturation (Exercise)  90 %    Oxygen Saturation (Exit)  96 %    Rating of Perceived Exertion (Exercise)  11    Symptoms  none    Duration  Continue with 30 min of aerobic exercise without signs/symptoms of physical distress.    Intensity  THRR unchanged      Progression   Progression  Continue to progress workloads to maintain intensity without signs/symptoms of physical distress.    Average METs  3.6      Resistance Training   Training Prescription  Yes    Weight  3 lb    Reps  10-15      Interval Training   Interval Training  No      NuStep   Level  2    Minutes  15    METs  3.1      REL-XR   Level  1    Minutes  15    METs  4.1       Nutrition:  Target Goals: Understanding  of nutrition guidelines, daily intake of sodium <1537m, cholesterol <2050m calories 30% from fat and 7% or less from saturated fats, daily to have 5 or more servings of fruits and vegetables.  Education: Controlling Sodium/Reading Food Labels -Group verbal and written material supporting the discussion of sodium use in heart healthy nutrition. Review and explanation with models, verbal and written materials for utilization of the food label.   [facility]  Date  07/30/17  Educator  CR  Instruction Review Code  1- VeUnited States Steel Corporationnderstanding      Education: General Nutrition Guidelines/Fats and Fiber: -Group instruction provided by verbal, written material, models and posters to present the general guidelines for heart healthy nutrition. Gives an explanation and review of dietary fats and fiber.   [facility]  Date  07/23/17  Educator  CR  Instruction Review Code  1- Verbalizes Understanding      Biometrics: Pre Biometrics - 09/08/19 1301      Pre Biometrics   Height  5' 9.5" (1.765 m)    Weight  221 lb 4.8 oz (100.4 kg)    BMI (Calculated)  32.22        Nutrition Therapy Plan and Nutrition Goals:   Nutrition Assessments: Nutrition Assessments - 09/08/19 1303      MEDFICTS Scores   Pre Score  40       MEDIFICTS Score Key:          ?70 Need to make dietary changes          40-70 Heart Healthy Diet         ? 40 Therapeutic Level Cholesterol Diet  Nutrition Goals Re-Evaluation:   Nutrition Goals Discharge (Final Nutrition Goals Re-Evaluation):   Psychosocial: Target Goals: Acknowledge presence or absence of significant depression and/or stress, maximize coping skills, provide positive support system. Participant is able to verbalize types and ability to use techniques and skills needed for reducing stress and depression.   Education: Depression - Provides  group verbal and written instruction on the correlation between heart/lung disease and depressed mood, treatment options, and the stigmas associated with seeking treatment.   Education: Sleep Hygiene -Provides group verbal and written  instruction about how sleep can affect your health.  Define sleep hygiene, discuss sleep cycles and impact of sleep habits. Review good sleep hygiene tips.    Pulmonary Rehab from 08/15/2017 in [facility] Cardiac and Pulmonary Rehab  Date  08/15/17  Educator  [facility]  Instruction Review Code  1- Verbalizes Understanding      Education: Stress and Anxiety: - Provides group verbal and written instruction about the health risks of elevated stress and causes of high stress.  Discuss the correlation between heart/lung disease and anxiety and treatment options. Review healthy ways to manage with stress and anxiety.   Pulmonary Rehab from 08/15/2017 in [facility] Cardiac and Pulmonary Rehab  Date  07/04/17  Educator  [facility]  Instruction Review Code  1- Verbalizes Understanding      Initial Review & Psychosocial Screening: Initial Psych Review & Screening - 09/05/19 1028      Initial Review   Current issues with  Current Stress Concerns    Source of Stress Concerns  Unable to perform yard/household activities;Unable to participate in former interests or hobbies;Chronic Illness    Comments  COPD is well controlled, was told his lung tests are worse.  his knees prevent him from participating with daily chores      [place]?  Yes   Girlfriend Tonette Bihari, DAra's sister in [place],  [place] in [place] and [place]  Girlfriend Dara, Dara's sister in [place],  [place] in [place] and [place]      Barriers   Psychosocial barriers to participate in program  There are no identifiable barriers or psychosocial needs.;The patient should benefit from training in stress management and relaxation.      Screening Interventions   Interventions  Encouraged to exercise;To  provide support and resources with identified psychosocial needs;Provide feedback about the scores to participant    Expected Outcomes  Short Term goal: Utilizing psychosocial counselor, staff and physician to assist with identification of specific Stressors or current issues interfering with healing process. Setting desired goal for each stressor or current issue identified.;Long Term Goal: Stressors or current issues are controlled or eliminated.;Short Term goal: Identification and review with participant of any Quality of Life or Depression concerns found by scoring the questionnaire.;Long Term goal: The participant improves quality of Life and PHQ9 Scores as seen by post scores and/or verbalization of changes       Quality of Life Scores:  Scores of 19 and below usually indicate a poorer quality of life in these areas.  A difference of  2-3 points is a clinically meaningful difference.  A difference of 2-3 points in the total score of the Quality of Life Index has been associated with significant improvement in overall quality of life, self-image, physical symptoms, and general health in studies assessing change in quality of life.  PHQ-9: Recent Review Flowsheet Data    Depression screen [facility] 2/9 09/08/2019 06/25/2019 08/15/2017 07/11/2017 06/18/2017   Decreased Interest 3  0 _0 Down, Depressed, Hopeless 2 0 0 1 0   PHQ - 2 Score 5 0 _1 Altered sleeping 3  - 0 0 0   Tired, decreased energy 3  - _2 Change in appetite 0 - 0 2 2   Feeling bad or failure about yourself  0 - 0 2 0   Trouble concentrating 1 - 0 1 1   Moving slowly or fidgety/restless 1 - 0 0 0  Suicidal thoughts 0 - 0 0 0   PHQ-9 Score 13 - _0 Difficult doing work/chores Somewhat difficult - Not difficult at all Somewhat difficult Somewhat difficult     Interpretation of Total Score  Total Score Depression Severity:  1-4 = Minimal depression, 5-9 = Mild depression, 10-14 = Moderate depression, 15-19 =  Moderately severe depression, 20-27 = Severe depression   Psychosocial Evaluation and Intervention: Psychosocial Evaluation - 09/05/19 1041      Psychosocial Evaluation & Interventions   Interventions  Encouraged to exercise with the program and follow exercise prescription    Comments  [name] has no barriers to returning to the program after over a year. He stated that he has not been doing his usual exercise regimen due to the [place] [facility]. He is glad to have the referral so he can get back to a routine of exercise . He lives with his girlfrient Tonette Bihari (calls her Rose) and 2 dogs. He has a great support system with Rose. Other support is Rose's sister in [place], who is a Marine scientist and his children that live in [place] and [place]. His knees may inhibit his exercise function. He is getting injections in his knees and the injections do help his pain. He still has pain "under my kneecap" He is hoping this exercise will help with the knee pain. We did talk about PT for his knees. His physician was going to order PT. [name] should do well with the program    Expected Outcomes  STG: [name] will benefit from consistent exercise to achieve his stated goals.  LTG [name] will after discharge be able to continue with his exercise to maintain his progress    Continue Psychosocial Services   Follow up required by staff       Psychosocial Re-Evaluation:   Psychosocial Discharge (Final Psychosocial Re-Evaluation):   Education: Education Goals: Education classes will be provided on a weekly basis, covering required topics. Participant will state understanding/return demonstration of topics presented.  Learning Barriers/Preferences: Learning Barriers/Preferences - 09/05/19 1034      Learning Barriers/Preferences   Learning Barriers  None    Learning Preferences  None       General Pulmonary Education Topics:  Infection Prevention: - Provides verbal and written material to individual with discussion of infection control  including proper hand washing and proper equipment cleaning during exercise session.   Pulmonary Rehab from 09/08/2019 in [facility] Cardiac and Pulmonary Rehab  Date  09/08/19  Educator  AS  Instruction Review Code  1- Verbalizes Understanding      Falls Prevention: - Provides verbal and written material to individual with discussion of falls prevention and safety.   Pulmonary Rehab from 09/08/2019 in [facility] Cardiac and Pulmonary Rehab  Date  09/08/19  Educator  AS  Instruction Review Code  1- Verbalizes Understanding      Chronic Lung Diseases: - Group verbal and written instruction to review updates, respiratory medications, advancements in procedures and treatments. Discuss use of supplemental oxygen including available portable oxygen systems, continuous and intermittent flow rates, concentrators, personal use and safety guidelines. Review proper use of inhaler and spacers. Provide informative websites for self-education.    Pulmonary Rehab from 08/15/2017 in [facility] Cardiac and Pulmonary Rehab  Date  07/25/17  Educator  [facility]  Instruction Review Code  1- Verbalizes Understanding      Energy Conservation: - Provide group verbal and written instruction for methods to conserve energy, plan and organize activities. Instruct on pacing  techniques, use of adaptive equipment and posture/positioning to relieve shortness of breath.   Pulmonary Rehab from 08/15/2017 in [facility] Cardiac and Pulmonary Rehab  Date  06/27/17  Educator  [facility]  Instruction Review Code  1- Verbalizes Understanding      Triggers and Exacerbations: - Group verbal and written instruction to review types of environmental triggers and ways to prevent exacerbations. Discuss weather changes, air quality and the benefits of nasal washing. Review warning signs and symptoms to help prevent infections. Discuss techniques for effective airway clearance, coughing, and vibrations.   Pulmonary Rehab from 05/03/2016 in [facility] Cardiac and Pulmonary  Rehab  Date  04/26/16  Educator  LB  Instruction Review Code (retired)  2- meets goals/outcomes      AED/CPR: - Group verbal and written instruction with the use of models to demonstrate the basic use of the AED with the basic ABC's of resuscitation.   Pulmonary Rehab from 08/15/2017 in [facility] Cardiac and Pulmonary Rehab  Date  06/29/17  Educator  [facility]  Instruction Review Code  1- Actuary and Physiology of the Lungs: - Group verbal and written instruction with the use of models to provide basic lung anatomy and physiology related to function, structure and complications of lung disease.   Pulmonary Rehab from 08/15/2017 in [facility] Cardiac and Pulmonary Rehab  Date  07/11/17  Educator  [facility]  Instruction Review Code  1- Verbalizes Understanding      Anatomy & Physiology of the Heart: - Group verbal and written instruction and models provide basic cardiac anatomy and physiology, with the coronary electrical and arterial systems. Review of Valvular disease and Heart Failure   Cardiac Medications: - Group verbal and written instruction to review commonly prescribed medications for heart disease. Reviews the medication, class of the drug, and side effects.   Pulmonary Rehab from 08/15/2017 in [facility] Cardiac and Pulmonary Rehab  Date  06/15/17  Educator  [facility]  Instruction Review Code  1- Verbalizes Understanding      Other: -Provides group and verbal instruction on various topics (see comments)   Pulmonary Rehab from 08/15/2017 in [facility] Cardiac and Pulmonary Rehab  Date  05/23/17  Educator  [facility]  Instruction Review Code  1- Verbalizes Understanding [SLEEP]      Knowledge Questionnaire Score: Knowledge Questionnaire Score - 09/08/19 1303      Knowledge Questionnaire Score   Pre Score  16/18        Core Components/Risk Factors/Patient Goals at Admission: Personal Goals and Risk Factors at Admission - 09/08/19 1314      Core Components/Risk Factors/Patient  Goals on Admission    Weight Management  Weight Loss;Yes    Intervention  Weight Management: Develop a combined nutrition and exercise program designed to reach desired caloric intake, while maintaining appropriate intake of nutrient and fiber, sodium and fats, and appropriate energy expenditure required for the weight goal.;Weight Management: Provide education and appropriate resources to help participant work on and attain dietary goals.;Weight Management/Obesity: Establish reasonable short term and long term weight goals.;Obesity: Provide education and appropriate resources to help participant work on and attain dietary goals.    Admit Weight  221 lb 4.8 oz (100.4 kg)    Goal Weight: Short Term  215 lb (97.5 kg)    Goal Weight: Long Term  200 lb (90.7 kg)    Expected Outcomes  Short Term: Continue to assess and modify interventions until short term weight is achieved;Long Term: Adherence to nutrition and  physical activity/exercise program aimed toward attainment of established weight goal;Weight Maintenance: Understanding of the daily nutrition guidelines, which includes 25-35% calories from fat, 7% or less cal from saturated fats, less than 247m cholesterol, less than 1.5gm of sodium, & 5 or more servings of fruits and vegetables daily;Weight Loss: Understanding of general recommendations for a balanced deficit meal plan, which promotes 1-2 lb weight loss per week and includes a negative energy balance of [phone] kcal/d    Diabetes  Yes    Intervention  Provide education about signs/symptoms and action to take for hypo/hyperglycemia.;Provide education about proper nutrition, including hydration, and aerobic/resistive exercise prescription along with prescribed medications to achieve blood glucose in normal ranges: Fasting glucose 65-99 mg/dL    Expected Outcomes  Short Term: Participant verbalizes understanding of the signs/symptoms and immediate care of hyper/hypoglycemia, proper foot care and  importance of medication, aerobic/resistive exercise and nutrition plan for blood glucose control.;Long Term: Attainment of HbA1C < 7%.    Intervention  Provide a combined exercise and nutrition program that is supplemented with education, support and counseling about heart failure. Directed toward relieving symptoms such as shortness of breath, decreased exercise tolerance, and extremity edema.    Expected Outcomes  Improve functional capacity of life;Short term: Attendance in program 2-3 days a week with increased exercise capacity. Reported lower sodium intake. Reported increased fruit and vegetable intake. Reports medication compliance.;Short term: Daily weights obtained and reported for increase. Utilizing diuretic protocols set by physician.;Long term: Adoption of self-care skills and reduction of barriers for early signs and symptoms recognition and intervention leading to self-care maintenance.    Intervention  Provide education on lifestyle modifcations including regular physical activity/exercise, weight management, moderate sodium restriction and increased consumption of fresh fruit, vegetables, and low fat dairy, alcohol moderation, and smoking cessation.;Monitor prescription use compliance.    Expected Outcomes  Short Term: Continued assessment and intervention until BP is < 140/973mHG in hypertensive participants. < 130/8028mG in hypertensive participants with diabetes, heart failure or chronic kidney disease.;Long Term: Maintenance of blood pressure at goal levels.       Education:Diabetes - Individual verbal and written instruction to review signs/symptoms of diabetes, desired ranges of glucose level fasting, after meals and with exercise. Acknowledge that pre and post exercise glucose checks will be done for 3 sessions at entry of program.   [facility]  Date  04/04/16  Educator  LB  Instruction Review Code (retired)  2- meets  goals/outcomes      Education: Know Your Numbers and Risk Factors: -Group verbal and written instruction about important numbers in your health.  Discussion of what are risk factors and how they play a role in the disease process.  Review of Cholesterol, Blood Pressure, Diabetes, and BMI and the role they play in your overall health.   [facility]  Date  08/10/17  [facility]  1- Verbalizes Understanding      Core Components/Risk Factors/Patient Goals Review:    Core Components/Risk Factors/Patient Goals at Discharge (Final Review):    ITP Comments: ITP Comments    Row Name 09/05/19 1004 09/08/19 1312 09/10/19 1129 09/17/19 0533     ITP Comments  Virtual Orientation completed today. Exercise evaluation scheduled for Monday 5/10 with gym orientation. [facility]  Completed 6MWT and gym orientation.  Initial ITP created and sent  for review to Dr. Emily Filbert, Medical Director.  First full day of exercise!  Patient was oriented to gym and equipment including functions, settings, policies, and procedures.  Patient's individual exercise prescription and treatment plan were reviewed.  All starting workloads were established based on the results of the 6 minute walk test done at initial orientation visit.  The plan for exercise progression was also introduced and progression will be customized based on patient's performance and goals.  30 Day review completed. ITP review done, changes made as directed,and approval shown by signature of  Scientist, research (life sciences).  New to program       Comments:

## 2019-09-19 ENCOUNTER — Other Ambulatory Visit: Payer: Self-pay

## 2019-09-19 ENCOUNTER — Encounter: Payer: Medicare Other | Admitting: *Deleted

## 2019-09-19 DIAGNOSIS — J449 Chronic obstructive pulmonary disease, unspecified: Secondary | ICD-10-CM | POA: Diagnosis not present

## 2019-09-19 NOTE — Progress Notes (Signed)
Daily Session Note  Patient Details  Name: Constantino Starace MRN: [id number] Date of Birth: Feb 27, 1946 Referring Provider:     Pulmonary Rehab from 09/08/2019 in [facility] Cardiac and Pulmonary Rehab  Referring Provider  [name]      Encounter Date: 09/19/2019  Check In: Session Check In - 09/19/19 1110      Check-In   Supervising physician immediately available to respond to emergencies  See telemetry face sheet for immediately available ER MD    Location  [facility]-Cardiac & Pulmonary Rehab    Staff Present  Renita Papa, RN BSN;Joseph [address] [place], [place], [place], CCRP, CCET    Virtual Visit  No    Medication changes reported      No    Fall or balance concerns reported     No    Warm-up and Cool-down  Performed on first and last piece of equipment    Resistance Training Performed  Yes    VAD Patient?  No    PAD/SET Patient?  No      Pain Assessment   Currently in Pain?  No/denies          Social History   Tobacco Use  Smoking Status Former Smoker  . Packs/day: 0.50  . Years: 1.00  . Pack years: 0.50  . Types: Cigarettes  . Quit date: 05/01/1969  . Years since quitting: 50.4  Smokeless Tobacco Never Used  Tobacco Comment   Quit 1971    Goals Met:  Independence with exercise equipment Exercise tolerated well No report of cardiac concerns or symptoms Strength training completed today  Goals Unmet:  Not Applicable  Comments: Pt able to follow exercise prescription today without complaint.  Will continue to monitor for progression.    Dr. Emily Filbert is Medical Director for [place] and [name].

## 2019-09-22 ENCOUNTER — Ambulatory Visit: Payer: Medicare Other

## 2019-09-22 ENCOUNTER — Other Ambulatory Visit: Payer: Self-pay

## 2019-09-22 ENCOUNTER — Encounter: Payer: Medicare Other | Admitting: *Deleted

## 2019-09-22 DIAGNOSIS — J449 Chronic obstructive pulmonary disease, unspecified: Secondary | ICD-10-CM | POA: Diagnosis not present

## 2019-09-22 NOTE — Progress Notes (Signed)
Daily Session Note  Patient Details  Name: Alijah Akram MRN: [id number] Date of Birth: March 02, 1946 Referring Provider:     Pulmonary Rehab from 09/08/2019 in [facility] Cardiac and Pulmonary Rehab  Referring Provider  [name]      Encounter Date: 09/22/2019  Check In: Session Check In - 09/22/19 0942      Check-In   Supervising physician immediately available to respond to emergencies  See telemetry face sheet for immediately available ER MD    Location  [facility]-Cardiac & Pulmonary Rehab    Staff Present  Heath Lark, RN, BSN, Laveda Norman, BS, ACSM CEP, Exercise Physiologist;Joseph Tessie Fass RCP,RRT,BSRT    Virtual Visit  No    Medication changes reported      No    Fall or balance concerns reported     No    Warm-up and Cool-down  Performed on first and last piece of equipment    Resistance Training Performed  Yes    VAD Patient?  No    PAD/SET Patient?  No      Pain Assessment   Currently in Pain?  No/denies          Social History   Tobacco Use  Smoking Status Former Smoker  . Packs/day: 0.50  . Years: 1.00  . Pack years: 0.50  . Types: Cigarettes  . Quit date: 05/01/1969  . Years since quitting: 50.4  Smokeless Tobacco Never Used  Tobacco Comment   Quit 1971    Goals Met:  Proper associated with RPD/PD & O2 Sat Independence with exercise equipment Exercise tolerated well No report of cardiac concerns or symptoms  Goals Unmet:  Not Applicable  Comments: Pt able to follow exercise prescription today without complaint.  Will continue to monitor for progression.    Dr. Emily Filbert is Medical Director for [place] and [name].

## 2019-10-01 ENCOUNTER — Encounter: Payer: Medicare Other | Attending: Specialist | Admitting: *Deleted

## 2019-10-01 ENCOUNTER — Other Ambulatory Visit: Payer: Self-pay

## 2019-10-01 DIAGNOSIS — J449 Chronic obstructive pulmonary disease, unspecified: Secondary | ICD-10-CM | POA: Insufficient documentation

## 2019-10-01 DIAGNOSIS — G4730 Sleep apnea, unspecified: Secondary | ICD-10-CM | POA: Insufficient documentation

## 2019-10-01 NOTE — Progress Notes (Signed)
Daily Session Note  Patient Details  Name: Mario [name] MRN: [id number] Date of Birth: 12-Mar-1946 Referring Provider:     Pulmonary Rehab from 09/08/2019 in [facility] Cardiac and Pulmonary Rehab  Referring Provider  [name]      Encounter Date: 10/01/2019  Check In: Session Check In - 10/01/19 0926      Check-In   Supervising physician immediately available to respond to emergencies  See telemetry face sheet for immediately available ER MD    Location  [facility]-Cardiac & Pulmonary Rehab    Staff Present  Nyoka Cowden, RN, BSN, Willette Pa, MA, RCEP, CCRP, [place], [place], ACSM CEP, Exercise Physiologist    Virtual Visit  No    Medication changes reported      No    Fall or balance concerns reported     No    Tobacco Cessation  No Change    Warm-up and Cool-down  Performed on first and last piece of equipment    Resistance Training Performed  No    VAD Patient?  No    PAD/SET Patient?  No      Pain Assessment   Currently in Pain?  No/denies          Social History   Tobacco Use  Smoking Status Former Smoker  . Packs/day: 0.50  . Years: 1.00  . Pack years: 0.50  . Types: Cigarettes  . Quit date: 05/01/1969  . Years since quitting: 50.4  Smokeless Tobacco Never Used  Tobacco Comment   Quit 1971    Goals Met:  Independence with exercise equipment Exercise tolerated well No report of cardiac concerns or symptoms  Goals Unmet:  Not Applicable  Comments: Pt able to follow exercise prescription today without complaint.  Will continue to monitor for progression.  Updated home exercise with pt today.  Pt plans to walk and use bike at home for exercise.  Reviewed THR, pulse, RPE, sign and symptoms, pulse oximetery and when to call 911 or MD.  Also discussed weather considerations and indoor options.  Pt voiced understanding.  Dr. Emily Filbert is Medical Director for [place] and [name].

## 2019-10-03 ENCOUNTER — Other Ambulatory Visit: Payer: Self-pay

## 2019-10-03 ENCOUNTER — Encounter: Payer: Medicare Other | Admitting: *Deleted

## 2019-10-03 DIAGNOSIS — J449 Chronic obstructive pulmonary disease, unspecified: Secondary | ICD-10-CM

## 2019-10-03 NOTE — Progress Notes (Signed)
Daily Session Note  Patient Details  Name: Mario [name] MRN: [id number] Date of Birth: [birth year] Referring Provider:     Pulmonary Rehab from 09/08/2019 in [facility] Cardiac and Pulmonary Rehab  Referring Provider  [name]      Encounter Date: 10/03/2019  Check In:      Social History   Tobacco Use  Smoking Status Former Smoker  . Packs/day: 0.50  . Years: 1.00  . Pack years: 0.50  . Types: Cigarettes  . Quit date: 05/01/1969  . Years since quitting: 50.4  Smokeless Tobacco Never Used  Tobacco Comment   Quit 1971    Goals Met:  Independence with exercise equipment Exercise tolerated well No report of cardiac concerns or symptoms  Goals Unmet:  Not Applicable  Comments: Pt able to follow exercise prescription today without complaint.  Will continue to monitor for progression.    Dr. Emily Filbert is Medical Director for [place] and [name].

## 2019-10-08 ENCOUNTER — Encounter: Payer: Medicare Other | Admitting: *Deleted

## 2019-10-08 ENCOUNTER — Other Ambulatory Visit: Payer: Self-pay

## 2019-10-08 DIAGNOSIS — J449 Chronic obstructive pulmonary disease, unspecified: Secondary | ICD-10-CM

## 2019-10-08 NOTE — Progress Notes (Signed)
Daily Session Note  Patient Details  Name: Mario [name] MRN: [id number] Date of Birth: Mar 25, 1946 Referring Provider:     Pulmonary Rehab from 09/08/2019 in [facility] Cardiac and Pulmonary Rehab  Referring Provider  [name]      Encounter Date: 10/08/2019  Check In: Session Check In - 10/08/19 0947      Check-In   Supervising physician immediately available to respond to emergencies  See telemetry face sheet for immediately available ER MD    Location  [facility]-Cardiac & Pulmonary Rehab    Staff Present  Hope Budds RDN, LDN;Joseph Hood RCP,RRT,BSRT;Heath Lark, RN, BSN, CCRP    Virtual Visit  No    Medication changes reported      No    Fall or balance concerns reported     No    Warm-up and Cool-down  Performed on first and last piece of equipment    Resistance Training Performed  Yes    VAD Patient?  No    PAD/SET Patient?  No      Pain Assessment   Currently in Pain?  No/denies          Social History   Tobacco Use  Smoking Status Former Smoker  . Packs/day: 0.50  . Years: 1.00  . Pack years: 0.50  . Types: Cigarettes  . Quit date: 05/01/1969  . Years since quitting: 50.4  Smokeless Tobacco Never Used  Tobacco Comment   Quit 1971    Goals Met:  Proper associated with RPD/PD & O2 Sat Independence with exercise equipment Exercise tolerated well No report of cardiac concerns or symptoms  Goals Unmet:  Not Applicable  Comments: Pt able to follow exercise prescription today without complaint.  Will continue to monitor for progression.    Dr. Emily Filbert is Medical Director for [place] and [name].

## 2019-10-10 ENCOUNTER — Other Ambulatory Visit: Payer: Self-pay

## 2019-10-10 ENCOUNTER — Encounter: Payer: Medicare Other | Admitting: *Deleted

## 2019-10-10 DIAGNOSIS — J449 Chronic obstructive pulmonary disease, unspecified: Secondary | ICD-10-CM | POA: Diagnosis not present

## 2019-10-10 NOTE — Progress Notes (Signed)
Daily Session Note  Patient Details  Name: Mario [name] MRN: [id number] Date of Birth: 1945/05/31 Referring Provider:     Pulmonary Rehab from 09/08/2019 in [facility] Card[facility] Rehab  Referring Provider [name]      Encounter Date: 10/10/2019  Check In:  Session Check In - 10/10/19 0947      Check-In   Supervising physician immediately available to respond to emergencies See telemetry face sheet for immediately available ER MD    Location [facility]-Cardiac & Pulmonary Rehab    Staff Present Heath Lark, RN, BSN, CCRP;Joseph Hood RCP,RRT,BSRT;Jessica [place], [place], [place], [place], CCET    Virtual Visit No    Medication changes reported     No    Fall or balance concerns reported    No    Warm-up and Cool-down Performed on first and last piece of equipment    Resistance Training Performed Yes    VAD Patient? No    PAD/SET Patient? No      Pain Assessment   Currently in Pain? No/denies              Social History   Tobacco Use  Smoking Status Former Smoker  . Packs/day: 0.50  . Years: 1.00  . Pack years: 0.50  . Types: Cigarettes  . Quit date: 05/01/1969  . Years since quitting: 50.4  Smokeless Tobacco Never Used  Tobacco Comment   Quit 1971    Goals Met:  Proper associated with RPD/PD & O2 Sat Independence with exercise equipment Exercise tolerated well No report of cardiac concerns or symptoms  Goals Unmet:  Not Applicable  Comments: Pt able to follow exercise prescription today without complaint.  Will continue to monitor for progression.    Dr. Emily Filbert is Medical Director for [place] and [name].

## 2019-10-13 ENCOUNTER — Encounter: Payer: Medicare Other | Admitting: *Deleted

## 2019-10-13 ENCOUNTER — Other Ambulatory Visit: Payer: Self-pay

## 2019-10-13 DIAGNOSIS — J449 Chronic obstructive pulmonary disease, unspecified: Secondary | ICD-10-CM | POA: Diagnosis not present

## 2019-10-13 NOTE — Progress Notes (Signed)
Daily Session Note  Patient Details  Name: Hafiz Irion MRN: [id number] Date of Birth: April 23, 1946 Referring Provider:     Pulmonary Rehab from 09/08/2019 in [facility] Cardiac and Pulmonary Rehab  Referring Provider [name]      Encounter Date: 10/13/2019  Check In:  Session Check In - 10/13/19 0948      Check-In   Supervising physician immediately available to respond to emergencies See telemetry face sheet for immediately available ER MD    Location [facility] & Pulmonary Rehab    Staff Present Heath Lark, RN, BSN, CCRP;Joseph Hood RCP,RRT,BSRT;Kelly [place], [place], ACSM CEP, Exercise Physiologist    Virtual Visit No    Medication changes reported     No    Fall or balance concerns reported    No    Warm-up and Cool-down Performed on first and last piece of equipment    Resistance Training Performed Yes    VAD Patient? No    PAD/SET Patient? No      Pain Assessment   Currently in Pain? No/denies              Social History   Tobacco Use  Smoking Status Former Smoker  . Packs/day: 0.50  . Years: 1.00  . Pack years: 0.50  . Types: Cigarettes  . Quit date: 05/01/1969  . Years since quitting: 50.4  Smokeless Tobacco Never Used  Tobacco Comment   Quit 1971    Goals Met:  Proper associated with RPD/PD & O2 Sat Independence with exercise equipment Exercise tolerated well No report of cardiac concerns or symptoms  Goals Unmet:  Not Applicable  Comments: Pt able to follow exercise prescription today without complaint.  Will continue to monitor for progression.    Dr. Emily Filbert is Medical Director for [place] and [name].

## 2019-10-15 ENCOUNTER — Encounter: Payer: Self-pay | Admitting: *Deleted

## 2019-10-15 ENCOUNTER — Encounter: Payer: Medicare Other | Admitting: *Deleted

## 2019-10-15 ENCOUNTER — Other Ambulatory Visit: Payer: Self-pay

## 2019-10-15 DIAGNOSIS — J449 Chronic obstructive pulmonary disease, unspecified: Secondary | ICD-10-CM

## 2019-10-15 NOTE — Progress Notes (Signed)
Daily Session Note  Patient Details  Name: Juvenal Umar MRN: [id number] Date of Birth: 08-Feb-1946 Referring Provider:     Pulmonary Rehab from 09/08/2019 in [facility] Cardiac and Pulmonary Rehab  Referring Provider [name]      Encounter Date: 10/15/2019  Check In:  Session Check In - 10/15/19 0957      Check-In   Supervising physician immediately available to respond to emergencies See telemetry face sheet for immediately available ER MD    Location [facility]-Cardiac & Pulmonary Rehab    Staff Present Renita Papa, RN BSN;Joseph Hood RCP,RRT,BSRT;Amanda Oletta [place], [place], ACSM CEP, Exercise Physiologist    Virtual Visit No    Medication changes reported     No    Fall or balance concerns reported    No    Warm-up and Cool-down Performed on first and last piece of equipment    Resistance Training Performed Yes    VAD Patient? No    PAD/SET Patient? No      Pain Assessment   Currently in Pain? No/denies              Social History   Tobacco Use  Smoking Status Former Smoker  . Packs/day: 0.50  . Years: 1.00  . Pack years: 0.50  . Types: Cigarettes  . Quit date: 05/01/1969  . Years since quitting: 50.4  Smokeless Tobacco Never Used  Tobacco Comment   Quit 1971    Goals Met:  Independence with exercise equipment Exercise tolerated well No report of cardiac concerns or symptoms Strength training completed today  Goals Unmet:  Not Applicable  Comments: Pt able to follow exercise prescription today without complaint.  Will continue to monitor for progression.    Dr. Emily Filbert is Medical Director for [place] and [name].

## 2019-10-15 NOTE — Progress Notes (Signed)
Pulmonary Individual Treatment Plan  Patient Details  Name: Mario [name] MRN: [id number] Date of Birth: January 17, 1946 Referring Provider:     Pulmonary Rehab from 09/08/2019 in [facility] Cardiac and Pulmonary Rehab  Referring Provider [name]      Initial Encounter Date:    Pulmonary Rehab from 09/08/2019 in [facility] Cardiac and Pulmonary Rehab  Date 09/08/19      Visit Diagnosis: Chronic obstructive pulmonary disease, unspecified COPD type ([place])  Patient's Home Medications on Admission:  Current Outpatient Medications:  .  albuterol (PROVENTIL HFA;VENTOLIN HFA) 108 (90 Base) MCG/ACT inhaler, Inhale 2 puffs into the lungs as needed., Disp: , Rfl:  .  APPLE CIDER VINEGAR PO, Take by mouth daily., Disp: , Rfl:  .  celecoxib (CELEBREX) 200 MG capsule, , Disp: , Rfl:  .  diclofenac Sodium (VOLTAREN) 1 % GEL, Apply 4 g topically 4 (four) times daily., Disp: 100 g, Rfl: 2 .  DULoxetine (CYMBALTA) 60 MG capsule, am, Disp: , Rfl:  .  edoxaban (SAVAYSA) 60 MG TABS tablet, Take 60 mg by mouth daily. cardiology, Disp: , Rfl:  .  Fluticasone-Umeclidin-Vilant (TRELEGY ELLIPTA IN), Inhale into the lungs daily., Disp: , Rfl:  .  furosemide (LASIX) 80 MG tablet, Take 1 tablet by mouth 2 (two) times daily. 80 mg AM, 120 mg PM, Disp: , Rfl:  .  gabapentin (NEURONTIN) 300 MG capsule, Take 800 mg by mouth 3 (three) times daily. , Disp: , Rfl:  .  gabapentin (NEURONTIN) 800 MG tablet, Take 800 mg by mouth 3 (three) times daily., Disp: , Rfl:  .  glucose blood (ONE TOUCH ULTRA TEST) test strip, Use as instructed FOR TESTING three times daily.  E11.9, Disp: , Rfl:  .  ipratropium-albuterol (DUONEB) 0.5-2.5 (3) MG/3ML SOLN, Inhale into the lungs., Disp: , Rfl:  .  metFORMIN (GLUCOPHAGE) 1000 MG tablet, Take 1 tablet by mouth 2 (two) times daily., Disp: , Rfl:  .  metFORMIN (GLUCOPHAGE) 500 MG tablet, , Disp: , Rfl:  .  OZEMPIC, 1 MG/DOSE, 2 MG/1.5ML SOPN, , Disp: , Rfl:  .  ROCKLATAN 0.02-0.005 % SOLN, Apply 1 drop  to eye at bedtime., Disp: , Rfl:  .  sotalol (BETAPACE) 80 MG tablet, Take 1 tablet by mouth 2 (two) times daily., Disp: , Rfl:  .  SOTALOL AF 80 MG TABS, Take by mouth 2 (two) times daily. Breakfast and dinner, Disp: , Rfl:  .  traMADol (ULTRAM) 50 MG tablet, Take by mouth 3 (three) times daily., Disp: , Rfl:  .  TURMERIC PO, Take by mouth daily., Disp: , Rfl:   Past Medical History: Past Medical History:  Diagnosis Date  . Arthritis    hands, knees  . Atrial fibrillation ([place])   . CHF (congestive heart failure) ([place])   . COPD (chronic obstructive pulmonary disease) ([place])   . Diabetes mellitus without complication ([place])   . Diabetic neuropathy ([place])   . Heart murmur    in past, mild 1969  . Hypertension    resolved after weight loss and more activity  . Neuromuscular disorder ([facility])    neuropathy  . Neuropathy   . Pulmonary embolism ([place])    approx 2014  . Shingles '99  . Sleep apnea    CPAP  . Wears dentures    full upper and lower    Tobacco Use: Social History   Tobacco Use  Smoking Status Former Smoker  . Packs/day: 0.50  . Years: 1.00  . Pack years: 0.50  .  Types: Cigarettes  . Quit date: 05/01/1969  . Years since quitting: 50.4  Smokeless Tobacco Never Used  Tobacco Comment   Quit 1971    Labs: Recent Review Flowsheet Data    Labs for ITP Cardiac and Pulmonary Rehab Latest Ref Rng & Units 02/15/2016   Hemoglobin A1c 4.8 - 5.6 % 6.8(H)       Pulmonary Assessment Scores:  Pulmonary Assessment Scores    Row Name 09/08/19 1302         ADL UCSD   SOB Score total 28     Rest 0     Walk 1     Stairs 3     Bath 0     Dress 0     Shop 1       CAT Score   CAT Score 7       mMRC Score   mMRC Score 2            UCSD: Self-administered rating of dyspnea associated with activities of daily living (ADLs) 6-point scale (0 = "not at all" to 5 = "maximal or unable to do because of breathlessness")  Scoring Scores range from 0 to 120.  Minimally  important difference is 5 units  CAT: CAT can identify the health impairment of COPD patients and is better correlated with disease progression.  CAT has a scoring range of zero to 40. The CAT score is classified into four groups of low (less than 10), medium (10 - 20), high (21-30) and very high (31-40) based on the impact level of disease on health status. A CAT score over 10 suggests significant symptoms.  A worsening CAT score could be explained by an exacerbation, poor medication adherence, poor inhaler technique, or progression of COPD or comorbid conditions.  CAT MCID is 2 points  mMRC: mMRC (Modified Medical Research Council) Dyspnea Scale is used to assess the degree of baseline functional disability in patients of respiratory disease due to dyspnea. No minimal important difference is established. A decrease in score of 1 point or greater is considered a positive change.   Pulmonary Function Assessment:   Exercise Target Goals: Exercise Program Goal: Individual exercise prescription set using results from initial 6 min walk test and THRR while considering  patient's activity barriers and safety.   Exercise Prescription Goal: Initial exercise prescription builds to 30-45 minutes a day of aerobic activity, 2-3 days per week.  Home exercise guidelines will be given to patient during program as part of exercise prescription that the participant will acknowledge.  Education: Aerobic Exercise & Resistance Training: - Gives group verbal and written instruction on the various components of exercise. Focuses on aerobic and resistive training programs and the benefits of this training and how to safely progress through these programs..   Education: Exercise & Equipment Safety: - Individual verbal instruction and demonstration of equipment use and safety with use of the equipment.   Pulmonary Rehab from 09/08/2019 in [facility] Cardiac and Pulmonary Rehab  Date 09/08/19  Educator AS  Instruction  Review Code 1- Verbalizes Understanding      Education: Exercise Physiology & General Exercise Guidelines: - Group verbal and written instruction with models to review the exercise physiology of the cardiovascular system and associated critical values. Provides general exercise guidelines with specific guidelines to those with heart or lung disease.    Pulmonary Rehab from 08/15/2017 in [facility] Cardiac and Pulmonary Rehab  Date 08/08/17  Educator [facility]  Instruction Review Code 1- Verbalizes Understanding  Education: Flexibility, Balance, Mind/Body Relaxation: Provides group verbal/written instruction on the benefits of flexibility and balance training, including mind/body exercise modes such as yoga, pilates and tai chi.  Demonstration and skill practice provided.   Pulmonary Rehab from 08/15/2017 in [facility] Cardiac and Pulmonary Rehab  Date 06/13/17  Educator AS  Instruction Review Code 1- Verbalizes Understanding      Activity Barriers & Risk Stratification:  Activity Barriers & Cardiac Risk Stratification - 09/05/19 1018      Activity Barriers & Cardiac Risk Stratification   Activity Barriers Joint Problems;Back Problems   Knees and lower back. Knees has been receiving injections.          6 Minute Walk:  6 Minute Walk    Row Name 09/08/19 1242         6 Minute Walk   Distance 595 feet     Walk Time 4.1 minutes     # of Rest Breaks 1     MPH 1.65     METS 1.43     RPE 13     Perceived Dyspnea  1     VO2 Peak 5.01     Symptoms No     Resting HR 90 bpm     Resting BP 114/74     Resting Oxygen Saturation  97 %     Exercise Oxygen Saturation  during 6 min walk 80 %     Max Ex. HR 114 bpm     Max Ex. BP 136/84     2 Minute Post BP 126/74       Interval HR   1 Minute HR 103     2 Minute HR 106     3 Minute HR 104     4 Minute HR 103     5 Minute HR 105     6 Minute HR 114     2 Minute Post HR 108     Interval Heart Rate? Yes       Interval Oxygen   Interval  Oxygen? Yes     Baseline Oxygen Saturation % 97 %     1 Minute Oxygen Saturation % 84 %     1 Minute Liters of Oxygen 2 L     2 Minute Oxygen Saturation % 81 %     2 Minute Liters of Oxygen 2 L     3 Minute Oxygen Saturation % 80 %     3 Minute Liters of Oxygen 2 L     4 Minute Oxygen Saturation % 88 %     4 Minute Liters of Oxygen 2 L     5 Minute Oxygen Saturation % 87 %     5 Minute Liters of Oxygen 2 L     6 Minute Oxygen Saturation % 84 %     6 Minute Liters of Oxygen 2 L     2 Minute Post Oxygen Saturation % 89 %     2 Minute Post Liters of Oxygen 2 L           Oxygen Initial Assessment:  Oxygen Initial Assessment - 09/05/19 1019      Home Oxygen   Home Oxygen Device Home Concentrator;Portable Concentrator    Sleep Oxygen Prescription CPAP    Liters per minute 2    Home Exercise Oxygen Prescription Continuous    Liters per minute 2    Liters per minute 2   as needed   Compliance with Home Oxygen  Use Yes      Intervention   Short Term Goals To learn and exhibit compliance with exercise, home and travel O2 prescription;To learn and understand importance of monitoring SPO2 with pulse oximeter and demonstrate accurate use of the pulse oximeter.;To learn and understand importance of maintaining oxygen saturations>88%;To learn and demonstrate proper pursed lip breathing techniques or other breathing techniques.;To learn and demonstrate proper use of respiratory medications    Long  Term Goals Exhibits compliance with exercise, home and travel O2 prescription;Verbalizes importance of monitoring SPO2 with pulse oximeter and return demonstration;Maintenance of O2 saturations>88%;Exhibits proper breathing techniques, such as pursed lip breathing or other method taught during program session;Compliance with respiratory medication;Demonstrates proper use of MDI's           Oxygen Re-Evaluation:  Oxygen Re-Evaluation    Row Name 09/10/19 1130 10/01/19 0944           Program  Oxygen Prescription   Program Oxygen Prescription Continuous;E-Tanks Continuous;E-Tanks      Liters per minute 4 2        Home Oxygen   Home Oxygen Device Home Concentrator;Portable Concentrator Home Concentrator;Portable Concentrator      Sleep Oxygen Prescription CPAP CPAP      Liters per minute 2 2      Home Exercise Oxygen Prescription Continuous Continuous      Liters per minute 2 2      Home at Rest Exercise Oxygen Prescription Continuous Continuous      Liters per minute 2 2      Compliance with Home Oxygen Use Yes Yes        Goals/Expected Outcomes   Short Term Goals To learn and exhibit compliance with exercise, home and travel O2 prescription;To learn and understand importance of monitoring SPO2 with pulse oximeter and demonstrate accurate use of the pulse oximeter.;To learn and understand importance of maintaining oxygen saturations>88%;To learn and demonstrate proper pursed lip breathing techniques or other breathing techniques.;To learn and demonstrate proper use of respiratory medications To learn and exhibit compliance with exercise, home and travel O2 prescription;To learn and understand importance of monitoring SPO2 with pulse oximeter and demonstrate accurate use of the pulse oximeter.;To learn and understand importance of maintaining oxygen saturations>88%;To learn and demonstrate proper pursed lip breathing techniques or other breathing techniques.;To learn and demonstrate proper use of respiratory medications      Long  Term Goals Exhibits compliance with exercise, home and travel O2 prescription;Verbalizes importance of monitoring SPO2 with pulse oximeter and return demonstration;Maintenance of O2 saturations>88%;Exhibits proper breathing techniques, such as pursed lip breathing or other method taught during program session;Compliance with respiratory medication;Demonstrates proper use of MDI's Exhibits compliance with exercise, home and travel O2 prescription;Verbalizes  importance of monitoring SPO2 with pulse oximeter and return demonstration;Maintenance of O2 saturations>88%;Exhibits proper breathing techniques, such as pursed lip breathing or other method taught during program session;Compliance with respiratory medication;Demonstrates proper use of MDI's      Comments Reviewed PLB technique with pt.  Talked about how it works and it's importance in maintaining their exercise saturations. Mario [name] is doing well in rehab.  He has improved his compliance for wearing his CPAP.  He does not always use his oxygen at home when he is resting as his concentrator is upstairs.  When he is active, he will wear it.  He does use his pulse oximeter at home to keep and eye on his oxygen level.  He notices differences as too if he is hot or cold as  the hot air makes it harder to breathe.  He is not using the PLB as much at home, but trying to use it more and uses it most when trying to recover his breathing.      Goals/Expected Outcomes Short: Become more profiecient at using PLB.   Long: Become independent at using PLB. Short: Use PLB more  Long: Continued compliance.             Oxygen Discharge (Final Oxygen Re-Evaluation):  Oxygen Re-Evaluation - 10/01/19 0944      Program Oxygen Prescription   Program Oxygen Prescription Continuous;E-Tanks    Liters per minute 2      Home Oxygen   Home Oxygen Device Home Concentrator;Portable Concentrator    Sleep Oxygen Prescription CPAP    Liters per minute 2    Home Exercise Oxygen Prescription Continuous    Liters per minute 2    Home at Rest Exercise Oxygen Prescription Continuous    Liters per minute 2    Compliance with Home Oxygen Use Yes      Goals/Expected Outcomes   Short Term Goals To learn and exhibit compliance with exercise, home and travel O2 prescription;To learn and understand importance of monitoring SPO2 with pulse oximeter and demonstrate accurate use of the pulse oximeter.;To learn and understand importance of  maintaining oxygen saturations>88%;To learn and demonstrate proper pursed lip breathing techniques or other breathing techniques.;To learn and demonstrate proper use of respiratory medications    Long  Term Goals Exhibits compliance with exercise, home and travel O2 prescription;Verbalizes importance of monitoring SPO2 with pulse oximeter and return demonstration;Maintenance of O2 saturations>88%;Exhibits proper breathing techniques, such as pursed lip breathing or other method taught during program session;Compliance with respiratory medication;Demonstrates proper use of MDI's    Comments Mario [name] is doing well in rehab.  He has improved his compliance for wearing his CPAP.  He does not always use his oxygen at home when he is resting as his concentrator is upstairs.  When he is active, he will wear it.  He does use his pulse oximeter at home to keep and eye on his oxygen level.  He notices differences as too if he is hot or cold as the hot air makes it harder to breathe.  He is not using the PLB as much at home, but trying to use it more and uses it most when trying to recover his breathing.    Goals/Expected Outcomes Short: Use PLB more  Long: Continued compliance.           Initial Exercise Prescription:  Initial Exercise Prescription - 09/08/19 1200      Date of Initial Exercise RX and Referring Provider   Date 09/08/19    Referring Provider [name]      Treadmill   MPH 1.5    Grade 0    Minutes 15      Recumbant Bike   Level 1    RPM 60    Minutes 15    METs 1.5      NuStep   Level 1    SPM 80    Minutes 15    METs 1.5      REL-XR   Level 1    Speed 50    Minutes 15    METs 1.5      Prescription Details   Frequency (times per week) 3    Duration Progress to 30 minutes of continuous aerobic without signs/symptoms of physical distress  Intensity   THRR 40-80% of Max Heartrate 113-136    Ratings of Perceived Exertion 11-15    Perceived Dyspnea 0-4      Resistance  Training   Training Prescription Yes    Weight 3 lb    Reps 10-15           Perform Capillary Blood Glucose checks as needed.  Exercise Prescription Changes:  Exercise Prescription Changes    Row Name 09/08/19 1200 09/15/19 1400 09/30/19 1500         Response to Exercise   Blood Pressure (Admit) 114/74 140/80 120/80     Blood Pressure (Exercise) 136/84 160/82 142/60     Blood Pressure (Exit) 126/74 150/85 112/68     Heart Rate (Admit) 90 bpm 89 bpm 82 bpm     Heart Rate (Exercise) 114 bpm 112 bpm 97 bpm     Heart Rate (Exit) 108 bpm 91 bpm 97 bpm     Oxygen Saturation (Admit) 97 % 91 % 98 %     Oxygen Saturation (Exercise) 80 % 90 % 96 %     Oxygen Saturation (Exit) 89 % 96 % 99 %     Rating of Perceived Exertion (Exercise) _0 Perceived Dyspnea (Exercise) 1 -- 2     Symptoms none none none     Duration -- Continue with 30 min of aerobic exercise without signs/symptoms of physical distress. Continue with 30 min of aerobic exercise without signs/symptoms of physical distress.     Intensity -- THRR unchanged THRR unchanged       Progression   Progression -- Continue to progress workloads to maintain intensity without signs/symptoms of physical distress. Continue to progress workloads to maintain intensity without signs/symptoms of physical distress.     Average METs -- 3.6 2.7       Resistance Training   Training Prescription -- Yes Yes     Weight -- 3 lb 3 lb     Reps -- 10-15 10-15       Interval Training   Interval Training -- No No       Recumbant Bike   Level -- -- 1     RPM -- -- 60     Minutes -- -- 15     METs -- -- 2.1       NuStep   Level -- 2 4     SPM -- -- 80     Minutes -- 15 15     METs -- 3.1 3.3       REL-XR   Level -- 1 --     Minutes -- 15 --     METs -- 4.1 --            Exercise Comments:  Exercise Comments    Row Name 09/05/19 1049           Exercise Comments His knees may inhibit his exercise function. He is  getting injections in his knees and the injections do help his pain. He still has pain "under my kneecap" He is hoping this exercise will help with the knee pain. We did talk about PT for his knees. His physician was going to order PT.              Exercise Goals and Review:  Exercise Goals    Row Name 09/08/19 1300             Exercise Goals   Increase  Physical Activity Yes       Intervention Provide advice, education, support and counseling about physical activity/exercise needs.;Develop an individualized exercise prescription for aerobic and resistive training based on initial evaluation findings, risk stratification, comorbidities and participant's personal goals.       Expected Outcomes Short Term: Attend rehab on a regular basis to increase amount of physical activity.;Long Term: Add in home exercise to make exercise part of routine and to increase amount of physical activity.;Long Term: Exercising regularly at least 3-5 days a week.       Increase Strength and Stamina Yes       Intervention Provide advice, education, support and counseling about physical activity/exercise needs.;Develop an individualized exercise prescription for aerobic and resistive training based on initial evaluation findings, risk stratification, comorbidities and participant's personal goals.       Expected Outcomes Short Term: Increase workloads from initial exercise prescription for resistance, speed, and METs.;Short Term: Perform resistance training exercises routinely during rehab and add in resistance training at home;Long Term: Improve cardiorespiratory fitness, muscular endurance and strength as measured by increased METs and functional capacity (6MWT)       Able to understand and use rate of perceived exertion (RPE) scale Yes       Intervention Provide education and explanation on how to use RPE scale       Expected Outcomes Short Term: Able to use RPE daily in rehab to express subjective intensity  level;Long Term:  Able to use RPE to guide intensity level when exercising independently       Able to understand and use Dyspnea scale Yes       Intervention Provide education and explanation on how to use Dyspnea scale       Expected Outcomes Short Term: Able to use Dyspnea scale daily in rehab to express subjective sense of shortness of breath during exertion;Long Term: Able to use Dyspnea scale to guide intensity level when exercising independently       Knowledge and understanding of Target Heart Rate Range (THRR) Yes       Intervention Provide education and explanation of THRR including how the numbers were predicted and where they are located for reference       Expected Outcomes Short Term: Able to state/look up THRR;Short Term: Able to use daily as guideline for intensity in rehab;Long Term: Able to use THRR to govern intensity when exercising independently       Able to check pulse independently Yes       Intervention Provide education and demonstration on how to check pulse in carotid and radial arteries.;Review the importance of being able to check your own pulse for safety during independent exercise       Expected Outcomes Short Term: Able to explain why pulse checking is important during independent exercise;Long Term: Able to check pulse independently and accurately       Understanding of Exercise Prescription Yes       Intervention Provide education, explanation, and written materials on patient's individual exercise prescription       Expected Outcomes Short Term: Able to explain program exercise prescription;Long Term: Able to explain home exercise prescription to exercise independently              Exercise Goals Re-Evaluation :  Exercise Goals Re-Evaluation    Row Name 09/10/19 1129 09/15/19 1352 09/30/19 1525 10/01/19 0950       Exercise Goal Re-Evaluation   Exercise Goals Review Increase Physical Activity;Able to understand and  use rate of perceived exertion (RPE)  scale;Knowledge and understanding of Target Heart Rate Range (THRR);Understanding of Exercise Prescription;Increase Strength and Stamina;Able to check pulse independently;Able to understand and use Dyspnea scale Increase Physical Activity;Increase Strength and Stamina;Understanding of Exercise Prescription Increase Physical Activity;Increase Strength and Stamina;Understanding of Exercise Prescription Increase Physical Activity;Increase Strength and Stamina;Understanding of Exercise Prescription    Comments Reviewed RPE and dyspnea scales, THR and program prescription with pt today.  Pt voiced understanding and was given a copy of goals to take home. Mario [name] is off to a good start in rehab.   He has completed his first three full days of exercise and blood sugars have been good pre and post.  He is still hesistant on the treadmill, but we will continue to work with him through it.  We will continue to monitor his progress. Mario [name] has increased workloads on NS and XR.  Staff will continue to work with him on walking. Mario [name] is off to a good start in rehab.  He works hard in class and has started to use his recumbent bike at home while watching TV.  He will do it for 43mn at home.  We talked about adding in more time at home.  Updated home exercise with pt today.  Pt plans to walk and use bike at home for exercise.  Reviewed THR, pulse, RPE, sign and symptoms, pulse oximetery and when to call 911 or MD.  Also discussed weather considerations and indoor options.  Pt voiced understanding.    Expected Outcomes Short: Use RPE daily to regulate intensity. Long: Follow program prescription in THR. Short: Talk about walking to and from rehab.  Long: Continue to follow program prescription and adhere to good attendance Short: work on walking Long:  increase overall MET level and stamina Short: Start to increase exercise time at home to 30 min Long: Continue to improve stamina.           Discharge Exercise Prescription (Final  Exercise Prescription Changes):  Exercise Prescription Changes - 09/30/19 1500      Response to Exercise   Blood Pressure (Admit) 120/80    Blood Pressure (Exercise) 142/60    Blood Pressure (Exit) 112/68    Heart Rate (Admit) 82 bpm    Heart Rate (Exercise) 97 bpm    Heart Rate (Exit) 97 bpm    Oxygen Saturation (Admit) 98 %    Oxygen Saturation (Exercise) 96 %    Oxygen Saturation (Exit) 99 %    Rating of Perceived Exertion (Exercise) 15    Perceived Dyspnea (Exercise) 2    Symptoms none    Duration Continue with 30 min of aerobic exercise without signs/symptoms of physical distress.    Intensity THRR unchanged      Progression   Progression Continue to progress workloads to maintain intensity without signs/symptoms of physical distress.    Average METs 2.7      Resistance Training   Training Prescription Yes    Weight 3 lb    Reps 10-15      Interval Training   Interval Training No      Recumbant Bike   Level 1    RPM 60    Minutes 15    METs 2.1      NuStep   Level 4    SPM 80    Minutes 15    METs 3.3           Nutrition:  Target Goals: Understanding of nutrition guidelines, daily  intake of sodium <1517m, cholesterol <2061m calories 30% from fat and 7% or less from saturated fats, daily to have 5 or more servings of fruits and vegetables.  Education: Controlling Sodium/Reading Food Labels -Group verbal and written material supporting the discussion of sodium use in heart healthy nutrition. Review and explanation with models, verbal and written materials for utilization of the food label.   [facility]  Date 07/30/17  Educator CR  Instruction Review Code 1- VeUnited States Steel Corporationnderstanding      Education: General Nutrition Guidelines/Fats and Fiber: -Group instruction provided by verbal, written material, models and posters to present the general guidelines for heart healthy nutrition. Gives an  explanation and review of dietary fats and fiber.   [facility]  Date 07/23/17  Educator CR  Instruction Review Code 1- Verbalizes Understanding      Biometrics:  Pre Biometrics - 09/08/19 1301      Pre Biometrics   Height 5' 9.5" (1.765 m)    Weight 221 lb 4.8 oz (100.4 kg)    BMI (Calculated) 32.22            Nutrition Therapy Plan and Nutrition Goals:  Nutrition Therapy & Goals - 10/13/19 0953      Nutrition Therapy   Diet heart healthy, low Na, diabetic    Drug/Food Interactions Statins/Certain Fruits    Protein (specify units) 120g    Fiber 30 grams    Whole Grain Foods 3 servings    Saturated Fats 12 max. grams    Fruits and Vegetables 5 servings/day    Sodium 1.5 grams      Personal Nutrition Goals   Nutrition Goal ST: does not want to make short term goals at this time LT: wants to be able to walk without having assistance, now on 3L of O2 when exercising and 2L when not active; SOB affects activities of daily living  - needs to take breaks.    Comments 5.7 most recent A1C. B: Toast (potato bread - jelly and butter) and coffee - starbucks coffee (latte). S: bananas and other fruit D: spaghetti, chicken, sweet potatos, greens, and other vegetables; no salt, pt doesn't know what girlfriend cooks with S: chocolate pretzles, ice cream (gets one that has less sugar)  Drinks water all the time, during and in between meals. Lower appetite, sometimes doesn't eat at all. Discussed heart healthy eating and pulmonary MNT. Pt A1c 5.6 - eats many "naked" and simple CHOs, likely low A1C due to lower food consumption. Pt reports not eating much and sometimes does not eat at all. Pt also reports weight loss.      Intervention Plan   Intervention Prescribe, educate and counsel regarding individualized specific dietary modifications aiming towards targeted core components such as weight, hypertension, lipid management, diabetes,  heart failure and other comorbidities.;Nutrition handout(s) given to patient.    Expected Outcomes Short Term Goal: Understand basic principles of dietary content, such as calories, fat, sodium, cholesterol and nutrients.;Short Term Goal: A plan has been developed with personal nutrition goals set during dietitian appointment.;Long Term Goal: Adherence to prescribed nutrition plan.           Nutrition Assessments:  Nutrition Assessments - 09/08/19 1303      MEDFICTS Scores   Pre Score 40           MEDIFICTS Score Key:          ?70 Need  to make dietary changes          40-70 Heart Healthy Diet         ? 40 Therapeutic Level Cholesterol Diet  Nutrition Goals Re-Evaluation:   Nutrition Goals Discharge (Final Nutrition Goals Re-Evaluation):   Psychosocial: Target Goals: Acknowledge presence or absence of significant depression and/or stress, maximize coping skills, provide positive support system. Participant is able to verbalize types and ability to use techniques and skills needed for reducing stress and depression.   Education: Depression - Provides group verbal and written instruction on the correlation between heart/lung disease and depressed mood, treatment options, and the stigmas associated with seeking treatment.   Education: Sleep Hygiene -Provides group verbal and written instruction about how sleep can affect your health.  Define sleep hygiene, discuss sleep cycles and impact of sleep habits. Review good sleep hygiene tips.    Pulmonary Rehab from 08/15/2017 in [facility] Cardiac and Pulmonary Rehab  Date 08/15/17  Educator [facility]  Instruction Review Code 1- Verbalizes Understanding      Education: Stress and Anxiety: - Provides group verbal and written instruction about the health risks of elevated stress and causes of high stress.  Discuss the correlation between heart/lung disease and anxiety and treatment options. Review healthy ways to manage with stress and anxiety.    Pulmonary Rehab from 08/15/2017 in [facility] Cardiac and Pulmonary Rehab  Date 07/04/17  Educator [facility]  Instruction Review Code 1- Verbalizes Understanding      Initial Review & Psychosocial Screening:  Initial Psych Review & Screening - 09/05/19 1028      Initial Review   Current issues with Current Stress Concerns    Source of Stress Concerns Unable to perform yard/household activities;Unable to participate in former interests or hobbies;Chronic Illness    Comments COPD is well controlled, was told his lung tests are worse.  his knees prevent him from participating with daily chores      [place]? Yes   Girlfriend Mario [name], Mario [name]'s sister in [place],  [place] in [place] and [place] Girlfriend Mario [name], Mario [name]'s sister in [place],  [place] in [place] and [place]      Barriers   Psychosocial barriers to participate in program There are no identifiable barriers or psychosocial needs.;The patient should benefit from training in stress management and relaxation.      Screening Interventions   Interventions Encouraged to exercise;To provide support and resources with identified psychosocial needs;Provide feedback about the scores to participant    Expected Outcomes Short Term goal: Utilizing psychosocial counselor, staff and physician to assist with identification of specific Stressors or current issues interfering with healing process. Setting desired goal for each stressor or current issue identified.;Long Term Goal: Stressors or current issues are controlled or eliminated.;Short Term goal: Identification and review with participant of any Quality of Life or Depression concerns found by scoring the questionnaire.;Long Term goal: The participant improves quality of Life and PHQ9 Scores as seen by post scores and/or verbalization of changes           Quality of Life Scores:  Scores of 19 and below usually indicate a poorer quality of life in these areas.  A difference of  2-3  points is a clinically meaningful difference.  A difference of 2-3 points in the total score of the Quality of Life Index has been associated with significant improvement in overall quality of life, self-image, physical symptoms, and general health in studies assessing change in quality of  life.  PHQ-9: Recent Review Flowsheet Data    Depression screen [facility] 2/9 10/01/2019 09/08/2019 06/25/2019 08/15/2017 07/11/2017   Decreased Interest 2 3  0 1 1   Down, Depressed, Hopeless 1 2 0 0 1   PHQ - 2 Score 3 5 0 1 2   Altered sleeping 1 3  - 0 0   Tired, decreased energy 3 3  - 2 3   Change in appetite 0 0 - 0 2   Feeling bad or failure about yourself  0 0 - 0 2   Trouble concentrating 1 1 - 0 1   Moving slowly or fidgety/restless 0 1 - 0 0   Suicidal thoughts 0 0 - 0 0   PHQ-9 Score 8 13 - 3 10   Difficult doing work/chores Not difficult at all Somewhat difficult - Not difficult at all Somewhat difficult     Interpretation of Total Score  Total Score Depression Severity:  1-4 = Minimal depression, 5-9 = Mild depression, 10-14 = Moderate depression, 15-19 = Moderately severe depression, 20-27 = Severe depression   Psychosocial Evaluation and Intervention:  Psychosocial Evaluation - 09/05/19 1041      Psychosocial Evaluation & Interventions   Interventions Encouraged to exercise with the program and follow exercise prescription    Comments Mario [name] has no barriers to returning to the program after over a year. He stated that he has not been doing his usual exercise regimen due to the [place] [facility]HE. He is glad to have the referral so he can get back to a routine of exercise . He lives with his girlfrient Mario [name] (calls her Mario [name]) and 2 dogs. He has a great support system with Mario [name]. Other support is Mario [name]'s sister in [place], who is a Marine scientist and his children that live in [place] and [place]. His knees may inhibit his exercise function. He is getting injections in his knees and the injections do help his pain. He still has  pain "under my kneecap" He is hoping this exercise will help with the knee pain. We did talk about PT for his knees. His physician was going to order PT. Mario [name] should do well with the program    Expected Outcomes STG: Mario [name] will benefit from consistent exercise to achieve his stated goals.  LTG Mario [name] will after discharge be able to continue with his exercise to maintain his progress    Continue Psychosocial Services  Follow up required by staff           Psychosocial Re-Evaluation:  Psychosocial Re-Evaluation    [place] Name 10/01/19 [phone]             Psychosocial Re-Evaluation   Current issues with Current Depression;Current Stress Concerns;Current Sleep Concerns       Comments Mario [name] is doing well in rehab. His PHQ score has improved from 13 to 8!  He is feeling better getting back into an exercise routine. He is also feleing like his attitude has gotten better.  He is now on a mission to lose weight.  He is using his CPAP again and helping him sleep better.       Expected Outcomes Short: Continue with new attitude. Long: Continue to stay positive.       Interventions Encouraged to attend Pulmonary Rehabilitation for the exercise;Stress management education       Continue Psychosocial Services  Follow up required by staff              Psychosocial Discharge (Final Psychosocial Re-Evaluation):  Psychosocial Re-Evaluation - 10/01/19 3846      Psychosocial Re-Evaluation   Current issues with Current Depression;Current Stress Concerns;Current Sleep Concerns    Comments Mario [name] is doing well in rehab. His PHQ score has improved from 13 to 8!  He is feeling better getting back into an exercise routine. He is also feleing like his attitude has gotten better.  He is now on a mission to lose weight.  He is using his CPAP again and helping him sleep better.    Expected Outcomes Short: Continue with new attitude. Long: Continue to stay positive.    Interventions Encouraged to attend Pulmonary  Rehabilitation for the exercise;Stress management education    Continue Psychosocial Services  Follow up required by staff           Education: Education Goals: Education classes will be provided on a weekly basis, covering required topics. Participant will state understanding/return demonstration of topics presented.  Learning Barriers/Preferences:  Learning Barriers/Preferences - 09/05/19 1034      Learning Barriers/Preferences   Learning Barriers None    Learning Preferences None           General Pulmonary Education Topics:  Infection Prevention: - Provides verbal and written material to individual with discussion of infection control including proper hand washing and proper equipment cleaning during exercise session.   Pulmonary Rehab from 09/08/2019 in [facility] Cardiac and Pulmonary Rehab  Date 09/08/19  Educator AS  Instruction Review Code 1- Verbalizes Understanding      Falls Prevention: - Provides verbal and written material to individual with discussion of falls prevention and safety.   Pulmonary Rehab from 09/08/2019 in [facility] Cardiac and Pulmonary Rehab  Date 09/08/19  Educator AS  Instruction Review Code 1- Verbalizes Understanding      Chronic Lung Diseases: - Group verbal and written instruction to review updates, respiratory medications, advancements in procedures and treatments. Discuss use of supplemental oxygen including available portable oxygen systems, continuous and intermittent flow rates, concentrators, personal use and safety guidelines. Review proper use of inhaler and spacers. Provide informative websites for self-education.    Pulmonary Rehab from 08/15/2017 in [facility] Cardiac and Pulmonary Rehab  Date 07/25/17  Educator [facility]  Instruction Review Code 1- Verbalizes Understanding      Energy Conservation: - Provide group verbal and written instruction for methods to conserve energy, plan and organize activities. Instruct on pacing techniques, use of  adaptive equipment and posture/positioning to relieve shortness of breath.   Pulmonary Rehab from 08/15/2017 in [facility] Cardiac and Pulmonary Rehab  Date 06/27/17  Educator [facility]  Instruction Review Code 1- Verbalizes Understanding      Triggers and Exacerbations: - Group verbal and written instruction to review types of environmental triggers and ways to prevent exacerbations. Discuss weather changes, air quality and the benefits of nasal washing. Review warning signs and symptoms to help prevent infections. Discuss techniques for effective airway clearance, coughing, and vibrations.   Pulmonary Rehab from 05/03/2016 in [facility] Cardiac and Pulmonary Rehab  Date 04/26/16  Educator LB  Instruction Review Code (retired) 2- meets goals/outcomes      AED/CPR: - Group verbal and written instruction with the use of models to demonstrate the basic use of the AED with the basic ABC's of resuscitation.   Pulmonary Rehab from 08/15/2017 in [facility] Cardiac and Pulmonary Rehab  Date 06/29/17  Educator [facility]  Instruction Review Code 1- Actuary and Physiology of the Lungs: - Group verbal and written instruction  with the use of models to provide basic lung anatomy and physiology related to function, structure and complications of lung disease.   Pulmonary Rehab from 08/15/2017 in [facility] Cardiac and Pulmonary Rehab  Date 07/11/17  Educator [facility]  Instruction Review Code 1- Verbalizes Understanding      Anatomy & Physiology of the Heart: - Group verbal and written instruction and models provide basic cardiac anatomy and physiology, with the coronary electrical and arterial systems. Review of Valvular disease and Heart Failure   Cardiac Medications: - Group verbal and written instruction to review commonly prescribed medications for heart disease. Reviews the medication, class of the drug, and side effects.   Pulmonary Rehab from 08/15/2017 in [facility] Cardiac and Pulmonary Rehab  Date  06/15/17  Educator [facility]  Instruction Review Code 1- Verbalizes Understanding      Other: -Provides group and verbal instruction on various topics (see comments)   Pulmonary Rehab from 08/15/2017 in [facility] Cardiac and Pulmonary Rehab  Date 05/23/17  Educator [facility]  Instruction Review Code 1- Verbalizes Understanding  [SLEEP]      Knowledge Questionnaire Score:  Knowledge Questionnaire Score - 09/08/19 1303      Knowledge Questionnaire Score   Pre Score 16/18            Core Components/Risk Factors/Patient Goals at Admission:  Personal Goals and Risk Factors at Admission - 09/08/19 1314      Core Components/Risk Factors/Patient Goals on Admission    Weight Management Weight Loss;Yes    Intervention Weight Management: Develop a combined nutrition and exercise program designed to reach desired caloric intake, while maintaining appropriate intake of nutrient and fiber, sodium and fats, and appropriate energy expenditure required for the weight goal.;Weight Management: Provide education and appropriate resources to help participant work on and attain dietary goals.;Weight Management/Obesity: Establish reasonable short term and long term weight goals.;Obesity: Provide education and appropriate resources to help participant work on and attain dietary goals.    Admit Weight 221 lb 4.8 oz (100.4 kg)    Goal Weight: Short Term 215 lb (97.5 kg)    Goal Weight: Long Term 200 lb (90.7 kg)    Expected Outcomes Short Term: Continue to assess and modify interventions until short term weight is achieved;Long Term: Adherence to nutrition and physical activity/exercise program aimed toward attainment of established weight goal;Weight Maintenance: Understanding of the daily nutrition guidelines, which includes 25-35% calories from fat, 7% or less cal from saturated fats, less than 279m cholesterol, less than 1.5gm of sodium, & 5 or more servings of fruits and vegetables daily;Weight Loss: Understanding of  general recommendations for a balanced deficit meal plan, which promotes 1-2 lb weight loss per week and includes a negative energy balance of [phone] kcal/d    Diabetes Yes    Intervention Provide education about signs/symptoms and action to take for hypo/hyperglycemia.;Provide education about proper nutrition, including hydration, and aerobic/resistive exercise prescription along with prescribed medications to achieve blood glucose in normal ranges: Fasting glucose 65-99 mg/dL    Expected Outcomes Short Term: Participant verbalizes understanding of the signs/symptoms and immediate care of hyper/hypoglycemia, proper foot care and importance of medication, aerobic/resistive exercise and nutrition plan for blood glucose control.;Long Term: Attainment of HbA1C < 7%.    Intervention Provide a combined exercise and nutrition program that is supplemented with education, support and counseling about heart failure. Directed toward relieving symptoms such as shortness of breath, decreased exercise tolerance, and extremity edema.    Expected Outcomes Improve functional capacity of life;Short  term: Attendance in program 2-3 days a week with increased exercise capacity. Reported lower sodium intake. Reported increased fruit and vegetable intake. Reports medication compliance.;Short term: Daily weights obtained and reported for increase. Utilizing diuretic protocols set by physician.;Long term: Adoption of self-care skills and reduction of barriers for early signs and symptoms recognition and intervention leading to self-care maintenance.    Intervention Provide education on lifestyle modifcations including regular physical activity/exercise, weight management, moderate sodium restriction and increased consumption of fresh fruit, vegetables, and low fat dairy, alcohol moderation, and smoking cessation.;Monitor prescription use compliance.    Expected Outcomes Short Term: Continued assessment and intervention until BP is  < 140/40m HG in hypertensive participants. < 130/846mHG in hypertensive participants with diabetes, heart failure or chronic kidney disease.;Long Term: Maintenance of blood pressure at goal levels.           Education:Diabetes - Individual verbal and written instruction to review signs/symptoms of diabetes, desired ranges of glucose level fasting, after meals and with exercise. Acknowledge that pre and post exercise glucose checks will be done for 3 sessions at entry of program.   [facility]  Date 04/04/16  Educator LB  Instruction Review Code (retired) 2- meets goals/outcomes      Education: Know Your Numbers and Risk Factors: -Group verbal and written instruction about important numbers in your health.  Discussion of what are risk factors and how they play a role in the disease process.  Review of Cholesterol, Blood Pressure, Diabetes, and BMI and the role they play in your overall health.   [facility]  Date 08/10/17  [facility] Review Code 1- Verbalizes Understanding      Core Components/Risk Factors/Patient Goals Review:   Goals and Risk Factor Review    Row Name 10/01/19 [id number]           Core Components/Risk Factors/Patient Goals Review   Personal Goals Review Weight Management/Obesity;Improve shortness of breath with ADL's;Heart Failure;Hypertension       Review LaNydia Boutons doing well in rehab.  He is now on a new mission to lose weight and get under 200 lb.  His breathing is getting better and he does not need his oxygen at rest at home.  He denies heart failure symptoms currently and saw Mario Kluverecently and got a good report.  He is scheduled for an echo on June 16.  Blood pressures have been good and he does check them at home.       Expected Outcomes Short: Continue to work on weight loss Long: Continue to improve breathing using PLB.               Core Components/Risk Factors/Patient Goals at Discharge (Final Review):   Goals and Risk Factor Review - 10/01/19 0948      Core Components/Risk Factors/Patient Goals Review   Personal Goals Review Weight Management/Obesity;Improve shortness of breath with ADL's;Heart Failure;Hypertension    Review LaNydia Boutons doing well in rehab.  He is now on a new mission to lose weight and get under 200 lb.  His breathing is getting better and he does not need his oxygen at rest at home.  He denies heart failure symptoms currently and saw Mario Kluverecently and got a good report.  He is scheduled for an echo on June 16.  Blood pressures have been good and he does check them at home.  Expected Outcomes Short: Continue to work on weight loss Long: Continue to improve breathing using PLB.           ITP Comments:  ITP Comments    Row Name 09/05/19 1004 09/08/19 1312 09/10/19 1129 09/17/19 0533 10/15/19 0548   ITP Comments Virtual Orientation completed today. Exercise evaluation scheduled for Monday 5/10 with gym orientation. Documentation for diagnosis can be found in [facility] 4/21. Completed 6MWT and gym orientation.  Initial ITP created and sent for review to Dr. Emily Filbert, Medical Director. First full day of exercise!  Patient was oriented to gym and equipment including functions, settings, policies, and procedures.  Patient's individual exercise prescription and treatment plan were reviewed.  All starting workloads were established based on the results of the 6 minute walk test done at initial orientation visit.  The plan for exercise progression was also introduced and progression will be customized based on patient's performance and goals. 30 Day review completed. ITP review done, changes made as directed,and approval shown by signature of  Scientist, research (life sciences).  New to program 30 Day review completed. Medical Director ITP review done, changes made as directed, and signed approval by Medical Director.           Comments: 30 Day review completed. Medical Director ITP review done, changes made as directed, and signed approval by Medical Director.

## 2019-10-17 ENCOUNTER — Encounter: Payer: Medicare Other | Admitting: *Deleted

## 2019-10-17 ENCOUNTER — Other Ambulatory Visit: Payer: Self-pay

## 2019-10-17 DIAGNOSIS — J449 Chronic obstructive pulmonary disease, unspecified: Secondary | ICD-10-CM

## 2019-10-17 NOTE — Progress Notes (Signed)
Daily Session Note  Patient Details  Name: Mario [name] MRN: [id number] Date of Birth: [birth year] Referring Provider:     Pulmonary Rehab from 09/08/2019 in [facility] Cardiac and Pulmonary Rehab  Referring Provider [name]      Encounter Date: 10/17/2019  Check In:  Session Check In - 10/17/19 0936      Check-In   Supervising physician immediately available to respond to emergencies See telemetry face sheet for immediately available ER MD    Location [facility]-Cardiac & Pulmonary Rehab    Staff Present Renita Papa, RN BSN;Joseph [address] [place], [place], [place], CCRP, CCET    Virtual Visit No    Medication changes reported     No    Fall or balance concerns reported    No    Warm-up and Cool-down Performed on first and last piece of equipment    Resistance Training Performed Yes    VAD Patient? No    PAD/SET Patient? No      Pain Assessment   Currently in Pain? No/denies              Social History   Tobacco Use  Smoking Status Former Smoker  . Packs/day: 0.50  . Years: 1.00  . Pack years: 0.50  . Types: Cigarettes  . Quit date: 05/01/1969  . Years since quitting: 50.4  Smokeless Tobacco Never Used  Tobacco Comment   Quit 1971    Goals Met:  Independence with exercise equipment Exercise tolerated well No report of cardiac concerns or symptoms Strength training completed today  Goals Unmet:  Not Applicable  Comments: Pt able to follow exercise prescription today without complaint.  Will continue to monitor for progression.    Dr. Emily Filbert is Medical Director for [place] and [name].

## 2019-10-20 ENCOUNTER — Encounter: Payer: Medicare Other | Admitting: *Deleted

## 2019-10-20 ENCOUNTER — Other Ambulatory Visit: Payer: Self-pay

## 2019-10-20 DIAGNOSIS — J449 Chronic obstructive pulmonary disease, unspecified: Secondary | ICD-10-CM

## 2019-10-20 NOTE — Progress Notes (Signed)
Daily Session Note  Patient Details  Name: Mario [name] MRN: [id number] Date of Birth: 06-Jul-1945 Referring Provider:     Pulmonary Rehab from 09/08/2019 in [facility] Cardiac and Pulmonary Rehab  Referring Provider [name]      Encounter Date: 10/20/2019  Check In:  Session Check In - 10/20/19 0944      Check-In   Location [facility]-Cardiac & Pulmonary Rehab    Staff Present Heath Lark, RN, BSN, Laveda Norman, BS, ACSM CEP, Exercise Physiologist;Joseph Tessie Fass RCP,RRT,BSRT    Virtual Visit No    Medication changes reported     No    Fall or balance concerns reported    No    Warm-up and Cool-down Performed on first and last piece of equipment    Resistance Training Performed Yes    VAD Patient? No    PAD/SET Patient? No      Pain Assessment   Currently in Pain? No/denies              Social History   Tobacco Use  Smoking Status Former Smoker  . Packs/day: 0.50  . Years: 1.00  . Pack years: 0.50  . Types: Cigarettes  . Quit date: 05/01/1969  . Years since quitting: 50.5  Smokeless Tobacco Never Used  Tobacco Comment   Quit 1971    Goals Met:  Proper associated with RPD/PD & O2 Sat Independence with exercise equipment Exercise tolerated well No report of cardiac concerns or symptoms  Goals Unmet:  Not Applicable  Comments: Pt able to follow exercise prescription today without complaint.  Will continue to monitor for progression.    Dr. Emily Filbert is Medical Director for [place] and [name].

## 2019-10-22 ENCOUNTER — Encounter: Payer: Medicare Other | Admitting: *Deleted

## 2019-10-22 ENCOUNTER — Other Ambulatory Visit: Payer: Self-pay

## 2019-10-22 DIAGNOSIS — J449 Chronic obstructive pulmonary disease, unspecified: Secondary | ICD-10-CM

## 2019-10-22 NOTE — Progress Notes (Signed)
Daily Session Note  Patient Details  Name: Geral Coker MRN: [id number] Date of Birth: 1946-01-11 Referring Provider:     Pulmonary Rehab from 09/08/2019 in [facility] Cardiac and Pulmonary Rehab  Referring Provider [name]      Encounter Date: 10/22/2019  Check In:  Session Check In - 10/22/19 0941      Check-In   Supervising physician immediately available to respond to emergencies See telemetry face sheet for immediately available ER MD    Location [facility]-Cardiac & Pulmonary Rehab    Staff Present Heath Lark, RN, BSN, CCRP;Amanda Sommer, BA, ACSM CEP, Exercise Physiologist;Melissa Caiola RDN, LDN    Virtual Visit No    Medication changes reported     No    Fall or balance concerns reported    No    Warm-up and Cool-down Performed on first and last piece of equipment    Resistance Training Performed Yes    VAD Patient? No    PAD/SET Patient? No      Pain Assessment   Currently in Pain? No/denies              Social History   Tobacco Use  Smoking Status Former Smoker  . Packs/day: 0.50  . Years: 1.00  . Pack years: 0.50  . Types: Cigarettes  . Quit date: 05/01/1969  . Years since quitting: 50.5  Smokeless Tobacco Never Used  Tobacco Comment   Quit 1971    Goals Met:  Proper associated with RPD/PD & O2 Sat Independence with exercise equipment Exercise tolerated well No report of cardiac concerns or symptoms  Goals Unmet:  Not Applicable  Comments: Pt able to follow exercise prescription today without complaint.  Will continue to monitor for progression.    Dr. Emily Filbert is Medical Director for [place] and [name].

## 2019-10-24 ENCOUNTER — Encounter: Payer: Medicare Other | Admitting: *Deleted

## 2019-10-24 ENCOUNTER — Other Ambulatory Visit: Payer: Self-pay

## 2019-10-24 DIAGNOSIS — J449 Chronic obstructive pulmonary disease, unspecified: Secondary | ICD-10-CM

## 2019-10-24 NOTE — Progress Notes (Signed)
Daily Session Note  Patient Details  Name: Mario [name] MRN: [id number] Date of Birth: 1946-01-15 Referring Provider:     Pulmonary Rehab from 09/08/2019 in [facility] Cardiac and Pulmonary Rehab  Referring Provider [name]      Encounter Date: 10/24/2019  Check In:      Social History   Tobacco Use  Smoking Status Former Smoker  . Packs/day: 0.50  . Years: 1.00  . Pack years: 0.50  . Types: Cigarettes  . Quit date: 05/01/1969  . Years since quitting: 50.5  Smokeless Tobacco Never Used  Tobacco Comment   Quit 1971    Goals Met:  Independence with exercise equipment Exercise tolerated well No report of cardiac concerns or symptoms Strength training completed today  Goals Unmet:  Not Applicable  Comments: Pt able to follow exercise prescription today without complaint.  Will continue to monitor for progression.   Dr. Emily Filbert is Medical Director for [place] and [name].

## 2019-10-27 ENCOUNTER — Encounter: Payer: Medicare Other | Admitting: *Deleted

## 2019-10-27 ENCOUNTER — Other Ambulatory Visit: Payer: Self-pay

## 2019-10-27 DIAGNOSIS — J449 Chronic obstructive pulmonary disease, unspecified: Secondary | ICD-10-CM | POA: Diagnosis not present

## 2019-10-27 NOTE — Progress Notes (Signed)
Daily Session Note  Patient Details  Name: Mario [name] MRN: [id number] Date of Birth: Jan 15, 1946 Referring Provider:     Pulmonary Rehab from 09/08/2019 in [facility] Cardiac and Pulmonary Rehab  Referring Provider [name]      Encounter Date: 10/27/2019  Check In:  Session Check In - 10/27/19 0951      Check-In   Supervising physician immediately available to respond to emergencies See telemetry face sheet for immediately available ER MD    Location [facility]-Cardiac & Pulmonary Rehab    Staff Present Heath Lark, RN, BSN, CCRP;Joseph [place], [place], ACSM CEP, Exercise Physiologist    Virtual Visit No    Medication changes reported     No    Fall or balance concerns reported    No    Warm-up and Cool-down Performed on first and last piece of equipment    Resistance Training Performed Yes    VAD Patient? No    PAD/SET Patient? No      Pain Assessment   Currently in Pain? No/denies              Social History   Tobacco Use  Smoking Status Former Smoker  . Packs/day: 0.50  . Years: 1.00  . Pack years: 0.50  . Types: Cigarettes  . Quit date: 05/01/1969  . Years since quitting: 50.5  Smokeless Tobacco Never Used  Tobacco Comment   Quit 1971    Goals Met:  Proper associated with RPD/PD & O2 Sat Independence with exercise equipment Exercise tolerated well No report of cardiac concerns or symptoms  Goals Unmet:  Not Applicable  Comments: Pt able to follow exercise prescription today without complaint.  Will continue to monitor for progression.    Dr. Emily Filbert is Medical Director for [place] and [name].

## 2019-10-31 ENCOUNTER — Encounter: Payer: Medicare Other | Attending: Specialist | Admitting: *Deleted

## 2019-10-31 ENCOUNTER — Other Ambulatory Visit: Payer: Self-pay

## 2019-10-31 DIAGNOSIS — G4730 Sleep apnea, unspecified: Secondary | ICD-10-CM | POA: Diagnosis not present

## 2019-10-31 DIAGNOSIS — J449 Chronic obstructive pulmonary disease, unspecified: Secondary | ICD-10-CM | POA: Insufficient documentation

## 2019-10-31 NOTE — Progress Notes (Signed)
Daily Session Note  Patient Details  Name: Matther Labell MRN: [id number] Date of Birth: June 01, 1945 Referring Provider:     Pulmonary Rehab from 09/08/2019 in [facility] Cardiac and Pulmonary Rehab  Referring Provider [name]      Encounter Date: 10/31/2019  Check In:  Session Check In - 10/31/19 0929      Check-In   Supervising physician immediately available to respond to emergencies See telemetry face sheet for immediately available ER MD    Location [facility]-Cardiac & Pulmonary Rehab    Staff Present Nyoka Cowden, RN, BSN, MA;Joseph [address] [place], [place], RCEP, CCRP, CCET    Virtual Visit No    Medication changes reported     No    Fall or balance concerns reported    No    Tobacco Cessation No Change    Warm-up and Cool-down Performed on first and last piece of equipment    Resistance Training Performed Yes    VAD Patient? No      Pain Assessment   Currently in Pain? No/denies              Social History   Tobacco Use  Smoking Status Former Smoker  . Packs/day: 0.50  . Years: 1.00  . Pack years: 0.50  . Types: Cigarettes  . Quit date: 05/01/1969  . Years since quitting: 50.5  Smokeless Tobacco Never Used  Tobacco Comment   Quit 1971    Goals Met:  Independence with exercise equipment Exercise tolerated well No report of cardiac concerns or symptoms Strength training completed today  Goals Unmet:  Not Applicable  Comments: Pt able to follow exercise prescription today without complaint.  Will continue to monitor for progression.    Dr. Emily Filbert is Medical Director for [place] and [name].

## 2019-11-05 ENCOUNTER — Encounter: Payer: Medicare Other | Admitting: *Deleted

## 2019-11-05 ENCOUNTER — Other Ambulatory Visit: Payer: Self-pay

## 2019-11-05 DIAGNOSIS — J449 Chronic obstructive pulmonary disease, unspecified: Secondary | ICD-10-CM

## 2019-11-05 NOTE — Progress Notes (Signed)
Daily Session Note  Patient Details  Name: Daven Montz MRN: [id number] Date of Birth: [birth year] Referring Provider:     Pulmonary Rehab from 09/08/2019 in [facility] Cardiac and Pulmonary Rehab  Referring Provider [name]      Encounter Date: 11/05/2019  Check In:  Session Check In - 11/05/19 0916      Check-In   Supervising physician immediately available to respond to emergencies See telemetry face sheet for immediately available ER MD    Location [facility]-Cardiac & Pulmonary Rehab    Staff Present Renita Papa, RN BSN;Joseph Hood RCP,RRT,BSRT;Amanda Oletta [place], [place], ACSM CEP, Exercise Physiologist    Virtual Visit No    Medication changes reported     No    Fall or balance concerns reported    No    Warm-up and Cool-down Performed on first and last piece of equipment    Resistance Training Performed Yes    VAD Patient? No    PAD/SET Patient? No      Pain Assessment   Currently in Pain? No/denies              Social History   Tobacco Use  Smoking Status Former Smoker  . Packs/day: 0.50  . Years: 1.00  . Pack years: 0.50  . Types: Cigarettes  . Quit date: 05/01/1969  . Years since quitting: 50.5  Smokeless Tobacco Never Used  Tobacco Comment   Quit 1971    Goals Met:  Independence with exercise equipment Exercise tolerated well No report of cardiac concerns or symptoms Strength training completed today  Goals Unmet:  Not Applicable  Comments: Pt able to follow exercise prescription today without complaint.  Will continue to monitor for progression.    Dr. Emily Filbert is Medical Director for [place] and [name].

## 2019-11-07 ENCOUNTER — Encounter: Payer: Medicare Other | Admitting: *Deleted

## 2019-11-07 ENCOUNTER — Other Ambulatory Visit: Payer: Self-pay

## 2019-11-07 DIAGNOSIS — J449 Chronic obstructive pulmonary disease, unspecified: Secondary | ICD-10-CM

## 2019-11-07 NOTE — Progress Notes (Signed)
Daily Session Note  Patient Details  Name: Mykale Gandolfo MRN: [id number] Date of Birth: 09-19-1945 Referring Provider:     Pulmonary Rehab from 09/08/2019 in [facility] Cardiac and Pulmonary Rehab  Referring Provider [name]      Encounter Date: 11/07/2019  Check In:  Session Check In - 11/07/19 1030      Check-In   Supervising physician immediately available to respond to emergencies See telemetry face sheet for immediately available ER MD    Location [facility]Cardiac & Pulmonary Rehab    Staff Present Nyoka Cowden, RN, BSN, MA;Joseph [address] [place], [place], RCEP, CCRP, CCET    Virtual Visit No    Medication changes reported     No    Fall or balance concerns reported    No    Tobacco Cessation No Change    Warm-up and Cool-down Performed on first and last piece of equipment    Resistance Training Performed Yes    VAD Patient? No      Pain Assessment   Currently in Pain? No/denies              Social History   Tobacco Use  Smoking Status Former Smoker  . Packs/day: 0.50  . Years: 1.00  . Pack years: 0.50  . Types: Cigarettes  . Quit date: 05/01/1969  . Years since quitting: 50.5  Smokeless Tobacco Never Used  Tobacco Comment   Quit 1971    Goals Met:  Independence with exercise equipment Exercise tolerated well No report of cardiac concerns or symptoms Strength training completed today  Goals Unmet:  Not Applicable  Comments: Pt able to follow exercise prescription today without complaint.  Will continue to monitor for progression.   Dr. Emily Filbert is Medical Director for [place] and [name].

## 2019-11-10 ENCOUNTER — Encounter: Payer: Medicare Other | Admitting: *Deleted

## 2019-11-10 ENCOUNTER — Other Ambulatory Visit: Payer: Self-pay

## 2019-11-10 DIAGNOSIS — J449 Chronic obstructive pulmonary disease, unspecified: Secondary | ICD-10-CM | POA: Diagnosis not present

## 2019-11-10 NOTE — Progress Notes (Signed)
Daily Session Note  Patient Details  Name: Mario [name] MRN: [id number] Date of Birth: 17-Jun-1945 Referring Provider:     Pulmonary Rehab from 09/08/2019 in [facility] Cardiac and Pulmonary Rehab  Referring Provider [name]      Encounter Date: 11/10/2019  Check In:  Session Check In - 11/10/19 3912      Check-In   Supervising physician immediately available to respond to emergencies See telemetry face sheet for immediately available ER MD    Location [facility]-Cardiac & Pulmonary Rehab    Staff Present Heath Lark, RN, BSN, Laveda Norman, BS, ACSM CEP, Exercise Physiologist;Joseph Tessie Fass RCP,RRT,BSRT    Virtual Visit No    Medication changes reported     No    Fall or balance concerns reported    No    Warm-up and Cool-down Performed on first and last piece of equipment    Resistance Training Performed Yes    VAD Patient? No    PAD/SET Patient? No      Pain Assessment   Currently in Pain? No/denies              Social History   Tobacco Use  Smoking Status Former Smoker  . Packs/day: 0.50  . Years: 1.00  . Pack years: 0.50  . Types: Cigarettes  . Quit date: 05/01/1969  . Years since quitting: 50.5  Smokeless Tobacco Never Used  Tobacco Comment   Quit 1971    Goals Met:  Proper associated with RPD/PD & O2 Sat Independence with exercise equipment Exercise tolerated well No report of cardiac concerns or symptoms  Goals Unmet:  Not Applicable  Comments: Pt able to follow exercise prescription today without complaint.  Will continue to monitor for progression.    Dr. Emily Filbert is Medical Director for [place] and [name].

## 2019-11-12 ENCOUNTER — Encounter: Payer: Self-pay | Admitting: *Deleted

## 2019-11-12 ENCOUNTER — Other Ambulatory Visit: Payer: Self-pay

## 2019-11-12 ENCOUNTER — Encounter: Payer: Medicare Other | Admitting: *Deleted

## 2019-11-12 DIAGNOSIS — J449 Chronic obstructive pulmonary disease, unspecified: Secondary | ICD-10-CM | POA: Diagnosis not present

## 2019-11-12 NOTE — Progress Notes (Signed)
Daily Session Note  Patient Details  Name: Lonnie Reth MRN: [id number] Date of Birth: 01-23-1946 Referring Provider:     Pulmonary Rehab from 09/08/2019 in [facility] Cardiac and Pulmonary Rehab  Referring Provider [name]      Encounter Date: 11/12/2019  Check In:  Session Check In - 11/12/19 0949      Check-In   Supervising physician immediately available to respond to emergencies See telemetry face sheet for immediately available ER MD    Location [facility]-Cardiac & Pulmonary Rehab    Staff Present Heath Lark, RN, BSN, CCRP;Joseph Foy [place], [place], ACSM CEP, Exercise Physiologist    Virtual Visit No    Medication changes reported     No    Fall or balance concerns reported    No    Warm-up and Cool-down Performed on first and last piece of equipment    Resistance Training Performed Yes    VAD Patient? No    PAD/SET Patient? No      Pain Assessment   Currently in Pain? No/denies              Social History   Tobacco Use  Smoking Status Former Smoker   Packs/day: 0.50   Years: 1.00   Pack years: 0.50   Types: Cigarettes   Quit date: 05/01/1969   Years since quitting: 50.5  Smokeless Tobacco Never Used  Tobacco Comment   Quit 1971    Goals Met:  Proper associated with RPD/PD & O2 Sat Independence with exercise equipment Exercise tolerated well No report of cardiac concerns or symptoms  Goals Unmet:  Not Applicable  Comments: Pt able to follow exercise prescription today without complaint.  Will continue to monitor for progression.    Dr. Emily Filbert is Medical Director for [place] and [name].

## 2019-11-12 NOTE — Progress Notes (Signed)
Pulmonary Individual Treatment Plan  Patient Details  Name: Mario [name] MRN: [id number] Date of Birth: [birth year] Referring Provider:     Pulmonary Rehab from 09/08/2019 in [facility] Cardiac and Pulmonary Rehab  Referring Provider [name]      Initial Encounter Date:    Pulmonary Rehab from 09/08/2019 in [facility] Cardiac and Pulmonary Rehab  Date 09/08/19      Visit Diagnosis: Chronic obstructive pulmonary disease, unspecified COPD type ([place])  Patient's Home Medications on Admission:  Current Outpatient Medications:  .  albuterol (PROVENTIL HFA;VENTOLIN HFA) 108 (90 Base) MCG/ACT inhaler, Inhale 2 puffs into the lungs as needed., Disp: , Rfl:  .  APPLE CIDER VINEGAR PO, Take by mouth daily., Disp: , Rfl:  .  celecoxib (CELEBREX) 200 MG capsule, , Disp: , Rfl:  .  DULoxetine (CYMBALTA) 60 MG capsule, am, Disp: , Rfl:  .  edoxaban (SAVAYSA) 60 MG TABS tablet, Take 60 mg by mouth daily. cardiology, Disp: , Rfl:  .  Fluticasone-Umeclidin-Vilant (TRELEGY ELLIPTA IN), Inhale into the lungs daily., Disp: , Rfl:  .  furosemide (LASIX) 80 MG tablet, Take 1 tablet by mouth 2 (two) times daily. 80 mg AM, 120 mg PM, Disp: , Rfl:  .  gabapentin (NEURONTIN) 300 MG capsule, Take 800 mg by mouth 3 (three) times daily. , Disp: , Rfl:  .  gabapentin (NEURONTIN) 800 MG tablet, Take 800 mg by mouth 3 (three) times daily., Disp: , Rfl:  .  glucose blood (ONE TOUCH ULTRA TEST) test strip, Use as instructed FOR TESTING three times daily.  E11.9, Disp: , Rfl:  .  ipratropium-albuterol (DUONEB) 0.5-2.5 (3) MG/3ML SOLN, Inhale into the lungs., Disp: , Rfl:  .  metFORMIN (GLUCOPHAGE) 1000 MG tablet, Take 1 tablet by mouth 2 (two) times daily., Disp: , Rfl:  .  metFORMIN (GLUCOPHAGE) 500 MG tablet, , Disp: , Rfl:  .  OZEMPIC, 1 MG/DOSE, 2 MG/1.5ML SOPN, , Disp: , Rfl:  .  ROCKLATAN 0.02-0.005 % SOLN, Apply 1 drop to eye at bedtime., Disp: , Rfl:  .  sotalol (BETAPACE) 80 MG tablet, Take 1 tablet by mouth 2 (two)  times daily., Disp: , Rfl:  .  SOTALOL AF 80 MG TABS, Take by mouth 2 (two) times daily. Breakfast and dinner, Disp: , Rfl:  .  traMADol (ULTRAM) 50 MG tablet, Take by mouth 3 (three) times daily., Disp: , Rfl:  .  TURMERIC PO, Take by mouth daily., Disp: , Rfl:   Past Medical History: Past Medical History:  Diagnosis Date  . Arthritis    hands, knees  . Atrial fibrillation ([place])   . CHF (congestive heart failure) ([place])   . COPD (chronic obstructive pulmonary disease) ([place])   . Diabetes mellitus without complication ([place])   . Diabetic neuropathy ([place])   . Heart murmur    in past, mild 1969  . Hypertension    resolved after weight loss and more activity  . Neuromuscular disorder ([facility])    neuropathy  . Neuropathy   . Pulmonary embolism ([place])    approx 2014  . Shingles '99  . Sleep apnea    CPAP  . Wears dentures    full upper and lower    Tobacco Use: Social History   Tobacco Use  Smoking Status Former Smoker  . Packs/day: 0.50  . Years: 1.00  . Pack years: 0.50  . Types: Cigarettes  . Quit date: 05/01/1969  . Years since quitting: 50.5  Smokeless Tobacco Never Used  Tobacco Comment  Quit 1971    Labs: Recent Chemical engineer    Labs for ITP Cardiac and Pulmonary Rehab Latest Ref Rng & Units 02/15/2016   Hemoglobin A1c 4.8 - 5.6 % 6.8(H)       Pulmonary Assessment Scores:  Pulmonary Assessment Scores    Row Name 09/08/19 1302         ADL UCSD   SOB Score total 28     Rest 0     Walk 1     Stairs 3     Bath 0     Dress 0     Shop 1       CAT Score   CAT Score 7       mMRC Score   mMRC Score 2            UCSD: Self-administered rating of dyspnea associated with activities of daily living (ADLs) 6-point scale (0 = "not at all" to 5 = "maximal or unable to do because of breathlessness")  Scoring Scores range from 0 to 120.  Minimally important difference is 5 units  CAT: CAT can identify the health impairment of COPD patients and is  better correlated with disease progression.  CAT has a scoring range of zero to 40. The CAT score is classified into four groups of low (less than 10), medium (10 - 20), high (21-30) and very high (31-40) based on the impact level of disease on health status. A CAT score over 10 suggests significant symptoms.  A worsening CAT score could be explained by an exacerbation, poor medication adherence, poor inhaler technique, or progression of COPD or comorbid conditions.  CAT MCID is 2 points  mMRC: mMRC (Modified Medical Research Council) Dyspnea Scale is used to assess the degree of baseline functional disability in patients of respiratory disease due to dyspnea. No minimal important difference is established. A decrease in score of 1 point or greater is considered a positive change.   Pulmonary Function Assessment:   Exercise Target Goals: Exercise Program Goal: Individual exercise prescription set using results from initial 6 min walk test and THRR while considering  patient's activity barriers and safety.   Exercise Prescription Goal: Initial exercise prescription builds to 30-45 minutes a day of aerobic activity, 2-3 days per week.  Home exercise guidelines will be given to patient during program as part of exercise prescription that the participant will acknowledge.  Education: Aerobic Exercise & Resistance Training: - Gives group verbal and written instruction on the various components of exercise. Focuses on aerobic and resistive training programs and the benefits of this training and how to safely progress through these programs..   Education: Exercise & Equipment Safety: - Individual verbal instruction and demonstration of equipment use and safety with use of the equipment.   Pulmonary Rehab from 09/08/2019 in [facility] Cardiac and Pulmonary Rehab  Date 09/08/19  Educator AS  Instruction Review Code 1- Verbalizes Understanding      Education: Exercise Physiology & General Exercise  Guidelines: - Group verbal and written instruction with models to review the exercise physiology of the cardiovascular system and associated critical values. Provides general exercise guidelines with specific guidelines to those with heart or lung disease.    Pulmonary Rehab from 08/15/2017 in [facility] Cardiac and Pulmonary Rehab  Date 08/08/17  Educator [facility]  Instruction Review Code 1- Verbalizes Understanding      Education: Flexibility, Balance, Mind/Body Relaxation: Provides group verbal/written instruction on the benefits of flexibility and balance training, including mind/body  exercise modes such as yoga, pilates and tai chi.  Demonstration and skill practice provided.   Pulmonary Rehab from 08/15/2017 in [facility] Cardiac and Pulmonary Rehab  Date 06/13/17  Educator AS  Instruction Review Code 1- Verbalizes Understanding      Activity Barriers & Risk Stratification:  Activity Barriers & Cardiac Risk Stratification - 09/05/19 1018      Activity Barriers & Cardiac Risk Stratification   Activity Barriers Joint Problems;Back Problems   Knees and lower back. Knees has been receiving injections.          6 Minute Walk:  6 Minute Walk    Row Name 09/08/19 1242         6 Minute Walk   Distance 595 feet     Walk Time 4.1 minutes     # of Rest Breaks 1     MPH 1.65     METS 1.43     RPE 13     Perceived Dyspnea  1     VO2 Peak 5.01     Symptoms No     Resting HR 90 bpm     Resting BP 114/74     Resting Oxygen Saturation  97 %     Exercise Oxygen Saturation  during 6 min walk 80 %     Max Ex. HR 114 bpm     Max Ex. BP 136/84     2 Minute Post BP 126/74       Interval HR   1 Minute HR 103     2 Minute HR 106     3 Minute HR 104     4 Minute HR 103     5 Minute HR 105     6 Minute HR 114     2 Minute Post HR 108     Interval Heart Rate? Yes       Interval Oxygen   Interval Oxygen? Yes     Baseline Oxygen Saturation % 97 %     1 Minute Oxygen Saturation % 84 %     1  Minute Liters of Oxygen 2 L     2 Minute Oxygen Saturation % 81 %     2 Minute Liters of Oxygen 2 L     3 Minute Oxygen Saturation % 80 %     3 Minute Liters of Oxygen 2 L     4 Minute Oxygen Saturation % 88 %     4 Minute Liters of Oxygen 2 L     5 Minute Oxygen Saturation % 87 %     5 Minute Liters of Oxygen 2 L     6 Minute Oxygen Saturation % 84 %     6 Minute Liters of Oxygen 2 L     2 Minute Post Oxygen Saturation % 89 %     2 Minute Post Liters of Oxygen 2 L           Oxygen Initial Assessment:  Oxygen Initial Assessment - 09/05/19 1019      Home Oxygen   Home Oxygen Device Home Concentrator;Portable Concentrator    Sleep Oxygen Prescription CPAP    Liters per minute 2    Home Exercise Oxygen Prescription Continuous    Liters per minute 2    Liters per minute 2   as needed   Compliance with Home Oxygen Use Yes      Intervention   Short Term Goals To learn and exhibit compliance with  exercise, home and travel O2 prescription;To learn and understand importance of monitoring SPO2 with pulse oximeter and demonstrate accurate use of the pulse oximeter.;To learn and understand importance of maintaining oxygen saturations>88%;To learn and demonstrate proper pursed lip breathing techniques or other breathing techniques.;To learn and demonstrate proper use of respiratory medications    Long  Term Goals Exhibits compliance with exercise, home and travel O2 prescription;Verbalizes importance of monitoring SPO2 with pulse oximeter and return demonstration;Maintenance of O2 saturations>88%;Exhibits proper breathing techniques, such as pursed lip breathing or other method taught during program session;Compliance with respiratory medication;Demonstrates proper use of MDI's           Oxygen Re-Evaluation:  Oxygen Re-Evaluation    Row Name 09/10/19 1130 10/01/19 0944 10/27/19 0941         Program Oxygen Prescription   Program Oxygen Prescription Continuous;E-Tanks  Continuous;E-Tanks Continuous;E-Tanks     Liters per minute '4 2 2       ' Home Oxygen   Home Oxygen Device Home Concentrator;Portable Concentrator Home Concentrator;Portable Concentrator Home Concentrator;Portable Concentrator     Sleep Oxygen Prescription CPAP CPAP CPAP     Liters per minute '2 2 2     ' Home Exercise Oxygen Prescription Continuous Continuous Continuous     Liters per minute '2 2 2     ' Home at Rest Exercise Oxygen Prescription Continuous Continuous Continuous     Liters per minute '2 2 2     ' Compliance with Home Oxygen Use Yes Yes Yes       Goals/Expected Outcomes   Short Term Goals To learn and exhibit compliance with exercise, home and travel O2 prescription;To learn and understand importance of monitoring SPO2 with pulse oximeter and demonstrate accurate use of the pulse oximeter.;To learn and understand importance of maintaining oxygen saturations>88%;To learn and demonstrate proper pursed lip breathing techniques or other breathing techniques.;To learn and demonstrate proper use of respiratory medications To learn and exhibit compliance with exercise, home and travel O2 prescription;To learn and understand importance of monitoring SPO2 with pulse oximeter and demonstrate accurate use of the pulse oximeter.;To learn and understand importance of maintaining oxygen saturations>88%;To learn and demonstrate proper pursed lip breathing techniques or other breathing techniques.;To learn and demonstrate proper use of respiratory medications To learn and exhibit compliance with exercise, home and travel O2 prescription;To learn and understand importance of monitoring SPO2 with pulse oximeter and demonstrate accurate use of the pulse oximeter.;To learn and understand importance of maintaining oxygen saturations>88%;To learn and demonstrate proper pursed lip breathing techniques or other breathing techniques.;To learn and demonstrate proper use of respiratory medications     Long  Term Goals  Exhibits compliance with exercise, home and travel O2 prescription;Verbalizes importance of monitoring SPO2 with pulse oximeter and return demonstration;Maintenance of O2 saturations>88%;Exhibits proper breathing techniques, such as pursed lip breathing or other method taught during program session;Compliance with respiratory medication;Demonstrates proper use of MDI's Exhibits compliance with exercise, home and travel O2 prescription;Verbalizes importance of monitoring SPO2 with pulse oximeter and return demonstration;Maintenance of O2 saturations>88%;Exhibits proper breathing techniques, such as pursed lip breathing or other method taught during program session;Compliance with respiratory medication;Demonstrates proper use of MDI's Exhibits compliance with exercise, home and travel O2 prescription;Verbalizes importance of monitoring SPO2 with pulse oximeter and return demonstration;Maintenance of O2 saturations>88%;Exhibits proper breathing techniques, such as pursed lip breathing or other method taught during program session;Compliance with respiratory medication;Demonstrates proper use of MDI's     Comments Reviewed PLB technique with pt.  Talked about how it  works and it's importance in maintaining their exercise saturations. Mario [name] is doing well in rehab.  He has improved his compliance for wearing his CPAP.  He does not always use his oxygen at home when he is resting as his concentrator is upstairs.  When he is active, he will wear it.  He does use his pulse oximeter at home to keep and eye on his oxygen level.  He notices differences as too if he is hot or cold as the hot air makes it harder to breathe.  He is not using the PLB as much at home, but trying to use it more and uses it most when trying to recover his breathing. Mario [name] states he wears his CPAP but not all the time.  He can sit at the table without oxygen but uses it when he needs it if up and moving.  He has a pulse ox to check O2 at home.      Goals/Expected Outcomes Short: Become more profiecient at using PLB.   Long: Become independent at using PLB. Short: Use PLB more  Long: Continued compliance. Short:  RT discussed importance of using CPAP Long:  compliance with all O2 prescriptions            Oxygen Discharge (Final Oxygen Re-Evaluation):  Oxygen Re-Evaluation - 10/27/19 0941      Program Oxygen Prescription   Program Oxygen Prescription Continuous;E-Tanks    Liters per minute 2      Home Oxygen   Home Oxygen Device Home Concentrator;Portable Concentrator    Sleep Oxygen Prescription CPAP    Liters per minute 2    Home Exercise Oxygen Prescription Continuous    Liters per minute 2    Home at Rest Exercise Oxygen Prescription Continuous    Liters per minute 2    Compliance with Home Oxygen Use Yes      Goals/Expected Outcomes   Short Term Goals To learn and exhibit compliance with exercise, home and travel O2 prescription;To learn and understand importance of monitoring SPO2 with pulse oximeter and demonstrate accurate use of the pulse oximeter.;To learn and understand importance of maintaining oxygen saturations>88%;To learn and demonstrate proper pursed lip breathing techniques or other breathing techniques.;To learn and demonstrate proper use of respiratory medications    Long  Term Goals Exhibits compliance with exercise, home and travel O2 prescription;Verbalizes importance of monitoring SPO2 with pulse oximeter and return demonstration;Maintenance of O2 saturations>88%;Exhibits proper breathing techniques, such as pursed lip breathing or other method taught during program session;Compliance with respiratory medication;Demonstrates proper use of MDI's    Comments Mario [name] states he wears his CPAP but not all the time.  He can sit at the table without oxygen but uses it when he needs it if up and moving.  He has a pulse ox to check O2 at home.    Goals/Expected Outcomes Short:  RT discussed importance of using CPAP Long:   compliance with all O2 prescriptions           Initial Exercise Prescription:  Initial Exercise Prescription - 09/08/19 1200      Date of Initial Exercise RX and Referring Provider   Date 09/08/19    Referring Provider [name]      Treadmill   MPH 1.5    Grade 0    Minutes 15      Recumbant Bike   Level 1    RPM 60    Minutes 15    METs 1.5  NuStep   Level 1    SPM 80    Minutes 15    METs 1.5      REL-XR   Level 1    Speed 50    Minutes 15    METs 1.5      Prescription Details   Frequency (times per week) 3    Duration Progress to 30 minutes of continuous aerobic without signs/symptoms of physical distress      Intensity   THRR 40-80% of Max Heartrate 113-136    Ratings of Perceived Exertion 11-15    Perceived Dyspnea 0-4      Resistance Training   Training Prescription Yes    Weight 3 lb    Reps 10-15           Perform Capillary Blood Glucose checks as needed.  Exercise Prescription Changes:  Exercise Prescription Changes    Row Name 09/08/19 1200 09/15/19 1400 09/30/19 1500 10/15/19 1400 10/29/19 0900     Response to Exercise   Blood Pressure (Admit) 114/74 140/80 120/80 130/82 114/68   Blood Pressure (Exercise) 136/84 160/82 142/60 122/82 142/80   Blood Pressure (Exit) 126/74 150/85 112/68 122/70 118/74   Heart Rate (Admit) 90 bpm 89 bpm 82 bpm 90 bpm 91 bpm   Heart Rate (Exercise) 114 bpm 112 bpm 97 bpm 109 bpm 103 bpm   Heart Rate (Exit) 108 bpm 91 bpm 97 bpm 87 bpm 96 bpm   Oxygen Saturation (Admit) 97 % 91 % 98 % 95 % 97 %   Oxygen Saturation (Exercise) 80 % 90 % 96 % 92 % 95 %   Oxygen Saturation (Exit) 89 % 96 % 99 % 97 % 98 %   Rating of Perceived Exertion (Exercise) '13 11 15 15 11   ' Perceived Dyspnea (Exercise) 1 -- '2 1 1   ' Symptoms none none none none none   Duration -- Continue with 30 min of aerobic exercise without signs/symptoms of physical distress. Continue with 30 min of aerobic exercise without signs/symptoms of  physical distress. Continue with 30 min of aerobic exercise without signs/symptoms of physical distress. Continue with 30 min of aerobic exercise without signs/symptoms of physical distress.   Intensity -- THRR unchanged THRR unchanged THRR unchanged THRR unchanged     Progression   Progression -- Continue to progress workloads to maintain intensity without signs/symptoms of physical distress. Continue to progress workloads to maintain intensity without signs/symptoms of physical distress. Continue to progress workloads to maintain intensity without signs/symptoms of physical distress. Continue to progress workloads to maintain intensity without signs/symptoms of physical distress.   Average METs -- 3.6 2.7 2.87 2.25     Resistance Training   Training Prescription -- Yes Yes Yes Yes   Weight -- 3 lb 3 lb 5 lb 5 lb   Reps -- 10-15 10-15 10-15 10-15     Interval Training   Interval Training -- No No No No     Oxygen   Oxygen -- -- -- Continuous Continuous   Liters -- -- -- 3 3     Treadmill   MPH -- -- -- 1 1   Grade -- -- -- 0.5 0.5   Minutes -- -- -- 15 15   METs -- -- -- 1.83 1.83     Recumbant Bike   Level -- -- 1 5 --   RPM -- -- 60 -- --   Watts -- -- -- 30 --   Minutes -- -- 15 15 --  METs -- -- 2.1 2.1 --     NuStep   Level -- '2 4 4 ' --   SPM -- -- 80 -- --   Minutes -- '15 15 15 ' --   METs -- 3.1 3.3 2.7 --     REL-XR   Level -- 1 -- -- --   Minutes -- 15 -- -- --   METs -- 4.1 -- -- --     T5 Nustep   Level -- -- -- 1 1   SPM -- -- -- -- 80   Minutes -- -- -- 15 15   METs -- -- -- 1.9 1.9     Home Exercise Plan   Plans to continue exercise at -- -- -- Home (comment)  walking Home (comment)  walking   Frequency -- -- -- Add 2 additional days to program exercise sessions. Add 2 additional days to program exercise sessions.   Initial Home Exercises Provided -- -- -- 10/20/19 10/20/19   Row Name 11/10/19 1300             Response to Exercise   Blood  Pressure (Admit) 142/78       Blood Pressure (Exercise) 142/76       Blood Pressure (Exit) 122/80       Heart Rate (Admit) 98 bpm       Heart Rate (Exercise) 103 bpm       Heart Rate (Exit) 101 bpm       Oxygen Saturation (Admit) 92 %       Oxygen Saturation (Exercise) 95 %       Oxygen Saturation (Exit) 98 %       Rating of Perceived Exertion (Exercise) 13       Perceived Dyspnea (Exercise) 0       Symptoms none       Duration Continue with 30 min of aerobic exercise without signs/symptoms of physical distress.       Intensity THRR unchanged         Progression   Progression Continue to progress workloads to maintain intensity without signs/symptoms of physical distress.       Average METs 2.1         Resistance Training   Training Prescription Yes       Weight 6 lb       Reps 10-15         Interval Training   Interval Training No         Oxygen   Oxygen Continuous       Liters 3         Recumbant Bike   Level 5       Watts 39       Minutes 15       METs 3.1         NuStep   Level 4       Minutes 15       METs 3         Arm Ergometer   Level 1       Minutes 15       METs 2.2         Home Exercise Plan   Plans to continue exercise at Home (comment)  walking       Frequency Add 2 additional days to program exercise sessions.       Initial Home Exercises Provided 10/20/19              Exercise  Comments:  Exercise Comments    Row Name 09/05/19 1049           Exercise Comments His knees may inhibit his exercise function. He is getting injections in his knees and the injections do help his pain. He still has pain "under my kneecap" He is hoping this exercise will help with the knee pain. We did talk about PT for his knees. His physician was going to order PT.              Exercise Goals and Review:  Exercise Goals    Row Name 09/08/19 1300             Exercise Goals   Increase Physical Activity Yes       Intervention Provide advice, education,  support and counseling about physical activity/exercise needs.;Develop an individualized exercise prescription for aerobic and resistive training based on initial evaluation findings, risk stratification, comorbidities and participant's personal goals.       Expected Outcomes Short Term: Attend rehab on a regular basis to increase amount of physical activity.;Long Term: Add in home exercise to make exercise part of routine and to increase amount of physical activity.;Long Term: Exercising regularly at least 3-5 days a week.       Increase Strength and Stamina Yes       Intervention Provide advice, education, support and counseling about physical activity/exercise needs.;Develop an individualized exercise prescription for aerobic and resistive training based on initial evaluation findings, risk stratification, comorbidities and participant's personal goals.       Expected Outcomes Short Term: Increase workloads from initial exercise prescription for resistance, speed, and METs.;Short Term: Perform resistance training exercises routinely during rehab and add in resistance training at home;Long Term: Improve cardiorespiratory fitness, muscular endurance and strength as measured by increased METs and functional capacity (6MWT)       Able to understand and use rate of perceived exertion (RPE) scale Yes       Intervention Provide education and explanation on how to use RPE scale       Expected Outcomes Short Term: Able to use RPE daily in rehab to express subjective intensity level;Long Term:  Able to use RPE to guide intensity level when exercising independently       Able to understand and use Dyspnea scale Yes       Intervention Provide education and explanation on how to use Dyspnea scale       Expected Outcomes Short Term: Able to use Dyspnea scale daily in rehab to express subjective sense of shortness of breath during exertion;Long Term: Able to use Dyspnea scale to guide intensity level when exercising  independently       Knowledge and understanding of Target Heart Rate Range (THRR) Yes       Intervention Provide education and explanation of THRR including how the numbers were predicted and where they are located for reference       Expected Outcomes Short Term: Able to state/look up THRR;Short Term: Able to use daily as guideline for intensity in rehab;Long Term: Able to use THRR to govern intensity when exercising independently       Able to check pulse independently Yes       Intervention Provide education and demonstration on how to check pulse in carotid and radial arteries.;Review the importance of being able to check your own pulse for safety during independent exercise       Expected Outcomes Short Term: Able to explain why pulse checking  is important during independent exercise;Long Term: Able to check pulse independently and accurately       Understanding of Exercise Prescription Yes       Intervention Provide education, explanation, and written materials on patient's individual exercise prescription       Expected Outcomes Short Term: Able to explain program exercise prescription;Long Term: Able to explain home exercise prescription to exercise independently              Exercise Goals Re-Evaluation :  Exercise Goals Re-Evaluation    Row Name 09/10/19 1129 09/15/19 1352 09/30/19 1525 10/01/19 0950 10/15/19 1415     Exercise Goal Re-Evaluation   Exercise Goals Review Increase Physical Activity;Able to understand and use rate of perceived exertion (RPE) scale;Knowledge and understanding of Target Heart Rate Range (THRR);Understanding of Exercise Prescription;Increase Strength and Stamina;Able to check pulse independently;Able to understand and use Dyspnea scale Increase Physical Activity;Increase Strength and Stamina;Understanding of Exercise Prescription Increase Physical Activity;Increase Strength and Stamina;Understanding of Exercise Prescription Increase Physical Activity;Increase  Strength and Stamina;Understanding of Exercise Prescription Increase Physical Activity;Increase Strength and Stamina;Understanding of Exercise Prescription   Comments Reviewed RPE and dyspnea scales, THR and program prescription with pt today.  Pt voiced understanding and was given a copy of goals to take home. Mario [name] is off to a good start in rehab.   He has completed his first three full days of exercise and blood sugars have been good pre and post.  He is still hesistant on the treadmill, but we will continue to work with him through it.  We will continue to monitor his progress. Mario [name] has increased workloads on NS and XR.  Staff will continue to work with him on walking. Mario [name] is off to a good start in rehab.  He works hard in class and has started to use his recumbent bike at home while watching TV.  He will do it for 54mn at home.  We talked about adding in more time at home.  Updated home exercise with pt today.  Pt plans to walk and use bike at home for exercise.  Reviewed THR, pulse, RPE, sign and symptoms, pulse oximetery and when to call 911 or MD.  Also discussed weather considerations and indoor options.  Pt voiced understanding. LNydia Boutonis doing well in rehab.  He is up to level 5 on the recumbent bike.  We will continue to monitor his progress.   Expected Outcomes Short: Use RPE daily to regulate intensity. Long: Follow program prescription in THR. Short: Talk about walking to and from rehab.  Long: Continue to follow program prescription and adhere to good attendance Short: work on walking Long:  increase overall MET level and stamina Short: Start to increase exercise time at home to 30 min Long: Continue to improve stamina. Short: Increase T5 workload Long: Continue to improve stamina.   RGarfieldName 10/29/19 0957 11/10/19 1302           Exercise Goal Re-Evaluation   Exercise Goals Review Increase Physical Activity;Increase Strength and Stamina;Understanding of Exercise Prescription Increase Physical  Activity;Increase Strength and Stamina;Understanding of Exercise Prescription      Comments LNydia Boutonhas tried the TM at 1.0 mph/.5 incline.  Staff will encourage him to continue to work on TM. LNydia Boutonhas been doing well in rehab.  He prefers not to be on the treadmill due to his knee.  He is up to level 5 on the bike.  We will continue to monitor his progress.  Expected Outcomes Short:  build stamina on TM Long: improve overall MET level Short: Ensure he is maintainin at least 80 spm on steppers  Long: Continue to improve stamina.             Discharge Exercise Prescription (Final Exercise Prescription Changes):  Exercise Prescription Changes - 11/10/19 1300      Response to Exercise   Blood Pressure (Admit) 142/78    Blood Pressure (Exercise) 142/76    Blood Pressure (Exit) 122/80    Heart Rate (Admit) 98 bpm    Heart Rate (Exercise) 103 bpm    Heart Rate (Exit) 101 bpm    Oxygen Saturation (Admit) 92 %    Oxygen Saturation (Exercise) 95 %    Oxygen Saturation (Exit) 98 %    Rating of Perceived Exertion (Exercise) 13    Perceived Dyspnea (Exercise) 0    Symptoms none    Duration Continue with 30 min of aerobic exercise without signs/symptoms of physical distress.    Intensity THRR unchanged      Progression   Progression Continue to progress workloads to maintain intensity without signs/symptoms of physical distress.    Average METs 2.1      Resistance Training   Training Prescription Yes    Weight 6 lb    Reps 10-15      Interval Training   Interval Training No      Oxygen   Oxygen Continuous    Liters 3      Recumbant Bike   Level 5    Watts 39    Minutes 15    METs 3.1      NuStep   Level 4    Minutes 15    METs 3      Arm Ergometer   Level 1    Minutes 15    METs 2.2      Home Exercise Plan   Plans to continue exercise at Home (comment)   walking   Frequency Add 2 additional days to program exercise sessions.    Initial Home Exercises Provided  10/20/19           Nutrition:  Target Goals: Understanding of nutrition guidelines, daily intake of sodium <1596m, cholesterol <2030m calories 30% from fat and 7% or less from saturated fats, daily to have 5 or more servings of fruits and vegetables.  Education: Controlling Sodium/Reading Food Labels -Group verbal and written material supporting the discussion of sodium use in heart healthy nutrition. Review and explanation with models, verbal and written materials for utilization of the food label.   [facility]  Date 07/30/17  Educator CR  Instruction Review Code 1- VeUnited States Steel Corporationnderstanding      Education: General Nutrition Guidelines/Fats and Fiber: -Group instruction provided by verbal, written material, models and posters to present the general guidelines for heart healthy nutrition. Gives an explanation and review of dietary fats and fiber.   [facility]  Date 07/23/17  Educator CR  Instruction Review Code 1- Verbalizes Understanding      Biometrics:  Pre Biometrics - 09/08/19 1301      Pre Biometrics   Height 5' 9.5" (1.765 m)    Weight 221 lb 4.8 oz (100.4 kg)    BMI (Calculated) 32.22            Nutrition Therapy Plan and Nutrition Goals:  Nutrition Therapy & Goals - 10/13/19 [id number]  Nutrition Therapy   Diet heart healthy, low Na, diabetic    Drug/Food Interactions Statins/Certain Fruits    Protein (specify units) 120g    Fiber 30 grams    Whole Grain Foods 3 servings    Saturated Fats 12 max. grams    Fruits and Vegetables 5 servings/day    Sodium 1.5 grams      Personal Nutrition Goals   Nutrition Goal ST: does not want to make short term goals at this time LT: wants to be able to walk without having assistance, now on 3L of O2 when exercising and 2L when not active; SOB affects activities of daily living  - needs to take breaks.     Comments 5.7 most recent A1C. B: Toast (potato bread - jelly and butter) and coffee - starbucks coffee (latte). S: bananas and other fruit D: spaghetti, chicken, sweet potatos, greens, and other vegetables; no salt, pt doesn't know what girlfriend cooks with S: chocolate pretzles, ice cream (gets one that has less sugar)  Drinks water all the time, during and in between meals. Lower appetite, sometimes doesn't eat at all. Discussed heart healthy eating and pulmonary MNT. Pt A1c 5.6 - eats many "naked" and simple CHOs, likely low A1C due to lower food consumption. Pt reports not eating much and sometimes does not eat at all. Pt also reports weight loss.      Intervention Plan   Intervention Prescribe, educate and counsel regarding individualized specific dietary modifications aiming towards targeted core components such as weight, hypertension, lipid management, diabetes, heart failure and other comorbidities.;Nutrition handout(s) given to patient.    Expected Outcomes Short Term Goal: Understand basic principles of dietary content, such as calories, fat, sodium, cholesterol and nutrients.;Short Term Goal: A plan has been developed with personal nutrition goals set during dietitian appointment.;Long Term Goal: Adherence to prescribed nutrition plan.           Nutrition Assessments:  Nutrition Assessments - 09/08/19 1303      MEDFICTS Scores   Pre Score 40           MEDIFICTS Score Key:          ?70 Need to make dietary changes          40-70 Heart Healthy Diet         ? 40 Therapeutic Level Cholesterol Diet  Nutrition Goals Re-Evaluation:  Nutrition Goals Re-Evaluation    [place] Name 10/27/19 0939             Goals   Nutrition Goal ST: Mario [name] is reducing portion sizes to lose weight Long:  continue to exercise and eat smaller protions              Nutrition Goals Discharge (Final Nutrition Goals Re-Evaluation):  Nutrition Goals Re-Evaluation - 10/27/19 0939      Goals    Nutrition Goal ST: Mario [name] is reducing portion sizes to lose weight Long:  continue to exercise and eat smaller protions           Psychosocial: Target Goals: Acknowledge presence or absence of significant depression and/or stress, maximize coping skills, provide positive support system. Participant is able to verbalize types and ability to use techniques and skills needed for reducing stress and depression.   Education: Depression - Provides group verbal and written instruction on the correlation between heart/lung disease and depressed mood, treatment options, and the stigmas associated with seeking treatment.   Education: Sleep Hygiene -Provides group verbal and written instruction about how  sleep can affect your health.  Define sleep hygiene, discuss sleep cycles and impact of sleep habits. Review good sleep hygiene tips.    Pulmonary Rehab from 08/15/2017 in [facility] Cardiac and Pulmonary Rehab  Date 08/15/17  Educator [facility]  Instruction Review Code 1- Verbalizes Understanding      Education: Stress and Anxiety: - Provides group verbal and written instruction about the health risks of elevated stress and causes of high stress.  Discuss the correlation between heart/lung disease and anxiety and treatment options. Review healthy ways to manage with stress and anxiety.   Pulmonary Rehab from 08/15/2017 in [facility] Cardiac and Pulmonary Rehab  Date 07/04/17  Educator [facility]  Instruction Review Code 1- Verbalizes Understanding      Initial Review & Psychosocial Screening:  Initial Psych Review & Screening - 09/05/19 1028      Initial Review   Current issues with Current Stress Concerns    Source of Stress Concerns Unable to perform yard/household activities;Unable to participate in former interests or hobbies;Chronic Illness    Comments COPD is well controlled, was told his lung tests are worse.  his knees prevent him from participating with daily chores      [place]? Yes   Girlfriend Tonette Bihari, DAra's sister in [place],  [place] in [place] and [place] Girlfriend Dara, Dara's sister in [place],  [place] in [place] and [place]      Barriers   Psychosocial barriers to participate in program There are no identifiable barriers or psychosocial needs.;The patient should benefit from training in stress management and relaxation.      Screening Interventions   Interventions Encouraged to exercise;To provide support and resources with identified psychosocial needs;Provide feedback about the scores to participant    Expected Outcomes Short Term goal: Utilizing psychosocial counselor, staff and physician to assist with identification of specific Stressors or current issues interfering with healing process. Setting desired goal for each stressor or current issue identified.;Long Term Goal: Stressors or current issues are controlled or eliminated.;Short Term goal: Identification and review with participant of any Quality of Life or Depression concerns found by scoring the questionnaire.;Long Term goal: The participant improves quality of Life and PHQ9 Scores as seen by post scores and/or verbalization of changes           Quality of Life Scores:  Scores of 19 and below usually indicate a poorer quality of life in these areas.  A difference of  2-3 points is a clinically meaningful difference.  A difference of 2-3 points in the total score of the Quality of Life Index has been associated with significant improvement in overall quality of life, self-image, physical symptoms, and general health in studies assessing change in quality of life.  PHQ-9: Recent Review Flowsheet Data    Depression screen [facility] 2/9 10/22/2019 10/01/2019 09/08/2019 06/25/2019 08/15/2017   Decreased Interest '1 2 3  ' 0 1   Down, Depressed, Hopeless 0 1 2 0 0   PHQ - 2 Score '1 3 5 ' 0 1   Altered sleeping 0 1 3  - 0   Tired, decreased energy '2 3 3  ' - 2   Change in appetite 0 0 0 - 0   Feeling bad or  failure about yourself  0 0 0 - 0   Trouble concentrating '1 1 1 ' - 0   Moving slowly or fidgety/restless 0 0 1 - 0   Suicidal thoughts 0 0 0 - 0  PHQ-9 Score '4 8 13 ' - 3   Difficult doing work/chores Somewhat difficult Not difficult at all Somewhat difficult - Not difficult at all     Interpretation of Total Score  Total Score Depression Severity:  1-4 = Minimal depression, 5-9 = Mild depression, 10-14 = Moderate depression, 15-19 = Moderately severe depression, 20-27 = Severe depression   Psychosocial Evaluation and Intervention:  Psychosocial Evaluation - 09/05/19 1041      Psychosocial Evaluation & Interventions   Interventions Encouraged to exercise with the program and follow exercise prescription    Comments Mario [name] has no barriers to returning to the program after over a year. He stated that he has not been doing his usual exercise regimen due to the [place] PHE. He is glad to have the referral so he can get back to a routine of exercise . He lives with his girlfrient Tonette Bihari (calls her Rose) and 2 dogs. He has a great support system with Rose. Other support is Rose's sister in [place], who is a Marine scientist and his children that live in [place] and [place]. His knees may inhibit his exercise function. He is getting injections in his knees and the injections do help his pain. He still has pain "under my kneecap" He is hoping this exercise will help with the knee pain. We did talk about PT for his knees. His physician was going to order PT. Mario [name] should do well with the program    Expected Outcomes STG: Mario [name] will benefit from consistent exercise to achieve his stated goals.  LTG Mario [name] will after discharge be able to continue with his exercise to maintain his progress    Continue Psychosocial Services  Follow up required by staff           Psychosocial Re-Evaluation:  Psychosocial Re-Evaluation    [place] Name 10/01/19 [phone] 10/22/19 1041 10/27/19 0938         Psychosocial Re-Evaluation   Current issues with  Current Depression;Current Stress Concerns;Current Sleep Concerns Current Depression;Current Stress Concerns;Current Sleep Concerns Current Depression;Current Stress Concerns;Current Sleep Concerns     Comments Mario [name] is doing well in rehab. His PHQ score has improved from 13 to 8!  He is feeling better getting back into an exercise routine. He is also feleing like his attitude has gotten better.  He is now on a mission to lose weight.  He is using his CPAP again and helping him sleep better. Mario [name] is doing well in rehab. His PHQ score has improved from 13 to 8 and now 8 to 4!  He is using his CPAP again and helping him sleep better. Mario [name] reports getting support from his sisters and that right now he isn't doing any therapy. Mario [name] reports that he feels well and doesn't feel depressed. --     Expected Outcomes Short: Continue with new attitude. Long: Continue to stay positive. Short: Continue with new attitude. Long: Continue to stay positive. --     Interventions Encouraged to attend Pulmonary Rehabilitation for the exercise;Stress management education Encouraged to attend Pulmonary Rehabilitation for the exercise;Stress management education --     Continue Psychosocial Services  Follow up required by staff Follow up required by staff --     Comments -- COPD is well controlled, was told his lung tests are worse.  his knees prevent him from participating with daily chores --       Initial Review   Source of Stress Concerns -- Unable to perform yard/household activities;Unable to participate in former interests  or hobbies;Chronic Illness --            Psychosocial Discharge (Final Psychosocial Re-Evaluation):  Psychosocial Re-Evaluation - 10/27/19 0938      Psychosocial Re-Evaluation   Current issues with Current Depression;Current Stress Concerns;Current Sleep Concerns           Education: Education Goals: Education classes will be provided on a weekly basis, covering required topics. Participant  will state understanding/return demonstration of topics presented.  Learning Barriers/Preferences:  Learning Barriers/Preferences - 09/05/19 1034      Learning Barriers/Preferences   Learning Barriers None    Learning Preferences None           General Pulmonary Education Topics:  Infection Prevention: - Provides verbal and written material to individual with discussion of infection control including proper hand washing and proper equipment cleaning during exercise session.   Pulmonary Rehab from 09/08/2019 in [facility] Cardiac and Pulmonary Rehab  Date 09/08/19  Educator AS  Instruction Review Code 1- Verbalizes Understanding      Falls Prevention: - Provides verbal and written material to individual with discussion of falls prevention and safety.   Pulmonary Rehab from 09/08/2019 in [facility] Cardiac and Pulmonary Rehab  Date 09/08/19  Educator AS  Instruction Review Code 1- Verbalizes Understanding      Chronic Lung Diseases: - Group verbal and written instruction to review updates, respiratory medications, advancements in procedures and treatments. Discuss use of supplemental oxygen including available portable oxygen systems, continuous and intermittent flow rates, concentrators, personal use and safety guidelines. Review proper use of inhaler and spacers. Provide informative websites for self-education.    Pulmonary Rehab from 08/15/2017 in [facility] Cardiac and Pulmonary Rehab  Date 07/25/17  Educator [facility]  Instruction Review Code 1- Verbalizes Understanding      Energy Conservation: - Provide group verbal and written instruction for methods to conserve energy, plan and organize activities. Instruct on pacing techniques, use of adaptive equipment and posture/positioning to relieve shortness of breath.   Pulmonary Rehab from 08/15/2017 in [facility] Cardiac and Pulmonary Rehab  Date 06/27/17  Educator [facility]  Instruction Review Code 1- Verbalizes Understanding      Triggers and  Exacerbations: - Group verbal and written instruction to review types of environmental triggers and ways to prevent exacerbations. Discuss weather changes, air quality and the benefits of nasal washing. Review warning signs and symptoms to help prevent infections. Discuss techniques for effective airway clearance, coughing, and vibrations.   Pulmonary Rehab from 05/03/2016 in [facility] Cardiac and Pulmonary Rehab  Date 04/26/16  Educator LB  Instruction Review Code (retired) 2- meets goals/outcomes      AED/CPR: - Group verbal and written instruction with the use of models to demonstrate the basic use of the AED with the basic ABC's of resuscitation.   Pulmonary Rehab from 08/15/2017 in [facility] Cardiac and Pulmonary Rehab  Date 06/29/17  Educator [facility]  Instruction Review Code 1- Actuary and Physiology of the Lungs: - Group verbal and written instruction with the use of models to provide basic lung anatomy and physiology related to function, structure and complications of lung disease.   Pulmonary Rehab from 08/15/2017 in [facility] Cardiac and Pulmonary Rehab  Date 07/11/17  Educator [facility]  Instruction Review Code 1- Verbalizes Understanding      Anatomy & Physiology of the Heart: - Group verbal and written instruction and models provide basic cardiac anatomy and physiology, with the coronary electrical and arterial systems. Review of Valvular disease  and Heart Failure   Cardiac Medications: - Group verbal and written instruction to review commonly prescribed medications for heart disease. Reviews the medication, class of the drug, and side effects.   Pulmonary Rehab from 08/15/2017 in [facility] Cardiac and Pulmonary Rehab  Date 06/15/17  Educator [facility]  Instruction Review Code 1- Verbalizes Understanding      Other: -Provides group and verbal instruction on various topics (see comments)   Pulmonary Rehab from 08/15/2017 in [facility] Cardiac and Pulmonary Rehab  Date 05/23/17   Educator [facility]  Instruction Review Code 1- Verbalizes Understanding  [SLEEP]      Knowledge Questionnaire Score:  Knowledge Questionnaire Score - 09/08/19 1303      Knowledge Questionnaire Score   Pre Score 16/18            Core Components/Risk Factors/Patient Goals at Admission:  Personal Goals and Risk Factors at Admission - 09/08/19 1314      Core Components/Risk Factors/Patient Goals on Admission    Weight Management Weight Loss;Yes    Intervention Weight Management: Develop a combined nutrition and exercise program designed to reach desired caloric intake, while maintaining appropriate intake of nutrient and fiber, sodium and fats, and appropriate energy expenditure required for the weight goal.;Weight Management: Provide education and appropriate resources to help participant work on and attain dietary goals.;Weight Management/Obesity: Establish reasonable short term and long term weight goals.;Obesity: Provide education and appropriate resources to help participant work on and attain dietary goals.    Admit Weight 221 lb 4.8 oz (100.4 kg)    Goal Weight: Short Term 215 lb (97.5 kg)    Goal Weight: Long Term 200 lb (90.7 kg)    Expected Outcomes Short Term: Continue to assess and modify interventions until short term weight is achieved;Long Term: Adherence to nutrition and physical activity/exercise program aimed toward attainment of established weight goal;Weight Maintenance: Understanding of the daily nutrition guidelines, which includes 25-35% calories from fat, 7% or less cal from saturated fats, less than 280m cholesterol, less than 1.5gm of sodium, & 5 or more servings of fruits and vegetables daily;Weight Loss: Understanding of general recommendations for a balanced deficit meal plan, which promotes 1-2 lb weight loss per week and includes a negative energy balance of [id number] kcal/d    Diabetes Yes    Intervention Provide education about signs/symptoms and action to take for  hypo/hyperglycemia.;Provide education about proper nutrition, including hydration, and aerobic/resistive exercise prescription along with prescribed medications to achieve blood glucose in normal ranges: Fasting glucose 65-99 mg/dL    Expected Outcomes Short Term: Participant verbalizes understanding of the signs/symptoms and immediate care of hyper/hypoglycemia, proper foot care and importance of medication, aerobic/resistive exercise and nutrition plan for blood glucose control.;Long Term: Attainment of HbA1C < 7%.    Intervention Provide a combined exercise and nutrition program that is supplemented with education, support and counseling about heart failure. Directed toward relieving symptoms such as shortness of breath, decreased exercise tolerance, and extremity edema.    Expected Outcomes Improve functional capacity of life;Short term: Attendance in program 2-3 days a week with increased exercise capacity. Reported lower sodium intake. Reported increased fruit and vegetable intake. Reports medication compliance.;Short term: Daily weights obtained and reported for increase. Utilizing diuretic protocols set by physician.;Long term: Adoption of self-care skills and reduction of barriers for early signs and symptoms recognition and intervention leading to self-care maintenance.    Intervention Provide education on lifestyle modifcations including regular physical activity/exercise, weight management, moderate sodium restriction and increased consumption of  fresh fruit, vegetables, and low fat dairy, alcohol moderation, and smoking cessation.;Monitor prescription use compliance.    Expected Outcomes Short Term: Continued assessment and intervention until BP is < 140/43m HG in hypertensive participants. < 130/816mHG in hypertensive participants with diabetes, heart failure or chronic kidney disease.;Long Term: Maintenance of blood pressure at goal levels.           Education:Diabetes - Individual  verbal and written instruction to review signs/symptoms of diabetes, desired ranges of glucose level fasting, after meals and with exercise. Acknowledge that pre and post exercise glucose checks will be done for 3 sessions at entry of program.   [facility]  Date 04/04/16  Educator LB  Instruction Review Code (retired) 2- meets goals/outcomes      Education: Know Your Numbers and Risk Factors: -Group verbal and written instruction about important numbers in your health.  Discussion of what are risk factors and how they play a role in the disease process.  Review of Cholesterol, Blood Pressure, Diabetes, and BMI and the role they play in your overall health.   [facility]  Date 08/10/17  Educator MCGi Or NormanInstruction Review Code 1- Verbalizes Understanding      Core Components/Risk Factors/Patient Goals Review:   Goals and Risk Factor Review    Row Name 10/01/19 [id number]/28/21 0934           Core Components/Risk Factors/Patient Goals Review   Personal Goals Review Weight Management/Obesity;Improve shortness of breath with ADL's;Heart Failure;Hypertension Weight Management/Obesity;Improve shortness of breath with ADL's;Heart Failure;Hypertension      Review LaNydia Boutons doing well in rehab.  He is now on a new mission to lose weight and get under 200 lb.  His breathing is getting better and he does not need his oxygen at rest at home.  He denies heart failure symptoms currently and saw TiOtila Kluverecently and got a good report.  He is scheduled for an echo on June 16.  Blood pressures have been good and he does check them at home. Mario [name] wants to lose 15 more lb.  He is exercising here and cutting down postion sizes.  Mario [name] feels his SOB has improved since he has started to use better breathing techniques.  He talks to his Dr today about the echo.      Expected Outcomes Short: Continue to work on weight  loss Long: Continue to improve breathing using PLB. Short: continue to exercise and eat better Long: continue to practice breathing techniques             Core Components/Risk Factors/Patient Goals at Discharge (Final Review):   Goals and Risk Factor Review - 10/27/19 0934      Core Components/Risk Factors/Patient Goals Review   Personal Goals Review Weight Management/Obesity;Improve shortness of breath with ADL's;Heart Failure;Hypertension    Review Mario [name] wants to lose 15 more lb.  He is exercising here and cutting down postion sizes.  Mario [name] feels his SOB has improved since he has started to use better breathing techniques.  He talks to his Dr today about the echo.    Expected Outcomes Short: continue to exercise and eat better Long: continue to practice breathing techniques           ITP Comments:  ITP Comments    Row Name 09/05/19 1004 09/08/19 1312 09/10/19 1129 09/17/19 0533 10/15/19 0548   ITP Comments Virtual Orientation completed today. Exercise evaluation  scheduled for Monday 5/10 with gym orientation. Documentation for diagnosis can be found in [facility] 4/21. Completed 6MWT and gym orientation.  Initial ITP created and sent for review to Dr. Emily Filbert, Medical Director. First full day of exercise!  Patient was oriented to gym and equipment including functions, settings, policies, and procedures.  Patient's individual exercise prescription and treatment plan were reviewed.  All starting workloads were established based on the results of the 6 minute walk test done at initial orientation visit.  The plan for exercise progression was also introduced and progression will be customized based on patient's performance and goals. 30 Day review completed. ITP review done, changes made as directed,and approval shown by signature of  Scientist, research (life sciences).  New to program 30 Day review completed. Medical Director ITP review done, changes made as directed, and signed approval by Medical Director.    [place] Name 11/12/19 0631           ITP Comments 30 Day review completed. Medical Director ITP review done, changes made as directed, and signed approval by Medical Director.              Comments:

## 2019-11-19 ENCOUNTER — Other Ambulatory Visit: Payer: Self-pay

## 2019-11-19 ENCOUNTER — Encounter: Payer: Medicare Other | Admitting: *Deleted

## 2019-11-21 ENCOUNTER — Encounter: Payer: Medicare Other | Admitting: *Deleted

## 2019-11-21 ENCOUNTER — Other Ambulatory Visit: Payer: Self-pay

## 2019-11-21 DIAGNOSIS — J449 Chronic obstructive pulmonary disease, unspecified: Secondary | ICD-10-CM

## 2019-11-21 NOTE — Progress Notes (Signed)
Daily Session Note  Patient Details  Name: Mario [name] MRN: [id number] Date of Birth: 1946/01/07 Referring Provider:     Pulmonary Rehab from 09/08/2019 in [facility] Cardiac and Pulmonary Rehab  Referring Provider [name]      Encounter Date: 11/21/2019  Check In:  Session Check In - 11/21/19 1003      Check-In   Supervising physician immediately available to respond to emergencies See telemetry face sheet for immediately available ER MD    Location [facility]-Cardiac & Pulmonary Rehab    Staff Present Renita Papa, RN BSN;Jessica Luan Pulling, MA, RCEP, CCRP, CCET;Melissa Valmont RDN, LDN    Virtual Visit No    Medication changes reported     No    Fall or balance concerns reported    No    Warm-up and Cool-down Performed on first and last piece of equipment    Resistance Training Performed Yes    VAD Patient? No    PAD/SET Patient? No      Pain Assessment   Currently in Pain? No/denies              Social History   Tobacco Use  Smoking Status Former Smoker  . Packs/day: 0.50  . Years: 1.00  . Pack years: 0.50  . Types: Cigarettes  . Quit date: 05/01/1969  . Years since quitting: 50.5  Smokeless Tobacco Never Used  Tobacco Comment   Quit 1971    Goals Met:  Independence with exercise equipment Exercise tolerated well No report of cardiac concerns or symptoms Strength training completed today  Goals Unmet:  Not Applicable  Comments: Pt able to follow exercise prescription today without complaint.  Will continue to monitor for progression.    Dr. Emily Filbert is Medical Director for [place] and [name].

## 2019-11-24 ENCOUNTER — Encounter: Payer: Medicare Other | Admitting: *Deleted

## 2019-11-24 ENCOUNTER — Other Ambulatory Visit: Payer: Self-pay

## 2019-11-24 DIAGNOSIS — J449 Chronic obstructive pulmonary disease, unspecified: Secondary | ICD-10-CM

## 2019-11-24 NOTE — Progress Notes (Signed)
Daily Session Note  Patient Details  Name: Mario [name] MRN: [id number] Date of Birth: [birth year] Referring Provider:     Pulmonary Rehab from 09/08/2019 in [facility] Cardiac and Pulmonary Rehab  Referring Provider [name]      Encounter Date: 11/24/2019  Check In:      Social History   Tobacco Use  Smoking Status Former Smoker  . Packs/day: 0.50  . Years: 1.00  . Pack years: 0.50  . Types: Cigarettes  . Quit date: 05/01/1969  . Years since quitting: 50.6  Smokeless Tobacco Never Used  Tobacco Comment   Quit 1971    Goals Met:  Proper associated with RPD/PD & O2 Sat Independence with exercise equipment Exercise tolerated well No report of cardiac concerns or symptoms  Goals Unmet:  Not Applicable  Comments: Pt able to follow exercise prescription today without complaint.  Will continue to monitor for progression.    Dr. Emily Filbert is Medical Director for [place] and [name].

## 2019-11-26 ENCOUNTER — Other Ambulatory Visit: Payer: Self-pay

## 2019-11-26 ENCOUNTER — Encounter: Payer: Medicare Other | Admitting: *Deleted

## 2019-11-26 DIAGNOSIS — J449 Chronic obstructive pulmonary disease, unspecified: Secondary | ICD-10-CM | POA: Diagnosis not present

## 2019-11-26 NOTE — Progress Notes (Signed)
Daily Session Note  Patient Details  Name: Treven Holtman MRN: [id number] Date of Birth: Mar 01, 1946 Referring Provider:     Pulmonary Rehab from 09/08/2019 in [facility] Cardiac and Pulmonary Rehab  Referring Provider [name]      Encounter Date: 11/26/2019  Check In:  Session Check In - 11/26/19 8325      Check-In   Supervising physician immediately available to respond to emergencies See telemetry face sheet for immediately available ER MD    Staff Present Renita Papa, RN BSN;Jessica Luan Pulling, MA, RCEP, CCRP, CCET;Melissa Los Cerrillos RDN, Rowe [place]avy, [place], ACSM CEP, Exercise Physiologist    Virtual Visit No    Medication changes reported     No    Fall or balance concerns reported    No    Warm-up and Cool-down Performed on first and last piece of equipment    Resistance Training Performed Yes    VAD Patient? No    PAD/SET Patient? No      Pain Assessment   Currently in Pain? No/denies              Social History   Tobacco Use  Smoking Status Former Smoker  . Packs/day: 0.50  . Years: 1.00  . Pack years: 0.50  . Types: Cigarettes  . Quit date: 05/01/1969  . Years since quitting: 50.6  Smokeless Tobacco Never Used  Tobacco Comment   Quit 1971    Goals Met:  Independence with exercise equipment Exercise tolerated well No report of cardiac concerns or symptoms Strength training completed today  Goals Unmet:  Not Applicable  Comments: Pt able to follow exercise prescription today without complaint.  Will continue to monitor for progression.    Dr. Emily Filbert is Medical Director for [place] and [name].

## 2019-11-28 ENCOUNTER — Other Ambulatory Visit: Payer: Self-pay

## 2019-11-28 ENCOUNTER — Encounter: Payer: Medicare Other | Admitting: *Deleted

## 2019-11-28 DIAGNOSIS — J449 Chronic obstructive pulmonary disease, unspecified: Secondary | ICD-10-CM | POA: Diagnosis not present

## 2019-11-28 NOTE — Progress Notes (Signed)
Daily Session Note  Patient Details  Name: Mario [name] MRN: [id number] Date of Birth: 08-Sep-1945 Referring Provider:     Pulmonary Rehab from 09/08/2019 in [facility] Cardiac and Pulmonary Rehab  Referring Provider [name]      Encounter Date: 11/28/2019  Check In:  Session Check In - 11/28/19 0926      Check-In   Supervising physician immediately available to respond to emergencies See telemetry face sheet for immediately available ER MD    Location [facility]-Cardiac & Pulmonary Rehab    Staff Present Renita Papa, RN BSN;Joseph [address] [place], [place], [place], CCRP, CCET    Virtual Visit No    Medication changes reported     No    Fall or balance concerns reported    No    Warm-up and Cool-down Performed on first and last piece of equipment    Resistance Training Performed Yes    VAD Patient? No    PAD/SET Patient? No      Pain Assessment   Currently in Pain? No/denies              Social History   Tobacco Use  Smoking Status Former Smoker  . Packs/day: 0.50  . Years: 1.00  . Pack years: 0.50  . Types: Cigarettes  . Quit date: 05/01/1969  . Years since quitting: 50.6  Smokeless Tobacco Never Used  Tobacco Comment   Quit 1971    Goals Met:  Independence with exercise equipment Exercise tolerated well No report of cardiac concerns or symptoms Strength training completed today  Goals Unmet:  Not Applicable  Comments: Pt able to follow exercise prescription today without complaint.  Will continue to monitor for progression.    Dr. Emily Filbert is Medical Director for [place] and [name].

## 2019-12-08 ENCOUNTER — Encounter: Payer: Medicare Other | Attending: Specialist

## 2019-12-08 DIAGNOSIS — G4730 Sleep apnea, unspecified: Secondary | ICD-10-CM | POA: Insufficient documentation

## 2019-12-08 DIAGNOSIS — J449 Chronic obstructive pulmonary disease, unspecified: Secondary | ICD-10-CM | POA: Insufficient documentation

## 2019-12-09 ENCOUNTER — Encounter: Payer: Self-pay | Admitting: *Deleted

## 2019-12-09 DIAGNOSIS — J449 Chronic obstructive pulmonary disease, unspecified: Secondary | ICD-10-CM

## 2019-12-10 ENCOUNTER — Encounter: Payer: Self-pay | Admitting: *Deleted

## 2019-12-10 DIAGNOSIS — J449 Chronic obstructive pulmonary disease, unspecified: Secondary | ICD-10-CM

## 2019-12-10 NOTE — Progress Notes (Signed)
Pulmonary Individual Treatment Plan  Patient Details  Name: Jaskirat Schwieger MRN: [id number] Date of Birth: 1945/12/24 Referring Provider:     Pulmonary Rehab from 09/08/2019 in [facility] Cardiac and Pulmonary Rehab  Referring Provider [name]      Initial Encounter Date:    Pulmonary Rehab from 09/08/2019 in [facility] Cardiac and Pulmonary Rehab  Date 09/08/19      Visit Diagnosis: Chronic obstructive pulmonary disease, unspecified COPD type ([place])  Patient's Home Medications on Admission:  Current Outpatient Medications:  .  albuterol (PROVENTIL HFA;VENTOLIN HFA) 108 (90 Base) MCG/ACT inhaler, Inhale 2 puffs into the lungs as needed., Disp: , Rfl:  .  APPLE CIDER VINEGAR PO, Take by mouth daily., Disp: , Rfl:  .  celecoxib (CELEBREX) 200 MG capsule, , Disp: , Rfl:  .  DULoxetine (CYMBALTA) 60 MG capsule, am, Disp: , Rfl:  .  edoxaban (SAVAYSA) 60 MG TABS tablet, Take 60 mg by mouth daily. cardiology, Disp: , Rfl:  .  Fluticasone-Umeclidin-Vilant (TRELEGY ELLIPTA IN), Inhale into the lungs daily., Disp: , Rfl:  .  furosemide (LASIX) 80 MG tablet, Take 1 tablet by mouth 2 (two) times daily. 80 mg AM, 120 mg PM, Disp: , Rfl:  .  gabapentin (NEURONTIN) 300 MG capsule, Take 800 mg by mouth 3 (three) times daily. , Disp: , Rfl:  .  gabapentin (NEURONTIN) 800 MG tablet, Take 800 mg by mouth 3 (three) times daily., Disp: , Rfl:  .  glucose blood (ONE TOUCH ULTRA TEST) test strip, Use as instructed FOR TESTING three times daily.  E11.9, Disp: , Rfl:  .  ipratropium-albuterol (DUONEB) 0.5-2.5 (3) MG/3ML SOLN, Inhale into the lungs., Disp: , Rfl:  .  metFORMIN (GLUCOPHAGE) 1000 MG tablet, Take 1 tablet by mouth 2 (two) times daily., Disp: , Rfl:  .  metFORMIN (GLUCOPHAGE) 500 MG tablet, , Disp: , Rfl:  .  OZEMPIC, 1 MG/DOSE, 2 MG/1.5ML SOPN, , Disp: , Rfl:  .  ROCKLATAN 0.02-0.005 % SOLN, Apply 1 drop to eye at bedtime., Disp: , Rfl:  .  sotalol (BETAPACE) 80 MG tablet, Take 1 tablet by mouth 2 (two)  times daily., Disp: , Rfl:  .  SOTALOL AF 80 MG TABS, Take by mouth 2 (two) times daily. Breakfast and dinner, Disp: , Rfl:  .  traMADol (ULTRAM) 50 MG tablet, Take by mouth 3 (three) times daily., Disp: , Rfl:  .  TURMERIC PO, Take by mouth daily., Disp: , Rfl:   Past Medical History: Past Medical History:  Diagnosis Date  . Arthritis    hands, knees  . Atrial fibrillation ([place])   . CHF (congestive heart failure) ([place])   . COPD (chronic obstructive pulmonary disease) ([place])   . Diabetes mellitus without complication ([place])   . Diabetic neuropathy ([place])   . Heart murmur    in past, mild 1969  . Hypertension    resolved after weight loss and more activity  . Neuromuscular disorder ([facility])    neuropathy  . Neuropathy   . Pulmonary embolism ([place])    approx 2014  . Shingles '99  . Sleep apnea    CPAP  . Wears dentures    full upper and lower    Tobacco Use: Social History   Tobacco Use  Smoking Status Former Smoker  . Packs/day: 0.50  . Years: 1.00  . Pack years: 0.50  . Types: Cigarettes  . Quit date: 05/01/1969  . Years since quitting: 50.6  Smokeless Tobacco Never Used  Tobacco Comment  Quit 1971    Labs: Recent Chemical engineer    Labs for ITP Cardiac and Pulmonary Rehab Latest Ref Rng & Units 02/15/2016   Hemoglobin A1c 4.8 - 5.6 % 6.8(H)       Pulmonary Assessment Scores:  Pulmonary Assessment Scores    Row Name 09/08/19 1302         ADL UCSD   SOB Score total 28     Rest 0     Walk 1     Stairs 3     Bath 0     Dress 0     Shop 1       CAT Score   CAT Score 7       mMRC Score   mMRC Score 2            UCSD: Self-administered rating of dyspnea associated with activities of daily living (ADLs) 6-point scale (0 = "not at all" to 5 = "maximal or unable to do because of breathlessness")  Scoring Scores range from 0 to 120.  Minimally important difference is 5 units  CAT: CAT can identify the health impairment of COPD patients and is  better correlated with disease progression.  CAT has a scoring range of zero to 40. The CAT score is classified into four groups of low (less than 10), medium (10 - 20), high (21-30) and very high (31-40) based on the impact level of disease on health status. A CAT score over 10 suggests significant symptoms.  A worsening CAT score could be explained by an exacerbation, poor medication adherence, poor inhaler technique, or progression of COPD or comorbid conditions.  CAT MCID is 2 points  mMRC: mMRC (Modified Medical Research Council) Dyspnea Scale is used to assess the degree of baseline functional disability in patients of respiratory disease due to dyspnea. No minimal important difference is established. A decrease in score of 1 point or greater is considered a positive change.   Pulmonary Function Assessment:   Exercise Target Goals: Exercise Program Goal: Individual exercise prescription set using results from initial 6 min walk test and THRR while considering  patient's activity barriers and safety.   Exercise Prescription Goal: Initial exercise prescription builds to 30-45 minutes a day of aerobic activity, 2-3 days per week.  Home exercise guidelines will be given to patient during program as part of exercise prescription that the participant will acknowledge.  Education: Aerobic Exercise & Resistance Training: - Gives group verbal and written instruction on the various components of exercise. Focuses on aerobic and resistive training programs and the benefits of this training and how to safely progress through these programs..   Education: Exercise & Equipment Safety: - Individual verbal instruction and demonstration of equipment use and safety with use of the equipment.   Pulmonary Rehab from 11/26/2019 in [facility] Cardiac and Pulmonary Rehab  Date 09/08/19  Educator AS  Instruction Review Code 1- Verbalizes Understanding      Education: Exercise Physiology & General Exercise  Guidelines: - Group verbal and written instruction with models to review the exercise physiology of the cardiovascular system and associated critical values. Provides general exercise guidelines with specific guidelines to those with heart or lung disease.    Pulmonary Rehab from 11/26/2019 in [facility] Cardiac and Pulmonary Rehab  Date 11/12/19  Educator [facility]  Instruction Review Code 1- Verbalizes Understanding      Education: Flexibility, Balance, Mind/Body Relaxation: Provides group verbal/written instruction on the benefits of flexibility and balance training, including mind/body  exercise modes such as yoga, pilates and tai chi.  Demonstration and skill practice provided.   Pulmonary Rehab from 08/15/2017 in [facility] Cardiac and Pulmonary Rehab  Date 06/13/17  Educator AS  Instruction Review Code 1- Verbalizes Understanding      Activity Barriers & Risk Stratification:  Activity Barriers & Cardiac Risk Stratification - 09/05/19 1018      Activity Barriers & Cardiac Risk Stratification   Activity Barriers Joint Problems;Back Problems   Knees and lower back. Knees has been receiving injections.          6 Minute Walk:  6 Minute Walk    Row Name 09/08/19 1242         6 Minute Walk   Distance 595 feet     Walk Time 4.1 minutes     # of Rest Breaks 1     MPH 1.65     METS 1.43     RPE 13     Perceived Dyspnea  1     VO2 Peak 5.01     Symptoms No     Resting HR 90 bpm     Resting BP 114/74     Resting Oxygen Saturation  97 %     Exercise Oxygen Saturation  during 6 min walk 80 %     Max Ex. HR 114 bpm     Max Ex. BP 136/84     2 Minute Post BP 126/74       Interval HR   1 Minute HR 103     2 Minute HR 106     3 Minute HR 104     4 Minute HR 103     5 Minute HR 105     6 Minute HR 114     2 Minute Post HR 108     Interval Heart Rate? Yes       Interval Oxygen   Interval Oxygen? Yes     Baseline Oxygen Saturation % 97 %     1 Minute Oxygen Saturation % 84 %     1  Minute Liters of Oxygen 2 L     2 Minute Oxygen Saturation % 81 %     2 Minute Liters of Oxygen 2 L     3 Minute Oxygen Saturation % 80 %     3 Minute Liters of Oxygen 2 L     4 Minute Oxygen Saturation % 88 %     4 Minute Liters of Oxygen 2 L     5 Minute Oxygen Saturation % 87 %     5 Minute Liters of Oxygen 2 L     6 Minute Oxygen Saturation % 84 %     6 Minute Liters of Oxygen 2 L     2 Minute Post Oxygen Saturation % 89 %     2 Minute Post Liters of Oxygen 2 L           Oxygen Initial Assessment:  Oxygen Initial Assessment - 09/05/19 1019      Home Oxygen   Home Oxygen Device Home Concentrator;Portable Concentrator    Sleep Oxygen Prescription CPAP    Liters per minute 2    Home Exercise Oxygen Prescription Continuous    Liters per minute 2    Liters per minute 2   as needed   Compliance with Home Oxygen Use Yes      Intervention   Short Term Goals To learn and exhibit compliance with  exercise, home and travel O2 prescription;To learn and understand importance of monitoring SPO2 with pulse oximeter and demonstrate accurate use of the pulse oximeter.;To learn and understand importance of maintaining oxygen saturations>88%;To learn and demonstrate proper pursed lip breathing techniques or other breathing techniques.;To learn and demonstrate proper use of respiratory medications    Long  Term Goals Exhibits compliance with exercise, home and travel O2 prescription;Verbalizes importance of monitoring SPO2 with pulse oximeter and return demonstration;Maintenance of O2 saturations>88%;Exhibits proper breathing techniques, such as pursed lip breathing or other method taught during program session;Compliance with respiratory medication;Demonstrates proper use of MDI's           Oxygen Re-Evaluation:  Oxygen Re-Evaluation    Row Name 09/10/19 1130 10/01/19 0944 10/27/19 0941 11/26/19 0934       Program Oxygen Prescription   Program Oxygen Prescription Continuous;E-Tanks  Continuous;E-Tanks Continuous;E-Tanks Continuous;E-Tanks    Liters per minute '4 2 2 2      ' Home Oxygen   Home Oxygen Device Home Concentrator;Portable Concentrator Home Concentrator;Portable Concentrator Home Concentrator;Portable Concentrator Home Concentrator;Portable Concentrator    Sleep Oxygen Prescription CPAP CPAP CPAP CPAP    Liters per minute '2 2 2 2    ' Home Exercise Oxygen Prescription Continuous Continuous Continuous Continuous    Liters per minute '2 2 2 2    ' Home at Rest Exercise Oxygen Prescription Continuous Continuous Continuous Continuous    Liters per minute '2 2 2 2    ' Compliance with Home Oxygen Use Yes Yes Yes No      Goals/Expected Outcomes   Short Term Goals To learn and exhibit compliance with exercise, home and travel O2 prescription;To learn and understand importance of monitoring SPO2 with pulse oximeter and demonstrate accurate use of the pulse oximeter.;To learn and understand importance of maintaining oxygen saturations>88%;To learn and demonstrate proper pursed lip breathing techniques or other breathing techniques.;To learn and demonstrate proper use of respiratory medications To learn and exhibit compliance with exercise, home and travel O2 prescription;To learn and understand importance of monitoring SPO2 with pulse oximeter and demonstrate accurate use of the pulse oximeter.;To learn and understand importance of maintaining oxygen saturations>88%;To learn and demonstrate proper pursed lip breathing techniques or other breathing techniques.;To learn and demonstrate proper use of respiratory medications To learn and exhibit compliance with exercise, home and travel O2 prescription;To learn and understand importance of monitoring SPO2 with pulse oximeter and demonstrate accurate use of the pulse oximeter.;To learn and understand importance of maintaining oxygen saturations>88%;To learn and demonstrate proper pursed lip breathing techniques or other breathing  techniques.;To learn and demonstrate proper use of respiratory medications To learn and exhibit compliance with exercise, home and travel O2 prescription;To learn and understand importance of monitoring SPO2 with pulse oximeter and demonstrate accurate use of the pulse oximeter.;To learn and understand importance of maintaining oxygen saturations>88%;To learn and demonstrate proper pursed lip breathing techniques or other breathing techniques.;To learn and demonstrate proper use of respiratory medications    Long  Term Goals Exhibits compliance with exercise, home and travel O2 prescription;Verbalizes importance of monitoring SPO2 with pulse oximeter and return demonstration;Maintenance of O2 saturations>88%;Exhibits proper breathing techniques, such as pursed lip breathing or other method taught during program session;Compliance with respiratory medication;Demonstrates proper use of MDI's Exhibits compliance with exercise, home and travel O2 prescription;Verbalizes importance of monitoring SPO2 with pulse oximeter and return demonstration;Maintenance of O2 saturations>88%;Exhibits proper breathing techniques, such as pursed lip breathing or other method taught during program session;Compliance with respiratory medication;Demonstrates proper use of MDI's  Exhibits compliance with exercise, home and travel O2 prescription;Verbalizes importance of monitoring SPO2 with pulse oximeter and return demonstration;Maintenance of O2 saturations>88%;Exhibits proper breathing techniques, such as pursed lip breathing or other method taught during program session;Compliance with respiratory medication;Demonstrates proper use of MDI's Exhibits compliance with exercise, home and travel O2 prescription;Verbalizes importance of monitoring SPO2 with pulse oximeter and return demonstration;Maintenance of O2 saturations>88%;Exhibits proper breathing techniques, such as pursed lip breathing or other method taught during program  session;Compliance with respiratory medication;Demonstrates proper use of MDI's    Comments Reviewed PLB technique with pt.  Talked about how it works and it's importance in maintaining their exercise saturations. Nydia Bouton is doing well in rehab.  He has improved his compliance for wearing his CPAP.  He does not always use his oxygen at home when he is resting as his concentrator is upstairs.  When he is active, he will wear it.  He does use his pulse oximeter at home to keep and eye on his oxygen level.  He notices differences as too if he is hot or cold as the hot air makes it harder to breathe.  He is not using the PLB as much at home, but trying to use it more and uses it most when trying to recover his breathing. [name] states he wears his CPAP but not all the time.  He can sit at the table without oxygen but uses it when he needs it if up and moving.  He has a pulse ox to check O2 at home. [name] has not been wearing his CPAP as it bothers his nose.  He is going to get refitted.  He has stopped using his oxygen at rest but keeps it on while moving.    Goals/Expected Outcomes Short: Become more profiecient at using PLB.   Long: Become independent at using PLB. Short: Use PLB more  Long: Continued compliance. Short:  RT discussed importance of using CPAP Long:  compliance with all O2 prescriptions Short: Get refitted for CPAP  Long: Continue to use meds and improve breathing.           Oxygen Discharge (Final Oxygen Re-Evaluation):  Oxygen Re-Evaluation - 11/26/19 0934      Program Oxygen Prescription   Program Oxygen Prescription Continuous;E-Tanks    Liters per minute 2      Home Oxygen   Home Oxygen Device Home Concentrator;Portable Concentrator    Sleep Oxygen Prescription CPAP    Liters per minute 2    Home Exercise Oxygen Prescription Continuous    Liters per minute 2    Home at Rest Exercise Oxygen Prescription Continuous    Liters per minute 2    Compliance with Home Oxygen Use No       Goals/Expected Outcomes   Short Term Goals To learn and exhibit compliance with exercise, home and travel O2 prescription;To learn and understand importance of monitoring SPO2 with pulse oximeter and demonstrate accurate use of the pulse oximeter.;To learn and understand importance of maintaining oxygen saturations>88%;To learn and demonstrate proper pursed lip breathing techniques or other breathing techniques.;To learn and demonstrate proper use of respiratory medications    Long  Term Goals Exhibits compliance with exercise, home and travel O2 prescription;Verbalizes importance of monitoring SPO2 with pulse oximeter and return demonstration;Maintenance of O2 saturations>88%;Exhibits proper breathing techniques, such as pursed lip breathing or other method taught during program session;Compliance with respiratory medication;Demonstrates proper use of MDI's    Comments Nydia Bouton has not been wearing his CPAP as  it bothers his nose.  He is going to get refitted.  He has stopped using his oxygen at rest but keeps it on while moving.    Goals/Expected Outcomes Short: Get refitted for CPAP  Long: Continue to use meds and improve breathing.           Initial Exercise Prescription:  Initial Exercise Prescription - 09/08/19 1200      Date of Initial Exercise RX and Referring Provider   Date 09/08/19    Referring Provider [name]      Treadmill   MPH 1.5    Grade 0    Minutes 15      Recumbant Bike   Level 1    RPM 60    Minutes 15    METs 1.5      NuStep   Level 1    SPM 80    Minutes 15    METs 1.5      REL-XR   Level 1    Speed 50    Minutes 15    METs 1.5      Prescription Details   Frequency (times per week) 3    Duration Progress to 30 minutes of continuous aerobic without signs/symptoms of physical distress      Intensity   THRR 40-80% of Max Heartrate 113-136    Ratings of Perceived Exertion 11-15    Perceived Dyspnea 0-4      Resistance Training   Training  Prescription Yes    Weight 3 lb    Reps 10-15           Perform Capillary Blood Glucose checks as needed.  Exercise Prescription Changes:  Exercise Prescription Changes    Row Name 09/08/19 1200 09/15/19 1400 09/30/19 1500 10/15/19 1400 10/29/19 0900     Response to Exercise   Blood Pressure (Admit) 114/74 140/80 120/80 130/82 114/68   Blood Pressure (Exercise) 136/84 160/82 142/60 122/82 142/80   Blood Pressure (Exit) 126/74 150/85 112/68 122/70 118/74   Heart Rate (Admit) 90 bpm 89 bpm 82 bpm 90 bpm 91 bpm   Heart Rate (Exercise) 114 bpm 112 bpm 97 bpm 109 bpm 103 bpm   Heart Rate (Exit) 108 bpm 91 bpm 97 bpm 87 bpm 96 bpm   Oxygen Saturation (Admit) 97 % 91 % 98 % 95 % 97 %   Oxygen Saturation (Exercise) 80 % 90 % 96 % 92 % 95 %   Oxygen Saturation (Exit) 89 % 96 % 99 % 97 % 98 %   Rating of Perceived Exertion (Exercise) '13 11 15 15 11   ' Perceived Dyspnea (Exercise) 1 -- '2 1 1   ' Symptoms none none none none none   Duration -- Continue with 30 min of aerobic exercise without signs/symptoms of physical distress. Continue with 30 min of aerobic exercise without signs/symptoms of physical distress. Continue with 30 min of aerobic exercise without signs/symptoms of physical distress. Continue with 30 min of aerobic exercise without signs/symptoms of physical distress.   Intensity -- THRR unchanged THRR unchanged THRR unchanged THRR unchanged     Progression   Progression -- Continue to progress workloads to maintain intensity without signs/symptoms of physical distress. Continue to progress workloads to maintain intensity without signs/symptoms of physical distress. Continue to progress workloads to maintain intensity without signs/symptoms of physical distress. Continue to progress workloads to maintain intensity without signs/symptoms of physical distress.   Average METs -- 3.6 2.7 2.87 2.25     Resistance Training  Training Prescription -- Yes Yes Yes Yes   Weight -- 3 lb 3 lb 5  lb 5 lb   Reps -- 10-15 10-15 10-15 10-15     Interval Training   Interval Training -- No No No No     Oxygen   Oxygen -- -- -- Continuous Continuous   Liters -- -- -- 3 3     Treadmill   MPH -- -- -- 1 1   Grade -- -- -- 0.5 0.5   Minutes -- -- -- 15 15   METs -- -- -- 1.83 1.83     Recumbant Bike   Level -- -- 1 5 --   RPM -- -- 60 -- --   Watts -- -- -- 30 --   Minutes -- -- 15 15 --   METs -- -- 2.1 2.1 --     NuStep   Level -- '2 4 4 ' --   SPM -- -- 80 -- --   Minutes -- '15 15 15 ' --   METs -- 3.1 3.3 2.7 --     REL-XR   Level -- 1 -- -- --   Minutes -- 15 -- -- --   METs -- 4.1 -- -- --     T5 Nustep   Level -- -- -- 1 1   SPM -- -- -- -- 80   Minutes -- -- -- 15 15   METs -- -- -- 1.9 1.9     Home Exercise Plan   Plans to continue exercise at -- -- -- Home (comment)  walking Home (comment)  walking   Frequency -- -- -- Add 2 additional days to program exercise sessions. Add 2 additional days to program exercise sessions.   Initial Home Exercises Provided -- -- -- 10/20/19 10/20/19   Row Name 11/10/19 1300 11/25/19 1300           Response to Exercise   Blood Pressure (Admit) 142/78 124/84      Blood Pressure (Exercise) 142/76 130/86      Blood Pressure (Exit) 122/80 102/60      Heart Rate (Admit) 98 bpm 79 bpm      Heart Rate (Exercise) 103 bpm 91 bpm      Heart Rate (Exit) 101 bpm 89 bpm      Oxygen Saturation (Admit) 92 % 98 %      Oxygen Saturation (Exercise) 95 % 94 %      Oxygen Saturation (Exit) 98 % 98 %      Rating of Perceived Exertion (Exercise) 13 13      Perceived Dyspnea (Exercise) 0 0      Symptoms none none      Duration Continue with 30 min of aerobic exercise without signs/symptoms of physical distress. Continue with 30 min of aerobic exercise without signs/symptoms of physical distress.      Intensity THRR unchanged THRR unchanged        Progression   Progression Continue to progress workloads to maintain intensity without  signs/symptoms of physical distress. Continue to progress workloads to maintain intensity without signs/symptoms of physical distress.      Average METs 2.1 2.5        Resistance Training   Training Prescription Yes Yes      Weight 6 lb 6 lb      Reps 10-15 10-15        Interval Training   Interval Training No No        Oxygen  Oxygen Continuous Continuous      Liters 3 3        Recumbant Bike   Level 5 1.5      Watts 39 --      Minutes 15 15      METs 3.1 2.5        NuStep   Level 4 4      SPM -- 80      Minutes 15 15      METs 3 2.4        Arm Ergometer   Level 1 --      Minutes 15 --      METs 2.2 --        Home Exercise Plan   Plans to continue exercise at Home (comment)  walking --      Frequency Add 2 additional days to program exercise sessions. --      Initial Home Exercises Provided 10/20/19 --             Exercise Comments:  Exercise Comments    Row Name 09/05/19 1049           Exercise Comments His knees may inhibit his exercise function. He is getting injections in his knees and the injections do help his pain. He still has pain "under my kneecap" He is hoping this exercise will help with the knee pain. We did talk about PT for his knees. His physician was going to order PT.              Exercise Goals and Review:  Exercise Goals    Row Name 09/08/19 1300             Exercise Goals   Increase Physical Activity Yes       Intervention Provide advice, education, support and counseling about physical activity/exercise needs.;Develop an individualized exercise prescription for aerobic and resistive training based on initial evaluation findings, risk stratification, comorbidities and participant's personal goals.       Expected Outcomes Short Term: Attend rehab on a regular basis to increase amount of physical activity.;Long Term: Add in home exercise to make exercise part of routine and to increase amount of physical activity.;Long Term:  Exercising regularly at least 3-5 days a week.       Increase Strength and Stamina Yes       Intervention Provide advice, education, support and counseling about physical activity/exercise needs.;Develop an individualized exercise prescription for aerobic and resistive training based on initial evaluation findings, risk stratification, comorbidities and participant's personal goals.       Expected Outcomes Short Term: Increase workloads from initial exercise prescription for resistance, speed, and METs.;Short Term: Perform resistance training exercises routinely during rehab and add in resistance training at home;Long Term: Improve cardiorespiratory fitness, muscular endurance and strength as measured by increased METs and functional capacity (6MWT)       Able to understand and use rate of perceived exertion (RPE) scale Yes       Intervention Provide education and explanation on how to use RPE scale       Expected Outcomes Short Term: Able to use RPE daily in rehab to express subjective intensity level;Long Term:  Able to use RPE to guide intensity level when exercising independently       Able to understand and use Dyspnea scale Yes       Intervention Provide education and explanation on how to use Dyspnea scale       Expected Outcomes  Short Term: Able to use Dyspnea scale daily in rehab to express subjective sense of shortness of breath during exertion;Long Term: Able to use Dyspnea scale to guide intensity level when exercising independently       Knowledge and understanding of Target Heart Rate Range (THRR) Yes       Intervention Provide education and explanation of THRR including how the numbers were predicted and where they are located for reference       Expected Outcomes Short Term: Able to state/look up THRR;Short Term: Able to use daily as guideline for intensity in rehab;Long Term: Able to use THRR to govern intensity when exercising independently       Able to check pulse independently Yes        Intervention Provide education and demonstration on how to check pulse in carotid and radial arteries.;Review the importance of being able to check your own pulse for safety during independent exercise       Expected Outcomes Short Term: Able to explain why pulse checking is important during independent exercise;Long Term: Able to check pulse independently and accurately       Understanding of Exercise Prescription Yes       Intervention Provide education, explanation, and written materials on patient's individual exercise prescription       Expected Outcomes Short Term: Able to explain program exercise prescription;Long Term: Able to explain home exercise prescription to exercise independently              Exercise Goals Re-Evaluation :  Exercise Goals Re-Evaluation    [place] Name 09/10/19 1129 09/15/19 1352 09/30/19 1525 10/01/19 0950 10/15/19 1415     Exercise Goal Re-Evaluation   Exercise Goals Review Increase Physical Activity;Able to understand and use rate of perceived exertion (RPE) scale;Knowledge and understanding of Target Heart Rate Range (THRR);Understanding of Exercise Prescription;Increase Strength and Stamina;Able to check pulse independently;Able to understand and use Dyspnea scale Increase Physical Activity;Increase Strength and Stamina;Understanding of Exercise Prescription Increase Physical Activity;Increase Strength and Stamina;Understanding of Exercise Prescription Increase Physical Activity;Increase Strength and Stamina;Understanding of Exercise Prescription Increase Physical Activity;Increase Strength and Stamina;Understanding of Exercise Prescription   Comments Reviewed RPE and dyspnea scales, THR and program prescription with pt today.  Pt voiced understanding and was given a copy of goals to take home. Nydia Bouton is off to a good start in rehab.   He has completed his first three full days of exercise and blood sugars have been good pre and post.  He is still hesistant on the  treadmill, but we will continue to work with him through it.  We will continue to monitor his progress. [name] has increased workloads on NS and XR.  Staff will continue to work with him on walking. Nydia Bouton is off to a good start in rehab.  He works hard in class and has started to use his recumbent bike at home while watching TV.  He will do it for 57mn at home.  We talked about adding in more time at home.  Updated home exercise with pt today.  Pt plans to walk and use bike at home for exercise.  Reviewed THR, pulse, RPE, sign and symptoms, pulse oximetery and when to call 911 or MD.  Also discussed weather considerations and indoor options.  Pt voiced understanding. LNydia Boutonis doing well in rehab.  He is up to level 5 on the recumbent bike.  We will continue to monitor his progress.   Expected Outcomes Short: Use RPE daily to regulate  intensity. Long: Follow program prescription in THR. Short: Talk about walking to and from rehab.  Long: Continue to follow program prescription and adhere to good attendance Short: work on walking Long:  increase overall MET level and stamina Short: Start to increase exercise time at home to 30 min Long: Continue to improve stamina. Short: Increase T5 workload Long: Continue to improve stamina.   [place] Name 10/29/19 0957 11/10/19 1302 11/25/19 1331 11/26/19 0926 12/09/19 1519     Exercise Goal Re-Evaluation   Exercise Goals Review Increase Physical Activity;Increase Strength and Stamina;Understanding of Exercise Prescription Increase Physical Activity;Increase Strength and Stamina;Understanding of Exercise Prescription Increase Physical Activity;Increase Strength and Stamina;Understanding of Exercise Prescription Increase Physical Activity;Increase Strength and Stamina;Understanding of Exercise Prescription --   Comments Nydia Bouton has tried the TM at 1.0 mph/.5 incline.  Staff will encourage him to continue to work on TM. Nydia Bouton has been doing well in rehab.  He prefers not to be on the  treadmill due to his knee.  He is up to level 5 on the bike.  We will continue to monitor his progress. [name] attends consistently and his oxygen has stayed in the 90s for exercise.  He is up to 6 lbfor strength work. Nydia Bouton is doing well in rehab.  He is feeling stronger overall.  He is exercising at home on his off days in the garage with fans. Out since last review   Expected Outcomes Short:  build stamina on TM Long: improve overall MET level Short: Ensure he is maintainin at least 80 spm on steppers  Long: Continue to improve stamina. Short: continue to attend consistently Long:  improve overall MET level Short: Continue to exercise on his off days Long: Continue to exercise on off days. --          Discharge Exercise Prescription (Final Exercise Prescription Changes):  Exercise Prescription Changes - 11/25/19 1300      Response to Exercise   Blood Pressure (Admit) 124/84    Blood Pressure (Exercise) 130/86    Blood Pressure (Exit) 102/60    Heart Rate (Admit) 79 bpm    Heart Rate (Exercise) 91 bpm    Heart Rate (Exit) 89 bpm    Oxygen Saturation (Admit) 98 %    Oxygen Saturation (Exercise) 94 %    Oxygen Saturation (Exit) 98 %    Rating of Perceived Exertion (Exercise) 13    Perceived Dyspnea (Exercise) 0    Symptoms none    Duration Continue with 30 min of aerobic exercise without signs/symptoms of physical distress.    Intensity THRR unchanged      Progression   Progression Continue to progress workloads to maintain intensity without signs/symptoms of physical distress.    Average METs 2.5      Resistance Training   Training Prescription Yes    Weight 6 lb    Reps 10-15      Interval Training   Interval Training No      Oxygen   Oxygen Continuous    Liters 3      Recumbant Bike   Level 1.5    Minutes 15    METs 2.5      NuStep   Level 4    SPM 80    Minutes 15    METs 2.4           Nutrition:  Target Goals: Understanding of nutrition guidelines, daily  intake of sodium <1511m, cholesterol <2082m calories 30% from fat and 7% or less  from saturated fats, daily to have 5 or more servings of fruits and vegetables.  Education: Controlling Sodium/Reading Food Labels -Group verbal and written material supporting the discussion of sodium use in heart healthy nutrition. Review and explanation with models, verbal and written materials for utilization of the food label.   Pulmonary Rehab from 08/15/2017 in [facility] Cardiac and Pulmonary Rehab  Date 07/30/17  Educator CR  Instruction Review Code 1- [employer] Understanding      Education: General Nutrition Guidelines/Fats and Fiber: -Group instruction provided by verbal, written material, models and posters to present the general guidelines for heart healthy nutrition. Gives an explanation and review of dietary fats and fiber.   Pulmonary Rehab from 08/15/2017 in [facility] Cardiac and Pulmonary Rehab  Date 07/23/17  Educator CR  Instruction Review Code 1- Verbalizes Understanding      Biometrics:  Pre Biometrics - 09/08/19 1301      Pre Biometrics   Height 5' 9.5" (1.765 m)    Weight 221 lb 4.8 oz (100.4 kg)    BMI (Calculated) 32.22            Nutrition Therapy Plan and Nutrition Goals:  Nutrition Therapy & Goals - 10/13/19 0953      Nutrition Therapy   Diet heart healthy, low Na, diabetic    Drug/Food Interactions Statins/Certain Fruits    Protein (specify units) 120g    Fiber 30 grams    Whole Grain Foods 3 servings    Saturated Fats 12 max. grams    Fruits and Vegetables 5 servings/day    Sodium 1.5 grams      Personal Nutrition Goals   Nutrition Goal ST: does not want to make short term goals at this time LT: wants to be able to walk without having assistance, now on 3L of O2 when exercising and 2L when not active; SOB affects activities of daily living  - needs to take breaks.    Comments 5.7 most recent A1C. B: Toast (potato bread - jelly and butter) and coffee - [employer]bucks coffee  (latte). S: bananas and other fruit D: spaghetti, chicken, sweet potatos, greens, and other vegetables; no salt, pt doesn't know what girlfriend cooks with S: chocolate pretzles, ice cream (gets one that has less sugar)  Drinks water all the time, during and in between meals. Lower appetite, sometimes doesn't eat at all. Discussed heart healthy eating and pulmonary MNT. Pt A1c 5.6 - eats many "naked" and simple CHOs, likely low A1C due to lower food consumption. Pt reports not eating much and sometimes does not eat at all. Pt also reports weight loss.      Intervention Plan   Intervention Prescribe, educate and counsel regarding individualized specific dietary modifications aiming towards targeted core components such as weight, hypertension, lipid management, diabetes, heart failure and other comorbidities.;Nutrition handout(s) given to patient.    Expected Outcomes Short Term Goal: Understand basic principles of dietary content, such as calories, fat, sodium, cholesterol and nutrients.;Short Term Goal: A plan has been developed with personal nutrition goals set during dietitian appointment.;Long Term Goal: Adherence to prescribed nutrition plan.           Nutrition Assessments:  Nutrition Assessments - 09/08/19 1303      MEDFICTS Scores   Pre Score 40           MEDIFICTS Score Key:          ?70 Need to make dietary changes          40-70 Heart  Healthy Diet         ? 40 Therapeutic Level Cholesterol Diet  Nutrition Goals Re-Evaluation:  Nutrition Goals Re-Evaluation    [place] Name 10/27/19 0939 11/26/19 0932           Goals   Nutrition Goal ST: [name] is reducing portion sizes to lose weight Long:  continue to exercise and eat smaller protions ST: [name] is reducing portion sizes to lose weight Long:  continue to exercise and eat smaller protions      Comment -- Nydia Bouton is doing well in rehab.  He is eating smaller portions and snacks throughout the day.  He is also getting in protein  shakes.  His protein is now where he wants it to be. His iron levels were a little low so we talked about ways to boost that again.      Expected Outcome -- Short: Build iron back up wiht vit C Long; Continue to eat better.             Nutrition Goals Discharge (Final Nutrition Goals Re-Evaluation):  Nutrition Goals Re-Evaluation - 11/26/19 0932      Goals   Nutrition Goal ST: [name] is reducing portion sizes to lose weight Long:  continue to exercise and eat smaller protions    Comment [name] is doing well in rehab.  He is eating smaller portions and snacks throughout the day.  He is also getting in protein shakes.  His protein is now where he wants it to be. His iron levels were a little low so we talked about ways to boost that again.    Expected Outcome Short: Build iron back up wiht vit C Long; Continue to eat better.           Psychosocial: Target Goals: Acknowledge presence or absence of significant depression and/or stress, maximize coping skills, provide positive support system. Participant is able to verbalize types and ability to use techniques and skills needed for reducing stress and depression.   Education: Depression - Provides group verbal and written instruction on the correlation between heart/lung disease and depressed mood, treatment options, and the stigmas associated with seeking treatment.   Education: Sleep Hygiene -Provides group verbal and written instruction about how sleep can affect your health.  Define sleep hygiene, discuss sleep cycles and impact of sleep habits. Review good sleep hygiene tips.    Pulmonary Rehab from 08/15/2017 in [facility] Cardiac and Pulmonary Rehab  Date 08/15/17  Educator [facility]  Instruction Review Code 1- Verbalizes Understanding      Education: Stress and Anxiety: - Provides group verbal and written instruction about the health risks of elevated stress and causes of high stress.  Discuss the correlation between heart/lung disease and  anxiety and treatment options. Review healthy ways to manage with stress and anxiety.   Pulmonary Rehab from 08/15/2017 in [facility] Cardiac and Pulmonary Rehab  Date 07/04/17  Educator [facility]  Instruction Review Code 1- Verbalizes Understanding      Initial Review & Psychosocial Screening:  Initial Psych Review & Screening - 09/05/19 1028      Initial Review   Current issues with Current Stress Concerns    Source of Stress Concerns Unable to perform yard/household activities;Unable to participate in former interests or hobbies;Chronic Illness    Comments COPD is well controlled, was told his lung tests are worse.  his knees prevent him from participating with daily chores      [place]? Yes  Girlfriend Electronics engineer, DAra's sister in [place],  [place] in [place] and [place] Girlfriend Dara, Dara's sister in [place],  [place] in [place] and [place]      Barriers   Psychosocial barriers to participate in program There are no identifiable barriers or psychosocial needs.;The patient should benefit from training in stress management and relaxation.      Screening Interventions   Interventions Encouraged to exercise;To provide support and resources with identified psychosocial needs;Provide feedback about the scores to participant    Expected Outcomes Short Term goal: Utilizing psychosocial counselor, staff and physician to assist with identification of specific Stressors or current issues interfering with healing process. Setting desired goal for each stressor or current issue identified.;Long Term Goal: Stressors or current issues are controlled or eliminated.;Short Term goal: Identification and review with participant of any Quality of Life or Depression concerns found by scoring the questionnaire.;Long Term goal: The participant improves quality of Life and PHQ9 Scores as seen by post scores and/or verbalization of changes           Quality of Life Scores:  Scores of 19  and below usually indicate a poorer quality of life in these areas.  A difference of  2-3 points is a clinically meaningful difference.  A difference of 2-3 points in the total score of the Quality of Life Index has been associated with significant improvement in overall quality of life, self-image, physical symptoms, and general health in studies assessing change in quality of life.  PHQ-9: Recent Review Flowsheet Data    Depression screen [facility] 2/9 11/26/2019 10/22/2019 10/01/2019 09/08/2019 06/25/2019   Decreased Interest '2 1 2 3  ' 0   Down, Depressed, Hopeless 1 0 1 2 0   PHQ - 2 Score '3 1 3 5 ' 0   Altered sleeping 1 0 1 3  -   Tired, decreased energy '3 2 3 3  ' -   Change in appetite 0 0 0 0 -   Feeling bad or failure about yourself  0 0 0 0 -   Trouble concentrating '1 1 1 1 ' -   Moving slowly or fidgety/restless 0 0 0 1 -   Suicidal thoughts 0 0 0 0 -   PHQ-9 Score '8 4 8 13 ' -   Difficult doing work/chores Somewhat difficult Somewhat difficult Not difficult at all Somewhat difficult -     Interpretation of Total Score  Total Score Depression Severity:  1-4 = Minimal depression, 5-9 = Mild depression, 10-14 = Moderate depression, 15-19 = Moderately severe depression, 20-27 = Severe depression   Psychosocial Evaluation and Intervention:  Psychosocial Evaluation - 09/05/19 1041      Psychosocial Evaluation & Interventions   Interventions Encouraged to exercise with the program and follow exercise prescription    Comments [name] has no barriers to returning to the program after over a year. He stated that he has not been doing his usual exercise regimen due to the [place] [facility]. He is glad to have the referral so he can get back to a routine of exercise . He lives with his girlfrient Tonette Bihari (calls her Rose) and 2 dogs. He has a great support system with Rose. Other support is Rose's sister in [place], who is a Marine scientist and his children that live in [place] and [place]. His knees may inhibit his exercise function. He  is getting injections in his knees and the injections do help his pain. He still has pain "under my kneecap" He is hoping this  exercise will help with the knee pain. We did talk about PT for his knees. His physician was going to order PT. [name] should do well with the program    Expected Outcomes STG: [name] will benefit from consistent exercise to achieve his stated goals.  LTG [name] will after discharge be able to continue with his exercise to maintain his progress    Continue Psychosocial Services  Follow up required by staff           Psychosocial Re-Evaluation:  Psychosocial Re-Evaluation    [place] Name 10/01/19 [id number] 10/22/19 1041 10/27/19 0938 11/26/19 0928       Psychosocial Re-Evaluation   Current issues with Current Depression;Current Stress Concerns;Current Sleep Concerns Current Depression;Current Stress Concerns;Current Sleep Concerns Current Depression;Current Stress Concerns;Current Sleep Concerns Current Depression;Current Stress Concerns;Current Sleep Concerns    Comments Nydia Bouton is doing well in rehab. His PHQ score has improved from 13 to 8!  He is feeling better getting back into an exercise routine. He is also feleing like his attitude has gotten better.  He is now on a mission to lose weight.  He is using his CPAP again and helping him sleep better. Nydia Bouton is doing well in rehab. His PHQ score has improved from 13 to 8 and now 8 to 4!  He is using his CPAP again and helping him sleep better. [name] reports getting support from his sisters and that right now he isn't doing any therapy. [name] reports that he feels well and doesn't feel depressed. -- Nydia Bouton is doing well in rehab.  He enjoys coming to class as an outlet.  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  His PHQ has creeped back up after all this again.  He continues to struggle with sleep.  He is trying to stay positive.  His favorite things are his dogs as they bring him the most joy.     Expected Outcomes Short: Continue with new attitude. Long: Continue to stay positive. Short: Continue with new attitude. Long: Continue to stay positive. -- Short: Continue to exercise to get mental boost Long: Continue to stay positive    Interventions Encouraged to attend Pulmonary Rehabilitation for the exercise;Stress management education Encouraged to attend Pulmonary Rehabilitation for the exercise;Stress management education -- Encouraged to attend Pulmonary Rehabilitation for the exercise;Stress management education    Continue Psychosocial Services  Follow up required by staff Follow up required by staff -- Follow up required by staff    Comments -- COPD is well controlled, was told his lung tests are worse.  his knees prevent him from participating with daily chores -- --      Initial Review   Source of Stress Concerns -- Unable to perform yard/household activities;Unable to participate in former interests or hobbies;Chronic Illness -- --           Psychosocial Discharge (Final Psychosocial Re-Evaluation):  Psychosocial Re-Evaluation - 11/26/19 0928      Psychosocial Re-Evaluation   Current issues with Current Depression;Current Stress Concerns;Current Sleep Concerns    Comments Nydia Bouton is doing well in rehab.  He enjoys coming to class as an outlet.  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  His PHQ has creeped back up after all this again.  He continues to struggle with sleep.  He is trying to stay positive.  His favorite things are his dogs as they bring him  the most joy.    Expected Outcomes Short: Continue to exercise to get mental boost Long: Continue to stay positive    Interventions Encouraged to attend Pulmonary Rehabilitation for the exercise;Stress management education    Continue Psychosocial Services  Follow up required by staff           Education: Education Goals: Education classes will be provided on a weekly  basis, covering required topics. Participant will state understanding/return demonstration of topics presented.  Learning Barriers/Preferences:  Learning Barriers/Preferences - 09/05/19 1034      Learning Barriers/Preferences   Learning Barriers None    Learning Preferences None           General Pulmonary Education Topics:  Infection Prevention: - Provides verbal and written material to individual with discussion of infection control including proper hand washing and proper equipment cleaning during exercise session.   Pulmonary Rehab from 11/26/2019 in [facility] Cardiac and Pulmonary Rehab  Date 09/08/19  Educator AS  Instruction Review Code 1- Verbalizes Understanding      Falls Prevention: - Provides verbal and written material to individual with discussion of falls prevention and safety.   Pulmonary Rehab from 11/26/2019 in [facility] Cardiac and Pulmonary Rehab  Date 09/08/19  Educator AS  Instruction Review Code 1- Verbalizes Understanding      Chronic Lung Diseases: - Group verbal and written instruction to review updates, respiratory medications, advancements in procedures and treatments. Discuss use of supplemental oxygen including available portable oxygen systems, continuous and intermittent flow rates, concentrators, personal use and safety guidelines. Review proper use of inhaler and spacers. Provide informative websites for self-education.    Pulmonary Rehab from 08/15/2017 in [facility] Cardiac and Pulmonary Rehab  Date 07/25/17  Educator [facility]  Instruction Review Code 1- Verbalizes Understanding      Energy Conservation: - Provide group verbal and written instruction for methods to conserve energy, plan and organize activities. Instruct on pacing techniques, use of adaptive equipment and posture/positioning to relieve shortness of breath.   Pulmonary Rehab from 08/15/2017 in [facility] Cardiac and Pulmonary Rehab  Date 06/27/17  Educator [facility]  Instruction Review Code 1- Verbalizes  Understanding      Triggers and Exacerbations: - Group verbal and written instruction to review types of environmental triggers and ways to prevent exacerbations. Discuss weather changes, air quality and the benefits of nasal washing. Review warning signs and symptoms to help prevent infections. Discuss techniques for effective airway clearance, coughing, and vibrations.   Pulmonary Rehab from 05/03/2016 in [facility] Cardiac and Pulmonary Rehab  Date 04/26/16  Educator LB  Instruction Review Code (retired) 2- meets goals/outcomes      AED/CPR: - Group verbal and written instruction with the use of models to demonstrate the basic use of the AED with the basic ABC's of resuscitation.   Pulmonary Rehab from 08/15/2017 in [facility] Cardiac and Pulmonary Rehab  Date 06/29/17  Educator [facility]  Instruction Review Code 1- Actuary and Physiology of the Lungs: - Group verbal and written instruction with the use of models to provide basic lung anatomy and physiology related to function, structure and complications of lung disease.   Pulmonary Rehab from 08/15/2017 in [facility] Cardiac and Pulmonary Rehab  Date 07/11/17  Educator [facility]  Instruction Review Code 1- Verbalizes Understanding      Anatomy & Physiology of the Heart: - Group verbal and written instruction and models provide basic cardiac anatomy and physiology, with the coronary electrical and arterial systems. Review  of Valvular disease and Heart Failure   Cardiac Medications: - Group verbal and written instruction to review commonly prescribed medications for heart disease. Reviews the medication, class of the drug, and side effects.   Pulmonary Rehab from 11/26/2019 in [facility] Cardiac and Pulmonary Rehab  Date 11/26/19  Educator SB  Instruction Review Code 1- Verbalizes Understanding      Other: -Provides group and verbal instruction on various topics (see comments)   Pulmonary Rehab from 08/15/2017 in [facility] Cardiac and  Pulmonary Rehab  Date 05/23/17  Educator [facility]  Instruction Review Code 1- Verbalizes Understanding  [SLEEP]      Knowledge Questionnaire Score:  Knowledge Questionnaire Score - 09/08/19 1303      Knowledge Questionnaire Score   Pre Score 16/18            Core Components/Risk Factors/Patient Goals at Admission:  Personal Goals and Risk Factors at Admission - 09/08/19 1314      Core Components/Risk Factors/Patient Goals on Admission    Weight Management Weight Loss;Yes    Intervention Weight Management: Develop a combined nutrition and exercise program designed to reach desired caloric intake, while maintaining appropriate intake of nutrient and fiber, sodium and fats, and appropriate energy expenditure required for the weight goal.;Weight Management: Provide education and appropriate resources to help participant work on and attain dietary goals.;Weight Management/Obesity: Establish reasonable short term and long term weight goals.;Obesity: Provide education and appropriate resources to help participant work on and attain dietary goals.    Admit Weight 221 lb 4.8 oz (100.4 kg)    Goal Weight: Short Term 215 lb (97.5 kg)    Goal Weight: Long Term 200 lb (90.7 kg)    Expected Outcomes Short Term: Continue to assess and modify interventions until short term weight is achieved;Long Term: Adherence to nutrition and physical activity/exercise program aimed toward attainment of established weight goal;Weight Maintenance: Understanding of the daily nutrition guidelines, which includes 25-35% calories from fat, 7% or less cal from saturated fats, less than 29m cholesterol, less than 1.5gm of sodium, & 5 or more servings of fruits and vegetables daily;Weight Loss: Understanding of general recommendations for a balanced deficit meal plan, which promotes 1-2 lb weight loss per week and includes a negative energy balance of [phone] kcal/d    Diabetes Yes    Intervention Provide education about  signs/symptoms and action to take for hypo/hyperglycemia.;Provide education about proper nutrition, including hydration, and aerobic/resistive exercise prescription along with prescribed medications to achieve blood glucose in normal ranges: Fasting glucose 65-99 mg/dL    Expected Outcomes Short Term: Participant verbalizes understanding of the signs/symptoms and immediate care of hyper/hypoglycemia, proper foot care and importance of medication, aerobic/resistive exercise and nutrition plan for blood glucose control.;Long Term: Attainment of HbA1C < 7%.    Intervention Provide a combined exercise and nutrition program that is supplemented with education, support and counseling about heart failure. Directed toward relieving symptoms such as shortness of breath, decreased exercise tolerance, and extremity edema.    Expected Outcomes Improve functional capacity of life;Short term: Attendance in program 2-3 days a week with increased exercise capacity. Reported lower sodium intake. Reported increased fruit and vegetable intake. Reports medication compliance.;Short term: Daily weights obtained and reported for increase. Utilizing diuretic protocols set by physician.;Long term: Adoption of self-care skills and reduction of barriers for early signs and symptoms recognition and intervention leading to self-care maintenance.    Intervention Provide education on lifestyle modifcations including regular physical activity/exercise, weight management, moderate sodium restriction and  increased consumption of fresh fruit, vegetables, and low fat dairy, alcohol moderation, and smoking cessation.;Monitor prescription use compliance.    Expected Outcomes Short Term: Continued assessment and intervention until BP is < 140/85m HG in hypertensive participants. < 130/824mHG in hypertensive participants with diabetes, heart failure or chronic kidney disease.;Long Term: Maintenance of blood pressure at goal levels.            Education:Diabetes - Individual verbal and written instruction to review signs/symptoms of diabetes, desired ranges of glucose level fasting, after meals and with exercise. Acknowledge that pre and post exercise glucose checks will be done for 3 sessions at entry of program.   [facility]  Date 04/04/16  Educator LB  Instruction Review Code (retired) 2- meets goals/outcomes      Education: Know Your Numbers and Risk Factors: -Group verbal and written instruction about important numbers in your health.  Discussion of what are risk factors and how they play a role in the disease process.  Review of Cholesterol, Blood Pressure, Diabetes, and BMI and the role they play in your overall health.   [facility]  Date 08/10/17  [facility]- Verbalizes Understanding      Core Components/Risk Factors/Patient Goals Review:   Goals and Risk Factor Review    Row Name 10/01/19 [id number]/21 0934 11/26/19 0936         Core Components/Risk Factors/Patient Goals Review   Personal Goals Review Weight Management/Obesity;Improve shortness of breath with ADL's;Heart Failure;Hypertension Weight Management/Obesity;Improve shortness of breath with ADL's;Heart Failure;Hypertension Weight Management/Obesity;Improve shortness of breath with ADL's;Heart Failure;Hypertension     Review LaNydia Boutons doing well in rehab.  He is now on a new mission to lose weight and get under 200 lb.  His breathing is getting better and he does not need his oxygen at rest at home.  He denies heart failure symptoms currently and saw TiOtila Kluverecently and got a good report.  He is scheduled for an echo on June 16.  Blood pressures have been good and he does check them at home. [name] wants to lose 15 more lb.  He is exercising here and cutting down postion sizes.  [name] feels his SOB has improved since he has  started to use better breathing techniques.  He talks to his Dr today about the echo. [name] continues to work on weight loss.  His goal is to get to 200 lb before he graduates. He is doing better with is breathing at rest, just when he is working hard.  He is feeling better overall.  He is doing well with his blood pressures and he checks them at home regularly.  He is planning to meet with pain management after vacation next week.  He denies  any heart failure symptoms.     Expected Outcomes Short: Continue to work on weight loss Long: Continue to improve breathing using PLB. Short: continue to exercise and eat better Long: continue to practice breathing techniques Short: Continue to work on weight loss Long: Continue to montior risk factors.            Core Components/Risk Factors/Patient Goals at Discharge (Final Review):   Goals and Risk Factor Review - 11/26/19 0936      Core Components/Risk Factors/Patient Goals Review   Personal Goals Review Weight Management/Obesity;Improve shortness of breath with ADL's;Heart Failure;Hypertension    Review [name] continues to  work on weight loss.  His goal is to get to 200 lb before he graduates. He is doing better with is breathing at rest, just when he is working hard.  He is feeling better overall.  He is doing well with his blood pressures and he checks them at home regularly.  He is planning to meet with pain management after vacation next week.  He denies  any heart failure symptoms.    Expected Outcomes Short: Continue to work on weight loss Long: Continue to montior risk factors.           ITP Comments:  ITP Comments    Row Name 09/05/19 1004 09/08/19 1312 09/10/19 1129 09/17/19 0533 10/15/19 0548   ITP Comments Virtual Orientation completed today. Exercise evaluation scheduled for Monday 5/10 with gym orientation. Documentation for diagnosis can be found in [facility] 4/21. Completed 6MWT and gym orientation.  Initial ITP created and sent for review to  Dr. Emily Filbert, Medical Director. First full day of exercise!  Patient was oriented to gym and equipment including functions, settings, policies, and procedures.  Patient's individual exercise prescription and treatment plan were reviewed.  All starting workloads were established based on the results of the 6 minute walk test done at initial orientation visit.  The plan for exercise progression was also introduced and progression will be customized based on patient's performance and goals. 30 Day review completed. ITP review done, changes made as directed,and approval shown by signature of  Scientist, research (life sciences).  New to program 30 Day review completed. Medical Director ITP review done, changes made as directed, and signed approval by Medical Director.   [place] Name 11/12/19 0631 12/09/19 1519 12/10/19 0541       ITP Comments 30 Day review completed. Medical Director ITP review done, changes made as directed, and signed approval by Medical Director. Pt out on vacation 30 Day review completed. Medical Director ITP review done, changes made as directed, and signed approval by Medical Director.            Comments:

## 2019-12-12 ENCOUNTER — Encounter: Payer: Medicare Other | Admitting: *Deleted

## 2019-12-12 DIAGNOSIS — G4730 Sleep apnea, unspecified: Secondary | ICD-10-CM | POA: Diagnosis not present

## 2019-12-12 DIAGNOSIS — J449 Chronic obstructive pulmonary disease, unspecified: Secondary | ICD-10-CM

## 2019-12-12 NOTE — Progress Notes (Signed)
Daily Session Note  Patient Details  Name: Pesach Frisch MRN: [id number] Date of Birth: 05/28/1945 Referring Provider:     Pulmonary Rehab from 09/08/2019 in [facility] Cardiac and Pulmonary Rehab  Referring Provider [name]      Encounter Date: 12/12/2019  Check In:  Session Check In - 12/12/19 7482      Check-In   Supervising physician immediately available to respond to emergencies See telemetry face sheet for immediately available ER MD    Location [facility]-Cardiac & Pulmonary Rehab    Staff Present Renita Papa, RN BSN;Joseph [address] [place], [place], [place], CCRP, CCET    Virtual Visit No    Medication changes reported     No    Fall or balance concerns reported    No    Warm-up and Cool-down Performed on first and last piece of equipment    Resistance Training Performed Yes    VAD Patient? No    PAD/SET Patient? No      Pain Assessment   Currently in Pain? No/denies              Social History   Tobacco Use  Smoking Status Former Smoker  . Packs/day: 0.50  . Years: 1.00  . Pack years: 0.50  . Types: Cigarettes  . Quit date: 05/01/1969  . Years since quitting: 50.6  Smokeless Tobacco Never Used  Tobacco Comment   Quit 1971    Goals Met:  Independence with exercise equipment Exercise tolerated well No report of cardiac concerns or symptoms Strength training completed today  Goals Unmet:  Not Applicable  Comments: Pt able to follow exercise prescription today without complaint.  Will continue to monitor for progression.    Dr. Emily Filbert is Medical Director for [place] and [name].

## 2019-12-15 ENCOUNTER — Encounter: Payer: Medicare Other | Admitting: *Deleted

## 2019-12-15 ENCOUNTER — Other Ambulatory Visit: Payer: Self-pay

## 2019-12-15 DIAGNOSIS — J449 Chronic obstructive pulmonary disease, unspecified: Secondary | ICD-10-CM | POA: Diagnosis not present

## 2019-12-15 NOTE — Progress Notes (Signed)
Daily Session Note  Patient Details  Name: Rayquon Uselman MRN: [id number] Date of Birth: 08-Mar-1946 Referring Provider:     Pulmonary Rehab from 09/08/2019 in [facility] Cardiac and Pulmonary Rehab  Referring Provider [name]      Encounter Date: 12/15/2019  Check In:  Session Check In - 12/15/19 1035      Check-In   Supervising physician immediately available to respond to emergencies See telemetry face sheet for immediately available ER MD    Location [facility]-Cardiac & Pulmonary Rehab    Staff Present Heath Lark, RN, BSN, Laveda Norman, BS, ACSM CEP, Exercise Physiologist;Joseph Tessie Fass RCP,RRT,BSRT    Virtual Visit No    Medication changes reported     No    Fall or balance concerns reported    No    Warm-up and Cool-down Performed on first and last piece of equipment    Resistance Training Performed Yes    VAD Patient? No    PAD/SET Patient? No      Pain Assessment   Currently in Pain? No/denies              Social History   Tobacco Use  Smoking Status Former Smoker  . Packs/day: 0.50  . Years: 1.00  . Pack years: 0.50  . Types: Cigarettes  . Quit date: 05/01/1969  . Years since quitting: 50.6  Smokeless Tobacco Never Used  Tobacco Comment   Quit 1971    Goals Met:  Proper associated with RPD/PD & O2 Sat Independence with exercise equipment Exercise tolerated well No report of cardiac concerns or symptoms  Goals Unmet:  Not Applicable  Comments: Pt able to follow exercise prescription today without complaint.  Will continue to monitor for progression.    Dr. Emily Filbert is Medical Director for [place] and [name].

## 2019-12-17 ENCOUNTER — Encounter: Payer: Medicare Other | Admitting: *Deleted

## 2019-12-17 ENCOUNTER — Other Ambulatory Visit: Payer: Self-pay

## 2019-12-17 DIAGNOSIS — J449 Chronic obstructive pulmonary disease, unspecified: Secondary | ICD-10-CM

## 2019-12-17 NOTE — Progress Notes (Signed)
Daily Session Note  Patient Details  Name: Hensley Treat MRN: [id number] Date of Birth: 12-Nov-1945 Referring Provider:     Pulmonary Rehab from 09/08/2019 in [facility] Cardiac and Pulmonary Rehab  Referring Provider [name]      Encounter Date: 12/17/2019  Check In:  Session Check In - 12/17/19 1004      Check-In   Supervising physician immediately available to respond to emergencies See telemetry face sheet for immediately available ER MD    Location [facility]-Cardiac & Pulmonary Rehab    Staff Present Heath Lark, RN, BSN, CCRP;Jessica [place], MA, RCEP, CCRP, CCET;Joseph [employer], [place], ACSM CEP, Exercise Physiologist    Virtual Visit No    Medication changes reported     No    Fall or balance concerns reported    No    Warm-up and Cool-down Performed on first and last piece of equipment    Resistance Training Performed Yes    VAD Patient? No    PAD/SET Patient? No      Pain Assessment   Currently in Pain? No/denies              Social History   Tobacco Use  Smoking Status Former Smoker  . Packs/day: 0.50  . Years: 1.00  . Pack years: 0.50  . Types: Cigarettes  . Quit date: 05/01/1969  . Years since quitting: 50.6  Smokeless Tobacco Never Used  Tobacco Comment   Quit 1971    Goals Met:  Proper associated with RPD/PD & O2 Sat Independence with exercise equipment Exercise tolerated well No report of cardiac concerns or symptoms  Goals Unmet:  Not Applicable  Comments: Pt able to follow exercise prescription today without complaint.  Will continue to monitor for progression.    Dr. Emily Filbert is Medical Director for [place] and [name].

## 2019-12-19 ENCOUNTER — Other Ambulatory Visit: Payer: Self-pay

## 2019-12-19 ENCOUNTER — Encounter: Payer: Medicare Other | Admitting: *Deleted

## 2019-12-19 DIAGNOSIS — J449 Chronic obstructive pulmonary disease, unspecified: Secondary | ICD-10-CM

## 2019-12-19 NOTE — Progress Notes (Signed)
Daily Session Note  Patient Details  Name: Mario [name] MRN: [id number] Date of Birth: 1946/03/07 Referring Provider:     Pulmonary Rehab from 09/08/2019 in [facility] Cardiac and Pulmonary Rehab  Referring Provider Mario [name]      Encounter Date: 12/19/2019  Check In:  Session Check In - 12/19/19 1051      Check-In   Supervising physician immediately available to respond to emergencies See telemetry face sheet for immediately available ER MD    Location [facility]-Cardiac & Pulmonary Rehab    Staff Present Mario Lark, RN, BSN, CCRP;Mario [place], MA, RCEP, CCRP, CCET;Mario [name] RCP,RRT,BSRT;Mario [name] RDN, [place]    Virtual Visit No    Medication changes reported     No    Fall or balance concerns reported    No    Warm-up and Cool-down Performed on first and last piece of equipment    Resistance Training Performed Yes    VAD Patient? No    PAD/SET Patient? No      Pain Assessment   Currently in Pain? No/denies              Social History   Tobacco Use  Smoking Status Former Smoker  . Packs/day: 0.50  . Years: 1.00  . Pack years: 0.50  . Types: Cigarettes  . Quit date: 05/01/1969  . Years since quitting: 50.6  Smokeless Tobacco Never Used  Tobacco Comment   Quit 1971    Goals Met:  Proper associated with RPD/PD & O2 Sat Independence with exercise equipment Exercise tolerated well No report of cardiac concerns or symptoms  Goals Unmet:  Not Applicable  Comments: Pt able to follow exercise prescription today without complaint.  Will continue to monitor for progression.    Dr. Emily Filbert is Medical Director for [place] and [name].

## 2019-12-22 ENCOUNTER — Other Ambulatory Visit: Payer: Self-pay

## 2019-12-22 ENCOUNTER — Encounter: Payer: Medicare Other | Admitting: *Deleted

## 2019-12-22 VITALS — Ht 69.5 in | Wt 219.7 lb

## 2019-12-22 DIAGNOSIS — J449 Chronic obstructive pulmonary disease, unspecified: Secondary | ICD-10-CM | POA: Diagnosis not present

## 2019-12-22 NOTE — Progress Notes (Signed)
Daily Session Note  Patient Details  Name: Torrie Lafavor MRN: [id number] Date of Birth: 19-Dec-1945 Referring Provider:     Pulmonary Rehab from 09/08/2019 in [facility] Cardiac and Pulmonary Rehab  Referring Provider [name]      Encounter Date: 12/22/2019  Check In:  Session Check In - 12/22/19 1008      Check-In   Supervising physician immediately available to respond to emergencies See telemetry face sheet for immediately available ER MD    Location [facility]-Cardiac & Pulmonary Rehab    Staff Present Heath Lark, RN, BSN, CCRP;Chemika [place], MA, RCEP, CCRP, [place], BS, ACSM CEP, Exercise Physiologist    Virtual Visit No    Medication changes reported     No    Fall or balance concerns reported    No    Tobacco Cessation No Change    Warm-up and Cool-down Performed on first and last piece of equipment    Resistance Training Performed Yes    VAD Patient? No    PAD/SET Patient? No      Pain Assessment   Currently in Pain? No/denies              Social History   Tobacco Use  Smoking Status Former Smoker   Packs/day: 0.50   Years: 1.00   Pack years: 0.50   Types: Cigarettes   Quit date: 05/01/1969   Years since quitting: 50.6  Smokeless Tobacco Never Used  Tobacco Comment   Quit 1971    Goals Met:  Proper associated with RPD/PD & O2 Sat Independence with exercise equipment Using PLB without cueing & demonstrates good technique Exercise tolerated well No report of cardiac concerns or symptoms Strength training completed today  Goals Unmet:  Not Applicable  Comments: Pt able to follow exercise prescription today without complaint.  Will continue to monitor for progression.   [place] Name 09/08/19 1242 12/22/19 0937       6 Minute Walk   Phase -- Discharge    Distance 595 feet 777 feet    Distance % Change -- 30.6 %    Distance Feet Change -- 182 ft    Walk Time 4.1 minutes 6 minutes    # of Rest Breaks 1 0    MPH 1.65 1.47     METS 1.43 1.77    RPE 13 15    Perceived Dyspnea  1 3    VO2 Peak 5.01 6.19    Symptoms No Yes (comment)    Comments -- knee pain 10/10    Resting HR 90 bpm 72 bpm    Resting BP 114/74 128/74    Resting Oxygen Saturation  97 % 94 %    Exercise Oxygen Saturation  during 6 min walk 80 % 83 %    Max Ex. HR 114 bpm 101 bpm    Max Ex. BP 136/84 154/74    2 Minute Post BP 126/74 132/74      Interval HR   1 Minute HR 103 91    2 Minute HR 106 100    3 Minute HR 104 101    4 Minute HR 103 98    5 Minute HR 105 99    6 Minute HR 114 96    2 Minute Post HR 108 88    Interval Heart Rate? Yes Yes      Interval Oxygen   Interval Oxygen? Yes Yes    Baseline Oxygen Saturation % 97 %  94 %    1 Minute Oxygen Saturation % 84 % 88 %    1 Minute Liters of Oxygen 2 L 3 L    2 Minute Oxygen Saturation % 81 % 85 %    2 Minute Liters of Oxygen 2 L 3 L    3 Minute Oxygen Saturation % 80 % 83 %    3 Minute Liters of Oxygen 2 L 3 L    4 Minute Oxygen Saturation % 88 % 86 %    4 Minute Liters of Oxygen 2 L 3 L    5 Minute Oxygen Saturation % 87 % 86 %    5 Minute Liters of Oxygen 2 L 3 L    6 Minute Oxygen Saturation % 84 % 85 %    6 Minute Liters of Oxygen 2 L 3 L    2 Minute Post Oxygen Saturation % 89 % 97 %    2 Minute Post Liters of Oxygen 2 L 3 L             Dr. Emily Filbert is Medical Director for [place] and [name].

## 2019-12-31 ENCOUNTER — Encounter: Payer: Medicare Other | Attending: Specialist | Admitting: *Deleted

## 2019-12-31 ENCOUNTER — Other Ambulatory Visit: Payer: Self-pay

## 2019-12-31 DIAGNOSIS — J449 Chronic obstructive pulmonary disease, unspecified: Secondary | ICD-10-CM | POA: Diagnosis not present

## 2019-12-31 DIAGNOSIS — G4730 Sleep apnea, unspecified: Secondary | ICD-10-CM | POA: Insufficient documentation

## 2019-12-31 NOTE — Progress Notes (Signed)
Daily Session Note  Patient Details  Name: Mario [name] MRN: [id number] Date of Birth: [birth year] Referring Provider:     Pulmonary Rehab from 09/08/2019 in [facility] Cardiac and Pulmonary Rehab  Referring Provider [name]      Encounter Date: 12/31/2019  Check In:  Session Check In - 12/31/19 1035      Check-In   Supervising physician immediately available to respond to emergencies See telemetry face sheet for immediately available ER MD    Location [facility]-Cardiac & Pulmonary Rehab    Staff Present Heath Lark, RN, BSN, CCRP;Jessica [place], MA, RCEP, CCRP, CCET;Joseph [employer], [place], ACSM CEP, Exercise Physiologist    Virtual Visit No    Medication changes reported     No    Fall or balance concerns reported    No    Warm-up and Cool-down Performed on first and last piece of equipment    Resistance Training Performed Yes    VAD Patient? No    PAD/SET Patient? No      Pain Assessment   Currently in Pain? No/denies              Social History   Tobacco Use  Smoking Status Former Smoker  . Packs/day: 0.50  . Years: 1.00  . Pack years: 0.50  . Types: Cigarettes  . Quit date: 05/01/1969  . Years since quitting: 50.7  Smokeless Tobacco Never Used  Tobacco Comment   Quit 1971    Goals Met:  Proper associated with RPD/PD & O2 Sat Independence with exercise equipment Exercise tolerated well No report of cardiac concerns or symptoms  Goals Unmet:  Not Applicable  Comments: Pt able to follow exercise prescription today without complaint.  Will continue to monitor for progression.    Dr. Emily Filbert is Medical Director for [place] and [name].

## 2020-01-02 ENCOUNTER — Other Ambulatory Visit: Payer: Self-pay

## 2020-01-02 DIAGNOSIS — J449 Chronic obstructive pulmonary disease, unspecified: Secondary | ICD-10-CM | POA: Diagnosis not present

## 2020-01-02 NOTE — Progress Notes (Signed)
Daily Session Note  Patient Details  Name: Mario [name] MRN: [id number] Date of Birth: 1945-10-26 Referring Provider:     Pulmonary Rehab from 09/08/2019 in [facility] Cardiac and Pulmonary Rehab  Referring Provider Mario [name]      Encounter Date: 01/02/2020  Check In:  Session Check In - 01/02/20 0957      Check-In   Supervising physician immediately available to respond to emergencies See telemetry face sheet for immediately available ER MD    Location [facility]-Cardiac & Pulmonary Rehab    Staff Present Vida Rigger RN, BSN;Jessica Luan [place], MA, RCEP, CCRP, CCET;Melissa [place] RDN, LDN    Virtual Visit No    Medication changes reported     No    Fall or balance concerns reported    No    Warm-up and Cool-down Performed on first and last piece of equipment    Resistance Training Performed Yes    VAD Patient? No    PAD/SET Patient? No      Pain Assessment   Currently in Pain? No/denies              Social History   Tobacco Use  Smoking Status Former Smoker  . Packs/day: 0.50  . Years: 1.00  . Pack years: 0.50  . Types: Cigarettes  . Quit date: 05/01/1969  . Years since quitting: 50.7  Smokeless Tobacco Never Used  Tobacco Comment   Quit 1971    Goals Met:  Proper associated with RPD/PD & O2 Sat Independence with exercise equipment Exercise tolerated well No report of cardiac concerns or symptoms Strength training completed today  Goals Unmet:  Not Applicable  Comments: Pt able to follow exercise prescription today without complaint.  Will continue to monitor for progression.   Dr. Emily Filbert is Medical Director for [name] and [name].

## 2020-01-07 ENCOUNTER — Other Ambulatory Visit: Payer: Self-pay

## 2020-01-07 ENCOUNTER — Encounter: Payer: Self-pay | Admitting: *Deleted

## 2020-01-07 ENCOUNTER — Encounter: Payer: Medicare Other | Admitting: *Deleted

## 2020-01-07 DIAGNOSIS — J449 Chronic obstructive pulmonary disease, unspecified: Secondary | ICD-10-CM

## 2020-01-07 NOTE — Progress Notes (Signed)
Pulmonary Individual Treatment Plan  Patient Details  Name: Mario [name] MRN: [id number] Date of Birth: 1945-06-28 Referring Provider:     Pulmonary Rehab from 09/08/2019 in [facility] Cardiac and Pulmonary Rehab  Referring Provider [name]      Initial Encounter Date:    Pulmonary Rehab from 09/08/2019 in [facility] Cardiac and Pulmonary Rehab  Date 09/08/19      Visit Diagnosis: Chronic obstructive pulmonary disease, unspecified COPD type ([place])  Patient's Home Medications on Admission:  Current Outpatient Medications:  .  albuterol (PROVENTIL HFA;VENTOLIN HFA) 108 (90 Base) MCG/ACT inhaler, Inhale 2 puffs into the lungs as needed., Disp: , Rfl:  .  APPLE CIDER VINEGAR PO, Take by mouth daily., Disp: , Rfl:  .  celecoxib (CELEBREX) 200 MG capsule, , Disp: , Rfl:  .  DULoxetine (CYMBALTA) 60 MG capsule, am, Disp: , Rfl:  .  edoxaban (SAVAYSA) 60 MG TABS tablet, Take 60 mg by mouth daily. cardiology, Disp: , Rfl:  .  Fluticasone-Umeclidin-Vilant (TRELEGY ELLIPTA IN), Inhale into the lungs daily., Disp: , Rfl:  .  furosemide (LASIX) 80 MG tablet, Take 1 tablet by mouth 2 (two) times daily. 80 mg AM, 120 mg PM, Disp: , Rfl:  .  gabapentin (NEURONTIN) 300 MG capsule, Take 800 mg by mouth 3 (three) times daily. , Disp: , Rfl:  .  gabapentin (NEURONTIN) 800 MG tablet, Take 800 mg by mouth 3 (three) times daily., Disp: , Rfl:  .  glucose blood (ONE TOUCH ULTRA TEST) test strip, Use as instructed FOR TESTING three times daily.  E11.9, Disp: , Rfl:  .  ipratropium-albuterol (DUONEB) 0.5-2.5 (3) MG/3ML SOLN, Inhale into the lungs., Disp: , Rfl:  .  metFORMIN (GLUCOPHAGE) 1000 MG tablet, Take 1 tablet by mouth 2 (two) times daily., Disp: , Rfl:  .  metFORMIN (GLUCOPHAGE) 500 MG tablet, , Disp: , Rfl:  .  OZEMPIC, 1 MG/DOSE, 2 MG/1.5ML SOPN, , Disp: , Rfl:  .  ROCKLATAN 0.02-0.005 % SOLN, Apply 1 drop to eye at bedtime., Disp: , Rfl:  .  sotalol (BETAPACE) 80 MG tablet, Take 1 tablet by mouth 2 (two)  times daily., Disp: , Rfl:  .  SOTALOL AF 80 MG TABS, Take by mouth 2 (two) times daily. Breakfast and dinner, Disp: , Rfl:  .  traMADol (ULTRAM) 50 MG tablet, Take by mouth 3 (three) times daily., Disp: , Rfl:  .  TURMERIC PO, Take by mouth daily., Disp: , Rfl:   Past Medical History: Past Medical History:  Diagnosis Date  . Arthritis    hands, knees  . Atrial fibrillation ([place])   . CHF (congestive heart failure) ([place])   . COPD (chronic obstructive pulmonary disease) ([place])   . Diabetes mellitus without complication ([place])   . Diabetic neuropathy ([place])   . Heart murmur    in past, mild 1969  . Hypertension    resolved after weight loss and more activity  . Neuromuscular disorder ([facility])    neuropathy  . Neuropathy   . Pulmonary embolism ([place])    approx 2014  . Shingles '99  . Sleep apnea    CPAP  . Wears dentures    full upper and lower    Tobacco Use: Social History   Tobacco Use  Smoking Status Former Smoker  . Packs/day: 0.50  . Years: 1.00  . Pack years: 0.50  . Types: Cigarettes  . Quit date: 05/01/1969  . Years since quitting: 50.7  Smokeless Tobacco Never Used  Tobacco Comment  Quit 1971    Labs: Recent Chemical engineer    Labs for ITP Cardiac and Pulmonary Rehab Latest Ref Rng & Units 02/15/2016   Hemoglobin A1c 4.8 - 5.6 % 6.8(H)       Pulmonary Assessment Scores:  Pulmonary Assessment Scores    Row Name 09/08/19 1302 12/22/19 1008 12/31/19 1351     ADL UCSD   ADL Phase -- Exit Exit   SOB Score total 28 -- 39   Rest 0 -- 0   Walk 1 -- 3   Stairs 3 -- 3   Bath 0 -- 1   Dress 0 -- 1   Shop 1 -- 1     CAT Score   CAT Score 7 -- 16     mMRC Score   mMRC Score '2 2 2          ' UCSD: Self-administered rating of dyspnea associated with activities of daily living (ADLs) 6-point scale (0 = "not at all" to 5 = "maximal or unable to do because of breathlessness")  Scoring Scores range from 0 to 120.  Minimally important difference is 5  units  CAT: CAT can identify the health impairment of COPD patients and is better correlated with disease progression.  CAT has a scoring range of zero to 40. The CAT score is classified into four groups of low (less than 10), medium (10 - 20), high (21-30) and very high (31-40) based on the impact level of disease on health status. A CAT score over 10 suggests significant symptoms.  A worsening CAT score could be explained by an exacerbation, poor medication adherence, poor inhaler technique, or progression of COPD or comorbid conditions.  CAT MCID is 2 points  mMRC: mMRC (Modified Medical Research Council) Dyspnea Scale is used to assess the degree of baseline functional disability in patients of respiratory disease due to dyspnea. No minimal important difference is established. A decrease in score of 1 point or greater is considered a positive change.   Pulmonary Function Assessment:   Exercise Target Goals: Exercise Program Goal: Individual exercise prescription set using results from initial 6 min walk test and THRR while considering  patient's activity barriers and safety.   Exercise Prescription Goal: Initial exercise prescription builds to 30-45 minutes a day of aerobic activity, 2-3 days per week.  Home exercise guidelines will be given to patient during program as part of exercise prescription that the participant will acknowledge.  Education: Aerobic Exercise & Resistance Training: - Gives group verbal and written instruction on the various components of exercise. Focuses on aerobic and resistive training programs and the benefits of this training and how to safely progress through these programs..   Pulmonary Rehab from 01/07/2020 in [facility] Cardiac and Pulmonary Rehab  Date 12/17/19  Educator [facility]  Instruction Review Code 1- Verbalizes Understanding      Education: Exercise & Equipment Safety: - Individual verbal instruction and demonstration of equipment use and safety with use  of the equipment.   Pulmonary Rehab from 01/07/2020 in [facility] Cardiac and Pulmonary Rehab  Date 09/08/19  Educator AS  Instruction Review Code 1- Verbalizes Understanding      Education: Exercise Physiology & General Exercise Guidelines: - Group verbal and written instruction with models to review the exercise physiology of the cardiovascular system and associated critical values. Provides general exercise guidelines with specific guidelines to those with heart or lung disease.    Pulmonary Rehab from 01/07/2020 in [facility] Cardiac and Pulmonary Rehab  Date  11/12/19  Educator [place]  Instruction Review Code 1- Verbalizes Understanding      Education: Flexibility, Balance, Mind/Body Relaxation: Provides group verbal/written instruction on the benefits of flexibility and balance training, including mind/body exercise modes such as yoga, pilates and tai chi.  Demonstration and skill practice provided.   Pulmonary Rehab from 01/07/2020 in [facility] Cardiac and Pulmonary Rehab  Date 12/31/19  Educator AS  Instruction Review Code 1- Verbalizes Understanding      Activity Barriers & Risk Stratification:  Activity Barriers & Cardiac Risk Stratification - 09/05/19 1018      Activity Barriers & Cardiac Risk Stratification   Activity Barriers Joint Problems;Back Problems   Knees and lower back. Knees has been receiving injections.          6 Minute Walk:  6 Minute Walk    Row Name 09/08/19 1242 12/22/19 0937       6 Minute Walk   Phase -- Discharge    Distance 595 feet 777 feet    Distance % Change -- 30.6 %    Distance Feet Change -- 182 ft    Walk Time 4.1 minutes 6 minutes    # of Rest Breaks 1 0    MPH 1.65 1.47    METS 1.43 1.77    RPE 13 15    Perceived Dyspnea  1 3    VO2 Peak 5.01 6.19    Symptoms No Yes (comment)    Comments -- knee pain 10/10    Resting HR 90 bpm 72 bpm    Resting BP 114/74 128/74    Resting Oxygen Saturation  97 % 94 %    Exercise Oxygen Saturation  during 6 min  walk 80 % 83 %    Max Ex. HR 114 bpm 101 bpm    Max Ex. BP 136/84 154/74    2 Minute Post BP 126/74 132/74      Interval HR   1 Minute HR 103 91    2 Minute HR 106 100    3 Minute HR 104 101    4 Minute HR 103 98    5 Minute HR 105 99    6 Minute HR 114 96    2 Minute Post HR 108 88    Interval Heart Rate? Yes Yes      Interval Oxygen   Interval Oxygen? Yes Yes    Baseline Oxygen Saturation % 97 % 94 %    1 Minute Oxygen Saturation % 84 % 88 %    1 Minute Liters of Oxygen 2 L 3 L    2 Minute Oxygen Saturation % 81 % 85 %    2 Minute Liters of Oxygen 2 L 3 L    3 Minute Oxygen Saturation % 80 % 83 %    3 Minute Liters of Oxygen 2 L 3 L    4 Minute Oxygen Saturation % 88 % 86 %    4 Minute Liters of Oxygen 2 L 3 L    5 Minute Oxygen Saturation % 87 % 86 %    5 Minute Liters of Oxygen 2 L 3 L    6 Minute Oxygen Saturation % 84 % 85 %    6 Minute Liters of Oxygen 2 L 3 L    2 Minute Post Oxygen Saturation % 89 % 97 %    2 Minute Post Liters of Oxygen 2 L 3 L          Oxygen  Initial Assessment:  Oxygen Initial Assessment - 09/05/19 1019      Home Oxygen   Home Oxygen Device Home Concentrator;Portable Concentrator    Sleep Oxygen Prescription CPAP    Liters per minute 2    Home Exercise Oxygen Prescription Continuous    Liters per minute 2    Liters per minute 2   as needed   Compliance with Home Oxygen Use Yes      Intervention   Short Term Goals To learn and exhibit compliance with exercise, home and travel O2 prescription;To learn and understand importance of monitoring SPO2 with pulse oximeter and demonstrate accurate use of the pulse oximeter.;To learn and understand importance of maintaining oxygen saturations>88%;To learn and demonstrate proper pursed lip breathing techniques or other breathing techniques.;To learn and demonstrate proper use of respiratory medications    Long  Term Goals Exhibits compliance with exercise, home and travel O2 prescription;Verbalizes  importance of monitoring SPO2 with pulse oximeter and return demonstration;Maintenance of O2 saturations>88%;Exhibits proper breathing techniques, such as pursed lip breathing or other method taught during program session;Compliance with respiratory medication;Demonstrates proper use of MDI's           Oxygen Re-Evaluation:  Oxygen Re-Evaluation    Row Name 09/10/19 1130 10/01/19 0944 10/27/19 0941 11/26/19 0934 12/15/19 1003     Program Oxygen Prescription   Program Oxygen Prescription Continuous;E-Tanks Continuous;E-Tanks Continuous;E-Tanks Continuous;E-Tanks Continuous;E-Tanks   Liters per minute '4 2 2 2 ' --     Home Oxygen   Home Oxygen Device Home Concentrator;Portable Concentrator Home Concentrator;Portable Concentrator Home Concentrator;Portable Concentrator Home Concentrator;Portable Concentrator Home Concentrator;Portable Concentrator   Sleep Oxygen Prescription CPAP CPAP CPAP CPAP CPAP   Liters per minute '2 2 2 2 2   ' Home Exercise Oxygen Prescription Continuous Continuous Continuous Continuous Continuous   Liters per minute '2 2 2 2 2   ' Home at Rest Exercise Oxygen Prescription Continuous Continuous Continuous Continuous Continuous   Liters per minute '2 2 2 2 2   ' Compliance with Home Oxygen Use Yes Yes Yes No --     Goals/Expected Outcomes   Short Term Goals To learn and exhibit compliance with exercise, home and travel O2 prescription;To learn and understand importance of monitoring SPO2 with pulse oximeter and demonstrate accurate use of the pulse oximeter.;To learn and understand importance of maintaining oxygen saturations>88%;To learn and demonstrate proper pursed lip breathing techniques or other breathing techniques.;To learn and demonstrate proper use of respiratory medications To learn and exhibit compliance with exercise, home and travel O2 prescription;To learn and understand importance of monitoring SPO2 with pulse oximeter and demonstrate accurate use of the pulse  oximeter.;To learn and understand importance of maintaining oxygen saturations>88%;To learn and demonstrate proper pursed lip breathing techniques or other breathing techniques.;To learn and demonstrate proper use of respiratory medications To learn and exhibit compliance with exercise, home and travel O2 prescription;To learn and understand importance of monitoring SPO2 with pulse oximeter and demonstrate accurate use of the pulse oximeter.;To learn and understand importance of maintaining oxygen saturations>88%;To learn and demonstrate proper pursed lip breathing techniques or other breathing techniques.;To learn and demonstrate proper use of respiratory medications To learn and exhibit compliance with exercise, home and travel O2 prescription;To learn and understand importance of monitoring SPO2 with pulse oximeter and demonstrate accurate use of the pulse oximeter.;To learn and understand importance of maintaining oxygen saturations>88%;To learn and demonstrate proper pursed lip breathing techniques or other breathing techniques.;To learn and demonstrate proper use of respiratory medications To learn and exhibit  compliance with exercise, home and travel O2 prescription;To learn and understand importance of monitoring SPO2 with pulse oximeter and demonstrate accurate use of the pulse oximeter.;To learn and understand importance of maintaining oxygen saturations>88%;To learn and demonstrate proper pursed lip breathing techniques or other breathing techniques.;To learn and demonstrate proper use of respiratory medications   Long  Term Goals Exhibits compliance with exercise, home and travel O2 prescription;Verbalizes importance of monitoring SPO2 with pulse oximeter and return demonstration;Maintenance of O2 saturations>88%;Exhibits proper breathing techniques, such as pursed lip breathing or other method taught during program session;Compliance with respiratory medication;Demonstrates proper use of MDI's  Exhibits compliance with exercise, home and travel O2 prescription;Verbalizes importance of monitoring SPO2 with pulse oximeter and return demonstration;Maintenance of O2 saturations>88%;Exhibits proper breathing techniques, such as pursed lip breathing or other method taught during program session;Compliance with respiratory medication;Demonstrates proper use of MDI's Exhibits compliance with exercise, home and travel O2 prescription;Verbalizes importance of monitoring SPO2 with pulse oximeter and return demonstration;Maintenance of O2 saturations>88%;Exhibits proper breathing techniques, such as pursed lip breathing or other method taught during program session;Compliance with respiratory medication;Demonstrates proper use of MDI's Exhibits compliance with exercise, home and travel O2 prescription;Verbalizes importance of monitoring SPO2 with pulse oximeter and return demonstration;Maintenance of O2 saturations>88%;Exhibits proper breathing techniques, such as pursed lip breathing or other method taught during program session;Compliance with respiratory medication;Demonstrates proper use of MDI's Exhibits compliance with exercise, home and travel O2 prescription;Verbalizes importance of monitoring SPO2 with pulse oximeter and return demonstration;Maintenance of O2 saturations>88%;Exhibits proper breathing techniques, such as pursed lip breathing or other method taught during program session;Compliance with respiratory medication;Demonstrates proper use of MDI's   Comments Reviewed PLB technique with pt.  Talked about how it works and it's importance in maintaining their exercise saturations. Mario [name] is doing well in rehab.  He has improved his compliance for wearing his CPAP.  He does not always use his oxygen at home when he is resting as his concentrator is upstairs.  When he is active, he will wear it.  He does use his pulse oximeter at home to keep and eye on his oxygen level.  He notices differences as too  if he is hot or cold as the hot air makes it harder to breathe.  He is not using the PLB as much at home, but trying to use it more and uses it most when trying to recover his breathing. Mario [name] states he wears his CPAP but not all the time.  He can sit at the table without oxygen but uses it when he needs it if up and moving.  He has a pulse ox to check O2 at home. Mario [name] has not been wearing his CPAP as it bothers his nose.  He is going to get refitted.  He has stopped using his oxygen at rest but keeps it on while moving. Mario [name] hasnt had CPAP refitted yet.  He does monitor oxygen at home and uses it when needed.  He reports that it is 97% when at rest.   Goals/Expected Outcomes Short: Become more profiecient at using PLB.   Long: Become independent at using PLB. Short: Use PLB more  Long: Continued compliance. Short:  RT discussed importance of using CPAP Long:  compliance with all O2 prescriptions Short: Get refitted for CPAP  Long: Continue to use meds and improve breathing. Short: get refitted for CPAP Long:  continue to use O2 as directed          Oxygen Discharge (Final Oxygen Re-Evaluation):  Oxygen Re-Evaluation - 12/15/19 1003      Program Oxygen Prescription   Program Oxygen Prescription Continuous;E-Tanks      Home Oxygen   Home Oxygen Device Home Concentrator;Portable Concentrator    Sleep Oxygen Prescription CPAP    Liters per minute 2    Home Exercise Oxygen Prescription Continuous    Liters per minute 2    Home at Rest Exercise Oxygen Prescription Continuous    Liters per minute 2      Goals/Expected Outcomes   Short Term Goals To learn and exhibit compliance with exercise, home and travel O2 prescription;To learn and understand importance of monitoring SPO2 with pulse oximeter and demonstrate accurate use of the pulse oximeter.;To learn and understand importance of maintaining oxygen saturations>88%;To learn and demonstrate proper pursed lip breathing techniques or other breathing  techniques.;To learn and demonstrate proper use of respiratory medications    Long  Term Goals Exhibits compliance with exercise, home and travel O2 prescription;Verbalizes importance of monitoring SPO2 with pulse oximeter and return demonstration;Maintenance of O2 saturations>88%;Exhibits proper breathing techniques, such as pursed lip breathing or other method taught during program session;Compliance with respiratory medication;Demonstrates proper use of MDI's    Comments Mario [name] hasnt had CPAP refitted yet.  He does monitor oxygen at home and uses it when needed.  He reports that it is 97% when at rest.    Goals/Expected Outcomes Short: get refitted for CPAP Long:  continue to use O2 as directed           Initial Exercise Prescription:  Initial Exercise Prescription - 09/08/19 1200      Date of Initial Exercise RX and Referring Provider   Date 09/08/19    Referring Provider [name]      Treadmill   MPH 1.5    Grade 0    Minutes 15      Recumbant Bike   Level 1    RPM 60    Minutes 15    METs 1.5      NuStep   Level 1    SPM 80    Minutes 15    METs 1.5      REL-XR   Level 1    Speed 50    Minutes 15    METs 1.5      Prescription Details   Frequency (times per week) 3    Duration Progress to 30 minutes of continuous aerobic without signs/symptoms of physical distress      Intensity   THRR 40-80% of Max Heartrate 113-136    Ratings of Perceived Exertion 11-15    Perceived Dyspnea 0-4      Resistance Training   Training Prescription Yes    Weight 3 lb    Reps 10-15           Perform Capillary Blood Glucose checks as needed.  Exercise Prescription Changes:  Exercise Prescription Changes    Row Name 09/08/19 1200 09/15/19 1400 09/30/19 1500 10/15/19 1400 10/29/19 0900     Response to Exercise   Blood Pressure (Admit) 114/74 140/80 120/80 130/82 114/68   Blood Pressure (Exercise) 136/84 160/82 142/60 122/82 142/80   Blood Pressure (Exit) 126/74 150/85  112/68 122/70 118/74   Heart Rate (Admit) 90 bpm 89 bpm 82 bpm 90 bpm 91 bpm   Heart Rate (Exercise) 114 bpm 112 bpm 97 bpm 109 bpm 103 bpm   Heart Rate (Exit) 108 bpm 91 bpm 97 bpm 87 bpm 96 bpm   Oxygen Saturation (Admit)  97 % 91 % 98 % 95 % 97 %   Oxygen Saturation (Exercise) 80 % 90 % 96 % 92 % 95 %   Oxygen Saturation (Exit) 89 % 96 % 99 % 97 % 98 %   Rating of Perceived Exertion (Exercise) '13 11 15 15 11   ' Perceived Dyspnea (Exercise) 1 -- '2 1 1   ' Symptoms none none none none none   Duration -- Continue with 30 min of aerobic exercise without signs/symptoms of physical distress. Continue with 30 min of aerobic exercise without signs/symptoms of physical distress. Continue with 30 min of aerobic exercise without signs/symptoms of physical distress. Continue with 30 min of aerobic exercise without signs/symptoms of physical distress.   Intensity -- THRR unchanged THRR unchanged THRR unchanged THRR unchanged     Progression   Progression -- Continue to progress workloads to maintain intensity without signs/symptoms of physical distress. Continue to progress workloads to maintain intensity without signs/symptoms of physical distress. Continue to progress workloads to maintain intensity without signs/symptoms of physical distress. Continue to progress workloads to maintain intensity without signs/symptoms of physical distress.   Average METs -- 3.6 2.7 2.87 2.25     Resistance Training   Training Prescription -- Yes Yes Yes Yes   Weight -- 3 lb 3 lb 5 lb 5 lb   Reps -- 10-15 10-15 10-15 10-15     Interval Training   Interval Training -- No No No No     Oxygen   Oxygen -- -- -- Continuous Continuous   Liters -- -- -- 3 3     Treadmill   MPH -- -- -- 1 1   Grade -- -- -- 0.5 0.5   Minutes -- -- -- 15 15   METs -- -- -- 1.83 1.83     Recumbant Bike   Level -- -- 1 5 --   RPM -- -- 60 -- --   Watts -- -- -- 30 --   Minutes -- -- 15 15 --   METs -- -- 2.1 2.1 --     NuStep    Level -- '2 4 4 ' --   SPM -- -- 80 -- --   Minutes -- '15 15 15 ' --   METs -- 3.1 3.3 2.7 --     REL-XR   Level -- 1 -- -- --   Minutes -- 15 -- -- --   METs -- 4.1 -- -- --     T5 Nustep   Level -- -- -- 1 1   SPM -- -- -- -- 80   Minutes -- -- -- 15 15   METs -- -- -- 1.9 1.9     Home Exercise Plan   Plans to continue exercise at -- -- -- Home (comment)  walking Home (comment)  walking   Frequency -- -- -- Add 2 additional days to program exercise sessions. Add 2 additional days to program exercise sessions.   Initial Home Exercises Provided -- -- -- 10/20/19 10/20/19   Row Name 11/10/19 1300 11/25/19 1300 12/23/19 1500 01/06/20 1500       Response to Exercise   Blood Pressure (Admit) 142/78 124/84 128/72 122/74    Blood Pressure (Exercise) 142/76 130/86 154/74 158/90    Blood Pressure (Exit) 122/80 102/60 110/70 114/60    Heart Rate (Admit) 98 bpm 79 bpm 84 bpm 74 bpm    Heart Rate (Exercise) 103 bpm 91 bpm 101 bpm 97 bpm    Heart Rate (  Exit) 101 bpm 89 bpm 87 bpm 77 bpm    Oxygen Saturation (Admit) 92 % 98 % 94 % 97 %    Oxygen Saturation (Exercise) 95 % 94 % 92 % 93 %    Oxygen Saturation (Exit) 98 % 98 % 97 % 98 %    Rating of Perceived Exertion (Exercise) '13 13 15 15    ' Perceived Dyspnea (Exercise) 0 0 3 2    Symptoms none none SOB SOB    Duration Continue with 30 min of aerobic exercise without signs/symptoms of physical distress. Continue with 30 min of aerobic exercise without signs/symptoms of physical distress. Continue with 30 min of aerobic exercise without signs/symptoms of physical distress. Continue with 30 min of aerobic exercise without signs/symptoms of physical distress.    Intensity THRR unchanged THRR unchanged THRR unchanged THRR unchanged      Progression   Progression Continue to progress workloads to maintain intensity without signs/symptoms of physical distress. Continue to progress workloads to maintain intensity without signs/symptoms of physical  distress. Continue to progress workloads to maintain intensity without signs/symptoms of physical distress. Continue to progress workloads to maintain intensity without signs/symptoms of physical distress.    Average METs 2.1 2.5 2 2.15      Resistance Training   Training Prescription Yes Yes Yes Yes    Weight 6 lb 6 lb 6 lb 6 lb    Reps 10-15 10-15 10-15 10-15      Interval Training   Interval Training No No -- --      Oxygen   Oxygen Continuous Continuous Continuous Continuous    Liters '3 3 3 3      ' Treadmill   MPH -- -- -- 1.5    Grade -- -- -- 0.5    Minutes -- -- -- 15    METs -- -- -- 2.25      Recumbant Bike   Level 5 1.5 -- 2    Watts 39 -- -- --    Minutes 15 15 -- 15    METs 3.1 2.5 -- --      NuStep   Level 4 4 -- 1    SPM -- 80 -- --    Minutes 15 15 -- 15    METs 3 2.4 -- 2.1      Arm Ergometer   Level 1 -- -- --    Minutes 15 -- -- --    METs 2.2 -- -- --      T5 Nustep   Level -- -- 1 1    SPM -- -- 80 --    Minutes -- -- 15 15    METs -- -- 2 2.1      Home Exercise Plan   Plans to continue exercise at Home (comment)  walking -- -- Home (comment)  walking    Frequency Add 2 additional days to program exercise sessions. -- -- Add 2 additional days to program exercise sessions.    Initial Home Exercises Provided 10/20/19 -- -- 10/20/19           Exercise Comments:  Exercise Comments    Row Name 09/05/19 1049           Exercise Comments His knees may inhibit his exercise function. He is getting injections in his knees and the injections do help his pain. He still has pain "under my kneecap" He is hoping this exercise will help with the knee pain. We did talk about PT for  his knees. His physician was going to order PT.              Exercise Goals and Review:  Exercise Goals    Row Name 09/08/19 1300             Exercise Goals   Increase Physical Activity Yes       Intervention Provide advice, education, support and counseling  about physical activity/exercise needs.;Develop an individualized exercise prescription for aerobic and resistive training based on initial evaluation findings, risk stratification, comorbidities and participant's personal goals.       Expected Outcomes Short Term: Attend rehab on a regular basis to increase amount of physical activity.;Long Term: Add in home exercise to make exercise part of routine and to increase amount of physical activity.;Long Term: Exercising regularly at least 3-5 days a week.       Increase Strength and Stamina Yes       Intervention Provide advice, education, support and counseling about physical activity/exercise needs.;Develop an individualized exercise prescription for aerobic and resistive training based on initial evaluation findings, risk stratification, comorbidities and participant's personal goals.       Expected Outcomes Short Term: Increase workloads from initial exercise prescription for resistance, speed, and METs.;Short Term: Perform resistance training exercises routinely during rehab and add in resistance training at home;Long Term: Improve cardiorespiratory fitness, muscular endurance and strength as measured by increased METs and functional capacity (6MWT)       Able to understand and use rate of perceived exertion (RPE) scale Yes       Intervention Provide education and explanation on how to use RPE scale       Expected Outcomes Short Term: Able to use RPE daily in rehab to express subjective intensity level;Long Term:  Able to use RPE to guide intensity level when exercising independently       Able to understand and use Dyspnea scale Yes       Intervention Provide education and explanation on how to use Dyspnea scale       Expected Outcomes Short Term: Able to use Dyspnea scale daily in rehab to express subjective sense of shortness of breath during exertion;Long Term: Able to use Dyspnea scale to guide intensity level when exercising independently        Knowledge and understanding of Target Heart Rate Range (THRR) Yes       Intervention Provide education and explanation of THRR including how the numbers were predicted and where they are located for reference       Expected Outcomes Short Term: Able to state/look up THRR;Short Term: Able to use daily as guideline for intensity in rehab;Long Term: Able to use THRR to govern intensity when exercising independently       Able to check pulse independently Yes       Intervention Provide education and demonstration on how to check pulse in carotid and radial arteries.;Review the importance of being able to check your own pulse for safety during independent exercise       Expected Outcomes Short Term: Able to explain why pulse checking is important during independent exercise;Long Term: Able to check pulse independently and accurately       Understanding of Exercise Prescription Yes       Intervention Provide education, explanation, and written materials on patient's individual exercise prescription       Expected Outcomes Short Term: Able to explain program exercise prescription;Long Term: Able to explain home exercise prescription to exercise independently  Exercise Goals Re-Evaluation :  Exercise Goals Re-Evaluation    Row Name 09/10/19 1129 09/15/19 1352 09/30/19 1525 10/01/19 0950 10/15/19 1415     Exercise Goal Re-Evaluation   Exercise Goals Review Increase Physical Activity;Able to understand and use rate of perceived exertion (RPE) scale;Knowledge and understanding of Target Heart Rate Range (THRR);Understanding of Exercise Prescription;Increase Strength and Stamina;Able to check pulse independently;Able to understand and use Dyspnea scale Increase Physical Activity;Increase Strength and Stamina;Understanding of Exercise Prescription Increase Physical Activity;Increase Strength and Stamina;Understanding of Exercise Prescription Increase Physical Activity;Increase Strength and  Stamina;Understanding of Exercise Prescription Increase Physical Activity;Increase Strength and Stamina;Understanding of Exercise Prescription   Comments Reviewed RPE and dyspnea scales, THR and program prescription with pt today.  Pt voiced understanding and was given a copy of goals to take home. Mario [name] is off to a good start in rehab.   He has completed his first three full days of exercise and blood sugars have been good pre and post.  He is still hesistant on the treadmill, but we will continue to work with him through it.  We will continue to monitor his progress. Mario [name] has increased workloads on NS and XR.  Staff will continue to work with him on walking. Mario [name] is off to a good start in rehab.  He works hard in class and has started to use his recumbent bike at home while watching TV.  He will do it for 7mn at home.  We talked about adding in more time at home.  Updated home exercise with pt today.  Pt plans to walk and use bike at home for exercise.  Reviewed THR, pulse, RPE, sign and symptoms, pulse oximetery and when to call 911 or MD.  Also discussed weather considerations and indoor options.  Pt voiced understanding. LNydia Boutonis doing well in rehab.  He is up to level 5 on the recumbent bike.  We will continue to monitor his progress.   Expected Outcomes Short: Use RPE daily to regulate intensity. Long: Follow program prescription in THR. Short: Talk about walking to and from rehab.  Long: Continue to follow program prescription and adhere to good attendance Short: work on walking Long:  increase overall MET level and stamina Short: Start to increase exercise time at home to 30 min Long: Continue to improve stamina. Short: Increase T5 workload Long: Continue to improve stamina.   RHeimdalName 10/29/19 0957 11/10/19 1302 11/25/19 1331 11/26/19 0926 12/09/19 1519     Exercise Goal Re-Evaluation   Exercise Goals Review Increase Physical Activity;Increase Strength and Stamina;Understanding of Exercise  Prescription Increase Physical Activity;Increase Strength and Stamina;Understanding of Exercise Prescription Increase Physical Activity;Increase Strength and Stamina;Understanding of Exercise Prescription Increase Physical Activity;Increase Strength and Stamina;Understanding of Exercise Prescription --   Comments LNydia Boutonhas tried the TM at 1.0 mph/.5 incline.  Staff will encourage him to continue to work on TM. LNydia Boutonhas been doing well in rehab.  He prefers not to be on the treadmill due to his knee.  He is up to level 5 on the bike.  We will continue to monitor his progress. Mario [name] attends consistently and his oxygen has stayed in the 90s for exercise.  He is up to 6 lbfor strength work. LNydia Boutonis doing well in rehab.  He is feeling stronger overall.  He is exercising at home on his off days in the garage with fans. Out since last review   Expected Outcomes Short:  build stamina on TM Long: improve overall MET level Short:  Ensure he is maintainin at least 80 spm on steppers  Long: Continue to improve stamina. Short: continue to attend consistently Long:  improve overall MET level Short: Continue to exercise on his off days Long: Continue to exercise on off days. --   [place] Name 12/15/19 0957 12/23/19 1541 01/06/20 1537         Exercise Goal Re-Evaluation   Exercise Goals Review Increase Physical Activity;Increase Strength and Stamina;Understanding of Exercise Prescription Increase Physical Activity;Increase Strength and Stamina;Understanding of Exercise Prescription Increase Physical Activity;Increase Strength and Stamina;Understanding of Exercise Prescription     Comments Mario [name] swimsat [employer] on days not at [facility].  He feels very good working out in the pool. Lanis knee pain keeps him from progressing as much on machines.  Staff will encourag ehim to do what he can and work on Materials engineer. Mario [name] will be graduating this week!  He improved his post 6MWT by 190f!!  He is planning to continue to exericse by  going to [employer] walking.     Expected Outcomes Short:  exercise consistently Long: continue to build stamina Short:  attend and exercise consistetly Long:  build overall stamina Graduate and continue to exercise independently            Discharge Exercise Prescription (Final Exercise Prescription Changes):  Exercise Prescription Changes - 01/06/20 1500      Response to Exercise   Blood Pressure (Admit) 122/74    Blood Pressure (Exercise) 158/90    Blood Pressure (Exit) 114/60    Heart Rate (Admit) 74 bpm    Heart Rate (Exercise) 97 bpm    Heart Rate (Exit) 77 bpm    Oxygen Saturation (Admit) 97 %    Oxygen Saturation (Exercise) 93 %    Oxygen Saturation (Exit) 98 %    Rating of Perceived Exertion (Exercise) 15    Perceived Dyspnea (Exercise) 2    Symptoms SOB    Duration Continue with 30 min of aerobic exercise without signs/symptoms of physical distress.    Intensity THRR unchanged      Progression   Progression Continue to progress workloads to maintain intensity without signs/symptoms of physical distress.    Average METs 2.15      Resistance Training   Training Prescription Yes    Weight 6 lb    Reps 10-15      Oxygen   Oxygen Continuous    Liters 3      Treadmill   MPH 1.5    Grade 0.5    Minutes 15    METs 2.25      Recumbant Bike   Level 2    Minutes 15      NuStep   Level 1    Minutes 15    METs 2.1      T5 Nustep   Level 1    Minutes 15    METs 2.1      Home Exercise Plan   Plans to continue exercise at Home (comment)   walking   Frequency Add 2 additional days to program exercise sessions.    Initial Home Exercises Provided 10/20/19           Nutrition:  Target Goals: Understanding of nutrition guidelines, daily intake of sodium <15014m cholesterol <20048mcalories 30% from fat and 7% or less from saturated fats, daily to have 5 or more servings of fruits and vegetables.  Education: Controlling Sodium/Reading Food  Labels -Group verbal and written material supporting the discussion  of sodium use in heart healthy nutrition. Review and explanation with models, verbal and written materials for utilization of the food label.   Pulmonary Rehab from 08/15/2017 in [facility] Cardiac and Pulmonary Rehab  Date 07/30/17  Educator CR  Instruction Review Code 1- [employer] Understanding      Education: General Nutrition Guidelines/Fats and Fiber: -Group instruction provided by verbal, written material, models and posters to present the general guidelines for heart healthy nutrition. Gives an explanation and review of dietary fats and fiber.   Pulmonary Rehab from 01/07/2020 in [facility] Cardiac and Pulmonary Rehab  Date 01/07/20  Educator [facility]  Instruction Review Code 1- Verbalizes Understanding      Biometrics:  Pre Biometrics - 09/08/19 1301      Pre Biometrics   Height 5' 9.5" (1.765 m)    Weight 221 lb 4.8 oz (100.4 kg)    BMI (Calculated) 32.22           Post Biometrics - 12/22/19 0939       Post  Biometrics   Height 5' 9.5" (1.765 m)    Weight 219 lb 11.2 oz (99.7 kg)    BMI (Calculated) 31.99           Nutrition Therapy Plan and Nutrition Goals:  Nutrition Therapy & Goals - 10/13/19 0953      Nutrition Therapy   Diet heart healthy, low Na, diabetic    Drug/Food Interactions Statins/Certain Fruits    Protein (specify units) 120g    Fiber 30 grams    Whole Grain Foods 3 servings    Saturated Fats 12 max. grams    Fruits and Vegetables 5 servings/day    Sodium 1.5 grams      Personal Nutrition Goals   Nutrition Goal ST: does not want to make short term goals at this time LT: wants to be able to walk without having assistance, now on 3L of O2 when exercising and 2L when not active; SOB affects activities of daily living  - needs to take breaks.    Comments 5.7 most recent A1C. B: Toast (potato bread - jelly and butter) and coffee - starbucks coffee (latte). S: bananas and other fruit D: spaghetti,  chicken, sweet potatos, greens, and other vegetables; no salt, pt doesn't know what girlfriend cooks with S: chocolate pretzles, ice cream (gets one that has less sugar)  Drinks water all the time, during and in between meals. Lower appetite, sometimes doesn't eat at all. Discussed heart healthy eating and pulmonary MNT. Pt A1c 5.6 - eats many "naked" and simple CHOs, likely low A1C due to lower food consumption. Pt reports not eating much and sometimes does not eat at all. Pt also reports weight loss.      Intervention Plan   Intervention Prescribe, educate and counsel regarding individualized specific dietary modifications aiming towards targeted core components such as weight, hypertension, lipid management, diabetes, heart failure and other comorbidities.;Nutrition handout(s) given to patient.    Expected Outcomes Short Term Goal: Understand basic principles of dietary content, such as calories, fat, sodium, cholesterol and nutrients.;Short Term Goal: A plan has been developed with personal nutrition goals set during dietitian appointment.;Long Term Goal: Adherence to prescribed nutrition plan.           Nutrition Assessments:  Nutrition Assessments - 12/31/19 1352      MEDFICTS Scores   Pre Score 40    Post Score 81    Score Difference 41  MEDIFICTS Score Key:          ?70 Need to make dietary changes          40-70 Heart Healthy Diet         ? 40 Therapeutic Level Cholesterol Diet  Nutrition Goals Re-Evaluation:  Nutrition Goals Re-Evaluation    [place] Name 10/27/19 [id number] 11/26/19 0932 12/15/19 0927         Goals   Nutrition Goal ST: Mario [name] is reducing portion sizes to lose weight Long:  continue to exercise and eat smaller protions ST: Mario [name] is reducing portion sizes to lose weight Long:  continue to exercise and eat smaller protions ST: high calorie, high protein MNT LT: gain muscle, stop muscle wasting.     Comment -- Mario [name] is doing well in rehab.  He is eating smaller  portions and snacks throughout the day.  He is also getting in protein shakes.  His protein is now where he wants it to be. His iron levels were a little low so we talked about ways to boost that again. shows signs of moderate to sever muscle depletion, pt noticed losing significant muscle as well and is fatigued and has a hard time standing; pt shows no signs of fat loss. Discussed weight loss is not recommended in older adults and his COPD increasing his protein and calorie needs further. Pt reports having little food during the day and eating mostly vegetables and rice bowls with meat, chicken, or shrimp. Suspect pt is malnourished, unable to confirm without a full NFPE. Discussed high calorie and high protein MNT.     Expected Outcome -- Short: Build iron back up wiht vit C Long; Continue to eat better. ST: high calorie, high protein MNT LT: gain muscle, stop muscle wasting.            Nutrition Goals Discharge (Final Nutrition Goals Re-Evaluation):  Nutrition Goals Re-Evaluation - 12/15/19 9826      Goals   Nutrition Goal ST: high calorie, high protein MNT LT: gain muscle, stop muscle wasting.    Comment shows signs of moderate to sever muscle depletion, pt noticed losing significant muscle as well and is fatigued and has a hard time standing; pt shows no signs of fat loss. Discussed weight loss is not recommended in older adults and his COPD increasing his protein and calorie needs further. Pt reports having little food during the day and eating mostly vegetables and rice bowls with meat, chicken, or shrimp. Suspect pt is malnourished, unable to confirm without a full NFPE. Discussed high calorie and high protein MNT.    Expected Outcome ST: high calorie, high protein MNT LT: gain muscle, stop muscle wasting.           Psychosocial: Target Goals: Acknowledge presence or absence of significant depression and/or stress, maximize coping skills, provide positive support system. Participant is  able to verbalize types and ability to use techniques and skills needed for reducing stress and depression.   Education: Depression - Provides group verbal and written instruction on the correlation between heart/lung disease and depressed mood, treatment options, and the stigmas associated with seeking treatment.   Education: Sleep Hygiene -Provides group verbal and written instruction about how sleep can affect your health.  Define sleep hygiene, discuss sleep cycles and impact of sleep habits. Review good sleep hygiene tips.    Pulmonary Rehab from 08/15/2017 in [facility] Cardiac and Pulmonary Rehab  Date 08/15/17  Educator [facility]  Instruction Review Code 1- Verbalizes Understanding  Education: Stress and Anxiety: - Provides group verbal and written instruction about the health risks of elevated stress and causes of high stress.  Discuss the correlation between heart/lung disease and anxiety and treatment options. Review healthy ways to manage with stress and anxiety.   Pulmonary Rehab from 08/15/2017 in [facility] Cardiac and Pulmonary Rehab  Date 07/04/17  Educator [facility]  Instruction Review Code 1- Verbalizes Understanding      Initial Review & Psychosocial Screening:  Initial Psych Review & Screening - 09/05/19 1028      Initial Review   Current issues with Current Stress Concerns    Source of Stress Concerns Unable to perform yard/household activities;Unable to participate in former interests or hobbies;Chronic Illness    Comments COPD is well controlled, was told his lung tests are worse.  his knees prevent him from participating with daily chores      [place]? Yes   Girlfriend Mario [name], Mario [name]'s sister in [place],  [place] in [place] and [place] Girlfriend Mario [name], Mario [name]'s sister in [place],  [place] in [place] and [place]      Barriers   Psychosocial barriers to participate in program There are no identifiable barriers or psychosocial needs.;The patient should  benefit from training in stress management and relaxation.      Screening Interventions   Interventions Encouraged to exercise;To provide support and resources with identified psychosocial needs;Provide feedback about the scores to participant    Expected Outcomes Short Term goal: Utilizing psychosocial counselor, staff and physician to assist with identification of specific Stressors or current issues interfering with healing process. Setting desired goal for each stressor or current issue identified.;Long Term Goal: Stressors or current issues are controlled or eliminated.;Short Term goal: Identification and review with participant of any Quality of Life or Depression concerns found by scoring the questionnaire.;Long Term goal: The participant improves quality of Life and PHQ9 Scores as seen by post scores and/or verbalization of changes           Quality of Life Scores:  Scores of 19 and below usually indicate a poorer quality of life in these areas.  A difference of  2-3 points is a clinically meaningful difference.  A difference of 2-3 points in the total score of the Quality of Life Index has been associated with significant improvement in overall quality of life, self-image, physical symptoms, and general health in studies assessing change in quality of life.  PHQ-9: Recent Review Flowsheet Data    Depression screen [facility] 2/9 12/31/2019 11/26/2019 10/22/2019 10/01/2019 09/08/2019   Decreased Interest '2 2 1 2 3    ' Down, Depressed, Hopeless 1 1 0 1 2   PHQ - 2 Score '3 3 1 3 5   ' Altered sleeping 2 1 0 1 3    Tired, decreased energy '3 3 2 3 3    ' Change in appetite 0 0 0 0 0   Feeling bad or failure about yourself  2 0 0 0 0   Trouble concentrating '2 1 1 1 1   ' Moving slowly or fidgety/restless 0 0 0 0 1   Suicidal thoughts 0 0 0 0 0   PHQ-9 Score '12 8 4 8 13   ' Difficult doing work/chores Not difficult at all Somewhat difficult Somewhat difficult Not difficult at all Somewhat difficult      Interpretation of Total Score  Total Score Depression Severity:  1-4 = Minimal depression, 5-9 = Mild depression, 10-14 = Moderate depression, 15-19 =  Moderately severe depression, 20-27 = Severe depression   Psychosocial Evaluation and Intervention:  Psychosocial Evaluation - 09/05/19 1041      Psychosocial Evaluation & Interventions   Interventions Encouraged to exercise with the program and follow exercise prescription    Comments Mario [name] has no barriers to returning to the program after over a year. He stated that he has not been doing his usual exercise regimen due to the [place] PHE. He is glad to have the referral so he can get back to a routine of exercise . He lives with his girlfrient Mario [name] (calls her Mario [name]) and 2 dogs. He has a great support system with Mario [name]. Other support is Mario [name]'s sister in [place], who is a Marine scientist and his children that live in [place] and [place]. His knees may inhibit his exercise function. He is getting injections in his knees and the injections do help his pain. He still has pain "under my kneecap" He is hoping this exercise will help with the knee pain. We did talk about PT for his knees. His physician was going to order PT. Mario [name] should do well with the program    Expected Outcomes STG: Mario [name] will benefit from consistent exercise to achieve his stated goals.  LTG Mario [name] will after discharge be able to continue with his exercise to maintain his progress    Continue Psychosocial Services  Follow up required by staff           Psychosocial Re-Evaluation:  Psychosocial Re-Evaluation    [place] Name 10/01/19 [id number] 10/22/19 1041 10/27/19 0938 11/26/19 0928 12/15/19 0956     Psychosocial Re-Evaluation   Current issues with Current Depression;Current Stress Concerns;Current Sleep Concerns Current Depression;Current Stress Concerns;Current Sleep Concerns Current Depression;Current Stress Concerns;Current Sleep Concerns Current Depression;Current Stress Concerns;Current Sleep Concerns  Current Depression;Current Stress Concerns;Current Sleep Concerns   Comments Mario [name] is doing well in rehab. His PHQ score has improved from 13 to 8!  He is feeling better getting back into an exercise routine. He is also feleing like his attitude has gotten better.  He is now on a mission to lose weight.  He is using his CPAP again and helping him sleep better. Mario [name] is doing well in rehab. His PHQ score has improved from 13 to 8 and now 8 to 4!  He is using his CPAP again and helping him sleep better. Mario [name] reports getting support from his sisters and that right now he isn't doing any therapy. Mario [name] reports that he feels well and doesn't feel depressed. -- Mario [name] is doing well in rehab.  He enjoys coming to class as an outlet.  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  His PHQ has creeped back up after all this again.  He continues to struggle with sleep.  He is trying to stay positive.  His favorite things are his dogs as they bring him the most joy. --   Expected Outcomes Short: Continue with new attitude. Long: Continue to stay positive. Short: Continue with new attitude. Long: Continue to stay positive. -- Short: Continue to exercise to get mental boost Long: Continue to stay positive --   Interventions Encouraged to attend Pulmonary Rehabilitation for the exercise;Stress management education Encouraged to attend Pulmonary Rehabilitation for the exercise;Stress management education -- Encouraged to attend Pulmonary Rehabilitation for the exercise;Stress management education --   Continue Psychosocial Services  Follow up required by staff Follow up required by staff -- Follow up required by  staff --   Comments -- COPD is well controlled, was told his lung tests are worse.  his knees prevent him from participating with daily chores -- -- --     Initial Review   Source of Stress Concerns -- Unable to perform yard/household activities;Unable to participate in  former interests or hobbies;Chronic Illness -- -- --          Psychosocial Discharge (Final Psychosocial Re-Evaluation):  Psychosocial Re-Evaluation - 12/15/19 0956      Psychosocial Re-Evaluation   Current issues with Current Depression;Current Stress Concerns;Current Sleep Concerns           Education: Education Goals: Education classes will be provided on a weekly basis, covering required topics. Participant will state understanding/return demonstration of topics presented.  Learning Barriers/Preferences:  Learning Barriers/Preferences - 09/05/19 1034      Learning Barriers/Preferences   Learning Barriers None    Learning Preferences None           General Pulmonary Education Topics:  Infection Prevention: - Provides verbal and written material to individual with discussion of infection control including proper hand washing and proper equipment cleaning during exercise session.   Pulmonary Rehab from 01/07/2020 in [facility] Cardiac and Pulmonary Rehab  Date 09/08/19  Educator AS  Instruction Review Code 1- Verbalizes Understanding      Falls Prevention: - Provides verbal and written material to individual with discussion of falls prevention and safety.   Pulmonary Rehab from 01/07/2020 in [facility] Cardiac and Pulmonary Rehab  Date 09/08/19  Educator AS  Instruction Review Code 1- Verbalizes Understanding      Chronic Lung Diseases: - Group verbal and written instruction to review updates, respiratory medications, advancements in procedures and treatments. Discuss use of supplemental oxygen including available portable oxygen systems, continuous and intermittent flow rates, concentrators, personal use and safety guidelines. Review proper use of inhaler and spacers. Provide informative websites for self-education.    Pulmonary Rehab from 08/15/2017 in [facility] Cardiac and Pulmonary Rehab  Date 07/25/17  Educator [facility]  Instruction Review Code 1- Verbalizes Understanding       Energy Conservation: - Provide group verbal and written instruction for methods to conserve energy, plan and organize activities. Instruct on pacing techniques, use of adaptive equipment and posture/positioning to relieve shortness of breath.   Pulmonary Rehab from 08/15/2017 in [facility] Cardiac and Pulmonary Rehab  Date 06/27/17  Educator [facility]  Instruction Review Code 1- Verbalizes Understanding      Triggers and Exacerbations: - Group verbal and written instruction to review types of environmental triggers and ways to prevent exacerbations. Discuss weather changes, air quality and the benefits of nasal washing. Review warning signs and symptoms to help prevent infections. Discuss techniques for effective airway clearance, coughing, and vibrations.   Pulmonary Rehab from 05/03/2016 in [facility] Cardiac and Pulmonary Rehab  Date 04/26/16  Educator LB  Instruction Review Code (retired) 2- meets goals/outcomes      AED/CPR: - Group verbal and written instruction with the use of models to demonstrate the basic use of the AED with the basic ABC's of resuscitation.   Pulmonary Rehab from 08/15/2017 in [facility] Cardiac and Pulmonary Rehab  Date 06/29/17  Educator [facility]  Instruction Review Code 1- Actuary and Physiology of the Lungs: - Group verbal and written instruction with the use of models to provide basic lung anatomy and physiology related to function, structure and complications of lung disease.   Pulmonary Rehab from 08/15/2017 in [facility]  Cardiac and Pulmonary Rehab  Date 07/11/17  Educator [facility]  Instruction Review Code 1- Verbalizes Understanding      Anatomy & Physiology of the Heart: - Group verbal and written instruction and models provide basic cardiac anatomy and physiology, with the coronary electrical and arterial systems. Review of Valvular disease and Heart Failure   Cardiac Medications: - Group verbal and written instruction to review commonly prescribed  medications for heart disease. Reviews the medication, class of the drug, and side effects.   Pulmonary Rehab from 01/07/2020 in [facility] Cardiac and Pulmonary Rehab  Date 11/26/19  Educator SB  Instruction Review Code 1- Verbalizes Understanding      Other: -Provides group and verbal instruction on various topics (see comments)   Pulmonary Rehab from 08/15/2017 in [facility] Cardiac and Pulmonary Rehab  Date 05/23/17  Educator [facility]  Instruction Review Code 1- Verbalizes Understanding  [SLEEP]      Knowledge Questionnaire Score:  Knowledge Questionnaire Score - 12/31/19 1352      Knowledge Questionnaire Score   Pre Score 16/18    Post Score 16/18            Core Components/Risk Factors/Patient Goals at Admission:  Personal Goals and Risk Factors at Admission - 09/08/19 1314      Core Components/Risk Factors/Patient Goals on Admission    Weight Management Weight Loss;Yes    Intervention Weight Management: Develop a combined nutrition and exercise program designed to reach desired caloric intake, while maintaining appropriate intake of nutrient and fiber, sodium and fats, and appropriate energy expenditure required for the weight goal.;Weight Management: Provide education and appropriate resources to help participant work on and attain dietary goals.;Weight Management/Obesity: Establish reasonable short term and long term weight goals.;Obesity: Provide education and appropriate resources to help participant work on and attain dietary goals.    Admit Weight 221 lb 4.8 oz (100.4 kg)    Goal Weight: Short Term 215 lb (97.5 kg)    Goal Weight: Long Term 200 lb (90.7 kg)    Expected Outcomes Short Term: Continue to assess and modify interventions until short term weight is achieved;Long Term: Adherence to nutrition and physical activity/exercise program aimed toward attainment of established weight goal;Weight Maintenance: Understanding of the daily nutrition guidelines, which includes 25-35% calories  from fat, 7% or less cal from saturated fats, less than 253m cholesterol, less than 1.5gm of sodium, & 5 or more servings of fruits and vegetables daily;Weight Loss: Understanding of general recommendations for a balanced deficit meal plan, which promotes 1-2 lb weight loss per week and includes a negative energy balance of [phone] kcal/d    Diabetes Yes    Intervention Provide education about signs/symptoms and action to take for hypo/hyperglycemia.;Provide education about proper nutrition, including hydration, and aerobic/resistive exercise prescription along with prescribed medications to achieve blood glucose in normal ranges: Fasting glucose 65-99 mg/dL    Expected Outcomes Short Term: Participant verbalizes understanding of the signs/symptoms and immediate care of hyper/hypoglycemia, proper foot care and importance of medication, aerobic/resistive exercise and nutrition plan for blood glucose control.;Long Term: Attainment of HbA1C < 7%.    Intervention Provide a combined exercise and nutrition program that is supplemented with education, support and counseling about heart failure. Directed toward relieving symptoms such as shortness of breath, decreased exercise tolerance, and extremity edema.    Expected Outcomes Improve functional capacity of life;Short term: Attendance in program 2-3 days a week with increased exercise capacity. Reported lower sodium intake. Reported increased fruit and vegetable intake. Reports  medication compliance.;Short term: Daily weights obtained and reported for increase. Utilizing diuretic protocols set by physician.;Long term: Adoption of self-care skills and reduction of barriers for early signs and symptoms recognition and intervention leading to self-care maintenance.    Intervention Provide education on lifestyle modifcations including regular physical activity/exercise, weight management, moderate sodium restriction and increased consumption of fresh fruit,  vegetables, and low fat dairy, alcohol moderation, and smoking cessation.;Monitor prescription use compliance.    Expected Outcomes Short Term: Continued assessment and intervention until BP is < 140/62m HG in hypertensive participants. < 130/86mHG in hypertensive participants with diabetes, heart failure or chronic kidney disease.;Long Term: Maintenance of blood pressure at goal levels.           Education:Diabetes - Individual verbal and written instruction to review signs/symptoms of diabetes, desired ranges of glucose level fasting, after meals and with exercise. Acknowledge that pre and post exercise glucose checks will be done for 3 sessions at entry of program.   [facility]  Date 04/04/16  Educator LB  Instruction Review Code (retired) 2- meets goals/outcomes      Education: Know Your Numbers and Risk Factors: -Group verbal and written instruction about important numbers in your health.  Discussion of what are risk factors and how they play a role in the disease process.  Review of Cholesterol, Blood Pressure, Diabetes, and BMI and the role they play in your overall health.   [facility]  Date 08/10/17  [facility] Code 1- Verbalizes Understanding      Core Components/Risk Factors/Patient Goals Review:   Goals and Risk Factor Review    Row Name 10/01/19 [id number]/28/21 0934 11/26/19 0936 12/15/19 0953       Core Components/Risk Factors/Patient Goals Review   Personal Goals Review Weight Management/Obesity;Improve shortness of breath with ADL's;Heart Failure;Hypertension Weight Management/Obesity;Improve shortness of breath with ADL's;Heart Failure;Hypertension Weight Management/Obesity;Improve shortness of breath with ADL's;Heart Failure;Hypertension Weight Management/Obesity;Improve shortness of breath with ADL's;Heart Failure;Hypertension     Review LaNydia Boutons doing well in rehab.  He is now on a new mission to lose weight and get under 200 lb.  His breathing is getting better and he does not need his oxygen at rest at home.  He denies heart failure symptoms currently and saw TiOtila Kluverecently and got a good report.  He is scheduled for an echo on June 16.  Blood pressures have been good and he does check them at home. Mario [name] wants to lose 15 more lb.  He is exercising here and cutting down postion sizes.  Mario [name] feels his SOB has improved since he has started to use better breathing techniques.  He talks to his Dr today about the echo. Mario [name] continues to work on weight loss.  His goal is to get to 200 lb before he graduates. He is doing better with is breathing at rest, just when he is working hard.  He is feeling better overall.  He is doing well with his blood pressures and he checks them at home regularly.  He is planning to meet with pain management after vacation next week.  He denies  any heart failure symptoms. Mario [name] reoprts taking all meds as directed.  He does monitor BP at home.  He monitors BG - normally between 113-125 fasting.  He monitors O2 and uses supplemental when he is active.    Expected Outcomes Short: Continue to  work on weight loss Long: Continue to improve breathing using PLB. Short: continue to exercise and eat better Long: continue to practice breathing techniques Short: Continue to work on weight loss Long: Continue to montior risk factors. Short: continue to monitor risk factors Long: maintain risk factors at optimal levels           Core Components/Risk Factors/Patient Goals at Discharge (Final Review):   Goals and Risk Factor Review - 12/15/19 0953      Core Components/Risk Factors/Patient Goals Review   Personal Goals Review Weight Management/Obesity;Improve shortness of breath with ADL's;Heart Failure;Hypertension    Review Mario [name] reoprts taking all meds as directed.  He does monitor BP at home.  He monitors BG - normally  between 113-125 fasting.  He monitors O2 and uses supplemental when he is active.    Expected Outcomes Short: continue to monitor risk factors Long: maintain risk factors at optimal levels           ITP Comments:  ITP Comments    Row Name 09/05/19 1004 09/08/19 1312 09/10/19 1129 09/17/19 0533 10/15/19 0548   ITP Comments Virtual Orientation completed today. Exercise evaluation scheduled for Monday 5/10 with gym orientation. Documentation for diagnosis can be found in [facility] 4/21. Completed 6MWT and gym orientation.  Initial ITP created and sent for review to Dr. Emily Filbert, Medical Director. First full day of exercise!  Patient was oriented to gym and equipment including functions, settings, policies, and procedures.  Patient's individual exercise prescription and treatment plan were reviewed.  All starting workloads were established based on the results of the 6 minute walk test done at initial orientation visit.  The plan for exercise progression was also introduced and progression will be customized based on patient's performance and goals. 30 Day review completed. ITP review done, changes made as directed,and approval shown by signature of  Scientist, research (life sciences).  New to program 30 Day review completed. Medical Director ITP review done, changes made as directed, and signed approval by Medical Director.   [place] Name 11/12/19 0631 12/09/19 1519 12/10/19 0541 01/07/20 1156     ITP Comments 30 Day review completed. Medical Director ITP review done, changes made as directed, and signed approval by Medical Director. Pt out on vacation 30 Day review completed. Medical Director ITP review done, changes made as directed, and signed approval by Medical Director. 30 day review completed. ITP sent to Dr. Emily Filbert, Medical Director of Cardiac and Pulmonary Rehab. Continue with ITP unless changes are made by physician.           Comments: 30 day review

## 2020-01-07 NOTE — Progress Notes (Signed)
Daily Session Note  Patient Details  Name: Mario [name] MRN: [id number] Date of Birth: [birth year] Referring Provider:     Pulmonary Rehab from 09/08/[facility] in [facility] Cardiac and Pulmonary Rehab  Referring Provider [name]      Encounter Date: 01/07/2020  Check In:  Session Check In - 01/07/20 0942      Check-In   Supervising physician immediately available to respond to emergencies See telemetry face sheet for immediately available ER MD    Location [facility]-Cardiac & Pulmonary Rehab    Staff Present Renita Papa, RN BSN;Joseph Hood RCP,RRT,BSRT;Melissa Brownfields RDN, Rowe Pavy, [place], ACSM CEP, Exercise Physiologist    Virtual Visit No    Medication changes reported     No    Fall or balance concerns reported    No    Warm-up and Cool-down Performed on first and last piece of equipment    Resistance Training Performed Yes    VAD Patient? No    PAD/SET Patient? No      Pain Assessment   Currently in Pain? No/denies              Social History   Tobacco Use  Smoking Status Former Smoker  . Packs/day: 0.50  . Years: 1.00  . Pack years: 0.50  . Types: Cigarettes  . Quit date: 05/01/1969  . Years since quitting: 50.7  Smokeless Tobacco Never Used  Tobacco Comment   Quit 1971    Goals Met:  Independence with exercise equipment Exercise tolerated well No report of cardiac concerns or symptoms Strength training completed today  Goals Unmet:  Not Applicable  Comments: Pt able to follow exercise prescription today without complaint.  Will continue to monitor for progression.    Dr. Emily Filbert is Medical Director for [name].

## 2020-01-08 NOTE — Patient Instructions (Signed)
Discharge Patient Instructions  Patient Details  Name: Mario [name] MRN: [id number] Date of Birth: [birth year] Referring Provider:  Sallee Lange, *   Number of Visits: 36  Reason for Discharge:  Patient reached a stable level of exercise. Patient independent in their exercise. Patient has met program and personal goals.  Smoking History:  Social History   Tobacco Use  Smoking Status Former Smoker  . Packs/day: 0.50  . Years: 1.00  . Pack years: 0.50  . Types: Cigarettes  . Quit date: 05/01/1969  . Years since quitting: 50.7  Smokeless Tobacco Never Used  Tobacco Comment   Quit 1971    Diagnosis:  Chronic obstructive pulmonary disease, unspecified COPD type ([place])  Initial Exercise Prescription:  Initial Exercise Prescription - 09/08/19 1200      Date of Initial Exercise RX and Referring Provider   Date 09/08/19    Referring Provider [name]      Treadmill   MPH 1.5    Grade 0    Minutes 15      Recumbant Bike   Level 1    RPM 60    Minutes 15    METs 1.5      NuStep   Level 1    SPM 80    Minutes 15    METs 1.5      REL-XR   Level 1    Speed 50    Minutes 15    METs 1.5      Prescription Details   Frequency (times per week) 3    Duration Progress to 30 minutes of continuous aerobic without signs/symptoms of physical distress      Intensity   THRR 40-80% of Max Heartrate 113-136    Ratings of Perceived Exertion 11-15    Perceived Dyspnea 0-4      Resistance Training   Training Prescription Yes    Weight 3 lb    Reps 10-15           Discharge Exercise Prescription (Final Exercise Prescription Changes):  Exercise Prescription Changes - 01/06/20 1500      Response to Exercise   Blood Pressure (Admit) 122/74    Blood Pressure (Exercise) 158/90    Blood Pressure (Exit) 114/60    Heart Rate (Admit) 74 bpm    Heart Rate (Exercise) 97 bpm    Heart Rate (Exit) 77 bpm    Oxygen Saturation (Admit) 97 %    Oxygen Saturation  (Exercise) 93 %    Oxygen Saturation (Exit) 98 %    Rating of Perceived Exertion (Exercise) 15    Perceived Dyspnea (Exercise) 2    Symptoms SOB    Duration Continue with 30 min of aerobic exercise without signs/symptoms of physical distress.    Intensity THRR unchanged      Progression   Progression Continue to progress workloads to maintain intensity without signs/symptoms of physical distress.    Average METs 2.15      Resistance Training   Training Prescription Yes    Weight 6 lb    Reps 10-15      Oxygen   Oxygen Continuous    Liters 3      Treadmill   MPH 1.5    Grade 0.5    Minutes 15    METs 2.25      Recumbant Bike   Level 2    Minutes 15      NuStep   Level 1    Minutes 15  METs 2.1      T5 Nustep   Level 1    Minutes 15    METs 2.1      Home Exercise Plan   Plans to continue exercise at Home (comment)   walking   Frequency Add 2 additional days to program exercise sessions.    Initial Home Exercises Provided 10/20/19           Functional Capacity:  6 Minute Walk    Row Name 09/08/19 1242 12/22/19 0937       6 Minute Walk   Phase -- Discharge    Distance 595 feet 777 feet    Distance % Change -- 30.6 %    Distance Feet Change -- 182 ft    Walk Time 4.1 minutes 6 minutes    # of Rest Breaks 1 0    MPH 1.65 1.47    METS 1.43 1.77    RPE 13 15    Perceived Dyspnea  1 3    VO2 Peak 5.01 6.19    Symptoms No Yes (comment)    Comments -- knee pain 10/10    Resting HR 90 bpm 72 bpm    Resting BP 114/74 128/74    Resting Oxygen Saturation  97 % 94 %    Exercise Oxygen Saturation  during 6 min walk 80 % 83 %    Max Ex. HR 114 bpm 101 bpm    Max Ex. BP 136/84 154/74    2 Minute Post BP 126/74 132/74      Interval HR   1 Minute HR 103 91    2 Minute HR 106 100    3 Minute HR 104 101    4 Minute HR 103 98    5 Minute HR 105 99    6 Minute HR 114 96    2 Minute Post HR 108 88    Interval Heart Rate? Yes Yes      Interval Oxygen    Interval Oxygen? Yes Yes    Baseline Oxygen Saturation % 97 % 94 %    1 Minute Oxygen Saturation % 84 % 88 %    1 Minute Liters of Oxygen 2 L 3 L    2 Minute Oxygen Saturation % 81 % 85 %    2 Minute Liters of Oxygen 2 L 3 L    3 Minute Oxygen Saturation % 80 % 83 %    3 Minute Liters of Oxygen 2 L 3 L    4 Minute Oxygen Saturation % 88 % 86 %    4 Minute Liters of Oxygen 2 L 3 L    5 Minute Oxygen Saturation % 87 % 86 %    5 Minute Liters of Oxygen 2 L 3 L    6 Minute Oxygen Saturation % 84 % 85 %    6 Minute Liters of Oxygen 2 L 3 L    2 Minute Post Oxygen Saturation % 89 % 97 %    2 Minute Post Liters of Oxygen 2 L 3 L         Nutrition & Weight - Outcomes:  Pre Biometrics - 09/08/19 1301      Pre Biometrics   Height 5' 9.5" (1.765 m)    Weight 221 lb 4.8 oz (100.4 kg)    BMI (Calculated) 32.22           Post Biometrics - 12/22/19 6226       Post  Biometrics   Height 5' 9.5" (1.765 m)    Weight 219 lb 11.2 oz (99.7 kg)    BMI (Calculated) 31.99           Nutrition:  Nutrition Therapy & Goals - 10/13/19 0953      Nutrition Therapy   Diet heart healthy, low Na, diabetic    Drug/Food Interactions Statins/Certain Fruits    Protein (specify units) 120g    Fiber 30 grams    Whole Grain Foods 3 servings    Saturated Fats 12 max. grams    Fruits and Vegetables 5 servings/day    Sodium 1.5 grams      Personal Nutrition Goals   Nutrition Goal ST: does not want to make short term goals at this time LT: wants to be able to walk without having assistance, now on 3L of O2 when exercising and 2L when not active; SOB affects activities of daily living  - needs to take breaks.    Comments 5.7 most recent A1C. B: Toast (potato bread - jelly and butter) and coffee - [employer] coffee (latte). S: bananas and other fruit D: spaghetti, chicken, sweet potatos, greens, and other vegetables; no salt, pt doesn't know what girlfriend cooks with S: chocolate pretzles, ice cream (gets  one that has less sugar)  Drinks water all the time, during and in between meals. Lower appetite, sometimes doesn't eat at all. Discussed heart healthy eating and pulmonary MNT. Pt A1c 5.6 - eats many "naked" and simple CHOs, likely low A1C due to lower food consumption. Pt reports not eating much and sometimes does not eat at all. Pt also reports weight loss.      Intervention Plan   Intervention Prescribe, educate and counsel regarding individualized specific dietary modifications aiming towards targeted core components such as weight, hypertension, lipid management, diabetes, heart failure and other comorbidities.;Nutrition handout(s) given to patient.    Expected Outcomes Short Term Goal: Understand basic principles of dietary content, such as calories, fat, sodium, cholesterol and nutrients.;Short Term Goal: A plan has been developed with personal nutrition goals set during dietitian appointment.;Long Term Goal: Adherence to prescribed nutrition plan.           Nutrition Discharge:  Nutrition Assessments - 12/31/19 1352      MEDFICTS Scores   Pre Score 40    Post Score 81    Score Difference 41           Education Questionnaire Score:  Knowledge Questionnaire Score - 12/31/19 1352      Knowledge Questionnaire Score   Pre Score 16/18    Post Score 16/18           Goals reviewed with patient; copy given to patient.

## 2020-01-09 ENCOUNTER — Encounter: Payer: Medicare Other | Admitting: *Deleted

## 2020-01-09 ENCOUNTER — Other Ambulatory Visit: Payer: Self-pay

## 2020-01-09 DIAGNOSIS — J449 Chronic obstructive pulmonary disease, unspecified: Secondary | ICD-10-CM

## 2020-01-09 NOTE — Progress Notes (Signed)
Daily Session Note  Patient Details  Name: Mario [name] MRN: [id number] Date of Birth: [birth year] Referring Provider:     Pulmonary Rehab from 09/08/2019 in [facility]  Referring Provider Mario [name]      Encounter Date: 01/09/2020  Check In:  Session Check In - 01/09/20 0935      Check-In   Supervising physician immediately available to respond to emergencies See telemetry face sheet for immediately available ER MD    Location [facility]-Cardiac & Pulmonary Rehab    Staff Present Mario Lark, RN, BSN, CCRP;Mario Mullan, MA, RCEP, CCRP, CCET;Mario [name] RCP,RRT,BSRT    Virtual Visit No    Medication changes reported     No    Fall or balance concerns reported    No    Warm-up and Cool-down Performed on first and last piece of equipment    Resistance Training Performed Yes    VAD Patient? No    PAD/SET Patient? No      Pain Assessment   Currently in Pain? No/denies              Social History   Tobacco Use  Smoking Status Former Smoker  . Packs/day: 0.50  . Years: 1.00  . Pack years: 0.50  . Types: Cigarettes  . Quit date: 05/01/1969  . Years since quitting: 50.7  Smokeless Tobacco Never Used  Tobacco Comment   Quit 1971    Goals Met:  Proper associated with RPD/PD & O2 Sat Independence with exercise equipment Exercise tolerated well No report of cardiac concerns or symptoms  Goals Unmet:  Not Applicable  Comments:  Mario [name] graduated today from  rehab with 36 sessions completed.  Details of the patient's exercise prescription and what He needs to do in order to continue the prescription and progress were discussed with patient.  Patient was given a copy of prescription and goals.  Patient verbalized understanding.  Harl plans to continue to exercise by walking at home,possibly joining a gym when pandemic ends.    Dr. Emily [name] is Medical Director for [place] [facility] and [name].

## 2020-01-09 NOTE — Progress Notes (Signed)
Pulmonary Individual Treatment Plan  Patient Details  Name: Cordero Surette MRN: [id number] Date of Birth: 05-06-1945 Referring Provider:     Pulmonary Rehab from 09/08/2019 in [facility] Cardiac and Pulmonary Rehab  Referring Provider [name]      Initial Encounter Date:    Pulmonary Rehab from 09/08/2019 in [facility] Cardiac and Pulmonary Rehab  Date 09/08/19      Visit Diagnosis: Chronic obstructive pulmonary disease, unspecified COPD type ([place])  Patient's Home Medications on Admission:  Current Outpatient Medications:  .  albuterol (PROVENTIL HFA;VENTOLIN HFA) 108 (90 Base) MCG/ACT inhaler, Inhale 2 puffs into the lungs as needed., Disp: , Rfl:  .  APPLE CIDER VINEGAR PO, Take by mouth daily., Disp: , Rfl:  .  celecoxib (CELEBREX) 200 MG capsule, , Disp: , Rfl:  .  DULoxetine (CYMBALTA) 60 MG capsule, am, Disp: , Rfl:  .  edoxaban (SAVAYSA) 60 MG TABS tablet, Take 60 mg by mouth daily. cardiology, Disp: , Rfl:  .  Fluticasone-Umeclidin-Vilant (TRELEGY ELLIPTA IN), Inhale into the lungs daily., Disp: , Rfl:  .  furosemide (LASIX) 80 MG tablet, Take 1 tablet by mouth 2 (two) times daily. 80 mg AM, 120 mg PM, Disp: , Rfl:  .  gabapentin (NEURONTIN) 300 MG capsule, Take 800 mg by mouth 3 (three) times daily. , Disp: , Rfl:  .  gabapentin (NEURONTIN) 800 MG tablet, Take 800 mg by mouth 3 (three) times daily., Disp: , Rfl:  .  glucose blood (ONE TOUCH ULTRA TEST) test strip, Use as instructed FOR TESTING three times daily.  E11.9, Disp: , Rfl:  .  ipratropium-albuterol (DUONEB) 0.5-2.5 (3) MG/3ML SOLN, Inhale into the lungs., Disp: , Rfl:  .  metFORMIN (GLUCOPHAGE) 1000 MG tablet, Take 1 tablet by mouth 2 (two) times daily., Disp: , Rfl:  .  metFORMIN (GLUCOPHAGE) 500 MG tablet, , Disp: , Rfl:  .  OZEMPIC, 1 MG/DOSE, 2 MG/1.5ML SOPN, , Disp: , Rfl:  .  ROCKLATAN 0.02-0.005 % SOLN, Apply 1 drop to eye at bedtime., Disp: , Rfl:  .  sotalol (BETAPACE) 80 MG tablet, Take 1 tablet by mouth 2 (two)  times daily., Disp: , Rfl:  .  SOTALOL AF 80 MG TABS, Take by mouth 2 (two) times daily. Breakfast and dinner, Disp: , Rfl:  .  traMADol (ULTRAM) 50 MG tablet, Take by mouth 3 (three) times daily., Disp: , Rfl:  .  TURMERIC PO, Take by mouth daily., Disp: , Rfl:   Past Medical History: Past Medical History:  Diagnosis Date  . Arthritis    hands, knees  . Atrial fibrillation ([place])   . CHF (congestive heart failure) ([place])   . COPD (chronic obstructive pulmonary disease) ([place])   . Diabetes mellitus without complication ([place])   . Diabetic neuropathy ([place])   . Heart murmur    in past, mild 1969  . Hypertension    resolved after weight loss and more activity  . Neuromuscular disorder ([facility])    neuropathy  . Neuropathy   . Pulmonary embolism ([place])    approx 2014  . Shingles '99  . Sleep apnea    CPAP  . Wears dentures    full upper and lower    Tobacco Use: Social History   Tobacco Use  Smoking Status Former Smoker  . Packs/day: 0.50  . Years: 1.00  . Pack years: 0.50  . Types: Cigarettes  . Quit date: 05/01/1969  . Years since quitting: 50.7  Smokeless Tobacco Never Used  Tobacco Comment  Quit 1971    Labs: Recent Chemical engineer    Labs for ITP Cardiac and Pulmonary Rehab Latest Ref Rng & Units 02/15/2016   Hemoglobin A1c 4.8 - 5.6 % 6.8(H)       Pulmonary Assessment Scores:  Pulmonary Assessment Scores    Row Name 09/08/19 1302 12/22/19 1008 12/31/19 1351     ADL UCSD   ADL Phase -- Exit Exit   SOB Score total 28 -- 39   Rest 0 -- 0   Walk 1 -- 3   Stairs 3 -- 3   Bath 0 -- 1   Dress 0 -- 1   Shop 1 -- 1     CAT Score   CAT Score 7 -- 16     mMRC Score   mMRC Score '2 2 2          ' UCSD: Self-administered rating of dyspnea associated with activities of daily living (ADLs) 6-point scale (0 = "not at all" to 5 = "maximal or unable to do because of breathlessness")  Scoring Scores range from 0 to 120.  Minimally important difference is 5  units  CAT: CAT can identify the health impairment of COPD patients and is better correlated with disease progression.  CAT has a scoring range of zero to 40. The CAT score is classified into four groups of low (less than 10), medium (10 - 20), high (21-30) and very high (31-40) based on the impact level of disease on health status. A CAT score over 10 suggests significant symptoms.  A worsening CAT score could be explained by an exacerbation, poor medication adherence, poor inhaler technique, or progression of COPD or comorbid conditions.  CAT MCID is 2 points  mMRC: mMRC (Modified Medical Research Council) Dyspnea Scale is used to assess the degree of baseline functional disability in patients of respiratory disease due to dyspnea. No minimal important difference is established. A decrease in score of 1 point or greater is considered a positive change.   Pulmonary Function Assessment:   Exercise Target Goals: Exercise Program Goal: Individual exercise prescription set using results from initial 6 min walk test and THRR while considering  patient's activity barriers and safety.   Exercise Prescription Goal: Initial exercise prescription builds to 30-45 minutes a day of aerobic activity, 2-3 days per week.  Home exercise guidelines will be given to patient during program as part of exercise prescription that the participant will acknowledge.  Education: Aerobic Exercise & Resistance Training: - Gives group verbal and written instruction on the various components of exercise. Focuses on aerobic and resistive training programs and the benefits of this training and how to safely progress through these programs..   Pulmonary Rehab from 01/07/2020 in [facility] Cardiac and Pulmonary Rehab  Date 12/17/19  Educator [facility]  Instruction Review Code 1- Verbalizes Understanding      Education: Exercise & Equipment Safety: - Individual verbal instruction and demonstration of equipment use and safety with use  of the equipment.   Pulmonary Rehab from 01/07/2020 in [facility] Cardiac and Pulmonary Rehab  Date 09/08/19  Educator AS  Instruction Review Code 1- Verbalizes Understanding      Education: Exercise Physiology & General Exercise Guidelines: - Group verbal and written instruction with models to review the exercise physiology of the cardiovascular system and associated critical values. Provides general exercise guidelines with specific guidelines to those with heart or lung disease.    Pulmonary Rehab from 01/07/2020 in [facility] Cardiac and Pulmonary Rehab  Date  11/12/19  Educator [place]  Instruction Review Code 1- Verbalizes Understanding      Education: Flexibility, Balance, Mind/Body Relaxation: Provides group verbal/written instruction on the benefits of flexibility and balance training, including mind/body exercise modes such as yoga, pilates and tai chi.  Demonstration and skill practice provided.   Pulmonary Rehab from 01/07/2020 in [facility] Cardiac and Pulmonary Rehab  Date 12/31/19  Educator AS  Instruction Review Code 1- Verbalizes Understanding      Activity Barriers & Risk Stratification:  Activity Barriers & Cardiac Risk Stratification - 09/05/19 1018      Activity Barriers & Cardiac Risk Stratification   Activity Barriers Joint Problems;Back Problems   Knees and lower back. Knees has been receiving injections.          6 Minute Walk:  6 Minute Walk    Row Name 09/08/19 1242 12/22/19 0937       6 Minute Walk   Phase -- Discharge    Distance 595 feet 777 feet    Distance % Change -- 30.6 %    Distance Feet Change -- 182 ft    Walk Time 4.1 minutes 6 minutes    # of Rest Breaks 1 0    MPH 1.65 1.47    METS 1.43 1.77    RPE 13 15    Perceived Dyspnea  1 3    VO2 Peak 5.01 6.19    Symptoms No Yes (comment)    Comments -- knee pain 10/10    Resting HR 90 bpm 72 bpm    Resting BP 114/74 128/74    Resting Oxygen Saturation  97 % 94 %    Exercise Oxygen Saturation  during 6 min  walk 80 % 83 %    Max Ex. HR 114 bpm 101 bpm    Max Ex. BP 136/84 154/74    2 Minute Post BP 126/74 132/74      Interval HR   1 Minute HR 103 91    2 Minute HR 106 100    3 Minute HR 104 101    4 Minute HR 103 98    5 Minute HR 105 99    6 Minute HR 114 96    2 Minute Post HR 108 88    Interval Heart Rate? Yes Yes      Interval Oxygen   Interval Oxygen? Yes Yes    Baseline Oxygen Saturation % 97 % 94 %    1 Minute Oxygen Saturation % 84 % 88 %    1 Minute Liters of Oxygen 2 L 3 L    2 Minute Oxygen Saturation % 81 % 85 %    2 Minute Liters of Oxygen 2 L 3 L    3 Minute Oxygen Saturation % 80 % 83 %    3 Minute Liters of Oxygen 2 L 3 L    4 Minute Oxygen Saturation % 88 % 86 %    4 Minute Liters of Oxygen 2 L 3 L    5 Minute Oxygen Saturation % 87 % 86 %    5 Minute Liters of Oxygen 2 L 3 L    6 Minute Oxygen Saturation % 84 % 85 %    6 Minute Liters of Oxygen 2 L 3 L    2 Minute Post Oxygen Saturation % 89 % 97 %    2 Minute Post Liters of Oxygen 2 L 3 L          Oxygen  Initial Assessment:  Oxygen Initial Assessment - 09/05/19 1019      Home Oxygen   Home Oxygen Device Home Concentrator;Portable Concentrator    Sleep Oxygen Prescription CPAP    Liters per minute 2    Home Exercise Oxygen Prescription Continuous    Liters per minute 2    Liters per minute 2   as needed   Compliance with Home Oxygen Use Yes      Intervention   Short Term Goals To learn and exhibit compliance with exercise, home and travel O2 prescription;To learn and understand importance of monitoring SPO2 with pulse oximeter and demonstrate accurate use of the pulse oximeter.;To learn and understand importance of maintaining oxygen saturations>88%;To learn and demonstrate proper pursed lip breathing techniques or other breathing techniques.;To learn and demonstrate proper use of respiratory medications    Long  Term Goals Exhibits compliance with exercise, home and travel O2 prescription;Verbalizes  importance of monitoring SPO2 with pulse oximeter and return demonstration;Maintenance of O2 saturations>88%;Exhibits proper breathing techniques, such as pursed lip breathing or other method taught during program session;Compliance with respiratory medication;Demonstrates proper use of MDI's           Oxygen Re-Evaluation:  Oxygen Re-Evaluation    Row Name 09/10/19 1130 10/01/19 0944 10/27/19 0941 11/26/19 0934 12/15/19 1003     Program Oxygen Prescription   Program Oxygen Prescription Continuous;E-Tanks Continuous;E-Tanks Continuous;E-Tanks Continuous;E-Tanks Continuous;E-Tanks   Liters per minute '4 2 2 2 ' --     Home Oxygen   Home Oxygen Device Home Concentrator;Portable Concentrator Home Concentrator;Portable Concentrator Home Concentrator;Portable Concentrator Home Concentrator;Portable Concentrator Home Concentrator;Portable Concentrator   Sleep Oxygen Prescription CPAP CPAP CPAP CPAP CPAP   Liters per minute '2 2 2 2 2   ' Home Exercise Oxygen Prescription Continuous Continuous Continuous Continuous Continuous   Liters per minute '2 2 2 2 2   ' Home at Rest Exercise Oxygen Prescription Continuous Continuous Continuous Continuous Continuous   Liters per minute '2 2 2 2 2   ' Compliance with Home Oxygen Use Yes Yes Yes No --     Goals/Expected Outcomes   Short Term Goals To learn and exhibit compliance with exercise, home and travel O2 prescription;To learn and understand importance of monitoring SPO2 with pulse oximeter and demonstrate accurate use of the pulse oximeter.;To learn and understand importance of maintaining oxygen saturations>88%;To learn and demonstrate proper pursed lip breathing techniques or other breathing techniques.;To learn and demonstrate proper use of respiratory medications To learn and exhibit compliance with exercise, home and travel O2 prescription;To learn and understand importance of monitoring SPO2 with pulse oximeter and demonstrate accurate use of the pulse  oximeter.;To learn and understand importance of maintaining oxygen saturations>88%;To learn and demonstrate proper pursed lip breathing techniques or other breathing techniques.;To learn and demonstrate proper use of respiratory medications To learn and exhibit compliance with exercise, home and travel O2 prescription;To learn and understand importance of monitoring SPO2 with pulse oximeter and demonstrate accurate use of the pulse oximeter.;To learn and understand importance of maintaining oxygen saturations>88%;To learn and demonstrate proper pursed lip breathing techniques or other breathing techniques.;To learn and demonstrate proper use of respiratory medications To learn and exhibit compliance with exercise, home and travel O2 prescription;To learn and understand importance of monitoring SPO2 with pulse oximeter and demonstrate accurate use of the pulse oximeter.;To learn and understand importance of maintaining oxygen saturations>88%;To learn and demonstrate proper pursed lip breathing techniques or other breathing techniques.;To learn and demonstrate proper use of respiratory medications To learn and exhibit  compliance with exercise, home and travel O2 prescription;To learn and understand importance of monitoring SPO2 with pulse oximeter and demonstrate accurate use of the pulse oximeter.;To learn and understand importance of maintaining oxygen saturations>88%;To learn and demonstrate proper pursed lip breathing techniques or other breathing techniques.;To learn and demonstrate proper use of respiratory medications   Long  Term Goals Exhibits compliance with exercise, home and travel O2 prescription;Verbalizes importance of monitoring SPO2 with pulse oximeter and return demonstration;Maintenance of O2 saturations>88%;Exhibits proper breathing techniques, such as pursed lip breathing or other method taught during program session;Compliance with respiratory medication;Demonstrates proper use of MDI's  Exhibits compliance with exercise, home and travel O2 prescription;Verbalizes importance of monitoring SPO2 with pulse oximeter and return demonstration;Maintenance of O2 saturations>88%;Exhibits proper breathing techniques, such as pursed lip breathing or other method taught during program session;Compliance with respiratory medication;Demonstrates proper use of MDI's Exhibits compliance with exercise, home and travel O2 prescription;Verbalizes importance of monitoring SPO2 with pulse oximeter and return demonstration;Maintenance of O2 saturations>88%;Exhibits proper breathing techniques, such as pursed lip breathing or other method taught during program session;Compliance with respiratory medication;Demonstrates proper use of MDI's Exhibits compliance with exercise, home and travel O2 prescription;Verbalizes importance of monitoring SPO2 with pulse oximeter and return demonstration;Maintenance of O2 saturations>88%;Exhibits proper breathing techniques, such as pursed lip breathing or other method taught during program session;Compliance with respiratory medication;Demonstrates proper use of MDI's Exhibits compliance with exercise, home and travel O2 prescription;Verbalizes importance of monitoring SPO2 with pulse oximeter and return demonstration;Maintenance of O2 saturations>88%;Exhibits proper breathing techniques, such as pursed lip breathing or other method taught during program session;Compliance with respiratory medication;Demonstrates proper use of MDI's   Comments Reviewed PLB technique with pt.  Talked about how it works and it's importance in maintaining their exercise saturations. Nydia Bouton is doing well in rehab.  He has improved his compliance for wearing his CPAP.  He does not always use his oxygen at home when he is resting as his concentrator is upstairs.  When he is active, he will wear it.  He does use his pulse oximeter at home to keep and eye on his oxygen level.  He notices differences as too  if he is hot or cold as the hot air makes it harder to breathe.  He is not using the PLB as much at home, but trying to use it more and uses it most when trying to recover his breathing. [name] states he wears his CPAP but not all the time.  He can sit at the table without oxygen but uses it when he needs it if up and moving.  He has a pulse ox to check O2 at home. [name] has not been wearing his CPAP as it bothers his nose.  He is going to get refitted.  He has stopped using his oxygen at rest but keeps it on while moving. [name] hasnt had CPAP refitted yet.  He does monitor oxygen at home and uses it when needed.  He reports that it is 97% when at rest.   Goals/Expected Outcomes Short: Become more profiecient at using PLB.   Long: Become independent at using PLB. Short: Use PLB more  Long: Continued compliance. Short:  RT discussed importance of using CPAP Long:  compliance with all O2 prescriptions Short: Get refitted for CPAP  Long: Continue to use meds and improve breathing. Short: get refitted for CPAP Long:  continue to use O2 as directed          Oxygen Discharge (Final Oxygen Re-Evaluation):  Oxygen Re-Evaluation - 12/15/19 1003      Program Oxygen Prescription   Program Oxygen Prescription Continuous;E-Tanks      Home Oxygen   Home Oxygen Device Home Concentrator;Portable Concentrator    Sleep Oxygen Prescription CPAP    Liters per minute 2    Home Exercise Oxygen Prescription Continuous    Liters per minute 2    Home at Rest Exercise Oxygen Prescription Continuous    Liters per minute 2      Goals/Expected Outcomes   Short Term Goals To learn and exhibit compliance with exercise, home and travel O2 prescription;To learn and understand importance of monitoring SPO2 with pulse oximeter and demonstrate accurate use of the pulse oximeter.;To learn and understand importance of maintaining oxygen saturations>88%;To learn and demonstrate proper pursed lip breathing techniques or other breathing  techniques.;To learn and demonstrate proper use of respiratory medications    Long  Term Goals Exhibits compliance with exercise, home and travel O2 prescription;Verbalizes importance of monitoring SPO2 with pulse oximeter and return demonstration;Maintenance of O2 saturations>88%;Exhibits proper breathing techniques, such as pursed lip breathing or other method taught during program session;Compliance with respiratory medication;Demonstrates proper use of MDI's    Comments [name] hasnt had CPAP refitted yet.  He does monitor oxygen at home and uses it when needed.  He reports that it is 97% when at rest.    Goals/Expected Outcomes Short: get refitted for CPAP Long:  continue to use O2 as directed           Initial Exercise Prescription:  Initial Exercise Prescription - 09/08/19 1200      Date of Initial Exercise RX and Referring Provider   Date 09/08/19    Referring Provider [name]      Treadmill   MPH 1.5    Grade 0    Minutes 15      Recumbant Bike   Level 1    RPM 60    Minutes 15    METs 1.5      NuStep   Level 1    SPM 80    Minutes 15    METs 1.5      REL-XR   Level 1    Speed 50    Minutes 15    METs 1.5      Prescription Details   Frequency (times per week) 3    Duration Progress to 30 minutes of continuous aerobic without signs/symptoms of physical distress      Intensity   THRR 40-80% of Max Heartrate 113-136    Ratings of Perceived Exertion 11-15    Perceived Dyspnea 0-4      Resistance Training   Training Prescription Yes    Weight 3 lb    Reps 10-15           Perform Capillary Blood Glucose checks as needed.  Exercise Prescription Changes:  Exercise Prescription Changes    Row Name 09/08/19 1200 09/15/19 1400 09/30/19 1500 10/15/19 1400 10/29/19 0900     Response to Exercise   Blood Pressure (Admit) 114/74 140/80 120/80 130/82 114/68   Blood Pressure (Exercise) 136/84 160/82 142/60 122/82 142/80   Blood Pressure (Exit) 126/74 150/85  112/68 122/70 118/74   Heart Rate (Admit) 90 bpm 89 bpm 82 bpm 90 bpm 91 bpm   Heart Rate (Exercise) 114 bpm 112 bpm 97 bpm 109 bpm 103 bpm   Heart Rate (Exit) 108 bpm 91 bpm 97 bpm 87 bpm 96 bpm   Oxygen Saturation (Admit)  97 % 91 % 98 % 95 % 97 %   Oxygen Saturation (Exercise) 80 % 90 % 96 % 92 % 95 %   Oxygen Saturation (Exit) 89 % 96 % 99 % 97 % 98 %   Rating of Perceived Exertion (Exercise) '13 11 15 15 11   ' Perceived Dyspnea (Exercise) 1 -- '2 1 1   ' Symptoms none none none none none   Duration -- Continue with 30 min of aerobic exercise without signs/symptoms of physical distress. Continue with 30 min of aerobic exercise without signs/symptoms of physical distress. Continue with 30 min of aerobic exercise without signs/symptoms of physical distress. Continue with 30 min of aerobic exercise without signs/symptoms of physical distress.   Intensity -- THRR unchanged THRR unchanged THRR unchanged THRR unchanged     Progression   Progression -- Continue to progress workloads to maintain intensity without signs/symptoms of physical distress. Continue to progress workloads to maintain intensity without signs/symptoms of physical distress. Continue to progress workloads to maintain intensity without signs/symptoms of physical distress. Continue to progress workloads to maintain intensity without signs/symptoms of physical distress.   Average METs -- 3.6 2.7 2.87 2.25     Resistance Training   Training Prescription -- Yes Yes Yes Yes   Weight -- 3 lb 3 lb 5 lb 5 lb   Reps -- 10-15 10-15 10-15 10-15     Interval Training   Interval Training -- No No No No     Oxygen   Oxygen -- -- -- Continuous Continuous   Liters -- -- -- 3 3     Treadmill   MPH -- -- -- 1 1   Grade -- -- -- 0.5 0.5   Minutes -- -- -- 15 15   METs -- -- -- 1.83 1.83     Recumbant Bike   Level -- -- 1 5 --   RPM -- -- 60 -- --   Watts -- -- -- 30 --   Minutes -- -- 15 15 --   METs -- -- 2.1 2.1 --     NuStep    Level -- '2 4 4 ' --   SPM -- -- 80 -- --   Minutes -- '15 15 15 ' --   METs -- 3.1 3.3 2.7 --     REL-XR   Level -- 1 -- -- --   Minutes -- 15 -- -- --   METs -- 4.1 -- -- --     T5 Nustep   Level -- -- -- 1 1   SPM -- -- -- -- 80   Minutes -- -- -- 15 15   METs -- -- -- 1.9 1.9     Home Exercise Plan   Plans to continue exercise at -- -- -- Home (comment)  walking Home (comment)  walking   Frequency -- -- -- Add 2 additional days to program exercise sessions. Add 2 additional days to program exercise sessions.   Initial Home Exercises Provided -- -- -- 10/20/19 10/20/19   Row Name 11/10/19 1300 11/25/19 1300 12/23/19 1500 01/06/20 1500       Response to Exercise   Blood Pressure (Admit) 142/78 124/84 128/72 122/74    Blood Pressure (Exercise) 142/76 130/86 154/74 158/90    Blood Pressure (Exit) 122/80 102/60 110/70 114/60    Heart Rate (Admit) 98 bpm 79 bpm 84 bpm 74 bpm    Heart Rate (Exercise) 103 bpm 91 bpm 101 bpm 97 bpm    Heart Rate (  Exit) 101 bpm 89 bpm 87 bpm 77 bpm    Oxygen Saturation (Admit) 92 % 98 % 94 % 97 %    Oxygen Saturation (Exercise) 95 % 94 % 92 % 93 %    Oxygen Saturation (Exit) 98 % 98 % 97 % 98 %    Rating of Perceived Exertion (Exercise) '13 13 15 15    ' Perceived Dyspnea (Exercise) 0 0 3 2    Symptoms none none SOB SOB    Duration Continue with 30 min of aerobic exercise without signs/symptoms of physical distress. Continue with 30 min of aerobic exercise without signs/symptoms of physical distress. Continue with 30 min of aerobic exercise without signs/symptoms of physical distress. Continue with 30 min of aerobic exercise without signs/symptoms of physical distress.    Intensity THRR unchanged THRR unchanged THRR unchanged THRR unchanged      Progression   Progression Continue to progress workloads to maintain intensity without signs/symptoms of physical distress. Continue to progress workloads to maintain intensity without signs/symptoms of physical  distress. Continue to progress workloads to maintain intensity without signs/symptoms of physical distress. Continue to progress workloads to maintain intensity without signs/symptoms of physical distress.    Average METs 2.1 2.5 2 2.15      Resistance Training   Training Prescription Yes Yes Yes Yes    Weight 6 lb 6 lb 6 lb 6 lb    Reps 10-15 10-15 10-15 10-15      Interval Training   Interval Training No No -- --      Oxygen   Oxygen Continuous Continuous Continuous Continuous    Liters '3 3 3 3      ' Treadmill   MPH -- -- -- 1.5    Grade -- -- -- 0.5    Minutes -- -- -- 15    METs -- -- -- 2.25      Recumbant Bike   Level 5 1.5 -- 2    Watts 39 -- -- --    Minutes 15 15 -- 15    METs 3.1 2.5 -- --      NuStep   Level 4 4 -- 1    SPM -- 80 -- --    Minutes 15 15 -- 15    METs 3 2.4 -- 2.1      Arm Ergometer   Level 1 -- -- --    Minutes 15 -- -- --    METs 2.2 -- -- --      T5 Nustep   Level -- -- 1 1    SPM -- -- 80 --    Minutes -- -- 15 15    METs -- -- 2 2.1      Home Exercise Plan   Plans to continue exercise at Home (comment)  walking -- -- Home (comment)  walking    Frequency Add 2 additional days to program exercise sessions. -- -- Add 2 additional days to program exercise sessions.    Initial Home Exercises Provided 10/20/19 -- -- 10/20/19           Exercise Comments:  Exercise Comments    Row Name 09/05/19 1049 01/09/20 0937         Exercise Comments His knees may inhibit his exercise function. He is getting injections in his knees and the injections do help his pain. He still has pain "under my kneecap" He is hoping this exercise will help with the knee pain. We did talk about PT for  his knees. His physician was going to order PT. Michaeal graduated today from  rehab with 36 sessions completed.  Details of the patient's exercise prescription and what He needs to do in order to continue the prescription and progress were discussed with patient.   Patient was given a copy of prescription and goals.  Patient verbalized understanding.  Daqwan plans to continue to exercise by walking at home,possibly joining a gym when pandemic ends.             Exercise Goals and Review:  Exercise Goals    Row Name 09/08/19 1300             Exercise Goals   Increase Physical Activity Yes       Intervention Provide advice, education, support and counseling about physical activity/exercise needs.;Develop an individualized exercise prescription for aerobic and resistive training based on initial evaluation findings, risk stratification, comorbidities and participant's personal goals.       Expected Outcomes Short Term: Attend rehab on a regular basis to increase amount of physical activity.;Long Term: Add in home exercise to make exercise part of routine and to increase amount of physical activity.;Long Term: Exercising regularly at least 3-5 days a week.       Increase Strength and Stamina Yes       Intervention Provide advice, education, support and counseling about physical activity/exercise needs.;Develop an individualized exercise prescription for aerobic and resistive training based on initial evaluation findings, risk stratification, comorbidities and participant's personal goals.       Expected Outcomes Short Term: Increase workloads from initial exercise prescription for resistance, speed, and METs.;Short Term: Perform resistance training exercises routinely during rehab and add in resistance training at home;Long Term: Improve cardiorespiratory fitness, muscular endurance and strength as measured by increased METs and functional capacity (6MWT)       Able to understand and use rate of perceived exertion (RPE) scale Yes       Intervention Provide education and explanation on how to use RPE scale       Expected Outcomes Short Term: Able to use RPE daily in rehab to express subjective intensity level;Long Term:  Able to use RPE to guide intensity  level when exercising independently       Able to understand and use Dyspnea scale Yes       Intervention Provide education and explanation on how to use Dyspnea scale       Expected Outcomes Short Term: Able to use Dyspnea scale daily in rehab to express subjective sense of shortness of breath during exertion;Long Term: Able to use Dyspnea scale to guide intensity level when exercising independently       Knowledge and understanding of Target Heart Rate Range (THRR) Yes       Intervention Provide education and explanation of THRR including how the numbers were predicted and where they are located for reference       Expected Outcomes Short Term: Able to state/look up THRR;Short Term: Able to use daily as guideline for intensity in rehab;Long Term: Able to use THRR to govern intensity when exercising independently       Able to check pulse independently Yes       Intervention Provide education and demonstration on how to check pulse in carotid and radial arteries.;Review the importance of being able to check your own pulse for safety during independent exercise       Expected Outcomes Short Term: Able to explain why pulse checking is important during  independent exercise;Long Term: Able to check pulse independently and accurately       Understanding of Exercise Prescription Yes       Intervention Provide education, explanation, and written materials on patient's individual exercise prescription       Expected Outcomes Short Term: Able to explain program exercise prescription;Long Term: Able to explain home exercise prescription to exercise independently              Exercise Goals Re-Evaluation :  Exercise Goals Re-Evaluation    Row Name 09/10/19 1129 09/15/19 1352 09/30/19 1525 10/01/19 0950 10/15/19 1415     Exercise Goal Re-Evaluation   Exercise Goals Review Increase Physical Activity;Able to understand and use rate of perceived exertion (RPE) scale;Knowledge and understanding of Target  Heart Rate Range (THRR);Understanding of Exercise Prescription;Increase Strength and Stamina;Able to check pulse independently;Able to understand and use Dyspnea scale Increase Physical Activity;Increase Strength and Stamina;Understanding of Exercise Prescription Increase Physical Activity;Increase Strength and Stamina;Understanding of Exercise Prescription Increase Physical Activity;Increase Strength and Stamina;Understanding of Exercise Prescription Increase Physical Activity;Increase Strength and Stamina;Understanding of Exercise Prescription   Comments Reviewed RPE and dyspnea scales, THR and program prescription with pt today.  Pt voiced understanding and was given a copy of goals to take home. Nydia Bouton is off to a good start in rehab.   He has completed his first three full days of exercise and blood sugars have been good pre and post.  He is still hesistant on the treadmill, but we will continue to work with him through it.  We will continue to monitor his progress. [name] has increased workloads on NS and XR.  Staff will continue to work with him on walking. Nydia Bouton is off to a good start in rehab.  He works hard in class and has started to use his recumbent bike at home while watching TV.  He will do it for 80mn at home.  We talked about adding in more time at home.  Updated home exercise with pt today.  Pt plans to walk and use bike at home for exercise.  Reviewed THR, pulse, RPE, sign and symptoms, pulse oximetery and when to call 911 or MD.  Also discussed weather considerations and indoor options.  Pt voiced understanding. LNydia Boutonis doing well in rehab.  He is up to level 5 on the recumbent bike.  We will continue to monitor his progress.   Expected Outcomes Short: Use RPE daily to regulate intensity. Long: Follow program prescription in THR. Short: Talk about walking to and from rehab.  Long: Continue to follow program prescription and adhere to good attendance Short: work on walking Long:  increase overall  MET level and stamina Short: Start to increase exercise time at home to 30 min Long: Continue to improve stamina. Short: Increase T5 workload Long: Continue to improve stamina.   [place] 10/29/19 0957 11/10/19 1302 11/25/19 1331 11/26/19 0926 12/09/19 1519     Exercise Goal Re-Evaluation   Exercise Goals Review Increase Physical Activity;Increase Strength and Stamina;Understanding of Exercise Prescription Increase Physical Activity;Increase Strength and Stamina;Understanding of Exercise Prescription Increase Physical Activity;Increase Strength and Stamina;Understanding of Exercise Prescription Increase Physical Activity;Increase Strength and Stamina;Understanding of Exercise Prescription --   Comments LNydia Boutonhas tried the TM at 1.0 mph/.5 incline.  Staff will encourage him to continue to work on TM. LNydia Boutonhas been doing well in rehab.  He prefers not to be on the treadmill due to his knee.  He is up to level 5 on the  bike.  We will continue to monitor his progress. [name] attends consistently and his oxygen has stayed in the 90s for exercise.  He is up to 6 lbfor strength work. Nydia Bouton is doing well in rehab.  He is feeling stronger overall.  He is exercising at home on his off days in the garage with fans. Out since last review   Expected Outcomes Short:  build stamina on TM Long: improve overall MET level Short: Ensure he is maintainin at least 80 spm on steppers  Long: Continue to improve stamina. Short: continue to attend consistently Long:  improve overall MET level Short: Continue to exercise on his off days Long: Continue to exercise on off days. --   [facility] Name 12/15/19 0957 12/23/19 1541 01/06/20 1537         Exercise Goal Re-Evaluation   Exercise Goals Review Increase Physical Activity;Increase Strength and Stamina;Understanding of Exercise Prescription Increase Physical Activity;Increase Strength and Stamina;Understanding of Exercise Prescription Increase Physical Activity;Increase Strength and  Stamina;Understanding of Exercise Prescription     Comments [name] swimsat [place] on days not at [facility].  He feels very good working out in the pool. Lanis knee pain keeps him from progressing as much on machines.  Staff will encourag ehim to do what he can and work on Materials engineer. Nydia Bouton will be graduating this week!  He improved his post 6MWT by 141f!!  He is planning to continue to exericse by going [facility] [employer].     Expected Outcomes Short:  exercise consistently Long: continue to build stamina Short:  attend and exercise consistetly Long:  build overall stamina Graduate and continue to exercise independently            Discharge Exercise Prescription (Final Exercise Prescription Changes):  Exercise Prescription Changes - 01/06/20 1500      Response to Exercise   Blood Pressure (Admit) 122/74    Blood Pressure (Exercise) 158/90    Blood Pressure (Exit) 114/60    Heart Rate (Admit) 74 bpm    Heart Rate (Exercise) 97 bpm    Heart Rate (Exit) 77 bpm    Oxygen Saturation (Admit) 97 %    Oxygen Saturation (Exercise) 93 %    Oxygen Saturation (Exit) 98 %    Rating of Perceived Exertion (Exercise) 15    Perceived Dyspnea (Exercise) 2    Symptoms SOB    Duration Continue with 30 min of aerobic exercise without signs/symptoms of physical distress.    Intensity THRR unchanged      Progression   Progression Continue to progress workloads to maintain intensity without signs/symptoms of physical distress.    Average METs 2.15      Resistance Training   Training Prescription Yes    Weight 6 lb    Reps 10-15      Oxygen   Oxygen Continuous    Liters 3      Treadmill   MPH 1.5    Grade 0.5    Minutes 15    METs 2.25      Recumbant Bike   Level 2    Minutes 15      NuStep   Level 1    Minutes 15    METs 2.1      T5 Nustep   Level 1    Minutes 15    METs 2.1      Home Exercise Plan   Plans to continue exercise at Home (comment)   walking  Frequency Add 2 additional days to program exercise sessions.    Initial Home Exercises Provided 10/20/19           Nutrition:  Target Goals: Understanding of nutrition guidelines, daily intake of sodium <1563m, cholesterol <2089m calories 30% from fat and 7% or less from saturated fats, daily to have 5 or more servings of fruits and vegetables.  Education: Controlling Sodium/Reading Food Labels -Group verbal and written material supporting the discussion of sodium use in heart healthy nutrition. Review and explanation with models, verbal and written materials for utilization of the food label.   [facility]  Date 07/30/17  Educator CR  Instruction Review Code 1- VeUnited States Steel Corporationnderstanding      Education: General Nutrition Guidelines/Fats and Fiber: -Group instruction provided by verbal, written material, models and posters to present the general guidelines for heart healthy nutrition. Gives an explanation and review of dietary fats and fiber.   [facility]  Date 01/07/20  [facility] Review Code 1- Verbalizes Understanding      Biometrics:  Pre Biometrics - 09/08/19 1301      Pre Biometrics   Height 5' 9.5" (1.765 m)    Weight 221 lb 4.8 oz (100.4 kg)    BMI (Calculated) 32.22           Post Biometrics - 12/22/19 0939       Post  Biometrics   Height 5' 9.5" (1.765 m)    Weight 219 lb 11.2 oz (99.7 kg)    BMI (Calculated) 31.99           Nutrition Therapy Plan and Nutrition Goals:  Nutrition Therapy & Goals - 10/13/19 0953      Nutrition Therapy   Diet heart healthy, low Na, diabetic    Drug/Food Interactions Statins/Certain Fruits    Protein (specify units) 120g    Fiber 30 grams    Whole Grain Foods 3 servings    Saturated Fats 12 max. grams    Fruits and Vegetables 5 servings/day    Sodium 1.5 grams      Personal Nutrition Goals    Nutrition Goal ST: does not want to make short term goals at this time LT: wants to be able to walk without having assistance, now on 3L of O2 when exercising and 2L when not active; SOB affects activities of daily living  - needs to take breaks.    Comments 5.7 most recent A1C. B: Toast (potato bread - jelly and butter) and coffee - starbucks coffee (latte). S: bananas and other fruit D: spaghetti, chicken, sweet potatos, greens, and other vegetables; no salt, pt doesn't know what girlfriend cooks with S: chocolate pretzles, ice cream (gets one that has less sugar)  Drinks water all the time, during and in between meals. Lower appetite, sometimes doesn't eat at all. Discussed heart healthy eating and pulmonary MNT. Pt A1c 5.6 - eats many "naked" and simple CHOs, likely low A1C due to lower food consumption. Pt reports not eating much and sometimes does not eat at all. Pt also reports weight loss.      Intervention Plan   Intervention Prescribe, educate and counsel regarding individualized specific dietary modifications aiming towards targeted core components such as weight, hypertension, lipid management, diabetes, heart failure and other comorbidities.;Nutrition handout(s) given to patient.    Expected Outcomes Short Term Goal: Understand basic principles of dietary content, such as calories,  fat, sodium, cholesterol and nutrients.;Short Term Goal: A plan has been developed with personal nutrition goals set during dietitian appointment.;Long Term Goal: Adherence to prescribed nutrition plan.           Nutrition Assessments:  Nutrition Assessments - 12/31/19 1352      MEDFICTS Scores   Pre Score 40    Post Score 81    Score Difference 41           MEDIFICTS Score Key:          ?70 Need to make dietary changes          40-70 Heart Healthy Diet         ? 40 Therapeutic Level Cholesterol Diet  Nutrition Goals Re-Evaluation:  Nutrition Goals Re-Evaluation    [place] Name 10/27/19 [phone] 11/26/19  0932 12/15/19 0927         Goals   Nutrition Goal ST: [name] is reducing portion sizes to lose weight Long:  continue to exercise and eat smaller protions ST: [name] is reducing portion sizes to lose weight Long:  continue to exercise and eat smaller protions ST: high calorie, high protein MNT LT: gain muscle, stop muscle wasting.     Comment -- Nydia Bouton is doing well in rehab.  He is eating smaller portions and snacks throughout the day.  He is also getting in protein shakes.  His protein is now where he wants it to be. His iron levels were a little low so we talked about ways to boost that again. shows signs of moderate to sever muscle depletion, pt noticed losing significant muscle as well and is fatigued and has a hard time standing; pt shows no signs of fat loss. Discussed weight loss is not recommended in older adults and his COPD increasing his protein and calorie needs further. Pt reports having little food during the day and eating mostly vegetables and rice bowls with meat, chicken, or shrimp. Suspect pt is malnourished, unable to confirm without a full NFPE. Discussed high calorie and high protein MNT.     Expected Outcome -- Short: Build iron back up wiht vit C Long; Continue to eat better. ST: high calorie, high protein MNT LT: gain muscle, stop muscle wasting.            Nutrition Goals Discharge (Final Nutrition Goals Re-Evaluation):  Nutrition Goals Re-Evaluation - 12/15/19 8453      Goals   Nutrition Goal ST: high calorie, high protein MNT LT: gain muscle, stop muscle wasting.    Comment shows signs of moderate to sever muscle depletion, pt noticed losing significant muscle as well and is fatigued and has a hard time standing; pt shows no signs of fat loss. Discussed weight loss is not recommended in older adults and his COPD increasing his protein and calorie needs further. Pt reports having little food during the day and eating mostly vegetables and rice bowls with meat, chicken, or  shrimp. Suspect pt is malnourished, unable to confirm without a full NFPE. Discussed high calorie and high protein MNT.    Expected Outcome ST: high calorie, high protein MNT LT: gain muscle, stop muscle wasting.           Psychosocial: Target Goals: Acknowledge presence or absence of significant depression and/or stress, maximize coping skills, provide positive support system. Participant is able to verbalize types and ability to use techniques and skills needed for reducing stress and depression.   Education: Depression - Provides group verbal and written instruction on  the correlation between heart/lung disease and depressed mood, treatment options, and the stigmas associated with seeking treatment.   Education: Sleep Hygiene -Provides group verbal and written instruction about how sleep can affect your health.  Define sleep hygiene, discuss sleep cycles and impact of sleep habits. Review good sleep hygiene tips.    Pulmonary Rehab from 08/15/2017 in [facility] Cardiac and Pulmonary Rehab  Date 08/15/17  Educator [facility]  Instruction Review Code 1- Verbalizes Understanding      Education: Stress and Anxiety: - Provides group verbal and written instruction about the health risks of elevated stress and causes of high stress.  Discuss the correlation between heart/lung disease and anxiety and treatment options. Review healthy ways to manage with stress and anxiety.   Pulmonary Rehab from 08/15/2017 in [facility] Cardiac and Pulmonary Rehab  Date 07/04/17  Educator [facility]  Instruction Review Code 1- Verbalizes Understanding      Initial Review & Psychosocial Screening:  Initial Psych Review & Screening - 09/05/19 1028      Initial Review   Current issues with Current Stress Concerns    Source of Stress Concerns Unable to perform yard/household activities;Unable to participate in former interests or hobbies;Chronic Illness    Comments COPD is well controlled, was told his lung tests are worse.  his  knees prevent him from participating with daily chores      [place]? Yes   Girlfriend Tonette Bihari, DAra's sister in [place],  [place] in [place] and [place] Girlfriend Dara, Dara's sister in [place],  [place] in [place] and [place]      Barriers   Psychosocial barriers to participate in program There are no identifiable barriers or psychosocial needs.;The patient should benefit from training in stress management and relaxation.      Screening Interventions   Interventions Encouraged to exercise;To provide support and resources with identified psychosocial needs;Provide feedback about the scores to participant    Expected Outcomes Short Term goal: Utilizing psychosocial counselor, staff and physician to assist with identification of specific Stressors or current issues interfering with healing process. Setting desired goal for each stressor or current issue identified.;Long Term Goal: Stressors or current issues are controlled or eliminated.;Short Term goal: Identification and review with participant of any Quality of Life or Depression concerns found by scoring the questionnaire.;Long Term goal: The participant improves quality of Life and PHQ9 Scores as seen by post scores and/or verbalization of changes           Quality of Life Scores:  Scores of 19 and below usually indicate a poorer quality of life in these areas.  A difference of  2-3 points is a clinically meaningful difference.  A difference of 2-3 points in the total score of the Quality of Life Index has been associated with significant improvement in overall quality of life, self-image, physical symptoms, and general health in studies assessing change in quality of life.  PHQ-9: Recent Review Flowsheet Data    Depression screen [facility] 2/9 12/31/2019 11/26/2019 10/22/2019 10/01/2019 09/08/2019   Decreased Interest '2 2 1 2 3    ' Down, Depressed, Hopeless 1 1 0 1 2   PHQ - 2 Score '3 3 1 3 5   ' Altered sleeping 2 1 0 1 3     Tired, decreased energy '3 3 2 3 3    ' Change in appetite 0 0 0 0 0   Feeling bad or failure about yourself  2 0 0 0 0  Trouble concentrating '2 1 1 1 1   ' Moving slowly or fidgety/restless 0 0 0 0 1   Suicidal thoughts 0 0 0 0 0   PHQ-9 Score '12 8 4 8 13   ' Difficult doing work/chores Not difficult at all Somewhat difficult Somewhat difficult Not difficult at all Somewhat difficult     Interpretation of Total Score  Total Score Depression Severity:  1-4 = Minimal depression, 5-9 = Mild depression, 10-14 = Moderate depression, 15-19 = Moderately severe depression, 20-27 = Severe depression   Psychosocial Evaluation and Intervention:  Psychosocial Evaluation - 09/05/19 1041      Psychosocial Evaluation & Interventions   Interventions Encouraged to exercise with the program and follow exercise prescription    Comments [name] has no barriers to returning to the program after over a year. He stated that he has not been doing his usual exercise regimen due to the Lithium PHE. He is glad to have the referral so he can get back to a routine of exercise . He lives with his girlfrient Tonette Bihari (calls her Rose) and 2 dogs. He has a great support system with Rose. Other support is Rose's sister in [place], who is a Marine scientist and his children that live in [place] and [place]. His knees may inhibit his exercise function. He is getting injections in his knees and the injections do help his pain. He still has pain "under my kneecap" He is hoping this exercise will help with the knee pain. We did talk about PT for his knees. His physician was going to order PT. [name] should do well with the program    Expected Outcomes STG: [name] will benefit from consistent exercise to achieve his stated goals.  LTG [name] will after discharge be able to continue with his exercise to maintain his progress    Continue Psychosocial Services  Follow up required by staff           Psychosocial Re-Evaluation:  Psychosocial Re-Evaluation    [place]  Name 10/01/19 [phone] 10/22/19 1041 10/27/19 0938 11/26/19 0928 12/15/19 0956     Psychosocial Re-Evaluation   Current issues with Current Depression;Current Stress Concerns;Current Sleep Concerns Current Depression;Current Stress Concerns;Current Sleep Concerns Current Depression;Current Stress Concerns;Current Sleep Concerns Current Depression;Current Stress Concerns;Current Sleep Concerns Current Depression;Current Stress Concerns;Current Sleep Concerns   Comments Nydia Bouton is doing well in rehab. His PHQ score has improved from 13 to 8!  He is feeling better getting back into an exercise routine. He is also feleing like his attitude has gotten better.  He is now on a mission to lose weight.  He is using his CPAP again and helping him sleep better. Nydia Bouton is doing well in rehab. His PHQ score has improved from 13 to 8 and now 8 to 4!  He is using his CPAP again and helping him sleep better. [name] reports getting support from his sisters and that right now he isn't doing any therapy. [name] reports that he feels well and doesn't feel depressed. -- Nydia Bouton is doing well in rehab.  He enjoys coming to class as an outlet.  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  His PHQ has creeped back up after all this again.  He continues to struggle with sleep.  He is trying to stay positive.  His favorite things are his dogs as they bring him the most joy. --   Expected Outcomes Short: Continue with new attitude. Long: Continue to  stay positive. Short: Continue with new attitude. Long: Continue to stay positive. -- Short: Continue to exercise to get mental boost Long: Continue to stay positive --   Interventions Encouraged to attend Pulmonary Rehabilitation for the exercise;Stress management education Encouraged to attend Pulmonary Rehabilitation for the exercise;Stress management education -- Encouraged to attend Pulmonary Rehabilitation for the exercise;Stress management education  --   Continue Psychosocial Services  Follow up required by staff Follow up required by staff -- Follow up required by staff --   Comments -- COPD is well controlled, was told his lung tests are worse.  his knees prevent him from participating with daily chores -- -- --     Initial Review   Source of Stress Concerns -- Unable to perform yard/household activities;Unable to participate in former interests or hobbies;Chronic Illness -- -- --          Psychosocial Discharge (Final Psychosocial Re-Evaluation):  Psychosocial Re-Evaluation - 12/15/19 0956      Psychosocial Re-Evaluation   Current issues with Current Depression;Current Stress Concerns;Current Sleep Concerns           Education: Education Goals: Education classes will be provided on a weekly basis, covering required topics. Participant will state understanding/return demonstration of topics presented.  Learning Barriers/Preferences:  Learning Barriers/Preferences - 09/05/19 1034      Learning Barriers/Preferences   Learning Barriers None    Learning Preferences None           General Pulmonary Education Topics:  Infection Prevention: - Provides verbal and written material to individual with discussion of infection control including proper hand washing and proper equipment cleaning during exercise session.   Pulmonary Rehab from 01/07/2020 in [facility] Cardiac and Pulmonary Rehab  Date 09/08/19  Educator AS  Instruction Review Code 1- Verbalizes Understanding      Falls Prevention: - Provides verbal and written material to individual with discussion of falls prevention and safety.   Pulmonary Rehab from 01/07/2020 in [facility] Cardiac and Pulmonary Rehab  Date 09/08/19  Educator AS  Instruction Review Code 1- Verbalizes Understanding      Chronic Lung Diseases: - Group verbal and written instruction to review updates, respiratory medications, advancements in procedures and treatments. Discuss use of supplemental oxygen  including available portable oxygen systems, continuous and intermittent flow rates, concentrators, personal use and safety guidelines. Review proper use of inhaler and spacers. Provide informative websites for self-education.    Pulmonary Rehab from 08/15/2017 in [facility] Cardiac and Pulmonary Rehab  Date 07/25/17  Educator [facility]  Instruction Review Code 1- Verbalizes Understanding      Energy Conservation: - Provide group verbal and written instruction for methods to conserve energy, plan and organize activities. Instruct on pacing techniques, use of adaptive equipment and posture/positioning to relieve shortness of breath.   Pulmonary Rehab from 08/15/2017 in [facility] Cardiac and Pulmonary Rehab  Date 06/27/17  Educator [facility]  Instruction Review Code 1- Verbalizes Understanding      Triggers and Exacerbations: - Group verbal and written instruction to review types of environmental triggers and ways to prevent exacerbations. Discuss weather changes, air quality and the benefits of nasal washing. Review warning signs and symptoms to help prevent infections. Discuss techniques for effective airway clearance, coughing, and vibrations.   Pulmonary Rehab from 05/03/2016 in [facility] Cardiac and Pulmonary Rehab  Date 04/26/16  Educator LB  Instruction Review Code (retired) 2- meets goals/outcomes      AED/CPR: - Group verbal and written instruction with the use of models to demonstrate  the basic use of the AED with the basic ABC's of resuscitation.   Pulmonary Rehab from 08/15/2017 in [facility] Cardiac and Pulmonary Rehab  Date 06/29/17  Educator [facility]  Instruction Review Code 1- Actuary and Physiology of the Lungs: - Group verbal and written instruction with the use of models to provide basic lung anatomy and physiology related to function, structure and complications of lung disease.   Pulmonary Rehab from 08/15/2017 in [facility] Cardiac and Pulmonary Rehab  Date 07/11/17  Educator [facility]   Instruction Review Code 1- Verbalizes Understanding      Anatomy & Physiology of the Heart: - Group verbal and written instruction and models provide basic cardiac anatomy and physiology, with the coronary electrical and arterial systems. Review of Valvular disease and Heart Failure   Cardiac Medications: - Group verbal and written instruction to review commonly prescribed medications for heart disease. Reviews the medication, class of the drug, and side effects.   Pulmonary Rehab from 01/07/2020 in [facility] Cardiac and Pulmonary Rehab  Date 11/26/19  Educator SB  Instruction Review Code 1- Verbalizes Understanding      Other: -Provides group and verbal instruction on various topics (see comments)   Pulmonary Rehab from 08/15/2017 in [facility] Cardiac and Pulmonary Rehab  Date 05/23/17  Educator [facility]  Instruction Review Code 1- Verbalizes Understanding  [SLEEP]      Knowledge Questionnaire Score:  Knowledge Questionnaire Score - 12/31/19 1352      Knowledge Questionnaire Score   Pre Score 16/18    Post Score 16/18            Core Components/Risk Factors/Patient Goals at Admission:  Personal Goals and Risk Factors at Admission - 09/08/19 1314      Core Components/Risk Factors/Patient Goals on Admission    Weight Management Weight Loss;Yes    Intervention Weight Management: Develop a combined nutrition and exercise program designed to reach desired caloric intake, while maintaining appropriate intake of nutrient and fiber, sodium and fats, and appropriate energy expenditure required for the weight goal.;Weight Management: Provide education and appropriate resources to help participant work on and attain dietary goals.;Weight Management/Obesity: Establish reasonable short term and long term weight goals.;Obesity: Provide education and appropriate resources to help participant work on and attain dietary goals.    Admit Weight 221 lb 4.8 oz (100.4 kg)    Goal Weight: Short Term 215 lb  (97.5 kg)    Goal Weight: Long Term 200 lb (90.7 kg)    Expected Outcomes Short Term: Continue to assess and modify interventions until short term weight is achieved;Long Term: Adherence to nutrition and physical activity/exercise program aimed toward attainment of established weight goal;Weight Maintenance: Understanding of the daily nutrition guidelines, which includes 25-35% calories from fat, 7% or less cal from saturated fats, less than 244m cholesterol, less than 1.5gm of sodium, & 5 or more servings of fruits and vegetables daily;Weight Loss: Understanding of general recommendations for a balanced deficit meal plan, which promotes 1-2 lb weight loss per week and includes a negative energy balance of [phone] kcal/d    Diabetes Yes    Intervention Provide education about signs/symptoms and action to take for hypo/hyperglycemia.;Provide education about proper nutrition, including hydration, and aerobic/resistive exercise prescription along with prescribed medications to achieve blood glucose in normal ranges: Fasting glucose 65-99 mg/dL    Expected Outcomes Short Term: Participant verbalizes understanding of the signs/symptoms and immediate care of hyper/hypoglycemia, proper foot care and importance of medication, aerobic/resistive exercise  and nutrition plan for blood glucose control.;Long Term: Attainment of HbA1C < 7%.    Intervention Provide a combined exercise and nutrition program that is supplemented with education, support and counseling about heart failure. Directed toward relieving symptoms such as shortness of breath, decreased exercise tolerance, and extremity edema.    Expected Outcomes Improve functional capacity of life;Short term: Attendance in program 2-3 days a week with increased exercise capacity. Reported lower sodium intake. Reported increased fruit and vegetable intake. Reports medication compliance.;Short term: Daily weights obtained and reported for increase. Utilizing diuretic  protocols set by physician.;Long term: Adoption of self-care skills and reduction of barriers for early signs and symptoms recognition and intervention leading to self-care maintenance.    Intervention Provide education on lifestyle modifcations including regular physical activity/exercise, weight management, moderate sodium restriction and increased consumption of fresh fruit, vegetables, and low fat dairy, alcohol moderation, and smoking cessation.;Monitor prescription use compliance.    Expected Outcomes Short Term: Continued assessment and intervention until BP is < 140/39m HG in hypertensive participants. < 130/826mHG in hypertensive participants with diabetes, heart failure or chronic kidney disease.;Long Term: Maintenance of blood pressure at goal levels.           Education:Diabetes - Individual verbal and written instruction to review signs/symptoms of diabetes, desired ranges of glucose level fasting, after meals and with exercise. Acknowledge that pre and post exercise glucose checks will be done for 3 sessions at entry of program.   [facility]  Date 04/04/16  Educator LB  Instruction Review Code (retired) 2- meets goals/outcomes      Education: Know Your Numbers and Risk Factors: -Group verbal and written instruction about important numbers in your health.  Discussion of what are risk factors and how they play a role in the disease process.  Review of Cholesterol, Blood Pressure, Diabetes, and BMI and the role they play in your overall health.   [facility]  [facility] 08/10/17  [facility]- Verbalizes Understanding      Core Components/Risk Factors/Patient Goals Review:   Goals and Risk Factor Review    Row Name 10/01/19 [id number]/28/21 0934 11/26/19 0936 12/15/19 0953       Core Components/Risk Factors/Patient Goals Review   Personal Goals  Review Weight Management/Obesity;Improve shortness of breath with ADL's;Heart Failure;Hypertension Weight Management/Obesity;Improve shortness of breath with ADL's;Heart Failure;Hypertension Weight Management/Obesity;Improve shortness of breath with ADL's;Heart Failure;Hypertension Weight Management/Obesity;Improve shortness of breath with ADL's;Heart Failure;Hypertension    Review LaNydia Boutons doing well in rehab.  He is now on a new mission to lose weight and get under 200 lb.  His breathing is getting better and he does not need his oxygen at rest at home.  He denies heart failure symptoms currently and saw TiOtila Kluverecently and got a good report.  He is scheduled for an echo on June 16.  Blood pressures have been good and he does check them at home. [name] wants to lose 15 more lb.  He is exercising here and cutting down postion sizes.  [name] feels his SOB has improved since he has started to use better breathing techniques.  He talks to his Dr today about the echo. [name] continues to work on weight loss.  His goal is to get to 200 lb before he graduates. He is doing better with is breathing at rest, just when he is working hard.  He  is feeling better overall.  He is doing well with his blood pressures and he checks them at home regularly.  He is planning to meet with pain management after vacation next week.  He denies  any heart failure symptoms. [name] reoprts taking all meds as directed.  He does monitor BP at home.  He monitors BG - normally between 113-125 fasting.  He monitors O2 and uses supplemental when he is active.    Expected Outcomes Short: Continue to work on weight loss Long: Continue to improve breathing using PLB. Short: continue to exercise and eat better Long: continue to practice breathing techniques Short: Continue to work on weight loss Long: Continue to montior risk factors. Short: continue to monitor risk factors Long: maintain risk factors at optimal levels           Core Components/Risk  Factors/Patient Goals at Discharge (Final Review):   Goals and Risk Factor Review - 12/15/19 0953      Core Components/Risk Factors/Patient Goals Review   Personal Goals Review Weight Management/Obesity;Improve shortness of breath with ADL's;Heart Failure;Hypertension    Review [name] reoprts taking all meds as directed.  He does monitor BP at home.  He monitors BG - normally between 113-125 fasting.  He monitors O2 and uses supplemental when he is active.    Expected Outcomes Short: continue to monitor risk factors Long: maintain risk factors at optimal levels           ITP Comments:  ITP Comments    Row Name 09/05/19 1004 09/08/19 1312 09/10/19 1129 09/17/19 0533 10/15/19 0548   ITP Comments Virtual Orientation completed today. Exercise evaluation scheduled for Monday 5/10 with gym orientation. Documentation for diagnosis can be found in [facility] 4/21. Completed 6MWT and gym orientation.  Initial ITP created and sent for review to Dr. Emily Filbert, Medical Director. First full day of exercise!  Patient was oriented to gym and equipment including functions, settings, policies, and procedures.  Patient's individual exercise prescription and treatment plan were reviewed.  All starting workloads were established based on the results of the 6 minute walk test done at initial orientation visit.  The plan for exercise progression was also introduced and progression will be customized based on patient's performance and goals. 30 Day review completed. ITP review done, changes made as directed,and approval shown by signature of  Scientist, research (life sciences).  New to program 30 Day review completed. Medical Director ITP review done, changes made as directed, and signed approval by Medical Director.   [place] Name 11/12/19 0631 12/09/19 1519 12/10/19 0541 01/07/20 1156 01/09/20 0937   ITP Comments 30 Day review completed. Medical Director ITP review done, changes made as directed, and signed approval by Medical Director. Pt  out on vacation 30 Day review completed. Medical Director ITP review done, changes made as directed, and signed approval by Medical Director. 30 day review completed. ITP sent to Dr. Emily Filbert, Medical Director of Cardiac and Pulmonary Rehab. Continue with ITP unless changes are made by physician. Rodderick graduated today from  rehab with 36 sessions completed.  Details of the patient's exercise prescription and what He needs to do in order to continue the prescription and progress were discussed with patient.  Patient was given a copy of prescription and goals.  Patient verbalized understanding.  Edgerrin plans to continue to exercise by walking at home,possibly joining a gym when pandemic ends.          Comments: Discharge ITP

## 2020-01-09 NOTE — Progress Notes (Signed)
Discharge Progress Report  Patient Details  Name: Mario [name] MRN: [id number] Date of Birth: [birth year] Referring Provider:     Pulmonary Rehab from 09/08/2019 in [facility] Cardiac and Pulmonary Rehab  Referring Provider Meredeth Ide       Number of Visits: 56  Reason for Discharge:  Patient reached a stable level of exercise. Patient independent in their exercise.  Smoking History:  Social History   Tobacco Use  Smoking Status Former Smoker  . Packs/day: 0.50  . Years: 1.00  . Pack years: 0.50  . Types: Cigarettes  . Quit date: 05/01/1969  . Years since quitting: 50.7  Smokeless Tobacco Never Used  Tobacco Comment   Quit 1971    Diagnosis:  Chronic obstructive pulmonary disease, unspecified COPD type (HCC)  ADL UCSD:  Pulmonary Assessment Scores    Row Name 09/08/19 1302 12/22/19 1008 12/31/19 1351     ADL UCSD   ADL Phase -- Exit Exit   SOB Score total 28 -- 39   Rest 0 -- 0   Walk 1 -- 3   Stairs 3 -- 3   Bath 0 -- 1   Dress 0 -- 1   Shop 1 -- 1     CAT Score   CAT Score 7 -- 16     mMRC Score   mMRC Score 2 2 2           Initial Exercise Prescription:  Initial Exercise Prescription - 09/08/19 1200      Date of Initial Exercise RX and Referring Provider   Date 09/08/19    Referring Provider 11/08/19      Treadmill   MPH 1.5    Grade 0    Minutes 15      Recumbant Bike   Level 1    RPM 60    Minutes 15    METs 1.5      NuStep   Level 1    SPM 80    Minutes 15    METs 1.5      REL-XR   Level 1    Speed 50    Minutes 15    METs 1.5      Prescription Details   Frequency (times per week) 3    Duration Progress to 30 minutes of continuous aerobic without signs/symptoms of physical distress      Intensity   THRR 40-80% of Max Heartrate 113-136    Ratings of Perceived Exertion 11-15    Perceived Dyspnea 0-4      Resistance Training   Training Prescription Yes    Weight 3 lb    Reps 10-15           Discharge Exercise  Prescription (Final Exercise Prescription Changes):  Exercise Prescription Changes - 01/06/20 1500      Response to Exercise   Blood Pressure (Admit) 122/74    Blood Pressure (Exercise) 158/90    Blood Pressure (Exit) 114/60    Heart Rate (Admit) 74 bpm    Heart Rate (Exercise) 97 bpm    Heart Rate (Exit) 77 bpm    Oxygen Saturation (Admit) 97 %    Oxygen Saturation (Exercise) 93 %    Oxygen Saturation (Exit) 98 %    Rating of Perceived Exertion (Exercise) 15    Perceived Dyspnea (Exercise) 2    Symptoms SOB    Duration Continue with 30 min of aerobic exercise without signs/symptoms of physical distress.    Intensity THRR unchanged  Progression   Progression Continue to progress workloads to maintain intensity without signs/symptoms of physical distress.    Average METs 2.15      Resistance Training   Training Prescription Yes    Weight 6 lb    Reps 10-15      Oxygen   Oxygen Continuous    Liters 3      Treadmill   MPH 1.5    Grade 0.5    Minutes 15    METs 2.25      Recumbant Bike   Level 2    Minutes 15      NuStep   Level 1    Minutes 15    METs 2.1      T5 Nustep   Level 1    Minutes 15    METs 2.1      Home Exercise Plan   Plans to continue exercise at Home (comment)   walking   Frequency Add 2 additional days to program exercise sessions.    Initial Home Exercises Provided 10/20/19           Functional Capacity:  6 Minute Walk    Row Name 09/08/19 1242 12/22/19 0937       6 Minute Walk   Phase -- Discharge    Distance 595 feet 777 feet    Distance % Change -- 30.6 %    Distance Feet Change -- 182 ft    Walk Time 4.1 minutes 6 minutes    # of Rest Breaks 1 0    MPH 1.65 1.47    METS 1.43 1.77    RPE 13 15    Perceived Dyspnea  1 3    VO2 Peak 5.01 6.19    Symptoms No Yes (comment)    Comments -- knee pain 10/10    Resting HR 90 bpm 72 bpm    Resting BP 114/74 128/74    Resting Oxygen Saturation  97 % 94 %    Exercise Oxygen  Saturation  during 6 min walk 80 % 83 %    Max Ex. HR 114 bpm 101 bpm    Max Ex. BP 136/84 154/74    2 Minute Post BP 126/74 132/74      Interval HR   1 Minute HR 103 91    2 Minute HR 106 100    3 Minute HR 104 101    4 Minute HR 103 98    5 Minute HR 105 99    6 Minute HR 114 96    2 Minute Post HR 108 88    Interval Heart Rate? Yes Yes      Interval Oxygen   Interval Oxygen? Yes Yes    Baseline Oxygen Saturation % 97 % 94 %    1 Minute Oxygen Saturation % 84 % 88 %    1 Minute Liters of Oxygen 2 L 3 L    2 Minute Oxygen Saturation % 81 % 85 %    2 Minute Liters of Oxygen 2 L 3 L    3 Minute Oxygen Saturation % 80 % 83 %    3 Minute Liters of Oxygen 2 L 3 L    4 Minute Oxygen Saturation % 88 % 86 %    4 Minute Liters of Oxygen 2 L 3 L    5 Minute Oxygen Saturation % 87 % 86 %    5 Minute Liters of Oxygen 2 L 3 L  6 Minute Oxygen Saturation % 84 % 85 %    6 Minute Liters of Oxygen 2 L 3 L    2 Minute Post Oxygen Saturation % 89 % 97 %    2 Minute Post Liters of Oxygen 2 L 3 L            Nutrition & Weight - Outcomes:  Pre Biometrics - 09/08/19 1301      Pre Biometrics   Height 5' 9.5" (1.765 m)    Weight 221 lb 4.8 oz (100.4 kg)    BMI (Calculated) 32.22           Post Biometrics - 12/22/19 0939       Post  Biometrics   Height 5' 9.5" (1.765 m)    Weight 219 lb 11.2 oz (99.7 kg)    BMI (Calculated) 31.99           Nutrition:  Nutrition Therapy & Goals - 10/13/19 0953      Nutrition Therapy   Diet heart healthy, low Na, diabetic    Drug/Food Interactions Statins/Certain Fruits    Protein (specify units) 120g    Fiber 30 grams    Whole Grain Foods 3 servings    Saturated Fats 12 max. grams    Fruits and Vegetables 5 servings/day    Sodium 1.5 grams      Personal Nutrition Goals   Nutrition Goal ST: does not want to make short term goals at this time LT: wants to be able to walk without having assistance, now on 3L of O2 when exercising and 2L  when not active; SOB affects activities of daily living  - needs to take breaks.    Comments 5.7 most recent A1C. B: Toast (potato bread - jelly and butter) and coffee - [employer] coffee (latte). S: bananas and other fruit D: spaghetti, chicken, sweet potatos, greens, and other vegetables; no salt, pt doesn't know what girlfriend cooks with S: chocolate pretzles, ice cream (gets one that has less sugar)  Drinks water all the time, during and in between meals. Lower appetite, sometimes doesn't eat at all. Discussed heart healthy eating and pulmonary MNT. Pt A1c 5.6 - eats many "naked" and simple CHOs, likely low A1C due to lower food consumption. Pt reports not eating much and sometimes does not eat at all. Pt also reports weight loss.      Intervention Plan   Intervention Prescribe, educate and counsel regarding individualized specific dietary modifications aiming towards targeted core components such as weight, hypertension, lipid management, diabetes, heart failure and other comorbidities.;Nutrition handout(s) given to patient.    Expected Outcomes Short Term Goal: Understand basic principles of dietary content, such as calories, fat, sodium, cholesterol and nutrients.;Short Term Goal: A plan has been developed with personal nutrition goals set during dietitian appointment.;Long Term Goal: Adherence to prescribed nutrition plan.           Nutrition Discharge:  Nutrition Assessments - 12/31/19 1352      MEDFICTS Scores   Pre Score 40    Post Score 81    Score Difference 41           Goals reviewed with patient; copy given to patient.

## 2020-11-30 ENCOUNTER — Ambulatory Visit: Payer: Medicare Other | Attending: Nurse Practitioner

## 2020-11-30 ENCOUNTER — Other Ambulatory Visit: Payer: Self-pay

## 2020-11-30 DIAGNOSIS — R262 Difficulty in walking, not elsewhere classified: Secondary | ICD-10-CM | POA: Diagnosis present

## 2020-11-30 DIAGNOSIS — M6281 Muscle weakness (generalized): Secondary | ICD-10-CM | POA: Insufficient documentation

## 2020-11-30 DIAGNOSIS — R269 Unspecified abnormalities of gait and mobility: Secondary | ICD-10-CM | POA: Diagnosis present

## 2020-11-30 NOTE — Therapy (Signed)
[name] MAIN [facility] SERVICES [address] [place], [place], [address] Phone: [phone]   Fax:  [phone]  Physical Therapy Evaluation  Patient Details  Name: Mario [name] MRN: [id number] Date of Birth: 1946/03/02 Referring Provider (PT): [name], [name], NP  Encounter Date: 11/30/2020   PT End of Session - 11/30/20 1047     Visit Number 1    Number of Visits 17    Date for PT Re-Evaluation 01/25/21    Authorization - Visit Number 1    Authorization - Number of Visits 10    PT Start Time 0805    PT Stop Time 0857    PT Time Calculation (min) 52 min    Equipment Utilized During Treatment Gait belt    Activity Tolerance Patient tolerated treatment well;Treatment limited secondary to medical complications (Comment);Other (comment)   Elevated BP readings and O2 levels limiting eval testing.            Past Medical History:  Diagnosis Date   Arthritis    hands, knees   Atrial fibrillation (HCC)    CHF (congestive heart failure) (HCC)    COPD (chronic obstructive pulmonary disease) (HCC)    Diabetes mellitus without complication (HCC)    Diabetic neuropathy (HCC)    Heart murmur    in past, mild 1969   Hypertension    resolved after weight loss and more activity   Neuromuscular disorder (HCC)    neuropathy   Neuropathy    Pulmonary embolism (HCC)    approx 2014   Shingles '99   Sleep apnea    CPAP   Wears dentures    full upper and lower    Past Surgical History:  Procedure Laterality Date   CATARACT EXTRACTION W/PHACO Left 05/12/2019   Procedure: CATARACT EXTRACTION PHACO AND INTRAOCULAR LENS PLACEMENT (IOC) LEFTDIABETIC ISTENT INJ 2.43  00:28.6;  Surgeon: Nevada Crane, MD;  Location: [facility];  Service: Ophthalmology;  Laterality: Left;  Diabetic - oral meds sleep apnea   CATARACT EXTRACTION W/PHACO Right 06/02/2019   Procedure: CATARACT EXTRACTION PHACO AND INTRAOCULAR LENS PLACEMENT (IOC)  ISTENT INJ  RIGHT DIABETIC 1.31  00:25.4;  Surgeon: Nevada Crane, MD;  Location: [facility];  Service: Ophthalmology;  Laterality: Right;   COLONOSCOPY  08/30/2015   FINGER SURGERY Right    3'rd finger, right hand   KNEE SURGERY Right    screw in knee   WRIST SURGERY Left    metal removed    There were no vitals filed for this visit.    Subjective Assessment - 11/30/20 0810     Subjective Pt is a 75 y.o. male referred to OPPT for LE weakness, bilat join OA, and diabetic peripheral neuropathy. PMH includes: Paroxysmal A-fib, COPD, DM Type 2, CHF, OA of bilat knees, HTN, glaucoma of both eyes. Pt here for generalized weakness of LE's.    Pertinent History Pt is a 75 y.o. male referred to OPPT for LE weakness, bilat join OA, and diabetic peripheral neuropathy. PMH includes: Paroxysmal A-fib, COPD, DM Type 2, CHF, OA of bilat knees, HTN, glaucoma of both eyes. Pt reports losing weight 265 lbs to 210 lbs and this is when he noticed he started having generalized weakness and knee pain. Has received steroid injections which temporarily helped (~1 year ago). Pt having difficulty in bowling for fun, walking to complete community tasks and ADL's (requires electric buggy for shopping). Reports walking ~ 12 meters. Pt does report  NO falls besides slipping on lane with bowling. Has had near falls but has used walls to catch himself. Pt reports being on 2 L/min for COPD, denies any balance impairments. Pt's goals for PT are to return to bowling, and complete community walking tasks without having to take frequent seated rest breaks.    Limitations Lifting;Standing;Walking;House hold activities    How long can you sit comfortably? unlimited    How long can you stand comfortably? 3 minutes    How long can you walk comfortably? length of hallway in gym (10-12 m)    Patient Stated Goals Return to community ambulation.    Currently in Pain? No/denies    Pain Score 0-No pain                 [facility] PT Assessment - 11/30/20 0821       Assessment   Medical Diagnosis Generalized weakness    Referring Provider (PT) [name], Hermenia FiscalSarah Kathryn, NP    Prior Therapy Yes for LBP      Precautions   Precautions Fall      Restrictions   Weight Bearing Restrictions No      Balance Screen   Has the patient fallen in the past 6 months No    Has the patient had a decrease in activity level because of a fear of falling?  Yes    Is the patient reluctant to leave their home because of a fear of falling?  No      Home Environment   Living Environment Private residence    Living Arrangements Spouse/significant other    Type of Home House    Home Access Stairs to enter    Entrance Stairs-Number of Steps 3 STE, 13 to second floor    Entrance Stairs-Rails Right   To enter home, L for staircase   Home Layout Two level    Home Equipment None      Prior Function   Level of Independence Independent    Vocation Retired    [place]isure [place], Education administratorplay/walk dog      Cognition   Overall Cognitive Status Within Functional Limits for tasks assessed      Sensation   Light Touch Appears Intact             PAIN: 0/10 NPS sitting.  Vitals: 96% SPO2, 75 BPM, 159/96 mm Hg prior to exercise.  POSTURE: Rounded shoulders and slight kyphosis in standing.   AROM: AROM BUE: WFL  AROM BLE: WFL. Able to reach terminal knee extension.    STRENGTH:  Graded on a 0-5 scale Muscle Group Left Right  Shoulder flex /5 /5  Shoulder Abd /5 /5  Shoulder Ext /5 /5  Shoulder IR/ER /5 /5  Elbow /5 /5  Wrist/hand /5 /5  Hip Flex 4*/5 4/5  Hip Abd (seated) 4+/5 4+/5  Hip Add 5/5 5/5  Hip Ext NT NT  Hip IR/ER NT NT  Knee Flex 4+/5 4+/5  Knee Ext 5/5 5/5  Ankle DF 5/5 5/5  Ankle PF 5/5 5/5   SENSATION:  BLE : Normal sensation to LT bilaterally in lumbosacral dermatomes        Sensation           Intact      Diminished         Absent  Light touch LEs        FUNCTIONAL MOBILITY:  Pt  displays ability to safely STS with no UE support and no LOB.  BALANCE: Static Sitting Balance  Normal Able to maintain balance against maximal resistance   Good Able to maintain balance against moderate resistance X  Good-/Fair+ Accepts minimal resistance   Fair Able to sit unsupported without balance loss and without UE support   Poor+ Able to maintain with Minimal assistance from individual or chair   Poor Unable to maintain balance-requires mod/max support from individual or chair    Static Standing Balance  Normal Able to maintain standing balance against maximal resistance   Good Able to maintain standing balance against moderate resistance   Good-/Fair+ Able to maintain standing balance against minimal resistance   Fair Able to stand unsupported without UE support and without LOB for 1-2 min X  Fair- Requires Min A and UE support to maintain standing without loss of balance   Poor+ Requires mod A and UE support to maintain standing without loss of balance   Poor Requires max A and UE support to maintain standing balance without loss    Dynamic Sitting Balance  Normal Able to sit unsupported and weight shift across midline maximally   Good Able to sit unsupported and weight shift across midline moderately X  Good-/Fair+ Able to sit unsupported and weight shift across midline minimally   Fair Minimal weight shifting ipsilateral/front, difficulty crossing midline   Fair- Reach to ipsilateral side and unable to weight shift   Poor + Able to sit unsupported with min A and reach to ipsilateral side, unable to weight shift   Poor Able to sit unsupported with mod A and reach ipsilateral/front-can't cross midline    Standing Dynamic Balance  Normal Stand independently unsupported, able to weight shift and cross midline maximally   Good Stand independently unsupported, able to weight shift and cross midline moderately   Good-/Fair+ Stand independently unsupported, able to weight shift  across midline minimally X  Fair Stand independently unsupported, weight shift, and reach ipsilaterally, loss of balance when crossing midline   Poor+ Able to stand with Min A and reach ipsilaterally, unable to weight shift   Poor Able to stand with Mod A and minimally reach ipsilaterally, unable to cross midline.      GAIT: Step-to pattern with antalgic gait noted on LLE with limited hip extension bilaterally and decreased stance time on LLE due to repotrs of L knee pain.  OUTCOME MEASURES: TEST Outcome Interpretation  5 times sit<>stand 13.80 sec >60 yo, >15 sec indicates increased risk for falls  10 meter walk test           0.678      m/s <1.0 m/s indicates increased risk for falls; limited community ambulator  FOTO 48/64                 BP readings throughout objective testing  181/91 mm Hg with O2 at 80% on 2 L/min after 5xSTS and 10 m walk test. SPO2 re-tested after 3 min seated rest with SPO2 > 90%.  161/99 mm Hg after session after seated 5+ min performing FOTO.        Objective measurements completed on examination: See above findings.    PT Education - 11/30/20 1046     Education Details POC. Examination findings. Contacting PCP about elevated BP readings.    Person(s) Educated Patient    Methods Explanation    Comprehension Verbalized understanding              PT Short Term Goals - 11/30/20 1101       PT  SHORT TERM GOAL #1   Title Pt will be independent with HEP to improve strength, balance, endurance to improve community ADL's.    Baseline 8/2: Initiate next session    Time 4    Period Weeks    Status New    Target Date 12/28/20               PT Long Term Goals - 11/30/20 1103       PT LONG TERM GOAL #1   Title Pt will increase FOTO to target to demonstrate improved ability to complete functional mobility and ADL's.    Baseline 8/2: 48/64    Time 8    Period Weeks    Status New    Target Date 01/25/21      PT LONG TERM GOAL #2    Title Pt will improve 5xSTS time to < 10 sec to demonstrate clinically significant improvement in BLE strength.    Baseline 8/2: 13.8 sec    Time 8    Period Weeks    Status New    Target Date 01/25/21      PT LONG TERM GOAL #3   Title Pt will improve gait speed to at least 1.0 m/s to display evidenced norms to safely complete community wlaking tasks with decreased risk of falls.    Baseline 8/2: 0.678 m/s    Time 8    Period Weeks    Status New    Target Date 01/25/21      PT LONG TERM GOAL #4   Title Pt will improve to at least 1000' to demo ability to complete community distance walking tasks.    Baseline 8/2: Deferred due to BP readings.    Time 8    Period Weeks    Status New    Target Date 01/25/21                    Plan - 11/30/20 1048     Clinical Impression Statement Pt is a pleasant 75 y.o. male referred to PT for LE weakness, bilat knee OA, and diabetic peripheral neuropathy. Pt rests on 2L/min O2 via [place] at baseline. PMH includes: Paroxysmal A-fib, COPD, DM Type 2, CHF, OA of bilat knees, HTN, glaucoma of both eyes. Pt presents today with generalized LE weakness as evidenced by 5xSTS > 12 sec and decreased gait speed below 0.8 m/s indicating pt is at slight risk of falls with household and community ambulation tasks. Pt limited in objective findings today due to elevated BP readings warranting seated rest breaks and monitoring alon with SPO2 dropping to 80% while on 2 L after 10 m gait speed requiring seated rest of 3 min to reach > 90%. Pt would benefit from additional balance testing such as DGI or FGA, and due to limited endurance with short distance walking in clinic and concerns of difficulty with completing community walking tasks. Pt educated to contact PCP on elevated BP readings. Overall, objective measures found, pt presents with decreased LE strength and increased risk of falls based off of 10 m gait speed and 5xSTS scores. Pt would benefit from  skilled PT intervention to address endurance, weakness, and balance deficits in order to have pt return to ability to safely perform community distance ADL's with lessened SOB and reduced risk of falls.    Personal Factors and Comorbidities Time since onset of injury/illness/exacerbation;Age;Comorbidity 3+;Education;Past/Current Experience;Fitness    Comorbidities PMH includes: Paroxysmal A-fib, COPD, DM Type 2, CHF,  OA of bilat knees, HTN, glaucoma of both eyes.    Examination-Activity Limitations Stairs;Squat;Stand;Locomotion Level    Examination-Participation Restrictions Community Activity    Stability/Clinical Decision Making Evolving/Moderate complexity    Rehab Potential Fair    PT Frequency 2x / week    PT Duration 8 weeks    PT Treatment/Interventions ADLs/Self Care Home Management;Cryotherapy;Gait training;Therapeutic exercise;Patient/family education;Manual techniques;Passive range of motion;Spinal Manipulations;Dry needling;Aquatic Therapy;Electrical Stimulation;Stair training;Functional mobility training;Neuromuscular re-education;Therapeutic activities;Balance training;Energy conservation;Moist Heat    PT Next Visit Plan Assess BP, DGI/FGA, 2 minute or 6 minute walk test.    PT Home Exercise Plan Next session    Consulted and Agree with Plan of Care Patient             Patient will benefit from skilled therapeutic intervention in order to improve the following deficits and impairments:  Abnormal gait, Improper body mechanics, Decreased mobility, Decreased activity tolerance, Decreased balance, Decreased endurance, Decreased range of motion, Decreased strength, Difficulty walking, Decreased safety awareness, Impaired vision/preception  Visit Diagnosis: Abnormality of gait and mobility  Difficulty in walking, not elsewhere classified  Muscle weakness (generalized)     Problem List Patient Active Problem List   Diagnosis Date Noted   Bilateral primary osteoarthritis of  knee 06/11/2019   Cervical radicular pain 06/11/2019   Cervical facet joint syndrome 06/11/2019   Lumbar facet arthropathy 06/11/2019   Lumbar radiculopathy 06/11/2019   Chronic pain syndrome 06/11/2019   Lung nodule 02/09/2016   Exercise hypoxemia 12/28/2015   Chronic obstructive pulmonary disease (HCC) 03/16/2015   Nuclear sclerosis 03/16/2015   OSA (obstructive sleep apnea) 03/16/2015   Primary open angle glaucoma of both eyes, severe stage 03/16/2015   Type 2 diabetes mellitus without complication, without long-term current use of insulin (HCC) 03/16/2015   Arthralgia of left lower leg 10/29/2014   Osteoarthritis of left knee 09/10/2014   Type 2 diabetes mellitus with diabetic neuropathy (HCC) 08/04/2014   Cataract, nuclear sclerotic senile 04/01/2014   Combined forms of age-related cataract 04/01/2014   Keratitis sicca, bilateral (HCC) 04/01/2014   Primary open angle glaucoma 02/27/2014   Flat feet 01/29/2014   Foot pain, bilateral 01/29/2014   Tinea pedis of both feet 01/29/2014   Ptosis of both eyelids 11/25/2013   Type 2 diabetes mellitus without retinopathy (HCC) 11/25/2013   Onychomycosis 11/19/2013   Chronic systolic heart failure (HCC) 11/11/2013   Emphysema of lung (HCC) 11/11/2013   Essential hypertension 11/11/2013   Screen for colon cancer 11/05/2013   Cardiomyopathy (HCC) 10/08/2013   Systolic HF (heart failure) (HCC) 10/08/2013   Acute systolic CHF (congestive heart failure) (HCC) 09/16/2013   COPD (chronic obstructive pulmonary disease) (HCC) 09/16/2013   HTN (hypertension) 09/16/2013   Obesity 09/16/2013   PAF (paroxysmal atrial fibrillation) (HCC) 09/16/2013   Senile nuclear sclerosis 05/22/2013   Muscle weakness (generalized) 11/11/2009   Long term current use of anticoagulant therapy 06/17/2009   Pulmonary embolism (HCC) 06/17/2009   ED (erectile dysfunction) of organic origin 05/13/2003    Delphia Grates. Fairly IV, PT, DPT Physical Therapist- [facility]  11/30/2020, 11:21 AM  [facility] MAIN [facility] SERVICES [address] [place], [place], [address] Phone: [phone]   Fax:  [phone]  Name: Lovelace Cerveny MRN: [id number] Date of Birth: Sep 11, 1945

## 2020-12-02 ENCOUNTER — Other Ambulatory Visit: Payer: Self-pay

## 2020-12-02 ENCOUNTER — Ambulatory Visit: Payer: Medicare Other

## 2020-12-02 DIAGNOSIS — R269 Unspecified abnormalities of gait and mobility: Secondary | ICD-10-CM

## 2020-12-02 DIAGNOSIS — R262 Difficulty in walking, not elsewhere classified: Secondary | ICD-10-CM

## 2020-12-02 DIAGNOSIS — M6281 Muscle weakness (generalized): Secondary | ICD-10-CM

## 2020-12-02 NOTE — Therapy (Signed)
[facility] MAIN [facility] SERVICES [address] [place], [place], [address] Phone: [phone]   Fax:  [phone]  Physical Therapy Treatment  Patient Details  Name: Mario [name] MRN: [id number] Date of Birth: 16-Oct-1945 Referring Provider (PT): [name], [name], NP   Encounter Date: 12/02/2020   PT End of Session - 12/02/20 1248     Visit Number 2    Number of Visits 17    Date for PT Re-Evaluation 01/25/21    Authorization - Visit Number 2    Authorization - Number of Visits 10    PT Start Time 0800    PT Stop Time 0844    PT Time Calculation (min) 44 min    Equipment Utilized During Treatment Gait belt    Activity Tolerance Patient tolerated treatment well;Treatment limited secondary to medical complications (Comment);Other (comment)   Elevated BP readings and O2 levels limiting eval testing.            Past Medical History:  Diagnosis Date   Arthritis    hands, knees   Atrial fibrillation (HCC)    CHF (congestive heart failure) (HCC)    COPD (chronic obstructive pulmonary disease) (HCC)    Diabetes mellitus without complication (HCC)    Diabetic neuropathy (HCC)    Heart murmur    in past, mild 1969   Hypertension    resolved after weight loss and more activity   Neuromuscular disorder (HCC)    neuropathy   Neuropathy    Pulmonary embolism (HCC)    approx 2014   Shingles '99   Sleep apnea    CPAP   Wears dentures    full upper and lower    Past Surgical History:  Procedure Laterality Date   CATARACT EXTRACTION W/PHACO Left 05/12/2019   Procedure: CATARACT EXTRACTION PHACO AND INTRAOCULAR LENS PLACEMENT (IOC) LEFTDIABETIC ISTENT INJ 2.43  00:28.6;  Surgeon: Nevada Crane, MD;  Location: [facility];  Service: Ophthalmology;  Laterality: Left;  Diabetic - oral meds sleep apnea   CATARACT EXTRACTION W/PHACO Right 06/02/2019   Procedure: CATARACT EXTRACTION PHACO AND INTRAOCULAR LENS PLACEMENT (IOC)  ISTENT INJ  RIGHT DIABETIC 1.31  00:25.4;  Surgeon: Nevada Crane, MD;  Location: [facility];  Service: Ophthalmology;  Laterality: Right;   COLONOSCOPY  08/30/2015   FINGER SURGERY Right    3'rd finger, right hand   KNEE SURGERY Right    screw in knee   WRIST SURGERY Left    metal removed    There were no vitals filed for this visit.   Subjective Assessment - 12/02/20 1244     Subjective Patient presents with his oxygen. Patient reports no pain upon first arriving therapy. Left leg is painful with ambulation/standing but not painful in seated.    Pertinent History Pt is a 75 y.o. male referred to OPPT for LE weakness, bilat join OA, and diabetic peripheral neuropathy. PMH includes: Paroxysmal A-fib, COPD, DM Type 2, CHF, OA of bilat knees, HTN, glaucoma of both eyes. Pt reports losing weight 265 lbs to 210 lbs and this is when he noticed he started having generalized weakness and knee pain. Has received steroid injections which temporarily helped (~1 year ago). Pt having difficulty in bowling for fun, walking to complete community tasks and ADL's (requires electric buggy for shopping). Reports walking ~ 12 meters. Pt does report NO falls besides slipping on lane with bowling. Has had near falls but has used walls to catch himself. Pt  reports being on 2 L/min for COPD, denies any balance impairments. Pt's goals for PT are to return to bowling, and complete community walking tasks without having to take frequent seated rest breaks.    Limitations Lifting;Standing;Walking;House hold activities    How long can you sit comfortably? unlimited    How long can you stand comfortably? 3 minutes    How long can you walk comfortably? length of hallway in gym (10-12 m)    Patient Stated Goals Return to community ambulation.    Currently in Pain? No/denies             Patient reports no pain upon first arriving therapy. Left leg is painful with ambulation/standing but not painful in  seated.   BP at start of session 152/88  6 min walk test: 65 feet   Oxygen initially on 2L, brought up to 3L due to patient reporting physician allows increase for exercise    Perform HEP and demonstrate understanding:   Access Code: [email or web address] URL: [email or web address] Date: 12/02/2020 Prepared by: Precious Bard  Exercises Standing March with Counter Support - 1 x daily - 7 x weekly - 2 sets - 10 reps - 5 hold Standing Hip Extension with Counter Support - 1 x daily - 7 x weekly - 2 sets - 10 reps - 5 hold Standing Hip Abduction with Counter Support - 1 x daily - 7 x weekly - 2 sets - 10 reps - 5 hold Seated Long Arc Quad - 1 x daily - 7 x weekly - 2 sets - 10 reps - 5 hold Seated Heel Toe Raises - 1 x daily - 7 x weekly - 2 sets - 10 reps - 5 hold   Treatment:  -lateral step over theraband on floor 10x each LE : Sp02 drop to 87%   seated: -alternating tapping 2" step for 30 seconds x 2 trials.  -GTB hamstring curl 15x each LE  -GTB around bilateral ankles: 15x each LE IR/ER   Patient's vitals monitored throughout session with rest breaks provided to return Sp02 to therapeutic range.   Pt educated throughout session about proper posture and technique with exercises. Improved exercise technique, movement at target joints, use of target muscles after min to mod verbal, visual, tactile cues   Patient presents to physical therapy with excellent motivation. Education on Hep performed with patient demonstrating understanding. Monitoring of Sp02 required with every intervention due to frequent drop in Sp02 to <90 % requiring return to therapeutic range. Patient performed six minute walk test but was unable to finish the time due to pain, ambulation 64 ft without an AD. Patient will benefit from skilled physical therapy improve patient's ambulation capacity, mobility, strength, and independence with IADLs/ADLS.    PT Education - 12/02/20 1247     Education Details  exercise technique, HEP    Person(s) Educated Patient    Methods Explanation;Demonstration;Tactile cues;Verbal cues    Comprehension Verbalized understanding;Returned demonstration;Verbal cues required;Tactile cues required              PT Short Term Goals - 11/30/20 1101       PT SHORT TERM GOAL #1   Title Pt will be independent with HEP to improve strength, balance, endurance to improve community ADL's.    Baseline 8/2: Initiate next session    Time 4    Period Weeks    Status New    Target Date 12/28/20  PT Long Term Goals - 11/30/20 1103       PT LONG TERM GOAL #1   Title Pt will increase FOTO to target to demonstrate improved ability to complete functional mobility and ADL's.    Baseline 8/2: 48/64    Time 8    Period Weeks    Status New    Target Date 01/25/21      PT LONG TERM GOAL #2   Title Pt will improve 5xSTS time to < 10 sec to demonstrate clinically significant improvement in BLE strength.    Baseline 8/2: 13.8 sec    Time 8    Period Weeks    Status New    Target Date 01/25/21      PT LONG TERM GOAL #3   Title Pt will improve gait speed to at least 1.0 m/s to display evidenced norms to safely complete community wlaking tasks with decreased risk of falls.    Baseline 8/2: 0.678 m/s    Time 8    Period Weeks    Status New    Target Date 01/25/21      PT LONG TERM GOAL #4   Title Pt will improve to at least 1000' to demo ability to complete community distance walking tasks.    Baseline 8/2: Deferred due to BP readings.    Time 8    Period Weeks    Status New    Target Date 01/25/21                   Plan - 12/02/20 1251     Clinical Impression Statement Patient presents to physical therapy with excellent motivation. Education on Hep performed with patient demonstrating understanding. Monitoring of Sp02 required with every intervention due to frequent drop in Sp02 to <90 % requiring return to therapeutic range.  Patient performed six minute walk test but was unable to finish the time due to pain, ambulation 64 ft without an AD. Patient will benefit from skilled physical therapy improve patient's ambulation capacity, mobility, strength, and independence with IADLs/ADLS.    Personal Factors and Comorbidities Time since onset of injury/illness/exacerbation;Age;Comorbidity 3+;Education;Past/Current Experience;Fitness    Comorbidities PMH includes: Paroxysmal A-fib, COPD, DM Type 2, CHF, OA of bilat knees, HTN, glaucoma of both eyes.    Examination-Activity Limitations Stairs;Squat;Stand;Locomotion Level    Examination-Participation Restrictions Community Activity    Stability/Clinical Decision Making Evolving/Moderate complexity    Rehab Potential Fair    PT Frequency 2x / week    PT Duration 8 weeks    PT Treatment/Interventions ADLs/Self Care Home Management;Cryotherapy;Gait training;Therapeutic exercise;Patient/family education;Manual techniques;Passive range of motion;Spinal Manipulations;Dry needling;Aquatic Therapy;Electrical Stimulation;Stair training;Functional mobility training;Neuromuscular re-education;Therapeutic activities;Balance training;Energy conservation;Moist Heat    PT Next Visit Plan Assess BP, DGI/FGA, 2 minute or 6 minute walk test.    PT Home Exercise Plan Next session    Consulted and Agree with Plan of Care Patient             Patient will benefit from skilled therapeutic intervention in order to improve the following deficits and impairments:  Abnormal gait, Improper body mechanics, Decreased mobility, Decreased activity tolerance, Decreased balance, Decreased endurance, Decreased range of motion, Decreased strength, Difficulty walking, Decreased safety awareness, Impaired vision/preception  Visit Diagnosis: Abnormality of gait and mobility  Difficulty in walking, not elsewhere classified  Muscle weakness (generalized)     Problem List Patient Active Problem List    Diagnosis Date Noted   Bilateral primary osteoarthritis of knee 06/11/2019  Cervical radicular pain 06/11/2019   Cervical facet joint syndrome 06/11/2019   Lumbar facet arthropathy 06/11/2019   Lumbar radiculopathy 06/11/2019   Chronic pain syndrome 06/11/2019   Lung nodule 02/09/2016   Exercise hypoxemia 12/28/2015   Chronic obstructive pulmonary disease (HCC) 03/16/2015   Nuclear sclerosis 03/16/2015   OSA (obstructive sleep apnea) 03/16/2015   Primary open angle glaucoma of both eyes, severe stage 03/16/2015   Type 2 diabetes mellitus without complication, without long-term current use of insulin (HCC) 03/16/2015   Arthralgia of left lower leg 10/29/2014   Osteoarthritis of left knee 09/10/2014   Type 2 diabetes mellitus with diabetic neuropathy (HCC) 08/04/2014   Cataract, nuclear sclerotic senile 04/01/2014   Combined forms of age-related cataract 04/01/2014   Keratitis sicca, bilateral (HCC) 04/01/2014   Primary open angle glaucoma 02/27/2014   Flat feet 01/29/2014   Foot pain, bilateral 01/29/2014   Tinea pedis of both feet 01/29/2014   Ptosis of both eyelids 11/25/2013   Type 2 diabetes mellitus without retinopathy (HCC) 11/25/2013   Onychomycosis 11/19/2013   Chronic systolic heart failure (HCC) 11/11/2013   Emphysema of lung (HCC) 11/11/2013   Essential hypertension 11/11/2013   Screen for colon cancer 11/05/2013   Cardiomyopathy (HCC) 10/08/2013   Systolic HF (heart failure) (HCC) 10/08/2013   Acute systolic CHF (congestive heart failure) (HCC) 09/16/2013   COPD (chronic obstructive pulmonary disease) (HCC) 09/16/2013   HTN (hypertension) 09/16/2013   Obesity 09/16/2013   PAF (paroxysmal atrial fibrillation) (HCC) 09/16/2013   Senile nuclear sclerosis 05/22/2013   Muscle weakness (generalized) 11/11/2009   Long term current use of anticoagulant therapy 06/17/2009   Pulmonary embolism (HCC) 06/17/2009   ED (erectile dysfunction) of organic origin 05/13/2003     Precious BardMarina Tatumn Corbridge, PT, DPT  12/02/2020, 12:52 PM  [facility] [address] [place], [place], [address] Phone: [phone]   Fax:  [phone]  Name: Mario [name] MRN: [id number] Date of Birth: [birth year]

## 2020-12-07 ENCOUNTER — Other Ambulatory Visit: Payer: Self-pay

## 2020-12-07 ENCOUNTER — Ambulatory Visit: Payer: Medicare Other

## 2020-12-07 DIAGNOSIS — R269 Unspecified abnormalities of gait and mobility: Secondary | ICD-10-CM

## 2020-12-07 DIAGNOSIS — R262 Difficulty in walking, not elsewhere classified: Secondary | ICD-10-CM

## 2020-12-07 DIAGNOSIS — M6281 Muscle weakness (generalized): Secondary | ICD-10-CM

## 2020-12-07 NOTE — Therapy (Signed)
[facility] MAIN [facility] SERVICES [address] [place], [place], [address] Phone: [phone]   Fax:  [phone]  Physical Therapy Treatment  Patient Details  Name: Mario [name] MRN: [id number] Date of Birth: Jan 26, 1946 Referring Provider (PT): [name], [name], NP   Encounter Date: 12/07/2020   PT End of Session - 12/07/20 0811     Visit Number 3    Number of Visits 17    Date for PT Re-Evaluation 01/25/21    Authorization - Visit Number 3    Authorization - Number of Visits 10    PT Start Time 0800    PT Stop Time 0843    PT Time Calculation (min) 43 min    Equipment Utilized During Treatment Gait belt    Activity Tolerance Patient tolerated treatment well;Treatment limited secondary to medical complications (Comment);Other (comment)   Elevated BP readings and O2 levels limiting eval testing.            Past Medical History:  Diagnosis Date   Arthritis    hands, knees   Atrial fibrillation (HCC)    CHF (congestive heart failure) (HCC)    COPD (chronic obstructive pulmonary disease) (HCC)    Diabetes mellitus without complication (HCC)    Diabetic neuropathy (HCC)    Heart murmur    in past, mild 1969   Hypertension    resolved after weight loss and more activity   Neuromuscular disorder (HCC)    neuropathy   Neuropathy    Pulmonary embolism (HCC)    approx 2014   Shingles '99   Sleep apnea    CPAP   Wears dentures    full upper and lower    Past Surgical History:  Procedure Laterality Date   CATARACT EXTRACTION W/PHACO Left 05/12/2019   Procedure: CATARACT EXTRACTION PHACO AND INTRAOCULAR LENS PLACEMENT (IOC) LEFTDIABETIC ISTENT INJ 2.43  00:28.6;  Surgeon: Nevada Crane, MD;  Location: [facility];  Service: Ophthalmology;  Laterality: Left;  Diabetic - oral meds sleep apnea   CATARACT EXTRACTION W/PHACO Right 06/02/2019   Procedure: CATARACT EXTRACTION PHACO AND INTRAOCULAR LENS PLACEMENT (IOC)  ISTENT INJ  RIGHT DIABETIC 1.31  00:25.4;  Surgeon: Nevada Crane, MD;  Location: [facility];  Service: Ophthalmology;  Laterality: Right;   COLONOSCOPY  08/30/2015   FINGER SURGERY Right    3'rd finger, right hand   KNEE SURGERY Right    screw in knee   WRIST SURGERY Left    metal removed    There were no vitals filed for this visit.   Subjective Assessment - 12/07/20 0804     Subjective Patient reports he forgot to take his medicine this morning before his session. Is wearing braces and stockings on his legs.    Pertinent History Pt is a 75 y.o. male referred to OPPT for LE weakness, bilat join OA, and diabetic peripheral neuropathy. PMH includes: Paroxysmal A-fib, COPD, DM Type 2, CHF, OA of bilat knees, HTN, glaucoma of both eyes. Pt reports losing weight 265 lbs to 210 lbs and this is when he noticed he started having generalized weakness and knee pain. Has received steroid injections which temporarily helped (~1 year ago). Pt having difficulty in bowling for fun, walking to complete community tasks and ADL's (requires electric buggy for shopping). Reports walking ~ 12 meters. Pt does report NO falls besides slipping on lane with bowling. Has had near falls but has used walls to catch himself. Pt reports being on  2 L/min for COPD, denies any balance impairments. Pt's goals for PT are to return to bowling, and complete community walking tasks without having to take frequent seated rest breaks.    Limitations Lifting;Standing;Walking;House hold activities    How long can you sit comfortably? unlimited    How long can you stand comfortably? 3 minutes    How long can you walk comfortably? length of hallway in gym (10-12 m)    Patient Stated Goals Return to community ambulation.    Currently in Pain? No/denies                   BP at start of session: 168/91   Treatment: Standing in // bars: Lateral stepping 4x length of // bars; 3lb ankle weights : Sp02 drop to  86%; requires rest break afterwards.  Toy soldiers 4x length of // bars; 3lb ankle weights: BUE support  6" step: toe taps 12x each LE 6" step lateral step up/down 8x each side; patient stopped at 8x each side due to exhaustion.  Step over orange hurdle and back 10x each LE : BUE support   Seated: 3lb ankle weight:  LAQ 10x each LE hold 3 seconds. ; requires cueing for length of hold.  Alternating ER/IR 15x each LE  10x STS  Patient's vitals monitored throughout session with rest breaks provided to return Sp02 to therapeutic range.  3L of 02 via nasal cannula during exercises required.    Pt educated throughout session about proper posture and technique with exercises. Improved exercise technique, movement at target joints, use of target muscles after min to mod verbal, visual, tactile cues      Patient is highly motivated throughout physical therapy session. He needs frequent rest breaks to return Sp02 to therapeutic range. Patient has intermittent L knee pain limiting prolonged standing in addition to his SOB. Education on breathing in through nose for increase oxygen performed with patient demonstrating understanding. Patient will benefit from skilled physical therapy improve patient's ambulation capacity, mobility, strength, and independence with IADLs/ADLS.          PT Education - 12/07/20 0810     Education Details exercise technique, body mechanics, SP02 monitoring.    Person(s) Educated Patient    Methods Explanation;Demonstration;Tactile cues;Verbal cues    Comprehension Verbalized understanding;Returned demonstration;Verbal cues required;Tactile cues required              PT Short Term Goals - 11/30/20 1101       PT SHORT TERM GOAL #1   Title Pt will be independent with HEP to improve strength, balance, endurance to improve community ADL's.    Baseline 8/2: Initiate next session    Time 4    Period Weeks    Status New    Target Date 12/28/20                PT Long Term Goals - 11/30/20 1103       PT LONG TERM GOAL #1   Title Pt will increase FOTO to target to demonstrate improved ability to complete functional mobility and ADL's.    Baseline 8/2: 48/64    Time 8    Period Weeks    Status New    Target Date 01/25/21      PT LONG TERM GOAL #2   Title Pt will improve 5xSTS time to < 10 sec to demonstrate clinically significant improvement in BLE strength.    Baseline 8/2: 13.8 sec    Time 8  Period Weeks    Status New    Target Date 01/25/21      PT LONG TERM GOAL #3   Title Pt will improve gait speed to at least 1.0 m/s to display evidenced norms to safely complete community wlaking tasks with decreased risk of falls.    Baseline 8/2: 0.678 m/s    Time 8    Period Weeks    Status New    Target Date 01/25/21      PT LONG TERM GOAL #4   Title Pt will improve 6MWT to at least 1000' to demo ability to complete community distance walking tasks.    Baseline 8/2: Deferred due to BP readings.    Time 8    Period Weeks    Status New    Target Date 01/25/21                   Plan - 12/07/20 0818     Clinical Impression Statement Patient is highly motivated throughout physical therapy session. He needs frequent rest breaks to return Sp02 to therapeutic range. Patient has intermittent L knee pain limiting prolonged standing in addition to his SOB. Education on breathing in through nose for increase oxygen performed with patient demonstrating understanding. Patient will benefit from skilled physical therapy improve patient's ambulation capacity, mobility, strength, and independence with IADLs/ADLS.    Personal Factors and Comorbidities Time since onset of injury/illness/exacerbation;Age;Comorbidity 3+;Education;Past/Current Experience;Fitness    Comorbidities PMH includes: Paroxysmal A-fib, COPD, DM Type 2, CHF, OA of bilat knees, HTN, glaucoma of both eyes.    Examination-Activity Limitations  Stairs;Squat;Stand;Locomotion Level    Examination-Participation Restrictions Community Activity    Stability/Clinical Decision Making Evolving/Moderate complexity    Rehab Potential Fair    PT Frequency 2x / week    PT Duration 8 weeks    PT Treatment/Interventions ADLs/Self Care Home Management;Cryotherapy;Gait training;Therapeutic exercise;Patient/family education;Manual techniques;Passive range of motion;Spinal Manipulations;Dry needling;Aquatic Therapy;Electrical Stimulation;Stair training;Functional mobility training;Neuromuscular re-education;Therapeutic activities;Balance training;Energy conservation;Moist Heat    PT Next Visit Plan Assess BP, DGI/FGA, 2 minute or 6 minute walk test.    PT Home Exercise Plan Next session    Consulted and Agree with Plan of Care Patient             Patient will benefit from skilled therapeutic intervention in order to improve the following deficits and impairments:  Abnormal gait, Improper body mechanics, Decreased mobility, Decreased activity tolerance, Decreased balance, Decreased endurance, Decreased range of motion, Decreased strength, Difficulty walking, Decreased safety awareness, Impaired vision/preception  Visit Diagnosis: Abnormality of gait and mobility  Difficulty in walking, not elsewhere classified  Muscle weakness (generalized)     Problem List Patient Active Problem List   Diagnosis Date Noted   Bilateral primary osteoarthritis of knee 06/11/2019   Cervical radicular pain 06/11/2019   Cervical facet joint syndrome 06/11/2019   Lumbar facet arthropathy 06/11/2019   Lumbar radiculopathy 06/11/2019   Chronic pain syndrome 06/11/2019   Lung nodule 02/09/2016   Exercise hypoxemia 12/28/2015   Chronic obstructive pulmonary disease (HCC) 03/16/2015   Nuclear sclerosis 03/16/2015   OSA (obstructive sleep apnea) 03/16/2015   Primary open angle glaucoma of both eyes, severe stage 03/16/2015   Type 2 diabetes mellitus without  complication, without long-term current use of insulin (HCC) 03/16/2015   Arthralgia of left lower leg 10/29/2014   Osteoarthritis of left knee 09/10/2014   Type 2 diabetes mellitus with diabetic neuropathy (HCC) 08/04/2014   Cataract, nuclear sclerotic senile 04/01/2014  Combined forms of age-related cataract 04/01/2014   Keratitis sicca, bilateral (HCC) 04/01/2014   Primary open angle glaucoma 02/27/2014   Flat feet 01/29/2014   Foot pain, bilateral 01/29/2014   Tinea pedis of both feet 01/29/2014   Ptosis of both eyelids 11/25/2013   Type 2 diabetes mellitus without retinopathy (HCC) 11/25/2013   Onychomycosis 11/19/2013   Chronic systolic heart failure (HCC) 11/11/2013   Emphysema of lung (HCC) 11/11/2013   Essential hypertension 11/11/2013   Screen for colon cancer 11/05/2013   Cardiomyopathy (HCC) 10/08/2013   Systolic HF (heart failure) (HCC) 10/08/2013   Acute systolic CHF (congestive heart failure) (HCC) 09/16/2013   COPD (chronic obstructive pulmonary disease) (HCC) 09/16/2013   HTN (hypertension) 09/16/2013   Obesity 09/16/2013   PAF (paroxysmal atrial fibrillation) (HCC) 09/16/2013   Senile nuclear sclerosis 05/22/2013   Muscle weakness (generalized) 11/11/2009   Long term current use of anticoagulant therapy 06/17/2009   Pulmonary embolism (HCC) 06/17/2009   ED (erectile dysfunction) of organic origin 05/13/2003    Precious Bard, PT, DPT  12/07/2020, 8:44 AM  [facility] MAIN [facility] SERVICES [address] [place], [place], [address] Phone: [phone]   Fax:  [phone]  Name: Mario [name] MRN: [id number] Date of Birth: Aug 29, 1945

## 2020-12-09 ENCOUNTER — Ambulatory Visit: Payer: Medicare Other

## 2020-12-14 ENCOUNTER — Ambulatory Visit: Payer: Medicare Other

## 2020-12-14 ENCOUNTER — Other Ambulatory Visit: Payer: Self-pay

## 2020-12-14 DIAGNOSIS — R269 Unspecified abnormalities of gait and mobility: Secondary | ICD-10-CM

## 2020-12-14 DIAGNOSIS — R262 Difficulty in walking, not elsewhere classified: Secondary | ICD-10-CM

## 2020-12-14 DIAGNOSIS — M6281 Muscle weakness (generalized): Secondary | ICD-10-CM

## 2020-12-14 NOTE — Therapy (Signed)
[facility] MAIN [facility] SERVICES [address] [place], [place], [address] Phone: [phone]   Fax:  [phone]  Physical Therapy Treatment  Patient Details  Name: Mario [name] MRN: [id number] Date of Birth: [birth year] Referring Provider (PT): [name], [name], NP   Encounter Date: 12/14/2020   PT End of Session - 12/14/20 0757     Visit Number 4    Number of Visits 17    Date for PT Re-Evaluation 01/25/21    Authorization - Visit Number 4    Authorization - Number of Visits 10    PT Start Time 0759    PT Stop Time 0844    PT Time Calculation (min) 45 min    Equipment Utilized During Treatment Gait belt    Activity Tolerance Patient tolerated treatment well;Treatment limited secondary to medical complications (Comment);Other (comment)   Elevated BP readings and O2 levels limiting eval testing.            Past Medical History:  Diagnosis Date   Arthritis    hands, knees   Atrial fibrillation (HCC)    CHF (congestive heart failure) (HCC)    COPD (chronic obstructive pulmonary disease) (HCC)    Diabetes mellitus without complication (HCC)    Diabetic neuropathy (HCC)    Heart murmur    in past, mild 1969   Hypertension    resolved after weight loss and more activity   Neuromuscular disorder (HCC)    neuropathy   Neuropathy    Pulmonary embolism (HCC)    approx 2014   Shingles '99   Sleep apnea    CPAP   Wears dentures    full upper and lower    Past Surgical History:  Procedure Laterality Date   CATARACT EXTRACTION W/PHACO Left 05/12/2019   Procedure: CATARACT EXTRACTION PHACO AND INTRAOCULAR LENS PLACEMENT (IOC) LEFTDIABETIC ISTENT INJ 2.43  00:28.6;  Surgeon: Nevada Crane, MD;  Location: [facility];  Service: Ophthalmology;  Laterality: Left;  Diabetic - oral meds sleep apnea   CATARACT EXTRACTION W/PHACO Right 06/02/2019   Procedure: CATARACT EXTRACTION PHACO AND INTRAOCULAR LENS PLACEMENT (IOC)  ISTENT INJ  RIGHT DIABETIC 1.31  00:25.4;  Surgeon: Nevada Crane, MD;  Location: [facility];  Service: Ophthalmology;  Laterality: Right;   COLONOSCOPY  08/30/2015   FINGER SURGERY Right    3'rd finger, right hand   KNEE SURGERY Right    screw in knee   WRIST SURGERY Left    metal removed    There were no vitals filed for this visit.   Subjective Assessment - 12/14/20 0803     Subjective Patient missed last session due to illness. Is feeling better today.    Pertinent History Pt is a 75 y.o. male referred to OPPT for LE weakness, bilat join OA, and diabetic peripheral neuropathy. PMH includes: Paroxysmal A-fib, COPD, DM Type 2, CHF, OA of bilat knees, HTN, glaucoma of both eyes. Pt reports losing weight 265 lbs to 210 lbs and this is when he noticed he started having generalized weakness and knee pain. Has received steroid injections which temporarily helped (~1 year ago). Pt having difficulty in bowling for fun, walking to complete community tasks and ADL's (requires electric buggy for shopping). Reports walking ~ 12 meters. Pt does report NO falls besides slipping on lane with bowling. Has had near falls but has used walls to catch himself. Pt reports being on 2 L/min for COPD, denies any balance impairments. Pt's goals  for PT are to return to bowling, and complete community walking tasks without having to take frequent seated rest breaks.    Limitations Lifting;Standing;Walking;House hold activities    How long can you sit comfortably? unlimited    How long can you stand comfortably? 3 minutes    How long can you walk comfortably? length of hallway in gym (10-12 m)    Patient Stated Goals Return to community ambulation.    Currently in Pain? No/denies                Patient missed last session due to illness. Is feeling better today.     BP at start of session: 165/96    Treatment: Standing in // bars: High knee marching 3lb ankle weights 6x length of / bars   Lateral stepping 4x length of // bars; 3lb ankle weights :  Standing hip extension 15x each LE; BUE support with 3lb ankle weight Standing with heel raise 15x with BUE support and 3lb ankle weight Step over orange hurdle and back 10x each LE : BUE support patient reports fatigued; cue for reducing circumduction of LE's  Lateral step over orange hurdle and back 10x each side, UE support;   airex pad: static stand reach for ball and throw into basket for pertubation x 4 minutes  Seated: GTB around bilateral knees: abduction 20x with focus on slow controlled movements  10x STS   Patient's vitals monitored throughout session with rest breaks provided to return Sp02 to therapeutic range.  3L of 02 via nasal cannula during exercises required.    Pt educated throughout session about proper posture and technique with exercises. Improved exercise technique, movement at target joints, use of target muscles after min to mod verbal, visual, tactile cues   Patient reports feeling highly motivated throughout physical therapy session. He requires intermittent rest breaks for return to therapeutic range and reduced fatigue. Intermittent L knee pain noted throughout session with patient turning too quickly and prolonged standing. Patient reports his right LE has increased challenge with coordination. Patient will benefit from skilled physical therapy improve patient's ambulation capacity, mobility, strength, and independence with IADLs/ADLS.                PT Education - 12/14/20 0757     Education Details exercise technique, conditioning, Sp02 monitoring    Person(s) Educated Patient    Methods Explanation;Demonstration;Tactile cues;Verbal cues    Comprehension Verbalized understanding;Returned demonstration;Verbal cues required;Tactile cues required;Need further instruction              PT Short Term Goals - 11/30/20 1101       PT SHORT TERM GOAL #1   Title Pt will be independent  with HEP to improve strength, balance, endurance to improve community ADL's.    Baseline 8/2: Initiate next session    Time 4    Period Weeks    Status New    Target Date 12/28/20               PT Long Term Goals - 11/30/20 1103       PT LONG TERM GOAL #1   Title Pt will increase FOTO to target to demonstrate improved ability to complete functional mobility and ADL's.    Baseline 8/2: 48/64    Time 8    Period Weeks    Status New    Target Date 01/25/21      PT LONG TERM GOAL #2   Title Pt will improve 5xSTS  time to < 10 sec to demonstrate clinically significant improvement in BLE strength.    Baseline 8/2: 13.8 sec    Time 8    Period Weeks    Status New    Target Date 01/25/21      PT LONG TERM GOAL #3   Title Pt will improve gait speed to at least 1.0 m/s to display evidenced norms to safely complete community wlaking tasks with decreased risk of falls.    Baseline 8/2: 0.678 m/s    Time 8    Period Weeks    Status New    Target Date 01/25/21      PT LONG TERM GOAL #4   Title Pt will improve to at least 1000' to demo ability to complete community distance walking tasks.    Baseline 8/2: Deferred due to BP readings.    Time 8    Period Weeks    Status New    Target Date 01/25/21                   Plan - 12/14/20 0827     Clinical Impression Statement Patient reports feeling highly motivated throughout physical therapy session. He requires intermittent rest breaks for return to therapeutic range and reduced fatigue. Intermittent L knee pain noted throughout session with patient turning too quickly and prolonged standing. Patient reports his right LE has increased challenge with coordination. Patient will benefit from skilled physical therapy improve patient's ambulation capacity, mobility, strength, and independence with IADLs/ADLS.    Personal Factors and Comorbidities Time since onset of injury/illness/exacerbation;Age;Comorbidity  3+;Education;Past/Current Experience;Fitness    Comorbidities PMH includes: Paroxysmal A-fib, COPD, DM Type 2, CHF, OA of bilat knees, HTN, glaucoma of both eyes.    Examination-Activity Limitations Stairs;Squat;Stand;Locomotion Level    Examination-Participation Restrictions Community Activity    Stability/Clinical Decision Making Evolving/Moderate complexity    Rehab Potential Fair    PT Frequency 2x / week    PT Duration 8 weeks    PT Treatment/Interventions ADLs/Self Care Home Management;Cryotherapy;Gait training;Therapeutic exercise;Patient/family education;Manual techniques;Passive range of motion;Spinal Manipulations;Dry needling;Aquatic Therapy;Electrical Stimulation;Stair training;Functional mobility training;Neuromuscular re-education;Therapeutic activities;Balance training;Energy conservation;Moist Heat    PT Next Visit Plan BP, strengthening    PT Home Exercise Plan Next session    Consulted and Agree with Plan of Care Patient             Patient will benefit from skilled therapeutic intervention in order to improve the following deficits and impairments:  Abnormal gait, Improper body mechanics, Decreased mobility, Decreased activity tolerance, Decreased balance, Decreased endurance, Decreased range of motion, Decreased strength, Difficulty walking, Decreased safety awareness, Impaired vision/preception  Visit Diagnosis: Abnormality of gait and mobility  Difficulty in walking, not elsewhere classified  Muscle weakness (generalized)     Problem List Patient Active Problem List   Diagnosis Date Noted   Bilateral primary osteoarthritis of knee 06/11/2019   Cervical radicular pain 06/11/2019   Cervical facet joint syndrome 06/11/2019   Lumbar facet arthropathy 06/11/2019   Lumbar radiculopathy 06/11/2019   Chronic pain syndrome 06/11/2019   Lung nodule 02/09/2016   Exercise hypoxemia 12/28/2015   Chronic obstructive pulmonary disease (HCC) 03/16/2015   Nuclear  sclerosis 03/16/2015   OSA (obstructive sleep apnea) 03/16/2015   Primary open angle glaucoma of both eyes, severe stage 03/16/2015   Type 2 diabetes mellitus without complication, without long-term current use of insulin (HCC) 03/16/2015   Arthralgia of left lower leg 10/29/2014   Osteoarthritis of left knee 09/10/2014  Type 2 diabetes mellitus with diabetic neuropathy (HCC) 08/04/2014   Cataract, nuclear sclerotic senile 04/01/2014   Combined forms of age-related cataract 04/01/2014   Keratitis sicca, bilateral (HCC) 04/01/2014   Primary open angle glaucoma 02/27/2014   Flat feet 01/29/2014   Foot pain, bilateral 01/29/2014   Tinea pedis of both feet 01/29/2014   Ptosis of both eyelids 11/25/2013   Type 2 diabetes mellitus without retinopathy (HCC) 11/25/2013   Onychomycosis 11/19/2013   Chronic systolic heart failure (HCC) 11/11/2013   Emphysema of lung (HCC) 11/11/2013   Essential hypertension 11/11/2013   Screen for colon cancer 11/05/2013   Cardiomyopathy (HCC) 10/08/2013   Systolic HF (heart failure) (HCC) 10/08/2013   Acute systolic CHF (congestive heart failure) (HCC) 09/16/2013   COPD (chronic obstructive pulmonary disease) (HCC) 09/16/2013   HTN (hypertension) 09/16/2013   Obesity 09/16/2013   PAF (paroxysmal atrial fibrillation) (HCC) 09/16/2013   Senile nuclear sclerosis 05/22/2013   Muscle weakness (generalized) 11/11/2009   Long term current use of anticoagulant therapy 06/17/2009   Pulmonary embolism (HCC) 06/17/2009   ED (erectile dysfunction) of organic origin 05/13/2003    Precious Bard, PT, DPT  12/14/2020, 8:45 AM  [facility] MAIN [facility] SERVICES [address] [place], [place], [address] Phone: [phone]   Fax:  [phone]  Name: Artavis Cowie MRN: [id number] Date of Birth: [birth year]

## 2020-12-16 ENCOUNTER — Other Ambulatory Visit: Payer: Self-pay

## 2020-12-16 ENCOUNTER — Ambulatory Visit: Payer: Medicare Other

## 2020-12-16 DIAGNOSIS — R262 Difficulty in walking, not elsewhere classified: Secondary | ICD-10-CM

## 2020-12-16 DIAGNOSIS — R269 Unspecified abnormalities of gait and mobility: Secondary | ICD-10-CM | POA: Diagnosis not present

## 2020-12-16 DIAGNOSIS — M6281 Muscle weakness (generalized): Secondary | ICD-10-CM

## 2020-12-16 NOTE — Therapy (Signed)
[facility] MAIN [facility] SERVICES [address] [place], [place], [address] Phone: [phone]   Fax:  [phone]  Physical Therapy Treatment  Patient Details  Name: Mario [name] MRN: [id number] Date of Birth: 1946-02-21 Referring Provider (PT): [name], [name], NP   Encounter Date: 12/16/2020   PT End of Session - 12/16/20 0815     Visit Number 5    Number of Visits 17    Date for PT Re-Evaluation 01/25/21    Authorization - Visit Number 5    Authorization - Number of Visits 10    PT Start Time 0807    PT Stop Time 0846    PT Time Calculation (min) 39 min    Equipment Utilized During Treatment Gait belt    Activity Tolerance Patient tolerated treatment well;Treatment limited secondary to medical complications (Comment);Other (comment)   Elevated BP readings and O2 levels limiting eval testing.            Past Medical History:  Diagnosis Date   Arthritis    hands, knees   Atrial fibrillation (HCC)    CHF (congestive heart failure) (HCC)    COPD (chronic obstructive pulmonary disease) (HCC)    Diabetes mellitus without complication (HCC)    Diabetic neuropathy (HCC)    Heart murmur    in past, mild 1969   Hypertension    resolved after weight loss and more activity   Neuromuscular disorder (HCC)    neuropathy   Neuropathy    Pulmonary embolism (HCC)    approx 2014   Shingles '99   Sleep apnea    CPAP   Wears dentures    full upper and lower    Past Surgical History:  Procedure Laterality Date   CATARACT EXTRACTION W/PHACO Left 05/12/2019   Procedure: CATARACT EXTRACTION PHACO AND INTRAOCULAR LENS PLACEMENT (IOC) LEFTDIABETIC ISTENT INJ 2.43  00:28.6;  Surgeon: Nevada Crane, MD;  Location: [facility];  Service: Ophthalmology;  Laterality: Left;  Diabetic - oral meds sleep apnea   CATARACT EXTRACTION W/PHACO Right 06/02/2019   Procedure: CATARACT EXTRACTION PHACO AND INTRAOCULAR LENS PLACEMENT (IOC)  ISTENT INJ  RIGHT DIABETIC 1.31  00:25.4;  Surgeon: Nevada Crane, MD;  Location: [facility];  Service: Ophthalmology;  Laterality: Right;   COLONOSCOPY  08/30/2015   FINGER SURGERY Right    3'rd finger, right hand   KNEE SURGERY Right    screw in knee   WRIST SURGERY Left    metal removed    There were no vitals filed for this visit.   Subjective Assessment - 12/16/20 0810     Subjective Patient reports having a rough day today and yesterday. Took his medication.    Pertinent History Pt is a 75 y.o. male referred to OPPT for LE weakness, bilat join OA, and diabetic peripheral neuropathy. PMH includes: Paroxysmal A-fib, COPD, DM Type 2, CHF, OA of bilat knees, HTN, glaucoma of both eyes. Pt reports losing weight 265 lbs to 210 lbs and this is when he noticed he started having generalized weakness and knee pain. Has received steroid injections which temporarily helped (~1 year ago). Pt having difficulty in bowling for fun, walking to complete community tasks and ADL's (requires electric buggy for shopping). Reports walking ~ 12 meters. Pt does report NO falls besides slipping on lane with bowling. Has had near falls but has used walls to catch himself. Pt reports being on 2 L/min for COPD, denies any balance impairments. Pt's  goals for PT are to return to bowling, and complete community walking tasks without having to take frequent seated rest breaks.    Limitations Lifting;Standing;Walking;House hold activities    How long can you sit comfortably? unlimited    How long can you stand comfortably? 3 minutes    How long can you walk comfortably? length of hallway in gym (10-12 m)    Patient Stated Goals Return to community ambulation.    Currently in Pain? No/denies                  BP at start of session: 181/96    BP five minutes into session: 166/86    Treatment: Standing in // bars: Three cone taps 8x each LE; L knee buckling on final attempt, BUE support    Standing with # 3lb ankle weight: CGA for stability  -Hip extension with BUE upper extremity support, cueing for neutral hip alignment, upright posture for optimal muscle recruitment, and sequencing, 10x each LE,  -Hip abduction with BUE upper extremity support, cueing for neutral foot alignment for correct muscle activation, 10x each LE -Hip flexion with BUE upper extremity support, cueing for body mechanics, speed of muscle recruitment for optimal strengthening and stabilization 10x each LE -Hamstring curl with BUE upper extremity support, cueing for knee alignment for recruitment of hamstring musculature, 10x each LE   Seated with # 3lb ankle weights  -Seated marches with upright posture, back away from back of chair for abdominal/trunk activation/stabilization, 10x each LE -Seated LAQ with 3 second holds, 10x each LE, cueing for muscle activation and sequencing for neutral alignment -Seated IR/ER with cueing for stabilizing knee placement with lateral foot movement for optimal muscle recruitment, 10x each LE -seated heel raise 10x, 5 second holds at top of range     Seated: Heel toe rockerboard 20x  GTB around bilateral knees: abduction 20x with focus on   Patient's vitals monitored throughout session with rest breaks provided to return Sp02 to therapeutic range.  3L of 02 via nasal cannula during exercises required.    Pt educated throughout session about proper posture and technique with exercises. Improved exercise technique, movement at target joints, use of target muscles after min to mod verbal, visual, tactile cues    Patient arrived late to PT session limiting session duration. Patient is highly motivated throughout session despite l knee discomfort during standing interventions. He requires occasional seated rest breaks due to fatigue and constant monitoring of vitals to remain in therapeutic range. HEP printed out with patient demonstrating understanding. Patient will  benefit from skilled physical therapy improve patient's ambulation capacity, mobility, strength, and independence with IADLs/ADLS.   Access Code: [email or web address] URL: [email or web address] Date: 12/16/2020 Prepared by: Precious BardMarina Zailee Vallely  Exercises Standing March with Counter Support - 1 x daily - 7 x weekly - 2 sets - 10 reps - 5 hold Standing Hip Extension with Counter Support - 1 x daily - 7 x weekly - 2 sets - 10 reps - 5 hold Standing Hip Abduction with Counter Support - 1 x daily - 7 x weekly - 2 sets - 10 reps - 5 hold Standing Knee Flexion - 1 x daily - 7 x weekly - 2 sets - 10 reps - 5 hold Seated March - 1 x daily - 7 x weekly - 2 sets - 10 reps - 5 hold Seated Long Arc Quad - 1 x daily - 7 x weekly - 2 sets - 10 reps - 5 hold Seated  Hip Internal Rotation AROM - 1 x daily - 7 x weekly - 2 sets - 10 reps - 5 hold Seated Heel Raise - 1 x daily - 7 x weekly - 2 sets - 10 reps - 5 hold               PT Education - 12/16/20 0814     Education Details blood pressure monitoring, body mechanics    Person(s) Educated Patient    Methods Explanation;Demonstration;Tactile cues;Verbal cues    Comprehension Verbalized understanding;Returned demonstration;Verbal cues required;Tactile cues required              PT Short Term Goals - 11/30/20 1101       PT SHORT TERM GOAL #1   Title Pt will be independent with HEP to improve strength, balance, endurance to improve community ADL's.    Baseline 8/2: Initiate next session    Time 4    Period Weeks    Status New    Target Date 12/28/20               PT Long Term Goals - 11/30/20 1103       PT LONG TERM GOAL #1   Title Pt will increase FOTO to target to demonstrate improved ability to complete functional mobility and ADL's.    Baseline 8/2: 48/64    Time 8    Period Weeks    Status New    Target Date 01/25/21      PT LONG TERM GOAL #2   Title Pt will improve 5xSTS time to < 10 sec to demonstrate  clinically significant improvement in BLE strength.    Baseline 8/2: 13.8 sec    Time 8    Period Weeks    Status New    Target Date 01/25/21      PT LONG TERM GOAL #3   Title Pt will improve gait speed to at least 1.0 m/s to display evidenced norms to safely complete community wlaking tasks with decreased risk of falls.    Baseline 8/2: 0.678 m/s    Time 8    Period Weeks    Status New    Target Date 01/25/21      PT LONG TERM GOAL #4   Title Pt will improve to at least 1000' to demo ability to complete community distance walking tasks.    Baseline 8/2: Deferred due to BP readings.    Time 8    Period Weeks    Status New    Target Date 01/25/21                   Plan - 12/16/20 0836     Clinical Impression Statement Patient arrived late to PT session limiting session duration. Patient is highly motivated throughout session despite l knee discomfort during standing interventions. He requires occasional seated rest breaks due to fatigue and constant monitoring of vitals to remain in therapeutic range. HEP printed out with patient demonstrating understanding. Patient will benefit from skilled physical therapy improve patient's ambulation capacity, mobility, strength, and independence with IADLs/ADLS.    Personal Factors and Comorbidities Time since onset of injury/illness/exacerbation;Age;Comorbidity 3+;Education;Past/Current Experience;Fitness    Comorbidities PMH includes: Paroxysmal A-fib, COPD, DM Type 2, CHF, OA of bilat knees, HTN, glaucoma of both eyes.    Examination-Activity Limitations Stairs;Squat;Stand;Locomotion Level    Examination-Participation Restrictions Community Activity    Stability/Clinical Decision Making Evolving/Moderate complexity    Rehab Potential Fair    PT Frequency  2x / week    PT Duration 8 weeks    PT Treatment/Interventions ADLs/Self Care Home Management;Cryotherapy;Gait training;Therapeutic exercise;Patient/family education;Manual  techniques;Passive range of motion;Spinal Manipulations;Dry needling;Aquatic Therapy;Electrical Stimulation;Stair training;Functional mobility training;Neuromuscular re-education;Therapeutic activities;Balance training;Energy conservation;Moist Heat    PT Next Visit Plan BP, strengthening    PT Home Exercise Plan Next session    Consulted and Agree with Plan of Care Patient             Patient will benefit from skilled therapeutic intervention in order to improve the following deficits and impairments:  Abnormal gait, Improper body mechanics, Decreased mobility, Decreased activity tolerance, Decreased balance, Decreased endurance, Decreased range of motion, Decreased strength, Difficulty walking, Decreased safety awareness, Impaired vision/preception  Visit Diagnosis: Abnormality of gait and mobility  Difficulty in walking, not elsewhere classified  Muscle weakness (generalized)     Problem List Patient Active Problem List   Diagnosis Date Noted   Bilateral primary osteoarthritis of knee 06/11/2019   Cervical radicular pain 06/11/2019   Cervical facet joint syndrome 06/11/2019   Lumbar facet arthropathy 06/11/2019   Lumbar radiculopathy 06/11/2019   Chronic pain syndrome 06/11/2019   Lung nodule 02/09/2016   Exercise hypoxemia 12/28/2015   Chronic obstructive pulmonary disease (HCC) 03/16/2015   Nuclear sclerosis 03/16/2015   OSA (obstructive sleep apnea) 03/16/2015   Primary open angle glaucoma of both eyes, severe stage 03/16/2015   Type 2 diabetes mellitus without complication, without long-term current use of insulin (HCC) 03/16/2015   Arthralgia of left lower leg 10/29/2014   Osteoarthritis of left knee 09/10/2014   Type 2 diabetes mellitus with diabetic neuropathy (HCC) 08/04/2014   Cataract, nuclear sclerotic senile 04/01/2014   Combined forms of age-related cataract 04/01/2014   Keratitis sicca, bilateral (HCC) 04/01/2014   Primary open angle glaucoma 02/27/2014    Flat feet 01/29/2014   Foot pain, bilateral 01/29/2014   Tinea pedis of both feet 01/29/2014   Ptosis of both eyelids 11/25/2013   Type 2 diabetes mellitus without retinopathy (HCC) 11/25/2013   Onychomycosis 11/19/2013   Chronic systolic heart failure (HCC) 11/11/2013   Emphysema of lung (HCC) 11/11/2013   Essential hypertension 11/11/2013   Screen for colon cancer 11/05/2013   Cardiomyopathy (HCC) 10/08/2013   Systolic HF (heart failure) (HCC) 10/08/2013   Acute systolic CHF (congestive heart failure) (HCC) 09/16/2013   COPD (chronic obstructive pulmonary disease) (HCC) 09/16/2013   HTN (hypertension) 09/16/2013   Obesity 09/16/2013   PAF (paroxysmal atrial fibrillation) (HCC) 09/16/2013   Senile nuclear sclerosis 05/22/2013   Muscle weakness (generalized) 11/11/2009   Long term current use of anticoagulant therapy 06/17/2009   Pulmonary embolism (HCC) 06/17/2009   ED (erectile dysfunction) of organic origin 05/13/2003    Precious Bard, PT, DPT  12/16/2020, 8:55 AM  [facility] MAIN [facility] SERVICES [address] [place], [place], [address] Phone: [phone]   Fax:  [phone]  Name: Mario [name] MRN: [id number] Date of Birth: 10/14/1945

## 2020-12-21 ENCOUNTER — Ambulatory Visit: Payer: Medicare Other

## 2020-12-21 ENCOUNTER — Other Ambulatory Visit: Payer: Self-pay

## 2020-12-21 DIAGNOSIS — R262 Difficulty in walking, not elsewhere classified: Secondary | ICD-10-CM

## 2020-12-21 DIAGNOSIS — R269 Unspecified abnormalities of gait and mobility: Secondary | ICD-10-CM | POA: Diagnosis not present

## 2020-12-21 DIAGNOSIS — M6281 Muscle weakness (generalized): Secondary | ICD-10-CM

## 2020-12-21 NOTE — Therapy (Signed)
[facility] [address] [place], [place], [address] Phone: [phone]   Fax:  [phone]  Physical Therapy Treatment  Patient Details  Name: Mario [name] MRN: [id number] Date of Birth: April 24, 1946 Referring Provider (PT): [name], Hermenia FiscalSarah Kathryn, NP   Encounter Date: 12/21/2020   PT End of Session - 12/21/20 0937     Visit Number 6    Number of Visits 17    Date for PT Re-Evaluation 01/25/21    Authorization - Visit Number 6    Authorization - Number of Visits 10    PT Start Time 0810    PT Stop Time 0849    PT Time Calculation (min) 39 min    Equipment Utilized During Treatment Gait belt    Activity Tolerance Patient tolerated treatment well;Treatment limited secondary to medical complications (Comment);Other (comment)   Elevated BP readings and O2 levels limiting eval testing.            Past Medical History:  Diagnosis Date   Arthritis    hands, knees   Atrial fibrillation (HCC)    CHF (congestive heart failure) (HCC)    COPD (chronic obstructive pulmonary disease) (HCC)    Diabetes mellitus without complication (HCC)    Diabetic neuropathy (HCC)    Heart murmur    in past, mild 1969   Hypertension    resolved after weight loss and more activity   Neuromuscular disorder (HCC)    neuropathy   Neuropathy    Pulmonary embolism (HCC)    approx 2014   Shingles '99   Sleep apnea    CPAP   Wears dentures    full upper and lower    Past Surgical History:  Procedure Laterality Date   CATARACT EXTRACTION W/PHACO Left 05/12/2019   Procedure: CATARACT EXTRACTION PHACO AND INTRAOCULAR LENS PLACEMENT (IOC) LEFTDIABETIC ISTENT INJ 2.43  00:28.6;  Surgeon: [place] CraneKing, Bradley Mark, MD;  [facility];  Service: Ophthalmology;  Laterality: Left;  Diabetic - oral meds sleep apnea   CATARACT EXTRACTION W/PHACO Right 06/02/2019   Procedure: CATARACT EXTRACTION PHACO AND INTRAOCULAR LENS PLACEMENT (IOC)  ISTENT INJ  RIGHT DIABETIC 1.31  00:25.4;  Surgeon: [place] CraneKing, Bradley Mark, MD;  [facility];  Service: Ophthalmology;  Laterality: Right;   COLONOSCOPY  08/30/2015   FINGER SURGERY Right    3'rd finger, right hand   KNEE SURGERY Right    screw in knee   WRIST SURGERY Left    metal removed    There were no vitals filed for this visit.   Subjective Assessment - 12/21/20 0938     Subjective Patient is late to PT session, reports traffic was bad. Remembered to bring his oxygen extender cord    Pertinent History Pt is a 75 y.o. male referred to OPPT for LE weakness, bilat join OA, and diabetic peripheral neuropathy. PMH includes: Paroxysmal A-fib, COPD, DM Type 2, CHF, OA of bilat knees, HTN, glaucoma of both eyes. Pt reports losing weight 265 lbs to 210 lbs and this is when he noticed he started having generalized weakness and knee pain. Has received steroid injections which temporarily helped (~1 year ago). Pt having difficulty in bowling for fun, walking to complete community tasks and ADL's (requires electric buggy for shopping). Reports walking ~ 12 meters. Pt does report NO falls besides slipping on lane with bowling. Has had near falls but has used walls to catch himself. Pt reports being on 2 L/min for COPD,  denies any balance impairments. Pt's goals for PT are to return to bowling, and complete community walking tasks without having to take frequent seated rest breaks.    Limitations Lifting;Standing;Walking;House hold activities    How long can you sit comfortably? unlimited    How long can you stand comfortably? 3 minutes    How long can you walk comfortably? length of hallway in gym (10-12 m)    Patient Stated Goals Return to community ambulation.    Currently in Pain? No/denies                 BP at start of session: 150/89        Treatment: Standing in // bars: Three cone taps 8x each LE; L knee buckling on final attempt, BUE support  Step over orange  hurdle 12x each LE; BUE support Lateral step over hurdle 12x each side; BUE support   Bosu ball: round side up: modified forward lunges 12x each LE, BUE support Bosu ball: round side up: modified lateral lunge 12x each LE, BUE support cues for weight shift Bosu ball: flat side up static stand 30 seconds  6" step : crane step ups 10x each LE; heavy BUE support; oxygen depleted to 82 % Sp02, required sudden stop to replenish SP02 to >90%  Standing heel raise 25x with BUE support  Ambulate 96 ft around edge of PT gym, rolling oxygen, one near LOB; patient very fatigued by end of ambulation. Sp02 drop to 88%    Seated: Sit to stand 10x  Heel toe rockerboard 20x     Patient's vitals monitored throughout session with rest breaks provided to return Sp02 to therapeutic range.  3L of 02 via nasal cannula during exercises required.    Pt educated throughout session about proper posture and technique with exercises. Improved exercise technique, movement at target joints, use of target muscles after min to mod verbal, visual, tactile cues    Patient's Sp02 dropped this session requiring rest break with cueing for breathing sequencing and correction of mask wear for return to >90%. Patient fatigues with prolonged muscle recruitment such as walking and steps requiring cueing for sequencing and breathign for optimal recruitment patterning. Patient kept after session due to late arrival to ensure full session. Patient will benefit from skilled physical therapy improve patient's ambulation capacity, mobility, strength, and independence with IADLs/ADLS.                 PT Education - 12/21/20 0939     Education Details exercise technique, body mechanics, breathing technique    Person(s) Educated Patient    Methods Explanation;Demonstration;Tactile cues;Verbal cues    Comprehension Verbalized understanding;Returned demonstration;Verbal cues required;Tactile cues required               PT Short Term Goals - 11/30/20 1101       PT SHORT TERM GOAL #1   Title Pt will be independent with HEP to improve strength, balance, endurance to improve community ADL's.    Baseline 8/2: Initiate next session    Time 4    Period Weeks    Status New    Target Date 12/28/20               PT Long Term Goals - 11/30/20 1103       PT LONG TERM GOAL #1   Title Pt will increase FOTO to target to demonstrate improved ability to complete functional mobility and ADL's.    Baseline 8/2: 48/64  Time 8    Period Weeks    Status New    Target Date 01/25/21      PT LONG TERM GOAL #2   Title Pt will improve 5xSTS time to < 10 sec to demonstrate clinically significant improvement in BLE strength.    Baseline 8/2: 13.8 sec    Time 8    Period Weeks    Status New    Target Date 01/25/21      PT LONG TERM GOAL #3   Title Pt will improve gait speed to at least 1.0 m/s to display evidenced norms to safely complete community wlaking tasks with decreased risk of falls.    Baseline 8/2: 0.678 m/s    Time 8    Period Weeks    Status New    Target Date 01/25/21      PT LONG TERM GOAL #4   Title Pt will improve to at least 1000' to demo ability to complete community distance walking tasks.    Baseline 8/2: Deferred due to BP readings.    Time 8    Period Weeks    Status New    Target Date 01/25/21                   Plan - 12/21/20 0939     Clinical Impression Statement Patient's Sp02 dropped this session requiring rest break with cueing for breathing sequencing and correction of mask wear for return to >90%. Patient fatigues with prolonged muscle recruitment such as walking and steps requiring cueing for sequencing and breathign for optimal recruitment patterning. Patient kept after session due to late arrival to ensure full session. Patient will benefit from skilled physical therapy improve patient's ambulation capacity, mobility, strength, and independence with  IADLs/ADLS.    Personal Factors and Comorbidities Time since onset of injury/illness/exacerbation;Age;Comorbidity 3+;Education;Past/Current Experience;Fitness    Comorbidities PMH includes: Paroxysmal A-fib, COPD, DM Type 2, CHF, OA of bilat knees, HTN, glaucoma of both eyes.    Examination-Activity Limitations Stairs;Squat;Stand;Locomotion Level    Examination-Participation Restrictions Community Activity    Stability/Clinical Decision Making Evolving/Moderate complexity    Rehab Potential Fair    PT Frequency 2x / week    PT Duration 8 weeks    PT Treatment/Interventions ADLs/Self Care Home Management;Cryotherapy;Gait training;Therapeutic exercise;Patient/family education;Manual techniques;Passive range of motion;Spinal Manipulations;Dry needling;Aquatic Therapy;Electrical Stimulation;Stair training;Functional mobility training;Neuromuscular re-education;Therapeutic activities;Balance training;Energy conservation;Moist Heat    PT Next Visit Plan BP, strengthening    PT Home Exercise Plan Next session    Consulted and Agree with Plan of Care Patient             Patient will benefit from skilled therapeutic intervention in order to improve the following deficits and impairments:  Abnormal gait, Improper body mechanics, Decreased mobility, Decreased activity tolerance, Decreased balance, Decreased endurance, Decreased range of motion, Decreased strength, Difficulty walking, Decreased safety awareness, Impaired vision/preception  Visit Diagnosis: Abnormality of gait and mobility  Difficulty in walking, not elsewhere classified  Muscle weakness (generalized)     Problem List Patient Active Problem List   Diagnosis Date Noted   Bilateral primary osteoarthritis of knee 06/11/2019   Cervical radicular pain 06/11/2019   Cervical facet joint syndrome 06/11/2019   Lumbar facet arthropathy 06/11/2019   Lumbar radiculopathy 06/11/2019   Chronic pain syndrome 06/11/2019   Lung nodule  02/09/2016   Exercise hypoxemia 12/28/2015   Chronic obstructive pulmonary disease (HCC) 03/16/2015   Nuclear sclerosis 03/16/2015   OSA (obstructive sleep apnea) 03/16/2015  Primary open angle glaucoma of both eyes, severe stage 03/16/2015   Type 2 diabetes mellitus without complication, without long-term current use of insulin (HCC) 03/16/2015   Arthralgia of left lower leg 10/29/2014   Osteoarthritis of left knee 09/10/2014   Type 2 diabetes mellitus with diabetic neuropathy (HCC) 08/04/2014   Cataract, nuclear sclerotic senile 04/01/2014   Combined forms of age-related cataract 04/01/2014   Keratitis sicca, bilateral (HCC) 04/01/2014   Primary open angle glaucoma 02/27/2014   Flat feet 01/29/2014   Foot pain, bilateral 01/29/2014   Tinea pedis of both feet 01/29/2014   Ptosis of both eyelids 11/25/2013   Type 2 diabetes mellitus without retinopathy (HCC) 11/25/2013   Onychomycosis 11/19/2013   Chronic systolic heart failure (HCC) 11/11/2013   Emphysema of lung (HCC) 11/11/2013   Essential hypertension 11/11/2013   Screen for colon cancer 11/05/2013   Cardiomyopathy (HCC) 10/08/2013   Systolic HF (heart failure) (HCC) 10/08/2013   Acute systolic CHF (congestive heart failure) (HCC) 09/16/2013   COPD (chronic obstructive pulmonary disease) (HCC) 09/16/2013   HTN (hypertension) 09/16/2013   Obesity 09/16/2013   PAF (paroxysmal atrial fibrillation) (HCC) 09/16/2013   Senile nuclear sclerosis 05/22/2013   Muscle weakness (generalized) 11/11/2009   Long term current use of anticoagulant therapy 06/17/2009   Pulmonary embolism (HCC) 06/17/2009   ED (erectile dysfunction) of organic origin 05/13/2003    Precious Bard, PT, DPT  12/21/2020, 9:40 AM  [place] [facility] MAIN [facility] SERVICES [address] [place], [place], [address] Phone: [phone]   Fax:  [phone]  Name: Mario [name] MRN: [id number] Date of Birth: [birth year]

## 2020-12-23 ENCOUNTER — Other Ambulatory Visit: Payer: Self-pay

## 2020-12-23 ENCOUNTER — Ambulatory Visit: Payer: Medicare Other

## 2020-12-23 DIAGNOSIS — R262 Difficulty in walking, not elsewhere classified: Secondary | ICD-10-CM

## 2020-12-23 DIAGNOSIS — R269 Unspecified abnormalities of gait and mobility: Secondary | ICD-10-CM | POA: Diagnosis not present

## 2020-12-23 DIAGNOSIS — M6281 Muscle weakness (generalized): Secondary | ICD-10-CM

## 2020-12-23 NOTE — Therapy (Signed)
[facility] MAIN [facility] SERVICES [address] [place], [place], [address] Phone: [phone]   Fax:  [phone]  Physical Therapy Treatment  Patient Details  Name: Mario [name] MRN: [id number] Date of Birth: 01-27-1946 Referring Provider (PT): [name], [name], NP   Encounter Date: 12/23/2020   PT End of Session - 12/23/20 0752     Visit Number 7    Number of Visits 17    Date for PT Re-Evaluation 01/25/21    Authorization - Visit Number 7    Authorization - Number of Visits 10    PT Start Time 0759    PT Stop Time 0844    PT Time Calculation (min) 45 min    Equipment Utilized During Treatment Gait belt    Activity Tolerance Patient tolerated treatment well;Treatment limited secondary to medical complications (Comment);Other (comment)   Elevated BP readings and O2 levels limiting eval testing.            Past Medical History:  Diagnosis Date   Arthritis    hands, knees   Atrial fibrillation (HCC)    CHF (congestive heart failure) (HCC)    COPD (chronic obstructive pulmonary disease) (HCC)    Diabetes mellitus without complication (HCC)    Diabetic neuropathy (HCC)    Heart murmur    in past, mild 1969   Hypertension    resolved after weight loss and more activity   Neuromuscular disorder (HCC)    neuropathy   Neuropathy    Pulmonary embolism (HCC)    approx 2014   Shingles '99   Sleep apnea    CPAP   Wears dentures    full upper and lower    Past Surgical History:  Procedure Laterality Date   CATARACT EXTRACTION W/PHACO Left 05/12/2019   Procedure: CATARACT EXTRACTION PHACO AND INTRAOCULAR LENS PLACEMENT (IOC) LEFTDIABETIC ISTENT INJ 2.43  00:28.6;  Surgeon: Nevada Crane, MD;  Location: [facility];  Service: Ophthalmology;  Laterality: Left;  Diabetic - oral meds sleep apnea   CATARACT EXTRACTION W/PHACO Right 06/02/2019   Procedure: CATARACT EXTRACTION PHACO AND INTRAOCULAR LENS PLACEMENT (IOC)  ISTENT INJ  RIGHT DIABETIC 1.31  00:25.4;  Surgeon: Nevada Crane, MD;  Location: [facility];  Service: Ophthalmology;  Laterality: Right;   COLONOSCOPY  08/30/2015   FINGER SURGERY Right    3'rd finger, right hand   KNEE SURGERY Right    screw in knee   WRIST SURGERY Left    metal removed    There were no vitals filed for this visit.   Subjective Assessment - 12/23/20 0803     Subjective Patient reports he has been compliant with HEP. Had some bad knee pain when doing his exercises.    Pertinent History Pt is a 75 y.o. male referred to OPPT for LE weakness, bilat join OA, and diabetic peripheral neuropathy. PMH includes: Paroxysmal A-fib, COPD, DM Type 2, CHF, OA of bilat knees, HTN, glaucoma of both eyes. Pt reports losing weight 265 lbs to 210 lbs and this is when he noticed he started having generalized weakness and knee pain. Has received steroid injections which temporarily helped (~1 year ago). Pt having difficulty in bowling for fun, walking to complete community tasks and ADL's (requires electric buggy for shopping). Reports walking ~ 12 meters. Pt does report NO falls besides slipping on lane with bowling. Has had near falls but has used walls to catch himself. Pt reports being on 2 L/min for COPD,  denies any balance impairments. Pt's goals for PT are to return to bowling, and complete community walking tasks without having to take frequent seated rest breaks.    Limitations Lifting;Standing;Walking;House hold activities    How long can you sit comfortably? unlimited    How long can you stand comfortably? 3 minutes    How long can you walk comfortably? length of hallway in gym (10-12 m)    Patient Stated Goals Return to community ambulation.    Currently in Pain? Yes    Pain Score 5     Pain Location Knee    Pain Orientation Left    Pain Descriptors / Indicators Aching    Pain Type Chronic pain    Pain Onset Yesterday    Pain Frequency Intermittent                   BP at start of session: 152/83         Treatment: Nustep Lvl 3-4  seat position 9; cues for keeping SPM>60; 4 minutes for cardiovascular support     Standing with # 3lb ankle weight: CGA for stability  -Hip extension with BUE upper extremity support, cueing for neutral hip alignment, upright posture for optimal muscle recruitment, and sequencing, 10x each LE,  -Hip abduction with BUE upper extremity support, cueing for neutral foot alignment for correct muscle activation, 10x each LE; HR elevation to 152.  -Hip flexion with BUE upper extremity support, cueing for body mechanics, speed of muscle recruitment for optimal strengthening and stabilization 10x each LE -Hamstring curl with BUE upper extremity support, cueing for knee alignment for recruitment of hamstring musculature, 10x each LE   Seated with # 3lb ankle weights  -Seated marches with upright posture, back away from back of chair for abdominal/trunk activation/stabilization, 10x each LE -Seated LAQ with 3 second holds, 10x each LE, cueing for muscle activation and sequencing for neutral alignment -Seated IR/ER with cueing for stabilizing knee placement with lateral foot movement for optimal muscle recruitment, 10x each LE -seated heel raise 15x    Ambulate 96 ft around edge of PT gym, rolling oxygen, one near LOB; patient very fatigued by end of ambulation. Sp02 drop to 88%    Seated: Sit to stand 5x RTB hamstring curl 15x each LE cue for decreased velocity RTB around bilateral toes: march with dorsiflexion 12x each LE       Patient's vitals monitored throughout session with rest breaks provided to return Sp02 to therapeutic range.  3L of 02 via nasal cannula during exercises required.    Pt educated throughout session about proper posture and technique with exercises. Improved exercise technique, movement at target joints, use of target muscles after min to mod verbal, visual, tactile cues   Patient  remains highly motivated throughout physical therapy session. Increased focus on prolonged muscle activation for tolerance of activities performed with patient requiring occasional seated rest breaks. Progressive strengthening interventions performed with occasional left knee pain noted requiring mixing of seated and standing interventions. Monitoring of vitals required due to elevated HR and Sp02 levels. Patient will benefit from skilled physical therapy improve patient's ambulation capacity, mobility, strength, and independence with IADLs/ADLS                  PT Education - 12/23/20 0752     Education Details exercise technique, body mechanics,    Person(s) Educated Patient    Methods Explanation;Demonstration;Tactile cues;Verbal cues    Comprehension Verbalized understanding;Returned demonstration;Verbal cues required;Tactile  cues required              PT Short Term Goals - 11/30/20 1101       PT SHORT TERM GOAL #1   Title Pt will be independent with HEP to improve strength, balance, endurance to improve community ADL's.    Baseline 8/2: Initiate next session    Time 4    Period Weeks    Status New    Target Date 12/28/20               PT Long Term Goals - 11/30/20 1103       PT LONG TERM GOAL #1   Title Pt will increase FOTO to target to demonstrate improved ability to complete functional mobility and ADL's.    Baseline 8/2: 48/64    Time 8    Period Weeks    Status New    Target Date 01/25/21      PT LONG TERM GOAL #2   Title Pt will improve 5xSTS time to < 10 sec to demonstrate clinically significant improvement in BLE strength.    Baseline 8/2: 13.8 sec    Time 8    Period Weeks    Status New    Target Date 01/25/21      PT LONG TERM GOAL #3   Title Pt will improve gait speed to at least 1.0 m/s to display evidenced norms to safely complete community wlaking tasks with decreased risk of falls.    Baseline 8/2: 0.678 m/s    Time 8     Period Weeks    Status New    Target Date 01/25/21      PT LONG TERM GOAL #4   Title Pt will improve to at least 1000' to demo ability to complete community distance walking tasks.    Baseline 8/2: Deferred due to BP readings.    Time 8    Period Weeks    Status New    Target Date 01/25/21                   Plan - 12/23/20 0819     Clinical Impression Statement Patient remains highly motivated throughout physical therapy session. Increased focus on prolonged muscle activation for tolerance of activities performed with patient requiring occasional seated rest breaks. Progressive strengthening interventions performed with occasional left knee pain noted requiring mixing of seated and standing interventions. Monitoring of vitals required due to elevated HR and Sp02 levels. Patient will benefit from skilled physical therapy improve patient's ambulation capacity, mobility, strength, and independence with IADLs/ADLS    Personal Factors and Comorbidities Time since onset of injury/illness/exacerbation;Age;Comorbidity 3+;Education;Past/Current Experience;Fitness    Comorbidities PMH includes: Paroxysmal A-fib, COPD, DM Type 2, CHF, OA of bilat knees, HTN, glaucoma of both eyes.    Examination-Activity Limitations Stairs;Squat;Stand;Locomotion Level    Examination-Participation Restrictions Community Activity    Stability/Clinical Decision Making Evolving/Moderate complexity    Rehab Potential Fair    PT Frequency 2x / week    PT Duration 8 weeks    PT Treatment/Interventions ADLs/Self Care Home Management;Cryotherapy;Gait training;Therapeutic exercise;Patient/family education;Manual techniques;Passive range of motion;Spinal Manipulations;Dry needling;Aquatic Therapy;Electrical Stimulation;Stair training;Functional mobility training;Neuromuscular re-education;Therapeutic activities;Balance training;Energy conservation;Moist Heat    PT Next Visit Plan BP, strengthening    PT Home  Exercise Plan Next session    Consulted and Agree with Plan of Care Patient             Patient will benefit from skilled therapeutic intervention  in order to improve the following deficits and impairments:  Abnormal gait, Improper body mechanics, Decreased mobility, Decreased activity tolerance, Decreased balance, Decreased endurance, Decreased range of motion, Decreased strength, Difficulty walking, Decreased safety awareness, Impaired vision/preception  Visit Diagnosis: Abnormality of gait and mobility  Difficulty in walking, not elsewhere classified  Muscle weakness (generalized)     Problem List Patient Active Problem List   Diagnosis Date Noted   Bilateral primary osteoarthritis of knee 06/11/2019   Cervical radicular pain 06/11/2019   Cervical facet joint syndrome 06/11/2019   Lumbar facet arthropathy 06/11/2019   Lumbar radiculopathy 06/11/2019   Chronic pain syndrome 06/11/2019   Lung nodule 02/09/2016   Exercise hypoxemia 12/28/2015   Chronic obstructive pulmonary disease (HCC) 03/16/2015   Nuclear sclerosis 03/16/2015   OSA (obstructive sleep apnea) 03/16/2015   Primary open angle glaucoma of both eyes, severe stage 03/16/2015   Type 2 diabetes mellitus without complication, without long-term current use of insulin (HCC) 03/16/2015   Arthralgia of left lower leg 10/29/2014   Osteoarthritis of left knee 09/10/2014   Type 2 diabetes mellitus with diabetic neuropathy (HCC) 08/04/2014   Cataract, nuclear sclerotic senile 04/01/2014   Combined forms of age-related cataract 04/01/2014   Keratitis sicca, bilateral (HCC) 04/01/2014   Primary open angle glaucoma 02/27/2014   Flat feet 01/29/2014   Foot pain, bilateral 01/29/2014   Tinea pedis of both feet 01/29/2014   Ptosis of both eyelids 11/25/2013   Type 2 diabetes mellitus without retinopathy (HCC) 11/25/2013   Onychomycosis 11/19/2013   Chronic systolic heart failure (HCC) 11/11/2013   Emphysema of lung  (HCC) 11/11/2013   Essential hypertension 11/11/2013   Screen for colon cancer 11/05/2013   Cardiomyopathy (HCC) 10/08/2013   Systolic HF (heart failure) (HCC) 10/08/2013   Acute systolic CHF (congestive heart failure) (HCC) 09/16/2013   COPD (chronic obstructive pulmonary disease) (HCC) 09/16/2013   HTN (hypertension) 09/16/2013   Obesity 09/16/2013   PAF (paroxysmal atrial fibrillation) (HCC) 09/16/2013   Senile nuclear sclerosis 05/22/2013   Muscle weakness (generalized) 11/11/2009   Long term current use of anticoagulant therapy 06/17/2009   Pulmonary embolism (HCC) 06/17/2009   ED (erectile dysfunction) of organic origin 05/13/2003    Precious BardMarina Azalia Neuberger, PT, DPT  12/23/2020, 8:58 AM  [facility] [address] [place], [place], [address] Phone: [phone]   Fax:  [phone]  Name: Mario LeepOrlando [name] MRN: [id number] Date of Birth: 05-Feb-1946

## 2020-12-28 ENCOUNTER — Ambulatory Visit: Payer: Medicare Other

## 2020-12-30 ENCOUNTER — Ambulatory Visit: Payer: Medicare Other

## 2021-01-04 ENCOUNTER — Ambulatory Visit: Payer: Medicare Other | Attending: Nurse Practitioner

## 2021-01-04 ENCOUNTER — Other Ambulatory Visit: Payer: Self-pay

## 2021-01-04 DIAGNOSIS — R262 Difficulty in walking, not elsewhere classified: Secondary | ICD-10-CM | POA: Diagnosis present

## 2021-01-04 DIAGNOSIS — R269 Unspecified abnormalities of gait and mobility: Secondary | ICD-10-CM | POA: Diagnosis not present

## 2021-01-04 DIAGNOSIS — M6281 Muscle weakness (generalized): Secondary | ICD-10-CM | POA: Diagnosis present

## 2021-01-04 NOTE — Therapy (Signed)
[facility] [address] [place], [place], [address] Phone: [phone]   Fax:  [phone]  Physical Therapy Treatment  Patient Details  Name: Mario LeepOrlando [name] MRN: [id number] Date of Birth: Dec 15, 1945 Referring Provider (PT): [name], Hermenia FiscalSarah Kathryn, NP   Encounter Date: 01/04/2021   PT End of Session - 01/04/21 0753     Visit Number 8    Number of Visits 17    Date for PT Re-Evaluation 01/25/21    Authorization - Visit Number 8    Authorization - Number of Visits 10    PT Start Time 0759    PT Stop Time 0844    PT Time Calculation (min) 45 min    Equipment Utilized During Treatment Gait belt    Activity Tolerance Patient tolerated treatment well;Treatment limited secondary to medical complications (Comment);Other (comment)   Elevated BP readings and O2 levels limiting eval testing.            Past Medical History:  Diagnosis Date   Arthritis    hands, knees   Atrial fibrillation (HCC)    CHF (congestive heart failure) (HCC)    COPD (chronic obstructive pulmonary disease) (HCC)    Diabetes mellitus without complication (HCC)    Diabetic neuropathy (HCC)    Heart murmur    in past, mild 1969   Hypertension    resolved after weight loss and more activity   Neuromuscular disorder (HCC)    neuropathy   Neuropathy    Pulmonary embolism (HCC)    approx 2014   Shingles '99   Sleep apnea    CPAP   Wears dentures    full upper and lower    Past Surgical History:  Procedure Laterality Date   CATARACT EXTRACTION W/PHACO Left 05/12/2019   Procedure: CATARACT EXTRACTION PHACO AND INTRAOCULAR LENS PLACEMENT (IOC) LEFTDIABETIC ISTENT INJ 2.43  00:28.6;  Surgeon: [place] CraneKing, Bradley Mark, MD;  [facility];  Service: Ophthalmology;  Laterality: Left;  Diabetic - oral meds sleep apnea   CATARACT EXTRACTION W/PHACO Right 06/02/2019   Procedure: CATARACT EXTRACTION PHACO AND INTRAOCULAR LENS PLACEMENT (IOC)  ISTENT INJ  RIGHT DIABETIC 1.31  00:25.4;  Surgeon: [place] CraneKing, Bradley Mark, MD;  [facility];  Service: Ophthalmology;  Laterality: Right;   COLONOSCOPY  08/30/2015   FINGER SURGERY Right    3'rd finger, right hand   KNEE SURGERY Right    screw in knee   WRIST SURGERY Left    metal removed    There were no vitals filed for this visit.   Subjective Assessment - 01/04/21 0804     Subjective Patient reports he brought his meds to take, haven't taken them yet. No falls or LOB since last session. L knee is very painful today.    Pertinent History Pt is a 75 y.o. male referred to OPPT for LE weakness, bilat join OA, and diabetic peripheral neuropathy. PMH includes: Paroxysmal A-fib, COPD, DM Type 2, CHF, OA of bilat knees, HTN, glaucoma of both eyes. Pt reports losing weight 265 lbs to 210 lbs and this is when he noticed he started having generalized weakness and knee pain. Has received steroid injections which temporarily helped (~1 year ago). Pt having difficulty in bowling for fun, walking to complete community tasks and ADL's (requires electric buggy for shopping). Reports walking ~ 12 meters. Pt does report NO falls besides slipping on lane with bowling. Has had near falls but has used walls to catch himself.  Pt reports being on 2 L/min for COPD, denies any balance impairments. Pt's goals for PT are to return to bowling, and complete community walking tasks without having to take frequent seated rest breaks.    Limitations Lifting;Standing;Walking;House hold activities    How long can you sit comfortably? unlimited    How long can you stand comfortably? 3 minutes    How long can you walk comfortably? length of hallway in gym (10-12 m)    Patient Stated Goals Return to community ambulation.    Currently in Pain? Yes    Pain Score 7     Pain Location Knee    Pain Orientation Left    Pain Descriptors / Indicators Aching    Pain Type Chronic pain    Pain Onset Yesterday    Pain  Frequency Intermittent              Patient reports he brought his meds to take, haven't taken them yet. No falls or LOB since last session. L knee is very painful today.    BP 136/ 79      Treatment: Nustep Lvl 3-4  seat position 9; cues for keeping SPM>60; 4 minutes for cardiovascular support      Airex pad:  Standing with CGA next to support surface:  Airex pad: static stand 30 seconds x 2 trials, noticeable trembling of ankles/LE's with fatigue and challenge to maintain stability Airex pad: eyes closed NBOS 30 seconds very challenging.  Airex pad: horizontal head turns 30 seconds scanning room 10x ; cueing for arc of motion  Airex pad: vertical head turns 30 seconds, cueing for arc of motion, noticeable sway with upward gaze increasing demand on ankle righting reaction musculature Airex pad: one foot on 6" step one foot on airex pad, hold position for 30 seconds, switch legs, 2x each LE;  Ambulate 96 ft with patient pushing oxygen tank, close CGA, cue for widening BOS required; Sp02 drop to 84%; requires cues for breathing in through nose.     Seated with # 3lb ankle weights  -Seated marches with upright posture, back away from back of chair for abdominal/trunk activation/stabilization, 15x each LE -Seated LAQ with 3 second holds, 10x each LE, cueing for muscle activation and sequencing for neutral alignment -Seated IR/ER with cueing for stabilizing knee placement with lateral foot movement for optimal muscle recruitment, 10x each LE -seated heel raise 15x     Seated: BTB abduction 15x  BTB hamstring curl 15x each LE cue for decreased velocity        Patient's vitals monitored throughout session with rest breaks provided to return Sp02 to therapeutic range.  3L of 02 via nasal cannula during exercises required.    Pt educated throughout session about proper posture and technique with exercises. Improved exercise technique, movement at target joints, use of target  muscles after min to mod verbal, visual, tactile cues   Patient took his medication at start of session to keep his blood pressure in therapeutic range. His knee pain does limit his session this morning. Cueing for task orientiton required throughout session due to tangential and verbose nature of patient. Monitoring of vitals required thougohut session. Patient will benefit from skilled physical therapy improve patient's ambulation capacity, mobility, strength, and independence with IADLs/ADLS                  PT Education - 01/04/21 0752     Education Details exercise technique, body mechanics    Person(s) Educated  Patient    Methods Explanation;Demonstration;Tactile cues;Verbal cues    Comprehension Verbalized understanding;Returned demonstration;Verbal cues required;Tactile cues required              PT Short Term Goals - 11/30/20 1101       PT SHORT TERM GOAL #1   Title Pt will be independent with HEP to improve strength, balance, endurance to improve community ADL's.    Baseline 8/2: Initiate next session    Time 4    Period Weeks    Status New    Target Date 12/28/20               PT Long Term Goals - 11/30/20 1103       PT LONG TERM GOAL #1   Title Pt will increase FOTO to target to demonstrate improved ability to complete functional mobility and ADL's.    Baseline 8/2: 48/64    Time 8    Period Weeks    Status New    Target Date 01/25/21      PT LONG TERM GOAL #2   Title Pt will improve 5xSTS time to < 10 sec to demonstrate clinically significant improvement in BLE strength.    Baseline 8/2: 13.8 sec    Time 8    Period Weeks    Status New    Target Date 01/25/21      PT LONG TERM GOAL #3   Title Pt will improve gait speed to at least 1.0 m/s to display evidenced norms to safely complete community wlaking tasks with decreased risk of falls.    Baseline 8/2: 0.678 m/s    Time 8    Period Weeks    Status New    Target Date 01/25/21       PT LONG TERM GOAL #4   Title Pt will improve to at least 1000' to demo ability to complete community distance walking tasks.    Baseline 8/2: Deferred due to BP readings.    Time 8    Period Weeks    Status New    Target Date 01/25/21                   Plan - 01/04/21 0809     Clinical Impression Statement Patient took his medication at start of session to keep his blood pressure in therapeutic range. His knee pain does limit his session this morning. Cueing for task orientiton required throughout session due to tangential and verbose nature of patient. Monitoring of vitals required thougohut session. Patient will benefit from skilled physical therapy improve patient's ambulation capacity, mobility, strength, and independence with IADLs/ADLS    Personal Factors and Comorbidities Time since onset of injury/illness/exacerbation;Age;Comorbidity 3+;Education;Past/Current Experience;Fitness    Comorbidities PMH includes: Paroxysmal A-fib, COPD, DM Type 2, CHF, OA of bilat knees, HTN, glaucoma of both eyes.    Examination-Activity Limitations Stairs;Squat;Stand;Locomotion Level    Examination-Participation Restrictions Community Activity    Stability/Clinical Decision Making Evolving/Moderate complexity    Rehab Potential Fair    PT Frequency 2x / week    PT Duration 8 weeks    PT Treatment/Interventions ADLs/Self Care Home Management;Cryotherapy;Gait training;Therapeutic exercise;Patient/family education;Manual techniques;Passive range of motion;Spinal Manipulations;Dry needling;Aquatic Therapy;Electrical Stimulation;Stair training;Functional mobility training;Neuromuscular re-education;Therapeutic activities;Balance training;Energy conservation;Moist Heat    PT Next Visit Plan BP, strengthening    PT Home Exercise Plan Next session    Consulted and Agree with Plan of Care Patient  Patient will benefit from skilled therapeutic intervention in order to improve  the following deficits and impairments:  Abnormal gait, Improper body mechanics, Decreased mobility, Decreased activity tolerance, Decreased balance, Decreased endurance, Decreased range of motion, Decreased strength, Difficulty walking, Decreased safety awareness, Impaired vision/preception  Visit Diagnosis: Abnormality of gait and mobility  Difficulty in walking, not elsewhere classified  Muscle weakness (generalized)     Problem List Patient Active Problem List   Diagnosis Date Noted   Bilateral primary osteoarthritis of knee 06/11/2019   Cervical radicular pain 06/11/2019   Cervical facet joint syndrome 06/11/2019   Lumbar facet arthropathy 06/11/2019   Lumbar radiculopathy 06/11/2019   Chronic pain syndrome 06/11/2019   Lung nodule 02/09/2016   Exercise hypoxemia 12/28/2015   Chronic obstructive pulmonary disease (HCC) 03/16/2015   Nuclear sclerosis 03/16/2015   OSA (obstructive sleep apnea) 03/16/2015   Primary open angle glaucoma of both eyes, severe stage 03/16/2015   Type 2 diabetes mellitus without complication, without long-term current use of insulin (HCC) 03/16/2015   Arthralgia of left lower leg 10/29/2014   Osteoarthritis of left knee 09/10/2014   Type 2 diabetes mellitus with diabetic neuropathy (HCC) 08/04/2014   Cataract, nuclear sclerotic senile 04/01/2014   Combined forms of age-related cataract 04/01/2014   Keratitis sicca, bilateral (HCC) 04/01/2014   Primary open angle glaucoma 02/27/2014   Flat feet 01/29/2014   Foot pain, bilateral 01/29/2014   Tinea pedis of both feet 01/29/2014   Ptosis of both eyelids 11/25/2013   Type 2 diabetes mellitus without retinopathy (HCC) 11/25/2013   Onychomycosis 11/19/2013   Chronic systolic heart failure (HCC) 11/11/2013   Emphysema of lung (HCC) 11/11/2013   Essential hypertension 11/11/2013   Screen for colon cancer 11/05/2013   Cardiomyopathy (HCC) 10/08/2013   Systolic HF (heart failure) (HCC) 10/08/2013    Acute systolic CHF (congestive heart failure) (HCC) 09/16/2013   COPD (chronic obstructive pulmonary disease) (HCC) 09/16/2013   HTN (hypertension) 09/16/2013   Obesity 09/16/2013   PAF (paroxysmal atrial fibrillation) (HCC) 09/16/2013   Senile nuclear sclerosis 05/22/2013   Muscle weakness (generalized) 11/11/2009   Long term current use of anticoagulant therapy 06/17/2009   Pulmonary embolism (HCC) 06/17/2009   ED (erectile dysfunction) of organic origin 05/13/2003   Precious Bard, PT, DPT  01/04/2021, 8:46 AM  [facility] MAIN [facility] SERVICES [address] [place], [place], [address] Phone: [phone]   Fax:  [phone]  Name: Lew Prout MRN: [id number] Date of Birth: 02-15-1946

## 2021-01-06 ENCOUNTER — Ambulatory Visit: Payer: Medicare Other

## 2021-01-06 ENCOUNTER — Other Ambulatory Visit: Payer: Self-pay

## 2021-01-06 DIAGNOSIS — R262 Difficulty in walking, not elsewhere classified: Secondary | ICD-10-CM

## 2021-01-06 DIAGNOSIS — R269 Unspecified abnormalities of gait and mobility: Secondary | ICD-10-CM

## 2021-01-06 DIAGNOSIS — M6281 Muscle weakness (generalized): Secondary | ICD-10-CM

## 2021-01-06 NOTE — Therapy (Signed)
[facility] MAIN [facility] SERVICES [address] [place], [place], [address] Phone: [phone]   Fax:  [phone]  Physical Therapy Treatment  Patient Details  Name: Mario [name] MRN: [id number] Date of Birth: 02-09-1946 Referring Provider (PT): [name], [name], NP   Encounter Date: 01/06/2021   PT End of Session - 01/06/21 0757     Visit Number 9    Number of Visits 17    Date for PT Re-Evaluation 01/25/21    Authorization - Visit Number 9    Authorization - Number of Visits 10    PT Start Time 0759    PT Stop Time 0844    PT Time Calculation (min) 45 min    Equipment Utilized During Treatment Gait belt    Activity Tolerance Patient tolerated treatment well;Treatment limited secondary to medical complications (Comment);Other (comment)   Elevated BP readings and O2 levels limiting eval testing.            Past Medical History:  Diagnosis Date   Arthritis    hands, knees   Atrial fibrillation (HCC)    CHF (congestive heart failure) (HCC)    COPD (chronic obstructive pulmonary disease) (HCC)    Diabetes mellitus without complication (HCC)    Diabetic neuropathy (HCC)    Heart murmur    in past, mild 1969   Hypertension    resolved after weight loss and more activity   Neuromuscular disorder (HCC)    neuropathy   Neuropathy    Pulmonary embolism (HCC)    approx 2014   Shingles '99   Sleep apnea    CPAP   Wears dentures    full upper and lower    Past Surgical History:  Procedure Laterality Date   CATARACT EXTRACTION W/PHACO Left 05/12/2019   Procedure: CATARACT EXTRACTION PHACO AND INTRAOCULAR LENS PLACEMENT (IOC) LEFTDIABETIC ISTENT INJ 2.43  00:28.6;  Surgeon: Nevada Crane, MD;  Location: [facility];  Service: Ophthalmology;  Laterality: Left;  Diabetic - oral meds sleep apnea   CATARACT EXTRACTION W/PHACO Right 06/02/2019   Procedure: CATARACT EXTRACTION PHACO AND INTRAOCULAR LENS PLACEMENT (IOC)  ISTENT INJ  RIGHT DIABETIC 1.31  00:25.4;  Surgeon: Nevada Crane, MD;  Location: [facility];  Service: Ophthalmology;  Laterality: Right;   COLONOSCOPY  08/30/2015   FINGER SURGERY Right    3'rd finger, right hand   KNEE SURGERY Right    screw in knee   WRIST SURGERY Left    metal removed    There were no vitals filed for this visit.   Subjective Assessment - 01/06/21 0805     Subjective Patient reports he did his heel lifts and steps to the side yesterday. No falls or LOB since last session. Was eager to come to PT.    Pertinent History Pt is a 75 y.o. male referred to OPPT for LE weakness, bilat join OA, and diabetic peripheral neuropathy. PMH includes: Paroxysmal A-fib, COPD, DM Type 2, CHF, OA of bilat knees, HTN, glaucoma of both eyes. Pt reports losing weight 265 lbs to 210 lbs and this is when he noticed he started having generalized weakness and knee pain. Has received steroid injections which temporarily helped (~1 year ago). Pt having difficulty in bowling for fun, walking to complete community tasks and ADL's (requires electric buggy for shopping). Reports walking ~ 12 meters. Pt does report NO falls besides slipping on lane with bowling. Has had near falls but has used walls to catch  himself. Pt reports being on 2 L/min for COPD, denies any balance impairments. Pt's goals for PT are to return to bowling, and complete community walking tasks without having to take frequent seated rest breaks.    Limitations Lifting;Standing;Walking;House hold activities    How long can you sit comfortably? unlimited    How long can you stand comfortably? 3 minutes    How long can you walk comfortably? length of hallway in gym (10-12 m)    Patient Stated Goals Return to community ambulation.    Currently in Pain? Yes    Pain Score 5     Pain Location Knee    Pain Orientation Left    Pain Descriptors / Indicators Aching    Pain Type Chronic pain    Pain Onset Yesterday    Pain  Frequency Intermittent                  BP 135/81          Treatment: Nustep Lvl 5  seat position 9; cues for keeping SPM>60; 4 minutes for cardiovascular support      Airex pad:  Standing with CGA next to support surface:  Step over orange hurdle 10x each LE; BUE support  Lateral step over 10x each LE each direction orange hurdle  Airex balance beam lateral stepping 8x with finger tip support, cues for uptight posture Airex balance beam: tandem walking 8x length of // bars. UE support    3lb ankle weight standing: -marching 12x each LE, cue for keeping feet wide; BUE support -hip extension 12x each LE, BUE support      Seated with # 3lb ankle weights  -seated heel raise 15x        Ambulate 166ft with patient pushing oxygen tank, close CGA, cue for widening BOS required; Sp02 drop to 84%; requires cues for breathing in through nose. One seated rest break due to knee pain.    Patient's vitals monitored throughout session with rest breaks provided to return Sp02 to therapeutic range.  3L of 02 via nasal cannula during exercises required.    Pt educated throughout session about proper posture and technique with exercises. Improved exercise technique, movement at target joints, use of target muscles after min to mod verbal, visual, tactile cues   Patient is highly motivated throughout physical therapy session. He requires intermittent rest breaks due to oxygen levels and fatigue. Changing of oxygen required throughout session due to issue with cording/tubing. Intermittent knee pain makes it more challenging for patient to perform standing interventions. Patient will benefit from skilled physical therapy improve patient's ambulation capacity, mobility, strength, and independence with IADLs/ADLS                     PT Education - 01/06/21 0756     Education Details exercise technique, body mechanics    Person(s) Educated Patient    Methods  Explanation;Demonstration;Tactile cues;Verbal cues    Comprehension Verbalized understanding;Returned demonstration;Verbal cues required;Tactile cues required              PT Short Term Goals - 11/30/20 1101       PT SHORT TERM GOAL #1   Title Pt will be independent with HEP to improve strength, balance, endurance to improve community ADL's.    Baseline 8/2: Initiate next session    Time 4    Period Weeks    Status New    Target Date 12/28/20  PT Long Term Goals - 11/30/20 1103       PT LONG TERM GOAL #1   Title Pt will increase FOTO to target to demonstrate improved ability to complete functional mobility and ADL's.    Baseline 8/2: 48/64    Time 8    Period Weeks    Status New    Target Date 01/25/21      PT LONG TERM GOAL #2   Title Pt will improve 5xSTS time to < 10 sec to demonstrate clinically significant improvement in BLE strength.    Baseline 8/2: 13.8 sec    Time 8    Period Weeks    Status New    Target Date 01/25/21      PT LONG TERM GOAL #3   Title Pt will improve gait speed to at least 1.0 m/s to display evidenced norms to safely complete community wlaking tasks with decreased risk of falls.    Baseline 8/2: 0.678 m/s    Time 8    Period Weeks    Status New    Target Date 01/25/21      PT LONG TERM GOAL #4   Title Pt will improve to at least 1000' to demo ability to complete community distance walking tasks.    Baseline 8/2: Deferred due to BP readings.    Time 8    Period Weeks    Status New    Target Date 01/25/21                   Plan - 01/06/21 0834     Clinical Impression Statement Patient is highly motivated throughout physical therapy session. He requires intermittent rest breaks due to oxygen levels and fatigue. Changing of oxygen required throughout session due to issue with cording/tubing. Intermittent knee pain makes it more challenging for patient to perform standing interventions. Patient will  benefit from skilled physical therapy improve patient's ambulation capacity, mobility, strength, and independence with IADLs/ADLS    Personal Factors and Comorbidities Time since onset of injury/illness/exacerbation;Age;Comorbidity 3+;Education;Past/Current Experience;Fitness    Comorbidities PMH includes: Paroxysmal A-fib, COPD, DM Type 2, CHF, OA of bilat knees, HTN, glaucoma of both eyes.    Examination-Activity Limitations Stairs;Squat;Stand;Locomotion Level    Examination-Participation Restrictions Community Activity    Stability/Clinical Decision Making Evolving/Moderate complexity    Rehab Potential Fair    PT Frequency 2x / week    PT Duration 8 weeks    PT Treatment/Interventions ADLs/Self Care Home Management;Cryotherapy;Gait training;Therapeutic exercise;Patient/family education;Manual techniques;Passive range of motion;Spinal Manipulations;Dry needling;Aquatic Therapy;Electrical Stimulation;Stair training;Functional mobility training;Neuromuscular re-education;Therapeutic activities;Balance training;Energy conservation;Moist Heat    PT Next Visit Plan BP, strengthening    PT Home Exercise Plan Next session    Consulted and Agree with Plan of Care Patient             Patient will benefit from skilled therapeutic intervention in order to improve the following deficits and impairments:  Abnormal gait, Improper body mechanics, Decreased mobility, Decreased activity tolerance, Decreased balance, Decreased endurance, Decreased range of motion, Decreased strength, Difficulty walking, Decreased safety awareness, Impaired vision/preception  Visit Diagnosis: Abnormality of gait and mobility  Difficulty in walking, not elsewhere classified  Muscle weakness (generalized)     Problem List Patient Active Problem List   Diagnosis Date Noted   Bilateral primary osteoarthritis of knee 06/11/2019   Cervical radicular pain 06/11/2019   Cervical facet joint syndrome 06/11/2019   Lumbar  facet arthropathy 06/11/2019   Lumbar radiculopathy 06/11/2019  Chronic pain syndrome 06/11/2019   Lung nodule 02/09/2016   Exercise hypoxemia 12/28/2015   Chronic obstructive pulmonary disease (HCC) 03/16/2015   Nuclear sclerosis 03/16/2015   OSA (obstructive sleep apnea) 03/16/2015   Primary open angle glaucoma of both eyes, severe stage 03/16/2015   Type 2 diabetes mellitus without complication, without long-term current use of insulin (HCC) 03/16/2015   Arthralgia of left lower leg 10/29/2014   Osteoarthritis of left knee 09/10/2014   Type 2 diabetes mellitus with diabetic neuropathy (HCC) 08/04/2014   Cataract, nuclear sclerotic senile 04/01/2014   Combined forms of age-related cataract 04/01/2014   Keratitis sicca, bilateral (HCC) 04/01/2014   Primary open angle glaucoma 02/27/2014   Flat feet 01/29/2014   Foot pain, bilateral 01/29/2014   Tinea pedis of both feet 01/29/2014   Ptosis of both eyelids 11/25/2013   Type 2 diabetes mellitus without retinopathy (HCC) 11/25/2013   Onychomycosis 11/19/2013   Chronic systolic heart failure (HCC) 11/11/2013   Emphysema of lung (HCC) 11/11/2013   Essential hypertension 11/11/2013   Screen for colon cancer 11/05/2013   Cardiomyopathy (HCC) 10/08/2013   Systolic HF (heart failure) (HCC) 10/08/2013   Acute systolic CHF (congestive heart failure) (HCC) 09/16/2013   COPD (chronic obstructive pulmonary disease) (HCC) 09/16/2013   HTN (hypertension) 09/16/2013   Obesity 09/16/2013   PAF (paroxysmal atrial fibrillation) (HCC) 09/16/2013   Senile nuclear sclerosis 05/22/2013   Muscle weakness (generalized) 11/11/2009   Long term current use of anticoagulant therapy 06/17/2009   Pulmonary embolism (HCC) 06/17/2009   ED (erectile dysfunction) of organic origin 05/13/2003    Precious BardMarina Joal Eakle, PT, DPT  01/06/2021, 8:44 AM  [facility] [address] [place], [place], [address] Phone:  [phone]   Fax:  [phone]  Name: Mario [name] MRN: [id number] Date of Birth: 1945/11/07

## 2021-01-11 ENCOUNTER — Other Ambulatory Visit: Payer: Self-pay

## 2021-01-11 ENCOUNTER — Ambulatory Visit: Payer: Medicare Other

## 2021-01-11 DIAGNOSIS — R269 Unspecified abnormalities of gait and mobility: Secondary | ICD-10-CM

## 2021-01-11 DIAGNOSIS — R262 Difficulty in walking, not elsewhere classified: Secondary | ICD-10-CM

## 2021-01-11 DIAGNOSIS — M6281 Muscle weakness (generalized): Secondary | ICD-10-CM

## 2021-01-11 NOTE — Therapy (Signed)
[facility] MAIN [facility] SERVICES [address] [place], [place], [address] Phone: [phone]   Fax:  [phone]  Physical Therapy Treatment/ Physical Therapy Progress Note   Dates of reporting period  11/30/20   to   01/11/21   Patient Details  Name: Mario [name] MRN: [id number] Date of Birth: 03-31-1946 Referring Provider (PT): [name], [name], NP   Encounter Date: 01/11/2021    Past Medical History:  Diagnosis Date   Arthritis    hands, knees   Atrial fibrillation (HCC)    CHF (congestive heart failure) (HCC)    COPD (chronic obstructive pulmonary disease) (HCC)    Diabetes mellitus without complication (HCC)    Diabetic neuropathy (HCC)    Heart murmur    in past, mild 1969   Hypertension    resolved after weight loss and more activity   Neuromuscular disorder (HCC)    neuropathy   Neuropathy    Pulmonary embolism (HCC)    approx 2014   Shingles '99   Sleep apnea    CPAP   Wears dentures    full upper and lower    Past Surgical History:  Procedure Laterality Date   CATARACT EXTRACTION W/PHACO Left 05/12/2019   Procedure: CATARACT EXTRACTION PHACO AND INTRAOCULAR LENS PLACEMENT (IOC) LEFTDIABETIC ISTENT INJ 2.43  00:28.6;  Surgeon: Nevada Crane, MD;  Location: [facility];  Service: Ophthalmology;  Laterality: Left;  Diabetic - oral meds sleep apnea   CATARACT EXTRACTION W/PHACO Right 06/02/2019   Procedure: CATARACT EXTRACTION PHACO AND INTRAOCULAR LENS PLACEMENT (IOC) ISTENT INJ  RIGHT DIABETIC 1.31  00:25.4;  Surgeon: Nevada Crane, MD;  Location: [facility];  Service: Ophthalmology;  Laterality: Right;   COLONOSCOPY  08/30/2015   FINGER SURGERY Right    3'rd finger, right hand   KNEE SURGERY Right    screw in knee   WRIST SURGERY Left    metal removed    There were no vitals filed for this visit.     BP at start of session: 156/88  3L of oxygen via nasal cannula throughout  session with vitals monitored throughout session.   Goals:  HEP compliance FOTO: 57%  5xSTS: 12.41 seconds 10 MWT: first test: 14.4seconds second test 14.2 seconds=0.7 m/s  6 MWT: 120 ft 1 min 20 seconds   Treatment:  Precor leg press bilateral LE"s #55, requires mod A for placement of feet, cues for keeping knees from hyperextension 12x ; 2 sets  Seated with # 4 ankle weights  -Seated marches with upright posture, back away from back of chair for abdominal/trunk activation/stabilization, 12x each LE -Seated LAQ with 3 second holds, 12x each LE, cueing for muscle activation and sequencing for neutral alignment -Seated IR/ER with cueing for stabilizing knee placement with lateral foot movement for optimal muscle recruitment, 10x each LE -seated heel raise 15x with cue for slow controlled movements   Pt educated throughout session about proper posture and technique with exercises. Improved exercise technique, movement at target joints, use of target muscles after min to mod verbal, visual, tactile cues.     Patient's condition has the potential to improve in response to therapy. Maximum improvement is yet to be obtained. The anticipated improvement is attainable and reasonable in a generally predictable time.  Patient reports he is limited by mobility and pain in his knees. Reports he can't bend down due to his knees and wants to be able to do that  Patient demonstrates excellent progress towards functional goals. His speed of ambulation as well as capacity for functional ambulation is improving. Patient continues to want to progress his ability to bend down as well as strengthen legs. atient's condition has the potential to improve in response to therapy. Maximum improvement is yet to be obtained. The anticipated improvement is attainable and reasonable in a generally predictable time. Patient will benefit from skilled physical therapy improve patient's ambulation capacity, mobility,  strength, and independence with IADLs/ADLS                  PT Short Term Goals - 11/30/20 1101       PT SHORT TERM GOAL #1   Title Pt will be independent with HEP to improve strength, balance, endurance to improve community ADL's.    Baseline 8/2: Initiate next session    Time 4    Period Weeks    Status New    Target Date 12/28/20               PT Long Term Goals - 11/30/20 1103       PT LONG TERM GOAL #1   Title Pt will increase FOTO to target to demonstrate improved ability to complete functional mobility and ADL's.    Baseline 8/2: 48/64    Time 8    Period Weeks    Status New    Target Date 01/25/21      PT LONG TERM GOAL #2   Title Pt will improve 5xSTS time to < 10 sec to demonstrate clinically significant improvement in BLE strength.    Baseline 8/2: 13.8 sec    Time 8    Period Weeks    Status New    Target Date 01/25/21      PT LONG TERM GOAL #3   Title Pt will improve gait speed to at least 1.0 m/s to display evidenced norms to safely complete community wlaking tasks with decreased risk of falls.    Baseline 8/2: 0.678 m/s    Time 8    Period Weeks    Status New    Target Date 01/25/21      PT LONG TERM GOAL #4   Title Pt will improve to at least 1000' to demo ability to complete community distance walking tasks.    Baseline 8/2: Deferred due to BP readings.    Time 8    Period Weeks    Status New    Target Date 01/25/21                    Patient will benefit from skilled therapeutic intervention in order to improve the following deficits and impairments:     Visit Diagnosis: No diagnosis found.     Problem List Patient Active Problem List   Diagnosis Date Noted   Bilateral primary osteoarthritis of knee 06/11/2019   Cervical radicular pain 06/11/2019   Cervical facet joint syndrome 06/11/2019   Lumbar facet arthropathy 06/11/2019   Lumbar radiculopathy 06/11/2019   Chronic pain syndrome 06/11/2019    Lung nodule 02/09/2016   Exercise hypoxemia 12/28/2015   Chronic obstructive pulmonary disease (HCC) 03/16/2015   Nuclear sclerosis 03/16/2015   OSA (obstructive sleep apnea) 03/16/2015   Primary open angle glaucoma of both eyes, severe stage 03/16/2015   Type 2 diabetes mellitus without complication, without long-term current use of insulin (HCC) 03/16/2015   Arthralgia of left lower leg 10/29/2014   Osteoarthritis of left knee 09/10/2014  Type 2 diabetes mellitus with diabetic neuropathy (HCC) 08/04/2014   Cataract, nuclear sclerotic senile 04/01/2014   Combined forms of age-related cataract 04/01/2014   Keratitis sicca, bilateral (HCC) 04/01/2014   Primary open angle glaucoma 02/27/2014   Flat feet 01/29/2014   Foot pain, bilateral 01/29/2014   Tinea pedis of both feet 01/29/2014   Ptosis of both eyelids 11/25/2013   Type 2 diabetes mellitus without retinopathy (HCC) 11/25/2013   Onychomycosis 11/19/2013   Chronic systolic heart failure (HCC) 11/11/2013   Emphysema of lung (HCC) 11/11/2013   Essential hypertension 11/11/2013   Screen for colon cancer 11/05/2013   Cardiomyopathy (HCC) 10/08/2013   Systolic HF (heart failure) (HCC) 10/08/2013   Acute systolic CHF (congestive heart failure) (HCC) 09/16/2013   COPD (chronic obstructive pulmonary disease) (HCC) 09/16/2013   HTN (hypertension) 09/16/2013   Obesity 09/16/2013   PAF (paroxysmal atrial fibrillation) (HCC) 09/16/2013   Senile nuclear sclerosis 05/22/2013   Muscle weakness (generalized) 11/11/2009   Long term current use of anticoagulant therapy 06/17/2009   Pulmonary embolism (HCC) 06/17/2009   ED (erectile dysfunction) of organic origin 05/13/2003    Precious Bard, PT, DPT  01/11/2021, 7:57 AM  [facility] MAIN [facility] SERVICES [address] [place], [place], [address] Phone: [phone]   Fax:  [phone]  Name: Mario [name] MRN: [id number] Date of Birth:  1946-03-28

## 2021-01-13 ENCOUNTER — Other Ambulatory Visit: Payer: Self-pay

## 2021-01-13 ENCOUNTER — Ambulatory Visit: Payer: Medicare Other | Admitting: Physical Therapy

## 2021-01-13 DIAGNOSIS — M6281 Muscle weakness (generalized): Secondary | ICD-10-CM

## 2021-01-13 DIAGNOSIS — R269 Unspecified abnormalities of gait and mobility: Secondary | ICD-10-CM

## 2021-01-13 DIAGNOSIS — R262 Difficulty in walking, not elsewhere classified: Secondary | ICD-10-CM

## 2021-01-13 NOTE — Therapy (Signed)
[place] MAIN [facility] SERVICES [address] [place], [place], [address] Phone: [phone]   Fax:  [phone]  Physical Therapy Treatment  Patient Details  Name: Lakota Markgraf MRN: [id number] Date of Birth: 12-Jun-1945 Referring Provider (PT): [name], Victoriano Lain, NP   Encounter Date: 01/13/2021   PT End of Session - 01/13/21 0912     Visit Number 11    Number of Visits 17    Date for PT Re-Evaluation 01/25/21    Authorization Type next session 1/10 PN 9/13    PT Start Time 0805    PT Stop Time 0850    PT Time Calculation (min) 45 min    Equipment Utilized During Treatment Gait belt    Activity Tolerance Patient tolerated treatment well;Treatment limited secondary to medical complications (Comment);Other (comment)   Elevated BP readings            Past Medical History:  Diagnosis Date   Arthritis    hands, knees   Atrial fibrillation (HCC)    CHF (congestive heart failure) (HCC)    COPD (chronic obstructive pulmonary disease) (HCC)    Diabetes mellitus without complication (HCC)    Diabetic neuropathy (HCC)    Heart murmur    in past, mild 1969   Hypertension    resolved after weight loss and more activity   Neuromuscular disorder ([place])    neuropathy   Neuropathy    Pulmonary embolism ([place])    approx 2014   Shingles '99   Sleep apnea    CPAP   Wears dentures    full upper and lower    Past Surgical History:  Procedure Laterality Date   CATARACT EXTRACTION W/PHACO Left 05/12/2019   Procedure: CATARACT EXTRACTION PHACO AND INTRAOCULAR LENS PLACEMENT ([place]) LEFTDIABETIC ISTENT INJ 2.43  00:28.6;  Surgeon: Eulogio Bear, MD;  Location: [place];  Service: Ophthalmology;  Laterality: Left;  Diabetic - oral meds sleep apnea   CATARACT EXTRACTION W/PHACO Right 06/02/2019   Procedure: CATARACT EXTRACTION PHACO AND INTRAOCULAR LENS PLACEMENT ([facility]) ISTENT INJ  RIGHT DIABETIC 1.31  00:25.4;  Surgeon: Eulogio Bear, MD;  Location: [place];  Service: Ophthalmology;  Laterality: Right;   COLONOSCOPY  08/30/2015   FINGER SURGERY Right    3'rd finger, right hand   KNEE SURGERY Right    screw in knee   WRIST SURGERY Left    metal removed    There were no vitals filed for this visit.   Subjective Assessment - 01/13/21 0909     Subjective Patient reports no falls or LOB, he played video games yesterday. He states he forgot to take his meds this morning, including BP meds (PT encouraged pt to take ASAP). He is going to the grocery store right after this session and has been partially compliant with HEP performing some of the exercises.    Pertinent History Pt is a 75 y.o. male referred to OPPT for LE weakness, bilat join OA, and diabetic peripheral neuropathy. PMH includes: Paroxysmal A-fib, COPD, DM Type 2, CHF, OA of bilat knees, HTN, glaucoma of both eyes. Pt reports losing weight 265 lbs to 210 lbs and this is when he noticed he started having generalized weakness and knee pain. Has received steroid injections which temporarily helped (~1 year ago). Pt having difficulty in bowling for fun, walking to complete community tasks and ADL's (requires electric buggy for shopping). Reports walking ~ 12 meters. Pt does report NO falls besides slipping  on lane with bowling. Has had near falls but has used walls to catch himself. Pt reports being on 2 L/min for COPD, denies any balance impairments. Pt's goals for PT are to return to bowling, and complete community walking tasks without having to take frequent seated rest breaks.    Limitations Lifting;Standing;Walking;House hold activities    How long can you sit comfortably? unlimited    How long can you stand comfortably? 3 minutes    How long can you walk comfortably? length of hallway in gym (10-12 m)    Patient Stated Goals Return to community ambulation.    Currently in Pain? Yes    Pain Score 7     Pain Location Knee    Pain Orientation Left     Pain Descriptors / Indicators Aching    Pain Type Chronic pain    Pain Onset Yesterday            BP at start of session: 164/109 BP at end of session: 183/102   3L of oxygen via nasal cannula throughout session with vitals monitored throughout session.  SpO2 ranged between 93-100%.    Treatment:  Precor leg press bilateral LE"s #55, requires min A for placement of feet, cues for keeping knees from hyperextension 12x ; 2 sets   Seated with # 4 ankle weights  -Seated marches with upright posture, back away from back of chair for abdominal/trunk activation/stabilization, 12x each LE -Seated LAQ with 3 second holds, 12x each LE, cueing for muscle activation and sequencing for neutral alignment -Seated IR/ER with cueing for stabilizing knee placement with lateral foot movement for optimal muscle recruitment, 10x each LE -seated heel raise 15x with cue for slow controlled movements    Airex pad:  Alternating standing marches x 15 each LE Step onto and off pad x10 each LE  Standing with CGA next to support surface:  Step over orange hurdle 10x each LE; single UE support  Lateral step over 10x each LE each direction orange hurdle  Ambulate 113f with patient pushing oxygen tank, close CGA, cue for posture. No seated rest break.   Pt educated throughout session about proper posture and technique with exercises. Improved exercise technique, movement at target joints, use of target muscles after min to mod verbal, visual, tactile cues.       Patient pleasant and demonstrates excellent motivation throughout physical therapy session. BP elevated throughout session with patient admitting he forgot to take his meds this morning; symptoms monitored and additional rest breaks allotted for BP and muscular fatigue. He demo good carryover of previous cueing as minimal reminders were required. He was able to walk increased distance without rest breaks compared to previous session. Patient will  benefit from skilled physical therapy improve patient's ambulation capacity, mobility, strength, and independence with IADLs/ADLS.      PT Short Term Goals - 01/11/21 0818       PT SHORT TERM GOAL #1   Title Pt will be independent with HEP to improve strength, balance, endurance to improve community ADL's.    Baseline 8/2: Initiate next session 9/13: intermittent compliance    Time 4    Period Weeks    Status Partially Met    Target Date 12/28/20               PT Long Term Goals - 01/11/21 0816       PT LONG TERM GOAL #1   Title Pt will increase FOTO to target to demonstrate improved  ability to complete functional mobility and ADL's.    Baseline 8/2: 48/64 9/13: 57%    Time 8    Period Weeks    Status Partially Met    Target Date 01/25/21      PT LONG TERM GOAL #2   Title Pt will improve 5xSTS time to < 10 sec to demonstrate clinically significant improvement in BLE strength.    Baseline 8/2: 13.8 sec 9/13: 12.41 seconds    Time 8    Period Weeks    Status Partially Met    Target Date 01/25/21      PT LONG TERM GOAL #3   Title Pt will improve gait speed to at least 1.0 m/s to display evidenced norms to safely complete community wlaking tasks with decreased risk of falls.    Baseline 8/2: 0.678 m/s 9/13: 0.7 m/s    Time 8    Period Weeks    Status Partially Met    Target Date 01/25/21      PT LONG TERM GOAL #4   Title Pt will improve 6MWT to at least 1000' to demo ability to complete community distance walking tasks.    Baseline 8/2: Deferred due to BP readings. 8/4: 64 ft 9/13: 120 ft in 1 min 20 seconds    Time 8    Period Weeks    Status Partially Met    Target Date 01/25/21                   Plan - 01/13/21 0913     Clinical Impression Statement Patient pleasant and demonstrates excellent motivation throughout physical therapy session. BP elevated throughout session with patient admitting he forgot to take his meds this morning; symptoms  monitored and additional rest breaks allotted for BP and muscular fatigue. He demo good carryover of previous cueing as minimal reminders were required. He was able to walk increased distance without rest breaks compared to previous session. Patient will benefit from skilled physical therapy improve patient's ambulation capacity, mobility, strength, and independence with IADLs/ADLS.    Personal Factors and Comorbidities Time since onset of injury/illness/exacerbation;Age;Comorbidity 3+;Education;Past/Current Experience;Fitness    Comorbidities PMH includes: Paroxysmal A-fib, COPD, DM Type 2, CHF, OA of bilat knees, HTN, glaucoma of both eyes.    Examination-Activity Limitations Stairs;Squat;Stand;Locomotion Level    Examination-Participation Restrictions Community Activity    Stability/Clinical Decision Making Evolving/Moderate complexity    Rehab Potential Fair    PT Frequency 2x / week    PT Duration 8 weeks    PT Treatment/Interventions ADLs/Self Care Home Management;Cryotherapy;Gait training;Therapeutic exercise;Patient/family education;Manual techniques;Passive range of motion;Spinal Manipulations;Dry needling;Aquatic Therapy;Electrical Stimulation;Stair training;Functional mobility training;Neuromuscular re-education;Therapeutic activities;Balance training;Energy conservation;Moist Heat    PT Next Visit Plan BP, strengthening    PT Home Exercise Plan Next session    Consulted and Agree with Plan of Care Patient             Patient will benefit from skilled therapeutic intervention in order to improve the following deficits and impairments:  Abnormal gait, Improper body mechanics, Decreased mobility, Decreased activity tolerance, Decreased balance, Decreased endurance, Decreased range of motion, Decreased strength, Difficulty walking, Decreased safety awareness, Impaired vision/preception  Visit Diagnosis: Abnormality of gait and mobility  Muscle weakness (generalized)  Difficulty in  walking, not elsewhere classified     Problem List Patient Active Problem List   Diagnosis Date Noted   Bilateral primary osteoarthritis of knee 06/11/2019   Cervical radicular pain 06/11/2019   Cervical facet joint syndrome 06/11/2019  Lumbar facet arthropathy 06/11/2019   Lumbar radiculopathy 06/11/2019   Chronic pain syndrome 06/11/2019   Lung nodule 02/09/2016   Exercise hypoxemia 12/28/2015   Chronic obstructive pulmonary disease ([place]) 03/16/2015   Nuclear sclerosis 03/16/2015   OSA (obstructive sleep apnea) 03/16/2015   Primary open angle glaucoma of both eyes, severe stage 03/16/2015   Type 2 diabetes mellitus without complication, without long-term current use of insulin ([place]) 03/16/2015   Arthralgia of left lower leg 10/29/2014   Osteoarthritis of left knee 09/10/2014   Type 2 diabetes mellitus with diabetic neuropathy ([place]) 08/04/2014   Cataract, nuclear sclerotic senile 04/01/2014   Combined forms of age-related cataract 04/01/2014   Keratitis sicca, bilateral ([place]) 04/01/2014   Primary open angle glaucoma 02/27/2014   Flat feet 01/29/2014   Foot pain, bilateral 01/29/2014   Tinea pedis of both feet 01/29/2014   Ptosis of both eyelids 11/25/2013   Type 2 diabetes mellitus without retinopathy ([place]) 11/25/2013   Onychomycosis 47/12/2955   Chronic systolic heart failure ([place]) 11/11/2013   Emphysema of lung ([place]) 11/11/2013   Essential hypertension 11/11/2013   Screen for colon cancer 11/05/2013   Cardiomyopathy ([place]) 47/34/0370   Systolic HF (heart failure) ([place]) 96/43/8381   Acute systolic CHF (congestive heart failure) ([place]) 09/16/2013   COPD (chronic obstructive pulmonary disease) ([place]) 09/16/2013   HTN (hypertension) 09/16/2013   Obesity 09/16/2013   PAF (paroxysmal atrial fibrillation) ([place]) 09/16/2013   Senile nuclear sclerosis 05/22/2013   Muscle weakness (generalized) 11/11/2009   Long term current use of anticoagulant therapy 06/17/2009   Pulmonary  embolism ([place]) 06/17/2009   ED (erectile dysfunction) of organic origin 05/13/2003    Patrina Levering PT, DPT  Ramonita Lab, PT 01/13/2021, 9:15 AM  [place] MAIN [facility] SERVICES [address] [place], [place], [address] Phone: [phone]   Fax:  [phone]  Name: Evie Crumpler MRN: [id number] Date of Birth: 20-Mar-1946

## 2021-01-18 ENCOUNTER — Ambulatory Visit: Payer: Medicare Other

## 2021-01-18 ENCOUNTER — Other Ambulatory Visit: Payer: Self-pay

## 2021-01-18 DIAGNOSIS — R269 Unspecified abnormalities of gait and mobility: Secondary | ICD-10-CM | POA: Diagnosis not present

## 2021-01-18 DIAGNOSIS — R262 Difficulty in walking, not elsewhere classified: Secondary | ICD-10-CM

## 2021-01-18 DIAGNOSIS — M6281 Muscle weakness (generalized): Secondary | ICD-10-CM

## 2021-01-18 NOTE — Therapy (Signed)
[place] MAIN [facility] SERVICES [address] [place], [place], [address] Phone: [phone]   Fax:  [phone]  Physical Therapy Treatment  Patient Details  Name: Mario [name] MRN: [id number] Date of Birth: 09/16/1945 Referring Provider (PT): [name], Victoriano Lain, NP   Encounter Date: 01/18/2021   PT End of Session - 01/18/21 0755     Visit Number 12    Number of Visits 17    Date for PT Re-Evaluation 01/25/21    Authorization Type next session 2/10 PN 9/13    PT Start Time 0759    PT Stop Time 0844    PT Time Calculation (min) 45 min    Equipment Utilized During Treatment Gait belt    Activity Tolerance Patient tolerated treatment well;Treatment limited secondary to medical complications (Comment);Other (comment)   Elevated BP readings            Past Medical History:  Diagnosis Date   Arthritis    hands, knees   Atrial fibrillation (HCC)    CHF (congestive heart failure) (HCC)    COPD (chronic obstructive pulmonary disease) (HCC)    Diabetes mellitus without complication (HCC)    Diabetic neuropathy (HCC)    Heart murmur    in past, mild 1969   Hypertension    resolved after weight loss and more activity   Neuromuscular disorder ([place])    neuropathy   Neuropathy    Pulmonary embolism ([place])    approx 2014   Shingles '99   Sleep apnea    CPAP   Wears dentures    full upper and lower    Past Surgical History:  Procedure Laterality Date   CATARACT EXTRACTION W/PHACO Left 05/12/2019   Procedure: CATARACT EXTRACTION PHACO AND INTRAOCULAR LENS PLACEMENT ([place]) LEFTDIABETIC ISTENT INJ 2.43  00:28.6;  Surgeon: Eulogio Bear, MD;  Location: [place];  Service: Ophthalmology;  Laterality: Left;  Diabetic - oral meds sleep apnea   CATARACT EXTRACTION W/PHACO Right 06/02/2019   Procedure: CATARACT EXTRACTION PHACO AND INTRAOCULAR LENS PLACEMENT (IOC) ISTENT INJ  RIGHT DIABETIC 1.31  00:25.4;  Surgeon: Eulogio Bear, MD;  Location: [place];  Service: Ophthalmology;  Laterality: Right;   COLONOSCOPY  08/30/2015   FINGER SURGERY Right    3'rd finger, right hand   KNEE SURGERY Right    screw in knee   WRIST SURGERY Left    metal removed    There were no vitals filed for this visit.   Subjective Assessment - 01/18/21 0803     Subjective Patient reports no falls or LOB since last session. Is happy his football team won. Took his blood pressure medication prior to PT session.    Pertinent History Pt is a 75 y.o. male referred to OPPT for LE weakness, bilat join OA, and diabetic peripheral neuropathy. PMH includes: Paroxysmal A-fib, COPD, DM Type 2, CHF, OA of bilat knees, HTN, glaucoma of both eyes. Pt reports losing weight 265 lbs to 210 lbs and this is when he noticed he started having generalized weakness and knee pain. Has received steroid injections which temporarily helped (~1 year ago). Pt having difficulty in bowling for fun, walking to complete community tasks and ADL's (requires electric buggy for shopping). Reports walking ~ 12 meters. Pt does report NO falls besides slipping on lane with bowling. Has had near falls but has used walls to catch himself. Pt reports being on 2 L/min for COPD, denies any balance impairments. Pt's goals  for PT are to return to bowling, and complete community walking tasks without having to take frequent seated rest breaks.    Limitations Lifting;Standing;Walking;House hold activities    How long can you sit comfortably? unlimited    How long can you stand comfortably? 3 minutes    How long can you walk comfortably? length of hallway in gym (10-12 m)    Patient Stated Goals Return to community ambulation.    Currently in Pain? Yes    Pain Score 5     Pain Location Knee    Pain Orientation Left    Pain Descriptors / Indicators Aching    Pain Type Chronic pain    Pain Onset Yesterday    Pain Frequency Intermittent              BP at start of  session: 168/95   BP after new step: 198/96   BP at end of session: 191/98   3L of oxygen via nasal cannula throughout session with vitals monitored throughout session.  SpO2 ranged between 93-100%.    Treatment:  Nustep Lvl 5  seat position 9; cues for keeping SPM>60; 4 minutes for cardiovascular support    Precor leg press bilateral LE"s #55, requires min A for placement of feet, cues for keeping knees from hyperextension 12x ; 2 sets  Precore leg press: single LE: #25 each LE; 12x each LE, cue for keeping knee from hyperextending. Cue for reduced velocity for improved muscle activation sequencing.   Standing in // bars:  4lb ankle weights: -lateral stepping 6x length of // bars; cues for foot clearance. One near LOB -high knee march / backwards walking 6x length of // bars    Seated with # 4 ankle weights  -Seated marches with upright posture, back away from back of chair for abdominal/trunk activation/stabilization, 12x each LE -Seated LAQ with 3 second holds, 12x each LE, cueing for muscle activation and sequencing for neutral alignment -Seated IR/ER with cueing for stabilizing knee placement with lateral foot movement for optimal muscle recruitment, 12x each LE -seated heel raise 15x with cue for slow controlled movements        Pt educated throughout session about proper posture and technique with exercises. Improved exercise technique, movement at target joints, use of target muscles after min to mod verbal, visual, tactile cues.   Patient presents initially with high blood pressure requiring monitoring of vitals throughout physical therapy session. He is highly motivated throughout physical therapy session. Education on monitoring blood pressure and vitals at home performed with patient verbalizing understanding, reports he took his BP meds with coffee which may be why it is higher than normal. He demo good carryover of previous cueing as minimal reminders were required.  Patient will benefit from skilled physical therapy improve patient's ambulation capacity, mobility, strength, and independence with IADLs/ADLS.                     PT Education - 01/18/21 0755     Education Details exercise technique, body mechanics    Person(s) Educated Patient    Methods Explanation;Demonstration;Tactile cues;Verbal cues    Comprehension Verbalized understanding;Returned demonstration;Verbal cues required;Tactile cues required              PT Short Term Goals - 01/11/21 0818       PT SHORT TERM GOAL #1   Title Pt will be independent with HEP to improve strength, balance, endurance to improve community ADL's.    Baseline  8/2: Initiate next session 9/13: intermittent compliance    Time 4    Period Weeks    Status Partially Met    Target Date 12/28/20               PT Long Term Goals - 01/11/21 0816       PT LONG TERM GOAL #1   Title Pt will increase FOTO to target to demonstrate improved ability to complete functional mobility and ADL's.    Baseline 8/2: 48/64 9/13: 57%    Time 8    Period Weeks    Status Partially Met    Target Date 01/25/21      PT LONG TERM GOAL #2   Title Pt will improve 5xSTS time to < 10 sec to demonstrate clinically significant improvement in BLE strength.    Baseline 8/2: 13.8 sec 9/13: 12.41 seconds    Time 8    Period Weeks    Status Partially Met    Target Date 01/25/21      PT LONG TERM GOAL #3   Title Pt will improve gait speed to at least 1.0 m/s to display evidenced norms to safely complete community wlaking tasks with decreased risk of falls.    Baseline 8/2: 0.678 m/s 9/13: 0.7 m/s    Time 8    Period Weeks    Status Partially Met    Target Date 01/25/21      PT LONG TERM GOAL #4   Title Pt will improve 6MWT to at least 1000' to demo ability to complete community distance walking tasks.    Baseline 8/2: Deferred due to BP readings. 8/4: 64 ft 9/13: 120 ft in 1 min 20 seconds    Time 8     Period Weeks    Status Partially Met    Target Date 01/25/21                   Plan - 01/18/21 0820     Clinical Impression Statement Patient presents initially with high blood pressure requiring monitoring of vitals throughout physical therapy session. He is highly motivated throughout physical therapy session. Education on monitoring blood pressure and vitals at home performed with patient verbalizing understanding, reports he took his BP meds with coffee which may be why it is higher than normal. Agreeable to call his cardiologist after session today. He demo good carryover of previous cueing as minimal reminders were required. Patient will benefit from skilled physical therapy improve patient's ambulation capacity, mobility, strength, and independence with IADLs/ADLS.    Personal Factors and Comorbidities Time since onset of injury/illness/exacerbation;Age;Comorbidity 3+;Education;Past/Current Experience;Fitness    Comorbidities PMH includes: Paroxysmal A-fib, COPD, DM Type 2, CHF, OA of bilat knees, HTN, glaucoma of both eyes.    Examination-Activity Limitations Stairs;Squat;Stand;Locomotion Level    Examination-Participation Restrictions Community Activity    Stability/Clinical Decision Making Evolving/Moderate complexity    Rehab Potential Fair    PT Frequency 2x / week    PT Duration 8 weeks    PT Treatment/Interventions ADLs/Self Care Home Management;Cryotherapy;Gait training;Therapeutic exercise;Patient/family education;Manual techniques;Passive range of motion;Spinal Manipulations;Dry needling;Aquatic Therapy;Electrical Stimulation;Stair training;Functional mobility training;Neuromuscular re-education;Therapeutic activities;Balance training;Energy conservation;Moist Heat    PT Next Visit Plan BP, strengthening    PT Home Exercise Plan Next session    Consulted and Agree with Plan of Care Patient             Patient will benefit from skilled therapeutic intervention  in order to improve the following deficits and  impairments:  Abnormal gait, Improper body mechanics, Decreased mobility, Decreased activity tolerance, Decreased balance, Decreased endurance, Decreased range of motion, Decreased strength, Difficulty walking, Decreased safety awareness, Impaired vision/preception  Visit Diagnosis: Abnormality of gait and mobility  Muscle weakness (generalized)  Difficulty in walking, not elsewhere classified     Problem List Patient Active Problem List   Diagnosis Date Noted   Bilateral primary osteoarthritis of knee 06/11/2019   Cervical radicular pain 06/11/2019   Cervical facet joint syndrome 06/11/2019   Lumbar facet arthropathy 06/11/2019   Lumbar radiculopathy 06/11/2019   Chronic pain syndrome 06/11/2019   Lung nodule 02/09/2016   Exercise hypoxemia 12/28/2015   Chronic obstructive pulmonary disease ([place]) 03/16/2015   Nuclear sclerosis 03/16/2015   OSA (obstructive sleep apnea) 03/16/2015   Primary open angle glaucoma of both eyes, severe stage 03/16/2015   Type 2 diabetes mellitus without complication, without long-term current use of insulin ([place]) 03/16/2015   Arthralgia of left lower leg 10/29/2014   Osteoarthritis of left knee 09/10/2014   Type 2 diabetes mellitus with diabetic neuropathy ([place]) 08/04/2014   Cataract, nuclear sclerotic senile 04/01/2014   Combined forms of age-related cataract 04/01/2014   Keratitis sicca, bilateral ([place]) 04/01/2014   Primary open angle glaucoma 02/27/2014   Flat feet 01/29/2014   Foot pain, bilateral 01/29/2014   Tinea pedis of both feet 01/29/2014   Ptosis of both eyelids 11/25/2013   Type 2 diabetes mellitus without retinopathy ([place]) 11/25/2013   Onychomycosis 11/57/2620   Chronic systolic heart failure ([place]) 11/11/2013   Emphysema of lung ([place]) 11/11/2013   Essential hypertension 11/11/2013   Screen for colon cancer 11/05/2013   Cardiomyopathy ([place]) 35/59/7416   Systolic HF (heart failure)  ([place]) 38/45/3646   Acute systolic CHF (congestive heart failure) ([place]) 09/16/2013   COPD (chronic obstructive pulmonary disease) ([place]) 09/16/2013   HTN (hypertension) 09/16/2013   Obesity 09/16/2013   PAF (paroxysmal atrial fibrillation) ([place]) 09/16/2013   Senile nuclear sclerosis 05/22/2013   Muscle weakness (generalized) 11/11/2009   Long term current use of anticoagulant therapy 06/17/2009   Pulmonary embolism ([place]) 06/17/2009   ED (erectile dysfunction) of organic origin 05/13/2003    Janna Arch, PT, DPT  01/18/2021, 8:49 AM  [place] MAIN [facility] SERVICES [address] [place], [place], [address] Phone: [phone]   Fax:  [phone]  Name: Mario [name] MRN: [id number] Date of Birth: 18-May-1945

## 2021-01-20 ENCOUNTER — Other Ambulatory Visit: Payer: Self-pay

## 2021-01-20 ENCOUNTER — Ambulatory Visit: Payer: Medicare Other

## 2021-01-20 DIAGNOSIS — R269 Unspecified abnormalities of gait and mobility: Secondary | ICD-10-CM

## 2021-01-20 DIAGNOSIS — R262 Difficulty in walking, not elsewhere classified: Secondary | ICD-10-CM

## 2021-01-20 DIAGNOSIS — M6281 Muscle weakness (generalized): Secondary | ICD-10-CM

## 2021-01-20 NOTE — Therapy (Signed)
[facility] MAIN REHAB SERVICES [address] [place], [place], [address] Phone: [phone]   Fax:  [phone]  Physical Therapy Treatment  Patient Details  Name: Mario [name] MRN: [id number] Date of Birth: 10/17/1945 Referring Provider (PT): [name], [name], NP   Encounter Date: 01/20/2021   PT End of Session - 01/20/21 0805     Visit Number 13    Number of Visits 17    Date for PT Re-Evaluation 01/25/21    Authorization Type next session 3/10 PN 9/13    PT Start Time 0759    PT Stop Time 0844    PT Time Calculation (min) 45 min    Equipment Utilized During Treatment Gait belt    Activity Tolerance Patient tolerated treatment well;Treatment limited secondary to medical complications (Comment);Other (comment)   Elevated BP readings            Past Medical History:  Diagnosis Date   Arthritis    hands, knees   Atrial fibrillation (HCC)    CHF (congestive heart failure) (HCC)    COPD (chronic obstructive pulmonary disease) (HCC)    Diabetes mellitus without complication (HCC)    Diabetic neuropathy (HCC)    Heart murmur    in past, mild 1969   Hypertension    resolved after weight loss and more activity   Neuromuscular disorder (HCC)    neuropathy   Neuropathy    Pulmonary embolism (HCC)    approx 2014   Shingles '99   Sleep apnea    CPAP   Wears dentures    full upper and lower    Past Surgical History:  Procedure Laterality Date   CATARACT EXTRACTION W/PHACO Left 05/12/2019   Procedure: CATARACT EXTRACTION PHACO AND INTRAOCULAR LENS PLACEMENT (IOC) LEFTDIABETIC ISTENT INJ 2.43  00:28.6;  Surgeon: King, Bradley Mark, MD;  Location: [facility];  Service: Ophthalmology;  Laterality: Left;  Diabetic - oral meds sleep apnea   CATARACT EXTRACTION W/PHACO Right 06/02/2019   Procedure: CATARACT EXTRACTION PHACO AND INTRAOCULAR LENS PLACEMENT (IOC) ISTENT INJ  RIGHT DIABETIC 1.31  00:25.4;  Surgeon: King, Bradley  Mark, MD;  Location: [facility];  Service: Ophthalmology;  Laterality: Right;   COLONOSCOPY  08/30/2015   FINGER SURGERY Right    3'rd finger, right hand   KNEE SURGERY Right    screw in knee   WRIST SURGERY Left    metal removed    There were no vitals filed for this visit.   Subjective Assessment - 01/20/21 0802     Subjective Patient reports he didn't have his BP meds the past few days but got new pills now. No falls or LOB since last session.    Pertinent History Pt is a 74 y.o. male referred to OPPT for LE weakness, bilat join OA, and diabetic peripheral neuropathy. PMH includes: Paroxysmal A-fib, COPD, DM Type 2, CHF, OA of bilat knees, HTN, glaucoma of both eyes. Pt reports losing weight 265 lbs to 210 lbs and this is when he noticed he started having generalized weakness and knee pain. Has received steroid injections which temporarily helped (~1 year ago). Pt having difficulty in bowling for fun, walking to complete community tasks and ADL's (requires electric buggy for shopping). Reports walking ~ 12 meters. Pt does report NO falls besides slipping on lane with bowling. Has had near falls but has used walls to catch himself. Pt reports being on 2 L/min for COPD, denies any balance impairments. Pt's goals   for PT are to return to bowling, and complete community walking tasks without having to take frequent seated rest breaks.    Limitations Lifting;Standing;Walking;House hold activities    How long can you sit comfortably? unlimited    How long can you stand comfortably? 3 minutes    How long can you walk comfortably? length of hallway in gym (10-12 m)    Patient Stated Goals Return to community ambulation.    Currently in Pain? Yes    Pain Score 4     Pain Location Knee    Pain Orientation Left    Pain Descriptors / Indicators Aching    Pain Type Chronic pain    Pain Onset Yesterday    Pain Frequency Intermittent              BP at start of session: 147/94   3L  of oxygen via nasal cannula throughout session with vitals monitored throughout session.  SpO2 ranged between 93-100%.    Treatment:  Nustep Lvl 5  seat position 9; cues for keeping SPM>60; 4 minutes for cardiovascular support    Precor leg press bilateral LE"s #55, requires min A for placement of feet, cues for keeping knees from hyperextension 15x ; 2 sets   Precore leg press: single LE: #25 each LE; 12x each LE, cue for keeping knee from hyperextending. Cue for reduced velocity for improved muscle activation sequencing. Stabilization to lateral aspect of L knee required for pain reduction    Ambulate 160 ft without AD, pushing oxygen, cues for breathing in through nose and out through mouth. Close CGA required throughout session.    Sit to stand throw ball to PT 10x   Seated:  -adduction ball squeeze 15x 3 second holds -seated LAQ with rainbow ball between feet 10x   -lateral step over/back hedgehog 15x each LE  -BTB abduction 15x   Pt educated throughout session about proper posture and technique with exercises. Improved exercise technique, movement at target joints, use of target muscles after min to mod verbal, visual, tactile cues.       Patient presents with excellent motivation throughout physical therapy session. Is progressing with strength with decreased episodes of knee pain limiting mobility. Occasional shortness of breath requires constant monitoring of Sp02. Patient continues to want to progress his ability to bend down as well as strengthen legs. Patient will benefit from skilled physical therapy improve patient's ambulation capacity, mobility, strength, and independence with IADLs/ADLS                     PT Education - 01/20/21 0804     Education Details exercise technique, body mechanics    Person(s) Educated Patient    Methods Explanation;Demonstration;Tactile cues;Verbal cues    Comprehension Verbalized understanding;Returned  demonstration;Verbal cues required;Tactile cues required              PT Short Term Goals - 01/11/21 0818       PT SHORT TERM GOAL #1   Title Pt will be independent with HEP to improve strength, balance, endurance to improve community ADL's.    Baseline 8/2: Initiate next session 9/13: intermittent compliance    Time 4    Period Weeks    Status Partially Met    Target Date 12/28/20               PT Long Term Goals - 01/11/21 0816       PT LONG TERM GOAL #1   Title Pt   will increase FOTO to target to demonstrate improved ability to complete functional mobility and ADL's.    Baseline 8/2: 48/64 9/13: 57%    Time 8    Period Weeks    Status Partially Met    Target Date 01/25/21      PT LONG TERM GOAL #2   Title Pt will improve 5xSTS time to < 10 sec to demonstrate clinically significant improvement in BLE strength.    Baseline 8/2: 13.8 sec 9/13: 12.41 seconds    Time 8    Period Weeks    Status Partially Met    Target Date 01/25/21      PT LONG TERM GOAL #3   Title Pt will improve gait speed to at least 1.0 m/s to display evidenced norms to safely complete community wlaking tasks with decreased risk of falls.    Baseline 8/2: 0.678 m/s 9/13: 0.7 m/s    Time 8    Period Weeks    Status Partially Met    Target Date 01/25/21      PT LONG TERM GOAL #4   Title Pt will improve 6MWT to at least 1000' to demo ability to complete community distance walking tasks.    Baseline 8/2: Deferred due to BP readings. 8/4: 64 ft 9/13: 120 ft in 1 min 20 seconds    Time 8    Period Weeks    Status Partially Met    Target Date 01/25/21                   Plan - 01/20/21 0816     Clinical Impression Statement Patient presents with excellent motivation throughout physical therapy session. Is progressing with strength with decreased episodes of knee pain limiting mobility. Occasional shortness of breath requires constant monitoring of Sp02. Patient continues to want to  progress his ability to bend down as well as strengthen legs. Patient will benefit from skilled physical therapy improve patient's ambulation capacity, mobility, strength, and independence with IADLs/ADLS    Personal Factors and Comorbidities Time since onset of injury/illness/exacerbation;Age;Comorbidity 3+;Education;Past/Current Experience;Fitness    Comorbidities PMH includes: Paroxysmal A-fib, COPD, DM Type 2, CHF, OA of bilat knees, HTN, glaucoma of both eyes.    Examination-Activity Limitations Stairs;Squat;Stand;Locomotion Level    Examination-Participation Restrictions Community Activity    Stability/Clinical Decision Making Evolving/Moderate complexity    Rehab Potential Fair    PT Frequency 2x / week    PT Duration 8 weeks    PT Treatment/Interventions ADLs/Self Care Home Management;Cryotherapy;Gait training;Therapeutic exercise;Patient/family education;Manual techniques;Passive range of motion;Spinal Manipulations;Dry needling;Aquatic Therapy;Electrical Stimulation;Stair training;Functional mobility training;Neuromuscular re-education;Therapeutic activities;Balance training;Energy conservation;Moist Heat    PT Next Visit Plan BP, strengthening    PT Home Exercise Plan Next session    Consulted and Agree with Plan of Care Patient             Patient will benefit from skilled therapeutic intervention in order to improve the following deficits and impairments:  Abnormal gait, Improper body mechanics, Decreased mobility, Decreased activity tolerance, Decreased balance, Decreased endurance, Decreased range of motion, Decreased strength, Difficulty walking, Decreased safety awareness, Impaired vision/preception  Visit Diagnosis: Abnormality of gait and mobility  Muscle weakness (generalized)  Difficulty in walking, not elsewhere classified     Problem List Patient Active Problem List   Diagnosis Date Noted   Bilateral primary osteoarthritis of knee 06/11/2019   Cervical  radicular pain 06/11/2019   Cervical facet joint syndrome 06/11/2019   Lumbar facet arthropathy 06/11/2019   Lumbar   radiculopathy 06/11/2019   Chronic pain syndrome 06/11/2019   Lung nodule 02/09/2016   Exercise hypoxemia 12/28/2015   Chronic obstructive pulmonary disease ([place]) 03/16/2015   Nuclear sclerosis 03/16/2015   OSA (obstructive sleep apnea) 03/16/2015   Primary open angle glaucoma of both eyes, severe stage 03/16/2015   Type 2 diabetes mellitus without complication, without long-term current use of insulin ([place]) 03/16/2015   Arthralgia of left lower leg 10/29/2014   Osteoarthritis of left knee 09/10/2014   Type 2 diabetes mellitus with diabetic neuropathy ([place]) 08/04/2014   Cataract, nuclear sclerotic senile 04/01/2014   Combined forms of age-related cataract 04/01/2014   Keratitis sicca, bilateral ([place]) 04/01/2014   Primary open angle glaucoma 02/27/2014   Flat feet 01/29/2014   Foot pain, bilateral 01/29/2014   Tinea pedis of both feet 01/29/2014   Ptosis of both eyelids 11/25/2013   Type 2 diabetes mellitus without retinopathy ([place]) 11/25/2013   Onychomycosis 62/83/6629   Chronic systolic heart failure ([place]) 11/11/2013   Emphysema of lung ([place]) 11/11/2013   Essential hypertension 11/11/2013   Screen for colon cancer 11/05/2013   Cardiomyopathy ([place]) 47/65/4650   Systolic HF (heart failure) ([place]) 35/46/5681   Acute systolic CHF (congestive heart failure) ([place]) 09/16/2013   COPD (chronic obstructive pulmonary disease) ([place]) 09/16/2013   HTN (hypertension) 09/16/2013   Obesity 09/16/2013   PAF (paroxysmal atrial fibrillation) ([place]) 09/16/2013   Senile nuclear sclerosis 05/22/2013   Muscle weakness (generalized) 11/11/2009   Long term current use of anticoagulant therapy 06/17/2009   Pulmonary embolism ([place]) 06/17/2009   ED (erectile dysfunction) of organic origin 05/13/2003    Janna Arch, PT, DPT  01/20/2021, 8:45 AM  [place] MAIN [facility] SERVICES [address] [place], [place], [address] Phone: [phone]   Fax:  [phone]  Name: Mario [name] MRN: [id number] Date of Birth: [birth year]

## 2021-01-25 ENCOUNTER — Ambulatory Visit: Payer: Medicare Other

## 2021-01-25 NOTE — Addendum Note (Signed)
Addended by: Georgia Duff IV on: 01/25/2021 12:19 PM   Modules accepted: Orders

## 2021-01-27 ENCOUNTER — Ambulatory Visit: Payer: Medicare Other

## 2021-02-01 ENCOUNTER — Ambulatory Visit: Payer: Medicare Other | Attending: Nurse Practitioner

## 2021-02-01 ENCOUNTER — Other Ambulatory Visit: Payer: Self-pay

## 2021-02-01 DIAGNOSIS — R269 Unspecified abnormalities of gait and mobility: Secondary | ICD-10-CM | POA: Diagnosis not present

## 2021-02-01 DIAGNOSIS — R262 Difficulty in walking, not elsewhere classified: Secondary | ICD-10-CM | POA: Diagnosis present

## 2021-02-01 DIAGNOSIS — M6281 Muscle weakness (generalized): Secondary | ICD-10-CM | POA: Insufficient documentation

## 2021-02-01 NOTE — Therapy (Signed)
[place] MAIN [facility] SERVICES [address] [place], [place], [address] Phone: [phone]   Fax:  [phone]  Physical Therapy Treatment/RECERT  Patient Details  Name: Mario [name] MRN: [id number] Date of Birth: Jan 03, 1946 Referring Provider (PT): [name], Victoriano Lain, NP   Encounter Date: 02/01/2021   PT End of Session - 02/01/21 0804     Visit Number 14    Number of Visits 30    Date for PT Re-Evaluation 03/29/21    Authorization Type next session 3/10 PN 9/13    PT Start Time 0759    PT Stop Time 0844    PT Time Calculation (min) 45 min    Equipment Utilized During Treatment Gait belt    Activity Tolerance Patient tolerated treatment well;Treatment limited secondary to medical complications (Comment);Other (comment)   Elevated BP readings            Past Medical History:  Diagnosis Date   Arthritis    hands, knees   Atrial fibrillation (HCC)    CHF (congestive heart failure) (HCC)    COPD (chronic obstructive pulmonary disease) (HCC)    Diabetes mellitus without complication (HCC)    Diabetic neuropathy (HCC)    Heart murmur    in past, mild 1969   Hypertension    resolved after weight loss and more activity   Neuromuscular disorder ([place])    neuropathy   Neuropathy    Pulmonary embolism ([place])    approx 2014   Shingles '99   Sleep apnea    CPAP   Wears dentures    full upper and lower    Past Surgical History:  Procedure Laterality Date   CATARACT EXTRACTION W/PHACO Left 05/12/2019   Procedure: CATARACT EXTRACTION PHACO AND INTRAOCULAR LENS PLACEMENT ([place]) LEFTDIABETIC ISTENT INJ 2.43  00:28.6;  Surgeon: Eulogio Bear, MD;  Location: [facility];  Service: Ophthalmology;  Laterality: Left;  Diabetic - oral meds sleep apnea   CATARACT EXTRACTION W/PHACO Right 06/02/2019   Procedure: CATARACT EXTRACTION PHACO AND INTRAOCULAR LENS PLACEMENT ([facility]) ISTENT INJ  RIGHT DIABETIC 1.31  00:25.4;  Surgeon: Eulogio Bear, MD;  Location: [facility];  Service: Ophthalmology;  Laterality: Right;   COLONOSCOPY  08/30/2015   FINGER SURGERY Right    3'rd finger, right hand   KNEE SURGERY Right    screw in knee   WRIST SURGERY Left    metal removed    There were no vitals filed for this visit.   Subjective Assessment - 02/01/21 0801     Subjective Patient missed last week due to not feeling well after COVID vaccine. Was in bed/at home all day.    Pertinent History Pt is a 75 y.o. male referred to OPPT for LE weakness, bilat join OA, and diabetic peripheral neuropathy. PMH includes: Paroxysmal A-fib, COPD, DM Type 2, CHF, OA of bilat knees, HTN, glaucoma of both eyes. Pt reports losing weight 265 lbs to 210 lbs and this is when he noticed he started having generalized weakness and knee pain. Has received steroid injections which temporarily helped (~1 year ago). Pt having difficulty in bowling for fun, walking to complete community tasks and ADL's (requires electric buggy for shopping). Reports walking ~ 12 meters. Pt does report NO falls besides slipping on lane with bowling. Has had near falls but has used walls to catch himself. Pt reports being on 2 L/min for COPD, denies any balance impairments. Pt's goals for PT are to return to  bowling, and complete community walking tasks without having to take frequent seated rest breaks.    Limitations Lifting;Standing;Walking;House hold activities    How long can you sit comfortably? unlimited    How long can you stand comfortably? 3 minutes    How long can you walk comfortably? length of hallway in gym (10-12 m)    Patient Stated Goals Return to community ambulation.    Currently in Pain? Yes    Pain Score 3     Pain Location Knee    Pain Orientation Left    Pain Descriptors / Indicators Aching    Pain Type Chronic pain    Pain Onset Yesterday    Pain Frequency Intermittent                [facility] PT Assessment - 02/01/21 0001        Standardized Balance Assessment   Standardized Balance Assessment Berg Balance Test      Berg Balance Test   Sit to Stand Able to stand without using hands and stabilize independently    Standing Unsupported Able to stand 2 minutes with supervision    Sitting with Back Unsupported but Feet Supported on Floor or Stool Able to sit safely and securely 2 minutes    Stand to Sit Sits safely with minimal use of hands    Transfers Able to transfer safely, definite need of hands    Standing Unsupported with Eyes Closed Able to stand 10 seconds with supervision    Standing Unsupported with Feet Together Able to place feet together independently but unable to hold for 30 seconds    From Standing, Reach Forward with Outstretched Arm Can reach forward >12 cm safely (5")    From Standing Position, Pick up Object from Floor Able to pick up shoe, needs supervision    From Standing Position, Turn to Look Behind Over each Shoulder Needs supervision when turning    Turn 360 Degrees Needs close supervision or verbal cueing    Standing Unsupported, Alternately Place Feet on Step/Stool Able to stand independently and complete 8 steps >20 seconds    Standing Unsupported, One Foot in Front Needs help to step but can hold 15 seconds    Standing on One Leg Tries to lift leg/unable to hold 3 seconds but remains standing independently    Total Score 36           Patient is not wearing braces today:    RECERT BP at start of session 151/113 taken again with with jacket off and repositioned with cues to not move arm:  Second reading 153/72   FOTO: 57% 5x STS 15.22 seconds no UE support ; second attempt 12.3 seconds arms crossed  10 MWT 11.5 seconds =0.87 m/s  6 MWT: 200 ft in 2 min 58 seconds   New goal:  BERG: 36/56   Oxygen provided via nasal cannula with 3L of 02.   Pt educated throughout session about proper posture and technique with exercises. Improved exercise technique, movement at target joints,  use of target muscles after min to mod verbal, visual, tactile cues.       Patient is improving his gait capacity and duration with increased 6 minute walk test ambulating 2 mins 58 seconds compared to previous testing. His gait speed is improving as seen in 10 MWT. New goal addressing stabilization performed with patient scoring 36/56 on BERG. Current POC remains appropriate with additional focus on stability in future sessions. Patient will benefit from  skilled physical therapy improve patient's ambulation capacity, mobility, strength, and independence with IADLs/ADLS             PT Education - 02/01/21 0804     Education Details goals, BP measuring    Person(s) Educated Patient    Methods Explanation;Demonstration;Tactile cues;Verbal cues    Comprehension Verbalized understanding;Verbal cues required;Returned demonstration;Tactile cues required              PT Short Term Goals - 02/01/21 1239       PT SHORT TERM GOAL #1   Title Pt will be independent with HEP to improve strength, balance, endurance to improve community ADL's.    Baseline 8/2: Initiate next session 9/13: intermittent compliance 10/4: intermittent compliance    Time 4    Period Weeks    Status Partially Met    Target Date 03/01/21               PT Long Term Goals - 02/01/21 1236       PT LONG TERM GOAL #1   Title Pt will increase FOTO to target to demonstrate improved ability to complete functional mobility and ADL's.    Baseline 8/2: 48/64 9/13: 57% 10/4:  57%    Time 8    Period Weeks    Status Partially Met    Target Date 03/29/21      PT LONG TERM GOAL #2   Title Pt will improve 5xSTS time to < 10 sec to demonstrate clinically significant improvement in BLE strength.    Baseline 8/2: 13.8 sec 9/13: 12.41 seconds 10/4: 12.3 second arms crossed    Time 8    Period Weeks    Status Partially Met    Target Date 03/29/21      PT LONG TERM GOAL #3   Title Pt will improve gait speed  to at least 1.0 m/s to display evidenced norms to safely complete community wlaking tasks with decreased risk of falls.    Baseline 8/2: 0.678 m/s 9/13: 0.7 m/s 10/4: 0.87 m/s    Time 8    Period Weeks    Status Partially Met    Target Date 03/29/21      PT LONG TERM GOAL #4   Title Pt will improve 6MWT to at least 1000' to demo ability to complete community distance walking tasks.    Baseline 8/2: Deferred due to BP readings. 8/4: 64 ft 9/13: 120 ft in 1 min 20 seconds 10/4: 200 ft with 2 min 58 seconds    Time 8    Period Weeks    Status Partially Met    Target Date 03/29/21      PT LONG TERM GOAL #5   Title Patient will increase Berg Balance score by > 6 points (42/56 ) to demonstrate decreased fall risk during functional activities.    Baseline 10/4: 36/56    Time 8    Period Weeks    Status New    Target Date 03/29/21                   Plan - 02/01/21 1241     Clinical Impression Statement Patient is improving his gait capacity and duration with increased 6 minute walk test ambulating 2 mins 58 seconds compared to previous testing. His gait speed is improving as seen in 10 MWT. New goal addressing stabilization performed with patient scoring 36/56 on BERG. Current POC remains appropriate with additional focus on stability in future sessions. Patient  will benefit from skilled physical therapy improve patient's ambulation capacity, mobility, strength, and independence with IADLs/ADLS    Personal Factors and Comorbidities Time since onset of injury/illness/exacerbation;Age;Comorbidity 3+;Education;Past/Current Experience;Fitness    Comorbidities PMH includes: Paroxysmal A-fib, COPD, DM Type 2, CHF, OA of bilat knees, HTN, glaucoma of both eyes.    Examination-Activity Limitations Stairs;Squat;Stand;Locomotion Level    Examination-Participation Restrictions Community Activity    Stability/Clinical Decision Making Evolving/Moderate complexity    Rehab Potential Fair    PT  Frequency 2x / week    PT Duration 8 weeks    PT Treatment/Interventions ADLs/Self Care Home Management;Cryotherapy;Gait training;Therapeutic exercise;Patient/family education;Manual techniques;Passive range of motion;Spinal Manipulations;Dry needling;Aquatic Therapy;Electrical Stimulation;Stair training;Functional mobility training;Neuromuscular re-education;Therapeutic activities;Balance training;Energy conservation;Moist Heat    PT Next Visit Plan BP, strengthening    PT Home Exercise Plan Next session    Consulted and Agree with Plan of Care Patient             Patient will benefit from skilled therapeutic intervention in order to improve the following deficits and impairments:  Abnormal gait, Improper body mechanics, Decreased mobility, Decreased activity tolerance, Decreased balance, Decreased endurance, Decreased range of motion, Decreased strength, Difficulty walking, Decreased safety awareness, Impaired vision/preception  Visit Diagnosis: Abnormality of gait and mobility  Muscle weakness (generalized)  Difficulty in walking, not elsewhere classified     Problem List Patient Active Problem List   Diagnosis Date Noted   Bilateral primary osteoarthritis of knee 06/11/2019   Cervical radicular pain 06/11/2019   Cervical facet joint syndrome 06/11/2019   Lumbar facet arthropathy 06/11/2019   Lumbar radiculopathy 06/11/2019   Chronic pain syndrome 06/11/2019   Lung nodule 02/09/2016   Exercise hypoxemia 12/28/2015   Chronic obstructive pulmonary disease ([place]) 03/16/2015   Nuclear sclerosis 03/16/2015   OSA (obstructive sleep apnea) 03/16/2015   Primary open angle glaucoma of both eyes, severe stage 03/16/2015   Type 2 diabetes mellitus without complication, without long-term current use of insulin ([place]) 03/16/2015   Arthralgia of left lower leg 10/29/2014   Osteoarthritis of left knee 09/10/2014   Type 2 diabetes mellitus with diabetic neuropathy ([place]) 08/04/2014    Cataract, nuclear sclerotic senile 04/01/2014   Combined forms of age-related cataract 04/01/2014   Keratitis sicca, bilateral ([place]) 04/01/2014   Primary open angle glaucoma 02/27/2014   Flat feet 01/29/2014   Foot pain, bilateral 01/29/2014   Tinea pedis of both feet 01/29/2014   Ptosis of both eyelids 11/25/2013   Type 2 diabetes mellitus without retinopathy ([place]) 11/25/2013   Onychomycosis 69/79/4801   Chronic systolic heart failure ([place]) 11/11/2013   Emphysema of lung ([place]) 11/11/2013   Essential hypertension 11/11/2013   Screen for colon cancer 11/05/2013   Cardiomyopathy ([place]) 65/53/7482   Systolic HF (heart failure) ([place]) 70/78/6754   Acute systolic CHF (congestive heart failure) ([place]) 09/16/2013   COPD (chronic obstructive pulmonary disease) ([place]) 09/16/2013   HTN (hypertension) 09/16/2013   Obesity 09/16/2013   PAF (paroxysmal atrial fibrillation) ([place]) 09/16/2013   Senile nuclear sclerosis 05/22/2013   Muscle weakness (generalized) 11/11/2009   Long term current use of anticoagulant therapy 06/17/2009   Pulmonary embolism ([place]) 06/17/2009   ED (erectile dysfunction) of organic origin 05/13/2003   Janna Arch, PT, DPT  02/01/2021, 12:42 PM  [place] MAIN [facility] SERVICES [address] [place], [place], [address] Phone: [phone]   Fax:  [phone]  Name: Mario [name] MRN: [id number] Date of Birth: [birth year]

## 2021-02-03 ENCOUNTER — Ambulatory Visit: Payer: Medicare Other

## 2021-02-03 ENCOUNTER — Other Ambulatory Visit: Payer: Self-pay

## 2021-02-03 DIAGNOSIS — R269 Unspecified abnormalities of gait and mobility: Secondary | ICD-10-CM

## 2021-02-03 DIAGNOSIS — R262 Difficulty in walking, not elsewhere classified: Secondary | ICD-10-CM

## 2021-02-03 DIAGNOSIS — M6281 Muscle weakness (generalized): Secondary | ICD-10-CM

## 2021-02-03 NOTE — Therapy (Signed)
[place] [facility] MAIN [facility] SERVICES [address] [place], [place], [address] Phone: [phone]   Fax:  [phone]  Patient Details  Name: Mario [name] MRN: [id number] Date of Birth: October 28, 1945 Referring Provider:  Myrene Buddy, *  Encounter Date: 02/03/2021  BP seated L arm 143/104   Patient arrived to PT session in transport chair. Reports his blood pressure was high this morning prior to taking his BP medication. Took his BP meds at 7:30. BP taken in seated on L arm and is 143/104. Patient advised to attend his physician appointment at 2: 30 today as he is above therapeutic range for exercise at this time. Patient's significant other notified of elevation of HR. Session terminated.   Precious Bard, PT, DPT  02/03/2021, 8:16 AM  [place] [facility] MAIN [facility] SERVICES [address] [place], [place], [address] Phone: [phone]   Fax:  [phone]

## 2021-02-08 ENCOUNTER — Ambulatory Visit: Payer: Medicare Other

## 2021-02-09 ENCOUNTER — Other Ambulatory Visit (HOSPITAL_COMMUNITY): Payer: Self-pay | Admitting: Internal Medicine

## 2021-02-09 ENCOUNTER — Other Ambulatory Visit: Payer: Self-pay | Admitting: Internal Medicine

## 2021-02-09 DIAGNOSIS — I824Y3 Acute embolism and thrombosis of unspecified deep veins of proximal lower extremity, bilateral: Secondary | ICD-10-CM

## 2021-02-10 ENCOUNTER — Ambulatory Visit: Payer: Medicare Other

## 2021-02-15 ENCOUNTER — Ambulatory Visit: Payer: Medicare Other

## 2021-02-17 ENCOUNTER — Ambulatory Visit: Payer: Medicare Other

## 2021-02-22 ENCOUNTER — Other Ambulatory Visit: Payer: Self-pay

## 2021-02-22 ENCOUNTER — Ambulatory Visit: Payer: Medicare Other

## 2021-02-22 DIAGNOSIS — M6281 Muscle weakness (generalized): Secondary | ICD-10-CM

## 2021-02-22 DIAGNOSIS — R269 Unspecified abnormalities of gait and mobility: Secondary | ICD-10-CM | POA: Diagnosis not present

## 2021-02-22 DIAGNOSIS — R262 Difficulty in walking, not elsewhere classified: Secondary | ICD-10-CM

## 2021-02-22 NOTE — Therapy (Signed)
[place] MAIN [facility] SERVICES [address] [place], [place], [address] Phone: [phone]   Fax:  [phone]  Physical Therapy Treatment  Patient Details  Name: Shamal Stracener MRN: [id number] Date of Birth: Nov 09, 1945 Referring Provider (PT): [name], Victoriano Lain, NP   Encounter Date: 02/22/2021   PT End of Session - 02/22/21 0810     Visit Number 15    Number of Visits 30    Date for PT Re-Evaluation 03/29/21    Authorization Type 5/10 PN 9/13    PT Start Time 0804    PT Stop Time 0844    PT Time Calculation (min) 40 min    Equipment Utilized During Treatment Gait belt    Activity Tolerance Patient tolerated treatment well;Treatment limited secondary to medical complications (Comment);Other (comment)   Elevated BP readings            Past Medical History:  Diagnosis Date   Arthritis    hands, knees   Atrial fibrillation (HCC)    CHF (congestive heart failure) (HCC)    COPD (chronic obstructive pulmonary disease) (HCC)    Diabetes mellitus without complication (HCC)    Diabetic neuropathy (HCC)    Heart murmur    in past, mild 1969   Hypertension    resolved after weight loss and more activity   Neuromuscular disorder ([place])    neuropathy   Neuropathy    Pulmonary embolism ([place])    approx 2014   Shingles '99   Sleep apnea    CPAP   Wears dentures    full upper and lower    Past Surgical History:  Procedure Laterality Date   CATARACT EXTRACTION W/PHACO Left 05/12/2019   Procedure: CATARACT EXTRACTION PHACO AND INTRAOCULAR LENS PLACEMENT ([place]) LEFTDIABETIC ISTENT INJ 2.43  00:28.6;  Surgeon: Eulogio Bear, MD;  Location: [facility];  Service: Ophthalmology;  Laterality: Left;  Diabetic - oral meds sleep apnea   CATARACT EXTRACTION W/PHACO Right 06/02/2019   Procedure: CATARACT EXTRACTION PHACO AND INTRAOCULAR LENS PLACEMENT ([facility]) ISTENT INJ  RIGHT DIABETIC 1.31  00:25.4;  Surgeon: Eulogio Bear, MD;   Location: [facility];  Service: Ophthalmology;  Laterality: Right;   COLONOSCOPY  08/30/2015   FINGER SURGERY Right    3'rd finger, right hand   KNEE SURGERY Right    screw in knee   WRIST SURGERY Left    metal removed    There were no vitals filed for this visit.   Subjective Assessment - 02/22/21 0809     Subjective The patient has not been seen in past two almost three weeks. Has been out of town in [place]. Patient reports multiple near falls this morning. Seen cardiologist as well as has been assessed for blood clots in LE's.    Pertinent History Pt is a 75 y.o. male referred to OPPT for LE weakness, bilat join OA, and diabetic peripheral neuropathy. PMH includes: Paroxysmal A-fib, COPD, DM Type 2, CHF, OA of bilat knees, HTN, glaucoma of both eyes. Pt reports losing weight 265 lbs to 210 lbs and this is when he noticed he started having generalized weakness and knee pain. Has received steroid injections which temporarily helped (~1 year ago). Pt having difficulty in bowling for fun, walking to complete community tasks and ADL's (requires electric buggy for shopping). Reports walking ~ 12 meters. Pt does report NO falls besides slipping on lane with bowling. Has had near falls but has used walls to catch himself. Pt  reports being on 2 L/min for COPD, denies any balance impairments. Pt's goals for PT are to return to bowling, and complete community walking tasks without having to take frequent seated rest breaks.    Limitations Lifting;Standing;Walking;House hold activities    How long can you sit comfortably? unlimited    How long can you stand comfortably? 3 minutes    How long can you walk comfortably? length of hallway in gym (10-12 m)    Patient Stated Goals Return to community ambulation.    Currently in Pain? Yes    Pain Score 7     Pain Location Knee    Pain Orientation Right;Left    Pain Descriptors / Indicators Aching    Pain Type Chronic pain    Pain Onset Yesterday     Pain Frequency Intermittent             The patient has not been seen in past two almost three weeks. Has been out of town in [place]. Patient reports multiple near falls this morning. Seen cardiologist as well as has been assessed for blood clots in LE's.    BP at start of session: 155/86    3L of oxygen via nasal cannula throughout session with vitals monitored throughout session.  SpO2 ranged between 93-100%.    Treatment:   Standing with CGA next to support surface:  Airex pad: static stand 30 seconds x 2 trials, noticeable trembling of ankles/LE's with fatigue and challenge to maintain stability Airex pad: horizontal head turns 30 seconds scanning room 10x ; cueing for arc of motion  Airex pad: vertical head turns 30 seconds, cueing for arc of motion, noticeable sway with upward gaze increasing demand on ankle righting reaction musculature Airex pad: one foot on 6" step one foot on airex pad, hold position for 30 seconds, switch legs, 2x each LE;   Ambulate 160 ft without AD, pushing oxygen, cues for breathing in through nose and out through mouth. Close CGA required throughout session.    Sit to stand throw ball to PT 10x   Precor leg press bilateral LE"s #55, requires min A for placement of feet, cues for keeping knees from hyperextension 15x ; 2 sets  New Balance HEP  Access Code: [email or web address] URL: [email or web address] Date: 02/22/2021 Prepared by: Janna Arch  Program Notes **Be sure to perform all exercises next to a countertop or sturdy piece of furniture in case you become unsteady.   Exercises Standing Heel Raise with Support - 1 x daily - 7 x weekly - 2 sets - 15 reps Standing Toe Raises at Chair - 1 x daily - 7 x weekly - 2 sets - 15 reps Mini Squat with Counter Support - 1 x daily - 7 x weekly - 2 sets - 15 reps Standing Tandem Balance with Counter Support - 1 x daily - 7 x weekly - 2 sets - 2 reps - 30 hold Standing Single Leg Stance with  Counter Support - 1 x daily - 7 x weekly - 2 sets - 2 reps - 30 hold    Pt educated throughout session about proper posture and technique with exercises. Improved exercise technique, movement at target joints, use of target muscles after min to mod verbal, visual, tactile cues.   Patient has returned to therapy after trip and blood pressure issues. Patient is more unstable this session but has increased capacity for ambulation indicating partial compliance with strengthening but not with stability interventions. Patient given new balance HEP and  demonstrates understanding. Patient will benefit from skilled physical therapy improve patient's ambulation capacity, mobility, strength, and independence with IADLs/ADLS                     PT Education - 02/22/21 0809     Education Details exercise technique, body mechanics    Person(s) Educated Patient    Methods Explanation;Demonstration;Tactile cues;Verbal cues    Comprehension Verbalized understanding;Returned demonstration;Verbal cues required;Tactile cues required              PT Short Term Goals - 02/01/21 1239       PT SHORT TERM GOAL #1   Title Pt will be independent with HEP to improve strength, balance, endurance to improve community ADL's.    Baseline 8/2: Initiate next session 9/13: intermittent compliance 10/4: intermittent compliance    Time 4    Period Weeks    Status Partially Met    Target Date 03/01/21               PT Long Term Goals - 02/01/21 1236       PT LONG TERM GOAL #1   Title Pt will increase FOTO to target to demonstrate improved ability to complete functional mobility and ADL's.    Baseline 8/2: 48/64 9/13: 57% 10/4:  57%    Time 8    Period Weeks    Status Partially Met    Target Date 03/29/21      PT LONG TERM GOAL #2   Title Pt will improve 5xSTS time to < 10 sec to demonstrate clinically significant improvement in BLE strength.    Baseline 8/2: 13.8 sec 9/13: 12.41  seconds 10/4: 12.3 second arms crossed    Time 8    Period Weeks    Status Partially Met    Target Date 03/29/21      PT LONG TERM GOAL #3   Title Pt will improve gait speed to at least 1.0 m/s to display evidenced norms to safely complete community wlaking tasks with decreased risk of falls.    Baseline 8/2: 0.678 m/s 9/13: 0.7 m/s 10/4: 0.87 m/s    Time 8    Period Weeks    Status Partially Met    Target Date 03/29/21      PT LONG TERM GOAL #4   Title Pt will improve 6MWT to at least 1000' to demo ability to complete community distance walking tasks.    Baseline 8/2: Deferred due to BP readings. 8/4: 64 ft 9/13: 120 ft in 1 min 20 seconds 10/4: 200 ft with 2 min 58 seconds    Time 8    Period Weeks    Status Partially Met    Target Date 03/29/21      PT LONG TERM GOAL #5   Title Patient will increase Berg Balance score by > 6 points (42/56 ) to demonstrate decreased fall risk during functional activities.    Baseline 10/4: 36/56    Time 8    Period Weeks    Status New    Target Date 03/29/21                   Plan - 02/22/21 0109     Clinical Impression Statement Patient has returned to therapy after trip and blood pressure issues. Patient is more unstable this session but has increased capacity for ambulation indicating partial compliance with strengthening but not with stability interventions. Patient given new balance HEP and demonstrates understanding. Patient  will benefit from skilled physical therapy improve patient's ambulation capacity, mobility, strength, and independence with IADLs/ADLS    Personal Factors and Comorbidities Time since onset of injury/illness/exacerbation;Age;Comorbidity 3+;Education;Past/Current Experience;Fitness    Comorbidities PMH includes: Paroxysmal A-fib, COPD, DM Type 2, CHF, OA of bilat knees, HTN, glaucoma of both eyes.    Examination-Activity Limitations Stairs;Squat;Stand;Locomotion Level    Examination-Participation Restrictions  Community Activity    Stability/Clinical Decision Making Evolving/Moderate complexity    Rehab Potential Fair    PT Frequency 2x / week    PT Duration 8 weeks    PT Treatment/Interventions ADLs/Self Care Home Management;Cryotherapy;Gait training;Therapeutic exercise;Patient/family education;Manual techniques;Passive range of motion;Spinal Manipulations;Dry needling;Aquatic Therapy;Electrical Stimulation;Stair training;Functional mobility training;Neuromuscular re-education;Therapeutic activities;Balance training;Energy conservation;Moist Heat    PT Next Visit Plan BP, strengthening    PT Home Exercise Plan Next session    Consulted and Agree with Plan of Care Patient             Patient will benefit from skilled therapeutic intervention in order to improve the following deficits and impairments:  Abnormal gait, Improper body mechanics, Decreased mobility, Decreased activity tolerance, Decreased balance, Decreased endurance, Decreased range of motion, Decreased strength, Difficulty walking, Decreased safety awareness, Impaired vision/preception  Visit Diagnosis: Abnormality of gait and mobility  Muscle weakness (generalized)  Difficulty in walking, not elsewhere classified     Problem List Patient Active Problem List   Diagnosis Date Noted   Bilateral primary osteoarthritis of knee 06/11/2019   Cervical radicular pain 06/11/2019   Cervical facet joint syndrome 06/11/2019   Lumbar facet arthropathy 06/11/2019   Lumbar radiculopathy 06/11/2019   Chronic pain syndrome 06/11/2019   Lung nodule 02/09/2016   Exercise hypoxemia 12/28/2015   Chronic obstructive pulmonary disease ([place]) 03/16/2015   Nuclear sclerosis 03/16/2015   OSA (obstructive sleep apnea) 03/16/2015   Primary open angle glaucoma of both eyes, severe stage 03/16/2015   Type 2 diabetes mellitus without complication, without long-term current use of insulin ([place]) 03/16/2015   Arthralgia of left lower leg 10/29/2014    Osteoarthritis of left knee 09/10/2014   Type 2 diabetes mellitus with diabetic neuropathy ([place]) 08/04/2014   Cataract, nuclear sclerotic senile 04/01/2014   Combined forms of age-related cataract 04/01/2014   Keratitis sicca, bilateral ([place]) 04/01/2014   Primary open angle glaucoma 02/27/2014   Flat feet 01/29/2014   Foot pain, bilateral 01/29/2014   Tinea pedis of both feet 01/29/2014   Ptosis of both eyelids 11/25/2013   Type 2 diabetes mellitus without retinopathy ([place]) 11/25/2013   Onychomycosis 35/46/5681   Chronic systolic heart failure ([place]) 11/11/2013   Emphysema of lung ([place]) 11/11/2013   Essential hypertension 11/11/2013   Screen for colon cancer 11/05/2013   Cardiomyopathy ([place]) 27/51/7001   Systolic HF (heart failure) ([place]) 74/94/4967   Acute systolic CHF (congestive heart failure) ([place]) 09/16/2013   COPD (chronic obstructive pulmonary disease) ([place]) 09/16/2013   HTN (hypertension) 09/16/2013   Obesity 09/16/2013   PAF (paroxysmal atrial fibrillation) ([place]) 09/16/2013   Senile nuclear sclerosis 05/22/2013   Muscle weakness (generalized) 11/11/2009   Long term current use of anticoagulant therapy 06/17/2009   Pulmonary embolism ([place]) 06/17/2009   ED (erectile dysfunction) of organic origin 05/13/2003    Janna Arch, PT, DPT  02/22/2021, 8:43 AM  [place] MAIN [facility] SERVICES [address] [place], [place], [address] Phone: [phone]   Fax:  [phone]  Name: Viyaan Champine MRN: [id number] Date of Birth: 1945/10/16

## 2021-02-24 ENCOUNTER — Ambulatory Visit: Payer: Medicare Other

## 2021-02-24 ENCOUNTER — Other Ambulatory Visit: Payer: Self-pay

## 2021-02-24 DIAGNOSIS — M6281 Muscle weakness (generalized): Secondary | ICD-10-CM

## 2021-02-24 DIAGNOSIS — R269 Unspecified abnormalities of gait and mobility: Secondary | ICD-10-CM

## 2021-02-24 DIAGNOSIS — R262 Difficulty in walking, not elsewhere classified: Secondary | ICD-10-CM

## 2021-02-24 NOTE — Therapy (Signed)
[place] MAIN [facility] SERVICES [address] [place], [place], [address] Phone: [phone]   Fax:  [phone]  Physical Therapy Treatment  Patient Details  Name: Mario [name] MRN: [id number] Date of Birth: 26-Jul-1945 Referring Provider (PT): [name], Victoriano Lain, NP   Encounter Date: 02/24/2021   PT End of Session - 02/24/21 0806     Visit Number 16    Number of Visits 30    Date for PT Re-Evaluation 03/29/21    Authorization Type 6/10 PN 9/13    PT Start Time 0801    PT Stop Time 0845    PT Time Calculation (min) 44 min    Equipment Utilized During Treatment Gait belt    Activity Tolerance Patient tolerated treatment well;Treatment limited secondary to medical complications (Comment);Other (comment)   Elevated BP readings            Past Medical History:  Diagnosis Date   Arthritis    hands, knees   Atrial fibrillation (HCC)    CHF (congestive heart failure) (HCC)    COPD (chronic obstructive pulmonary disease) (HCC)    Diabetes mellitus without complication (HCC)    Diabetic neuropathy (HCC)    Heart murmur    in past, mild 1969   Hypertension    resolved after weight loss and more activity   Neuromuscular disorder ([place])    neuropathy   Neuropathy    Pulmonary embolism ([place])    approx 2014   Shingles '99   Sleep apnea    CPAP   Wears dentures    full upper and lower    Past Surgical History:  Procedure Laterality Date   CATARACT EXTRACTION W/PHACO Left 05/12/2019   Procedure: CATARACT EXTRACTION PHACO AND INTRAOCULAR LENS PLACEMENT ([place]) LEFTDIABETIC ISTENT INJ 2.43  00:28.6;  Surgeon: Eulogio Bear, MD;  Location: [facility];  Service: Ophthalmology;  Laterality: Left;  Diabetic - oral meds sleep apnea   CATARACT EXTRACTION W/PHACO Right 06/02/2019   Procedure: CATARACT EXTRACTION PHACO AND INTRAOCULAR LENS PLACEMENT ([facility]) ISTENT INJ  RIGHT DIABETIC 1.31  00:25.4;  Surgeon: Eulogio Bear, MD;   Location: [facility];  Service: Ophthalmology;  Laterality: Right;   COLONOSCOPY  08/30/2015   FINGER SURGERY Right    3'rd finger, right hand   KNEE SURGERY Right    screw in knee   WRIST SURGERY Left    metal removed    There were no vitals filed for this visit.   Subjective Assessment - 02/24/21 0808     Subjective Patient reports he was doing his exercises at home when his dog knocked into him making him lose his balance. He didn't fall to the concrete and has no pain from the incident. Took his BP meds prior to PT session.    Pertinent History Pt is a 75 y.o. male referred to OPPT for LE weakness, bilat join OA, and diabetic peripheral neuropathy. PMH includes: Paroxysmal A-fib, COPD, DM Type 2, CHF, OA of bilat knees, HTN, glaucoma of both eyes. Pt reports losing weight 265 lbs to 210 lbs and this is when he noticed he started having generalized weakness and knee pain. Has received steroid injections which temporarily helped (~1 year ago). Pt having difficulty in bowling for fun, walking to complete community tasks and ADL's (requires electric buggy for shopping). Reports walking ~ 12 meters. Pt does report NO falls besides slipping on lane with bowling. Has had near falls but has used walls to catch  himself. Pt reports being on 2 L/min for COPD, denies any balance impairments. Pt's goals for PT are to return to bowling, and complete community walking tasks without having to take frequent seated rest breaks.    Limitations Lifting;Standing;Walking;House hold activities    How long can you sit comfortably? unlimited    How long can you stand comfortably? 3 minutes    How long can you walk comfortably? length of hallway in gym (10-12 m)    Patient Stated Goals Return to community ambulation.    Currently in Pain? Yes    Pain Location Knee    Pain Orientation Left    Pain Descriptors / Indicators Aching    Pain Type Chronic pain    Pain Onset More than a month ago    Pain  Frequency Constant               BP at start of session: 150/114 ; determined to be faulty due to movement requiring re-reading  Next reading: 117/80   3L of oxygen via nasal cannula throughout session with vitals monitored throughout session.  SpO2 ranged between 93-100%.    Treatment:    Standing with CGA next to support surface:  Airex balance beam: lateral stepping 6x length of // bars no UE support Airex balance beam: forward/backwards tandem walking with finger tip support 8x length of // bars -terminated due to pain.  Airex pad: one foot on 6" step one foot on airex pad, hold position for 30 seconds, switch legs, 2x each LE;   Ambulate 160 ft without AD, pushing oxygen, cues for breathing in through nose and out through mouth. Close CGA required throughout session.    Sit to stand 5x due to pain   Precor leg press bilateral LE"s #55, requires min A for placement of feet, cues for keeping knees from hyperextension 12x ; 2 sets; stabilization band to R knee for pain reduction   unable to perform single limb press with LLE, able to perform 10x with RLE on #25   Seated:  3lb ankle weight:  -LAQ 12x each LE  -march 15x each LE  -heel raise 15x  GTB hamstring curl 15x   Pt educated throughout session about proper posture and technique with exercises. Improved exercise technique, movement at target joints, use of target muscles after min to mod verbal, visual, tactile cues.   Patient presents with excellent motivation throughout physical therapy session. Unstable surfaces continue to be an area of progression. He is limited by LLE strength with leg press this session and is unable to perform single limb press. Patient session limited by knee pain. He is advised to discuss pain limitations with physician and agrees to discuss with doctor in appointment two weeks from now. Patient will benefit from skilled physical therapy improve patient's ambulation capacity, mobility,  strength, and independence with IADLs/ADLS                    PT Education - 02/24/21 0802     Education Details exercise technique, body mechanics    Person(s) Educated Patient    Methods Explanation;Demonstration;Tactile cues;Verbal cues    Comprehension Verbalized understanding;Returned demonstration;Verbal cues required;Tactile cues required              PT Short Term Goals - 02/01/21 1239       PT SHORT TERM GOAL #1   Title Pt will be independent with HEP to improve strength, balance, endurance to improve community ADL's.  Baseline 8/2: Initiate next session 9/13: intermittent compliance 10/4: intermittent compliance    Time 4    Period Weeks    Status Partially Met    Target Date 03/01/21               PT Long Term Goals - 02/01/21 1236       PT LONG TERM GOAL #1   Title Pt will increase FOTO to target to demonstrate improved ability to complete functional mobility and ADL's.    Baseline 8/2: 48/64 9/13: 57% 10/4:  57%    Time 8    Period Weeks    Status Partially Met    Target Date 03/29/21      PT LONG TERM GOAL #2   Title Pt will improve 5xSTS time to < 10 sec to demonstrate clinically significant improvement in BLE strength.    Baseline 8/2: 13.8 sec 9/13: 12.41 seconds 10/4: 12.3 second arms crossed    Time 8    Period Weeks    Status Partially Met    Target Date 03/29/21      PT LONG TERM GOAL #3   Title Pt will improve gait speed to at least 1.0 m/s to display evidenced norms to safely complete community wlaking tasks with decreased risk of falls.    Baseline 8/2: 0.678 m/s 9/13: 0.7 m/s 10/4: 0.87 m/s    Time 8    Period Weeks    Status Partially Met    Target Date 03/29/21      PT LONG TERM GOAL #4   Title Pt will improve 6MWT to at least 1000' to demo ability to complete community distance walking tasks.    Baseline 8/2: Deferred due to BP readings. 8/4: 64 ft 9/13: 120 ft in 1 min 20 seconds 10/4: 200 ft with 2 min 58  seconds    Time 8    Period Weeks    Status Partially Met    Target Date 03/29/21      PT LONG TERM GOAL #5   Title Patient will increase Berg Balance score by > 6 points (42/56 ) to demonstrate decreased fall risk during functional activities.    Baseline 10/4: 36/56    Time 8    Period Weeks    Status New    Target Date 03/29/21                   Plan - 02/24/21 0834     Clinical Impression Statement Patient presents with excellent motivation throughout physical therapy session. Unstable surfaces continue to be an area of progression. He is limited by LLE strength with leg press this session and is unable to perform single limb press. Patient session limited by knee pain. He is advised to discuss pain limitations with physician and agrees to discuss with doctor in appointment two weeks from now. Patient will benefit from skilled physical therapy improve patient's ambulation capacity, mobility, strength, and independence with IADLs/ADLS    Personal Factors and Comorbidities Time since onset of injury/illness/exacerbation;Age;Comorbidity 3+;Education;Past/Current Experience;Fitness    Comorbidities PMH includes: Paroxysmal A-fib, COPD, DM Type 2, CHF, OA of bilat knees, HTN, glaucoma of both eyes.    Examination-Activity Limitations Stairs;Squat;Stand;Locomotion Level    Examination-Participation Restrictions Community Activity    Stability/Clinical Decision Making Evolving/Moderate complexity    Rehab Potential Fair    PT Frequency 2x / week    PT Duration 8 weeks    PT Treatment/Interventions ADLs/Self Care Home Management;Cryotherapy;Gait training;Therapeutic exercise;Patient/family education;Manual  techniques;Passive range of motion;Spinal Manipulations;Dry needling;Aquatic Therapy;Electrical Stimulation;Stair training;Functional mobility training;Neuromuscular re-education;Therapeutic activities;Balance training;Energy conservation;Moist Heat    PT Next Visit Plan BP,  strengthening    PT Home Exercise Plan Next session    Consulted and Agree with Plan of Care Patient             Patient will benefit from skilled therapeutic intervention in order to improve the following deficits and impairments:  Abnormal gait, Improper body mechanics, Decreased mobility, Decreased activity tolerance, Decreased balance, Decreased endurance, Decreased range of motion, Decreased strength, Difficulty walking, Decreased safety awareness, Impaired vision/preception  Visit Diagnosis: Abnormality of gait and mobility  Muscle weakness (generalized)  Difficulty in walking, not elsewhere classified     Problem List Patient Active Problem List   Diagnosis Date Noted   Bilateral primary osteoarthritis of knee 06/11/2019   Cervical radicular pain 06/11/2019   Cervical facet joint syndrome 06/11/2019   Lumbar facet arthropathy 06/11/2019   Lumbar radiculopathy 06/11/2019   Chronic pain syndrome 06/11/2019   Lung nodule 02/09/2016   Exercise hypoxemia 12/28/2015   Chronic obstructive pulmonary disease ([place]) 03/16/2015   Nuclear sclerosis 03/16/2015   OSA (obstructive sleep apnea) 03/16/2015   Primary open angle glaucoma of both eyes, severe stage 03/16/2015   Type 2 diabetes mellitus without complication, without long-term current use of insulin ([place]) 03/16/2015   Arthralgia of left lower leg 10/29/2014   Osteoarthritis of left knee 09/10/2014   Type 2 diabetes mellitus with diabetic neuropathy ([place]) 08/04/2014   Cataract, nuclear sclerotic senile 04/01/2014   Combined forms of age-related cataract 04/01/2014   Keratitis sicca, bilateral ([place]) 04/01/2014   Primary open angle glaucoma 02/27/2014   Flat feet 01/29/2014   Foot pain, bilateral 01/29/2014   Tinea pedis of both feet 01/29/2014   Ptosis of both eyelids 11/25/2013   Type 2 diabetes mellitus without retinopathy ([place]) 11/25/2013   Onychomycosis 67/59/1638   Chronic systolic heart failure ([place]) 11/11/2013    Emphysema of lung ([place]) 11/11/2013   Essential hypertension 11/11/2013   Screen for colon cancer 11/05/2013   Cardiomyopathy ([place]) 46/65/9935   Systolic HF (heart failure) ([place]) 70/17/7939   Acute systolic CHF (congestive heart failure) ([place]) 09/16/2013   COPD (chronic obstructive pulmonary disease) ([place]) 09/16/2013   HTN (hypertension) 09/16/2013   Obesity 09/16/2013   PAF (paroxysmal atrial fibrillation) ([place]) 09/16/2013   Senile nuclear sclerosis 05/22/2013   Muscle weakness (generalized) 11/11/2009   Long term current use of anticoagulant therapy 06/17/2009   Pulmonary embolism ([place]) 06/17/2009   ED (erectile dysfunction) of organic origin 05/13/2003    Janna Arch, PT, DPT  02/24/2021, 8:48 AM  [place] MAIN [facility] SERVICES [address] [place], [place], [address] Phone: [phone]   Fax:  [phone]  Name: Thurl Boen MRN: [id number] Date of Birth: 1946/03/20

## 2021-03-01 ENCOUNTER — Other Ambulatory Visit: Payer: Self-pay

## 2021-03-01 ENCOUNTER — Ambulatory Visit: Payer: Medicare Other | Attending: Nurse Practitioner

## 2021-03-01 DIAGNOSIS — R269 Unspecified abnormalities of gait and mobility: Secondary | ICD-10-CM | POA: Diagnosis not present

## 2021-03-01 DIAGNOSIS — M6281 Muscle weakness (generalized): Secondary | ICD-10-CM | POA: Diagnosis present

## 2021-03-01 DIAGNOSIS — R262 Difficulty in walking, not elsewhere classified: Secondary | ICD-10-CM | POA: Insufficient documentation

## 2021-03-01 NOTE — Therapy (Signed)
[place] MAIN [facility] SERVICES [address] [place], [place], [address] Phone: [phone]   Fax:  [phone]  Physical Therapy Treatment  Patient Details  Name: Mario [name] MRN: [id number] Date of Birth: [birth year] Referring Provider (PT): [name], Victoriano Lain, NP   Encounter Date: 03/01/2021   PT End of Session - 03/01/21 0812     Visit Number 17    Number of Visits 30    Date for PT Re-Evaluation 03/29/21    Authorization Type 6/10 PN 9/13    PT Start Time 0800    PT Stop Time 0844    PT Time Calculation (min) 44 min    Equipment Utilized During Treatment Gait belt    Activity Tolerance Patient tolerated treatment well;Treatment limited secondary to medical complications (Comment);Other (comment)   Elevated BP readings            Past Medical History:  Diagnosis Date   Arthritis    hands, knees   Atrial fibrillation (HCC)    CHF (congestive heart failure) (HCC)    COPD (chronic obstructive pulmonary disease) (HCC)    Diabetes mellitus without complication (HCC)    Diabetic neuropathy (HCC)    Heart murmur    in past, mild 1969   Hypertension    resolved after weight loss and more activity   Neuromuscular disorder ([place])    neuropathy   Neuropathy    Pulmonary embolism ([place])    approx 2014   Shingles '99   Sleep apnea    CPAP   Wears dentures    full upper and lower    Past Surgical History:  Procedure Laterality Date   CATARACT EXTRACTION W/PHACO Left 05/12/2019   Procedure: CATARACT EXTRACTION PHACO AND INTRAOCULAR LENS PLACEMENT ([place]) LEFTDIABETIC ISTENT INJ 2.43  00:28.6;  Surgeon: Eulogio Bear, MD;  Location: [place];  Service: Ophthalmology;  Laterality: Left;  Diabetic - oral meds sleep apnea   CATARACT EXTRACTION W/PHACO Right 06/02/2019   Procedure: CATARACT EXTRACTION PHACO AND INTRAOCULAR LENS PLACEMENT ([facility]) ISTENT INJ  RIGHT DIABETIC 1.31  00:25.4;  Surgeon: Eulogio Bear, MD;   Location: [place];  Service: Ophthalmology;  Laterality: Right;   COLONOSCOPY  08/30/2015   FINGER SURGERY Right    3'rd finger, right hand   KNEE SURGERY Right    screw in knee   WRIST SURGERY Left    metal removed    There were no vitals filed for this visit.   Subjective Assessment - 03/01/21 0806     Subjective Patient is going to seek treatment/physician appointment for his knee pain. Did some of his exercises but his L knee was bothering him. Took his BP meds when PT gave him water.    Pertinent History Pt is a 75 y.o. male referred to OPPT for LE weakness, bilat join OA, and diabetic peripheral neuropathy. PMH includes: Paroxysmal A-fib, COPD, DM Type 2, CHF, OA of bilat knees, HTN, glaucoma of both eyes. Pt reports losing weight 265 lbs to 210 lbs and this is when he noticed he started having generalized weakness and knee pain. Has received steroid injections which temporarily helped (~1 year ago). Pt having difficulty in bowling for fun, walking to complete community tasks and ADL's (requires electric buggy for shopping). Reports walking ~ 12 meters. Pt does report NO falls besides slipping on lane with bowling. Has had near falls but has used walls to catch himself. Pt reports being on 2 L/min for COPD,  denies any balance impairments. Pt's goals for PT are to return to bowling, and complete community walking tasks without having to take frequent seated rest breaks.    Limitations Lifting;Standing;Walking;House hold activities    How long can you sit comfortably? unlimited    How long can you stand comfortably? 3 minutes    How long can you walk comfortably? length of hallway in gym (10-12 m)    Patient Stated Goals Return to community ambulation.    Currently in Pain? Yes    Pain Score 4     Pain Location Knee    Pain Orientation Left    Pain Descriptors / Indicators Aching    Pain Type Chronic pain    Pain Onset More than a month ago    Pain Frequency Intermittent                  Patient is going to seek treatment/physician appointment for his knee pain. Did some of his exercises but his L knee was bothering him.     BP at start of session: 118/90    3L of oxygen via nasal cannula throughout session with vitals monitored throughout session.  SpO2 ranged between 93-100%.    Treatment:   Nustep Lvl 4 ; seat position 10 RPM >80 for cardiovascular and  musculoskeletal challenging  x4 minutes  Standing with CGA next to support surface:   Airex pad: one foot on 6" step one foot on airex pad, hold position for 30 seconds, switch legs, 2x each LE;  Tandem stance with SUE on bar 2x30 seconds each LE placement   Step over half foam roller and back 10x each LE   Lateral step over half foam roller 10x each LE: BUE support -SP02 drop to 88%    Ambulate 160 ft without AD, pushing oxygen, cues for breathing in through nose and out through mouth. Close CGA required throughout session.    Precor leg press bilateral LE"s #55, requires min A for placement of feet, cues for keeping knees from hyperextension 15x ; 2 sets; stabilization band to L knee for pain reduction      Seated:    GTB abduction 15x    Pt educated throughout session about proper posture and technique with exercises. Improved exercise technique, movement at target joints, use of target muscles after min to mod verbal, visual, tactile cues.     Patient tolerates progressive cardiovascular and musculoskeletal challenges well. He has intermittent knee pain that does limit some mobility; patient will be seeking physician consult on knee pain. Single limb and tandem stance is challenging for patient at this time. Patient will benefit from skilled physical therapy improve patient's ambulation capacity, mobility, strength, and independence with IADLs/ADLS                PT Education - 03/01/21 0807     Education Details exercise technique, body mechanics    Person(s)  Educated Patient    Methods Explanation;Demonstration;Tactile cues;Verbal cues    Comprehension Verbalized understanding;Returned demonstration;Verbal cues required;Tactile cues required              PT Short Term Goals - 02/01/21 1239       PT SHORT TERM GOAL #1   Title Pt will be independent with HEP to improve strength, balance, endurance to improve community ADL's.    Baseline 8/2: Initiate next session 9/13: intermittent compliance 10/4: intermittent compliance    Time 4    Period Weeks  Status Partially Met    Target Date 03/01/21               PT Long Term Goals - 02/01/21 1236       PT LONG TERM GOAL #1   Title Pt will increase FOTO to target to demonstrate improved ability to complete functional mobility and ADL's.    Baseline 8/2: 48/64 9/13: 57% 10/4:  57%    Time 8    Period Weeks    Status Partially Met    Target Date 03/29/21      PT LONG TERM GOAL #2   Title Pt will improve 5xSTS time to < 10 sec to demonstrate clinically significant improvement in BLE strength.    Baseline 8/2: 13.8 sec 9/13: 12.41 seconds 10/4: 12.3 second arms crossed    Time 8    Period Weeks    Status Partially Met    Target Date 03/29/21      PT LONG TERM GOAL #3   Title Pt will improve gait speed to at least 1.0 m/s to display evidenced norms to safely complete community wlaking tasks with decreased risk of falls.    Baseline 8/2: 0.678 m/s 9/13: 0.7 m/s 10/4: 0.87 m/s    Time 8    Period Weeks    Status Partially Met    Target Date 03/29/21      PT LONG TERM GOAL #4   Title Pt will improve 6MWT to at least 1000' to demo ability to complete community distance walking tasks.    Baseline 8/2: Deferred due to BP readings. 8/4: 64 ft 9/13: 120 ft in 1 min 20 seconds 10/4: 200 ft with 2 min 58 seconds    Time 8    Period Weeks    Status Partially Met    Target Date 03/29/21      PT LONG TERM GOAL #5   Title Patient will increase Berg Balance score by > 6 points (42/56  ) to demonstrate decreased fall risk during functional activities.    Baseline 10/4: 36/56    Time 8    Period Weeks    Status New    Target Date 03/29/21                   Plan - 03/01/21 0831     Clinical Impression Statement Patient tolerates progressive cardiovascular and musculoskeletal challenges well. He has intermittent knee pain that does limit some mobility; patient will be seeking physician consult on knee pain. Single limb and tandem stance is challenging for patient at this time. Patient will benefit from skilled physical therapy improve patient's ambulation capacity, mobility, strength, and independence with IADLs/ADLS    Personal Factors and Comorbidities Time since onset of injury/illness/exacerbation;Age;Comorbidity 3+;Education;Past/Current Experience;Fitness    Comorbidities PMH includes: Paroxysmal A-fib, COPD, DM Type 2, CHF, OA of bilat knees, HTN, glaucoma of both eyes.    Examination-Activity Limitations Stairs;Squat;Stand;Locomotion Level    Examination-Participation Restrictions Community Activity    Stability/Clinical Decision Making Evolving/Moderate complexity    Rehab Potential Fair    PT Frequency 2x / week    PT Duration 8 weeks    PT Treatment/Interventions ADLs/Self Care Home Management;Cryotherapy;Gait training;Therapeutic exercise;Patient/family education;Manual techniques;Passive range of motion;Spinal Manipulations;Dry needling;Aquatic Therapy;Electrical Stimulation;Stair training;Functional mobility training;Neuromuscular re-education;Therapeutic activities;Balance training;Energy conservation;Moist Heat    PT Next Visit Plan BP, strengthening    PT Home Exercise Plan Next session    Consulted and Agree with Plan of Care Patient  Patient will benefit from skilled therapeutic intervention in order to improve the following deficits and impairments:  Abnormal gait, Improper body mechanics, Decreased mobility, Decreased activity  tolerance, Decreased balance, Decreased endurance, Decreased range of motion, Decreased strength, Difficulty walking, Decreased safety awareness, Impaired vision/preception  Visit Diagnosis: Abnormality of gait and mobility  Muscle weakness (generalized)  Difficulty in walking, not elsewhere classified     Problem List Patient Active Problem List   Diagnosis Date Noted   Bilateral primary osteoarthritis of knee 06/11/2019   Cervical radicular pain 06/11/2019   Cervical facet joint syndrome 06/11/2019   Lumbar facet arthropathy 06/11/2019   Lumbar radiculopathy 06/11/2019   Chronic pain syndrome 06/11/2019   Lung nodule 02/09/2016   Exercise hypoxemia 12/28/2015   Chronic obstructive pulmonary disease ([place]) 03/16/2015   Nuclear sclerosis 03/16/2015   OSA (obstructive sleep apnea) 03/16/2015   Primary open angle glaucoma of both eyes, severe stage 03/16/2015   Type 2 diabetes mellitus without complication, without long-term current use of insulin ([place]) 03/16/2015   Arthralgia of left lower leg 10/29/2014   Osteoarthritis of left knee 09/10/2014   Type 2 diabetes mellitus with diabetic neuropathy ([place]) 08/04/2014   Cataract, nuclear sclerotic senile 04/01/2014   Combined forms of age-related cataract 04/01/2014   Keratitis sicca, bilateral ([place]) 04/01/2014   Primary open angle glaucoma 02/27/2014   Flat feet 01/29/2014   Foot pain, bilateral 01/29/2014   Tinea pedis of both feet 01/29/2014   Ptosis of both eyelids 11/25/2013   Type 2 diabetes mellitus without retinopathy ([place]) 11/25/2013   Onychomycosis 22/29/7989   Chronic systolic heart failure ([place]) 11/11/2013   Emphysema of lung ([place]) 11/11/2013   Essential hypertension 11/11/2013   Screen for colon cancer 11/05/2013   Cardiomyopathy ([place]) 21/19/4174   Systolic HF (heart failure) ([place]) 12/12/4816   Acute systolic CHF (congestive heart failure) ([place]) 09/16/2013   COPD (chronic obstructive pulmonary disease) ([place])  09/16/2013   HTN (hypertension) 09/16/2013   Obesity 09/16/2013   PAF (paroxysmal atrial fibrillation) ([place]) 09/16/2013   Senile nuclear sclerosis 05/22/2013   Muscle weakness (generalized) 11/11/2009   Long term current use of anticoagulant therapy 06/17/2009   Pulmonary embolism ([place]) 06/17/2009   ED (erectile dysfunction) of organic origin 05/13/2003  Janna Arch, PT, DPT  03/01/2021, 8:46 AM  [place] MAIN [facility] SERVICES [address] [place], [place], [address] Phone: [phone]   Fax:  [phone]  Name: Mario [name] MRN: [id number] Date of Birth: [birth year]

## 2021-03-03 ENCOUNTER — Ambulatory Visit: Payer: Medicare Other

## 2021-03-08 ENCOUNTER — Other Ambulatory Visit: Payer: Self-pay

## 2021-03-08 ENCOUNTER — Ambulatory Visit: Payer: Medicare Other

## 2021-03-08 DIAGNOSIS — M6281 Muscle weakness (generalized): Secondary | ICD-10-CM

## 2021-03-08 DIAGNOSIS — R262 Difficulty in walking, not elsewhere classified: Secondary | ICD-10-CM

## 2021-03-08 DIAGNOSIS — R269 Unspecified abnormalities of gait and mobility: Secondary | ICD-10-CM | POA: Diagnosis not present

## 2021-03-08 NOTE — Therapy (Signed)
[place] MAIN [facility] SERVICES [address] [place], [place], [address] Phone: [phone]   Fax:  [phone]  Physical Therapy Treatment  Patient Details  Name: Mario [name] MRN: [id number] Date of Birth: 05-26-1945 Referring Provider (PT): [name], Victoriano Lain, NP   Encounter Date: 03/08/2021   PT End of Session - 03/08/21 0806     Visit Number 18    Number of Visits 30    Date for PT Re-Evaluation 03/29/21    Authorization Type 8/10 PN 9/13    PT Start Time 0800    PT Stop Time 0844    PT Time Calculation (min) 44 min    Equipment Utilized During Treatment Gait belt    Activity Tolerance Patient tolerated treatment well;Treatment limited secondary to medical complications (Comment);Other (comment)   Elevated BP readings            Past Medical History:  Diagnosis Date   Arthritis    hands, knees   Atrial fibrillation (HCC)    CHF (congestive heart failure) (HCC)    COPD (chronic obstructive pulmonary disease) (HCC)    Diabetes mellitus without complication (HCC)    Diabetic neuropathy (HCC)    Heart murmur    in past, mild 1969   Hypertension    resolved after weight loss and more activity   Neuromuscular disorder ([place])    neuropathy   Neuropathy    Pulmonary embolism ([place])    approx 2014   Shingles '99   Sleep apnea    CPAP   Wears dentures    full upper and lower    Past Surgical History:  Procedure Laterality Date   CATARACT EXTRACTION W/PHACO Left 05/12/2019   Procedure: CATARACT EXTRACTION PHACO AND INTRAOCULAR LENS PLACEMENT ([place]) LEFTDIABETIC ISTENT INJ 2.43  00:28.6;  Surgeon: Eulogio Bear, MD;  Location: [place];  Service: Ophthalmology;  Laterality: Left;  Diabetic - oral meds sleep apnea   CATARACT EXTRACTION W/PHACO Right 06/02/2019   Procedure: CATARACT EXTRACTION PHACO AND INTRAOCULAR LENS PLACEMENT ([facility]) ISTENT INJ  RIGHT DIABETIC 1.31  00:25.4;  Surgeon: Eulogio Bear, MD;   Location: [place];  Service: Ophthalmology;  Laterality: Right;   COLONOSCOPY  08/30/2015   FINGER SURGERY Right    3'rd finger, right hand   KNEE SURGERY Right    screw in knee   WRIST SURGERY Left    metal removed    There were no vitals filed for this visit.   Subjective Assessment - 03/08/21 0802     Subjective Patient had his cortisone injection performed yesterday. Brought his braces. Fell down the stairs missing last session. Is feeling better today.    Pertinent History Pt is a 75 y.o. male referred to OPPT for LE weakness, bilat join OA, and diabetic peripheral neuropathy. PMH includes: Paroxysmal A-fib, COPD, DM Type 2, CHF, OA of bilat knees, HTN, glaucoma of both eyes. Pt reports losing weight 265 lbs to 210 lbs and this is when he noticed he started having generalized weakness and knee pain. Has received steroid injections which temporarily helped (~1 year ago). Pt having difficulty in bowling for fun, walking to complete community tasks and ADL's (requires electric buggy for shopping). Reports walking ~ 12 meters. Pt does report NO falls besides slipping on lane with bowling. Has had near falls but has used walls to catch himself. Pt reports being on 2 L/min for COPD, denies any balance impairments. Pt's goals for PT are to return  to bowling, and complete community walking tasks without having to take frequent seated rest breaks.    Limitations Lifting;Standing;Walking;House hold activities    How long can you sit comfortably? unlimited    How long can you stand comfortably? 3 minutes    How long can you walk comfortably? length of hallway in gym (10-12 m)    Patient Stated Goals Return to community ambulation.    Currently in Pain? Yes    Pain Score 3     Pain Location Knee    Pain Orientation Left;Right    Pain Descriptors / Indicators Aching    Pain Type Chronic pain    Pain Onset More than a month ago                     BP at start of session:  149/83    3L of oxygen via nasal cannula throughout session with vitals monitored throughout session.  SpO2 ranged between 93-100%.    Treatment:  Donning of braces: Ossur braces: education on needing L brace to having padding to change from medial side to lateral side for stabilization.    Standing with CGA next to support surface:    Airex pad:one foot each airex pad for modified tandem stance, hold position for 45 seconds, switch legs, 2x each LE;  Step over half foam roller and back 10x each LE    Ambulate 160 ft without AD, pushing oxygen, cues for breathing in through nose and out through mouth. Close CGA required throughout session. Patient reports decrease pain with braces on    Precor leg press bilateral LE"s #55, requires min A for placement of feet, cues for keeping knees from hyperextension 15x ; 2 sets; stabilization band to L knee for pain reduction   Precor leg press single LE #25 15x each LE ; stabilization to lateral aspect of L knee for pain reduction x2 sets each LE    3lb ankle weights:  -lateral stepping 4x length of // bars; BUE support  -forward high knee march/backward high knee march 4x length of // bars -seated upright posture marching without back support 12x each LE  -seated LAQ 3 second hold 10x each LE        Pt educated throughout session about proper posture and technique with exercises. Improved exercise technique, movement at target joints, use of target muscles after min to mod verbal, visual, tactile cues.        Patient presents with his braces. Educated on donning of braces as well as FIT. He is educated on need for L brace correction from medial stabilization to lateral stabilization. Patient tolerated weight addition with strengthening interventions. He is able to perform leg press without pain this session. Patient will benefit from skilled physical therapy improve patient's ambulation capacity, mobility, strength, and independence with  IADLs/ADLS                   PT Education - 03/08/21 0805     Education Details exercise technique, body mechanics    Person(s) Educated Patient    Methods Explanation;Demonstration;Tactile cues;Verbal cues    Comprehension Verbalized understanding;Returned demonstration;Verbal cues required;Tactile cues required              PT Short Term Goals - 02/01/21 1239       PT SHORT TERM GOAL #1   Title Pt will be independent with HEP to improve strength, balance, endurance to improve community ADL's.    Baseline 8/2:  Initiate next session 9/13: intermittent compliance 10/4: intermittent compliance    Time 4    Period Weeks    Status Partially Met    Target Date 03/01/21               PT Long Term Goals - 02/01/21 1236       PT LONG TERM GOAL #1   Title Pt will increase FOTO to target to demonstrate improved ability to complete functional mobility and ADL's.    Baseline 8/2: 48/64 9/13: 57% 10/4:  57%    Time 8    Period Weeks    Status Partially Met    Target Date 03/29/21      PT LONG TERM GOAL #2   Title Pt will improve 5xSTS time to < 10 sec to demonstrate clinically significant improvement in BLE strength.    Baseline 8/2: 13.8 sec 9/13: 12.41 seconds 10/4: 12.3 second arms crossed    Time 8    Period Weeks    Status Partially Met    Target Date 03/29/21      PT LONG TERM GOAL #3   Title Pt will improve gait speed to at least 1.0 m/s to display evidenced norms to safely complete community wlaking tasks with decreased risk of falls.    Baseline 8/2: 0.678 m/s 9/13: 0.7 m/s 10/4: 0.87 m/s    Time 8    Period Weeks    Status Partially Met    Target Date 03/29/21      PT LONG TERM GOAL #4   Title Pt will improve 6MWT to at least 1000' to demo ability to complete community distance walking tasks.    Baseline 8/2: Deferred due to BP readings. 8/4: 64 ft 9/13: 120 ft in 1 min 20 seconds 10/4: 200 ft with 2 min 58 seconds    Time 8    Period Weeks     Status Partially Met    Target Date 03/29/21      PT LONG TERM GOAL #5   Title Patient will increase Berg Balance score by > 6 points (42/56 ) to demonstrate decreased fall risk during functional activities.    Baseline 10/4: 36/56    Time 8    Period Weeks    Status New    Target Date 03/29/21                   Plan - 03/08/21 0839     Clinical Impression Statement Patient presents with his braces. Educated on donning of braces as well as FIT. He is educated on need for L brace correction from medial stabilization to lateral stabilization. Patient tolerated weight addition with strengthening interventions. He is able to perform leg press without pain this session. Patient will benefit from skilled physical therapy improve patient's ambulation capacity, mobility, strength, and independence with IADLs/ADLS    Personal Factors and Comorbidities Time since onset of injury/illness/exacerbation;Age;Comorbidity 3+;Education;Past/Current Experience;Fitness    Comorbidities PMH includes: Paroxysmal A-fib, COPD, DM Type 2, CHF, OA of bilat knees, HTN, glaucoma of both eyes.    Examination-Activity Limitations Stairs;Squat;Stand;Locomotion Level    Examination-Participation Restrictions Community Activity    Stability/Clinical Decision Making Evolving/Moderate complexity    Rehab Potential Fair    PT Frequency 2x / week    PT Duration 8 weeks    PT Treatment/Interventions ADLs/Self Care Home Management;Cryotherapy;Gait training;Therapeutic exercise;Patient/family education;Manual techniques;Passive range of motion;Spinal Manipulations;Dry needling;Aquatic Therapy;Electrical Stimulation;Stair training;Functional mobility training;Neuromuscular re-education;Therapeutic activities;Balance training;Energy conservation;Moist Heat  PT Next Visit Plan BP, strengthening    PT Home Exercise Plan Next session    Consulted and Agree with Plan of Care Patient             Patient will  benefit from skilled therapeutic intervention in order to improve the following deficits and impairments:  Abnormal gait, Improper body mechanics, Decreased mobility, Decreased activity tolerance, Decreased balance, Decreased endurance, Decreased range of motion, Decreased strength, Difficulty walking, Decreased safety awareness, Impaired vision/preception  Visit Diagnosis: Abnormality of gait and mobility  Muscle weakness (generalized)  Difficulty in walking, not elsewhere classified     Problem List Patient Active Problem List   Diagnosis Date Noted   Bilateral primary osteoarthritis of knee 06/11/2019   Cervical radicular pain 06/11/2019   Cervical facet joint syndrome 06/11/2019   Lumbar facet arthropathy 06/11/2019   Lumbar radiculopathy 06/11/2019   Chronic pain syndrome 06/11/2019   Lung nodule 02/09/2016   Exercise hypoxemia 12/28/2015   Chronic obstructive pulmonary disease ([place]) 03/16/2015   Nuclear sclerosis 03/16/2015   OSA (obstructive sleep apnea) 03/16/2015   Primary open angle glaucoma of both eyes, severe stage 03/16/2015   Type 2 diabetes mellitus without complication, without long-term current use of insulin ([place]) 03/16/2015   Arthralgia of left lower leg 10/29/2014   Osteoarthritis of left knee 09/10/2014   Type 2 diabetes mellitus with diabetic neuropathy ([place]) 08/04/2014   Cataract, nuclear sclerotic senile 04/01/2014   Combined forms of age-related cataract 04/01/2014   Keratitis sicca, bilateral ([place]) 04/01/2014   Primary open angle glaucoma 02/27/2014   Flat feet 01/29/2014   Foot pain, bilateral 01/29/2014   Tinea pedis of both feet 01/29/2014   Ptosis of both eyelids 11/25/2013   Type 2 diabetes mellitus without retinopathy ([place]) 11/25/2013   Onychomycosis 16/24/4695   Chronic systolic heart failure ([place]) 11/11/2013   Emphysema of lung ([place]) 11/11/2013   Essential hypertension 11/11/2013   Screen for colon cancer 11/05/2013   Cardiomyopathy  ([place]) 11/19/5748   Systolic HF (heart failure) ([place]) 51/83/3582   Acute systolic CHF (congestive heart failure) ([place]) 09/16/2013   COPD (chronic obstructive pulmonary disease) ([place]) 09/16/2013   HTN (hypertension) 09/16/2013   Obesity 09/16/2013   PAF (paroxysmal atrial fibrillation) ([place]) 09/16/2013   Senile nuclear sclerosis 05/22/2013   Muscle weakness (generalized) 11/11/2009   Long term current use of anticoagulant therapy 06/17/2009   Pulmonary embolism ([place]) 06/17/2009   ED (erectile dysfunction) of organic origin 05/13/2003   Janna Arch, PT, DPT  03/08/2021, 8:45 AM  [place] MAIN [facility] SERVICES [address] [place], [place], [address] Phone: [phone]   Fax:  [phone]  Name: Mario [name] MRN: [id number] Date of Birth: November 08, 1945

## 2021-03-10 ENCOUNTER — Other Ambulatory Visit: Payer: Self-pay

## 2021-03-10 ENCOUNTER — Ambulatory Visit: Payer: Medicare Other

## 2021-03-10 DIAGNOSIS — R269 Unspecified abnormalities of gait and mobility: Secondary | ICD-10-CM | POA: Diagnosis not present

## 2021-03-10 DIAGNOSIS — M6281 Muscle weakness (generalized): Secondary | ICD-10-CM

## 2021-03-10 DIAGNOSIS — R262 Difficulty in walking, not elsewhere classified: Secondary | ICD-10-CM

## 2021-03-10 NOTE — Therapy (Signed)
[place] MAIN [facility] SERVICES [address] [place], [place], [address] Phone: [phone]   Fax:  [phone]  Physical Therapy Treatment  Patient Details  Name: Mario [name] MRN: [id number] Date of Birth: [birth year] Referring Provider (PT): [name], Victoriano Lain, NP   Encounter Date: 03/10/2021   PT End of Session - 03/10/21 0813     Visit Number 19    Number of Visits 30    Date for PT Re-Evaluation 03/29/21    Authorization Type 9/10 PN 9/13    PT Start Time 0800    PT Stop Time 0844    PT Time Calculation (min) 44 min    Equipment Utilized During Treatment Gait belt    Activity Tolerance Patient tolerated treatment well;Treatment limited secondary to medical complications (Comment);Other (comment)   Elevated BP readings            Past Medical History:  Diagnosis Date   Arthritis    hands, knees   Atrial fibrillation (HCC)    CHF (congestive heart failure) (HCC)    COPD (chronic obstructive pulmonary disease) (HCC)    Diabetes mellitus without complication (HCC)    Diabetic neuropathy (HCC)    Heart murmur    in past, mild 1969   Hypertension    resolved after weight loss and more activity   Neuromuscular disorder ([place])    neuropathy   Neuropathy    Pulmonary embolism ([place])    approx 2014   Shingles '99   Sleep apnea    CPAP   Wears dentures    full upper and lower    Past Surgical History:  Procedure Laterality Date   CATARACT EXTRACTION W/PHACO Left 05/12/2019   Procedure: CATARACT EXTRACTION PHACO AND INTRAOCULAR LENS PLACEMENT ([place]) LEFTDIABETIC ISTENT INJ 2.43  00:28.6;  Surgeon: Eulogio Bear, MD;  Location: [facility];  Service: Ophthalmology;  Laterality: Left;  Diabetic - oral meds sleep apnea   CATARACT EXTRACTION W/PHACO Right 06/02/2019   Procedure: CATARACT EXTRACTION PHACO AND INTRAOCULAR LENS PLACEMENT ([facility]) ISTENT INJ  RIGHT DIABETIC 1.31  00:25.4;  Surgeon: Eulogio Bear, MD;   Location: [place];  Service: Ophthalmology;  Laterality: Right;   COLONOSCOPY  08/30/2015   FINGER SURGERY Right    3'rd finger, right hand   KNEE SURGERY Right    screw in knee   WRIST SURGERY Left    metal removed    There were no vitals filed for this visit.   Subjective Assessment - 03/10/21 0812     Subjective Patient reports waking up late and rushing ot make PT appointment in time. Took his BP meds but didn't eat breakfast.    Pertinent History Pt is a 75 y.o. male referred to OPPT for LE weakness, bilat join OA, and diabetic peripheral neuropathy. PMH includes: Paroxysmal A-fib, COPD, DM Type 2, CHF, OA of bilat knees, HTN, glaucoma of both eyes. Pt reports losing weight 265 lbs to 210 lbs and this is when he noticed he started having generalized weakness and knee pain. Has received steroid injections which temporarily helped (~1 year ago). Pt having difficulty in bowling for fun, walking to complete community tasks and ADL's (requires electric buggy for shopping). Reports walking ~ 12 meters. Pt does report NO falls besides slipping on lane with bowling. Has had near falls but has used walls to catch himself. Pt reports being on 2 L/min for COPD, denies any balance impairments. Pt's goals for PT are to return  to bowling, and complete community walking tasks without having to take frequent seated rest breaks.    Limitations Lifting;Standing;Walking;House hold activities    How long can you sit comfortably? unlimited    How long can you stand comfortably? 3 minutes    How long can you walk comfortably? length of hallway in gym (10-12 m)    Patient Stated Goals Return to community ambulation.    Currently in Pain? Yes    Pain Score 2     Pain Location Knee    Pain Orientation Right;Left    Pain Descriptors / Indicators Aching    Pain Type Chronic pain    Pain Onset More than a month ago    Pain Frequency Intermittent              Patient reports waking up late  and rushing ot make PT appointment in time. Took his BP meds but didn't eat breakfast.      BP at start of session: 123/82    3L of oxygen via nasal cannula throughout session with vitals monitored throughout session.  SpO2 ranged between 88-100%.    Treatment:  Donning of braces: O[place]ur braces: education on needing L brace to having padding to change from medial side to lateral side for stabilization.   Nustep Lvl 5; seat position 11; RPM>70 4 minutes for cardiovascular strengthening  Standing with CGA next to support surface:    Airex pad:one foot each airex pad for modified tandem stance, hold position for 45 seconds, switch legs, 2x each LE;   Ambulate 160 ft without AD, pushing oxygen, cues for breathing in through nose and out through mouth. Close CGA required throughout session. Patient reports decrease pain with braces on    Precor leg press bilateral LE"s #55, requires min A for placement of feet, cues for keeping knees from hyperextension 15x ; 2 sets; stabilization band to L knee for pain reduction ; second set #70   Precor leg press single LE #25 15x each LE ; stabilization to lateral aspect of L knee for pain reduction x2 sets each LE    3lb ankle weights:  -lateral stepping 6x length of // bars; BUE support  -forward high knee march/backward high knee march 4x length of // bars -seated upright posture marching without back support 12x each LE  -seated LAQ 3 second hold 10x each LE         Pt educated throughout session about proper posture and technique with exercises. Improved exercise technique, movement at target joints, use of target muscles after min to mod verbal, visual, tactile cues.     Patient re-educated on proper donning of bracing. Education on needing to orthotist for re-fitting of knee braces. Patient is aware next session will address goals. Patient has increased shortness of breath this session requiring constant monitoring of vitals. Patient able  to tolerate increased weight on leg press. Patient will benefit from skilled physical therapy improve patient's ambulation capacity, mobility, strength, and independence with IADLs/ADLS                PT Education - 03/10/21 0813     Education Details exercise technique, call orthotist for knee fitting    Person(s) Educated Patient    Methods Explanation;Demonstration;Tactile cues;Verbal cues    Comprehension Verbalized understanding;Returned demonstration;Verbal cues required;Tactile cues required              PT Short Term Goals - 02/01/21 1239       PT  SHORT TERM GOAL #1   Title Pt will be independent with HEP to improve strength, balance, endurance to improve community ADL's.    Baseline 8/2: Initiate next session 9/13: intermittent compliance 10/4: intermittent compliance    Time 4    Period Weeks    Status Partially Met    Target Date 03/01/21               PT Long Term Goals - 02/01/21 1236       PT LONG TERM GOAL #1   Title Pt will increase FOTO to target to demonstrate improved ability to complete functional mobility and ADL's.    Baseline 8/2: 48/64 9/13: 57% 10/4:  57%    Time 8    Period Weeks    Status Partially Met    Target Date 03/29/21      PT LONG TERM GOAL #2   Title Pt will improve 5xSTS time to < 10 sec to demonstrate clinically significant improvement in BLE strength.    Baseline 8/2: 13.8 sec 9/13: 12.41 seconds 10/4: 12.3 second arms crossed    Time 8    Period Weeks    Status Partially Met    Target Date 03/29/21      PT LONG TERM GOAL #3   Title Pt will improve gait speed to at least 1.0 m/s to display evidenced norms to safely complete community wlaking tasks with decreased risk of falls.    Baseline 8/2: 0.678 m/s 9/13: 0.7 m/s 10/4: 0.87 m/s    Time 8    Period Weeks    Status Partially Met    Target Date 03/29/21      PT LONG TERM GOAL #4   Title Pt will improve 6MWT to at least 1000' to demo ability to complete  community distance walking tasks.    Baseline 8/2: Deferred due to BP readings. 8/4: 64 ft 9/13: 120 ft in 1 min 20 seconds 10/4: 200 ft with 2 min 58 seconds    Time 8    Period Weeks    Status Partially Met    Target Date 03/29/21      PT LONG TERM GOAL #5   Title Patient will increase Berg Balance score by > 6 points (42/56 ) to demonstrate decreased fall risk during functional activities.    Baseline 10/4: 36/56    Time 8    Period Weeks    Status New    Target Date 03/29/21                   Plan - 03/10/21 0827     Clinical Impression Statement Patient re-educated on proper donning of bracing. Education on needing to orthotist for re-fitting of knee braces. Patient is aware next session will address goals. Patient has increased shortness of breath this session requiring constant monitoring of vitals. Patient able to tolerate increased weight on leg press. Patient will benefit from skilled physical therapy improve patient's ambulation capacity, mobility, strength, and independence with IADLs/ADLS    Personal Factors and Comorbidities Time since onset of injury/illness/exacerbation;Age;Comorbidity 3+;Education;Past/Current Experience;Fitness    Comorbidities PMH includes: Paroxysmal A-fib, COPD, DM Type 2, CHF, OA of bilat knees, HTN, glaucoma of both eyes.    Examination-Activity Limitations Stairs;Squat;Stand;Locomotion Level    Examination-Participation Restrictions Community Activity    Stability/Clinical Decision Making Evolving/Moderate complexity    Rehab Potential Fair    PT Frequency 2x / week    PT Duration 8 weeks    PT  Treatment/Interventions ADLs/Self Care Home Management;Cryotherapy;Gait training;Therapeutic exercise;Patient/family education;Manual techniques;Passive range of motion;Spinal Manipulations;Dry needling;Aquatic Therapy;Electrical Stimulation;Stair training;Functional mobility training;Neuromuscular re-education;Therapeutic activities;Balance  training;Energy conservation;Moist Heat    PT Next Visit Plan BP, strengthening    PT Home Exercise Plan Next session    Consulted and Agree with Plan of Care Patient             Patient will benefit from skilled therapeutic intervention in order to improve the following deficits and impairments:  Abnormal gait, Improper body mechanics, Decreased mobility, Decreased activity tolerance, Decreased balance, Decreased endurance, Decreased range of motion, Decreased strength, Difficulty walking, Decreased safety awareness, Impaired vision/preception  Visit Diagnosis: Abnormality of gait and mobility  Muscle weakness (generalized)  Difficulty in walking, not elsewhere classified     Problem List Patient Active Problem List   Diagnosis Date Noted   Bilateral primary osteoarthritis of knee 06/11/2019   Cervical radicular pain 06/11/2019   Cervical facet joint syndrome 06/11/2019   Lumbar facet arthropathy 06/11/2019   Lumbar radiculopathy 06/11/2019   Chronic pain syndrome 06/11/2019   Lung nodule 02/09/2016   Exercise hypoxemia 12/28/2015   Chronic obstructive pulmonary disease ([place]) 03/16/2015   Nuclear sclerosis 03/16/2015   OSA (obstructive sleep apnea) 03/16/2015   Primary open angle glaucoma of both eyes, severe stage 03/16/2015   Type 2 diabetes mellitus without complication, without long-term current use of insulin ([place]) 03/16/2015   Arthralgia of left lower leg 10/29/2014   Osteoarthritis of left knee 09/10/2014   Type 2 diabetes mellitus with diabetic neuropathy ([place]) 08/04/2014   Cataract, nuclear sclerotic senile 04/01/2014   Combined forms of age-related cataract 04/01/2014   Keratitis sicca, 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/01/2014   Primary open angle glaucoma 02/27/2014   Flat feet 01/29/2014   Foot pain, bilateral 01/29/2014   Tinea pedis of both feet 01/29/2014   Ptosis of both eyelids 11/25/2013   Type 2 diabetes mellitus without retinopathy ([place]) 11/25/2013    Onychomycosis 97/74/1423   Chronic systolic heart failure ([place]) 11/11/2013   Emphysema of lung ([place]) 11/11/2013   Essential hypertension 11/11/2013   Screen for colon cancer 11/05/2013   Cardiomyopathy ([place]) 95/32/0233   Systolic HF (heart failure) ([place]) 43/56/8616   Acute systolic CHF (congestive heart failure) ([place]) 09/16/2013   COPD (chronic obstructive pulmonary disease) ([place]) 09/16/2013   HTN (hypertension) 09/16/2013   Obesity 09/16/2013   PAF (paroxysmal atrial fibrillation) ([place]) 09/16/2013   Senile nuclear sclerosis 05/22/2013   Muscle weakness (generalized) 11/11/2009   Long term current use of anticoagulant therapy 06/17/2009   Pulmonary embolism ([place]) 06/17/2009   ED (erectile dysfunction) of organic origin 05/13/2003    Janna Arch, PT, DPT  03/10/2021, 8:44 AM  [place] MAIN [facility] SERVICES [address] [place], [place], [address] Phone: [phone]   Fax:  [phone]  Name: Mario [name] MRN: [id number] Date of Birth: 1945/10/18

## 2021-03-15 ENCOUNTER — Other Ambulatory Visit: Payer: Self-pay

## 2021-03-15 ENCOUNTER — Ambulatory Visit: Payer: Medicare Other

## 2021-03-15 DIAGNOSIS — R262 Difficulty in walking, not elsewhere classified: Secondary | ICD-10-CM

## 2021-03-15 DIAGNOSIS — R269 Unspecified abnormalities of gait and mobility: Secondary | ICD-10-CM | POA: Diagnosis not present

## 2021-03-15 DIAGNOSIS — M6281 Muscle weakness (generalized): Secondary | ICD-10-CM

## 2021-03-15 NOTE — Therapy (Signed)
[place] [facility] [address] [place], [place], [address] Phone: [phone]   Fax:  [phone]  Physical Therapy Treatment / Physical Therapy Progress Note   Dates of reporting period  01/11/21   to   03/15/21   Patient Details  Name: Mario [name] MRN: [id number] Date of Birth: October 16, 1945 Referring Provider (PT): [name], Victoriano Lain, NP   Encounter Date: 03/15/2021   PT End of Session - 03/15/21 0759     Visit Number 20    Number of Visits 30    Date for PT Re-Evaluation 03/29/21    Authorization Type 10/10 PN 9/13; next session 1/10 PN 11/15    PT Start Time 0800    PT Stop Time 0844    PT Time Calculation (min) 44 min    Equipment Utilized During Treatment Gait belt    Activity Tolerance Patient tolerated treatment well;Treatment limited secondary to medical complications (Comment);Other (comment)   Elevated BP readings            Past Medical History:  Diagnosis Date   Arthritis    hands, knees   Atrial fibrillation (HCC)    CHF (congestive heart failure) (HCC)    COPD (chronic obstructive pulmonary disease) (HCC)    Diabetes mellitus without complication (HCC)    Diabetic neuropathy (HCC)    Heart murmur    in past, mild 1969   Hypertension    resolved after weight loss and more activity   Neuromuscular disorder ([place])    neuropathy   Neuropathy    Pulmonary embolism ([place])    approx 2014   Shingles '99   Sleep apnea    CPAP   Wears dentures    full upper and lower    Past Surgical History:  Procedure Laterality Date   CATARACT EXTRACTION W/PHACO Left 05/12/2019   Procedure: CATARACT EXTRACTION PHACO AND INTRAOCULAR LENS PLACEMENT ([place]) LEFTDIABETIC ISTENT INJ 2.43  00:28.6;  Surgeon: Eulogio Bear, MD;  Location: [facility];  Service: Ophthalmology;  Laterality: Left;  Diabetic - oral meds sleep apnea   CATARACT EXTRACTION W/PHACO Right 06/02/2019   Procedure: CATARACT EXTRACTION  PHACO AND INTRAOCULAR LENS PLACEMENT ([facility]) ISTENT INJ  RIGHT DIABETIC 1.31  00:25.4;  Surgeon: Eulogio Bear, MD;  Location: [facility];  Service: Ophthalmology;  Laterality: Right;   COLONOSCOPY  08/30/2015   FINGER SURGERY Right    3'rd finger, right hand   KNEE SURGERY Right    screw in knee   WRIST SURGERY Left    metal removed    There were no vitals filed for this visit.   Subjective Assessment - 03/15/21 0806     Subjective Patient has an appointment with an orthotist tomorrow. Has a cardiologist appointment tomorrow as well.    Pertinent History Pt is a 75 y.o. male referred to OPPT for LE weakness, bilat join OA, and diabetic peripheral neuropathy. PMH includes: Paroxysmal A-fib, COPD, DM Type 2, CHF, OA of bilat knees, HTN, glaucoma of both eyes. Pt reports losing weight 265 lbs to 210 lbs and this is when he noticed he started having generalized weakness and knee pain. Has received steroid injections which temporarily helped (~1 year ago). Pt having difficulty in bowling for fun, walking to complete community tasks and ADL's (requires electric buggy for shopping). Reports walking ~ 12 meters. Pt does report NO falls besides slipping on lane with bowling. Has had near falls but has used walls to catch himself.  Pt reports being on 2 L/min for COPD, denies any balance impairments. Pt's goals for PT are to return to bowling, and complete community walking tasks without having to take frequent seated rest breaks.    Limitations Lifting;Standing;Walking;House hold activities    How long can you sit comfortably? unlimited    How long can you stand comfortably? 3 minutes    How long can you walk comfortably? length of hallway in gym (10-12 m)    Patient Stated Goals Return to community ambulation.    Currently in Pain? Yes    Pain Score 2     Pain Location Knee    Pain Orientation Right;Left    Pain Descriptors / Indicators Aching    Pain Type Chronic pain    Pain Onset  More than a month ago              Patient has an appointment with an orthotist tomorrow. Has a cardiologist appointment tomorrow as well.   [facility] PT Assessment - 03/15/21 0001       Standardized Balance Assessment   Standardized Balance Assessment Berg Balance Test      Berg Balance Test   Sit to Stand Able to stand without using hands and stabilize independently    Standing Unsupported Able to stand 2 minutes with supervision    Sitting with Back Unsupported but Feet Supported on Floor or Stool Able to sit safely and securely 2 minutes    Stand to Sit Sits safely with minimal use of hands    Transfers Able to transfer safely, minor use of hands    Standing Unsupported with Eyes Closed Able to stand 10 seconds with supervision    Standing Unsupported with Feet Together Able to place feet together independently and stand for 1 minute with supervision    From Standing, Reach Forward with Outstretched Arm Can reach forward >12 cm safely (5")    From Standing Position, Pick up Object from [place] to pick up shoe safely and easily    From Standing Position, Turn to Look Behind Over each Shoulder Turn sideways only but maintains balance    Turn 360 Degrees Needs close supervision or verbal cueing    Standing Unsupported, Alternately Place Feet on Step/Stool Able to stand independently and complete 8 steps >20 seconds    Standing Unsupported, One Foot in Front Able to take small step independently and hold 30 seconds    Standing on One Leg Tries to lift leg/unable to hold 3 seconds but remains standing independently    Total Score 41            BP: 139/82   Goals:  FOTO: 56%  5xSTS: 13.35 hands on knees but shorts sliding down buttocks causing him to stop for ~2-3 seconds to pull shorts back up.  10 MWT: 11 seconds  6 MWT; deferred due to left knee buckling  BERG: 41/56      3L of oxygen via nasal cannula throughout session with vitals monitored throughout session.  SpO2  ranged between 88-100%.    Treatment:  Donning of braces: [place] braces: education on needing L brace to having padding to change from medial side to lateral side for stabilization.   Seated   Matrix cable:  UE row with dual handles 15x: #27.5; 2 sets ; second set #32.5 lb  UE lat pull down with dual handles 15x #32.5 lb; 2 sets  Hamstring curl with heel loop #7.5; 15x each LE; 2 sets very  challenging for patient with L knee pain reported   GTB: -adduction 15x each LE -IR/ER 15x each LE ; stabilization to L knee provided  Patient requested information on different orthopedic surgeons in area.   Patient's condition has the potential to improve in response to therapy. Maximum improvement is yet to be obtained. The anticipated improvement is attainable and reasonable in a generally predictable time.  Patient reports his legs still limit him      Patient's goals are limited by severe pain in L knee upon standing. Patient to see orthotist about fixing braces. Patient unable to perform 6 min walk test due to pain and buckling of L knee this session. Monitoring of oxygen required due to patient shortness of breath throughout session. Patient's condition has the potential to improve in response to therapy. Maximum improvement is yet to be obtained. The anticipated improvement is attainable and reasonable in a generally predictable time.Patient will benefit from skilled physical therapy improve patient's ambulation capacity, mobility, strength, and independence with IADLs/ADLS                   PT Education - 03/15/21 0758     Education Details goals, progress note    Person(s) Educated Patient    Methods Explanation;Demonstration;Tactile cues;Verbal cues    Comprehension Verbalized understanding;Returned demonstration;Verbal cues required;Tactile cues required              PT Short Term Goals - 02/01/21 1239       PT SHORT TERM GOAL #1   Title Pt will be independent  with HEP to improve strength, balance, endurance to improve community ADL's.    Baseline 8/2: Initiate next session 9/13: intermittent compliance 10/4: intermittent compliance    Time 4    Period Weeks    Status Partially Met    Target Date 03/01/21               PT Long Term Goals - 02/01/21 1236       PT LONG TERM GOAL #1   Title Pt will increase FOTO to target to demonstrate improved ability to complete functional mobility and ADL's.    Baseline 8/2: 48/64 9/13: 57% 10/4:  57%    Time 8    Period Weeks    Status Partially Met    Target Date 03/29/21      PT LONG TERM GOAL #2   Title Pt will improve 5xSTS time to < 10 sec to demonstrate clinically significant improvement in BLE strength.    Baseline 8/2: 13.8 sec 9/13: 12.41 seconds 10/4: 12.3 second arms crossed    Time 8    Period Weeks    Status Partially Met    Target Date 03/29/21      PT LONG TERM GOAL #3   Title Pt will improve gait speed to at least 1.0 m/s to display evidenced norms to safely complete community wlaking tasks with decreased risk of falls.    Baseline 8/2: 0.678 m/s 9/13: 0.7 m/s 10/4: 0.87 m/s    Time 8    Period Weeks    Status Partially Met    Target Date 03/29/21      PT LONG TERM GOAL #4   Title Pt will improve 6MWT to at least 1000' to demo ability to complete community distance walking tasks.    Baseline 8/2: Deferred due to BP readings. 8/4: 64 ft 9/13: 120 ft in 1 min 20 seconds 10/4: 200 ft with 2 min 58 seconds  Time 8    Period Weeks    Status Partially Met    Target Date 03/29/21      PT LONG TERM GOAL #5   Title Patient will increase Berg Balance score by > 6 points (42/56 ) to demonstrate decreased fall risk during functional activities.    Baseline 10/4: 36/56    Time 8    Period Weeks    Status New    Target Date 03/29/21                   Plan - 03/15/21 5916     Clinical Impression Statement Patient's goals are limited by severe pain in L knee upon  standing. Patient to see orthotist about fixing braces. Patient unable to perform 6 min walk test due to pain and buckling of L knee this session. Monitoring of oxygen required due to patient shortness of breath throughout session. Patient's condition has the potential to improve in response to therapy. Maximum improvement is yet to be obtained. The anticipated improvement is attainable and reasonable in a generally predictable time.Patient will benefit from skilled physical therapy improve patient's ambulation capacity, mobility, strength, and independence with IADLs/ADLS    Personal Factors and Comorbidities Time since onset of injury/illness/exacerbation;Age;Comorbidity 3+;Education;Past/Current Experience;Fitness    Comorbidities PMH includes: Paroxysmal A-fib, COPD, DM Type 2, CHF, OA of bilat knees, HTN, glaucoma of both eyes.    Examination-Activity Limitations Stairs;Squat;Stand;Locomotion Level    Examination-Participation Restrictions Community Activity    Stability/Clinical Decision Making Evolving/Moderate complexity    Rehab Potential Fair    PT Frequency 2x / week    PT Duration 8 weeks    PT Treatment/Interventions ADLs/Self Care Home Management;Cryotherapy;Gait training;Therapeutic exercise;Patient/family education;Manual techniques;Passive range of motion;Spinal Manipulations;Dry needling;Aquatic Therapy;Electrical Stimulation;Stair training;Functional mobility training;Neuromuscular re-education;Therapeutic activities;Balance training;Energy conservation;Moist Heat    PT Next Visit Plan BP, strengthening    PT Home Exercise Plan Next session    Consulted and Agree with Plan of Care Patient             Patient will benefit from skilled therapeutic intervention in order to improve the following deficits and impairments:  Abnormal gait, Improper body mechanics, Decreased mobility, Decreased activity tolerance, Decreased balance, Decreased endurance, Decreased range of motion,  Decreased strength, Difficulty walking, Decreased safety awareness, Impaired vision/preception  Visit Diagnosis: Abnormality of gait and mobility  Muscle weakness (generalized)  Difficulty in walking, not elsewhere classified     Problem List Patient Active Problem List   Diagnosis Date Noted   Bilateral primary osteoarthritis of knee 06/11/2019   Cervical radicular pain 06/11/2019   Cervical facet joint syndrome 06/11/2019   Lumbar facet arthropathy 06/11/2019   Lumbar radiculopathy 06/11/2019   Chronic pain syndrome 06/11/2019   Lung nodule 02/09/2016   Exercise hypoxemia 12/28/2015   Chronic obstructive pulmonary disease ([place]) 03/16/2015   Nuclear sclerosis 03/16/2015   OSA (obstructive sleep apnea) 03/16/2015   Primary open angle glaucoma of both eyes, severe stage 03/16/2015   Type 2 diabetes mellitus without complication, without long-term current use of insulin ([place]) 03/16/2015   Arthralgia of left lower leg 10/29/2014   Osteoarthritis of left knee 09/10/2014   Type 2 diabetes mellitus with diabetic neuropathy ([place]) 08/04/2014   Cataract, nuclear sclerotic senile 04/01/2014   Combined forms of age-related cataract 04/01/2014   Keratitis sicca, bilateral ([place]) 04/01/2014   Primary open angle glaucoma 02/27/2014   Flat feet 01/29/2014   Foot pain, bilateral 01/29/2014   Tinea pedis of both feet  01/29/2014   Ptosis of both eyelids 11/25/2013   Type 2 diabetes mellitus without retinopathy ([place]) 11/25/2013   Onychomycosis 47/15/9539   Chronic systolic heart failure ([place]) 11/11/2013   Emphysema of lung ([place]) 11/11/2013   Essential hypertension 11/11/2013   Screen for colon cancer 11/05/2013   Cardiomyopathy ([place]) 67/28/9791   Systolic HF (heart failure) ([place]) 50/41/3643   Acute systolic CHF (congestive heart failure) ([place]) 09/16/2013   COPD (chronic obstructive pulmonary disease) ([place]) 09/16/2013   HTN (hypertension) 09/16/2013   Obesity 09/16/2013   PAF  (paroxysmal atrial fibrillation) ([place]) 09/16/2013   Senile nuclear sclerosis 05/22/2013   Muscle weakness (generalized) 11/11/2009   Long term current use of anticoagulant therapy 06/17/2009   Pulmonary embolism ([place]) 06/17/2009   ED (erectile dysfunction) of organic origin 05/13/2003    Janna Arch, PT, DPT  03/15/2021, 8:48 AM  [place] [facility] [address] [place], [place], [address] Phone: [phone]   Fax:  [phone]  Name: Kit Mollett MRN: [id number] Date of Birth: 1945-08-22

## 2021-03-17 ENCOUNTER — Ambulatory Visit: Payer: Medicare Other

## 2021-03-17 ENCOUNTER — Other Ambulatory Visit: Payer: Self-pay

## 2021-03-17 DIAGNOSIS — R262 Difficulty in walking, not elsewhere classified: Secondary | ICD-10-CM

## 2021-03-17 DIAGNOSIS — R269 Unspecified abnormalities of gait and mobility: Secondary | ICD-10-CM

## 2021-03-17 DIAGNOSIS — M6281 Muscle weakness (generalized): Secondary | ICD-10-CM

## 2021-03-17 NOTE — Therapy (Signed)
[place] MAIN [facility] SERVICES [address] [place], [place], [address] Phone: [phone]   Fax:  [phone]  Physical Therapy Treatment  Patient Details  Name: Mario [name] MRN: [id number] Date of Birth: 22-Dec-1945 Referring Provider (PT): [name], Victoriano Lain, NP   Encounter Date: 03/17/2021   PT End of Session - 03/17/21 0808     Visit Number 21    Number of Visits 30    Date for PT Re-Evaluation 03/29/21    Authorization Type 1/10 PN 11/15    PT Start Time 0800    PT Stop Time 0844    PT Time Calculation (min) 44 min    Equipment Utilized During Treatment Gait belt    Activity Tolerance Patient tolerated treatment well;Treatment limited secondary to medical complications (Comment);Other (comment)   Elevated BP readings            Past Medical History:  Diagnosis Date   Arthritis    hands, knees   Atrial fibrillation (HCC)    CHF (congestive heart failure) (HCC)    COPD (chronic obstructive pulmonary disease) (HCC)    Diabetes mellitus without complication (HCC)    Diabetic neuropathy (HCC)    Heart murmur    in past, mild 1969   Hypertension    resolved after weight loss and more activity   Neuromuscular disorder ([place])    neuropathy   Neuropathy    Pulmonary embolism ([place])    approx 2014   Shingles '99   Sleep apnea    CPAP   Wears dentures    full upper and lower    Past Surgical History:  Procedure Laterality Date   CATARACT EXTRACTION W/PHACO Left 05/12/2019   Procedure: CATARACT EXTRACTION PHACO AND INTRAOCULAR LENS PLACEMENT ([place]) LEFTDIABETIC ISTENT INJ 2.43  00:28.6;  Surgeon: Eulogio Bear, MD;  Location: [place];  Service: Ophthalmology;  Laterality: Left;  Diabetic - oral meds sleep apnea   CATARACT EXTRACTION W/PHACO Right 06/02/2019   Procedure: CATARACT EXTRACTION PHACO AND INTRAOCULAR LENS PLACEMENT (IOC) ISTENT INJ  RIGHT DIABETIC 1.31  00:25.4;  Surgeon: Eulogio Bear, MD;   Location: [place];  Service: Ophthalmology;  Laterality: Right;   COLONOSCOPY  08/30/2015   FINGER SURGERY Right    3'rd finger, right hand   KNEE SURGERY Right    screw in knee   WRIST SURGERY Left    metal removed    There were no vitals filed for this visit.   Subjective Assessment - 03/17/21 0804     Subjective Patient met with physician about knee and orthotist. Was told it is just arthritis. No falls or LOB since last session. Sees cardiologist today.    Pertinent History Pt is a 75 y.o. male referred to OPPT for LE weakness, bilat join OA, and diabetic peripheral neuropathy. PMH includes: Paroxysmal A-fib, COPD, DM Type 2, CHF, OA of bilat knees, HTN, glaucoma of both eyes. Pt reports losing weight 265 lbs to 210 lbs and this is when he noticed he started having generalized weakness and knee pain. Has received steroid injections which temporarily helped (~1 year ago). Pt having difficulty in bowling for fun, walking to complete community tasks and ADL's (requires electric buggy for shopping). Reports walking ~ 12 meters. Pt does report NO falls besides slipping on lane with bowling. Has had near falls but has used walls to catch himself. Pt reports being on 2 L/min for COPD, denies any balance impairments. Pt's goals for PT  are to return to bowling, and complete community walking tasks without having to take frequent seated rest breaks.    Limitations Lifting;Standing;Walking;House hold activities    How long can you sit comfortably? unlimited    How long can you stand comfortably? 3 minutes    How long can you walk comfortably? length of hallway in gym (10-12 m)    Patient Stated Goals Return to community ambulation.    Currently in Pain? Yes    Pain Score 6     Pain Location Knee    Pain Orientation Right;Left    Pain Descriptors / Indicators Aching    Pain Type Chronic pain    Pain Onset More than a month ago                 BP at start of session 122/90         3L of oxygen via nasal cannula throughout session with vitals monitored throughout session.  SpO2 ranged between 87-100%.    Treatment:    Nustep Lvl 5; seat position 10; RPM>70 4 minutes for cardiovascular strengthening-rest breaks to increase SP02 due to Sp02 dropping to 87%    Standing with CGA next to support surface:      Precor leg press bilateral LE"s #70, requires min A for placement of feet, cues for keeping knees from hyperextension 15x ; 2 sets; stabilization band to L knee for pain reduction ;    Precor leg press single LE #40 12x each LE ; stabilization to lateral aspect of L knee for pain reduction x2 sets each LE    4lb ankle weights:  -lateral stepping 6x length of // bars; BUE support  -forward high knee march/backward high knee march 4x length of // bars -seated upright posture marching without back support 12x each LE  -seated LAQ 3 second hold 10x each LE     Pt educated throughout session about proper posture and technique with exercises. Improved exercise technique, movement at target joints, use of target muscles after min to mod verbal, visual, tactile cues.     Patient requires constant monitoring of SP02 due to dropping down to 87% frequently with exercise. Patient agreeable to call physician about changes. Patient requires increased frequency of rest breaks due to fatigue and shortness of breath. Patient has nearly doubled his single leg press weight demonstrating increased strength. Patient will benefit from skilled physical therapy improve patient's ambulation capacity, mobility, strength, and independence with IADLs/ADLS         PT Education - 03/17/21 0808     Education Details exercise technique, body mechanics    Person(s) Educated Patient    Methods Explanation;Demonstration;Tactile cues;Verbal cues    Comprehension Verbalized understanding;Returned demonstration;Verbal cues required;Tactile cues required              PT  Short Term Goals - 02/01/21 1239       PT SHORT TERM GOAL #1   Title Pt will be independent with HEP to improve strength, balance, endurance to improve community ADL's.    Baseline 8/2: Initiate next session 9/13: intermittent compliance 10/4: intermittent compliance    Time 4    Period Weeks    Status Partially Met    Target Date 03/01/21               PT Long Term Goals - 02/01/21 1236       PT LONG TERM GOAL #1   Title Pt will increase FOTO to target to  demonstrate improved ability to complete functional mobility and ADL's.    Baseline 8/2: 48/64 9/13: 57% 10/4:  57%    Time 8    Period Weeks    Status Partially Met    Target Date 03/29/21      PT LONG TERM GOAL #2   Title Pt will improve 5xSTS time to < 10 sec to demonstrate clinically significant improvement in BLE strength.    Baseline 8/2: 13.8 sec 9/13: 12.41 seconds 10/4: 12.3 second arms crossed    Time 8    Period Weeks    Status Partially Met    Target Date 03/29/21      PT LONG TERM GOAL #3   Title Pt will improve gait speed to at least 1.0 m/s to display evidenced norms to safely complete community wlaking tasks with decreased risk of falls.    Baseline 8/2: 0.678 m/s 9/13: 0.7 m/s 10/4: 0.87 m/s    Time 8    Period Weeks    Status Partially Met    Target Date 03/29/21      PT LONG TERM GOAL #4   Title Pt will improve 6MWT to at least 1000' to demo ability to complete community distance walking tasks.    Baseline 8/2: Deferred due to BP readings. 8/4: 64 ft 9/13: 120 ft in 1 min 20 seconds 10/4: 200 ft with 2 min 58 seconds    Time 8    Period Weeks    Status Partially Met    Target Date 03/29/21      PT LONG TERM GOAL #5   Title Patient will increase Berg Balance score by > 6 points (42/56 ) to demonstrate decreased fall risk during functional activities.    Baseline 10/4: 36/56    Time 8    Period Weeks    Status New    Target Date 03/29/21                   Plan - 03/17/21 7939      Clinical Impression Statement Patient requires constant monitoring of SP02 due to dropping down to 87% frequently with exercise. Patient agreeable to call physician about changes. Patient requires increased frequency of rest breaks due to fatigue and shortness of breath. Patient has nearly doubled his single leg press weight demonstrating increased strength. Patient will benefit from skilled physical therapy improve patient's ambulation capacity, mobility, strength, and independence with IADLs/ADLS    Personal Factors and Comorbidities Time since onset of injury/illness/exacerbation;Age;Comorbidity 3+;Education;Past/Current Experience;Fitness    Comorbidities PMH includes: Paroxysmal A-fib, COPD, DM Type 2, CHF, OA of bilat knees, HTN, glaucoma of both eyes.    Examination-Activity Limitations Stairs;Squat;Stand;Locomotion Level    Examination-Participation Restrictions Community Activity    Stability/Clinical Decision Making Evolving/Moderate complexity    Rehab Potential Fair    PT Frequency 2x / week    PT Duration 8 weeks    PT Treatment/Interventions ADLs/Self Care Home Management;Cryotherapy;Gait training;Therapeutic exercise;Patient/family education;Manual techniques;Passive range of motion;Spinal Manipulations;Dry needling;Aquatic Therapy;Electrical Stimulation;Stair training;Functional mobility training;Neuromuscular re-education;Therapeutic activities;Balance training;Energy conservation;Moist Heat    PT Next Visit Plan BP, strengthening    PT Home Exercise Plan Next session    Consulted and Agree with Plan of Care Patient             Patient will benefit from skilled therapeutic intervention in order to improve the following deficits and impairments:  Abnormal gait, Improper body mechanics, Decreased mobility, Decreased activity tolerance, Decreased balance, Decreased endurance, Decreased range of  motion, Decreased strength, Difficulty walking, Decreased safety awareness, Impaired  vision/preception  Visit Diagnosis: Abnormality of gait and mobility  Muscle weakness (generalized)  Difficulty in walking, not elsewhere classified     Problem List Patient Active Problem List   Diagnosis Date Noted   Bilateral primary osteoarthritis of knee 06/11/2019   Cervical radicular pain 06/11/2019   Cervical facet joint syndrome 06/11/2019   Lumbar facet arthropathy 06/11/2019   Lumbar radiculopathy 06/11/2019   Chronic pain syndrome 06/11/2019   Lung nodule 02/09/2016   Exercise hypoxemia 12/28/2015   Chronic obstructive pulmonary disease ([place]) 03/16/2015   Nuclear sclerosis 03/16/2015   OSA (obstructive sleep apnea) 03/16/2015   Primary open angle glaucoma of both eyes, severe stage 03/16/2015   Type 2 diabetes mellitus without complication, without long-term current use of insulin ([place]) 03/16/2015   Arthralgia of left lower leg 10/29/2014   Osteoarthritis of left knee 09/10/2014   Type 2 diabetes mellitus with diabetic neuropathy ([place]) 08/04/2014   Cataract, nuclear sclerotic senile 04/01/2014   Combined forms of age-related cataract 04/01/2014   Keratitis sicca, bilateral ([place]) 04/01/2014   Primary open angle glaucoma 02/27/2014   Flat feet 01/29/2014   Foot pain, bilateral 01/29/2014   Tinea pedis of both feet 01/29/2014   Ptosis of both eyelids 11/25/2013   Type 2 diabetes mellitus without retinopathy ([place]) 11/25/2013   Onychomycosis 35/82/5189   Chronic systolic heart failure ([place]) 11/11/2013   Emphysema of lung ([place]) 11/11/2013   Essential hypertension 11/11/2013   Screen for colon cancer 11/05/2013   Cardiomyopathy ([place]) 84/21/0312   Systolic HF (heart failure) ([place]) 81/18/8677   Acute systolic CHF (congestive heart failure) ([place]) 09/16/2013   COPD (chronic obstructive pulmonary disease) ([place]) 09/16/2013   HTN (hypertension) 09/16/2013   Obesity 09/16/2013   PAF (paroxysmal atrial fibrillation) ([place]) 09/16/2013   Senile nuclear sclerosis  05/22/2013   Muscle weakness (generalized) 11/11/2009   Long term current use of anticoagulant therapy 06/17/2009   Pulmonary embolism ([place]) 06/17/2009   ED (erectile dysfunction) of organic origin 05/13/2003  Janna Arch, PT, DPT  03/17/2021, 8:44 AM  [place] MAIN [facility] SERVICES [address] [place], [place], [address] Phone: [phone]   Fax:  [phone]  Name: Mario [name] MRN: [id number] Date of Birth: [birth year]

## 2021-03-22 ENCOUNTER — Ambulatory Visit: Payer: Medicare Other

## 2021-03-22 ENCOUNTER — Other Ambulatory Visit: Payer: Self-pay

## 2021-03-22 DIAGNOSIS — M6281 Muscle weakness (generalized): Secondary | ICD-10-CM

## 2021-03-22 DIAGNOSIS — R269 Unspecified abnormalities of gait and mobility: Secondary | ICD-10-CM

## 2021-03-22 DIAGNOSIS — R262 Difficulty in walking, not elsewhere classified: Secondary | ICD-10-CM

## 2021-03-22 NOTE — Therapy (Signed)
[place] MAIN [facility] SERVICES [address] [place], [place], [address] Phone: [phone]   Fax:  [phone]  Physical Therapy Treatment  Patient Details  Name: Mario Mario [name] MRN: [id number] Date of Birth: 05/25/1945 Referring Provider (PT): [name], Victoriano Lain, NP   Encounter Date: 03/22/2021   PT End of Session - 03/22/21 0806     Visit Number 22    Number of Visits 30    Date for PT Re-Evaluation 03/29/21    Authorization Type 2/10 PN 11/15    PT Start Time 0759    PT Stop Time 0844    PT Time Calculation (min) 45 min    Equipment Utilized During Treatment Gait belt    Activity Tolerance Patient tolerated treatment well;Treatment limited secondary to medical complications (Comment);Other (comment)   Elevated BP readings            Past Medical History:  Diagnosis Date   Arthritis    Mario [name], knees   Atrial fibrillation (HCC)    CHF (congestive heart failure) (HCC)    COPD (chronic obstructive pulmonary disease) (HCC)    Diabetes mellitus without complication (HCC)    Diabetic neuropathy (HCC)    Heart murmur    in past, mild 1969   Hypertension    resolved after weight loss and more activity   Neuromuscular disorder ([place])    neuropathy   Neuropathy    Pulmonary embolism ([place])    approx 2014   Shingles '99   Sleep apnea    CPAP   Wears dentures    full upper and lower    Past Surgical History:  Procedure Laterality Date   CATARACT EXTRACTION W/PHACO Left 05/12/2019   Procedure: CATARACT EXTRACTION PHACO AND INTRAOCULAR LENS PLACEMENT ([place]) LEFTDIABETIC ISTENT INJ 2.43  00:28.6;  Surgeon: Eulogio Bear, MD;  Location: [place];  Service: Ophthalmology;  Laterality: Left;  Diabetic - oral meds sleep apnea   CATARACT EXTRACTION W/PHACO Right 06/02/2019   Procedure: CATARACT EXTRACTION PHACO AND INTRAOCULAR LENS PLACEMENT ([facility]) ISTENT INJ  RIGHT DIABETIC 1.31  00:25.4;  Surgeon: Eulogio Bear, MD;   Location: [place];  Service: Ophthalmology;  Laterality: Right;   COLONOSCOPY  08/30/2015   FINGER SURGERY Right    3'rd finger, right hand   KNEE SURGERY Right    screw in knee   WRIST SURGERY Left    metal removed    There were no vitals filed for this visit.   Subjective Assessment - 03/22/21 0805     Subjective Patient reports no falls or LOB since last session. Phone broke over the weekend. Remembered his long oxygen cord today. .    Pertinent History Pt is a 75 y.o. male referred to OPPT for LE weakness, bilat join OA, and diabetic peripheral neuropathy. PMH includes: Paroxysmal A-fib, COPD, DM Type 2, CHF, OA of bilat knees, HTN, glaucoma of both eyes. Pt reports losing weight 265 lbs to 210 lbs and this is when he noticed he started having generalized weakness and knee pain. Has received steroid injections which temporarily helped (~1 year ago). Pt having difficulty in bowling for fun, walking to complete community tasks and ADL's (requires electric buggy for shopping). Reports walking ~ 12 meters. Pt does report NO falls besides slipping on lane with bowling. Has had near falls but has used walls to catch himself. Pt reports being on 2 L/min for COPD, denies any balance impairments. Pt's goals for PT are to return  to bowling, and complete community walking tasks without having to take frequent seated rest breaks.    Limitations Lifting;Standing;Walking;House hold activities    How long can you sit comfortably? unlimited    How long can you stand comfortably? 3 minutes    How long can you walk comfortably? length of hallway in gym (10-12 m)    Patient Stated Goals Return to community ambulation.    Currently in Pain? Yes    Pain Score 3     Pain Location Knee    Pain Orientation Right;Left    Pain Descriptors / Indicators Aching    Pain Type Chronic pain    Pain Onset More than a month ago    Pain Frequency Intermittent                  BP 134/85  3L of  oxygen via nasal cannula throughout session with vitals monitored throughout session.  SpO2 ranged between 87-100%.    Treatment:     Nustep Lvl 5; seat position 9; RPM>70 4 minutes for cardiovascular strengthening-SP02 drop to 89%   Standing with CGA next to support surface:     ambulate 160 ft with CGA and pushing oxygen. Monitoring of Sp02 with cues for safety and sequencing.    Precor leg press bilateral LE"s #70, requires min A for placement of feet, cues for keeping knees from hyperextension 15x ; 2 sets; stabilization band to L knee for pain reduction ; second set #85   Precor leg press single LE #40 12x each LE ; stabilization to lateral aspect of L knee for pain reduction x2 sets each LE    4lb ankle weights:  -6" step toe taps 10x each LE -6" step up/down 10x each LE heavy BUE support -6" step lateral step up 10x each LE heavy BUE support ; very tiring per patient report  -seated upright posture marching without back support 10x each LE ; 2 sets  -seated LAQ 3 second hold 10x each LE       Pt educated throughout session about proper posture and technique with exercises. Improved exercise technique, movement at target joints, use of target muscles after min to mod verbal, visual, tactile cues.         Patient is motivated throughout physical therapy session. He has pain in bilateral knees with full extension. Patient tolerates the increased weight for strengthening interventions. He has decreased pain with stabilization to lateral aspect of L knee with interventions. Patient will benefit from skilled physical therapy improve patient's ambulation capacity, mobility, strength, and independence with IADLs/ADLS                PT Education - 03/22/21 0806     Education Details exercise technique, body mechanics    Person(s) Educated Patient    Methods Explanation;Demonstration;Tactile cues;Verbal cues    Comprehension Verbalized understanding;Returned  demonstration;Verbal cues required;Tactile cues required              PT Short Term Goals - 02/01/21 1239       PT SHORT TERM GOAL #1   Title Pt will be independent with HEP to improve strength, balance, endurance to improve community ADL's.    Baseline 8/2: Initiate next session 9/13: intermittent compliance 10/4: intermittent compliance    Time 4    Period Weeks    Status Partially Met    Target Date 03/01/21               PT Long  Term Goals - 02/01/21 1236       PT LONG TERM GOAL #1   Title Pt will increase FOTO to target to demonstrate improved ability to complete functional mobility and ADL's.    Baseline 8/2: 48/64 9/13: 57% 10/4:  57%    Time 8    Period Weeks    Status Partially Met    Target Date 03/29/21      PT LONG TERM GOAL #2   Title Pt will improve 5xSTS time to < 10 sec to demonstrate clinically significant improvement in BLE strength.    Baseline 8/2: 13.8 sec 9/13: 12.41 seconds 10/4: 12.3 second arms crossed    Time 8    Period Weeks    Status Partially Met    Target Date 03/29/21      PT LONG TERM GOAL #3   Title Pt will improve gait speed to at least 1.0 m/s to display evidenced norms to safely complete community wlaking tasks with decreased risk of falls.    Baseline 8/2: 0.678 m/s 9/13: 0.7 m/s 10/4: 0.87 m/s    Time 8    Period Weeks    Status Partially Met    Target Date 03/29/21      PT LONG TERM GOAL #4   Title Pt will improve 6MWT to at least 1000' to demo ability to complete community distance walking tasks.    Baseline 8/2: Deferred due to BP readings. 8/4: 64 ft 9/13: 120 ft in 1 min 20 seconds 10/4: 200 ft with 2 min 58 seconds    Time 8    Period Weeks    Status Partially Met    Target Date 03/29/21      PT LONG TERM GOAL #5   Title Patient will increase Berg Balance score by > 6 points (42/56 ) to demonstrate decreased fall risk during functional activities.    Baseline 10/4: 36/56    Time 8    Period Weeks    Status  New    Target Date 03/29/21                   Plan - 03/22/21 0846     Clinical Impression Statement Patient is motivated throughout physical therapy session. He has pain in bilateral knees with full extension. Patient tolerates the increased weight for strengthening interventions. He has decreased pain with stabilization to lateral aspect of L knee with interventions. Patient will benefit from skilled physical therapy improve patient's ambulation capacity, mobility, strength, and independence with IADLs/ADLS    Personal Factors and Comorbidities Time since onset of injury/illness/exacerbation;Age;Comorbidity 3+;Education;Past/Current Experience;Fitness    Comorbidities PMH includes: Paroxysmal A-fib, COPD, DM Type 2, CHF, OA of bilat knees, HTN, glaucoma of both eyes.    Examination-Activity Limitations Stairs;Squat;Stand;Locomotion Level    Examination-Participation Restrictions Community Activity    Stability/Clinical Decision Making Evolving/Moderate complexity    Rehab Potential Fair    PT Frequency 2x / week    PT Duration 8 weeks    PT Treatment/Interventions ADLs/Self Care Home Management;Cryotherapy;Gait training;Therapeutic exercise;Patient/family education;Manual techniques;Passive range of motion;Spinal Manipulations;Dry needling;Aquatic Therapy;Electrical Stimulation;Stair training;Functional mobility training;Neuromuscular re-education;Therapeutic activities;Balance training;Energy conservation;Moist Heat    PT Next Visit Plan BP, strengthening    PT Home Exercise Plan Next session    Consulted and Agree with Plan of Care Patient             Patient will benefit from skilled therapeutic intervention in order to improve the following deficits and impairments:  Abnormal  gait, Improper body mechanics, Decreased mobility, Decreased activity tolerance, Decreased balance, Decreased endurance, Decreased range of motion, Decreased strength, Difficulty walking, Decreased  safety awareness, Impaired vision/preception  Visit Diagnosis: Abnormality of gait and mobility  Muscle weakness (generalized)  Difficulty in walking, not elsewhere classified     Problem List Patient Active Problem List   Diagnosis Date Noted   Bilateral primary osteoarthritis of knee 06/11/2019   Cervical radicular pain 06/11/2019   Cervical facet joint syndrome 06/11/2019   Lumbar facet arthropathy 06/11/2019   Lumbar radiculopathy 06/11/2019   Chronic pain syndrome 06/11/2019   Lung nodule 02/09/2016   Exercise hypoxemia 12/28/2015   Chronic obstructive pulmonary disease ([place]) 03/16/2015   Nuclear sclerosis 03/16/2015   OSA (obstructive sleep apnea) 03/16/2015   Primary open angle glaucoma of both eyes, severe stage 03/16/2015   Type 2 diabetes mellitus without complication, without long-term current use of insulin ([place]) 03/16/2015   Arthralgia of left lower leg 10/29/2014   Osteoarthritis of left knee 09/10/2014   Type 2 diabetes mellitus with diabetic neuropathy ([place]) 08/04/2014   Cataract, nuclear sclerotic senile 04/01/2014   Combined forms of age-related cataract 04/01/2014   Keratitis sicca, bilateral ([place]) 04/01/2014   Primary open angle glaucoma 02/27/2014   Flat feet 01/29/2014   Foot pain, bilateral 01/29/2014   Tinea pedis of both feet 01/29/2014   Ptosis of both eyelids 11/25/2013   Type 2 diabetes mellitus without retinopathy ([place]) 11/25/2013   Onychomycosis 40/98/1191   Chronic systolic heart failure ([place]) 11/11/2013   Emphysema of lung ([place]) 11/11/2013   Essential hypertension 11/11/2013   Screen for colon cancer 11/05/2013   Cardiomyopathy ([place]) 47/82/9562   Systolic HF (heart failure) ([place]) 13/11/6576   Acute systolic CHF (congestive heart failure) ([place]) 09/16/2013   COPD (chronic obstructive pulmonary disease) ([place]) 09/16/2013   HTN (hypertension) 09/16/2013   Obesity 09/16/2013   PAF (paroxysmal atrial fibrillation) ([place]) 09/16/2013   Senile  nuclear sclerosis 05/22/2013   Muscle weakness (generalized) 11/11/2009   Long term current use of anticoagulant therapy 06/17/2009   Pulmonary embolism ([place]) 06/17/2009   ED (erectile dysfunction) of organic origin 05/13/2003   Janna Arch, PT, DPT  03/22/2021, 8:46 AM  [place] MAIN [facility] SERVICES [address] [place], [place], [address] Phone: [phone]   Fax:  [phone]  Name: Mario Mario [name] MRN: [id number] Date of Birth: November 23, 1945

## 2021-03-29 ENCOUNTER — Ambulatory Visit: Payer: Medicare Other

## 2021-03-29 ENCOUNTER — Other Ambulatory Visit: Payer: Self-pay

## 2021-03-29 DIAGNOSIS — R269 Unspecified abnormalities of gait and mobility: Secondary | ICD-10-CM

## 2021-03-29 DIAGNOSIS — R262 Difficulty in walking, not elsewhere classified: Secondary | ICD-10-CM

## 2021-03-29 DIAGNOSIS — M6281 Muscle weakness (generalized): Secondary | ICD-10-CM

## 2021-03-29 NOTE — Therapy (Signed)
[place] MAIN [facility] SERVICES [address] [place], [place], [address] Phone: [phone]   Fax:  [phone]  Physical Therapy Treatment/ [facility]ERT  Patient Details  Name: Mario [name] MRN: [id number] Date of Birth: 1945-09-12 Referring Provider (PT): [name], Victoriano Lain, NP   Encounter Date: 03/29/2021   PT End of Session - 03/29/21 0854     Visit Number 23    Number of Visits 39    Date for PT Re-Evaluation 05/24/21    Authorization Type 3/10 PN 11/15    PT Start Time 0800    PT Stop Time 0845    PT Time Calculation (min) 45 min    Equipment Utilized During Treatment Gait belt    Activity Tolerance Patient tolerated treatment well;Treatment limited secondary to medical complications (Comment);Other (comment)   Elevated BP readings            Past Medical History:  Diagnosis Date   Arthritis    hands, knees   Atrial fibrillation (HCC)    CHF (congestive heart failure) (HCC)    COPD (chronic obstructive pulmonary disease) (HCC)    Diabetes mellitus without complication (HCC)    Diabetic neuropathy (HCC)    Heart murmur    in past, mild 1969   Hypertension    resolved after weight loss and more activity   Neuromuscular disorder ([place])    neuropathy   Neuropathy    Pulmonary embolism ([place])    approx 2014   Shingles '99   Sleep apnea    CPAP   Wears dentures    full upper and lower    Past Surgical History:  Procedure Laterality Date   CATARACT EXTRACTION W/PHACO Left 05/12/2019   Procedure: CATARACT EXTRACTION PHACO AND INTRAOCULAR LENS PLACEMENT ([place]) LEFTDIABETIC ISTENT INJ 2.43  00:28.6;  Surgeon: Eulogio Bear, MD;  Location: [place];  Service: Ophthalmology;  Laterality: Left;  Diabetic - oral meds sleep apnea   CATARACT EXTRACTION W/PHACO Right 06/02/2019   Procedure: CATARACT EXTRACTION PHACO AND INTRAOCULAR LENS PLACEMENT ([facility]) ISTENT INJ  RIGHT DIABETIC 1.31  00:25.4;  Surgeon: Eulogio Bear, MD;  Location: [place];  Service: Ophthalmology;  Laterality: Right;   COLONOSCOPY  08/30/2015   FINGER SURGERY Right    3'rd finger, right hand   KNEE SURGERY Right    screw in knee   WRIST SURGERY Left    metal removed    There were no vitals filed for this visit.   Subjective Assessment - 03/29/21 0809     Subjective Patient reports his L knee pain continues to limit his mobility. Has noticed an improvement in his R knee though.    Pertinent History Pt is a 75 y.o. male referred to OPPT for LE weakness, bilat join OA, and diabetic peripheral neuropathy. PMH includes: Paroxysmal A-fib, COPD, DM Type 2, CHF, OA of bilat knees, HTN, glaucoma of both eyes. Pt reports losing weight 265 lbs to 210 lbs and this is when he noticed he started having generalized weakness and knee pain. Has received steroid injections which temporarily helped (~1 year ago). Pt having difficulty in bowling for fun, walking to complete community tasks and ADL's (requires electric buggy for shopping). Reports walking ~ 12 meters. Pt does report NO falls besides slipping on lane with bowling. Has had near falls but has used walls to catch himself. Pt reports being on 2 L/min for COPD, denies any balance impairments. Pt's goals for PT are to return  to bowling, and complete community walking tasks without having to take frequent seated rest breaks.    Limitations Lifting;Standing;Walking;House hold activities    How long can you sit comfortably? unlimited    How long can you stand comfortably? 3 minutes    How long can you walk comfortably? length of hallway in gym (10-12 m)    Patient Stated Goals Return to community ambulation.    Currently in Pain? Yes    Pain Score 4     Pain Location Knee    Pain Orientation Left    Pain Descriptors / Indicators Aching    Pain Type Chronic pain    Pain Onset More than a month ago    Pain Frequency Intermittent                [facility] PT Assessment -  03/29/21 0001       Standardized Balance Assessment   Standardized Balance Assessment Berg Balance Test      Berg Balance Test   Sit to Stand Able to stand without using hands and stabilize independently    Standing Unsupported Able to stand safely 2 minutes    Sitting with Back Unsupported but Feet Supported on Floor or Stool Able to sit safely and securely 2 minutes    Stand to Sit Sits safely with minimal use of hands    Transfers Able to transfer safely, minor use of hands    Standing Unsupported with Eyes Closed Able to stand 10 seconds with supervision    Standing Unsupported with Feet Together Able to place feet together independently and stand for 1 minute with supervision    From Standing, Reach Forward with Outstretched Arm Can reach forward >12 cm safely (5")    From Standing Position, Pick up Object from [place] to pick up shoe safely and easily    From Standing Position, Turn to Look Behind Over each Shoulder Looks behind one side only/other side shows less weight shift    Turn 360 Degrees Able to turn 360 degrees safely but slowly    Standing Unsupported, Alternately Place Feet on Step/Stool Able to stand independently and complete 8 steps >20 seconds    Standing Unsupported, One Foot in Front Able to take small step independently and hold 30 seconds    Standing on One Leg Tries to lift leg/unable to hold 3 seconds but remains standing independently    Total Score 44             Patient reports his L knee pain continues to limit his mobility. Has noticed an improvement in his R knee though.   BP at start of session: 134/84   On 3L of oxygen via nasal cannula  RECERT FOTO 01% 5xSTS: 12.08 seconds first trial; second trial 9.7 seconds  10 MWT 10.6 seconds ; knee pain went up to 8/10 =0.94 m/s   6 MWT: 265 in 3 minutes and 3 seconds BERG: 44/56    new goal: decreased pain in L knee with ambulation and mobility   Access Code: [email or web address] URL:  [email or web address] Date: 03/29/2021 Prepared by: Janna Arch  Program Notes **Be sure to perform all exercises next to a countertop or sturdy piece of furniture in case you become unsteady.   Exercises Standing Heel Raise with Support - 1 x daily - 7 x weekly - 2 sets - 15 reps Standing Toe Raises at Chair - 1 x daily - 7 x weekly - 2 sets - 15 reps Mini  Squat with Counter Support - 1 x daily - 7 x weekly - 2 sets - 15 reps Lunge with Counter Support - 1 x daily - 7 x weekly - 2 sets - 15 reps Standing Tandem Balance with Counter Support - 1 x daily - 7 x weekly - 2 sets - 2 reps - 30 hold Standing Single Leg Stance with Counter Support - 1 x daily - 7 x weekly - 2 sets - 2 reps - 30 hold   Pt educated throughout session about proper posture and technique with exercises. Improved exercise technique, movement at target joints, use of target muscles after min to mod verbal, visual, tactile cues.  Patient has met his 5x STS goal as well as BERG goal. His 10 MWT has improved significantly however knee pain continues to be present with all ambulation. His 6 MWT has improved in capacity of ambulation performed however knee pain does limit test with 10/10 pain. Progression of BERG goal as well as new goal addressing L knee pain added to POC. Patient will benefit from skilled physical therapy improve patient's ambulation capacity, mobility, strength, and independence with IADLs/ADLS  PT Education - 03/29/21 0824     Education Details goals, recert    Person(s) Educated Patient    Methods Explanation;Demonstration;Tactile cues;Verbal cues    Comprehension Verbalized understanding;Returned demonstration;Verbal cues required;Tactile cues required              PT Short Term Goals - 03/29/21 0855       PT SHORT TERM GOAL #1   Title Pt will be independent with HEP to improve strength, balance, endurance to improve community ADL's.    Baseline 8/2: Initiate next session 9/13: intermittent compliance 10/4: intermittent compliance 11/29: HEP compliance, new HEP given    Time 4    Period Weeks    Status Partially Met    Target Date 04/26/21               PT Long Term Goals - 03/29/21 0856       PT LONG TERM GOAL #1   Title Pt will increase FOTO to target to demonstrate improved ability to complete functional mobility and ADL's.    Baseline 8/2: 48/64 9/13: 57% 10/4:  57% 11/29: 51%    Time 8    Period Weeks    Status Partially Met    Target Date 05/24/21      PT LONG TERM GOAL #2   Title Pt will improve 5xSTS time to < 10 sec to demonstrate clinically significant improvement in BLE strength.    Baseline 8/2: 13.8 sec 9/13: 12.41 seconds 10/4: 12.3 second arms crossed 11/29: 9.7 seconds without UE support    Time 8    Period Weeks    Status Achieved      PT LONG TERM GOAL #3   Title Pt will improve gait speed to at least 1.0 m/s to display evidenced norms to safely complete community wlaking tasks with decreased risk of falls.    Baseline 8/2: 0.678 m/s 9/13: 0.7 m/s 10/4: 0.87 m/s  11/29: 0.94 m/s with 8/10 knee pain    Time 8    Period Weeks    Status Partially Met    Target Date 05/24/21      PT LONG TERM  GOAL #4   Title Pt will improve 6MWT to at least 1000' to demo ability to complete community distance walking tasks.    Baseline 8/2: Deferred due to BP readings. 8/4: 64 ft 9/13: 120 ft in 1 min 20 seconds 10/4: 200 ft with 2 min 58 seconds 11/29: 265 ft in 3 minutes and 3 seconds with 10/10 pain    Time 8    Period Weeks    Status Partially Met    Target Date 05/24/21      PT LONG TERM GOAL #5   Title Patient will increase Berg Balance score by > 6 points (42/56 ) to demonstrate decreased fall risk during functional activities.    Baseline 10/4: 36/56 11/29: 44/56    Time 8    Period Weeks    Status Achieved      Additional Long Term Goals   Additional Long Term Goals Yes      PT LONG TERM GOAL #6   Title Patient will increase Berg Balance score by > 6 points (50/56 ) to demonstrate decreased fall risk during functional activities.    Baseline 11/29: 44/56    Time 8    Period Weeks    Status New    Target Date 05/24/21      PT LONG TERM GOAL #7   Title  Patient will report less than 7/10 pain in L knee with ambulation improving quality of life and reduced pain mobility.    Baseline 11/29; 10/10 pain with prolonged ambulation    Time 8    Period Weeks    Status New    Target Date 05/24/21                   Plan - 03/29/21 0902     Clinical Impression Statement Patient has met his 5x STS goal as well as BERG goal. His 10 MWT has improved significantly however knee pain continues to be present with all ambulation. His 6 MWT has improved in capacity of ambulation performed however knee pain does limit test with 10/10 pain. Progression of BERG goal as well as new goal addressing L knee pain added to POC. Overall patient is improving in functional mobility indicating therapy is helping quality of life and safety with reducing risk of LOB. Patient will benefit from skilled physical therapy improve patient's ambulation capacity, mobility, strength, and independence with  IADLs/ADLS    Personal Factors and Comorbidities Time since onset of injury/illness/exacerbation;Age;Comorbidity 3+;Education;Past/Current Experience;Fitness    Comorbidities PMH includes: Paroxysmal A-fib, COPD, DM Type 2, CHF, OA of bilat knees, HTN, glaucoma of both eyes.    Examination-Activity Limitations Stairs;Squat;Stand;Locomotion Level    Examination-Participation Restrictions Community Activity    Stability/Clinical Decision Making Evolving/Moderate complexity    Rehab Potential Fair    PT Frequency 2x / week    PT Duration 8 weeks    PT Treatment/Interventions ADLs/Self Care Home Management;Cryotherapy;Gait training;Therapeutic exercise;Patient/family education;Manual techniques;Passive range of motion;Spinal Manipulations;Dry needling;Aquatic Therapy;Electrical Stimulation;Stair training;Functional mobility training;Neuromuscular re-education;Therapeutic activities;Balance training;Energy conservation;Moist Heat    PT Next Visit Plan BP, strengthening    PT Home Exercise Plan Next session    Consulted and Agree with Plan of Care Patient             Patient will benefit from skilled therapeutic intervention in order to improve the following deficits and impairments:  Abnormal gait, Improper body mechanics, Decreased mobility, Decreased activity tolerance, Decreased balance, Decreased endurance, Decreased range of motion, Decreased strength, Difficulty walking, Decreased safety awareness, Impaired vision/preception  Visit Diagnosis: Abnormality of gait and mobility  Muscle weakness (generalized)  Difficulty in walking, not elsewhere classified     Problem List Patient Active Problem List   Diagnosis Date Noted   Bilateral primary osteoarthritis of knee 06/11/2019   Cervical radicular pain 06/11/2019   Cervical facet joint syndrome 06/11/2019   Lumbar facet arthropathy 06/11/2019   Lumbar radiculopathy 06/11/2019   Chronic pain syndrome 06/11/2019   Lung nodule  02/09/2016   Exercise hypoxemia 12/28/2015   Chronic obstructive pulmonary disease ([place]) 03/16/2015   Nuclear sclerosis 03/16/2015   OSA (obstructive sleep apnea) 03/16/2015   Primary open angle glaucoma of both eyes, severe stage 03/16/2015   Type 2 diabetes mellitus without complication, without long-term current use of insulin ([place]) 03/16/2015   Arthralgia of left lower leg 10/29/2014   Osteoarthritis of left knee 09/10/2014   Type 2 diabetes mellitus with diabetic neuropathy ([place]) 08/04/2014   Cataract, nuclear sclerotic senile 04/01/2014   Combined forms of age-related cataract 04/01/2014   Keratitis sicca, bilateral ([place]) 04/01/2014   Primary open angle glaucoma 02/27/2014   Flat feet 01/29/2014   Foot pain, bilateral 01/29/2014   Tinea pedis of both feet 01/29/2014   Ptosis of both eyelids 11/25/2013   Type 2 diabetes mellitus without retinopathy ([place]) 11/25/2013   Onychomycosis 11/19/2013  Chronic systolic heart failure ([place]) 11/11/2013   Emphysema of lung ([place]) 11/11/2013   Essential hypertension 11/11/2013   Screen for colon cancer 11/05/2013   Cardiomyopathy ([place]) 39/79/5369   Systolic HF (heart failure) ([place]) 22/30/0979   Acute systolic CHF (congestive heart failure) ([place]) 09/16/2013   COPD (chronic obstructive pulmonary disease) ([place]) 09/16/2013   HTN (hypertension) 09/16/2013   Obesity 09/16/2013   PAF (paroxysmal atrial fibrillation) ([place]) 09/16/2013   Senile nuclear sclerosis 05/22/2013   Muscle weakness (generalized) 11/11/2009   Long term current use of anticoagulant therapy 06/17/2009   Pulmonary embolism ([place]) 06/17/2009   ED (erectile dysfunction) of organic origin 05/13/2003    Janna Arch, PT, DPT  03/29/2021, 9:11 AM  [place] MAIN [facility] SERVICES [address] [place], [place], [address] Phone: [phone]   Fax:  [phone]  Name: Mario [name] MRN: [id number] Date of Birth: 27-Oct-1945

## 2021-03-31 ENCOUNTER — Other Ambulatory Visit: Payer: Self-pay

## 2021-03-31 ENCOUNTER — Ambulatory Visit: Payer: Medicare Other | Attending: Nurse Practitioner

## 2021-03-31 DIAGNOSIS — R262 Difficulty in walking, not elsewhere classified: Secondary | ICD-10-CM | POA: Insufficient documentation

## 2021-03-31 DIAGNOSIS — R269 Unspecified abnormalities of gait and mobility: Secondary | ICD-10-CM | POA: Insufficient documentation

## 2021-03-31 DIAGNOSIS — M6281 Muscle weakness (generalized): Secondary | ICD-10-CM | POA: Diagnosis present

## 2021-03-31 NOTE — Therapy (Signed)
[place] MAIN [facility] SERVICES [address] [place], [place], [address] Phone: [phone]   Fax:  [phone]  Physical Therapy Treatment  Patient Details  Name: Mario [name] MRN: [id number] Date of Birth: [birth year] Referring Provider (PT): [name], Victoriano Lain, NP   Encounter Date: 03/31/2021   PT End of Session - 03/31/21 0809     Visit Number 24    Number of Visits 39    Date for PT Re-Evaluation 05/24/21    Authorization Type 4/10 PN 11/15    PT Start Time 0800    PT Stop Time 0844    PT Time Calculation (min) 44 min    Equipment Utilized During Treatment Gait belt    Activity Tolerance Patient tolerated treatment well;Treatment limited secondary to medical complications (Comment);Other (comment)   Elevated BP readings            Past Medical History:  Diagnosis Date   Arthritis    hands, knees   Atrial fibrillation (HCC)    CHF (congestive heart failure) (HCC)    COPD (chronic obstructive pulmonary disease) (HCC)    Diabetes mellitus without complication (HCC)    Diabetic neuropathy (HCC)    Heart murmur    in past, mild 1969   Hypertension    resolved after weight loss and more activity   Neuromuscular disorder ([place])    neuropathy   Neuropathy    Pulmonary embolism ([place])    approx 2014   Shingles '99   Sleep apnea    CPAP   Wears dentures    full upper and lower    Past Surgical History:  Procedure Laterality Date   CATARACT EXTRACTION W/PHACO Left 05/12/2019   Procedure: CATARACT EXTRACTION PHACO AND INTRAOCULAR LENS PLACEMENT ([place]) LEFTDIABETIC ISTENT INJ 2.43  00:28.6;  Surgeon: Eulogio Bear, MD;  Location: [facility];  Service: Ophthalmology;  Laterality: Left;  Diabetic - oral meds sleep apnea   CATARACT EXTRACTION W/PHACO Right 06/02/2019   Procedure: CATARACT EXTRACTION PHACO AND INTRAOCULAR LENS PLACEMENT (IOC) ISTENT INJ  RIGHT DIABETIC 1.31  00:25.4;  Surgeon: Eulogio Bear, MD;   Location: [facility];  Service: Ophthalmology;  Laterality: Right;   COLONOSCOPY  08/30/2015   FINGER SURGERY Right    3'rd finger, right hand   KNEE SURGERY Right    screw in knee   WRIST SURGERY Left    metal removed    There were no vitals filed for this visit.   Subjective Assessment - 03/31/21 0805     Subjective Patient reports compliance with HEP. Is waiting on his new braces. Needs to see his pulmonologist due to issues with breathing.    Pertinent History Pt is a 75 y.o. male referred to OPPT for LE weakness, bilat join OA, and diabetic peripheral neuropathy. PMH includes: Paroxysmal A-fib, COPD, DM Type 2, CHF, OA of bilat knees, HTN, glaucoma of both eyes. Pt reports losing weight 265 lbs to 210 lbs and this is when he noticed he started having generalized weakness and knee pain. Has received steroid injections which temporarily helped (~1 year ago). Pt having difficulty in bowling for fun, walking to complete community tasks and ADL's (requires electric buggy for shopping). Reports walking ~ 12 meters. Pt does report NO falls besides slipping on lane with bowling. Has had near falls but has used walls to catch himself. Pt reports being on 2 L/min for COPD, denies any balance impairments. Pt's goals for PT are to return  to bowling, and complete community walking tasks without having to take frequent seated rest breaks.    Limitations Lifting;Standing;Walking;House hold activities    How long can you sit comfortably? unlimited    How long can you stand comfortably? 3 minutes    How long can you walk comfortably? length of hallway in gym (10-12 m)    Patient Stated Goals Return to community ambulation.    Currently in Pain? No/denies                    BP at start of session: 121/86        3L of oxygen via nasal cannula throughout session with vitals monitored throughout session.  SpO2 ranged between 87-100%.    Treatment:     Nustep Lvl 6; seat  position 9; RPM>70 4 minutes for cardiovascular strengthening-SP02 drop to 87%Patient breathing requires rest break x2 due to breathing   TRX squat with chair 10x  Standing with CGA next to support surface:     ambulate 160 ft with CGA and pushing oxygen. Monitoring of Sp02 with cues for safety and sequencing.    Precor leg press bilateral LE"s #100, requires min A for placement of feet, cues for keeping knees from hyperextension 15x ; 2 sets; stabilization band to L knee for pain reduction ;    Precor leg press single LE #55 12x each LE ; stabilization to lateral aspect of L knee for pain reduction x2 sets each LE    Matrix hamstring curl with cable #7.5; 15x; 2 sets each LE  Matrix row 15x with 22.5 cable  first set; second set 32.5lb   SLS 30 seconds each LE  Bilateral heel raise 15x    Pt educated throughout session about proper posture and technique with exercises. Improved exercise technique, movement at target joints, use of target muscles after min to mod verbal, visual, tactile cues.       Patient's oxygen saturation limits today's session. Patient encouraged to follow up with physician. Patient did tolerate increased resistance for strengthening interventions with rest breaks required due to shortness of breath. Patient will benefit from skilled physical therapy improve patient's ambulation capacity, mobility, strength, and independence with IADLs/ADLS          PT Education - 03/31/21 0806     Education Details exercise technique, body mechanics    Person(s) Educated Patient    Methods Explanation;Demonstration;Tactile cues;Verbal cues    Comprehension Verbalized understanding;Returned demonstration;Verbal cues required;Tactile cues required              PT Short Term Goals - 03/29/21 0855       PT SHORT TERM GOAL #1   Title Pt will be independent with HEP to improve strength, balance, endurance to improve community ADL's.    Baseline 8/2: Initiate next  session 9/13: intermittent compliance 10/4: intermittent compliance 11/29: HEP compliance, new HEP given    Time 4    Period Weeks    Status Partially Met    Target Date 04/26/21               PT Long Term Goals - 03/29/21 0856       PT LONG TERM GOAL #1   Title Pt will increase FOTO to target to demonstrate improved ability to complete functional mobility and ADL's.    Baseline 8/2: 48/64 9/13: 57% 10/4:  57% 11/29: 51%    Time 8    Period Weeks    Status Partially  Met    Target Date 05/24/21      PT LONG TERM GOAL #2   Title Pt will improve 5xSTS time to < 10 sec to demonstrate clinically significant improvement in BLE strength.    Baseline 8/2: 13.8 sec 9/13: 12.41 seconds 10/4: 12.3 second arms crossed 11/29: 9.7 seconds without UE support    Time 8    Period Weeks    Status Achieved      PT LONG TERM GOAL #3   Title Pt will improve gait speed to at least 1.0 m/s to display evidenced norms to safely complete community wlaking tasks with decreased risk of falls.    Baseline 8/2: 0.678 m/s 9/13: 0.7 m/s 10/4: 0.87 m/s  11/29: 0.94 m/s with 8/10 knee pain    Time 8    Period Weeks    Status Partially Met    Target Date 05/24/21      PT LONG TERM GOAL #4   Title Pt will improve 6MWT to at least 1000' to demo ability to complete community distance walking tasks.    Baseline 8/2: Deferred due to BP readings. 8/4: 64 ft 9/13: 120 ft in 1 min 20 seconds 10/4: 200 ft with 2 min 58 seconds 11/29: 265 ft in 3 minutes and 3 seconds with 10/10 pain    Time 8    Period Weeks    Status Partially Met    Target Date 05/24/21      PT LONG TERM GOAL #5   Title Patient will increase Berg Balance score by > 6 points (42/56 ) to demonstrate decreased fall risk during functional activities.    Baseline 10/4: 36/56 11/29: 44/56    Time 8    Period Weeks    Status Achieved      Additional Long Term Goals   Additional Long Term Goals Yes      PT LONG TERM GOAL #6   Title Patient  will increase Berg Balance score by > 6 points (50/56 ) to demonstrate decreased fall risk during functional activities.    Baseline 11/29: 44/56    Time 8    Period Weeks    Status New    Target Date 05/24/21      PT LONG TERM GOAL #7   Title Patient will report less than 7/10 pain in L knee with ambulation improving quality of life and reduced pain mobility.    Baseline 11/29; 10/10 pain with prolonged ambulation    Time 8    Period Weeks    Status New    Target Date 05/24/21                   Plan - 03/31/21 7902     Clinical Impression Statement Patient's oxygen saturation limits today's session. Patient encouraged to follow up with physician. Patient did tolerate increased resistance for strengthening interventions with rest breaks required due to shortness of breath. Patient will benefit from skilled physical therapy improve patient's ambulation capacity, mobility, strength, and independence with IADLs/ADLS    Personal Factors and Comorbidities Time since onset of injury/illness/exacerbation;Age;Comorbidity 3+;Education;Past/Current Experience;Fitness    Comorbidities PMH includes: Paroxysmal A-fib, COPD, DM Type 2, CHF, OA of bilat knees, HTN, glaucoma of both eyes.    Examination-Activity Limitations Stairs;Squat;Stand;Locomotion Level    Examination-Participation Restrictions Community Activity    Stability/Clinical Decision Making Evolving/Moderate complexity    Rehab Potential Fair    PT Frequency 2x / week    PT Duration 8 weeks  PT Treatment/Interventions ADLs/Self Care Home Management;Cryotherapy;Gait training;Therapeutic exercise;Patient/family education;Manual techniques;Passive range of motion;Spinal Manipulations;Dry needling;Aquatic Therapy;Electrical Stimulation;Stair training;Functional mobility training;Neuromuscular re-education;Therapeutic activities;Balance training;Energy conservation;Moist Heat    PT Next Visit Plan BP, strengthening    PT Home  Exercise Plan Next session    Consulted and Agree with Plan of Care Patient             Patient will benefit from skilled therapeutic intervention in order to improve the following deficits and impairments:  Abnormal gait, Improper body mechanics, Decreased mobility, Decreased activity tolerance, Decreased balance, Decreased endurance, Decreased range of motion, Decreased strength, Difficulty walking, Decreased safety awareness, Impaired vision/preception  Visit Diagnosis: Abnormality of gait and mobility  Muscle weakness (generalized)  Difficulty in walking, not elsewhere classified     Problem List Patient Active Problem List   Diagnosis Date Noted   Bilateral primary osteoarthritis of knee 06/11/2019   Cervical radicular pain 06/11/2019   Cervical facet joint syndrome 06/11/2019   Lumbar facet arthropathy 06/11/2019   Lumbar radiculopathy 06/11/2019   Chronic pain syndrome 06/11/2019   Lung nodule 02/09/2016   Exercise hypoxemia 12/28/2015   Chronic obstructive pulmonary disease ([place]) 03/16/2015   Nuclear sclerosis 03/16/2015   OSA (obstructive sleep apnea) 03/16/2015   Primary open angle glaucoma of both eyes, severe stage 03/16/2015   Type 2 diabetes mellitus without complication, without long-term current use of insulin ([place]) 03/16/2015   Arthralgia of left lower leg 10/29/2014   Osteoarthritis of left knee 09/10/2014   Type 2 diabetes mellitus with diabetic neuropathy ([place]) 08/04/2014   Cataract, nuclear sclerotic senile 04/01/2014   Combined forms of age-related cataract 04/01/2014   Keratitis sicca, bilateral ([place]) 04/01/2014   Primary open angle glaucoma 02/27/2014   Flat feet 01/29/2014   Foot pain, bilateral 01/29/2014   Tinea pedis of both feet 01/29/2014   Ptosis of both eyelids 11/25/2013   Type 2 diabetes mellitus without retinopathy ([place]) 11/25/2013   Onychomycosis 48/18/5631   Chronic systolic heart failure ([place]) 11/11/2013   Emphysema of lung  ([place]) 11/11/2013   Essential hypertension 11/11/2013   Screen for colon cancer 11/05/2013   Cardiomyopathy ([place]) 49/70/2637   Systolic HF (heart failure) ([place]) 85/88/5027   Acute systolic CHF (congestive heart failure) ([place]) 09/16/2013   COPD (chronic obstructive pulmonary disease) ([place]) 09/16/2013   HTN (hypertension) 09/16/2013   Obesity 09/16/2013   PAF (paroxysmal atrial fibrillation) ([place]) 09/16/2013   Senile nuclear sclerosis 05/22/2013   Muscle weakness (generalized) 11/11/2009   Long term current use of anticoagulant therapy 06/17/2009   Pulmonary embolism ([place]) 06/17/2009   ED (erectile dysfunction) of organic origin 05/13/2003    Janna Arch, PT, DPT  03/31/2021, 8:45 AM  [place] MAIN [facility] SERVICES [address] [place], [place], [address] Phone: [phone]   Fax:  [phone]  Name: Nochum Fenter MRN: [id number] Date of Birth: Oct 07, 1945

## 2021-04-05 ENCOUNTER — Ambulatory Visit: Payer: Medicare Other

## 2021-04-07 ENCOUNTER — Ambulatory Visit: Payer: Medicare Other

## 2021-04-12 ENCOUNTER — Other Ambulatory Visit: Payer: Self-pay

## 2021-04-12 ENCOUNTER — Ambulatory Visit: Payer: Medicare Other

## 2021-04-12 DIAGNOSIS — R269 Unspecified abnormalities of gait and mobility: Secondary | ICD-10-CM

## 2021-04-12 DIAGNOSIS — M6281 Muscle weakness (generalized): Secondary | ICD-10-CM

## 2021-04-12 DIAGNOSIS — R262 Difficulty in walking, not elsewhere classified: Secondary | ICD-10-CM

## 2021-04-12 NOTE — Therapy (Signed)
[place] MAIN [facility] SERVICES [address] [place], [place], [address] Phone: [phone]   Fax:  [phone]  Physical Therapy Treatment  Patient Details  Name: Mario [name] MRN: [id number] Date of Birth: 1945-10-12 Referring Provider (PT): [name], Victoriano Lain, NP   Encounter Date: 04/12/2021   PT End of Session - 04/12/21 0811     Visit Number 25    Number of Visits 39    Date for PT Re-Evaluation 05/24/21    Authorization Type 5/10 PN 11/15    PT Start Time 0801    PT Stop Time 0844    PT Time Calculation (min) 43 min    Equipment Utilized During Treatment Gait belt    Activity Tolerance Patient tolerated treatment well;Treatment limited secondary to medical complications (Comment);Other (comment)   Elevated BP readings            Past Medical History:  Diagnosis Date   Arthritis    hands, knees   Atrial fibrillation (HCC)    CHF (congestive heart failure) (HCC)    COPD (chronic obstructive pulmonary disease) (HCC)    Diabetes mellitus without complication (HCC)    Diabetic neuropathy (HCC)    Heart murmur    in past, mild 1969   Hypertension    resolved after weight loss and more activity   Neuromuscular disorder ([place])    neuropathy   Neuropathy    Pulmonary embolism ([place])    approx 2014   Shingles '99   Sleep apnea    CPAP   Wears dentures    full upper and lower    Past Surgical History:  Procedure Laterality Date   CATARACT EXTRACTION W/PHACO Left 05/12/2019   Procedure: CATARACT EXTRACTION PHACO AND INTRAOCULAR LENS PLACEMENT ([place]) LEFTDIABETIC ISTENT INJ 2.43  00:28.6;  Surgeon: Eulogio Bear, MD;  Location: [place];  Service: Ophthalmology;  Laterality: Left;  Diabetic - oral meds sleep apnea   CATARACT EXTRACTION W/PHACO Right 06/02/2019   Procedure: CATARACT EXTRACTION PHACO AND INTRAOCULAR LENS PLACEMENT ([facility]) ISTENT INJ  RIGHT DIABETIC 1.31  00:25.4;  Surgeon: Eulogio Bear, MD;   Location: [place];  Service: Ophthalmology;  Laterality: Right;   COLONOSCOPY  08/30/2015   FINGER SURGERY Right    3'rd finger, right hand   KNEE SURGERY Right    screw in knee   WRIST SURGERY Left    metal removed    There were no vitals filed for this visit.   Subjective Assessment - 04/12/21 0807     Subjective Not seen since 04/11/2021  due to illness. Patient is getting his new braces tomorrow. Received an injection in L knee last Wednesday.    Pertinent History Pt is a 75 y.o. male referred to OPPT for LE weakness, bilat join OA, and diabetic peripheral neuropathy. PMH includes: Paroxysmal A-fib, COPD, DM Type 2, CHF, OA of bilat knees, HTN, glaucoma of both eyes. Pt reports losing weight 265 lbs to 210 lbs and this is when he noticed he started having generalized weakness and knee pain. Has received steroid injections which temporarily helped (~1 year ago). Pt having difficulty in bowling for fun, walking to complete community tasks and ADL's (requires electric buggy for shopping). Reports walking ~ 12 meters. Pt does report NO falls besides slipping on lane with bowling. Has had near falls but has used walls to catch himself. Pt reports being on 2 L/min for COPD, denies any balance impairments. Pt's goals for PT are  to return to bowling, and complete community walking tasks without having to take frequent seated rest breaks.    Limitations Lifting;Standing;Walking;House hold activities    How long can you sit comfortably? unlimited    How long can you stand comfortably? 3 minutes    How long can you walk comfortably? length of hallway in gym (10-12 m)    Patient Stated Goals Return to community ambulation.    Currently in Pain? No/denies                     Not seen since 04/11/2021  due to illness. Patient is getting his new braces tomorrow. Received an injection in L knee last Wednesday.   BP at start of session: 144/82   3L of oxygen via nasal cannula  throughout session with vitals monitored throughout session.  SpO2 ranged between 87-100%.    Treatment:     Nustep Lvl 4-5; seat position 9; RPM>70 4 minutes for cardiovascular strengthening    ambulate 100 ft with CGA and pushing oxygen. Monitoring of Sp02 with cues for safety and sequencing.    Precor leg press bilateral LE"s #115 requires min A for placement of feet, cues for keeping knees from hyperextension 15x ; 2 sets; stabilization band to L knee for pain reduction ;    Precor leg press single LE #55 12x each LE ; stabilization to lateral aspect of L knee for pain reduction ; second set #70 10x each LE    Matrix hamstring curl with cable #7.5; 20x; 2 sets each LE  Matrix row 20x with 32.5lb x2 trials; cues for squeezing shoulder blades.    Tandem stance 30 seconds each LE placement with BUE support  Bilateral heel raise 15x    Pt educated throughout session about proper posture and technique with exercises. Improved exercise technique, movement at target joints, use of target muscles after min to mod verbal, visual, tactile cues.      Patient returning to therapy after illness. He remains highly motivated throughout session and is eager to progress strengthening interventions. Patient is progressing with functional strength and mobility with increased weight performed with bilateral leg press. Patient's Sp02 improved this session with decreased need for rest breaks. Patient will benefit from skilled physical therapy improve patient's ambulation capacity, mobility, strength, and independence with IADLs/ADLS                 PT Education - 04/12/21 0810     Education Details exercise technique, body  mechanics    Person(s) Educated Patient    Methods Explanation;Demonstration;Tactile cues;Verbal cues    Comprehension Verbalized understanding;Returned demonstration;Verbal cues required;Tactile cues required              PT Short Term Goals - 03/29/21 0855        PT SHORT TERM GOAL #1   Title Pt will be independent with HEP to improve strength, balance, endurance to improve community ADL's.    Baseline 8/2: Initiate next session 9/13: intermittent compliance 10/4: intermittent compliance 11/29: HEP compliance, new HEP given    Time 4    Period Weeks    Status Partially Met    Target Date 04/26/21               PT Long Term Goals - 03/29/21 0856       PT LONG TERM GOAL #1   Title Pt will increase FOTO to target to demonstrate improved ability to complete functional mobility and ADL's.  Baseline 8/2: 48/64 9/13: 57% 10/4:  57% 11/29: 51%    Time 8    Period Weeks    Status Partially Met    Target Date 05/24/21      PT LONG TERM GOAL #2   Title Pt will improve 5xSTS time to < 10 sec to demonstrate clinically significant improvement in BLE strength.    Baseline 8/2: 13.8 sec 9/13: 12.41 seconds 10/4: 12.3 second arms crossed 11/29: 9.7 seconds without UE support    Time 8    Period Weeks    Status Achieved      PT LONG TERM GOAL #3   Title Pt will improve gait speed to at least 1.0 m/s to display evidenced norms to safely complete community wlaking tasks with decreased risk of falls.    Baseline 8/2: 0.678 m/s 9/13: 0.7 m/s 10/4: 0.87 m/s  11/29: 0.94 m/s with 8/10 knee pain    Time 8    Period Weeks    Status Partially Met    Target Date 05/24/21      PT LONG TERM GOAL #4   Title Pt will improve 6MWT to at least 1000' to demo ability to complete community distance walking tasks.    Baseline 8/2: Deferred due to BP readings. 8/4: 64 ft 9/13: 120 ft in 1 min 20 seconds 10/4: 200 ft with 2 min 58 seconds 11/29: 265 ft in 3 minutes and 3 seconds with 10/10 pain    Time 8    Period Weeks    Status Partially Met    Target Date 05/24/21      PT LONG TERM GOAL #5   Title Patient will increase Berg Balance score by > 6 points (42/56 ) to demonstrate decreased fall risk during functional activities.    Baseline 10/4: 36/56  11/29: 44/56    Time 8    Period Weeks    Status Achieved      Additional Long Term Goals   Additional Long Term Goals Yes      PT LONG TERM GOAL #6   Title Patient will increase Berg Balance score by > 6 points (50/56 ) to demonstrate decreased fall risk during functional activities.    Baseline 11/29: 44/56    Time 8    Period Weeks    Status New    Target Date 05/24/21      PT LONG TERM GOAL #7   Title Patient will report less than 7/10 pain in L knee with ambulation improving quality of life and reduced pain mobility.    Baseline 11/29; 10/10 pain with prolonged ambulation    Time 8    Period Weeks    Status New    Target Date 05/24/21                   Plan - 04/12/21 3267     Clinical Impression Statement Patient returning to therapy after illness. He remains highly motivated throughout session and is eager to progress strengthening interventions. Patient is progressing with functional strength and mobility with increased weight performed with bilateral leg press. Patient's Sp02 improved this session with decreased need for rest breaks. Patient will benefit from skilled physical therapy improve patient's ambulation capacity, mobility, strength, and independence with IADLs/ADLS    Personal Factors and Comorbidities Time since onset of injury/illness/exacerbation;Age;Comorbidity 3+;Education;Past/Current Experience;Fitness    Comorbidities PMH includes: Paroxysmal A-fib, COPD, DM Type 2, CHF, OA of bilat knees, HTN, glaucoma of both eyes.  Examination-Activity Limitations Stairs;Squat;Stand;Locomotion Level    Examination-Participation Restrictions Community Activity    Stability/Clinical Decision Making Evolving/Moderate complexity    Rehab Potential Fair    PT Frequency 2x / week    PT Duration 8 weeks    PT Treatment/Interventions ADLs/Self Care Home Management;Cryotherapy;Gait training;Therapeutic exercise;Patient/family education;Manual techniques;Passive  range of motion;Spinal Manipulations;Dry needling;Aquatic Therapy;Electrical Stimulation;Stair training;Functional mobility training;Neuromuscular re-education;Therapeutic activities;Balance training;Energy conservation;Moist Heat    PT Next Visit Plan BP, strengthening    PT Home Exercise Plan Next session    Consulted and Agree with Plan of Care Patient             Patient will benefit from skilled therapeutic intervention in order to improve the following deficits and impairments:  Abnormal gait, Improper body mechanics, Decreased mobility, Decreased activity tolerance, Decreased balance, Decreased endurance, Decreased range of motion, Decreased strength, Difficulty walking, Decreased safety awareness, Impaired vision/preception  Visit Diagnosis: Abnormality of gait and mobility  Muscle weakness (generalized)  Difficulty in walking, not elsewhere classified     Problem List Patient Active Problem List   Diagnosis Date Noted   Bilateral primary osteoarthritis of knee 06/11/2019   Cervical radicular pain 06/11/2019   Cervical facet joint syndrome 06/11/2019   Lumbar facet arthropathy 06/11/2019   Lumbar radiculopathy 06/11/2019   Chronic pain syndrome 06/11/2019   Lung nodule 02/09/2016   Exercise hypoxemia 12/28/2015   Chronic obstructive pulmonary disease ([place]) 03/16/2015   Nuclear sclerosis 03/16/2015   OSA (obstructive sleep apnea) 03/16/2015   Primary open angle glaucoma of both eyes, severe stage 03/16/2015   Type 2 diabetes mellitus without complication, without long-term current use of insulin ([place]) 03/16/2015   Arthralgia of left lower leg 10/29/2014   Osteoarthritis of left knee 09/10/2014   Type 2 diabetes mellitus with diabetic neuropathy ([place]) 08/04/2014   Cataract, nuclear sclerotic senile 04/01/2014   Combined forms of age-related cataract 04/01/2014   Keratitis sicca, bilateral ([place]) 04/01/2014   Primary open angle glaucoma 02/27/2014   Flat feet  01/29/2014   Foot pain, bilateral 01/29/2014   Tinea pedis of both feet 01/29/2014   Ptosis of both eyelids 11/25/2013   Type 2 diabetes mellitus without retinopathy ([place]) 11/25/2013   Onychomycosis 56/81/2751   Chronic systolic heart failure ([place]) 11/11/2013   Emphysema of lung ([place]) 11/11/2013   Essential hypertension 11/11/2013   Screen for colon cancer 11/05/2013   Cardiomyopathy ([place]) 70/05/7492   Systolic HF (heart failure) ([place]) 49/67/5916   Acute systolic CHF (congestive heart failure) ([place]) 09/16/2013   COPD (chronic obstructive pulmonary disease) ([place]) 09/16/2013   HTN (hypertension) 09/16/2013   Obesity 09/16/2013   PAF (paroxysmal atrial fibrillation) ([place]) 09/16/2013   Senile nuclear sclerosis 05/22/2013   Muscle weakness (generalized) 11/11/2009   Long term current use of anticoagulant therapy 06/17/2009   Pulmonary embolism ([place]) 06/17/2009   ED (erectile dysfunction) of organic origin 05/13/2003    Janna Arch, PT, DPT  04/12/2021, 8:47 AM  [place] MAIN [facility] SERVICES [address] [place], [place], [address] Phone: [phone]   Fax:  [phone]  Name: Mario [name] MRN: [id number] Date of Birth: [birth year]

## 2021-04-14 ENCOUNTER — Ambulatory Visit: Payer: Medicare Other

## 2021-04-14 ENCOUNTER — Other Ambulatory Visit: Payer: Self-pay

## 2021-04-14 DIAGNOSIS — R262 Difficulty in walking, not elsewhere classified: Secondary | ICD-10-CM

## 2021-04-14 DIAGNOSIS — R269 Unspecified abnormalities of gait and mobility: Secondary | ICD-10-CM

## 2021-04-14 DIAGNOSIS — M6281 Muscle weakness (generalized): Secondary | ICD-10-CM

## 2021-04-14 NOTE — Therapy (Signed)
[place] MAIN [facility] SERVICES [address] [place], [place], [address] Phone: [phone]   Fax:  [phone]  Physical Therapy Treatment  Patient Details  Name: Mario [name] MRN: [id number] Date of Birth: 03-11-1946 Referring Provider (PT): [name], Victoriano Lain, NP   Encounter Date: 04/14/2021   PT End of Session - 04/14/21 0756     Visit Number 26    Number of Visits 39    Date for PT Re-Evaluation 05/24/21    Authorization Type 6/10 PN 11/15    PT Start Time 0759    PT Stop Time 0844    PT Time Calculation (min) 45 min    Equipment Utilized During Treatment Gait belt    Activity Tolerance Patient tolerated treatment well;Treatment limited secondary to medical complications (Comment);Other (comment)   Elevated BP readings            Past Medical History:  Diagnosis Date   Arthritis    hands, knees   Atrial fibrillation (HCC)    CHF (congestive heart failure) (HCC)    COPD (chronic obstructive pulmonary disease) (HCC)    Diabetes mellitus without complication (HCC)    Diabetic neuropathy (HCC)    Heart murmur    in past, mild 1969   Hypertension    resolved after weight loss and more activity   Neuromuscular disorder ([place])    neuropathy   Neuropathy    Pulmonary embolism ([place])    approx 2014   Shingles '99   Sleep apnea    CPAP   Wears dentures    full upper and lower    Past Surgical History:  Procedure Laterality Date   CATARACT EXTRACTION W/PHACO Left 05/12/2019   Procedure: CATARACT EXTRACTION PHACO AND INTRAOCULAR LENS PLACEMENT ([place]) LEFTDIABETIC ISTENT INJ 2.43  00:28.6;  Surgeon: Eulogio Bear, MD;  Location: [place];  Service: Ophthalmology;  Laterality: Left;  Diabetic - oral meds sleep apnea   CATARACT EXTRACTION W/PHACO Right 06/02/2019   Procedure: CATARACT EXTRACTION PHACO AND INTRAOCULAR LENS PLACEMENT ([facility]) ISTENT INJ  RIGHT DIABETIC 1.31  00:25.4;  Surgeon: Eulogio Bear, MD;   Location: [place];  Service: Ophthalmology;  Laterality: Right;   COLONOSCOPY  08/30/2015   FINGER SURGERY Right    3'rd finger, right hand   KNEE SURGERY Right    screw in knee   WRIST SURGERY Left    metal removed    There were no vitals filed for this visit.   Subjective Assessment - 04/14/21 0802     Subjective Patient reports yesterday he got another shot in his L knee, got a brace for his L knee. No falls or LOB since last session.    Pertinent History Pt is a 75 y.o. male referred to OPPT for LE weakness, bilat join OA, and diabetic peripheral neuropathy. PMH includes: Paroxysmal A-fib, COPD, DM Type 2, CHF, OA of bilat knees, HTN, glaucoma of both eyes. Pt reports losing weight 265 lbs to 210 lbs and this is when he noticed he started having generalized weakness and knee pain. Has received steroid injections which temporarily helped (~1 year ago). Pt having difficulty in bowling for fun, walking to complete community tasks and ADL's (requires electric buggy for shopping). Reports walking ~ 12 meters. Pt does report NO falls besides slipping on lane with bowling. Has had near falls but has used walls to catch himself. Pt reports being on 2 L/min for COPD, denies any balance impairments. Pt's goals for  PT are to return to bowling, and complete community walking tasks without having to take frequent seated rest breaks.    Limitations Lifting;Standing;Walking;House hold activities    How long can you sit comfortably? unlimited    How long can you stand comfortably? 3 minutes    How long can you walk comfortably? length of hallway in gym (10-12 m)    Patient Stated Goals Return to community ambulation.    Currently in Pain? No/denies                BP at start of session: 119/71    3L of oxygen via nasal cannula throughout session with vitals monitored throughout session.  SpO2 ranged between 84-100%.    Treatment:     Nustep Lvl 4-5; seat position 9; RPM>70 4  minutes for cardiovascular strengthening; Sp02 drop to 84%    6" step/staircase: -step up down 10x each LE; BUE support -unable to tolerate with LLE initially but after correction of brace is able to perform.  -airex pad 6" step modified tandem stance 30 seconds x 2 trials    ambulate 160 ft with CGA and pushing oxygen. Monitoring of Sp02 with cues for safety and sequencing.    Precor leg press bilateral LE"s #130 requires min A for placement of feet, cues for keeping knees from hyperextension 15x ; 2 sets; stabilization band to L knee for pain reduction ;    Precor leg press single LE #70 12x each LE ; stabilization to lateral aspect of L knee for pain reduction ; x2 sets    SLS 30 seconds each LE    Tandem stance 30 seconds each LE placement with BUE support   10x STS -terminated after 4 due to pain   Pt educated throughout session about proper posture and technique with exercises. Improved exercise technique, movement at target joints, use of target muscles after min to mod verbal, visual, tactile cues       Patient presents wearing his new brace on his LLE. He requires decreased assistance with stabilization for pain reduction this session. Increased weight tolerated during leg press this session. RLE is more fatigued than LLE however is not painful. Patient is unable to tolerate step ups with LLE initially but improved after correction of brace. Patient will benefit from skilled physical therapy improve patient's ambulation capacity, mobility, strength, and independence with IADLs/ADLS                 PT Education - 04/14/21 0756     Education Details exercise technique, body mechanics    Person(s) Educated Patient    Methods Explanation;Demonstration;Tactile cues;Verbal cues    Comprehension Verbalized understanding;Other (comment);Verbal cues required;Returned demonstration;Tactile cues required              PT Short Term Goals - 03/29/21 0855       PT  SHORT TERM GOAL #1   Title Pt will be independent with HEP to improve strength, balance, endurance to improve community ADL's.    Baseline 8/2: Initiate next session 9/13: intermittent compliance 10/4: intermittent compliance 11/29: HEP compliance, new HEP given    Time 4    Period Weeks    Status Partially Met    Target Date 04/26/21               PT Long Term Goals - 03/29/21 0856       PT LONG TERM GOAL #1   Title Pt will increase FOTO to target to demonstrate  improved ability to complete functional mobility and ADL's.    Baseline 8/2: 48/64 9/13: 57% 10/4:  57% 11/29: 51%    Time 8    Period Weeks    Status Partially Met    Target Date 05/24/21      PT LONG TERM GOAL #2   Title Pt will improve 5xSTS time to < 10 sec to demonstrate clinically significant improvement in BLE strength.    Baseline 8/2: 13.8 sec 9/13: 12.41 seconds 10/4: 12.3 second arms crossed 11/29: 9.7 seconds without UE support    Time 8    Period Weeks    Status Achieved      PT LONG TERM GOAL #3   Title Pt will improve gait speed to at least 1.0 m/s to display evidenced norms to safely complete community wlaking tasks with decreased risk of falls.    Baseline 8/2: 0.678 m/s 9/13: 0.7 m/s 10/4: 0.87 m/s  11/29: 0.94 m/s with 8/10 knee pain    Time 8    Period Weeks    Status Partially Met    Target Date 05/24/21      PT LONG TERM GOAL #4   Title Pt will improve 6MWT to at least 1000' to demo ability to complete community distance walking tasks.    Baseline 8/2: Deferred due to BP readings. 8/4: 64 ft 9/13: 120 ft in 1 min 20 seconds 10/4: 200 ft with 2 min 58 seconds 11/29: 265 ft in 3 minutes and 3 seconds with 10/10 pain    Time 8    Period Weeks    Status Partially Met    Target Date 05/24/21      PT LONG TERM GOAL #5   Title Patient will increase Berg Balance score by > 6 points (42/56 ) to demonstrate decreased fall risk during functional activities.    Baseline 10/4: 36/56 11/29: 44/56     Time 8    Period Weeks    Status Achieved      Additional Long Term Goals   Additional Long Term Goals Yes      PT LONG TERM GOAL #6   Title Patient will increase Berg Balance score by > 6 points (50/56 ) to demonstrate decreased fall risk during functional activities.    Baseline 11/29: 44/56    Time 8    Period Weeks    Status New    Target Date 05/24/21      PT LONG TERM GOAL #7   Title Patient will report less than 7/10 pain in L knee with ambulation improving quality of life and reduced pain mobility.    Baseline 11/29; 10/10 pain with prolonged ambulation    Time 8    Period Weeks    Status New    Target Date 05/24/21                   Plan - 04/14/21 0857     Clinical Impression Statement Patient presents wearing his new brace on his LLE. He requires decreased assistance with stabilization for pain reduction this session. Increased weight tolerated during leg press this session. RLE is more fatigued than LLE however is not painful. Patient is unable to tolerate step ups with LLE initially but improved after correction of brace. Patient will benefit from skilled physical therapy improve patient's ambulation capacity, mobility, strength, and independence with IADLs/ADLS    Personal Factors and Comorbidities Time since onset of injury/illness/exacerbation;Age;Comorbidity 3+;Education;Past/Current Experience;Fitness    Comorbidities PMH includes:  Paroxysmal A-fib, COPD, DM Type 2, CHF, OA of bilat knees, HTN, glaucoma of both eyes.    Examination-Activity Limitations Stairs;Squat;Stand;Locomotion Level    Examination-Participation Restrictions Community Activity    Stability/Clinical Decision Making Evolving/Moderate complexity    Rehab Potential Fair    PT Frequency 2x / week    PT Duration 8 weeks    PT Treatment/Interventions ADLs/Self Care Home Management;Cryotherapy;Gait training;Therapeutic exercise;Patient/family education;Manual techniques;Passive range of  motion;Spinal Manipulations;Dry needling;Aquatic Therapy;Electrical Stimulation;Stair training;Functional mobility training;Neuromuscular re-education;Therapeutic activities;Balance training;Energy conservation;Moist Heat    PT Next Visit Plan BP, strengthening    PT Home Exercise Plan Next session    Consulted and Agree with Plan of Care Patient             Patient will benefit from skilled therapeutic intervention in order to improve the following deficits and impairments:  Abnormal gait, Improper body mechanics, Decreased mobility, Decreased activity tolerance, Decreased balance, Decreased endurance, Decreased range of motion, Decreased strength, Difficulty walking, Decreased safety awareness, Impaired vision/preception  Visit Diagnosis: Abnormality of gait and mobility  Muscle weakness (generalized)  Difficulty in walking, not elsewhere classified     Problem List Patient Active Problem List   Diagnosis Date Noted   Bilateral primary osteoarthritis of knee 06/11/2019   Cervical radicular pain 06/11/2019   Cervical facet joint syndrome 06/11/2019   Lumbar facet arthropathy 06/11/2019   Lumbar radiculopathy 06/11/2019   Chronic pain syndrome 06/11/2019   Lung nodule 02/09/2016   Exercise hypoxemia 12/28/2015   Chronic obstructive pulmonary disease ([place]) 03/16/2015   Nuclear sclerosis 03/16/2015   OSA (obstructive sleep apnea) 03/16/2015   Primary open angle glaucoma of both eyes, severe stage 03/16/2015   Type 2 diabetes mellitus without complication, without long-term current use of insulin ([place]) 03/16/2015   Arthralgia of left lower leg 10/29/2014   Osteoarthritis of left knee 09/10/2014   Type 2 diabetes mellitus with diabetic neuropathy ([place]) 08/04/2014   Cataract, nuclear sclerotic senile 04/01/2014   Combined forms of age-related cataract 04/01/2014   Keratitis sicca, bilateral ([place]) 04/01/2014   Primary open angle glaucoma 02/27/2014   Flat feet 01/29/2014    Foot pain, bilateral 01/29/2014   Tinea pedis of both feet 01/29/2014   Ptosis of both eyelids 11/25/2013   Type 2 diabetes mellitus without retinopathy ([place]) 11/25/2013   Onychomycosis 82/50/5397   Chronic systolic heart failure ([place]) 11/11/2013   Emphysema of lung ([place]) 11/11/2013   Essential hypertension 11/11/2013   Screen for colon cancer 11/05/2013   Cardiomyopathy ([place]) 67/34/1937   Systolic HF (heart failure) ([place]) 90/24/0973   Acute systolic CHF (congestive heart failure) ([place]) 09/16/2013   COPD (chronic obstructive pulmonary disease) ([place]) 09/16/2013   HTN (hypertension) 09/16/2013   Obesity 09/16/2013   PAF (paroxysmal atrial fibrillation) ([place]) 09/16/2013   Senile nuclear sclerosis 05/22/2013   Muscle weakness (generalized) 11/11/2009   Long term current use of anticoagulant therapy 06/17/2009   Pulmonary embolism ([place]) 06/17/2009   ED (erectile dysfunction) of organic origin 05/13/2003    Janna Arch, PT, DPT  04/14/2021, 8:59 AM  [address] MAIN [facility] SERVICES [address] [place], [place], [address] Phone: [phone]   Fax:  [phone]  Name: Mario [name] MRN: [id number] Date of Birth: Jun 11, 1945

## 2021-04-19 ENCOUNTER — Ambulatory Visit: Payer: Medicare Other

## 2021-04-19 ENCOUNTER — Other Ambulatory Visit: Payer: Self-pay

## 2021-04-19 DIAGNOSIS — R269 Unspecified abnormalities of gait and mobility: Secondary | ICD-10-CM

## 2021-04-19 DIAGNOSIS — M6281 Muscle weakness (generalized): Secondary | ICD-10-CM

## 2021-04-19 DIAGNOSIS — R262 Difficulty in walking, not elsewhere classified: Secondary | ICD-10-CM

## 2021-04-19 NOTE — Therapy (Signed)
[place] MAIN [facility] SERVICES [address] [place], [place], [address] Phone: [phone]   Fax:  [phone]  Physical Therapy Treatment  Patient Details  Name: Mario [name] MRN: [id number] Date of Birth: Dec 08, 1945 Referring Provider (PT): [name], Victoriano Lain, NP   Encounter Date: 04/19/2021   PT End of Session - 04/19/21 0807     Visit Number 27    Number of Visits 39    Date for PT Re-Evaluation 05/24/21    Authorization Type 7/10 PN 11/15    PT Start Time 0800    PT Stop Time 0844    PT Time Calculation (min) 44 min    Equipment Utilized During Treatment Gait belt    Activity Tolerance Patient tolerated treatment well;Treatment limited secondary to medical complications (Comment);Other (comment)   Elevated BP readings            Past Medical History:  Diagnosis Date   Arthritis    hands, knees   Atrial fibrillation (HCC)    CHF (congestive heart failure) (HCC)    COPD (chronic obstructive pulmonary disease) (HCC)    Diabetes mellitus without complication (HCC)    Diabetic neuropathy (HCC)    Heart murmur    in past, mild 1969   Hypertension    resolved after weight loss and more activity   Neuromuscular disorder ([place])    neuropathy   Neuropathy    Pulmonary embolism ([place])    approx 2014   Shingles '99   Sleep apnea    CPAP   Wears dentures    full upper and lower    Past Surgical History:  Procedure Laterality Date   CATARACT EXTRACTION W/PHACO Left 05/12/2019   Procedure: CATARACT EXTRACTION PHACO AND INTRAOCULAR LENS PLACEMENT ([place]) LEFTDIABETIC ISTENT INJ 2.43  00:28.6;  Surgeon: Eulogio Bear, MD;  Location: [place];  Service: Ophthalmology;  Laterality: Left;  Diabetic - oral meds sleep apnea   CATARACT EXTRACTION W/PHACO Right 06/02/2019   Procedure: CATARACT EXTRACTION PHACO AND INTRAOCULAR LENS PLACEMENT ([facility]) ISTENT INJ  RIGHT DIABETIC 1.31  00:25.4;  Surgeon: Eulogio Bear, MD;   Location: [place];  Service: Ophthalmology;  Laterality: Right;   COLONOSCOPY  08/30/2015   FINGER SURGERY Right    3'rd finger, right hand   KNEE SURGERY Right    screw in knee   WRIST SURGERY Left    metal removed    There were no vitals filed for this visit.   Subjective Assessment - 04/19/21 0801     Subjective Patient reports his L knee pain is higher today and this weekend. Limited his activity this weekend.    Pertinent History Pt is a 75 y.o. male referred to OPPT for LE weakness, bilat join OA, and diabetic peripheral neuropathy. PMH includes: Paroxysmal A-fib, COPD, DM Type 2, CHF, OA of bilat knees, HTN, glaucoma of both eyes. Pt reports losing weight 265 lbs to 210 lbs and this is when he noticed he started having generalized weakness and knee pain. Has received steroid injections which temporarily helped (~1 year ago). Pt having difficulty in bowling for fun, walking to complete community tasks and ADL's (requires electric buggy for shopping). Reports walking ~ 12 meters. Pt does report NO falls besides slipping on lane with bowling. Has had near falls but has used walls to catch himself. Pt reports being on 2 L/min for COPD, denies any balance impairments. Pt's goals for PT are to return to bowling, and complete  community walking tasks without having to take frequent seated rest breaks.    Limitations Lifting;Standing;Walking;House hold activities    How long can you sit comfortably? unlimited    How long can you stand comfortably? 3 minutes    How long can you walk comfortably? length of hallway in gym (10-12 m)    Patient Stated Goals Return to community ambulation.    Currently in Pain? Yes    Pain Score 8     Pain Location Leg    Pain Orientation Left    Pain Descriptors / Indicators Aching    Pain Type Chronic pain    Pain Onset More than a month ago    Pain Frequency Intermittent                hyalgan injection will be received tomorrow in L  knee     Treatment: BP at start of session 127/114 R arm; altered due to patient moving while attempting to take BP at start of the session L arm 138/89   TherEx Nustep Lvl 5 RMP> 70 for cardiovascular and musculoskeletal challenge 4 minutes Hamstring isometric pressing into green dynadisc 12x 3 second holds 3lb ankle weights: lateral stepping 4x length of // bars ; heavy BUE support 3lb high knee marches 10x each LE with BUE support  Seated: 3lb ankle weights LAQ 15x each LE  3lb ankle weight: seated upright without back support march 10x each LE  3lb ankle weight alternating heel toe raises 15x   Adduction with LAQ rainbow ball between feet 12x Overhead raise with compression adduction press of ball 12x "squeeze and raise ball overhead"  Neuro Re-ed: Standing with CGA next to support surface:  Airex pad: static stand 30 seconds x 2 trials, noticeable trembling of ankles/LE's with fatigue and challenge to maintain stability Airex pad: horizontal head turns 30 seconds scanning room 10x ; cueing for arc of motion  Airex pad: vertical head turns 30 seconds, cueing for arc of motion, noticeable sway with upward gaze increasing demand on ankle righting reaction musculature Airex pad: one foot on 6" step one foot on airex pad, hold position for 30 seconds, switch legs, 2x each LE;   Vitals monitored throughout session to ensure therapeutic range.   Pt educated throughout session about proper posture and technique with exercises. Improved exercise technique, movement at target joints, use of target muscles after min to mod verbal, visual, tactile cues.             PT Education - 04/19/21 0807     Education Details exercise technique, body mechanics    Person(s) Educated Patient    Methods Explanation;Demonstration;Tactile cues;Verbal cues    Comprehension Verbalized understanding;Verbal cues required;Returned demonstration;Tactile cues required              PT Short  Term Goals - 03/29/21 0855       PT SHORT TERM GOAL #1   Title Pt will be independent with HEP to improve strength, balance, endurance to improve community ADL's.    Baseline 8/2: Initiate next session 9/13: intermittent compliance 10/4: intermittent compliance 11/29: HEP compliance, new HEP given    Time 4    Period Weeks    Status Partially Met    Target Date 04/26/21               PT Long Term Goals - 03/29/21 0856       PT LONG TERM GOAL #1   Title Pt will increase FOTO  to target to demonstrate improved ability to complete functional mobility and ADL's.    Baseline 8/2: 48/64 9/13: 57% 10/4:  57% 11/29: 51%    Time 8    Period Weeks    Status Partially Met    Target Date 05/24/21      PT LONG TERM GOAL #2   Title Pt will improve 5xSTS time to < 10 sec to demonstrate clinically significant improvement in BLE strength.    Baseline 8/2: 13.8 sec 9/13: 12.41 seconds 10/4: 12.3 second arms crossed 11/29: 9.7 seconds without UE support    Time 8    Period Weeks    Status Achieved      PT LONG TERM GOAL #3   Title Pt will improve gait speed to at least 1.0 m/s to display evidenced norms to safely complete community wlaking tasks with decreased risk of falls.    Baseline 8/2: 0.678 m/s 9/13: 0.7 m/s 10/4: 0.87 m/s  11/29: 0.94 m/s with 8/10 knee pain    Time 8    Period Weeks    Status Partially Met    Target Date 05/24/21      PT LONG TERM GOAL #4   Title Pt will improve 6MWT to at least 1000' to demo ability to complete community distance walking tasks.    Baseline 8/2: Deferred due to BP readings. 8/4: 64 ft 9/13: 120 ft in 1 min 20 seconds 10/4: 200 ft with 2 min 58 seconds 11/29: 265 ft in 3 minutes and 3 seconds with 10/10 pain    Time 8    Period Weeks    Status Partially Met    Target Date 05/24/21      PT LONG TERM GOAL #5   Title Patient will increase Berg Balance score by > 6 points (42/56 ) to demonstrate decreased fall risk during functional activities.     Baseline 10/4: 36/56 11/29: 44/56    Time 8    Period Weeks    Status Achieved      Additional Long Term Goals   Additional Long Term Goals Yes      PT LONG TERM GOAL #6   Title Patient will increase Berg Balance score by > 6 points (50/56 ) to demonstrate decreased fall risk during functional activities.    Baseline 11/29: 44/56    Time 8    Period Weeks    Status New    Target Date 05/24/21      PT LONG TERM GOAL #7   Title Patient will report less than 7/10 pain in L knee with ambulation improving quality of life and reduced pain mobility.    Baseline 11/29; 10/10 pain with prolonged ambulation    Time 8    Period Weeks    Status New    Target Date 05/24/21                   Plan - 04/19/21 0825     Clinical Impression Statement Patients L knee pain limits session requiring multiple rest breaks and seated breaks. He is wearing his new brace today but pain continues to be present. Patient remains highly motivated throughout session despite his pain and fatigue. Balance tolerated well with occasional buckling of left knee. Patient will benefit from skilled physical therapy improve patient's ambulation capacity, mobility, strength, and independence with IADLs/ADLS    Personal Factors and Comorbidities Time since onset of injury/illness/exacerbation;Age;Comorbidity 3+;Education;Past/Current Experience;Fitness    Comorbidities PMH includes: Paroxysmal A-fib, COPD, DM  Type 2, CHF, OA of bilat knees, HTN, glaucoma of both eyes.    Examination-Activity Limitations Stairs;Squat;Stand;Locomotion Level    Examination-Participation Restrictions Community Activity    Stability/Clinical Decision Making Evolving/Moderate complexity    Rehab Potential Fair    PT Frequency 2x / week    PT Duration 8 weeks    PT Treatment/Interventions ADLs/Self Care Home Management;Cryotherapy;Gait training;Therapeutic exercise;Patient/family education;Manual techniques;Passive range of  motion;Spinal Manipulations;Dry needling;Aquatic Therapy;Electrical Stimulation;Stair training;Functional mobility training;Neuromuscular re-education;Therapeutic activities;Balance training;Energy conservation;Moist Heat    PT Next Visit Plan BP, strengthening    PT Home Exercise Plan Next session    Consulted and Agree with Plan of Care Patient             Patient will benefit from skilled therapeutic intervention in order to improve the following deficits and impairments:  Abnormal gait, Improper body mechanics, Decreased mobility, Decreased activity tolerance, Decreased balance, Decreased endurance, Decreased range of motion, Decreased strength, Difficulty walking, Decreased safety awareness, Impaired vision/preception  Visit Diagnosis: Abnormality of gait and mobility  Muscle weakness (generalized)  Difficulty in walking, not elsewhere classified     Problem List Patient Active Problem List   Diagnosis Date Noted   Bilateral primary osteoarthritis of knee 06/11/2019   Cervical radicular pain 06/11/2019   Cervical facet joint syndrome 06/11/2019   Lumbar facet arthropathy 06/11/2019   Lumbar radiculopathy 06/11/2019   Chronic pain syndrome 06/11/2019   Lung nodule 02/09/2016   Exercise hypoxemia 12/28/2015   Chronic obstructive pulmonary disease ([place]) 03/16/2015   Nuclear sclerosis 03/16/2015   OSA (obstructive sleep apnea) 03/16/2015   Primary open angle glaucoma of both eyes, severe stage 03/16/2015   Type 2 diabetes mellitus without complication, without long-term current use of insulin ([place]) 03/16/2015   Arthralgia of left lower leg 10/29/2014   Osteoarthritis of left knee 09/10/2014   Type 2 diabetes mellitus with diabetic neuropathy ([place]) 08/04/2014   Cataract, nuclear sclerotic senile 04/01/2014   Combined forms of age-related cataract 04/01/2014   Keratitis sicca, bilateral ([place]) 04/01/2014   Primary open angle glaucoma 02/27/2014   Flat feet 01/29/2014    Foot pain, bilateral 01/29/2014   Tinea pedis of both feet 01/29/2014   Ptosis of both eyelids 11/25/2013   Type 2 diabetes mellitus without retinopathy ([place]) 11/25/2013   Onychomycosis 92/44/6286   Chronic systolic heart failure ([place]) 11/11/2013   Emphysema of lung ([place]) 11/11/2013   Essential hypertension 11/11/2013   Screen for colon cancer 11/05/2013   Cardiomyopathy ([place]) 38/17/7116   Systolic HF (heart failure) ([place]) 57/90/3833   Acute systolic CHF (congestive heart failure) ([place]) 09/16/2013   COPD (chronic obstructive pulmonary disease) ([place]) 09/16/2013   HTN (hypertension) 09/16/2013   Obesity 09/16/2013   PAF (paroxysmal atrial fibrillation) ([place]) 09/16/2013   Senile nuclear sclerosis 05/22/2013   Muscle weakness (generalized) 11/11/2009   Long term current use of anticoagulant therapy 06/17/2009   Pulmonary embolism ([place]) 06/17/2009   ED (erectile dysfunction) of organic origin 05/13/2003    Janna Arch, PT, DPT  04/19/2021, 8:52 AM  [place] [facility]IN [facility] SERVICES [address] [place], [place], [address] Phone: [phone]   Fax:  [phone]  Name: Tristin Vandeusen MRN: [id number] Date of Birth: [birth year]

## 2021-04-21 ENCOUNTER — Other Ambulatory Visit: Payer: Self-pay

## 2021-04-21 ENCOUNTER — Ambulatory Visit: Payer: Medicare Other

## 2021-04-21 DIAGNOSIS — M6281 Muscle weakness (generalized): Secondary | ICD-10-CM

## 2021-04-21 DIAGNOSIS — R269 Unspecified abnormalities of gait and mobility: Secondary | ICD-10-CM

## 2021-04-21 DIAGNOSIS — R262 Difficulty in walking, not elsewhere classified: Secondary | ICD-10-CM

## 2021-04-21 NOTE — Therapy (Signed)
[place] MAIN [facility] SERVICES [address] [place], [place], [address] Phone: [phone]   Fax:  [phone]  Physical Therapy Treatment  Patient Details  Name: Mario [name] MRN: [id number] Date of Birth: 1946-03-15 Referring Provider (PT): [name], Victoriano Lain, NP   Encounter Date: 04/21/2021   PT End of Session - 04/21/21 0807     Visit Number 28    Number of Visits 39    Date for PT Re-Evaluation 05/24/21    Authorization Type 8/10 PN 11/15    PT Start Time 0759    PT Stop Time 0844    PT Time Calculation (min) 45 min    Equipment Utilized During Treatment Gait belt    Activity Tolerance Patient tolerated treatment well;Treatment limited secondary to medical complications (Comment);Other (comment)   Elevated BP readings            Past Medical History:  Diagnosis Date   Arthritis    hands, knees   Atrial fibrillation (HCC)    CHF (congestive heart failure) (HCC)    COPD (chronic obstructive pulmonary disease) (HCC)    Diabetes mellitus without complication (HCC)    Diabetic neuropathy (HCC)    Heart murmur    in past, mild 1969   Hypertension    resolved after weight loss and more activity   Neuromuscular disorder ([place])    neuropathy   Neuropathy    Pulmonary embolism ([place])    approx 2014   Shingles '99   Sleep apnea    CPAP   Wears dentures    full upper and lower    Past Surgical History:  Procedure Laterality Date   CATARACT EXTRACTION W/PHACO Left 05/12/2019   Procedure: CATARACT EXTRACTION PHACO AND INTRAOCULAR LENS PLACEMENT ([place]) LEFTDIABETIC ISTENT INJ 2.43  00:28.6;  Surgeon: Eulogio Bear, MD;  Location: [place];  Service: Ophthalmology;  Laterality: Left;  Diabetic - oral meds sleep apnea   CATARACT EXTRACTION W/PHACO Right 06/02/2019   Procedure: CATARACT EXTRACTION PHACO AND INTRAOCULAR LENS PLACEMENT ([facility]) ISTENT INJ  RIGHT DIABETIC 1.31  00:25.4;  Surgeon: Eulogio Bear, MD;   Location: [place];  Service: Ophthalmology;  Laterality: Right;   COLONOSCOPY  08/30/2015   FINGER SURGERY Right    3'rd finger, right hand   KNEE SURGERY Right    screw in knee   WRIST SURGERY Left    metal removed    There were no vitals filed for this visit.   Subjective Assessment - 04/21/21 0805     Subjective Patient had injection in L knee since last session reports his knee is burning and wants to take it easy today.  Goes back next wednesday for another injection and a brace for R knee.    Pertinent History Pt is a 75 y.o. male referred to OPPT for LE weakness, bilat join OA, and diabetic peripheral neuropathy. PMH includes: Paroxysmal A-fib, COPD, DM Type 2, CHF, OA of bilat knees, HTN, glaucoma of both eyes. Pt reports losing weight 265 lbs to 210 lbs and this is when he noticed he started having generalized weakness and knee pain. Has received steroid injections which temporarily helped (~1 year ago). Pt having difficulty in bowling for fun, walking to complete community tasks and ADL's (requires electric buggy for shopping). Reports walking ~ 12 meters. Pt does report NO falls besides slipping on lane with bowling. Has had near falls but has used walls to catch himself. Pt reports being on 2  L/min for COPD, denies any balance impairments. Pt's goals for PT are to return to bowling, and complete community walking tasks without having to take frequent seated rest breaks.    Limitations Lifting;Standing;Walking;House hold activities    How long can you sit comfortably? unlimited    How long can you stand comfortably? 3 minutes    How long can you walk comfortably? length of hallway in gym (10-12 m)    Patient Stated Goals Return to community ambulation.    Currently in Pain? Yes    Pain Score 4     Pain Location Knee    Pain Orientation Left    Pain Descriptors / Indicators Aching;Burning    Pain Type Chronic pain    Pain Onset More than a month ago    Pain Frequency  Intermittent                     Treatment: BP at start of session 118/84   TherEx Nustep Lvl 5 RMP> 70 for cardiovascular and musculoskeletal challenge 4 minutes Hamstring isometric pressing into green dynadisc 12x 3 second holds Seated: 3lb ankle weights LAQ 15x each LE 3 second holds  3lb ankle weight: seated upright without back support march 12x each LE  3lb ankle weight alternating heel toe raises 20x  BTB abduction 20x    Precor leg press bilateral LE"s #130 requires min A for placement of feet, cues for keeping knees from hyperextension 15x ; 2 sets; stabilization band to L knee for pain reduction ;    Precor leg press single LE #70 12x each LE ; stabilization to lateral aspect of L knee for pain reduction ; x2 sets   Balance master: for coordination, weight shift and spatial awareness x 6 minutes  Vitals monitored throughout session to ensure therapeutic range.    Pt educated throughout session about proper posture and technique with exercises. Improved exercise technique, movement at target joints, use of target muscles after min to mod verbal, visual, tactile cues.     Patient tolerates strengthening interventions with occasional rest breaks. His L knee pain is present through entire session as well as shortness of breath with cardio interventions. Patient remains highly motivated despite pain. Patient will benefit from skilled physical therapy improve patient's ambulation capacity, mobility, strength, and independence with IADLs/ADLS                  PT Education - 04/21/21 0807     Education Details exercise technique, body mechanics    Person(s) Educated Patient    Methods Explanation;Demonstration;Tactile cues;Verbal cues    Comprehension Verbalized understanding;Returned demonstration;Verbal cues required;Tactile cues required              PT Short Term Goals - 03/29/21 0855       PT SHORT TERM GOAL #1   Title Pt will be  independent with HEP to improve strength, balance, endurance to improve community ADL's.    Baseline 8/2: Initiate next session 9/13: intermittent compliance 10/4: intermittent compliance 11/29: HEP compliance, new HEP given    Time 4    Period Weeks    Status Partially Met    Target Date 04/26/21               PT Long Term Goals - 03/29/21 0856       PT LONG TERM GOAL #1   Title Pt will increase FOTO to target to demonstrate improved ability to complete functional mobility and ADL's.  Baseline 8/2: 48/64 9/13: 57% 10/4:  57% 11/29: 51%    Time 8    Period Weeks    Status Partially Met    Target Date 05/24/21      PT LONG TERM GOAL #2   Title Pt will improve 5xSTS time to < 10 sec to demonstrate clinically significant improvement in BLE strength.    Baseline 8/2: 13.8 sec 9/13: 12.41 seconds 10/4: 12.3 second arms crossed 11/29: 9.7 seconds without UE support    Time 8    Period Weeks    Status Achieved      PT LONG TERM GOAL #3   Title Pt will improve gait speed to at least 1.0 m/s to display evidenced norms to safely complete community wlaking tasks with decreased risk of falls.    Baseline 8/2: 0.678 m/s 9/13: 0.7 m/s 10/4: 0.87 m/s  11/29: 0.94 m/s with 8/10 knee pain    Time 8    Period Weeks    Status Partially Met    Target Date 05/24/21      PT LONG TERM GOAL #4   Title Pt will improve 6MWT to at least 1000' to demo ability to complete community distance walking tasks.    Baseline 8/2: Deferred due to BP readings. 8/4: 64 ft 9/13: 120 ft in 1 min 20 seconds 10/4: 200 ft with 2 min 58 seconds 11/29: 265 ft in 3 minutes and 3 seconds with 10/10 pain    Time 8    Period Weeks    Status Partially Met    Target Date 05/24/21      PT LONG TERM GOAL #5   Title Patient will increase Berg Balance score by > 6 points (42/56 ) to demonstrate decreased fall risk during functional activities.    Baseline 10/4: 36/56 11/29: 44/56    Time 8    Period Weeks    Status  Achieved      Additional Long Term Goals   Additional Long Term Goals Yes      PT LONG TERM GOAL #6   Title Patient will increase Berg Balance score by > 6 points (50/56 ) to demonstrate decreased fall risk during functional activities.    Baseline 11/29: 44/56    Time 8    Period Weeks    Status New    Target Date 05/24/21      PT LONG TERM GOAL #7   Title Patient will report less than 7/10 pain in L knee with ambulation improving quality of life and reduced pain mobility.    Baseline 11/29; 10/10 pain with prolonged ambulation    Time 8    Period Weeks    Status New    Target Date 05/24/21                   Plan - 04/21/21 0831     Clinical Impression Statement Patient tolerates strengthening interventions with occasional rest breaks. His L knee pain is present through entire session as well as shortness of breath with cardio interventions. Patient remains highly motivated despite pain. Patient will benefit from skilled physical therapy improve patient's ambulation capacity, mobility, strength, and independence with IADLs/ADLS    Personal Factors and Comorbidities Time since onset of injury/illness/exacerbation;Age;Comorbidity 3+;Education;Past/Current Experience;Fitness    Comorbidities PMH includes: Paroxysmal A-fib, COPD, DM Type 2, CHF, OA of bilat knees, HTN, glaucoma of both eyes.    Examination-Activity Limitations Stairs;Squat;Stand;Locomotion Level    Examination-Participation Restrictions Community Activity  Stability/Clinical Decision Making Evolving/Moderate complexity    Rehab Potential Fair    PT Frequency 2x / week    PT Duration 8 weeks    PT Treatment/Interventions ADLs/Self Care Home Management;Cryotherapy;Gait training;Therapeutic exercise;Patient/family education;Manual techniques;Passive range of motion;Spinal Manipulations;Dry needling;Aquatic Therapy;Electrical Stimulation;Stair training;Functional mobility training;Neuromuscular  re-education;Therapeutic activities;Balance training;Energy conservation;Moist Heat    PT Next Visit Plan BP, strengthening    PT Home Exercise Plan Next session    Consulted and Agree with Plan of Care Patient             Patient will benefit from skilled therapeutic intervention in order to improve the following deficits and impairments:  Abnormal gait, Improper body mechanics, Decreased mobility, Decreased activity tolerance, Decreased balance, Decreased endurance, Decreased range of motion, Decreased strength, Difficulty walking, Decreased safety awareness, Impaired vision/preception  Visit Diagnosis: Abnormality of gait and mobility  Muscle weakness (generalized)  Difficulty in walking, not elsewhere classified     Problem List Patient Active Problem List   Diagnosis Date Noted   Bilateral primary osteoarthritis of knee 06/11/2019   Cervical radicular pain 06/11/2019   Cervical facet joint syndrome 06/11/2019   Lumbar facet arthropathy 06/11/2019   Lumbar radiculopathy 06/11/2019   Chronic pain syndrome 06/11/2019   Lung nodule 02/09/2016   Exercise hypoxemia 12/28/2015   Chronic obstructive pulmonary disease ([place]) 03/16/2015   Nuclear sclerosis 03/16/2015   OSA (obstructive sleep apnea) 03/16/2015   Primary open angle glaucoma of both eyes, severe stage 03/16/2015   Type 2 diabetes mellitus without complication, without long-term current use of insulin ([place]) 03/16/2015   Arthralgia of left lower leg 10/29/2014   Osteoarthritis of left knee 09/10/2014   Type 2 diabetes mellitus with diabetic neuropathy ([place]) 08/04/2014   Cataract, nuclear sclerotic senile 04/01/2014   Combined forms of age-related cataract 04/01/2014   Keratitis sicca, bilateral ([place]) 04/01/2014   Primary open angle glaucoma 02/27/2014   Flat feet 01/29/2014   Foot pain, bilateral 01/29/2014   Tinea pedis of both feet 01/29/2014   Ptosis of both eyelids 11/25/2013   Type 2 diabetes mellitus  without retinopathy ([place]) 11/25/2013   Onychomycosis 50/56/9794   Chronic systolic heart failure ([place]) 11/11/2013   Emphysema of lung ([place]) 11/11/2013   Essential hypertension 11/11/2013   Screen for colon cancer 11/05/2013   Cardiomyopathy ([place]) 80/16/5537   Systolic HF (heart failure) ([place]) 48/27/0786   Acute systolic CHF (congestive heart failure) ([place]) 09/16/2013   COPD (chronic obstructive pulmonary disease) ([place]) 09/16/2013   HTN (hypertension) 09/16/2013   Obesity 09/16/2013   PAF (paroxysmal atrial fibrillation) ([place]) 09/16/2013   Senile nuclear sclerosis 05/22/2013   Muscle weakness (generalized) 11/11/2009   Long term current use of anticoagulant therapy 06/17/2009   Pulmonary embolism ([place]) 06/17/2009   ED (erectile dysfunction) of organic origin 05/13/2003   Janna Arch, PT, DPT  04/21/2021, 8:44 AM  [place] MAIN [facility] SERVICES [address] [place], [place], [address] Phone: [phone]   Fax:  [phone]  Name: Inman Fettig MRN: [id number] Date of Birth: 12/08/1945

## 2021-04-26 ENCOUNTER — Other Ambulatory Visit: Payer: Self-pay

## 2021-04-26 ENCOUNTER — Ambulatory Visit: Payer: Medicare Other | Admitting: Physical Therapy

## 2021-04-26 ENCOUNTER — Encounter: Payer: Self-pay | Admitting: Physical Therapy

## 2021-04-26 DIAGNOSIS — R269 Unspecified abnormalities of gait and mobility: Secondary | ICD-10-CM

## 2021-04-26 DIAGNOSIS — R262 Difficulty in walking, not elsewhere classified: Secondary | ICD-10-CM

## 2021-04-26 DIAGNOSIS — M6281 Muscle weakness (generalized): Secondary | ICD-10-CM

## 2021-04-26 NOTE — Therapy (Signed)
[place] MAIN [facility] SERVICES [address] [place], [place], [address] Phone: [phone]   Fax:  [phone]  Physical Therapy Treatment  Patient Details  Name: Mario [name] MRN: [id number] Date of Birth: [birth year] Referring Provider (PT): [name], Victoriano Lain, NP   Encounter Date: 04/26/2021   PT End of Session - 04/26/21 0832     Visit Number 29    Number of Visits 39    Date for PT Re-Evaluation 05/24/21    Authorization Type 8/10 PN 11/15    PT Start Time 0840    PT Stop Time 0925    PT Time Calculation (min) 45 min    Equipment Utilized During Treatment Gait belt    Activity Tolerance Patient tolerated treatment well;Treatment limited secondary to medical complications (Comment);Other (comment)   Elevated BP readings            Past Medical History:  Diagnosis Date   Arthritis    hands, knees   Atrial fibrillation (HCC)    CHF (congestive heart failure) (HCC)    COPD (chronic obstructive pulmonary disease) (HCC)    Diabetes mellitus without complication (HCC)    Diabetic neuropathy (HCC)    Heart murmur    in past, mild 1969   Hypertension    resolved after weight loss and more activity   Neuromuscular disorder ([place])    neuropathy   Neuropathy    Pulmonary embolism ([place])    approx 2014   Shingles '99   Sleep apnea    CPAP   Wears dentures    full upper and lower    Past Surgical History:  Procedure Laterality Date   CATARACT EXTRACTION W/PHACO Left 05/12/2019   Procedure: CATARACT EXTRACTION PHACO AND INTRAOCULAR LENS PLACEMENT ([place]) LEFTDIABETIC ISTENT INJ 2.43  00:28.6;  Surgeon: Eulogio Bear, MD;  Location: [place];  Service: Ophthalmology;  Laterality: Left;  Diabetic - oral meds sleep apnea   CATARACT EXTRACTION W/PHACO Right 06/02/2019   Procedure: CATARACT EXTRACTION PHACO AND INTRAOCULAR LENS PLACEMENT (IOC) ISTENT INJ  RIGHT DIABETIC 1.31  00:25.4;  Surgeon: Eulogio Bear, MD;   Location: [place];  Service: Ophthalmology;  Laterality: Right;   COLONOSCOPY  08/30/2015   FINGER SURGERY Right    3'rd finger, right hand   KNEE SURGERY Right    screw in knee   WRIST SURGERY Left    metal removed    There were no vitals filed for this visit.   Subjective Assessment - 04/26/21 0842     Subjective Patient reports no pain at rest. He reports continued knee pain when he gets up and moves around. He reports he often doesn't move around because of the pain.    Pertinent History Pt is a 75 y.o. male referred to OPPT for LE weakness, bilat join OA, and diabetic peripheral neuropathy. PMH includes: Paroxysmal A-fib, COPD, DM Type 2, CHF, OA of bilat knees, HTN, glaucoma of both eyes. Pt reports losing weight 265 lbs to 210 lbs and this is when he noticed he started having generalized weakness and knee pain. Has received steroid injections which temporarily helped (~1 year ago). Pt having difficulty in bowling for fun, walking to complete community tasks and ADL's (requires electric buggy for shopping). Reports walking ~ 12 meters. Pt does report NO falls besides slipping on lane with bowling. Has had near falls but has used walls to catch himself. Pt reports being on 2 L/min for COPD, denies any balance  impairments. Pt's goals for PT are to return to bowling, and complete community walking tasks without having to take frequent seated rest breaks.    Limitations Lifting;Standing;Walking;House hold activities    How long can you sit comfortably? unlimited    How long can you stand comfortably? 3 minutes    How long can you walk comfortably? length of hallway in gym (10-12 m)    Patient Stated Goals Return to community ambulation.    Currently in Pain? Yes    Pain Score 5     Pain Location Knee    Pain Orientation Left    Pain Descriptors / Indicators Aching    Pain Type Chronic pain    Pain Onset More than a month ago    Pain Frequency Intermittent    Aggravating  Factors  worse with standing/walking    Pain Relieving Factors rest    Effect of Pain on Daily Activities decreased activity tolerance;    Multiple Pain Sites No                  Treatment: BP at start of session 141/92, HR 76   TherEx Nustep Lvl 5, seat #10 RMP> 70 for cardiovascular and musculoskeletal challenge 4 minutes HR: 96bpm  SPo2:  95  % after exercise; Marked increase in shortness of breath   Hamstring isometric pressing into green dynadisc 12x 3 second holds Seated: 3lb ankle weights LAQ 15x each LE 3 second holds  3lb ankle weight: seated upright without back support march 15x each LE  3lb ankle weight alternating heel toe raises 20x  BTB abduction 20x    Precor leg press bilateral LE"s #130 requires min A for placement of feet, cues for keeping knees from hyperextension 15x ; x1 set with moderate difficulty reported Weight reduced to 100# x15 reps with better motor control and tolerance;  Patient unable to tolerate increased weight on leg press due to increased knee pain, likely from OA from cold weather;    Precor leg press single LE #70 10x each LE ; stabilization to lateral aspect of L knee for pain reduction ; x2 sets      Pt educated throughout session about proper posture and technique with exercises. Improved exercise technique, movement at target joints, use of target muscles after min to mod verbal, visual, tactile cues.       Patient tolerates strengthening interventions with occasional rest breaks. His L knee pain is present through entire session as well as shortness of breath with cardio interventions. Patient remains highly motivated despite pain. Patient will benefit from skilled physical therapy improve patient's ambulation capacity, mobility, strength, and independence with IADLs/ADLS                            PT Education - 04/26/21 0832     Education Details exercise technique/positioning;    Person(s) Educated  Patient    Methods Explanation;Verbal cues    Comprehension Verbalized understanding;Returned demonstration;Verbal cues required;Need further instruction              PT Short Term Goals - 03/29/21 0855       PT SHORT TERM GOAL #1   Title Pt will be independent with HEP to improve strength, balance, endurance to improve community ADL's.    Baseline 8/2: Initiate next session 9/13: intermittent compliance 10/4: intermittent compliance 11/29: HEP compliance, new HEP given    Time 4    Period Weeks  Status Partially Met    Target Date 04/26/21               PT Long Term Goals - 03/29/21 0856       PT LONG TERM GOAL #1   Title Pt will increase FOTO to target to demonstrate improved ability to complete functional mobility and ADL's.    Baseline 8/2: 48/64 9/13: 57% 10/4:  57% 11/29: 51%    Time 8    Period Weeks    Status Partially Met    Target Date 05/24/21      PT LONG TERM GOAL #2   Title Pt will improve 5xSTS time to < 10 sec to demonstrate clinically significant improvement in BLE strength.    Baseline 8/2: 13.8 sec 9/13: 12.41 seconds 10/4: 12.3 second arms crossed 11/29: 9.7 seconds without UE support    Time 8    Period Weeks    Status Achieved      PT LONG TERM GOAL #3   Title Pt will improve gait speed to at least 1.0 m/s to display evidenced norms to safely complete community wlaking tasks with decreased risk of falls.    Baseline 8/2: 0.678 m/s 9/13: 0.7 m/s 10/4: 0.87 m/s  11/29: 0.94 m/s with 8/10 knee pain    Time 8    Period Weeks    Status Partially Met    Target Date 05/24/21      PT LONG TERM GOAL #4   Title Pt will improve 6MWT to at least 1000' to demo ability to complete community distance walking tasks.    Baseline 8/2: Deferred due to BP readings. 8/4: 64 ft 9/13: 120 ft in 1 min 20 seconds 10/4: 200 ft with 2 min 58 seconds 11/29: 265 ft in 3 minutes and 3 seconds with 10/10 pain    Time 8    Period Weeks    Status Partially Met     Target Date 05/24/21      PT LONG TERM GOAL #5   Title Patient will increase Berg Balance score by > 6 points (42/56 ) to demonstrate decreased fall risk during functional activities.    Baseline 10/4: 36/56 11/29: 44/56    Time 8    Period Weeks    Status Achieved      Additional Long Term Goals   Additional Long Term Goals Yes      PT LONG TERM GOAL #6   Title Patient will increase Berg Balance score by > 6 points (50/56 ) to demonstrate decreased fall risk during functional activities.    Baseline 11/29: 44/56    Time 8    Period Weeks    Status New    Target Date 05/24/21      PT LONG TERM GOAL #7   Title Patient will report less than 7/10 pain in L knee with ambulation improving quality of life and reduced pain mobility.    Baseline 11/29; 10/10 pain with prolonged ambulation    Time 8    Period Weeks    Status New    Target Date 05/24/21                   Plan - 04/26/21 0850     Clinical Impression Statement Patient motivated and participated well within session. he does have intermittent knee pain which is worse with loading activities. Patient's vitals monitored throughout session. He is on 3L O2 during exercise. Patient does report increased shortness of breath with  activity, requiring short seated rest breaks. Instructed patient in advanced LE strengthening; He was educated in seated exercise for better tolerance. He does require min VCs for proper exercise technique for optimal muscle activation. He would benefit from additional skilled PT intervention to improve strength and mobility;    Personal Factors and Comorbidities Time since onset of injury/illness/exacerbation;Age;Comorbidity 3+;Education;Past/Current Experience;Fitness    Comorbidities PMH includes: Paroxysmal A-fib, COPD, DM Type 2, CHF, OA of bilat knees, HTN, glaucoma of both eyes.    Examination-Activity Limitations Stairs;Squat;Stand;Locomotion Level    Examination-Participation Restrictions  Community Activity    Stability/Clinical Decision Making Evolving/Moderate complexity    Rehab Potential Fair    PT Frequency 2x / week    PT Duration 8 weeks    PT Treatment/Interventions ADLs/Self Care Home Management;Cryotherapy;Gait training;Therapeutic exercise;Patient/family education;Manual techniques;Passive range of motion;Spinal Manipulations;Dry needling;Aquatic Therapy;Electrical Stimulation;Stair training;Functional mobility training;Neuromuscular re-education;Therapeutic activities;Balance training;Energy conservation;Moist Heat    PT Next Visit Plan BP, strengthening    PT Home Exercise Plan Next session    Consulted and Agree with Plan of Care Patient             Patient will benefit from skilled therapeutic intervention in order to improve the following deficits and impairments:  Abnormal gait, Improper body mechanics, Decreased mobility, Decreased activity tolerance, Decreased balance, Decreased endurance, Decreased range of motion, Decreased strength, Difficulty walking, Decreased safety awareness, Impaired vision/preception  Visit Diagnosis: Abnormality of gait and mobility  Muscle weakness (generalized)  Difficulty in walking, not elsewhere classified     Problem List Patient Active Problem List   Diagnosis Date Noted   Bilateral primary osteoarthritis of knee 06/11/2019   Cervical radicular pain 06/11/2019   Cervical facet joint syndrome 06/11/2019   Lumbar facet arthropathy 06/11/2019   Lumbar radiculopathy 06/11/2019   Chronic pain syndrome 06/11/2019   Lung nodule 02/09/2016   Exercise hypoxemia 12/28/2015   Chronic obstructive pulmonary disease ([place]) 03/16/2015   Nuclear sclerosis 03/16/2015   OSA (obstructive sleep apnea) 03/16/2015   Primary open angle glaucoma of both eyes, severe stage 03/16/2015   Type 2 diabetes mellitus without complication, without long-term current use of insulin ([place]) 03/16/2015   Arthralgia of left lower leg 10/29/2014    Osteoarthritis of left knee 09/10/2014   Type 2 diabetes mellitus with diabetic neuropathy ([place]) 08/04/2014   Cataract, nuclear sclerotic senile 04/01/2014   Combined forms of age-related cataract 04/01/2014   Keratitis sicca, bilateral ([place]) 04/01/2014   Primary open angle glaucoma 02/27/2014   Flat feet 01/29/2014   Foot pain, bilateral 01/29/2014   Tinea pedis of both feet 01/29/2014   Ptosis of both eyelids 11/25/2013   Type 2 diabetes mellitus without retinopathy ([place]) 11/25/2013   Onychomycosis 00/86/7619   Chronic systolic heart failure ([place]) 11/11/2013   Emphysema of lung ([place]) 11/11/2013   Essential hypertension 11/11/2013   Screen for colon cancer 11/05/2013   Cardiomyopathy ([place]) 50/93/2671   Systolic HF (heart failure) ([place]) 24/58/0998   Acute systolic CHF (congestive heart failure) ([place]) 09/16/2013   COPD (chronic obstructive pulmonary disease) ([place]) 09/16/2013   HTN (hypertension) 09/16/2013   Obesity 09/16/2013   PAF (paroxysmal atrial fibrillation) ([place]) 09/16/2013   Senile nuclear sclerosis 05/22/2013   Muscle weakness (generalized) 11/11/2009   Long term current use of anticoagulant therapy 06/17/2009   Pulmonary embolism ([place]) 06/17/2009   ED (erectile dysfunction) of organic origin 05/13/2003    Arabella Revelle, PT, DPT 04/26/2021, 9:24 AM  [place] [facility] [address]  [place], [place], [address] Phone: [phone]   Fax:  [phone]  Name: Jaxsyn Azam MRN: [id number] Date of Birth: 1946-03-14

## 2021-04-28 ENCOUNTER — Ambulatory Visit: Payer: Medicare Other

## 2021-05-03 ENCOUNTER — Ambulatory Visit: Payer: Medicare Other

## 2021-05-05 ENCOUNTER — Other Ambulatory Visit: Payer: Self-pay

## 2021-05-05 ENCOUNTER — Ambulatory Visit: Payer: Medicare Other

## 2021-05-05 ENCOUNTER — Ambulatory Visit
Admission: RE | Admit: 2021-05-05 | Discharge: 2021-05-05 | Disposition: A | Discharge status: Home / Self Care | Payer: Commercial Managed Care - HMO | Source: Ambulatory Visit | Attending: Nurse Practitioner | Admitting: Nurse Practitioner

## 2021-05-05 VITALS — BP 118/73 | HR 81 | Temp 98.2°F | Resp 16 | Ht 69.5 in | Wt 219.8 lb

## 2021-05-05 DIAGNOSIS — R21 Rash and other nonspecific skin eruption: Secondary | ICD-10-CM | POA: Diagnosis not present

## 2021-05-05 DIAGNOSIS — M170 Bilateral primary osteoarthritis of knee: Secondary | ICD-10-CM

## 2021-05-05 NOTE — Discharge Instructions (Addendum)
Follow up with the following doctors:  [facility] [address] [place], [place], [address] [phone]   [facility] [place] [phone] [place] [address] [place] [address]   Dr Newton Pigg [facility] [address] [place], [place] [address] [phone]

## 2021-05-05 NOTE — ED Triage Notes (Signed)
Pt c/o of bilateral knee pain after going to flexogenix. Pt overall is not feeling well, sxs started months ago, fatigue and shortness of breath. Pt also c/o of sores on legs. Pt is diabetic, has copd, and heart condition.

## 2021-05-05 NOTE — ED Provider Notes (Signed)
[facility]    CSN: [id number] Arrival date & time: 05/05/21  0944      History   Chief Complaint Chief Complaint  Patient presents with   Knee Pain    10pm APPOINMENT    HPI Mario [name] is a 76 y.o. male who presents with worse pain on his knees after getting second flexogenic injection. The first one helped. He had been told to ice areas after procedure, but has not done int. Also not been wearing his offloading knee braces. Has rash on lower legs and derm told him has eczema and prescribed a topical med, but wants to see other dermatologist  Also would like information about getting a new Endocrinologist.     Past Medical History:  Diagnosis Date   Arthritis    hands, knees   Atrial fibrillation ([place])    CHF (congestive heart failure) ([place])    COPD (chronic obstructive pulmonary disease) ([place])    Diabetes mellitus without complication ([place])    Diabetic neuropathy ([place])    Heart murmur    in past, mild 1969   Hypertension    resolved after weight loss and more activity   Neuromuscular disorder ([place])    neuropathy   Neuropathy    Pulmonary embolism ([place])    approx 2014   Shingles '99   Sleep apnea    CPAP   Wears dentures    full upper and lower    Patient Active Problem List   Diagnosis Date Noted   Bilateral primary osteoarthritis of knee 06/11/2019   Cervical radicular pain 06/11/2019   Cervical facet joint syndrome 06/11/2019   Lumbar facet arthropathy 06/11/2019   Lumbar radiculopathy 06/11/2019   Chronic pain syndrome 06/11/2019   Lung nodule 02/09/2016   Exercise hypoxemia 12/28/2015   Chronic obstructive pulmonary disease ([place]) 03/16/2015   Nuclear sclerosis 03/16/2015   OSA (obstructive sleep apnea) 03/16/2015   Primary open angle glaucoma of both eyes, severe stage 03/16/2015   Type 2 diabetes mellitus without complication, without long-term current use of insulin ([place]) 03/16/2015   Arthralgia of left lower leg 10/29/2014    Osteoarthritis of left knee 09/10/2014   Type 2 diabetes mellitus with diabetic neuropathy ([place]) 08/04/2014   Cataract, nuclear sclerotic senile 04/01/2014   Combined forms of age-related cataract 04/01/2014   Keratitis sicca, bilateral ([place]) 04/01/2014   Primary open angle glaucoma 02/27/2014   Flat feet 01/29/2014   Foot pain, bilateral 01/29/2014   Tinea pedis of both feet 01/29/2014   Ptosis of both eyelids 11/25/2013   Type 2 diabetes mellitus without retinopathy ([place]) 11/25/2013   Onychomycosis 123456   Chronic systolic heart failure ([place]) 11/11/2013   Emphysema of lung ([place]) 11/11/2013   Essential hypertension 11/11/2013   Screen for colon cancer 11/05/2013   Cardiomyopathy ([place]) 123XX123   Systolic HF (heart failure) ([place]) 123XX123   Acute systolic CHF (congestive heart failure) ([place]) 09/16/2013   COPD (chronic obstructive pulmonary disease) ([place]) 09/16/2013   HTN (hypertension) 09/16/2013   Obesity 09/16/2013   PAF (paroxysmal atrial fibrillation) ([place]) 09/16/2013   Senile nuclear sclerosis 05/22/2013   Muscle weakness (generalized) 11/11/2009   Long term current use of anticoagulant therapy 06/17/2009   Pulmonary embolism ([place]) 06/17/2009   ED (erectile dysfunction) of organic origin 05/13/2003    Past Surgical History:  Procedure Laterality Date   CATARACT EXTRACTION W/PHACO Left 05/12/2019   Procedure: CATARACT EXTRACTION PHACO AND INTRAOCULAR LENS PLACEMENT (IOC) LEFTDIABETIC ISTENT INJ 2.43  00:28.6;  Surgeon:  [place] Crane, MD;  Location: [facility];  Service: Ophthalmology;  Laterality: Left;  Diabetic - oral meds sleep apnea   CATARACT EXTRACTION W/PHACO Right 06/02/2019   Procedure: CATARACT EXTRACTION PHACO AND INTRAOCULAR LENS PLACEMENT (IOC) ISTENT INJ  RIGHT DIABETIC 1.31  00:25.4;  Surgeon: [place] Crane, MD;  Location[facility];  Service: Ophthalmology;  Laterality: Right;   COLONOSCOPY  08/30/2015   FINGER SURGERY Right     3'rd finger, right hand   KNEE SURGERY Right    screw in knee   WRIST SURGERY Left    metal removed       Home Medications    Prior to Admission medications   Medication Sig Start Date End Date Taking? Authorizing Provider  albuterol (PROVENTIL HFA;VENTOLIN HFA) 108 (90 Base) MCG/ACT inhaler Inhale 2 puffs into the lungs as needed. 11/23/15   [provider]  APPLE CIDER VINEGAR PO Take by mouth daily.    [provider]  celecoxib (CELEBREX) 200 MG capsule  11/17/18   [provider]  DULoxetine (CYMBALTA) 60 MG capsule am 11/10/18   [provider]  edoxaban (SAVAYSA) 60 MG TABS tablet Take 60 mg by mouth daily. cardiology 07/02/15   [provider]  Fluticasone-Umeclidin-Vilant (TRELEGY ELLIPTA IN) Inhale into the lungs daily.    [provider]  furosemide (LASIX) 80 MG tablet Take 1 tablet by mouth 2 (two) times daily. 80 mg AM, 120 mg PM 08/23/15   [provider]  gabapentin (NEURONTIN) 300 MG capsule Take 800 mg by mouth 3 (three) times daily.  07/17/15   [provider]  gabapentin (NEURONTIN) 800 MG tablet Take 800 mg by mouth 3 (three) times daily. 06/08/19   [provider]  glucose blood (ONE TOUCH ULTRA TEST) test strip Use as instructed FOR TESTING three times daily.  E11.9 06/01/15   [provider]  ipratropium-albuterol (DUONEB) 0.5-2.5 (3) MG/3ML SOLN Inhale into the lungs. 12/28/15   [provider]  metFORMIN (GLUCOPHAGE) 1000 MG tablet Take 1 tablet by mouth 2 (two) times daily. 08/12/15   [provider]  metFORMIN (GLUCOPHAGE) 500 MG tablet  06/08/19   [provider]  OZEMPIC, 1 MG/DOSE, 2 MG/1.5ML SOPN  11/20/18   [provider]  ROCKLATAN 0.02-0.005 % SOLN Apply 1 drop to eye at bedtime. 06/20/19   [provider]  sotalol (BETAPACE) 80 MG tablet Take 1 tablet by mouth 2 (two) times daily. 08/25/14   [provider]  SOTALOL AF 80  MG TABS Take by mouth 2 (two) times daily. Breakfast and dinner    [provider]  traMADol (ULTRAM) 50 MG tablet Take by mouth 3 (three) times daily.    [provider]  TURMERIC PO Take by mouth daily.    [provider]  losartan (COZAAR) 50 MG tablet Take 1 tablet by mouth daily. 07/05/15 11/29/18  [provider]    Family History No family history on file.  Social History Social History   Tobacco Use   Smoking status: Former    Packs/day: 0.50    Years: 1.00    Pack years: 0.50    Types: Cigarettes    Quit date: 05/01/1969    Years since quitting: 52.0   Smokeless tobacco: Never   Tobacco comments:    Quit 1971  Vaping Use   Vaping Use: Never used  Substance Use Topics   Alcohol use: No   Drug use: No  Allergies   Rosuvastatin, Ace inhibitors, Lisinopril, and Simbrinza [brinzolamide-brimonidine]   Review of Systems Review of Systems  Musculoskeletal:  Positive for arthralgias and joint swelling. Negative for gait problem.  Skin:  Positive for color change and rash. Negative for pallor and wound.    Physical Exam Triage Vital Signs ED Triage Vitals  Enc Vitals Group     BP 05/05/21 1003 118/73     Pulse Rate 05/05/21 1003 81     Resp 05/05/21 0957 16     Temp 05/05/21 1003 98.2 F (36.8 C)     Temp Source 05/05/21 0957 Oral     SpO2 05/05/21 1003 95 %     Weight 05/05/21 0956 219 lb 12.8 oz (99.7 kg)     Height 05/05/21 0956 5' 9.5" (1.765 m)     Head Circumference --      Peak Flow --      Pain Score --      Pain Loc --      Pain Edu? --      Excl. in [place]? --    No data found.  Updated Vital Signs BP 118/73 (BP Location: Right Arm)    Pulse 81    Temp 98.2 F (36.8 C) (Oral)    Resp 16    Ht 5' 9.5" (1.765 m)    Wt 219 lb 12.8 oz (99.7 kg)    SpO2 95%    BMI 31.99 kg/m   Visual Acuity Right Eye Distance:   Left Eye Distance:   Bilateral Distance:    Right Eye Near:   Left Eye Near:    Bilateral Near:      Physical Exam Vitals and nursing note reviewed.  Constitutional:      General: He is not in acute distress.    Appearance: He is obese. He is not toxic-appearing.  HENT:     Right Ear: External ear normal.     Left Ear: External ear normal.  Eyes:     General: No scleral icterus.    Conjunctiva/sclera: Conjunctivae normal.  Pulmonary:     Effort: Pulmonary effort is normal.  Musculoskeletal:        General: Normal range of motion.     Cervical back: Neck supple.     Right lower leg: No edema.     Left lower leg: No edema.  Skin:    General: Skin is warm and dry.     Findings: Rash present. No bruising or erythema.     Comments: With 2 scars on his kness lateral and medial, this joint is larger than the L. Has lateral joint line tenderness and mild effusion. Not hot or red.  Has multiple hyperpigmented dry skin patches on legs and behind his ears where the O2 tubing touches.   Neurological:     Mental Status: He is alert and oriented to person, place, and time.  Psychiatric:        Mood and Affect: Mood normal.        Behavior: Behavior normal.        Thought Content: Thought content normal.        Judgment: Judgment normal.     UC Treatments / Results  Labs (all labs ordered are listed, but only abnormal results are displayed) Labs Reviewed - No data to display  EKG   Radiology No results found.  Procedures Procedures (including critical care time)  Medications Ordered in UC Medications - No data to display  Initial  Impression / Assessment and Plan / UC Course  I have reviewed the triage vital signs and the nursing notes. OA both knees with persistent pain- needs to wear his knee brace and ice knees after procedure as told by specialist Possible psoriasis- needs to FU with derm He was given names of specialist he wants to see, but explained since he has medicaid, his PCP has to do the referral or he will have to pay cash if visits are not authorized.  Final  Clinical Impressions(s) / UC Diagnoses   Final diagnoses:  Primary osteoarthritis of both knees  Rash and nonspecific skin eruption     Discharge Instructions      Follow up with the following doctors:  [facility] [address] Josefina Do [place], [place], [address] [phone]   [facility] and [place] at [place], [place] [address]   Dr Gavin Pound Endocrinology [place], [place] [address] [phone]    ED Prescriptions   None    PDMP not reviewed this encounter.   Shelby Mattocks, [place] 05/05/21 1951

## 2021-05-10 ENCOUNTER — Other Ambulatory Visit: Payer: Self-pay

## 2021-05-10 ENCOUNTER — Ambulatory Visit: Payer: Medicare Other | Attending: Nurse Practitioner

## 2021-05-10 DIAGNOSIS — R269 Unspecified abnormalities of gait and mobility: Secondary | ICD-10-CM | POA: Diagnosis present

## 2021-05-10 DIAGNOSIS — M6281 Muscle weakness (generalized): Secondary | ICD-10-CM | POA: Insufficient documentation

## 2021-05-10 DIAGNOSIS — R262 Difficulty in walking, not elsewhere classified: Secondary | ICD-10-CM | POA: Diagnosis present

## 2021-05-10 NOTE — Therapy (Signed)
[place] MAIN [facility] SERVICES [address] [place], [place], [address] Phone: [phone]   Fax:  [phone]  Physical Therapy Treatment/ Physical Therapy Progress Note   Dates of reporting period  03/15/21   to   05/10/21    Patient Details  Name: Mario [name] MRN: [id number] Date of Birth: [birth year] Referring Provider (PT): [name], Victoriano Lain, NP   Encounter Date: 05/10/2021   PT End of Session - 05/10/21 0834     Visit Number 30    Number of Visits 39    Date for PT Re-Evaluation 05/24/21    Authorization Type 10/10 PN 11/15; next session 1/10 PN 05/10/21    PT Start Time 0813    PT Stop Time 0845    PT Time Calculation (min) 32 min    Equipment Utilized During Treatment Gait belt    Activity Tolerance Patient tolerated treatment well;Treatment limited secondary to medical complications (Comment);Other (comment)   Elevated BP readings            Past Medical History:  Diagnosis Date   Arthritis    hands, knees   Atrial fibrillation (HCC)    CHF (congestive heart failure) (HCC)    COPD (chronic obstructive pulmonary disease) (HCC)    Diabetes mellitus without complication (HCC)    Diabetic neuropathy (HCC)    Heart murmur    in past, mild 1969   Hypertension    resolved after weight loss and more activity   Neuromuscular disorder ([place])    neuropathy   Neuropathy    Pulmonary embolism ([place])    approx 2014   Shingles '99   Sleep apnea    CPAP   Wears dentures    full upper and lower    Past Surgical History:  Procedure Laterality Date   CATARACT EXTRACTION W/PHACO Left 05/12/2019   Procedure: CATARACT EXTRACTION PHACO AND INTRAOCULAR LENS PLACEMENT ([place]) LEFTDIABETIC ISTENT INJ 2.43  00:28.6;  Surgeon: Eulogio Bear, MD;  Location: [facility];  Service: Ophthalmology;  Laterality: Left;  Diabetic - oral meds sleep apnea   CATARACT EXTRACTION W/PHACO Right 06/02/2019   Procedure: CATARACT EXTRACTION  PHACO AND INTRAOCULAR LENS PLACEMENT ([facility]) ISTENT INJ  RIGHT DIABETIC 1.31  00:25.4;  Surgeon: Eulogio Bear, MD;  Location: [facility];  Service: Ophthalmology;  Laterality: Right;   COLONOSCOPY  08/30/2015   FINGER SURGERY Right    3'rd finger, right hand   KNEE SURGERY Right    screw in knee   WRIST SURGERY Left    metal removed    There were no vitals filed for this visit.   Subjective Assessment - 05/10/21 0837     Subjective Patient returning to physical therapy after over two week absence. Went to the ED s/p L knee injection.    Pertinent History Pt is a 76 y.o. male referred to OPPT for LE weakness, bilat join OA, and diabetic peripheral neuropathy. PMH includes: Paroxysmal A-fib, COPD, DM Type 2, CHF, OA of bilat knees, HTN, glaucoma of both eyes. Pt reports losing weight 265 lbs to 210 lbs and this is when he noticed he started having generalized weakness and knee pain. Has received steroid injections which temporarily helped (~1 year ago). Pt having difficulty in bowling for fun, walking to complete community tasks and ADL's (requires electric buggy for shopping). Reports walking ~ 12 meters. Pt does report NO falls besides slipping on lane with bowling. Has had near falls but has used walls  to catch himself. Pt reports being on 2 L/min for COPD, denies any balance impairments. Pt's goals for PT are to return to bowling, and complete community walking tasks without having to take frequent seated rest breaks.    Limitations Lifting;Standing;Walking;House hold activities    How long can you sit comfortably? unlimited    How long can you stand comfortably? 3 minutes    How long can you walk comfortably? length of hallway in gym (10-12 m)    Patient Stated Goals Return to community ambulation.    Currently in Pain? Yes    Pain Score 8     Pain Location Knee    Pain Orientation Left;Right    Pain Descriptors / Indicators Aching    Pain Type Chronic pain    Pain Onset  More than a month ago    Pain Frequency Intermittent             Patient returning to physical therapy after over two week absence. Went to the ED s/p L knee injection.   BP at start of session: 138/91    Goals:  FOTO: 51%  10 MWT: 13.6 sec ; no brace on L knee =0.75 m/s  6 min walk test BERG: 43/56  VAS: 10/10  Has not been seen since 04/26/21.   Treatment:  Precor leg press bilateral LE"s #130 requires min A for placement of feet, cues for keeping knees from hyperextension 12x ; 3 sets; stabilization band to L knee for pain reduction ;     Patient's condition has the potential to improve in response to therapy. Maximum improvement is yet to be obtained. The anticipated improvement is attainable and reasonable in a generally predictable time.  Patient reports he wants to be able to walk without pain.      Patient has regression in goals potentially due to absence from therapy and increased knee pain indicating need for continued focused skilled therapy. Patient has extreme knee pain that limits session. Patient educated on need for attendance for improvement in mobility. Patient's condition has the potential to improve in response to therapy. Maximum improvement is yet to be obtained. The anticipated improvement is attainable and reasonable in a generally predictable time. Patient will benefit from skilled physical therapy improve patient's ambulation capacity, mobility, strength, and independence with IADLs/ADLS                   PT Education - 05/10/21 0834     Education Details goals, POC , knee pain    Person(s) Educated Patient    Methods Explanation;Demonstration;Tactile cues;Verbal cues    Comprehension Verbalized understanding;Returned demonstration;Verbal cues required;Tactile cues required              PT Short Term Goals - 03/29/21 0855       PT SHORT TERM GOAL #1   Title Pt will be independent with HEP to improve strength, balance,  endurance to improve community ADL's.    Baseline 8/2: Initiate next session 9/13: intermittent compliance 10/4: intermittent compliance 11/29: HEP compliance, new HEP given    Time 4    Period Weeks    Status Partially Met    Target Date 04/26/21               PT Long Term Goals - 05/10/21 1320       PT LONG TERM GOAL #1   Title Pt will increase FOTO to target to demonstrate improved ability to complete functional mobility and ADL's.  Baseline 8/2: 48/64 9/13: 57% 10/4:  57% 11/29: 51% 1/10: 51%    Time 8    Period Weeks    Status Partially Met    Target Date 05/24/21      PT LONG TERM GOAL #2   Title Pt will improve 5xSTS time to < 10 sec to demonstrate clinically significant improvement in BLE strength.    Baseline 8/2: 13.8 sec 9/13: 12.41 seconds 10/4: 12.3 second arms crossed 11/29: 9.7 seconds without UE support    Time 8    Period Weeks    Status Achieved      PT LONG TERM GOAL #3   Title Pt will improve gait speed to at least 1.0 m/s to display evidenced norms to safely complete community wlaking tasks with decreased risk of falls.    Baseline 8/2: 0.678 m/s 9/13: 0.7 m/s 10/4: 0.87 m/s  11/29: 0.94 m/s with 8/10 knee pain 1/10: 0.75 m/s    Time 8    Period Weeks    Status Partially Met    Target Date 05/24/21      PT LONG TERM GOAL #4   Title Pt will improve 6MWT to at least 1000' to demo ability to complete community distance walking tasks.    Baseline 8/2: Deferred due to BP readings. 8/4: 64 ft 9/13: 120 ft in 1 min 20 seconds 10/4: 200 ft with 2 min 58 seconds 11/29: 265 ft in 3 minutes and 3 seconds with 10/10 pain 1/10: deferred due to pain    Time 8    Period Weeks    Status Partially Met    Target Date 05/24/21      PT LONG TERM GOAL #5   Title Patient will increase Berg Balance score by > 6 points (42/56 ) to demonstrate decreased fall risk during functional activities.    Baseline 10/4: 36/56 11/29: 44/56    Time 8    Period Weeks    Status  Achieved      PT LONG TERM GOAL #6   Title Patient will increase Berg Balance score by > 6 points (50/56 ) to demonstrate decreased fall risk during functional activities.    Baseline 11/29: 44/56 1/10: 43/56    Time 8    Period Weeks    Status On-going    Target Date 05/24/21      PT LONG TERM GOAL #7   Title Patient will report less than 7/10 pain in L knee with ambulation improving quality of life and reduced pain mobility.    Baseline 11/29; 10/10 pain with prolonged ambulation 1/10: 10/10 with ambulation    Time 8    Period Weeks    Status On-going    Target Date 05/24/21                   Plan - 05/10/21 1319     Clinical Impression Statement Patient has regression in goals potentially due to absence from therapy and increased knee pain indicating need for continued focused skilled therapy. Patient has extreme knee pain that limits session. Patient educated on need for attendance for improvement in mobility. Patient's condition has the potential to improve in response to therapy. Maximum improvement is yet to be obtained. The anticipated improvement is attainable and reasonable in a generally predictable time. Patient will benefit from skilled physical therapy improve patient's ambulation capacity, mobility, strength, and independence with IADLs/ADLS    Personal Factors and Comorbidities Time since onset of injury/illness/exacerbation;Age;Comorbidity 3+;Education;Past/Current Experience;Fitness  Comorbidities PMH includes: Paroxysmal A-fib, COPD, DM Type 2, CHF, OA of bilat knees, HTN, glaucoma of both eyes.    Examination-Activity Limitations Stairs;Squat;Stand;Locomotion Level    Examination-Participation Restrictions Community Activity    Stability/Clinical Decision Making Evolving/Moderate complexity    Rehab Potential Fair    PT Frequency 2x / week    PT Duration 8 weeks    PT Treatment/Interventions ADLs/Self Care Home Management;Cryotherapy;Gait  training;Therapeutic exercise;Patient/family education;Manual techniques;Passive range of motion;Spinal Manipulations;Dry needling;Aquatic Therapy;Electrical Stimulation;Stair training;Functional mobility training;Neuromuscular re-education;Therapeutic activities;Balance training;Energy conservation;Moist Heat    PT Next Visit Plan BP, strengthening    PT Home Exercise Plan Next session    Consulted and Agree with Plan of Care Patient             Patient will benefit from skilled therapeutic intervention in order to improve the following deficits and impairments:  Abnormal gait, Improper body mechanics, Decreased mobility, Decreased activity tolerance, Decreased balance, Decreased endurance, Decreased range of motion, Decreased strength, Difficulty walking, Decreased safety awareness, Impaired vision/preception  Visit Diagnosis: Abnormality of gait and mobility  Muscle weakness (generalized)  Difficulty in walking, not elsewhere classified     Problem List Patient Active Problem List   Diagnosis Date Noted   Bilateral primary osteoarthritis of knee 06/11/2019   Cervical radicular pain 06/11/2019   Cervical facet joint syndrome 06/11/2019   Lumbar facet arthropathy 06/11/2019   Lumbar radiculopathy 06/11/2019   Chronic pain syndrome 06/11/2019   Lung nodule 02/09/2016   Exercise hypoxemia 12/28/2015   Chronic obstructive pulmonary disease ([place]) 03/16/2015   Nuclear sclerosis 03/16/2015   OSA (obstructive sleep apnea) 03/16/2015   Primary open angle glaucoma of both eyes, severe stage 03/16/2015   Type 2 diabetes mellitus without complication, without long-term current use of insulin ([place]) 03/16/2015   Arthralgia of left lower leg 10/29/2014   Osteoarthritis of left knee 09/10/2014   Type 2 diabetes mellitus with diabetic neuropathy ([place]) 08/04/2014   Cataract, nuclear sclerotic senile 04/01/2014   Combined forms of age-related cataract 04/01/2014   Keratitis sicca,  bilateral ([place]) 04/01/2014   Primary open angle glaucoma 02/27/2014   Flat feet 01/29/2014   Foot pain, bilateral 01/29/2014   Tinea pedis of both feet 01/29/2014   Ptosis of both eyelids 11/25/2013   Type 2 diabetes mellitus without retinopathy ([place]) 11/25/2013   Onychomycosis 69/62/9528   Chronic systolic heart failure ([place]) 11/11/2013   Emphysema of lung ([place]) 11/11/2013   Essential hypertension 11/11/2013   Screen for colon cancer 11/05/2013   Cardiomyopathy ([place]) 41/32/4401   Systolic HF (heart failure) ([place]) 02/72/5366   Acute systolic CHF (congestive heart failure) ([place]) 09/16/2013   COPD (chronic obstructive pulmonary disease) ([place]) 09/16/2013   HTN (hypertension) 09/16/2013   Obesity 09/16/2013   PAF (paroxysmal atrial fibrillation) ([place]) 09/16/2013   Senile nuclear sclerosis 05/22/2013   Muscle weakness (generalized) 11/11/2009   Long term current use of anticoagulant therapy 06/17/2009   Pulmonary embolism ([place]) 06/17/2009   ED (erectile dysfunction) of organic origin 05/13/2003    Janna Arch, PT, DPT  05/10/2021, 1:22 PM  [place] MAIN [facility] SERVICES [address] [place], [place], [address] Phone: [phone]   Fax:  [phone]  Name: Mario [name] MRN: [id number] Date of Birth: [birth year]

## 2021-05-12 ENCOUNTER — Ambulatory Visit: Payer: Medicare Other | Admitting: Physical Therapy

## 2021-05-12 ENCOUNTER — Other Ambulatory Visit: Payer: Self-pay

## 2021-05-12 DIAGNOSIS — R262 Difficulty in walking, not elsewhere classified: Secondary | ICD-10-CM

## 2021-05-12 DIAGNOSIS — R269 Unspecified abnormalities of gait and mobility: Secondary | ICD-10-CM

## 2021-05-12 DIAGNOSIS — M6281 Muscle weakness (generalized): Secondary | ICD-10-CM

## 2021-05-12 NOTE — Therapy (Signed)
[place] MAIN [facility] SERVICES [address] [place], [place], [address] Phone: [phone]   Fax:  [phone]  Physical Therapy Treatment  Patient Details  Name: Mario [name] MRN: [id number] Date of Birth: 21-Dec-1945 Referring Provider (PT): [name], Victoriano Lain, NP   Encounter Date: 05/12/2021   PT End of Session - 05/12/21 1224     Visit Number 31    Number of Visits 39    Date for PT Re-Evaluation 05/24/21    Authorization Type 10/10 PN 11/15; next session 1/10 PN 05/10/21    PT Start Time 0803    PT Stop Time 0845    PT Time Calculation (min) 42 min    Equipment Utilized During Treatment Gait belt;Oxygen    Activity Tolerance Patient tolerated treatment well   Elevated BP readings            Past Medical History:  Diagnosis Date   Arthritis    hands, knees   Atrial fibrillation (HCC)    CHF (congestive heart failure) (HCC)    COPD (chronic obstructive pulmonary disease) (HCC)    Diabetes mellitus without complication (HCC)    Diabetic neuropathy (HCC)    Heart murmur    in past, mild 1969   Hypertension    resolved after weight loss and more activity   Neuromuscular disorder ([place])    neuropathy   Neuropathy    Pulmonary embolism ([place])    approx 2014   Shingles '99   Sleep apnea    CPAP   Wears dentures    full upper and lower    Past Surgical History:  Procedure Laterality Date   CATARACT EXTRACTION W/PHACO Left 05/12/2019   Procedure: CATARACT EXTRACTION PHACO AND INTRAOCULAR LENS PLACEMENT ([place]) LEFTDIABETIC ISTENT INJ 2.43  00:28.6;  Surgeon: Eulogio Bear, MD;  Location: [place];  Service: Ophthalmology;  Laterality: Left;  Diabetic - oral meds sleep apnea   CATARACT EXTRACTION W/PHACO Right 06/02/2019   Procedure: CATARACT EXTRACTION PHACO AND INTRAOCULAR LENS PLACEMENT ([facility]) ISTENT INJ  RIGHT DIABETIC 1.31  00:25.4;  Surgeon: Eulogio Bear, MD;  Location: [place];  Service:  Ophthalmology;  Laterality: Right;   COLONOSCOPY  08/30/2015   FINGER SURGERY Right    3'rd finger, right hand   KNEE SURGERY Right    screw in knee   WRIST SURGERY Left    metal removed    There were no vitals filed for this visit.   Subjective Assessment - 05/12/21 0814     Subjective Pt states he is well today. Reports no change in status and has been compliant with HEP. Endorses pain in L knee    Pertinent History Pt is a 76 y.o. male referred to OPPT for LE weakness, bilat join OA, and diabetic peripheral neuropathy. PMH includes: Paroxysmal A-fib, COPD, DM Type 2, CHF, OA of bilat knees, HTN, glaucoma of both eyes. Pt reports losing weight 265 lbs to 210 lbs and this is when he noticed he started having generalized weakness and knee pain. Has received steroid injections which temporarily helped (~1 year ago). Pt having difficulty in bowling for fun, walking to complete community tasks and ADL's (requires electric buggy for shopping). Reports walking ~ 12 meters. Pt does report NO falls besides slipping on lane with bowling. Has had near falls but has used walls to catch himself. Pt reports being on 2 L/min for COPD, denies any balance impairments. Pt's goals for PT are to return to  bowling, and complete community walking tasks without having to take frequent seated rest breaks.    Limitations Lifting;Standing;Walking;House hold activities    How long can you sit comfortably? unlimited    How long can you stand comfortably? 3 minutes    How long can you walk comfortably? length of hallway in gym (10-12 m)    Patient Stated Goals Return to community ambulation.    Currently in Pain? Yes    Pain Score 4     Pain Location Knee    Pain Orientation Left    Pain Descriptors / Indicators Aching    Pain Type Chronic pain                Treatment: BP at start of session 134/86, HR 58   TherEx Nustep Lvl 5, seat #10 RPM >70 for cardiovascular and musculoskeletal challenge, 4  minutes HR: 92bpm  SPo2: 94% after exercise; SOB and fatigue    Seated: Hamstring isometric pressing into green dynadisc 12x 3 second holds  3lb ankle weights LAQ 2 x 12 each LE 3 second holds  3lb ankle weight: seated upright without back support march 2 x 12 each LE  3lb ankle weight alternating heel toe raises 30x  BTB abduction 30x    Attempted leg press - pt continuing to report machine was broken. PT demonstrated machine is working. Pt then stating he was done with the session.     Pt educated throughout session about proper posture and technique with exercises. Improved exercise technique, movement at target joints, use of target muscles after min to mod verbal, visual, tactile cues.       POC was continued with LE strengthening and endurance. Rest breaks taken due to pt fatigue and significant pain increases in L knee. Vitals stable at beginning of session and after NuStep. He remained motivated through most of session until attempting to finish session on leg press - he had difficulty initiating push off and reported machine was broken. Adjustments were made to weight however challenge continued. No official reps performed on the leg press. Patient will benefit from skilled physical therapy improve patient's ambulation capacity, mobility, strength, and independence with IADLs/ADLS.         PT Short Term Goals - 03/29/21 0855       PT SHORT TERM GOAL #1   Title Pt will be independent with HEP to improve strength, balance, endurance to improve community ADL's.    Baseline 8/2: Initiate next session 9/13: intermittent compliance 10/4: intermittent compliance 11/29: HEP compliance, new HEP given    Time 4    Period Weeks    Status Partially Met    Target Date 04/26/21               PT Long Term Goals - 05/10/21 1320       PT LONG TERM GOAL #1   Title Pt will increase FOTO to target to demonstrate improved ability to complete functional mobility and ADL's.     Baseline 8/2: 48/64 9/13: 57% 10/4:  57% 11/29: 51% 1/10: 51%    Time 8    Period Weeks    Status Partially Met    Target Date 05/24/21      PT LONG TERM GOAL #2   Title Pt will improve 5xSTS time to < 10 sec to demonstrate clinically significant improvement in BLE strength.    Baseline 8/2: 13.8 sec 9/13: 12.41 seconds 10/4: 12.3 second arms crossed 11/29: 9.7 seconds without UE  support    Time 8    Period Weeks    Status Achieved      PT LONG TERM GOAL #3   Title Pt will improve gait speed to at least 1.0 m/s to display evidenced norms to safely complete community wlaking tasks with decreased risk of falls.    Baseline 8/2: 0.678 m/s 9/13: 0.7 m/s 10/4: 0.87 m/s  11/29: 0.94 m/s with 8/10 knee pain 1/10: 0.75 m/s    Time 8    Period Weeks    Status Partially Met    Target Date 05/24/21      PT LONG TERM GOAL #4   Title Pt will improve 6MWT to at least 1000' to demo ability to complete community distance walking tasks.    Baseline 8/2: Deferred due to BP readings. 8/4: 64 ft 9/13: 120 ft in 1 min 20 seconds 10/4: 200 ft with 2 min 58 seconds 11/29: 265 ft in 3 minutes and 3 seconds with 10/10 pain 1/10: deferred due to pain    Time 8    Period Weeks    Status Partially Met    Target Date 05/24/21      PT LONG TERM GOAL #5   Title Patient will increase Berg Balance score by > 6 points (42/56 ) to demonstrate decreased fall risk during functional activities.    Baseline 10/4: 36/56 11/29: 44/56    Time 8    Period Weeks    Status Achieved      PT LONG TERM GOAL #6   Title Patient will increase Berg Balance score by > 6 points (50/56 ) to demonstrate decreased fall risk during functional activities.    Baseline 11/29: 44/56 1/10: 43/56    Time 8    Period Weeks    Status On-going    Target Date 05/24/21      PT LONG TERM GOAL #7   Title Patient will report less than 7/10 pain in L knee with ambulation improving quality of life and reduced pain mobility.    Baseline 11/29;  10/10 pain with prolonged ambulation 1/10: 10/10 with ambulation    Time 8    Period Weeks    Status On-going    Target Date 05/24/21                   Plan - 05/12/21 1253     Clinical Impression Statement POC was continued with LE strengthening and endurance. Rest breaks taken due to pt fatigue and significant pain increases in L knee. Vitals stable at beginning of session and after NuStep. He remained motivated through most of session until attempting to finish session on leg press - he had difficulty initiating push off and reported machine was broken. Adjustments were made to weight however challenge continued. No official reps performed on the leg press. Patient will benefit from skilled physical therapy improve patient's ambulation capacity, mobility, strength, and independence with IADLs/ADLS.    Personal Factors and Comorbidities Time since onset of injury/illness/exacerbation;Age;Comorbidity 3+;Education;Past/Current Experience;Fitness    Comorbidities PMH includes: Paroxysmal A-fib, COPD, DM Type 2, CHF, OA of bilat knees, HTN, glaucoma of both eyes.    Examination-Activity Limitations Stairs;Squat;Stand;Locomotion Level    Examination-Participation Restrictions Community Activity    Stability/Clinical Decision Making Evolving/Moderate complexity    Rehab Potential Fair    PT Frequency 2x / week    PT Duration 8 weeks    PT Treatment/Interventions ADLs/Self Care Home Management;Cryotherapy;Gait training;Therapeutic exercise;Patient/family education;Manual techniques;Passive range of motion;Spinal  Manipulations;Dry needling;Aquatic Therapy;Electrical Stimulation;Stair training;Functional mobility training;Neuromuscular re-education;Therapeutic activities;Balance training;Energy conservation;Moist Heat    PT Next Visit Plan BP, strengthening    PT Home Exercise Plan Next session    Consulted and Agree with Plan of Care Patient             Patient will benefit from  skilled therapeutic intervention in order to improve the following deficits and impairments:  Abnormal gait, Improper body mechanics, Decreased mobility, Decreased activity tolerance, Decreased balance, Decreased endurance, Decreased range of motion, Decreased strength, Difficulty walking, Decreased safety awareness, Impaired vision/preception  Visit Diagnosis: Abnormality of gait and mobility  Muscle weakness (generalized)  Difficulty in walking, not elsewhere classified     Problem List Patient Active Problem List   Diagnosis Date Noted   Bilateral primary osteoarthritis of knee 06/11/2019   Cervical radicular pain 06/11/2019   Cervical facet joint syndrome 06/11/2019   Lumbar facet arthropathy 06/11/2019   Lumbar radiculopathy 06/11/2019   Chronic pain syndrome 06/11/2019   Lung nodule 02/09/2016   Exercise hypoxemia 12/28/2015   Chronic obstructive pulmonary disease ([place]) 03/16/2015   Nuclear sclerosis 03/16/2015   OSA (obstructive sleep apnea) 03/16/2015   Primary open angle glaucoma of both eyes, severe stage 03/16/2015   Type 2 diabetes mellitus without complication, without long-term current use of insulin ([place]) 03/16/2015   Arthralgia of left lower leg 10/29/2014   Osteoarthritis of left knee 09/10/2014   Type 2 diabetes mellitus with diabetic neuropathy ([place]) 08/04/2014   Cataract, nuclear sclerotic senile 04/01/2014   Combined forms of age-related cataract 04/01/2014   Keratitis sicca, bilateral ([place]) 04/01/2014   Primary open angle glaucoma 02/27/2014   Flat feet 01/29/2014   Foot pain, bilateral 01/29/2014   Tinea pedis of both feet 01/29/2014   Ptosis of both eyelids 11/25/2013   Type 2 diabetes mellitus without retinopathy ([place]) 11/25/2013   Onychomycosis 89/21/1941   Chronic systolic heart failure ([place]) 11/11/2013   Emphysema of lung ([place]) 11/11/2013   Essential hypertension 11/11/2013   Screen for colon cancer 11/05/2013   Cardiomyopathy ([place]) 74/11/1446    Systolic HF (heart failure) ([place]) 18/56/3149   Acute systolic CHF (congestive heart failure) ([place]) 09/16/2013   COPD (chronic obstructive pulmonary disease) ([place]) 09/16/2013   HTN (hypertension) 09/16/2013   Obesity 09/16/2013   PAF (paroxysmal atrial fibrillation) ([place]) 09/16/2013   Senile nuclear sclerosis 05/22/2013   Muscle weakness (generalized) 11/11/2009   Long term current use of anticoagulant therapy 06/17/2009   Pulmonary embolism ([place]) 06/17/2009   ED (erectile dysfunction) of organic origin 05/13/2003    Patrina Levering PT, DPT   [place] [facility] MAIN [facility] SERVICES [address] [place], [place], [address] Phone: [phone]   Fax:  [phone]  Name: Mario [name] MRN: [id number] Date of Birth: April 20, 1946

## 2021-05-17 ENCOUNTER — Ambulatory Visit: Payer: Medicare Other

## 2021-05-17 ENCOUNTER — Other Ambulatory Visit: Payer: Self-pay

## 2021-05-17 DIAGNOSIS — M6281 Muscle weakness (generalized): Secondary | ICD-10-CM

## 2021-05-17 DIAGNOSIS — R269 Unspecified abnormalities of gait and mobility: Secondary | ICD-10-CM

## 2021-05-17 DIAGNOSIS — R262 Difficulty in walking, not elsewhere classified: Secondary | ICD-10-CM

## 2021-05-17 NOTE — Therapy (Signed)
[place] MAIN [facility] SERVICES [address] [place], [place], [address] Phone: [phone]   Fax:  [phone]  Physical Therapy Treatment  Patient Details  Name: Mario [name] MRN: [id number] Date of Birth: [birth year] Referring Provider (PT): [name], Victoriano Lain, NP   Encounter Date: 05/17/2021   PT End of Session - 05/17/21 0757     Visit Number 32    Number of Visits 39    Date for PT Re-Evaluation 05/24/21    Authorization Type 2/10 PN 05/10/21    PT Start Time 0800    PT Stop Time 0844    PT Time Calculation (min) 44 min    Equipment Utilized During Treatment Gait belt;Oxygen    Activity Tolerance Patient tolerated treatment well   Elevated BP readings            Past Medical History:  Diagnosis Date   Arthritis    hands, knees   Atrial fibrillation (HCC)    CHF (congestive heart failure) (HCC)    COPD (chronic obstructive pulmonary disease) (HCC)    Diabetes mellitus without complication (HCC)    Diabetic neuropathy (HCC)    Heart murmur    in past, mild 1969   Hypertension    resolved after weight loss and more activity   Neuromuscular disorder ([place])    neuropathy   Neuropathy    Pulmonary embolism ([place])    approx 2014   Shingles '99   Sleep apnea    CPAP   Wears dentures    full upper and lower    Past Surgical History:  Procedure Laterality Date   CATARACT EXTRACTION W/PHACO Left 05/12/2019   Procedure: CATARACT EXTRACTION PHACO AND INTRAOCULAR LENS PLACEMENT ([place]) LEFTDIABETIC ISTENT INJ 2.43  00:28.6;  Surgeon: Eulogio Bear, MD;  Location: [facility];  Service: Ophthalmology;  Laterality: Left;  Diabetic - oral meds sleep apnea   CATARACT EXTRACTION W/PHACO Right 06/02/2019   Procedure: CATARACT EXTRACTION PHACO AND INTRAOCULAR LENS PLACEMENT ([facility]) ISTENT INJ  RIGHT DIABETIC 1.31  00:25.4;  Surgeon: Eulogio Bear, MD;  Location: [facility];  Service: Ophthalmology;  Laterality:  Right;   COLONOSCOPY  08/30/2015   FINGER SURGERY Right    3'rd finger, right hand   KNEE SURGERY Right    screw in knee   WRIST SURGERY Left    metal removed    There were no vitals filed for this visit.   Subjective Assessment - 05/17/21 0806     Subjective Patient is wearing his old brace today on his left knee. Has a respiratory appointment later today. No falls since last session.    Pertinent History Pt is a 76 y.o. male referred to OPPT for LE weakness, bilat join OA, and diabetic peripheral neuropathy. PMH includes: Paroxysmal A-fib, COPD, DM Type 2, CHF, OA of bilat knees, HTN, glaucoma of both eyes. Pt reports losing weight 265 lbs to 210 lbs and this is when he noticed he started having generalized weakness and knee pain. Has received steroid injections which temporarily helped (~1 year ago). Pt having difficulty in bowling for fun, walking to complete community tasks and ADL's (requires electric buggy for shopping). Reports walking ~ 12 meters. Pt does report NO falls besides slipping on lane with bowling. Has had near falls but has used walls to catch himself. Pt reports being on 2 L/min for COPD, denies any balance impairments. Pt's goals for PT are to return to bowling, and complete community walking  tasks without having to take frequent seated rest breaks.    Limitations Lifting;Standing;Walking;House hold activities    How long can you sit comfortably? unlimited    How long can you stand comfortably? 3 minutes    How long can you walk comfortably? length of hallway in gym (10-12 m)    Patient Stated Goals Return to community ambulation.    Currently in Pain? Yes    Pain Score 4     Pain Location Knee    Pain Orientation Left    Pain Descriptors / Indicators Aching    Pain Type Chronic pain                   Treatment: BP at start of session 125/81   TherEx Nustep Lvl 5, seat #10 RMP> 70 for cardiovascular and musculoskeletal challenge 4 minutes   BTB  abduction 15x ; hold 3 seconds at full abduction  BTB alternating IR/ER lateral step out 10x each LE  Matrix cable hamstring curls #12.5 15x each LE; 2 sets 3 second scapular squeeze each rep  Matrix row with bilateral handles #27.5 15x; 2 sets   Precor leg press bilateral LE"s #130 requires min A for placement of feet, cues for keeping knees from hyperextension 15x ; second and third set #145 15x; stabilization band to L knee for pain reduction ;    Precor leg press single LE #70 10x each LE ; stabilization to lateral aspect of L knee for pain reduction ; x2 sets     vitals monitored throughout PT session, SP02 dropped to 82% after leg press requiring seated rest break to return to therapeutic range.   Pt educated throughout session about proper posture and technique with exercises. Improved exercise technique, movement at target joints, use of target muscles after min to mod verbal, visual, tactile cues.   Patient tolerates progressive strengthening training. Requests he continues to be progressed despite previous attempts resulting in patient declining activities. Patient's  vitals monitored throughout PT session, SP02 dropped to 82% after leg press requiring seated rest break to return to therapeutic range. Patient will benefit from skilled physical therapy improve patient's ambulation capacity, mobility, strength, and independence with IADLs/ADLS.                  PT Education - 05/17/21 0757     Education Details exercise technique, body mechanics    Person(s) Educated Patient    Methods Explanation;Demonstration;Tactile cues;Verbal cues    Comprehension Verbalized understanding;Returned demonstration;Verbal cues required;Tactile cues required              PT Short Term Goals - 03/29/21 0855       PT SHORT TERM GOAL #1   Title Pt will be independent with HEP to improve strength, balance, endurance to improve community ADL's.    Baseline 8/2: Initiate next  session 9/13: intermittent compliance 10/4: intermittent compliance 11/29: HEP compliance, new HEP given    Time 4    Period Weeks    Status Partially Met    Target Date 04/26/21               PT Long Term Goals - 05/10/21 1320       PT LONG TERM GOAL #1   Title Pt will increase FOTO to target to demonstrate improved ability to complete functional mobility and ADL's.    Baseline 8/2: 48/64 9/13: 57% 10/4:  57% 11/29: 51% 1/10: 51%    Time 8  Period Weeks    Status Partially Met    Target Date 05/24/21      PT LONG TERM GOAL #2   Title Pt will improve 5xSTS time to < 10 sec to demonstrate clinically significant improvement in BLE strength.    Baseline 8/2: 13.8 sec 9/13: 12.41 seconds 10/4: 12.3 second arms crossed 11/29: 9.7 seconds without UE support    Time 8    Period Weeks    Status Achieved      PT LONG TERM GOAL #3   Title Pt will improve gait speed to at least 1.0 m/s to display evidenced norms to safely complete community wlaking tasks with decreased risk of falls.    Baseline 8/2: 0.678 m/s 9/13: 0.7 m/s 10/4: 0.87 m/s  11/29: 0.94 m/s with 8/10 knee pain 1/10: 0.75 m/s    Time 8    Period Weeks    Status Partially Met    Target Date 05/24/21      PT LONG TERM GOAL #4   Title Pt will improve 6MWT to at least 1000' to demo ability to complete community distance walking tasks.    Baseline 8/2: Deferred due to BP readings. 8/4: 64 ft 9/13: 120 ft in 1 min 20 seconds 10/4: 200 ft with 2 min 58 seconds 11/29: 265 ft in 3 minutes and 3 seconds with 10/10 pain 1/10: deferred due to pain    Time 8    Period Weeks    Status Partially Met    Target Date 05/24/21      PT LONG TERM GOAL #5   Title Patient will increase Berg Balance score by > 6 points (42/56 ) to demonstrate decreased fall risk during functional activities.    Baseline 10/4: 36/56 11/29: 44/56    Time 8    Period Weeks    Status Achieved      PT LONG TERM GOAL #6   Title Patient will increase  Berg Balance score by > 6 points (50/56 ) to demonstrate decreased fall risk during functional activities.    Baseline 11/29: 44/56 1/10: 43/56    Time 8    Period Weeks    Status On-going    Target Date 05/24/21      PT LONG TERM GOAL #7   Title Patient will report less than 7/10 pain in L knee with ambulation improving quality of life and reduced pain mobility.    Baseline 11/29; 10/10 pain with prolonged ambulation 1/10: 10/10 with ambulation    Time 8    Period Weeks    Status On-going    Target Date 05/24/21                   Plan - 05/17/21 1610     Clinical Impression Statement Patient tolerates progressive strengthening training. Requests he continues to be progressed despite previous attempts resulting in patient declining activities. Patient's ? vitals monitored throughout PT session, SP02 dropped to 82% after leg press requiring seated rest break to return to therapeutic range. Patient will benefit from skilled physical therapy improve patient's ambulation capacity, mobility, strength, and independence with IADLs/ADLS    Personal Factors and Comorbidities Time since onset of injury/illness/exacerbation;Age;Comorbidity 3+;Education;Past/Current Experience;Fitness    Comorbidities PMH includes: Paroxysmal A-fib, COPD, DM Type 2, CHF, OA of bilat knees, HTN, glaucoma of both eyes.    Examination-Activity Limitations Stairs;Squat;Stand;Locomotion Level    Examination-Participation Restrictions Community Activity    Stability/Clinical Decision Making Evolving/Moderate complexity    Rehab Potential  Fair    PT Frequency 2x / week    PT Duration 8 weeks    PT Treatment/Interventions ADLs/Self Care Home Management;Cryotherapy;Gait training;Therapeutic exercise;Patient/family education;Manual techniques;Passive range of motion;Spinal Manipulations;Dry needling;Aquatic Therapy;Electrical Stimulation;Stair training;Functional mobility training;Neuromuscular  re-education;Therapeutic activities;Balance training;Energy conservation;Moist Heat    PT Next Visit Plan BP, strengthening    PT Home Exercise Plan Next session    Consulted and Agree with Plan of Care Patient             Patient will benefit from skilled therapeutic intervention in order to improve the following deficits and impairments:  Abnormal gait, Improper body mechanics, Decreased mobility, Decreased activity tolerance, Decreased balance, Decreased endurance, Decreased range of motion, Decreased strength, Difficulty walking, Decreased safety awareness, Impaired vision/preception  Visit Diagnosis: Abnormality of gait and mobility  Muscle weakness (generalized)  Difficulty in walking, not elsewhere classified     Problem List Patient Active Problem List   Diagnosis Date Noted   Bilateral primary osteoarthritis of knee 06/11/2019   Cervical radicular pain 06/11/2019   Cervical facet joint syndrome 06/11/2019   Lumbar facet arthropathy 06/11/2019   Lumbar radiculopathy 06/11/2019   Chronic pain syndrome 06/11/2019   Lung nodule 02/09/2016   Exercise hypoxemia 12/28/2015   Chronic obstructive pulmonary disease ([place]) 03/16/2015   Nuclear sclerosis 03/16/2015   OSA (obstructive sleep apnea) 03/16/2015   Primary open angle glaucoma of both eyes, severe stage 03/16/2015   Type 2 diabetes mellitus without complication, without long-term current use of insulin ([place]) 03/16/2015   Arthralgia of left lower leg 10/29/2014   Osteoarthritis of left knee 09/10/2014   Type 2 diabetes mellitus with diabetic neuropathy ([place]) 08/04/2014   Cataract, nuclear sclerotic senile 04/01/2014   Combined forms of age-related cataract 04/01/2014   Keratitis sicca, bilateral ([place]) 04/01/2014   Primary open angle glaucoma 02/27/2014   Flat feet 01/29/2014   Foot pain, bilateral 01/29/2014   Tinea pedis of both feet 01/29/2014   Ptosis of both eyelids 11/25/2013   Type 2 diabetes mellitus  without retinopathy ([place]) 11/25/2013   Onychomycosis 73/42/8768   Chronic systolic heart failure ([place]) 11/11/2013   Emphysema of lung ([place]) 11/11/2013   Essential hypertension 11/11/2013   Screen for colon cancer 11/05/2013   Cardiomyopathy ([place]) 11/57/2620   Systolic HF (heart failure) ([place]) 35/59/7416   Acute systolic CHF (congestive heart failure) ([place]) 09/16/2013   COPD (chronic obstructive pulmonary disease) ([place]) 09/16/2013   HTN (hypertension) 09/16/2013   Obesity 09/16/2013   PAF (paroxysmal atrial fibrillation) ([place]) 09/16/2013   Senile nuclear sclerosis 05/22/2013   Muscle weakness (generalized) 11/11/2009   Long term current use of anticoagulant therapy 06/17/2009   Pulmonary embolism ([place]) 06/17/2009   ED (erectile dysfunction) of organic origin 05/13/2003    Janna Arch, PT, DPT  05/17/2021, 8:44 AM  [place] MAIN [facility] SERVICES [address] [place], [place], [address] Phone: [phone]   Fax:  [phone]  Name: Mario [name] MRN: [id number] Date of Birth: 15-Jun-1945

## 2021-05-19 ENCOUNTER — Other Ambulatory Visit: Payer: Self-pay

## 2021-05-19 ENCOUNTER — Ambulatory Visit: Payer: Medicare Other

## 2021-05-19 DIAGNOSIS — R269 Unspecified abnormalities of gait and mobility: Secondary | ICD-10-CM | POA: Diagnosis not present

## 2021-05-19 DIAGNOSIS — M6281 Muscle weakness (generalized): Secondary | ICD-10-CM

## 2021-05-19 DIAGNOSIS — R262 Difficulty in walking, not elsewhere classified: Secondary | ICD-10-CM

## 2021-05-19 NOTE — Therapy (Signed)
[place] MAIN [facility] SERVICES [address] [place], [place], [address] Phone: [phone]   Fax:  [phone]  Physical Therapy Treatment  Patient Details  Name: Mario [name] MRN: [id number] Date of Birth: [birth year] Referring Provider (PT): [name], Victoriano Lain, NP   Encounter Date: 05/19/2021   PT End of Session - 05/19/21 0806     Visit Number 33    Number of Visits 39    Date for PT Re-Evaluation 05/24/21    Authorization Type 3/10 PN 05/10/21    PT Start Time 0801    PT Stop Time 7106    PT Time Calculation (min) 43 min    Equipment Utilized During Treatment Gait belt;Oxygen    Activity Tolerance Patient tolerated treatment well   Elevated BP readings            Past Medical History:  Diagnosis Date   Arthritis    hands, knees   Atrial fibrillation (HCC)    CHF (congestive heart failure) (HCC)    COPD (chronic obstructive pulmonary disease) (HCC)    Diabetes mellitus without complication (HCC)    Diabetic neuropathy (HCC)    Heart murmur    in past, mild 1969   Hypertension    resolved after weight loss and more activity   Neuromuscular disorder ([place])    neuropathy   Neuropathy    Pulmonary embolism ([place])    approx 2014   Shingles '99   Sleep apnea    CPAP   Wears dentures    full upper and lower    Past Surgical History:  Procedure Laterality Date   CATARACT EXTRACTION W/PHACO Left 05/12/2019   Procedure: CATARACT EXTRACTION PHACO AND INTRAOCULAR LENS PLACEMENT ([place]) LEFTDIABETIC ISTENT INJ 2.43  00:28.6;  Surgeon: Eulogio Bear, MD;  Location: [place];  Service: Ophthalmology;  Laterality: Left;  Diabetic - oral meds sleep apnea   CATARACT EXTRACTION W/PHACO Right 06/02/2019   Procedure: CATARACT EXTRACTION PHACO AND INTRAOCULAR LENS PLACEMENT ([facility]) ISTENT INJ  RIGHT DIABETIC 1.31  00:25.4;  Surgeon: Eulogio Bear, MD;  Location: [place];  Service: Ophthalmology;  Laterality:  Right;   COLONOSCOPY  08/30/2015   FINGER SURGERY Right    3'rd finger, right hand   KNEE SURGERY Right    screw in knee   WRIST SURGERY Left    metal removed    There were no vitals filed for this visit.   Subjective Assessment - 05/19/21 0805     Subjective Patient had to reschedule his pulmonary appointment due to a conflict of times. Just took his medication prior to PT session.    Pertinent History Pt is a 76 y.o. male referred to OPPT for LE weakness, bilat join OA, and diabetic peripheral neuropathy. PMH includes: Paroxysmal A-fib, COPD, DM Type 2, CHF, OA of bilat knees, HTN, glaucoma of both eyes. Pt reports losing weight 265 lbs to 210 lbs and this is when he noticed he started having generalized weakness and knee pain. Has received steroid injections which temporarily helped (~1 year ago). Pt having difficulty in bowling for fun, walking to complete community tasks and ADL's (requires electric buggy for shopping). Reports walking ~ 12 meters. Pt does report NO falls besides slipping on lane with bowling. Has had near falls but has used walls to catch himself. Pt reports being on 2 L/min for COPD, denies any balance impairments. Pt's goals for PT are to return to bowling, and complete community walking tasks  without having to take frequent seated rest breaks.    Limitations Lifting;Standing;Walking;House hold activities    How long can you sit comfortably? unlimited    How long can you stand comfortably? 3 minutes    How long can you walk comfortably? length of hallway in gym (10-12 m)    Patient Stated Goals Return to community ambulation.    Currently in Pain? Yes    Pain Score 4     Pain Location Knee    Pain Orientation Left    Pain Descriptors / Indicators Aching    Pain Type Chronic pain                       Treatment: BP at start of session 130/69   TherEx Nustep Lvl 5, seat #10 RMP> 70 for cardiovascular and musculoskeletal challenge 67mnutes;  patient challenged with prolonged muscle recruitment fatiguing quickly       Precor leg press bilateral LE"s #130 requires min A for placement of feet, cues for keeping knees from hyperextension 15x ; second and third set #145 15x; stabilization band to L knee for pain reduction ;    Precor leg press single LE #70 10x each LE ; stabilization to lateral aspect of L knee for pain reduction ; x2 sets     Circuit 1:; repeat x2 trials   Ambulate 86 ft with close CGA and cues for sequencing and safety  Sit to stand 5x without UE support   Circuit 2: repeat x 2 trials Weave between 3 cones BTB marches seated 15x each LE  ; arms crossed upright posture    Circuit 3:  Walk with CGA and holding oxygen 150 ft; last 30 ft severe challenge Seated: adduction ball squeeze 15x 3 second holds   Pt educated throughout session about proper posture and technique with exercises. Improved exercise technique, movement at target joints, use of target muscles after min to mod verbal, visual, tactile cues.    vitals monitored throughout PT session, SP02 dropped to 88% requiring seated rest break to return to therapeutic range.     Patient wants to start standing for longer durations. Is having a hard time standing for prolonged periods. Patient educated that next week will be re-testing and that progress needs to be present in order to continue therapy. Prolonged ambulation is challenging for patient with frequent fatigue. Patient will benefit from skilled physical therapy improve patient's ambulation capacity, mobility, strength, and independence with IADLs/ADLS          PT Education - 05/19/21 0806     Education Details exercise technique, body mechanics    Methods Explanation;Demonstration;Tactile cues;Verbal cues    Comprehension Verbalized understanding;Returned demonstration;Verbal cues required;Tactile cues required              PT Short Term Goals - 03/29/21 0855       PT SHORT TERM  GOAL #1   Title Pt will be independent with HEP to improve strength, balance, endurance to improve community ADL's.    Baseline 8/2: Initiate next session 9/13: intermittent compliance 10/4: intermittent compliance 11/29: HEP compliance, new HEP given    Time 4    Period Weeks    Status Partially Met    Target Date 04/26/21               PT Long Term Goals - 05/10/21 1320       PT LONG TERM GOAL #1   Title Pt will increase FOTO  to target to demonstrate improved ability to complete functional mobility and ADL's.    Baseline 8/2: 48/64 9/13: 57% 10/4:  57% 11/29: 51% 1/10: 51%    Time 8    Period Weeks    Status Partially Met    Target Date 05/24/21      PT LONG TERM GOAL #2   Title Pt will improve 5xSTS time to < 10 sec to demonstrate clinically significant improvement in BLE strength.    Baseline 8/2: 13.8 sec 9/13: 12.41 seconds 10/4: 12.3 second arms crossed 11/29: 9.7 seconds without UE support    Time 8    Period Weeks    Status Achieved      PT LONG TERM GOAL #3   Title Pt will improve gait speed to at least 1.0 m/s to display evidenced norms to safely complete community wlaking tasks with decreased risk of falls.    Baseline 8/2: 0.678 m/s 9/13: 0.7 m/s 10/4: 0.87 m/s  11/29: 0.94 m/s with 8/10 knee pain 1/10: 0.75 m/s    Time 8    Period Weeks    Status Partially Met    Target Date 05/24/21      PT LONG TERM GOAL #4   Title Pt will improve 6MWT to at least 1000' to demo ability to complete community distance walking tasks.    Baseline 8/2: Deferred due to BP readings. 8/4: 64 ft 9/13: 120 ft in 1 min 20 seconds 10/4: 200 ft with 2 min 58 seconds 11/29: 265 ft in 3 minutes and 3 seconds with 10/10 pain 1/10: deferred due to pain    Time 8    Period Weeks    Status Partially Met    Target Date 05/24/21      PT LONG TERM GOAL #5   Title Patient will increase Berg Balance score by > 6 points (42/56 ) to demonstrate decreased fall risk during functional activities.     Baseline 10/4: 36/56 11/29: 44/56    Time 8    Period Weeks    Status Achieved      PT LONG TERM GOAL #6   Title Patient will increase Berg Balance score by > 6 points (50/56 ) to demonstrate decreased fall risk during functional activities.    Baseline 11/29: 44/56 1/10: 43/56    Time 8    Period Weeks    Status On-going    Target Date 05/24/21      PT LONG TERM GOAL #7   Title Patient will report less than 7/10 pain in L knee with ambulation improving quality of life and reduced pain mobility.    Baseline 11/29; 10/10 pain with prolonged ambulation 1/10: 10/10 with ambulation    Time 8    Period Weeks    Status On-going    Target Date 05/24/21                   Plan - 05/19/21 0825     Clinical Impression Statement Patient wants to start standing for longer durations. Is having a hard time standing for prolonged periods. Patient educated that next week will be re-testing and that progress needs to be present in order to continue therapy. Prolonged ambulation is challenging for patient with frequent fatigue. Patient will benefit from skilled physical therapy improve patient's ambulation capacity, mobility, strength, and independence with IADLs/ADLS    Personal Factors and Comorbidities Time since onset of injury/illness/exacerbation;Age;Comorbidity 3+;Education;Past/Current Experience;Fitness    Comorbidities PMH includes: Paroxysmal A-fib, COPD, DM  Type 2, CHF, OA of bilat knees, HTN, glaucoma of both eyes.    Examination-Activity Limitations Stairs;Squat;Stand;Locomotion Level    Examination-Participation Restrictions Community Activity    Stability/Clinical Decision Making Evolving/Moderate complexity    Rehab Potential Fair    PT Frequency 2x / week    PT Duration 8 weeks    PT Treatment/Interventions ADLs/Self Care Home Management;Cryotherapy;Gait training;Therapeutic exercise;Patient/family education;Manual techniques;Passive range of motion;Spinal  Manipulations;Dry needling;Aquatic Therapy;Electrical Stimulation;Stair training;Functional mobility training;Neuromuscular re-education;Therapeutic activities;Balance training;Energy conservation;Moist Heat    PT Next Visit Plan BP, strengthening    PT Home Exercise Plan Next session    Consulted and Agree with Plan of Care Patient             Patient will benefit from skilled therapeutic intervention in order to improve the following deficits and impairments:  Abnormal gait, Improper body mechanics, Decreased mobility, Decreased activity tolerance, Decreased balance, Decreased endurance, Decreased range of motion, Decreased strength, Difficulty walking, Decreased safety awareness, Impaired vision/preception  Visit Diagnosis: Abnormality of gait and mobility  Muscle weakness (generalized)  Difficulty in walking, not elsewhere classified     Problem List Patient Active Problem List   Diagnosis Date Noted   Bilateral primary osteoarthritis of knee 06/11/2019   Cervical radicular pain 06/11/2019   Cervical facet joint syndrome 06/11/2019   Lumbar facet arthropathy 06/11/2019   Lumbar radiculopathy 06/11/2019   Chronic pain syndrome 06/11/2019   Lung nodule 02/09/2016   Exercise hypoxemia 12/28/2015   Chronic obstructive pulmonary disease ([place]) 03/16/2015   Nuclear sclerosis 03/16/2015   OSA (obstructive sleep apnea) 03/16/2015   Primary open angle glaucoma of both eyes, severe stage 03/16/2015   Type 2 diabetes mellitus without complication, without long-term current use of insulin ([place]) 03/16/2015   Arthralgia of left lower leg 10/29/2014   Osteoarthritis of left knee 09/10/2014   Type 2 diabetes mellitus with diabetic neuropathy ([place]) 08/04/2014   Cataract, nuclear sclerotic senile 04/01/2014   Combined forms of age-related cataract 04/01/2014   Keratitis sicca, 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/01/2014   Primary open angle glaucoma 02/27/2014   Flat feet 01/29/2014   Foot pain,  bilateral 01/29/2014   Tinea pedis of both feet 01/29/2014   Ptosis of both eyelids 11/25/2013   Type 2 diabetes mellitus without retinopathy ([place]) 11/25/2013   Onychomycosis 38/25/0539   Chronic systolic heart failure ([place]) 11/11/2013   Emphysema of lung ([place]) 11/11/2013   Essential hypertension 11/11/2013   Screen for colon cancer 11/05/2013   Cardiomyopathy ([place]) 76/73/4193   Systolic HF (heart failure) ([place]) 79/06/4095   Acute systolic CHF (congestive heart failure) ([place]) 09/16/2013   COPD (chronic obstructive pulmonary disease) ([place]) 09/16/2013   HTN (hypertension) 09/16/2013   Obesity 09/16/2013   PAF (paroxysmal atrial fibrillation) ([place]) 09/16/2013   Senile nuclear sclerosis 05/22/2013   Muscle weakness (generalized) 11/11/2009   Long term current use of anticoagulant therapy 06/17/2009   Pulmonary embolism ([place]) 06/17/2009   ED (erectile dysfunction) of organic origin 05/13/2003   Janna Arch, PT, DPT  05/19/2021, 8:44 AM  [place] MAIN [facility] SERVICES [address] [place], [place], [address] Phone: [phone]   Fax:  [phone]  Name: Mario [name] MRN: [id number] Date of Birth: [birth year]

## 2021-05-24 ENCOUNTER — Ambulatory Visit: Payer: Medicare Other

## 2021-05-24 ENCOUNTER — Other Ambulatory Visit: Payer: Self-pay

## 2021-05-24 DIAGNOSIS — R269 Unspecified abnormalities of gait and mobility: Secondary | ICD-10-CM | POA: Diagnosis not present

## 2021-05-24 DIAGNOSIS — M6281 Muscle weakness (generalized): Secondary | ICD-10-CM

## 2021-05-24 DIAGNOSIS — R262 Difficulty in walking, not elsewhere classified: Secondary | ICD-10-CM

## 2021-05-24 NOTE — Therapy (Signed)
[place] MAIN [facility] SERVICES [address] [place], [place], [address] Phone: [phone]   Fax:  [phone]  Physical Therapy Treatment/RECERT  Patient Details  Name: Mario [name] MRN: [id number] Date of Birth: 04-14-1946 Referring Provider (PT): [name], Victoriano Lain, NP   Encounter Date: 05/24/2021   PT End of Session - 05/24/21 0806     Visit Number 34    Number of Visits 50    Date for PT Re-Evaluation 07/19/21    Authorization Type 4/10 PN 05/10/21    PT Start Time 0758    PT Stop Time 0841    PT Time Calculation (min) 43 min    Equipment Utilized During Treatment Gait belt;Oxygen    Activity Tolerance Patient tolerated treatment well   Elevated BP readings            Past Medical History:  Diagnosis Date   Arthritis    hands, knees   Atrial fibrillation (HCC)    CHF (congestive heart failure) (HCC)    COPD (chronic obstructive pulmonary disease) (HCC)    Diabetes mellitus without complication (HCC)    Diabetic neuropathy (HCC)    Heart murmur    in past, mild 1969   Hypertension    resolved after weight loss and more activity   Neuromuscular disorder ([place])    neuropathy   Neuropathy    Pulmonary embolism ([employer])    approx 2014   Shingles '99   Sleep apnea    CPAP   Wears dentures    full upper and lower    Past Surgical History:  Procedure Laterality Date   CATARACT EXTRACTION W/PHACO Left 05/12/2019   Procedure: CATARACT EXTRACTION PHACO AND INTRAOCULAR LENS PLACEMENT ([place]) LEFTDIABETIC ISTENT INJ 2.43  00:28.6;  Surgeon: Eulogio Bear, MD;  Location: [place];  Service: Ophthalmology;  Laterality: Left;  Diabetic - oral meds sleep apnea   CATARACT EXTRACTION W/PHACO Right 06/02/2019   Procedure: CATARACT EXTRACTION PHACO AND INTRAOCULAR LENS PLACEMENT ([facility]) ISTENT INJ  RIGHT DIABETIC 1.31  00:25.4;  Surgeon: Eulogio Bear, MD;  Location: [place];  Service: Ophthalmology;   Laterality: Right;   COLONOSCOPY  08/30/2015   FINGER SURGERY Right    3'rd finger, right hand   KNEE SURGERY Right    screw in knee   WRIST SURGERY Left    metal removed    There were no vitals filed for this visit.   Subjective Assessment - 05/24/21 0801     Subjective Patient reports he thinks physical therapy has been helping. 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    Pertinent History Pt is a 76 y.o. male referred to OPPT for LE weakness, bilat join OA, and diabetic peripheral neuropathy. PMH includes: Paroxysmal A-fib, COPD, DM Type 2, CHF, OA of bilat knees, HTN, glaucoma of both eyes. Pt reports losing weight 265 lbs to 210 lbs and this is when he noticed he started having generalized weakness and knee pain. Has received steroid injections which temporarily helped (~1 year ago). Pt having difficulty in bowling for fun, walking to complete community tasks and ADL's (requires electric buggy for shopping). Reports walking ~ 12 meters. Pt does report NO falls besides slipping on lane with bowling. Has had near falls but has used walls to catch himself. Pt reports being on 2 L/min for COPD, denies any balance impairments. Pt's goals for PT are to return to bowling, and complete community walking tasks without having  to take frequent seated rest breaks.    Limitations Lifting;Standing;Walking;House hold activities    How long can you sit comfortably? unlimited    How long can you stand comfortably? 3 minutes    How long can you walk comfortably? length of hallway in gym (10-12 m)    Patient Stated Goals Return to community ambulation.    Currently in Pain? Yes    Pain Score 4     Pain Location Leg    Pain Orientation Left;Right    Pain Descriptors / Indicators Aching    Pain Type Chronic pain    Pain Onset More than a month ago    Pain Frequency Intermittent                [facility] PT Assessment - 05/24/21 0001       Standardized Balance Assessment    Standardized Balance Assessment Berg Balance Test      Berg Balance Test   Sit to Stand Able to stand without using hands and stabilize independently    Standing Unsupported Able to stand safely 2 minutes    Sitting with Back Unsupported but Feet Supported on Floor or Stool Able to sit safely and securely 2 minutes    Stand to Sit Sits safely with minimal use of hands    Transfers Able to transfer safely, minor use of hands    Standing Unsupported with Eyes Closed Able to stand 10 seconds safely    Standing Unsupported with Feet Together Able to place feet together independently and stand for 1 minute with supervision    From Standing, Reach Forward with Outstretched Arm Can reach forward >12 cm safely (5")    From Standing Position, Pick up Object from Floor Able to pick up shoe safely and easily    From Standing Position, Turn to Look Behind Over each Shoulder Looks behind one side only/other side shows less weight shift    Turn 360 Degrees Able to turn 360 degrees safely but slowly    Standing Unsupported, Alternately Place Feet on Step/Stool Able to stand independently and complete 8 steps >20 seconds    Standing Unsupported, One Foot in Front Able to take small step independently and hold 30 seconds    Standing on One Leg Able to lift leg independently and hold equal to or more than 3 seconds    Total Score 46               BP at start of session: 138/84   Goals:  HEP: compliance  FOTO: 60.8%  5xSTS : 9.7 seconds : MET  10 MWT: 9 seconds: =1.1 m/s  6 MWT: 400 ft -terminated due to SP02 drop at 4 minutes   BERG: 46/56  Pain in knee: 7-8/10   Vitals monitored due to oxygen saturation dropping   New goal: Carry items up stairs with BORG less than 4/10.   Treat: BTB hamstring curl 20x each LE March with RTB across toes with dorsiflexion 15x each LE  Pt educated throughout session about proper posture and technique with exercises. Improved exercise technique, movement  at target joints, use of target muscles after min to mod verbal, visual, tactile cues.   Patient is progressing towards functional goals, meeting 10 MWT and 5x STS. His balance is progressing but will continue to be an area of focus as well as ambulation duration. New goal addressing stairs and fatigue will be added to POC. Patient is progressing with functional strength and stability indicating  carryover from previous sessions. Patient was able to walk in grocery store with cart on Tuesday. Patient will benefit from skilled physical therapy improve patient's ambulation capacity, mobility, strength, and independence with IADLs/ADLS         PT Education - 05/24/21 0751     Education Details goals, POC    Person(s) Educated Patient    Methods Explanation;Demonstration;Tactile cues;Verbal cues    Comprehension Verbalized understanding;Returned demonstration;Verbal cues required;Tactile cues required              PT Short Term Goals - 05/24/21 0809       PT SHORT TERM GOAL #1   Title Pt will be independent with HEP to improve strength, balance, endurance to improve community ADL's.    Baseline 8/2: Initiate next session 9/13: intermittent compliance 10/4: intermittent compliance 11/29: HEP compliance, new HEP given 1/24: HEP compliance    Time 4    Period Weeks    Status Achieved    Target Date 04/26/21               PT Long Term Goals - 05/24/21 0810       PT LONG TERM GOAL #1   Title Pt will increase FOTO to target to demonstrate improved ability to complete functional mobility and ADL's.    Baseline 8/2: 48/64 9/13: 57% 10/4:  57% 11/29: 51% 1/10: 51% 1/24: 60.8%    Time 8    Period Weeks    Status Partially Met    Target Date 07/19/21      PT LONG TERM GOAL #2   Title Pt will improve 5xSTS time to < 10 sec to demonstrate clinically significant improvement in BLE strength.    Baseline 8/2: 13.8 sec 9/13: 12.41 seconds 10/4: 12.3 second arms crossed 11/29: 9.7  seconds without UE support 1/24: 9.7 seconds    Time 8    Period Weeks    Status Achieved      PT LONG TERM GOAL #3   Title Pt will improve gait speed to at least 1.0 m/s to display evidenced norms to safely complete community wlaking tasks with decreased risk of falls.    Baseline 8/2: 0.678 m/s 9/13: 0.7 m/s 10/4: 0.87 m/s  11/29: 0.94 m/s with 8/10 knee pain 1/10: 0.75 m/s 1/24: 1.1 m/s    Time 8    Period Weeks    Status Achieved    Target Date 05/24/21      PT LONG TERM GOAL #4   Title Pt will improve 6MWT to at least 1000' to demo ability to complete community distance walking tasks.    Baseline 8/2: Deferred due to BP readings. 8/4: 64 ft 9/13: 120 ft in 1 min 20 seconds 10/4: 200 ft with 2 min 58 seconds 11/29: 265 ft in 3 minutes and 3 seconds with 10/10 pain 1/10: deferred due to pain 1/24: 400 ft    Time 8    Period Weeks    Status Partially Met    Target Date 07/19/21      PT LONG TERM GOAL #5   Title Patient will increase Berg Balance score by > 6 points (42/56 ) to demonstrate decreased fall risk during functional activities.    Baseline 10/4: 36/56 11/29: 44/56    Time 8    Period Weeks    Status Achieved      Additional Long Term Goals   Additional Long Term Goals Yes      PT LONG TERM GOAL #6  Title Patient will increase Berg Balance score by > 6 points (50/56 ) to demonstrate decreased fall risk during functional activities.    Baseline 11/29: 44/56 1/10: 43/56 1/24: 46/56    Time 8    Period Weeks    Status Partially Met    Target Date 07/19/21      PT LONG TERM GOAL #7   Title Patient will report less than 7/10 pain in L knee with ambulation improving quality of life and reduced pain mobility.    Baseline 11/29; 10/10 pain with prolonged ambulation 1/10: 10/10 with ambulation 1/24: 7-8/10    Time 8    Period Weeks    Status Partially Met    Target Date 07/19/21      PT LONG TERM GOAL #8   Title Patient will be able to carry an object up the stairs  with a BORG of less than 4/10 for improved capacity of mobility and quality of life.    Baseline 1/24: some days unable to carry items up stairs    Time 8    Period Weeks    Status New    Target Date 07/19/21                   Plan - 05/24/21 0840     Clinical Impression Statement Patient is progressing towards functional goals, meeting 10 MWT and 5x STS. His balance is progressing but will continue to be an area of focus as well as ambulation duration. New goal addressing stairs and fatigue will be added to POC. Patient is progressing with functional strength and stability indicating carryover from previous sessions. Patient was able to walk in grocery store with cart on Tuesday. Patient will benefit from skilled physical therapy improve patient's ambulation capacity, mobility, strength, and independence with IADLs/ADLS    Personal Factors and Comorbidities Time since onset of injury/illness/exacerbation;Age;Comorbidity 3+;Education;Past/Current Experience;Fitness    Comorbidities PMH includes: Paroxysmal A-fib, COPD, DM Type 2, CHF, OA of bilat knees, HTN, glaucoma of both eyes.    Examination-Activity Limitations Stairs;Squat;Stand;Locomotion Level    Examination-Participation Restrictions Community Activity    Stability/Clinical Decision Making Evolving/Moderate complexity    Rehab Potential Fair    PT Frequency 2x / week    PT Duration 8 weeks    PT Treatment/Interventions ADLs/Self Care Home Management;Cryotherapy;Gait training;Therapeutic exercise;Patient/family education;Manual techniques;Passive range of motion;Spinal Manipulations;Dry needling;Aquatic Therapy;Electrical Stimulation;Stair training;Functional mobility training;Neuromuscular re-education;Therapeutic activities;Balance training;Energy conservation;Moist Heat    PT Next Visit Plan BP, strengthening    PT Home Exercise Plan Next session    Consulted and Agree with Plan of Care Patient             Patient  will benefit from skilled therapeutic intervention in order to improve the following deficits and impairments:  Abnormal gait, Improper body mechanics, Decreased mobility, Decreased activity tolerance, Decreased balance, Decreased endurance, Decreased range of motion, Decreased strength, Difficulty walking, Decreased safety awareness, Impaired vision/preception  Visit Diagnosis: Abnormality of gait and mobility  Muscle weakness (generalized)  Difficulty in walking, not elsewhere classified     Problem List Patient Active Problem List   Diagnosis Date Noted   Bilateral primary osteoarthritis of knee 06/11/2019   Cervical radicular pain 06/11/2019   Cervical facet joint syndrome 06/11/2019   Lumbar facet arthropathy 06/11/2019   Lumbar radiculopathy 06/11/2019   Chronic pain syndrome 06/11/2019   Lung nodule 02/09/2016   Exercise hypoxemia 12/28/2015   Chronic obstructive pulmonary disease ([place]) 03/16/2015   Nuclear sclerosis 03/16/2015  OSA (obstructive sleep apnea) 03/16/2015   Primary open angle glaucoma of both eyes, severe stage 03/16/2015   Type 2 diabetes mellitus without complication, without long-term current use of insulin ([place]) 03/16/2015   Arthralgia of left lower leg 10/29/2014   Osteoarthritis of left knee 09/10/2014   Type 2 diabetes mellitus with diabetic neuropathy ([place]) 08/04/2014   Cataract, nuclear sclerotic senile 04/01/2014   Combined forms of age-related cataract 04/01/2014   Keratitis sicca, bilateral ([place]) 04/01/2014   Primary open angle glaucoma 02/27/2014   Flat feet 01/29/2014   Foot pain, bilateral 01/29/2014   Tinea pedis of both feet 01/29/2014   Ptosis of both eyelids 11/25/2013   Type 2 diabetes mellitus without retinopathy ([place]) 11/25/2013   Onychomycosis 38/68/5488   Chronic systolic heart failure ([place]) 11/11/2013   Emphysema of lung ([place]) 11/11/2013   Essential hypertension 11/11/2013   Screen for colon cancer 11/05/2013   Cardiomyopathy  ([place]) 30/14/1597   Systolic HF (heart failure) ([place]) 33/03/5086   Acute systolic CHF (congestive heart failure) ([place]) 09/16/2013   COPD (chronic obstructive pulmonary disease) ([place]) 09/16/2013   HTN (hypertension) 09/16/2013   Obesity 09/16/2013   PAF (paroxysmal atrial fibrillation) ([place]) 09/16/2013   Senile nuclear sclerosis 05/22/2013   Muscle weakness (generalized) 11/11/2009   Long term current use of anticoagulant therapy 06/17/2009   Pulmonary embolism ([place]) 06/17/2009   ED (erectile dysfunction) of organic origin 05/13/2003    Janna Arch, PT, DPT  05/24/2021, 8:41 AM  [place] MAIN [facility] SERVICES [address] [place], [place], [address] Phone: [phone]   Fax:  [phone]  Name: Mario [name] MRN: [id number] Date of Birth: May 08, 1945

## 2021-05-26 ENCOUNTER — Ambulatory Visit: Payer: Medicare Other

## 2021-05-31 ENCOUNTER — Ambulatory Visit: Payer: Medicare Other

## 2021-06-02 ENCOUNTER — Ambulatory Visit: Payer: Medicare Other

## 2021-06-07 ENCOUNTER — Other Ambulatory Visit: Payer: Self-pay

## 2021-06-07 ENCOUNTER — Emergency Department
Admission: EM | Admit: 2021-06-07 | Discharge: 2021-06-07 | Disposition: A | Discharge status: Home / Self Care | Payer: Medicare Other | Attending: Emergency Medicine | Admitting: Emergency Medicine

## 2021-06-07 ENCOUNTER — Emergency Department: Payer: Medicare Other

## 2021-06-07 ENCOUNTER — Encounter: Payer: Self-pay | Admitting: Emergency Medicine

## 2021-06-07 ENCOUNTER — Ambulatory Visit: Payer: Medicare Other | Attending: Nurse Practitioner

## 2021-06-07 DIAGNOSIS — I10 Essential (primary) hypertension: Secondary | ICD-10-CM

## 2021-06-07 DIAGNOSIS — Z760 Encounter for issue of repeat prescription: Secondary | ICD-10-CM | POA: Diagnosis not present

## 2021-06-07 DIAGNOSIS — E119 Type 2 diabetes mellitus without complications: Secondary | ICD-10-CM | POA: Insufficient documentation

## 2021-06-07 DIAGNOSIS — I110 Hypertensive heart disease with heart failure: Secondary | ICD-10-CM | POA: Insufficient documentation

## 2021-06-07 DIAGNOSIS — J9611 Chronic respiratory failure with hypoxia: Secondary | ICD-10-CM

## 2021-06-07 DIAGNOSIS — I509 Heart failure, unspecified: Secondary | ICD-10-CM | POA: Insufficient documentation

## 2021-06-07 DIAGNOSIS — R269 Unspecified abnormalities of gait and mobility: Secondary | ICD-10-CM

## 2021-06-07 DIAGNOSIS — J449 Chronic obstructive pulmonary disease, unspecified: Secondary | ICD-10-CM | POA: Diagnosis not present

## 2021-06-07 DIAGNOSIS — M6281 Muscle weakness (generalized): Secondary | ICD-10-CM

## 2021-06-07 DIAGNOSIS — R0789 Other chest pain: Secondary | ICD-10-CM | POA: Diagnosis present

## 2021-06-07 DIAGNOSIS — Z20822 Contact with and (suspected) exposure to covid-19: Secondary | ICD-10-CM | POA: Diagnosis not present

## 2021-06-07 DIAGNOSIS — R262 Difficulty in walking, not elsewhere classified: Secondary | ICD-10-CM

## 2021-06-07 DIAGNOSIS — R079 Chest pain, unspecified: Secondary | ICD-10-CM

## 2021-06-07 LAB — CBC
HCT: 35.5 % — ABNORMAL LOW (ref 39.0–52.0)
Hemoglobin: 10.9 g/dL — ABNORMAL LOW (ref 13.0–17.0)
MCH: 31.1 pg (ref 26.0–34.0)
MCHC: 30.7 g/dL (ref 30.0–36.0)
MCV: 101.1 fL — ABNORMAL HIGH (ref 80.0–100.0)
Platelets: 280 10*3/uL (ref 150–400)
RBC: 3.51 MIL/uL — ABNORMAL LOW (ref 4.22–5.81)
RDW: 13.6 % (ref 11.5–15.5)
WBC: 9.2 10*3/uL (ref 4.0–10.5)
nRBC: 0 % (ref 0.0–0.2)

## 2021-06-07 LAB — BASIC METABOLIC PANEL
Anion gap: 7 (ref 5–15)
BUN: 22 mg/dL (ref 8–23)
CO2: 28 mmol/L (ref 22–32)
Calcium: 8.6 mg/dL — ABNORMAL LOW (ref 8.9–10.3)
Chloride: 103 mmol/L (ref 98–111)
Creatinine, Ser: 1.29 mg/dL — ABNORMAL HIGH (ref 0.61–1.24)
GFR, Estimated: 58 mL/min — ABNORMAL LOW (ref >60)
Glucose, Bld: 151 mg/dL — ABNORMAL HIGH (ref 70–99)
Potassium: 4.8 mmol/L (ref 3.5–5.1)
Sodium: 138 mmol/L (ref 135–145)

## 2021-06-07 LAB — RESP PANEL BY RT-PCR (FLU A&B, COVID) ARPGX2
Influenza A by PCR: NEGATIVE
Influenza B by PCR: NEGATIVE
SARS Coronavirus 2 by RT PCR: NEGATIVE

## 2021-06-07 LAB — TROPONIN I (HIGH SENSITIVITY): Troponin I (High Sensitivity): 6 ng/L (ref <18)

## 2021-06-07 MED ORDER — HYDROCHLOROTHIAZIDE 12.5 MG PO TABS: 12.5000 mg | ORAL_TABLET | Freq: Every day | ORAL | 0 refills | Status: AC

## 2021-06-07 MED ORDER — HYDROCHLOROTHIAZIDE 12.5 MG PO TABS
12.5000 mg | ORAL_TABLET | Freq: Once | ORAL | Status: AC
Start: 2021-06-07 — End: 2021-06-07
  Administered 2021-06-07: 12.5 mg via ORAL
  Filled 2021-06-07: qty 1

## 2021-06-07 NOTE — ED Triage Notes (Signed)
Pt comes into the ED via POV c/o HTN and chest pain.  Pt brought over by PT this morning.  Pt's BP this morning was 177/104.  Pt also admits to left side chest pain last night.  H/o A-fib, but does not stay in it.  Pt denies any other known cardiac history.  Pt denies any current chest pain.  Pt has even and unlabored respirations.

## 2021-06-07 NOTE — Therapy (Signed)
[place] MAIN [facility] SERVICES [address] [place], [place], [address] Phone: [phone]   Fax:  [phone]  Physical Therapy Treatment  Patient Details  Name: Mario [name] MRN: [id number] Date of Birth: 27-Sep-1945 Referring Provider (PT): [name], Victoriano Lain, NP   Encounter Date: 06/07/2021   PT End of Session - 06/07/21 0756     Visit Number 35    Number of Visits 50    Date for PT Re-Evaluation 07/19/21    Authorization Type 5/10 PN 05/10/21    PT Start Time 0759    PT Stop Time 0831    PT Time Calculation (min) 32 min    Equipment Utilized During Treatment Gait belt;Oxygen    Activity Tolerance Patient tolerated treatment well   Elevated BP readings            Past Medical History:  Diagnosis Date   Arthritis    hands, knees   Atrial fibrillation (HCC)    CHF (congestive heart failure) (HCC)    COPD (chronic obstructive pulmonary disease) (HCC)    Diabetes mellitus without complication (HCC)    Diabetic neuropathy (HCC)    Heart murmur    in past, mild 1969   Hypertension    resolved after weight loss and more activity   Neuromuscular disorder ([place])    neuropathy   Neuropathy    Pulmonary embolism ([place])    approx 2014   Shingles '99   Sleep apnea    CPAP   Wears dentures    full upper and lower    Past Surgical History:  Procedure Laterality Date   CATARACT EXTRACTION W/PHACO Left 05/12/2019   Procedure: CATARACT EXTRACTION PHACO AND INTRAOCULAR LENS PLACEMENT ([place]) LEFTDIABETIC ISTENT INJ 2.43  00:28.6;  Surgeon: Eulogio Bear, MD;  Location: [facility];  Service: Ophthalmology;  Laterality: Left;  Diabetic - oral meds sleep apnea   CATARACT EXTRACTION W/PHACO Right 06/02/2019   Procedure: CATARACT EXTRACTION PHACO AND INTRAOCULAR LENS PLACEMENT ([facility]) ISTENT INJ  RIGHT DIABETIC 1.31  00:25.4;  Surgeon: Eulogio Bear, MD;  Location: [facility];  Service: Ophthalmology;  Laterality:  Right;   COLONOSCOPY  08/30/2015   FINGER SURGERY Right    3'rd finger, right hand   KNEE SURGERY Right    screw in knee   WRIST SURGERY Left    metal removed    There were no vitals filed for this visit.   Subjective Assessment - 06/07/21 0806     Subjective Patient missed last week due to being ill. Has been intermittent compliant with HEP. No falls or LOB since last session.    Pertinent History Pt is a 76 y.o. male referred to OPPT for LE weakness, bilat join OA, and diabetic peripheral neuropathy. PMH includes: Paroxysmal A-fib, COPD, DM Type 2, CHF, OA of bilat knees, HTN, glaucoma of both eyes. Pt reports losing weight 265 lbs to 210 lbs and this is when he noticed he started having generalized weakness and knee pain. Has received steroid injections which temporarily helped (~1 year ago). Pt having difficulty in bowling for fun, walking to complete community tasks and ADL's (requires electric buggy for shopping). Reports walking ~ 12 meters. Pt does report NO falls besides slipping on lane with bowling. Has had near falls but has used walls to catch himself. Pt reports being on 2 L/min for COPD, denies any balance impairments. Pt's goals for PT are to return to bowling, and complete community walking tasks  without having to take frequent seated rest breaks.    Limitations Lifting;Standing;Walking;House hold activities    How long can you sit comfortably? unlimited    How long can you stand comfortably? 3 minutes    How long can you walk comfortably? length of hallway in gym (10-12 m)    Patient Stated Goals Return to community ambulation.    Currently in Pain? Yes    Pain Score 2     Pain Location Knee    Pain Orientation Right;Left    Pain Descriptors / Indicators Aching    Pain Type Chronic pain    Pain Onset More than a month ago              Treatment: BP at start of session 187/90; no reports of pain  Bp after nustep 177/104   TherEx Nustep Lvl 5, seat #10 RMP>  70 for cardiovascular and musculoskeletal challenge 6 minutes; patient challenged with prolonged muscle recruitment fatiguing quickly    Stand pivot transfer x 2 with focus on stabilization and upright posture.       After blood pressure taken final time patient reports last night he had chest pains. Due to patient's symptoms and high blood pressure patient taken to emergency department/urgent care for triage.                        PT Education - 06/07/21 0756     Education Details exercise technique, body mechanics    Person(s) Educated Patient    Methods Explanation;Demonstration;Tactile cues;Verbal cues    Comprehension Verbalized understanding;Returned demonstration;Verbal cues required;Tactile cues required              PT Short Term Goals - 05/24/21 0809       PT SHORT TERM GOAL #1   Title Pt will be independent with HEP to improve strength, balance, endurance to improve community ADL's.    Baseline 8/2: Initiate next session 9/13: intermittent compliance 10/4: intermittent compliance 11/29: HEP compliance, new HEP given 1/24: HEP compliance    Time 4    Period Weeks    Status Achieved    Target Date 04/26/21               PT Long Term Goals - 05/24/21 0810       PT LONG TERM GOAL #1   Title Pt will increase FOTO to target to demonstrate improved ability to complete functional mobility and ADL's.    Baseline 8/2: 48/64 9/13: 57% 10/4:  57% 11/29: 51% 1/10: 51% 1/24: 60.8%    Time 8    Period Weeks    Status Partially Met    Target Date 07/19/21      PT LONG TERM GOAL #2   Title Pt will improve 5xSTS time to < 10 sec to demonstrate clinically significant improvement in BLE strength.    Baseline 8/2: 13.8 sec 9/13: 12.41 seconds 10/4: 12.3 second arms crossed 11/29: 9.7 seconds without UE support 1/24: 9.7 seconds    Time 8    Period Weeks    Status Achieved      PT LONG TERM GOAL #3   Title Pt will improve gait speed to at least 1.0  m/s to display evidenced norms to safely complete community wlaking tasks with decreased risk of falls.    Baseline 8/2: 0.678 m/s 9/13: 0.7 m/s 10/4: 0.87 m/s  11/29: 0.94 m/s with 8/10 knee pain 1/10: 0.75 m/s 1/24: 1.1 m/s  Time 8    Period Weeks    Status Achieved    Target Date 05/24/21      PT LONG TERM GOAL #4   Title Pt will improve 6MWT to at least 1000' to demo ability to complete community distance walking tasks.    Baseline 8/2: Deferred due to BP readings. 8/4: 64 ft 9/13: 120 ft in 1 min 20 seconds 10/4: 200 ft with 2 min 58 seconds 11/29: 265 ft in 3 minutes and 3 seconds with 10/10 pain 1/10: deferred due to pain 1/24: 400 ft    Time 8    Period Weeks    Status Partially Met    Target Date 07/19/21      PT LONG TERM GOAL #5   Title Patient will increase Berg Balance score by > 6 points (42/56 ) to demonstrate decreased fall risk during functional activities.    Baseline 10/4: 36/56 11/29: 44/56    Time 8    Period Weeks    Status Achieved      Additional Long Term Goals   Additional Long Term Goals Yes      PT LONG TERM GOAL #6   Title Patient will increase Berg Balance score by > 6 points (50/56 ) to demonstrate decreased fall risk during functional activities.    Baseline 11/29: 44/56 1/10: 43/56 1/24: 46/56    Time 8    Period Weeks    Status Partially Met    Target Date 07/19/21      PT LONG TERM GOAL #7   Title Patient will report less than 7/10 pain in L knee with ambulation improving quality of life and reduced pain mobility.    Baseline 11/29; 10/10 pain with prolonged ambulation 1/10: 10/10 with ambulation 1/24: 7-8/10    Time 8    Period Weeks    Status Partially Met    Target Date 07/19/21      PT LONG TERM GOAL #8   Title Patient will be able to carry an object up the stairs with a BORG of less than 4/10 for improved capacity of mobility and quality of life.    Baseline 1/24: some days unable to carry items up stairs    Time 8    Period Weeks     Status New    Target Date 07/19/21                   Plan - 06/07/21 4481     Clinical Impression Statement After blood pressure taken final time patient reports last night he had chest pains. Due to patient's symptoms and high blood pressure patient taken to emergency department/urgent care for triage.    Personal Factors and Comorbidities Time since onset of injury/illness/exacerbation;Age;Comorbidity 3+;Education;Past/Current Experience;Fitness    Comorbidities PMH includes: Paroxysmal A-fib, COPD, DM Type 2, CHF, OA of bilat knees, HTN, glaucoma of both eyes.    Examination-Activity Limitations Stairs;Squat;Stand;Locomotion Level    Examination-Participation Restrictions Community Activity    Stability/Clinical Decision Making Evolving/Moderate complexity    Rehab Potential Fair    PT Frequency 2x / week    PT Duration 8 weeks    PT Treatment/Interventions ADLs/Self Care Home Management;Cryotherapy;Gait training;Therapeutic exercise;Patient/family education;Manual techniques;Passive range of motion;Spinal Manipulations;Dry needling;Aquatic Therapy;Electrical Stimulation;Stair training;Functional mobility training;Neuromuscular re-education;Therapeutic activities;Balance training;Energy conservation;Moist Heat    PT Next Visit Plan BP, strengthening    PT Home Exercise Plan Next session    Consulted and Agree with Plan of Care Patient  Patient will benefit from skilled therapeutic intervention in order to improve the following deficits and impairments:  Abnormal gait, Improper body mechanics, Decreased mobility, Decreased activity tolerance, Decreased balance, Decreased endurance, Decreased range of motion, Decreased strength, Difficulty walking, Decreased safety awareness, Impaired vision/preception  Visit Diagnosis: Abnormality of gait and mobility  Muscle weakness (generalized)  Difficulty in walking, not elsewhere classified     Problem  List Patient Active Problem List   Diagnosis Date Noted   Bilateral primary osteoarthritis of knee 06/11/2019   Cervical radicular pain 06/11/2019   Cervical facet joint syndrome 06/11/2019   Lumbar facet arthropathy 06/11/2019   Lumbar radiculopathy 06/11/2019   Chronic pain syndrome 06/11/2019   Lung nodule 02/09/2016   Exercise hypoxemia 12/28/2015   Chronic obstructive pulmonary disease ([place]) 03/16/2015   Nuclear sclerosis 03/16/2015   OSA (obstructive sleep apnea) 03/16/2015   Primary open angle glaucoma of both eyes, severe stage 03/16/2015   Type 2 diabetes mellitus without complication, without long-term current use of insulin ([place]) 03/16/2015   Arthralgia of left lower leg 10/29/2014   Osteoarthritis of left knee 09/10/2014   Type 2 diabetes mellitus with diabetic neuropathy ([place]) 08/04/2014   Cataract, nuclear sclerotic senile 04/01/2014   Combined forms of age-related cataract 04/01/2014   Keratitis sicca, bilateral ([place]) 04/01/2014   Primary open angle glaucoma 02/27/2014   Flat feet 01/29/2014   Foot pain, bilateral 01/29/2014   Tinea pedis of both feet 01/29/2014   Ptosis of both eyelids 11/25/2013   Type 2 diabetes mellitus without retinopathy ([place]) 11/25/2013   Onychomycosis 91/69/4503   Chronic systolic heart failure ([place]) 11/11/2013   Emphysema of lung ([place]) 11/11/2013   Essential hypertension 11/11/2013   Screen for colon cancer 11/05/2013   Cardiomyopathy ([place]) 88/82/8003   Systolic HF (heart failure) ([place]) 49/17/9150   Acute systolic CHF (congestive heart failure) ([place]) 09/16/2013   COPD (chronic obstructive pulmonary disease) ([place]) 09/16/2013   HTN (hypertension) 09/16/2013   Obesity 09/16/2013   PAF (paroxysmal atrial fibrillation) ([place]) 09/16/2013   Senile nuclear sclerosis 05/22/2013   Muscle weakness (generalized) 11/11/2009   Long term current use of anticoagulant therapy 06/17/2009   Pulmonary embolism ([place]) 06/17/2009   ED (erectile  dysfunction) of organic origin 05/13/2003    Janna Arch, PT, DPT  06/07/2021, 8:40 AM  [place] MAIN [facility] SERVICES [address] [place], [place], [address] Phone: [phone]   Fax:  [phone]  Name: Kainoa Swoboda MRN: [id number] Date of Birth: 05-19-1945

## 2021-06-07 NOTE — ED Notes (Signed)
Smith, MD at bedside.  

## 2021-06-07 NOTE — ED Provider Notes (Addendum)
[facility] Provider Note    Event Date/Time   First MD Initiated Contact with Patient 06/07/21 [phone]     (approximate)   History   Chest Pain   HPI  Mario [name] is a 76 y.o. male with past medical history of arthritis, CHF, COPD, DM, HTN, HDL, pulmonary sarcoid, PE and lower extremity DVTs as well as A-fib anticoagulated on edoxaban, OSA on nocturnal 4 L nasal cannula chronically who presents for evaluation of some chest pressure he felt yesterday and elevated blood pressures today.  He states he was at physical therapy which goes to for some arthritis in his legs and his therapist told him his blood pressure was high and that he should get checked out.  He states his chest tightness occurred yesterday and only lasted a few minutes and occurred while he was playing videogames.  It resolved on its own.  He has not had any chest pain or shortness of breath the day before today.  He denies any new headache, earache, sore throat but does state that over the last 2 or 3 weeks he has had a mild nonproductive cough and some congestion.  No other acute concerns at this time.  He reports that he thinks that systolic was over [id number] at therapy this morning.  He also notes he has been out of his hydrochlorothiazide for about a week.  States he is compliant with his Cozaar.  Denies any illicit drug use or significant EtOH use.      Physical Exam  Triage Vital Signs: ED Triage Vitals [06/07/21 0832]  Enc Vitals Group     BP      Pulse      Resp      Temp      Temp src      SpO2      Weight 219 lb 12.8 oz (99.7 kg)     Height 5\' 10"  (1.778 m)     Head Circumference      Peak Flow      Pain Score 0     Pain Loc      Pain Edu?      Excl. in [place]?     Most recent vital signs: Vitals:   06/07/21 0853 06/07/21 1000  BP: (!) 175/98 (!) 165/73  Pulse: 72 77  Resp: 10 20  Temp:    SpO2: 100% 100%    General: Awake, no distress.  CV:  Good peripheral perfusion.   Resp:  Normal effort.  Abd:  No distention.   Cranial nerves II through XII grossly intact.  No pronator drift.  No finger dysmetria.  Symmetric 5/5 strength of all extremities.  Sensation intact to light touch in all extremities.     ED Results / Procedures / Treatments  Labs (all labs ordered are listed, but only abnormal results are displayed) Labs Reviewed  BASIC METABOLIC PANEL - Abnormal; Notable for the following components:      Result Value   Glucose, Bld 151 (*)    Creatinine, Ser 1.29 (*)    Calcium 8.6 (*)    GFR, Estimated 58 (*)    All other components within normal limits  CBC - Abnormal; Notable for the following components:   RBC 3.51 (*)    Hemoglobin 10.9 (*)    HCT 35.5 (*)    MCV 101.1 (*)    All other components within normal limits  RESP PANEL BY RT-PCR (FLU A&B, COVID) [id number]  TROPONIN  I (HIGH SENSITIVITY)  TROPONIN I (HIGH SENSITIVITY)     EKG  EKG is remarkable sinus rhythm with a ventricular rate of 74 and some nonspecific ST changes in lead III without other clear evidence of acute ischemia or significant arrhythmia.  RADIOLOGY Chest reviewed by myself shows no focal consoidation, effusion, edema, pneumothorax or other clear acute thoracic process. I also reviewed radiology interpretation and agree with findings described.    PROCEDURES:  Critical Care performed: No  .1-3 Lead EKG Interpretation Performed by: Lucrezia Starch, MD Authorized by: Lucrezia Starch, MD     Interpretation: non-specific     ECG rate assessment: normal     Rhythm: sinus rhythm     Ectopy: none     Conduction: normal    The patient is on the cardiac monitor to evaluate for evidence of arrhythmia and/or significant heart rate changes.   MEDICATIONS ORDERED IN ED: Medications  hydrochlorothiazide (HYDRODIURIL) tablet 12.5 mg (12.5 mg Oral Given 06/07/21 1005)     IMPRESSION / MDM / ASSESSMENT AND PLAN / ED COURSE  I reviewed the triage vital signs and  the nursing notes.                              Differential diagnosis includes, but is not limited to pneumonia, bronchitis with URI component, ACS, pericarditis, myocarditis with lower suspicion for PE as patient endorses compliance with his anticoagulation and has no evidence of new hypoxia and denies any new shortness of breath.  Overall description of symptoms  is not suggestive of a dissection.  Patient has no focal deficits or any acute symptoms at this time to suggest hypertensive emergency and never had any associated headache or other findings on history or exam to suggest [facility] for elevated blood pressures.  I did review patient's recent ED visits including with PCP on 1/25 and with cardiology on 11/17.  EKG is remarkable sinus rhythm with a ventricular rate of 74 and some nonspecific ST changes in lead III without other clear evidence of acute ischemia or significant arrhythmia.  Given nonelevated troponin obtained greater than 3 hours after symptom onset I have low suspicion for ACS or myocarditis.  CBC shows no leukocytosis and hemoglobin of 10.9.  No recent hemoglobin to compare to as the last one at 12.5 was 7 months ago.  Advised patient of this and recommendation for close outpatient further evaluation although do not believe it is contributing presentation today.  BMP shows no significant electrolyte or metabolic derangements.  Chest reviewed by myself shows no focal consoidation, effusion, edema, pneumothorax or other clear acute thoracic process. I also reviewed radiology interpretation and agree with findings described.  Low suspicion for bacterial pneumonia or maternal sinusitis.  COVID influenza PCR sent which I advised patient can follow-up online.  Respiratory symptoms are suggestive of viral URI with cough.  Overall unclear etiology for patient's pain.  Given patient's comorbidities I considered admission for further diagnostic testing observation although although given  otherwise reassuring exam work-up with low suspicion for immediately life-threatening pathology I think he is stable for discharge with close outpatient follow-up.  Refill written for hydrochlorothiazide.  Discharged in stable condition.      FINAL CLINICAL IMPRESSION(S) / ED DIAGNOSES   Final diagnoses:  Chest pain, unspecified type  Hypertension, unspecified type  Medication refill  Chronic respiratory failure with hypoxia ([place])     Rx / DC Orders   ED  Discharge Orders          Ordered    hydrochlorothiazide (HYDRODIURIL) 12.5 MG tablet  Daily        06/07/21 [id number]             Note:  This document was prepared using Dragon voice recognition software and may include unintentional dictation errors.   Lucrezia Starch, MD 06/07/21 1048    Lucrezia Starch, MD 06/07/21 1049

## 2021-06-09 ENCOUNTER — Ambulatory Visit: Payer: Medicare Other

## 2021-06-14 ENCOUNTER — Ambulatory Visit: Payer: Medicare Other

## 2021-06-16 ENCOUNTER — Ambulatory Visit: Payer: Medicare Other

## 2021-06-21 ENCOUNTER — Ambulatory Visit: Payer: Medicare Other

## 2021-06-23 ENCOUNTER — Ambulatory Visit: Payer: Medicare Other

## 2021-06-23 ENCOUNTER — Other Ambulatory Visit: Payer: Self-pay

## 2021-06-23 DIAGNOSIS — R262 Difficulty in walking, not elsewhere classified: Secondary | ICD-10-CM | POA: Insufficient documentation

## 2021-06-23 DIAGNOSIS — M6281 Muscle weakness (generalized): Secondary | ICD-10-CM | POA: Diagnosis present

## 2021-06-23 DIAGNOSIS — R269 Unspecified abnormalities of gait and mobility: Secondary | ICD-10-CM | POA: Diagnosis present

## 2021-06-23 NOTE — Therapy (Signed)
[place] [facility] SERVICES [address] [place], [place], [address] Phone: [phone]   Fax:  [phone]  Physical Therapy Treatment  Patient Details  Name: Mario [name] MRN: [id number] Date of Birth: October 11, 1945 Referring Provider (PT): [name], Victoriano Lain, NP   Encounter Date: 06/23/2021   PT End of Session - 06/23/21 0811     Visit Number 36    Number of Visits 50    Date for PT Re-Evaluation 07/19/21    Authorization Type 6/10 PN 05/10/21    PT Start Time 0800    PT Stop Time 0844    PT Time Calculation (min) 44 min    Equipment Utilized During Treatment Gait belt;Oxygen    Activity Tolerance Patient tolerated treatment well   Elevated BP readings            Past Medical History:  Diagnosis Date   Arthritis    hands, knees   Atrial fibrillation (HCC)    CHF (congestive heart failure) (HCC)    COPD (chronic obstructive pulmonary disease) (HCC)    Diabetes mellitus without complication (HCC)    Diabetic neuropathy (HCC)    Heart murmur    in past, mild 1969   Hypertension    resolved after weight loss and more activity   Neuromuscular disorder ([place])    neuropathy   Neuropathy    Pulmonary embolism ([place])    approx 2014   Shingles '99   Sleep apnea    CPAP   Wears dentures    full upper and lower    Past Surgical History:  Procedure Laterality Date   CATARACT EXTRACTION W/PHACO Left 05/12/2019   Procedure: CATARACT EXTRACTION PHACO AND INTRAOCULAR LENS PLACEMENT ([place]) LEFTDIABETIC ISTENT INJ 2.43  00:28.6;  Surgeon: Eulogio Bear, MD;  Location: [facility];  Service: Ophthalmology;  Laterality: Left;  Diabetic - oral meds sleep apnea   CATARACT EXTRACTION W/PHACO Right 06/02/2019   Procedure: CATARACT EXTRACTION PHACO AND INTRAOCULAR LENS PLACEMENT ([facility]) ISTENT INJ  RIGHT DIABETIC 1.31  00:25.4;  Surgeon: Eulogio Bear, MD;  Location: [facility];  Service: Ophthalmology;  Laterality:  Right;   COLONOSCOPY  08/30/2015   FINGER SURGERY Right    3'rd finger, right hand   KNEE SURGERY Right    screw in knee   WRIST SURGERY Left    metal removed    There were no vitals filed for this visit.   Subjective Assessment - 06/23/21 0809     Subjective Patient returning to therapy after absence from medical issues including cardiac, pulmonary, and bowel disorders.    Pertinent History Pt is a 76 y.o. male referred to OPPT for LE weakness, bilat join OA, and diabetic peripheral neuropathy. PMH includes: Paroxysmal A-fib, COPD, DM Type 2, CHF, OA of bilat knees, HTN, glaucoma of both eyes. Pt reports losing weight 265 lbs to 210 lbs and this is when he noticed he started having generalized weakness and knee pain. Has received steroid injections which temporarily helped (~1 year ago). Pt having difficulty in bowling for fun, walking to complete community tasks and ADL's (requires electric buggy for shopping). Reports walking ~ 12 meters. Pt does report NO falls besides slipping on lane with bowling. Has had near falls but has used walls to catch himself. Pt reports being on 2 L/min for COPD, denies any balance impairments. Pt's goals for PT are to return to bowling, and complete community walking tasks without having to take frequent seated  rest breaks.    Limitations Lifting;Standing;Walking;House hold activities    How long can you sit comfortably? unlimited    How long can you stand comfortably? 3 minutes    How long can you walk comfortably? length of hallway in gym (10-12 m)    Patient Stated Goals Return to community ambulation.    Currently in Pain? No/denies             Patient returning to therapy after absence from medical issues including cardiac, pulmonary, and bowel disorders.     Treatment: BP at start of session 102/89; Patient on 3L of 02 via nasal cannula.    TherEx Nustep Lvl 3, seat #9 RMP> 70 for cardiovascular and musculoskeletal challenge 4 minutes    BTB abduction 15x ; hold 3 seconds at full abduction  BTB alternating IR/ER lateral step out 15x each LE BTB alternating LAQ  15x each LE  Matrix cable hamstring curls #12.5 15x each LE; 2 sets    Matrix row with bilateral handles #27.5 15x; 2 sets   Matrix squat with chair behind patient ; cable on lowest position #12.5    Precor leg press bilateral LE"s #130 requires min A for placement of feet, cues for keeping knees from hyperextension 15x ; second and third set #145 15x; stabilization band to L knee for pain reduction ;    Precor leg press single LE #70 10x each LE ; stabilization to lateral aspect of L knee for pain reduction ; x2 sets     vitals monitored throughout PT session, SP02 dropped to 82% after leg press requiring seated rest break to return to therapeutic range.    Pt educated throughout session about proper posture and technique with exercises. Improved exercise technique, movement at target joints, use of target muscles after min to mod verbal, visual, tactile cues.         Discussed with patient about potential need for taking a break from therapy, will determine next session due to limited patient compliance with attendance with medical issues. Patient has been missing multiple weeks this year so far. . Educated patient on need to attend therapy for any significant improvements. Patient will benefit from skilled physical therapy improve patient's ambulation capacity, mobility, strength, and independence with IADLs/ADLS                PT Education - 06/23/21 0810     Education Details exercise technique, body mechanics, attendance of therapy    Person(s) Educated Patient    Methods Explanation;Demonstration;Tactile cues;Verbal cues    Comprehension Verbalized understanding;Returned demonstration;Verbal cues required;Tactile cues required              PT Short Term Goals - 05/24/21 0809       PT SHORT TERM GOAL #1   Title Pt will be  independent with HEP to improve strength, balance, endurance to improve community ADL's.    Baseline 8/2: Initiate next session 9/13: intermittent compliance 10/4: intermittent compliance 11/29: HEP compliance, new HEP given 1/24: HEP compliance    Time 4    Period Weeks    Status Achieved    Target Date 04/26/21               PT Long Term Goals - 05/24/21 0810       PT LONG TERM GOAL #1   Title Pt will increase FOTO to target to demonstrate improved ability to complete functional mobility and ADL's.    Baseline 8/2: 48/64 9/13: 57%  10/4:  57% 11/29: 51% 1/10: 51% 1/24: 60.8%    Time 8    Period Weeks    Status Partially Met    Target Date 07/19/21      PT LONG TERM GOAL #2   Title Pt will improve 5xSTS time to < 10 sec to demonstrate clinically significant improvement in BLE strength.    Baseline 8/2: 13.8 sec 9/13: 12.41 seconds 10/4: 12.3 second arms crossed 11/29: 9.7 seconds without UE support 1/24: 9.7 seconds    Time 8    Period Weeks    Status Achieved      PT LONG TERM GOAL #3   Title Pt will improve gait speed to at least 1.0 m/s to display evidenced norms to safely complete community wlaking tasks with decreased risk of falls.    Baseline 8/2: 0.678 m/s 9/13: 0.7 m/s 10/4: 0.87 m/s  11/29: 0.94 m/s with 8/10 knee pain 1/10: 0.75 m/s 1/24: 1.1 m/s    Time 8    Period Weeks    Status Achieved    Target Date 05/24/21      PT LONG TERM GOAL #4   Title Pt will improve 6MWT to at least 1000' to demo ability to complete community distance walking tasks.    Baseline 8/2: Deferred due to BP readings. 8/4: 64 ft 9/13: 120 ft in 1 min 20 seconds 10/4: 200 ft with 2 min 58 seconds 11/29: 265 ft in 3 minutes and 3 seconds with 10/10 pain 1/10: deferred due to pain 1/24: 400 ft    Time 8    Period Weeks    Status Partially Met    Target Date 07/19/21      PT LONG TERM GOAL #5   Title Patient will increase Berg Balance score by > 6 points (42/56 ) to demonstrate decreased  fall risk during functional activities.    Baseline 10/4: 36/56 11/29: 44/56    Time 8    Period Weeks    Status Achieved      Additional Long Term Goals   Additional Long Term Goals Yes      PT LONG TERM GOAL #6   Title Patient will increase Berg Balance score by > 6 points (50/56 ) to demonstrate decreased fall risk during functional activities.    Baseline 11/29: 44/56 1/10: 43/56 1/24: 46/56    Time 8    Period Weeks    Status Partially Met    Target Date 07/19/21      PT LONG TERM GOAL #7   Title Patient will report less than 7/10 pain in L knee with ambulation improving quality of life and reduced pain mobility.    Baseline 11/29; 10/10 pain with prolonged ambulation 1/10: 10/10 with ambulation 1/24: 7-8/10    Time 8    Period Weeks    Status Partially Met    Target Date 07/19/21      PT LONG TERM GOAL #8   Title Patient will be able to carry an object up the stairs with a BORG of less than 4/10 for improved capacity of mobility and quality of life.    Baseline 1/24: some days unable to carry items up stairs    Time 8    Period Weeks    Status New    Target Date 07/19/21                   Plan - 06/23/21 0816     Clinical Impression Statement Discussed  with patient about potential need for taking a break from therapy, will determine next session due to limited patient compliance with attendance with medical issues. Patient has been missing multiple weeks this year so far. . Educated patient on need to attend therapy for any significant improvements. Patient will benefit from skilled physical therapy improve patient's ambulation capacity, mobility, strength, and independence with IADLs/ADLS    Personal Factors and Comorbidities Time since onset of injury/illness/exacerbation;Age;Comorbidity 3+;Education;Past/Current Experience;Fitness    Comorbidities PMH includes: Paroxysmal A-fib, COPD, DM Type 2, CHF, OA of bilat knees, HTN, glaucoma of both eyes.     Examination-Activity Limitations Stairs;Squat;Stand;Locomotion Level    Examination-Participation Restrictions Community Activity    Stability/Clinical Decision Making Evolving/Moderate complexity    Rehab Potential Fair    PT Frequency 2x / week    PT Duration 8 weeks    PT Treatment/Interventions ADLs/Self Care Home Management;Cryotherapy;Gait training;Therapeutic exercise;Patient/family education;Manual techniques;Passive range of motion;Spinal Manipulations;Dry needling;Aquatic Therapy;Electrical Stimulation;Stair training;Functional mobility training;Neuromuscular re-education;Therapeutic activities;Balance training;Energy conservation;Moist Heat    PT Next Visit Plan BP, strengthening    PT Home Exercise Plan Next session    Consulted and Agree with Plan of Care Patient             Patient will benefit from skilled therapeutic intervention in order to improve the following deficits and impairments:  Abnormal gait, Improper body mechanics, Decreased mobility, Decreased activity tolerance, Decreased balance, Decreased endurance, Decreased range of motion, Decreased strength, Difficulty walking, Decreased safety awareness, Impaired vision/preception  Visit Diagnosis: Abnormality of gait and mobility  Muscle weakness (generalized)  Difficulty in walking, not elsewhere classified     Problem List Patient Active Problem List   Diagnosis Date Noted   Bilateral primary osteoarthritis of knee 06/11/2019   Cervical radicular pain 06/11/2019   Cervical facet joint syndrome 06/11/2019   Lumbar facet arthropathy 06/11/2019   Lumbar radiculopathy 06/11/2019   Chronic pain syndrome 06/11/2019   Lung nodule 02/09/2016   Exercise hypoxemia 12/28/2015   Chronic obstructive pulmonary disease ([place]) 03/16/2015   Nuclear sclerosis 03/16/2015   OSA (obstructive sleep apnea) 03/16/2015   Primary open angle glaucoma of both eyes, severe stage 03/16/2015   Type 2 diabetes mellitus without  complication, without long-term current use of insulin ([place]) 03/16/2015   Arthralgia of left lower leg 10/29/2014   Osteoarthritis of left knee 09/10/2014   Type 2 diabetes mellitus with diabetic neuropathy ([place]) 08/04/2014   Cataract, nuclear sclerotic senile 04/01/2014   Combined forms of age-related cataract 04/01/2014   Keratitis sicca, bilateral ([place]) 04/01/2014   Primary open angle glaucoma 02/27/2014   Flat feet 01/29/2014   Foot pain, bilateral 01/29/2014   Tinea pedis of both feet 01/29/2014   Ptosis of both eyelids 11/25/2013   Type 2 diabetes mellitus without retinopathy ([place]) 11/25/2013   Onychomycosis 14/78/2956   Chronic systolic heart failure ([place]) 11/11/2013   Emphysema of lung ([place]) 11/11/2013   Essential hypertension 11/11/2013   Screen for colon cancer 11/05/2013   Cardiomyopathy ([place]) 21/30/8657   Systolic HF (heart failure) ([place]) 84/69/6295   Acute systolic CHF (congestive heart failure) ([place]) 09/16/2013   COPD (chronic obstructive pulmonary disease) ([place]) 09/16/2013   HTN (hypertension) 09/16/2013   Obesity 09/16/2013   PAF (paroxysmal atrial fibrillation) ([place]) 09/16/2013   Senile nuclear sclerosis 05/22/2013   Muscle weakness (generalized) 11/11/2009   Long term current use of anticoagulant therapy 06/17/2009   Pulmonary embolism ([place]) 06/17/2009   ED (erectile dysfunction) of organic origin 05/13/2003    [facility]  Ermalene Postin, PT, DPT  06/23/2021, 8:45 AM  [place] [facility] SERVICES [address] [place], [place], [address] Phone: [phone]   Fax:  [phone]  Name: Mario [name] MRN: [id number] Date of Birth: [birth year]

## 2021-06-24 ENCOUNTER — Ambulatory Visit: Admit: 2021-06-24 | Discharge: 2021-06-25 | Payer: MEDICARE

## 2021-06-24 DIAGNOSIS — I152 Hypertension secondary to endocrine disorders: Principal | ICD-10-CM

## 2021-06-24 DIAGNOSIS — I2699 Other pulmonary embolism without acute cor pulmonale: Principal | ICD-10-CM

## 2021-06-24 DIAGNOSIS — D638 Anemia in other chronic diseases classified elsewhere: Principal | ICD-10-CM

## 2021-06-24 DIAGNOSIS — G4733 Obstructive sleep apnea (adult) (pediatric): Principal | ICD-10-CM

## 2021-06-24 DIAGNOSIS — L409 Psoriasis, unspecified: Principal | ICD-10-CM

## 2021-06-24 DIAGNOSIS — E1159 Type 2 diabetes mellitus with other circulatory complications: Principal | ICD-10-CM

## 2021-06-24 DIAGNOSIS — I5022 Chronic systolic (congestive) heart failure: Principal | ICD-10-CM

## 2021-06-24 DIAGNOSIS — E1169 Type 2 diabetes mellitus with other specified complication: Principal | ICD-10-CM

## 2021-06-24 DIAGNOSIS — J449 Chronic obstructive pulmonary disease, unspecified: Principal | ICD-10-CM

## 2021-06-24 DIAGNOSIS — Z1159 Encounter for screening for other viral diseases: Principal | ICD-10-CM

## 2021-06-24 DIAGNOSIS — I480 Paroxysmal atrial fibrillation: Principal | ICD-10-CM

## 2021-06-24 DIAGNOSIS — Z0000 Encounter for general adult medical examination without abnormal findings: Principal | ICD-10-CM

## 2021-06-24 DIAGNOSIS — E785 Hyperlipidemia, unspecified: Principal | ICD-10-CM

## 2021-06-24 MED ORDER — EMPAGLIFLOZIN 10 MG TABLET
ORAL_TABLET | Freq: Every day | ORAL | 3 refills | 90 days | Status: CP
Start: 2021-06-24 — End: ?

## 2021-06-28 ENCOUNTER — Ambulatory Visit: Payer: Medicare Other

## 2021-06-28 ENCOUNTER — Other Ambulatory Visit: Payer: Self-pay

## 2021-06-28 DIAGNOSIS — M6281 Muscle weakness (generalized): Secondary | ICD-10-CM

## 2021-06-28 DIAGNOSIS — R269 Unspecified abnormalities of gait and mobility: Secondary | ICD-10-CM

## 2021-06-28 DIAGNOSIS — R262 Difficulty in walking, not elsewhere classified: Secondary | ICD-10-CM

## 2021-06-28 NOTE — Therapy (Signed)
[place] MAIN [facility] SERVICES [address] [place], [place], [address] Phone: [phone]   Fax:  [phone]  Physical Therapy Treatment  Patient Details  Name: Mario [name] MRN: [id number] Date of Birth: 03-Jan-1946 Referring Provider (PT): [name], Victoriano Lain, NP   Encounter Date: 06/28/2021   PT End of Session - 06/28/21 0804     Visit Number 37    Number of Visits 50    Date for PT Re-Evaluation 07/19/21    Authorization Type 7/10 PN 05/10/21    PT Start Time 0800    PT Stop Time 0844    PT Time Calculation (min) 44 min    Equipment Utilized During Treatment Gait belt;Oxygen    Activity Tolerance Patient tolerated treatment well   Elevated BP readings            Past Medical History:  Diagnosis Date   Arthritis    hands, knees   Atrial fibrillation (HCC)    CHF (congestive heart failure) (HCC)    COPD (chronic obstructive pulmonary disease) (HCC)    Diabetes mellitus without complication (HCC)    Diabetic neuropathy (HCC)    Heart murmur    in past, mild 1969   Hypertension    resolved after weight loss and more activity   Neuromuscular disorder ([place])    neuropathy   Neuropathy    Pulmonary embolism ([place])    approx 2014   Shingles '99   Sleep apnea    CPAP   Wears dentures    full upper and lower    Past Surgical History:  Procedure Laterality Date   CATARACT EXTRACTION W/PHACO Left 05/12/2019   Procedure: CATARACT EXTRACTION PHACO AND INTRAOCULAR LENS PLACEMENT ([place]) LEFTDIABETIC ISTENT INJ 2.43  00:28.6;  Surgeon: Eulogio Bear, MD;  Location: [place];  Service: Ophthalmology;  Laterality: Left;  Diabetic - oral meds sleep apnea   CATARACT EXTRACTION W/PHACO Right 06/02/2019   Procedure: CATARACT EXTRACTION PHACO AND INTRAOCULAR LENS PLACEMENT ([facility]) ISTENT INJ  RIGHT DIABETIC 1.31  00:25.4;  Surgeon: Eulogio Bear, MD;  Location: [facility];  Service: Ophthalmology;  Laterality:  Right;   COLONOSCOPY  08/30/2015   FINGER SURGERY Right    3'rd finger, right hand   KNEE SURGERY Right    screw in knee   WRIST SURGERY Left    metal removed    There were no vitals filed for this visit.   Subjective Assessment - 06/28/21 0802     Subjective Patient reports compliance with HEP, went to the physician since last session. Will continue PT per physician request.    Pertinent History Pt is a 76 y.o. male referred to OPPT for LE weakness, bilat join OA, and diabetic peripheral neuropathy. PMH includes: Paroxysmal A-fib, COPD, DM Type 2, CHF, OA of bilat knees, HTN, glaucoma of both eyes. Pt reports losing weight 265 lbs to 210 lbs and this is when he noticed he started having generalized weakness and knee pain. Has received steroid injections which temporarily helped (~1 year ago). Pt having difficulty in bowling for fun, walking to complete community tasks and ADL's (requires electric buggy for shopping). Reports walking ~ 12 meters. Pt does report NO falls besides slipping on lane with bowling. Has had near falls but has used walls to catch himself. Pt reports being on 2 L/min for COPD, denies any balance impairments. Pt's goals for PT are to return to bowling, and complete community walking tasks without having to  take frequent seated rest breaks.    Limitations Lifting;Standing;Walking;House hold activities    How long can you sit comfortably? unlimited    How long can you stand comfortably? 3 minutes    How long can you walk comfortably? length of hallway in gym (10-12 m)    Patient Stated Goals Return to community ambulation.    Currently in Pain? No/denies                    Treatment: BP at start of session 119/82 ; Patient on 3L of 02 via nasal cannula.    TherEx Nustep Lvl 4-5, seat #9 RMP> 70 for cardiovascular and musculoskeletal challenge 5 minutes; alternating resistance for increased cardiovascular challenge.   BTB abduction 20x  Matrix cable  hamstring curls #12.5 15x each LE; 2 sets    Matrix row with bilateral handles #27.5 15x; 2 sets    TRX squat with chair behind patient 10x; 2 sets    Precor leg press bilateral LE"s #145 requires min A for placement of feet, cues for keeping knees from hyperextension 3 sets stabilization band to L knee for pain reduction ;    Precor leg press single LE #70 10x each LE ; stabilization to lateral aspect of L knee for pain reduction ; x2 sets     vitals monitored throughout PT session, SP02 dropped to 82% after leg press requiring seated rest break to return to therapeutic range.     Patient tolerates progressive physical therapy with minimal pain increase today. Patient requires intermittent rest breaks to resume therapeutic values of SP02 and HR. Patient remains highly motivated and continues to be educated on importance of compliance of attendance for progression of care. Patient is more challenged by increase weight this session requiring increased cueing for motivation. Patient will benefit from skilled physical therapy improve patient's ambulation capacity, mobility, strength, and independence with IADLs/ADLS               PT Education - 06/28/21 0803     Education Details exercise technique, body mechanics, attendance compliance    Person(s) Educated Patient    Methods Explanation;Demonstration;Tactile cues;Verbal cues    Comprehension Verbalized understanding;Returned demonstration;Verbal cues required;Tactile cues required              PT Short Term Goals - 05/24/21 0809       PT SHORT TERM GOAL #1   Title Pt will be independent with HEP to improve strength, balance, endurance to improve community ADL's.    Baseline 8/2: Initiate next session 9/13: intermittent compliance 10/4: intermittent compliance 11/29: HEP compliance, new HEP given 1/24: HEP compliance    Time 4    Period Weeks    Status Achieved    Target Date 04/26/21               PT Long  Term Goals - 05/24/21 0810       PT LONG TERM GOAL #1   Title Pt will increase FOTO to target to demonstrate improved ability to complete functional mobility and ADL's.    Baseline 8/2: 48/64 9/13: 57% 10/4:  57% 11/29: 51% 1/10: 51% 1/24: 60.8%    Time 8    Period Weeks    Status Partially Met    Target Date 07/19/21      PT LONG TERM GOAL #2   Title Pt will improve 5xSTS time to < 10 sec to demonstrate clinically significant improvement in BLE strength.    Baseline  8/2: 13.8 sec 9/13: 12.41 seconds 10/4: 12.3 second arms crossed 11/29: 9.7 seconds without UE support 1/24: 9.7 seconds    Time 8    Period Weeks    Status Achieved      PT LONG TERM GOAL #3   Title Pt will improve gait speed to at least 1.0 m/s to display evidenced norms to safely complete community wlaking tasks with decreased risk of falls.    Baseline 8/2: 0.678 m/s 9/13: 0.7 m/s 10/4: 0.87 m/s  11/29: 0.94 m/s with 8/10 knee pain 1/10: 0.75 m/s 1/24: 1.1 m/s    Time 8    Period Weeks    Status Achieved    Target Date 05/24/21      PT LONG TERM GOAL #4   Title Pt will improve 6MWT to at least 1000' to demo ability to complete community distance walking tasks.    Baseline 8/2: Deferred due to BP readings. 8/4: 64 ft 9/13: 120 ft in 1 min 20 seconds 10/4: 200 ft with 2 min 58 seconds 11/29: 265 ft in 3 minutes and 3 seconds with 10/10 pain 1/10: deferred due to pain 1/24: 400 ft    Time 8    Period Weeks    Status Partially Met    Target Date 07/19/21      PT LONG TERM GOAL #5   Title Patient will increase Berg Balance score by > 6 points (42/56 ) to demonstrate decreased fall risk during functional activities.    Baseline 10/4: 36/56 11/29: 44/56    Time 8    Period Weeks    Status Achieved      Additional Long Term Goals   Additional Long Term Goals Yes      PT LONG TERM GOAL #6   Title Patient will increase Berg Balance score by > 6 points (50/56 ) to demonstrate decreased fall risk during functional  activities.    Baseline 11/29: 44/56 1/10: 43/56 1/24: 46/56    Time 8    Period Weeks    Status Partially Met    Target Date 07/19/21      PT LONG TERM GOAL #7   Title Patient will report less than 7/10 pain in L knee with ambulation improving quality of life and reduced pain mobility.    Baseline 11/29; 10/10 pain with prolonged ambulation 1/10: 10/10 with ambulation 1/24: 7-8/10    Time 8    Period Weeks    Status Partially Met    Target Date 07/19/21      PT LONG TERM GOAL #8   Title Patient will be able to carry an object up the stairs with a BORG of less than 4/10 for improved capacity of mobility and quality of life.    Baseline 1/24: some days unable to carry items up stairs    Time 8    Period Weeks    Status New    Target Date 07/19/21                   Plan - 06/28/21 0818     Clinical Impression Statement Patient tolerates progressive physical therapy with minimal pain increase today. Patient requires intermittent rest breaks to resume therapeutic values of SP02 and HR. Patient remains highly motivated and continues to be educated on importance of compliance of attendance for progression of care. Patient is more challenged by increase weight this session requiring increased cueing for motivation. Patient will benefit from skilled physical therapy improve patient's ambulation  capacity, mobility, strength, and independence with IADLs/ADLS    Personal Factors and Comorbidities Time since onset of injury/illness/exacerbation;Age;Comorbidity 3+;Education;Past/Current Experience;Fitness    Comorbidities PMH includes: Paroxysmal A-fib, COPD, DM Type 2, CHF, OA of bilat knees, HTN, glaucoma of both eyes.    Examination-Activity Limitations Stairs;Squat;Stand;Locomotion Level    Examination-Participation Restrictions Community Activity    Stability/Clinical Decision Making Evolving/Moderate complexity    Rehab Potential Fair    PT Frequency 2x / week    PT Duration 8  weeks    PT Treatment/Interventions ADLs/Self Care Home Management;Cryotherapy;Gait training;Therapeutic exercise;Patient/family education;Manual techniques;Passive range of motion;Spinal Manipulations;Dry needling;Aquatic Therapy;Electrical Stimulation;Stair training;Functional mobility training;Neuromuscular re-education;Therapeutic activities;Balance training;Energy conservation;Moist Heat    PT Next Visit Plan BP, strengthening    PT Home Exercise Plan Next session    Consulted and Agree with Plan of Care Patient             Patient will benefit from skilled therapeutic intervention in order to improve the following deficits and impairments:  Abnormal gait, Improper body mechanics, Decreased mobility, Decreased activity tolerance, Decreased balance, Decreased endurance, Decreased range of motion, Decreased strength, Difficulty walking, Decreased safety awareness, Impaired vision/preception  Visit Diagnosis: Abnormality of gait and mobility  Muscle weakness (generalized)  Difficulty in walking, not elsewhere classified     Problem List Patient Active Problem List   Diagnosis Date Noted   Bilateral primary osteoarthritis of knee 06/11/2019   Cervical radicular pain 06/11/2019   Cervical facet joint syndrome 06/11/2019   Lumbar facet arthropathy 06/11/2019   Lumbar radiculopathy 06/11/2019   Chronic pain syndrome 06/11/2019   Lung nodule 02/09/2016   Exercise hypoxemia 12/28/2015   Chronic obstructive pulmonary disease ([place]) 03/16/2015   Nuclear sclerosis 03/16/2015   OSA (obstructive sleep apnea) 03/16/2015   Primary open angle glaucoma of both eyes, severe stage 03/16/2015   Type 2 diabetes mellitus without complication, without long-term current use of insulin ([place]) 03/16/2015   Arthralgia of left lower leg 10/29/2014   Osteoarthritis of left knee 09/10/2014   Type 2 diabetes mellitus with diabetic neuropathy ([place]) 08/04/2014   Cataract, nuclear sclerotic senile  04/01/2014   Combined forms of age-related cataract 04/01/2014   Keratitis sicca, bilateral ([place]) 04/01/2014   Primary open angle glaucoma 02/27/2014   Flat feet 01/29/2014   Foot pain, bilateral 01/29/2014   Tinea pedis of both feet 01/29/2014   Ptosis of both eyelids 11/25/2013   Type 2 diabetes mellitus without retinopathy ([place]) 11/25/2013   Onychomycosis 49/20/1007   Chronic systolic heart failure ([place]) 11/11/2013   Emphysema of lung ([place]) 11/11/2013   Essential hypertension 11/11/2013   Screen for colon cancer 11/05/2013   Cardiomyopathy ([place]) 04/19/7587   Systolic HF (heart failure) ([place]) 32/54/9826   Acute systolic CHF (congestive heart failure) ([place]) 09/16/2013   COPD (chronic obstructive pulmonary disease) ([place]) 09/16/2013   HTN (hypertension) 09/16/2013   Obesity 09/16/2013   PAF (paroxysmal atrial fibrillation) ([place]) 09/16/2013   Senile nuclear sclerosis 05/22/2013   Muscle weakness (generalized) 11/11/2009   Long term current use of anticoagulant therapy 06/17/2009   Pulmonary embolism ([place]) 06/17/2009   ED (erectile dysfunction) of organic origin 05/13/2003    Janna Arch, PT, DPT  06/28/2021, 8:44 AM  [place] MAIN [facility] SERVICES [address] [place], [place], [address] Phone: [phone]   Fax:  [phone]  Name: Mario [name] MRN: [id number] Date of Birth: [birth year]

## 2021-06-30 ENCOUNTER — Other Ambulatory Visit: Payer: Self-pay

## 2021-06-30 ENCOUNTER — Ambulatory Visit: Payer: Medicare Other | Attending: Nurse Practitioner

## 2021-06-30 DIAGNOSIS — R262 Difficulty in walking, not elsewhere classified: Secondary | ICD-10-CM | POA: Diagnosis present

## 2021-06-30 DIAGNOSIS — R269 Unspecified abnormalities of gait and mobility: Secondary | ICD-10-CM | POA: Diagnosis not present

## 2021-06-30 DIAGNOSIS — M6281 Muscle weakness (generalized): Secondary | ICD-10-CM | POA: Diagnosis present

## 2021-06-30 NOTE — Therapy (Signed)
[place] MAIN [facility] SERVICES [address] [place], [place], [address] Phone: [phone]   Fax:  [phone]  Physical Therapy Treatment  Patient Details  Name: Mario [name] MRN: [id number] Date of Birth: [birth year] Referring Provider (PT): [name], Victoriano Lain, NP   Encounter Date: 06/30/2021   PT End of Session - 06/30/21 0755     Visit Number [id number]    Number of Visits 50    Date for PT Re-Evaluation 07/19/21    Authorization Type 8/10 PN 05/10/21    PT Start Time 0759    PT Stop Time 0844    PT Time Calculation (min) 45 min    Equipment Utilized During Treatment Gait belt;Oxygen    Activity Tolerance Patient tolerated treatment well   Elevated BP readings            Past Medical History:  Diagnosis Date   Arthritis    hands, knees   Atrial fibrillation (HCC)    CHF (congestive heart failure) (HCC)    COPD (chronic obstructive pulmonary disease) (HCC)    Diabetes mellitus without complication (HCC)    Diabetic neuropathy (HCC)    Heart murmur    in past, mild 1969   Hypertension    resolved after weight loss and more activity   Neuromuscular disorder ([place])    neuropathy   Neuropathy    Pulmonary embolism ([place])    approx 2014   Shingles '99   Sleep apnea    CPAP   Wears dentures    full upper and lower    Past Surgical History:  Procedure Laterality Date   CATARACT EXTRACTION W/PHACO Left 05/12/2019   Procedure: CATARACT EXTRACTION PHACO AND INTRAOCULAR LENS PLACEMENT ([place]) LEFTDIABETIC ISTENT INJ 2.43  00:28.6;  Surgeon: Eulogio Bear, MD;  Location: [place];  Service: Ophthalmology;  Laterality: Left;  Diabetic - oral meds sleep apnea   CATARACT EXTRACTION W/PHACO Right 06/02/2019   Procedure: CATARACT EXTRACTION PHACO AND INTRAOCULAR LENS PLACEMENT ([facility]) ISTENT INJ  RIGHT DIABETIC 1.31  00:25.4;  Surgeon: Eulogio Bear, MD;  Location: [place];  Service: Ophthalmology;  Laterality:  Right;   COLONOSCOPY  08/30/2015   FINGER SURGERY Right    3'rd finger, right hand   KNEE SURGERY Right    screw in knee   WRIST SURGERY Left    metal removed    There were no vitals filed for this visit.   Subjective Assessment - 06/30/21 0823     Subjective Patient reports his sciatic pain in his L leg is radiating so bad that he was unable to walk for a while yesterday and still is struggling today.    Pertinent History Pt is a 75 y.o. male referred to OPPT for LE weakness, bilat join OA, and diabetic peripheral neuropathy. PMH includes: Paroxysmal A-fib, COPD, DM Type 2, CHF, OA of bilat knees, HTN, glaucoma of both eyes. Pt reports losing weight 265 lbs to 210 lbs and this is when he noticed he started having generalized weakness and knee pain. Has received steroid injections which temporarily helped (~1 year ago). Pt having difficulty in bowling for fun, walking to complete community tasks and ADL's (requires electric buggy for shopping). Reports walking ~ 12 meters. Pt does report NO falls besides slipping on lane with bowling. Has had near falls but has used walls to catch himself. Pt reports being on 2 L/min for COPD, denies any balance impairments. Pt's goals for PT are to return  to bowling, and complete community walking tasks without having to take frequent seated rest breaks.    Limitations Lifting;Standing;Walking;House hold activities    How long can you sit comfortably? unlimited    How long can you stand comfortably? 3 minutes    How long can you walk comfortably? length of hallway in gym (10-12 m)    Patient Stated Goals Return to community ambulation.    Currently in Pain? Yes    Pain Score 10-Worst pain ever    Pain Location Leg   and back   Pain Orientation Left    Pain Descriptors / Indicators Pounding;Radiating;Stabbing    Pain Type Acute pain;Neuropathic pain    Pain Radiating Towards L toes    Pain Onset In the past 7 days    Pain Frequency Constant                  Treatment: BP at start of session 119/81   ; Patient on 3L of 02 via nasal cannula.    Manual Hamstring lengthening LLE 60 seconds Single knee to chest LLE 60 seconds SAD with belt to LLE inferior glide distraction 4x15 second holds LAD with belt to LLE inferior glide distraction 4x 15 second holds Piriformis stretch 60 seconds   Sidelying: Roller to piriformis and lumbar paraspinals x5 minutes  TherEx: Ankle df/pf with bolster under knee 25x  Sciatic nerve glide 20x with leg on PT shoulder Adductor ball squeeze 15x 3 second holds Straight leg abduction/adduction 10x each LE  3lb ankle weight:  -LAQ 12x 3 second holds; x2 sets -march 10x each LE : single limb at a time 2 sets   Sidelying: Clamshell: 15x Reverse clamshell 10x   Pt educated throughout session about proper posture and technique with exercises. Improved exercise technique, movement at target joints, use of target muscles after min to mod verbal, visual, tactile cues.  Access Code: [email or web address] URL: [email or web address] Date: 06/30/2021 Prepared by: Janna Arch  Exercises Clamshell - 1 x daily - 7 x weekly - 2 sets - 10 reps - 5 hold Supine March - 1 x daily - 7 x weekly - 2 sets - 10 reps - 5 hold Supine Heel Slide - 1 x daily - 7 x weekly - 2 sets - 10 reps - 5 hold Supine Figure 4 Piriformis Stretch - 1 x daily - 7 x weekly - 2 sets - 2 reps - 30 hold Supine Short Arc Quad - 1 x daily - 7 x weekly - 2 sets - 10 reps - 5 hold Sidelying Reverse Clamshell - 1 x daily - 7 x weekly - 2 sets - 10 reps - 5 hold Supine Lower Trunk Rotation - 1 x daily - 7 x weekly - 2 sets - 10 reps - 5 hold Supine Sciatic Nerve Mobilization With Leg on Pillow - 1 x daily - 7 x weekly - 2 sets - 20 reps - 2 hold   Patient required complete redirection of session today as he presents with 10/10 sciatic pain affecting LLE. Pain was reduced and gentle strengthening interventions then implemented. Patient  reports decreased pain by end of session. Patient remains highly motivated despite his pain. HEP printed for supine and sidelying interventions to allow patient to perform at home when flare ups occur requiring him to be nonweightbearing. Patient will benefit from skilled physical therapy improve patient's ambulation capacity, mobility, strength, and independence with IADLs/ADLS  PT Education - 06/30/21 0755     Education Details exercise technique, body mechanics,    Person(s) Educated Patient    Methods Explanation;Demonstration;Tactile cues;Verbal cues    Comprehension Verbalized understanding;Returned demonstration;Verbal cues required;Tactile cues required              PT Short Term Goals - 05/24/21 0809       PT SHORT TERM GOAL #1   Title Pt will be independent with HEP to improve strength, balance, endurance to improve community ADL's.    Baseline 8/2: Initiate next session 9/13: intermittent compliance 10/4: intermittent compliance 11/29: HEP compliance, new HEP given 1/24: HEP compliance    Time 4    Period Weeks    Status Achieved    Target Date 04/26/21               PT Long Term Goals - 05/24/21 0810       PT LONG TERM GOAL #1   Title Pt will increase FOTO to target to demonstrate improved ability to complete functional mobility and ADL's.    Baseline 8/2: 48/64 9/13: 57% 10/4:  57% 11/29: 51% 1/10: 51% 1/24: 60.8%    Time 8    Period Weeks    Status Partially Met    Target Date 07/19/21      PT LONG TERM GOAL #2   Title Pt will improve 5xSTS time to < 10 sec to demonstrate clinically significant improvement in BLE strength.    Baseline 8/2: 13.8 sec 9/13: 12.41 seconds 10/4: 12.3 second arms crossed 11/29: 9.7 seconds without UE support 1/24: 9.7 seconds    Time 8    Period Weeks    Status Achieved      PT LONG TERM GOAL #3   Title Pt will improve gait speed to at least 1.0 m/s to display evidenced norms to safely  complete community wlaking tasks with decreased risk of falls.    Baseline 8/2: 0.678 m/s 9/13: 0.7 m/s 10/4: 0.87 m/s  11/29: 0.94 m/s with 8/10 knee pain 1/10: 0.75 m/s 1/24: 1.1 m/s    Time 8    Period Weeks    Status Achieved    Target Date 05/24/21      PT LONG TERM GOAL #4   Title Pt will improve 6MWT to at least 1000' to demo ability to complete community distance walking tasks.    Baseline 8/2: Deferred due to BP readings. 8/4: 64 ft 9/13: 120 ft in 1 min 20 seconds 10/4: 200 ft with 2 min 58 seconds 11/29: 265 ft in 3 minutes and 3 seconds with 10/10 pain 1/10: deferred due to pain 1/24: 400 ft    Time 8    Period Weeks    Status Partially Met    Target Date 07/19/21      PT LONG TERM GOAL #5   Title Patient will increase Berg Balance score by > 6 points (42/56 ) to demonstrate decreased fall risk during functional activities.    Baseline 10/4: 36/56 11/29: 44/56    Time 8    Period Weeks    Status Achieved      Additional Long Term Goals   Additional Long Term Goals Yes      PT LONG TERM GOAL #6   Title Patient will increase Berg Balance score by > 6 points (50/56 ) to demonstrate decreased fall risk during functional activities.    Baseline 11/29: 44/56 1/10: 43/56 1/24: 46/56    Time 8  Period Weeks    Status Partially Met    Target Date 07/19/21      PT LONG TERM GOAL #7   Title Patient will report less than 7/10 pain in L knee with ambulation improving quality of life and reduced pain mobility.    Baseline 11/29; 10/10 pain with prolonged ambulation 1/10: 10/10 with ambulation 1/24: 7-8/10    Time 8    Period Weeks    Status Partially Met    Target Date 07/19/21      PT LONG TERM GOAL #8   Title Patient will be able to carry an object up the stairs with a BORG of less than 4/10 for improved capacity of mobility and quality of life.    Baseline 1/24: some days unable to carry items up stairs    Time 8    Period Weeks    Status New    Target Date 07/19/21                    Plan - 06/30/21 0835     Clinical Impression Statement Patient required complete redirection of session today as he presents with 10/10 sciatic pain affecting LLE. Pain was reduced and gentle strengthening interventions then implemented. Patient reports decreased pain by end of session. Patient remains highly motivated despite his pain. HEP printed for supine and sidelying interventions to allow patient to perform at home when flare ups occur requiring him to be nonweightbearing. Patient will benefit from skilled physical therapy improve patient's ambulation capacity, mobility, strength, and independence with IADLs/ADLS    Personal Factors and Comorbidities Time since onset of injury/illness/exacerbation;Age;Comorbidity 3+;Education;Past/Current Experience;Fitness    Comorbidities PMH includes: Paroxysmal A-fib, COPD, DM Type 2, CHF, OA of bilat knees, HTN, glaucoma of both eyes.    Examination-Activity Limitations Stairs;Squat;Stand;Locomotion Level    Examination-Participation Restrictions Community Activity    Stability/Clinical Decision Making Evolving/Moderate complexity    Rehab Potential Fair    PT Frequency 2x / week    PT Duration 8 weeks    PT Treatment/Interventions ADLs/Self Care Home Management;Cryotherapy;Gait training;Therapeutic exercise;Patient/family education;Manual techniques;Passive range of motion;Spinal Manipulations;Dry needling;Aquatic Therapy;Electrical Stimulation;Stair training;Functional mobility training;Neuromuscular re-education;Therapeutic activities;Balance training;Energy conservation;Moist Heat    PT Next Visit Plan BP, strengthening    PT Home Exercise Plan Next session    Consulted and Agree with Plan of Care Patient             Patient will benefit from skilled therapeutic intervention in order to improve the following deficits and impairments:  Abnormal gait, Improper body mechanics, Decreased mobility, Decreased activity  tolerance, Decreased balance, Decreased endurance, Decreased range of motion, Decreased strength, Difficulty walking, Decreased safety awareness, Impaired vision/preception  Visit Diagnosis: Abnormality of gait and mobility  Muscle weakness (generalized)  Difficulty in walking, not elsewhere classified     Problem List Patient Active Problem List   Diagnosis Date Noted   Bilateral primary osteoarthritis of knee 06/11/2019   Cervical radicular pain 06/11/2019   Cervical facet joint syndrome 06/11/2019   Lumbar facet arthropathy 06/11/2019   Lumbar radiculopathy 06/11/2019   Chronic pain syndrome 06/11/2019   Lung nodule 02/09/2016   Exercise hypoxemia 12/28/2015   Chronic obstructive pulmonary disease ([place]) 03/16/2015   Nuclear sclerosis 03/16/2015   OSA (obstructive sleep apnea) 03/16/2015   Primary open angle glaucoma of both eyes, severe stage 03/16/2015   Type 2 diabetes mellitus without complication, without long-term current use of insulin ([place]) 03/16/2015   Arthralgia of left lower leg 10/29/2014  Osteoarthritis of left knee 09/10/2014   Type 2 diabetes mellitus with diabetic neuropathy ([place]) 08/04/2014   Cataract, nuclear sclerotic senile 04/01/2014   Combined forms of age-related cataract 04/01/2014   Keratitis sicca, bilateral ([place]) 04/01/2014   Primary open angle glaucoma 02/27/2014   Flat feet 01/29/2014   Foot pain, bilateral 01/29/2014   Tinea pedis of both feet 01/29/2014   Ptosis of both eyelids 11/25/2013   Type 2 diabetes mellitus without retinopathy ([place]) 11/25/2013   Onychomycosis 01/58/6825   Chronic systolic heart failure ([place]) 11/11/2013   Emphysema of lung ([place]) 11/11/2013   Essential hypertension 11/11/2013   Screen for colon cancer 11/05/2013   Cardiomyopathy ([place]) 74/93/5521   Systolic HF (heart failure) ([place]) 74/71/5953   Acute systolic CHF (congestive heart failure) ([place]) 09/16/2013   COPD (chronic obstructive pulmonary disease) ([place])  09/16/2013   HTN (hypertension) 09/16/2013   Obesity 09/16/2013   PAF (paroxysmal atrial fibrillation) ([place]) 09/16/2013   Senile nuclear sclerosis 05/22/2013   Muscle weakness (generalized) 11/11/2009   Long term current use of anticoagulant therapy 06/17/2009   Pulmonary embolism ([place]) 06/17/2009   ED (erectile dysfunction) of organic origin 05/13/2003    Janna Arch, PT, DPT  06/30/2021, 9:02 AM  [place] MAIN [facility] SERVICES [address] [place], [place], [address] Phone: [phone]   Fax:  [phone]  Name: Mario [name] MRN: [id number] Date of Birth: 05-Apr-1946

## 2021-07-04 DIAGNOSIS — D863 Sarcoidosis of skin: Principal | ICD-10-CM

## 2021-07-05 ENCOUNTER — Other Ambulatory Visit: Payer: Self-pay

## 2021-07-05 ENCOUNTER — Ambulatory Visit: Payer: Medicare Other

## 2021-07-05 DIAGNOSIS — R269 Unspecified abnormalities of gait and mobility: Secondary | ICD-10-CM | POA: Diagnosis not present

## 2021-07-05 DIAGNOSIS — M6281 Muscle weakness (generalized): Secondary | ICD-10-CM

## 2021-07-05 DIAGNOSIS — R262 Difficulty in walking, not elsewhere classified: Secondary | ICD-10-CM

## 2021-07-05 NOTE — Therapy (Signed)
[place] MAIN [facility] SERVICES [address] [place], [place], [address] Phone: [phone]   Fax:  [phone]  Physical Therapy Treatment  Patient Details  Name: Mario [name] MRN: [id number] Date of Birth: 06/29/1945 Referring Provider (PT): [name], Victoriano Lain, NP   Encounter Date: 07/05/2021   PT End of Session - 07/05/21 0807     Visit Number 39    Number of Visits 50    Date for PT Re-Evaluation 07/19/21    Authorization Type 9/10 PN 05/10/21    PT Start Time 0800    PT Stop Time 0844    PT Time Calculation (min) 44 min    Equipment Utilized During Treatment Gait belt;Oxygen    Activity Tolerance Patient tolerated treatment well   Elevated BP readings            Past Medical History:  Diagnosis Date   Arthritis    hands, knees   Atrial fibrillation (HCC)    CHF (congestive heart failure) (HCC)    COPD (chronic obstructive pulmonary disease) (HCC)    Diabetes mellitus without complication (HCC)    Diabetic neuropathy (HCC)    Heart murmur    in past, mild 1969   Hypertension    resolved after weight loss and more activity   Neuromuscular disorder ([place])    neuropathy   Neuropathy    Pulmonary embolism ([place])    approx 2014   Shingles '99   Sleep apnea    CPAP   Wears dentures    full upper and lower    Past Surgical History:  Procedure Laterality Date   CATARACT EXTRACTION W/PHACO Left 05/12/2019   Procedure: CATARACT EXTRACTION PHACO AND INTRAOCULAR LENS PLACEMENT ([place]) LEFTDIABETIC ISTENT INJ 2.43  00:28.6;  Surgeon: Eulogio Bear, MD;  Location: [place];  Service: Ophthalmology;  Laterality: Left;  Diabetic - oral meds sleep apnea   CATARACT EXTRACTION W/PHACO Right 06/02/2019   Procedure: CATARACT EXTRACTION PHACO AND INTRAOCULAR LENS PLACEMENT ([facility]) ISTENT INJ  RIGHT DIABETIC 1.31  00:25.4;  Surgeon: Eulogio Bear, MD;  Location: [place];  Service: Ophthalmology;  Laterality:  Right;   COLONOSCOPY  08/30/2015   FINGER SURGERY Right    3'rd finger, right hand   KNEE SURGERY Right    screw in knee   WRIST SURGERY Left    metal removed    There were no vitals filed for this visit.   Subjective Assessment - 07/05/21 0805     Subjective Patient reports his sciatica is still present. Goes to cardiologist and pulmonologist this week.    Pertinent History Pt is a 76 y.o. male referred to OPPT for LE weakness, bilat join OA, and diabetic peripheral neuropathy. PMH includes: Paroxysmal A-fib, COPD, DM Type 2, CHF, OA of bilat knees, HTN, glaucoma of both eyes. Pt reports losing weight 265 lbs to 210 lbs and this is when he noticed he started having generalized weakness and knee pain. Has received steroid injections which temporarily helped (~1 year ago). Pt having difficulty in bowling for fun, walking to complete community tasks and ADL's (requires electric buggy for shopping). Reports walking ~ 12 meters. Pt does report NO falls besides slipping on lane with bowling. Has had near falls but has used walls to catch himself. Pt reports being on 2 L/min for COPD, denies any balance impairments. Pt's goals for PT are to return to bowling, and complete community walking tasks without having to take frequent seated rest  breaks.    Limitations Lifting;Standing;Walking;House hold activities    How long can you sit comfortably? unlimited    How long can you stand comfortably? 3 minutes    How long can you walk comfortably? length of hallway in gym (10-12 m)    Patient Stated Goals Return to community ambulation.    Currently in Pain? Yes    Pain Score 5     Pain Location Leg    Pain Orientation Left    Pain Descriptors / Indicators Aching    Pain Type Acute pain    Pain Onset In the past 7 days               BP at start of session: 116/69       TherEx Nustep Lvl 4-5, seat #9 RMP> 70 for cardiovascular and musculoskeletal challenge 5 minutes; alternating  resistance for increased cardiovascular challenge.    TRX squat with chair behind patient 10x; 2 sets    Precor leg press bilateral LE"s #145 requires min A for placement of feet, cues for keeping knees from hyperextension 2 sets stabilization band to L knee for pain reduction ;    Precor leg press single LE #70 10x each LE ; stabilization to lateral aspect of L knee for pain reduction ; x2 sets  Seated: BTB abduction 20x  Neuro Re-ed: Standing with CGA next to support surface:  Airex pad: static stand 30 seconds x 2 trials, noticeable trembling of ankles/LE's with fatigue and challenge to maintain stability Airex pad: horizontal head turns 30 seconds scanning room 10x ; cueing for arc of motion  Airex pad: vertical head turns 30 seconds, cueing for arc of motion, noticeable sway with upward gaze increasing demand on ankle righting reaction musculature Airex pad: one foot on 6" step one foot on airex pad, hold position for 30 seconds, switch legs, 2x each LE;     Pt educated throughout session about proper posture and technique with exercises. Improved exercise technique, movement at target joints, use of target muscles after min to mod verbal, visual, tactile cues.                       PT Education - 07/05/21 0806     Education Details exercise technique, body mechanics    Person(s) Educated Patient    Methods Explanation;Demonstration;Tactile cues;Verbal cues    Comprehension Verbalized understanding;Returned demonstration;Verbal cues required;Tactile cues required              PT Short Term Goals - 05/24/21 0809       PT SHORT TERM GOAL #1   Title Pt will be independent with HEP to improve strength, balance, endurance to improve community ADL's.    Baseline 8/2: Initiate next session 9/13: intermittent compliance 10/4: intermittent compliance 11/29: HEP compliance, new HEP given 1/24: HEP compliance    Time 4    Period Weeks    Status Achieved     Target Date 04/26/21               PT Long Term Goals - 05/24/21 0810       PT LONG TERM GOAL #1   Title Pt will increase FOTO to target to demonstrate improved ability to complete functional mobility and ADL's.    Baseline 8/2: 48/64 9/13: 57% 10/4:  57% 11/29: 51% 1/10: 51% 1/24: 60.8%    Time 8    Period Weeks    Status Partially Met  Target Date 07/19/21      PT LONG TERM GOAL #2   Title Pt will improve 5xSTS time to < 10 sec to demonstrate clinically significant improvement in BLE strength.    Baseline 8/2: 13.8 sec 9/13: 12.41 seconds 10/4: 12.3 second arms crossed 11/29: 9.7 seconds without UE support 1/24: 9.7 seconds    Time 8    Period Weeks    Status Achieved      PT LONG TERM GOAL #3   Title Pt will improve gait speed to at least 1.0 m/s to display evidenced norms to safely complete community wlaking tasks with decreased risk of falls.    Baseline 8/2: 0.678 m/s 9/13: 0.7 m/s 10/4: 0.87 m/s  11/29: 0.94 m/s with 8/10 knee pain 1/10: 0.75 m/s 1/24: 1.1 m/s    Time 8    Period Weeks    Status Achieved    Target Date 05/24/21      PT LONG TERM GOAL #4   Title Pt will improve 6MWT to at least 1000' to demo ability to complete community distance walking tasks.    Baseline 8/2: Deferred due to BP readings. 8/4: 64 ft 9/13: 120 ft in 1 min 20 seconds 10/4: 200 ft with 2 min 58 seconds 11/29: 265 ft in 3 minutes and 3 seconds with 10/10 pain 1/10: deferred due to pain 1/24: 400 ft    Time 8    Period Weeks    Status Partially Met    Target Date 07/19/21      PT LONG TERM GOAL #5   Title Patient will increase Berg Balance score by > 6 points (42/56 ) to demonstrate decreased fall risk during functional activities.    Baseline 10/4: 36/56 11/29: 44/56    Time 8    Period Weeks    Status Achieved      Additional Long Term Goals   Additional Long Term Goals Yes      PT LONG TERM GOAL #6   Title Patient will increase Berg Balance score by > 6 points (50/56 ) to  demonstrate decreased fall risk during functional activities.    Baseline 11/29: 44/56 1/10: 43/56 1/24: 46/56    Time 8    Period Weeks    Status Partially Met    Target Date 07/19/21      PT LONG TERM GOAL #7   Title Patient will report less than 7/10 pain in L knee with ambulation improving quality of life and reduced pain mobility.    Baseline 11/29; 10/10 pain with prolonged ambulation 1/10: 10/10 with ambulation 1/24: 7-8/10    Time 8    Period Weeks    Status Partially Met    Target Date 07/19/21      PT LONG TERM GOAL #8   Title Patient will be able to carry an object up the stairs with a BORG of less than 4/10 for improved capacity of mobility and quality of life.    Baseline 1/24: some days unable to carry items up stairs    Time 8    Period Weeks    Status New    Target Date 07/19/21                   Plan - 07/05/21 0850     Clinical Impression Statement Patient presents with good motivation throughout physical therapy session. His oxygen is monitored throughout session on 3 L via nasal cannula with SP02 > 95%. Patient has intermittent inferior  knee pain throughout session. He requires intermittent cueing for task orientation and sequencing. Unstable surfaces are very challenging for patient to perform. He has frequent posterior LOB on unstable surfaces and requires occasional assistance to return to COM.Patient is aware that next session is his progress note. Patient will benefit from skilled physical therapy improve patient's ambulation capacity, mobility, strength, and independence with IADLs/ADLS    Personal Factors and Comorbidities Time since onset of injury/illness/exacerbation;Age;Comorbidity 3+;Education;Past/Current Experience;Fitness    Comorbidities PMH includes: Paroxysmal A-fib, COPD, DM Type 2, CHF, OA of bilat knees, HTN, glaucoma of both eyes.    Examination-Activity Limitations Stairs;Squat;Stand;Locomotion Level    Examination-Participation  Restrictions Community Activity    Stability/Clinical Decision Making Evolving/Moderate complexity    Rehab Potential Fair    PT Frequency 2x / week    PT Duration 8 weeks    PT Treatment/Interventions ADLs/Self Care Home Management;Cryotherapy;Gait training;Therapeutic exercise;Patient/family education;Manual techniques;Passive range of motion;Spinal Manipulations;Dry needling;Aquatic Therapy;Electrical Stimulation;Stair training;Functional mobility training;Neuromuscular re-education;Therapeutic activities;Balance training;Energy conservation;Moist Heat    PT Next Visit Plan BP, strengthening    PT Home Exercise Plan Next session    Consulted and Agree with Plan of Care Patient             Patient will benefit from skilled therapeutic intervention in order to improve the following deficits and impairments:  Abnormal gait, Improper body mechanics, Decreased mobility, Decreased activity tolerance, Decreased balance, Decreased endurance, Decreased range of motion, Decreased strength, Difficulty walking, Decreased safety awareness, Impaired vision/preception  Visit Diagnosis: Abnormality of gait and mobility  Muscle weakness (generalized)  Difficulty in walking, not elsewhere classified     Problem List Patient Active Problem List   Diagnosis Date Noted   Bilateral primary osteoarthritis of knee 06/11/2019   Cervical radicular pain 06/11/2019   Cervical facet joint syndrome 06/11/2019   Lumbar facet arthropathy 06/11/2019   Lumbar radiculopathy 06/11/2019   Chronic pain syndrome 06/11/2019   Lung nodule 02/09/2016   Exercise hypoxemia 12/28/2015   Chronic obstructive pulmonary disease ([place]) 03/16/2015   Nuclear sclerosis 03/16/2015   OSA (obstructive sleep apnea) 03/16/2015   Primary open angle glaucoma of both eyes, severe stage 03/16/2015   Type 2 diabetes mellitus without complication, without long-term current use of insulin ([place]) 03/16/2015   Arthralgia of left lower  leg 10/29/2014   Osteoarthritis of left knee 09/10/2014   Type 2 diabetes mellitus with diabetic neuropathy ([place]) 08/04/2014   Cataract, nuclear sclerotic senile 04/01/2014   Combined forms of age-related cataract 04/01/2014   Keratitis sicca, bilateral ([place]) 04/01/2014   Primary open angle glaucoma 02/27/2014   Flat feet 01/29/2014   Foot pain, bilateral 01/29/2014   Tinea pedis of both feet 01/29/2014   Ptosis of both eyelids 11/25/2013   Type 2 diabetes mellitus without retinopathy ([place]) 11/25/2013   Onychomycosis 73/22/0254   Chronic systolic heart failure ([place]) 11/11/2013   Emphysema of lung ([place]) 11/11/2013   Essential hypertension 11/11/2013   Screen for colon cancer 11/05/2013   Cardiomyopathy ([place]) 27/09/2374   Systolic HF (heart failure) ([place]) 28/31/5176   Acute systolic CHF (congestive heart failure) ([place]) 09/16/2013   COPD (chronic obstructive pulmonary disease) ([place]) 09/16/2013   HTN (hypertension) 09/16/2013   Obesity 09/16/2013   PAF (paroxysmal atrial fibrillation) ([place]) 09/16/2013   Senile nuclear sclerosis 05/22/2013   Muscle weakness (generalized) 11/11/2009   Long term current use of anticoagulant therapy 06/17/2009   Pulmonary embolism ([place]) 06/17/2009   ED (erectile dysfunction) of organic origin 05/13/2003   Janna Arch,  PT, DPT  07/05/2021, 8:52 AM  [place] MAIN [facility] SERVICES [address] [place], [place], [address] Phone: [phone]   Fax:  [phone]  Name: Mario [name] MRN: [id number] Date of Birth: 1945-09-24

## 2021-07-07 ENCOUNTER — Ambulatory Visit: Payer: Medicare Other

## 2021-07-07 ENCOUNTER — Other Ambulatory Visit: Payer: Self-pay

## 2021-07-07 DIAGNOSIS — R262 Difficulty in walking, not elsewhere classified: Secondary | ICD-10-CM

## 2021-07-07 DIAGNOSIS — R269 Unspecified abnormalities of gait and mobility: Secondary | ICD-10-CM | POA: Diagnosis not present

## 2021-07-07 DIAGNOSIS — M6281 Muscle weakness (generalized): Secondary | ICD-10-CM

## 2021-07-07 NOTE — Therapy (Signed)
[place] MAIN [facility] SERVICES [address] [place], [place], [address] Phone: [phone]   Fax:  [phone]  Physical Therapy Treatment Physical Therapy Progress Note   Dates of reporting period  05/10/21   to   07/07/21   Patient Details  Name: Mario [name] MRN: [id number] Date of Birth: [birth year] Referring Provider (PT): [name], Victoriano Lain, NP   Encounter Date: 07/07/2021   PT End of Session - 07/07/21 0758     Visit Number 40    Number of Visits 50    Date for PT Re-Evaluation 07/19/21    Authorization Type 10/10 PN 05/10/21; next session 1/10 PN 3/9    PT Start Time 0800    PT Stop Time 0844    PT Time Calculation (min) 44 min    Equipment Utilized During Treatment Gait belt;Oxygen    Activity Tolerance Patient tolerated treatment well   Elevated BP readings            Past Medical History:  Diagnosis Date   Arthritis    hands, knees   Atrial fibrillation (HCC)    CHF (congestive heart failure) (HCC)    COPD (chronic obstructive pulmonary disease) (HCC)    Diabetes mellitus without complication (HCC)    Diabetic neuropathy (HCC)    Heart murmur    in past, mild 1969   Hypertension    resolved after weight loss and more activity   Neuromuscular disorder ([place])    neuropathy   Neuropathy    Pulmonary embolism ([place])    approx 2014   Shingles '99   Sleep apnea    CPAP   Wears dentures    full upper and lower    Past Surgical History:  Procedure Laterality Date   CATARACT EXTRACTION W/PHACO Left 05/12/2019   Procedure: CATARACT EXTRACTION PHACO AND INTRAOCULAR LENS PLACEMENT ([place]) LEFTDIABETIC ISTENT INJ 2.43  00:28.6;  Surgeon: Eulogio Bear, MD;  Location: [place];  Service: Ophthalmology;  Laterality: Left;  Diabetic - oral meds sleep apnea   CATARACT EXTRACTION W/PHACO Right 06/02/2019   Procedure: CATARACT EXTRACTION PHACO AND INTRAOCULAR LENS PLACEMENT (IOC) ISTENT INJ  RIGHT DIABETIC 1.31   00:25.4;  Surgeon: Eulogio Bear, MD;  Location: [place];  Service: Ophthalmology;  Laterality: Right;   COLONOSCOPY  08/30/2015   FINGER SURGERY Right    3'rd finger, right hand   KNEE SURGERY Right    screw in knee   WRIST SURGERY Left    metal removed    There were no vitals filed for this visit.   Subjective Assessment - 07/07/21 0802     Subjective Patient had a fall in the bathroom after last session, hit his head when he fell into the bathtub.  Goes to the cardiologist and pulmonologist this week. Reports his sciatic nerve is bothering him today.    Pertinent History Pt is a 76 y.o. male referred to OPPT for LE weakness, bilat join OA, and diabetic peripheral neuropathy. PMH includes: Paroxysmal A-fib, COPD, DM Type 2, CHF, OA of bilat knees, HTN, glaucoma of both eyes. Pt reports losing weight 265 lbs to 210 lbs and this is when he noticed he started having generalized weakness and knee pain. Has received steroid injections which temporarily helped (~1 year ago). Pt having difficulty in bowling for fun, walking to complete community tasks and ADL's (requires electric buggy for shopping). Reports walking ~ 12 meters. Pt does report NO falls besides slipping on lane  with bowling. Has had near falls but has used walls to catch himself. Pt reports being on 2 L/min for COPD, denies any balance impairments. Pt's goals for PT are to return to bowling, and complete community walking tasks without having to take frequent seated rest breaks.    Limitations Lifting;Standing;Walking;House hold activities    How long can you sit comfortably? unlimited    How long can you stand comfortably? 3 minutes    How long can you walk comfortably? length of hallway in gym (10-12 m)    Patient Stated Goals Return to community ambulation.    Currently in Pain? Yes    Pain Score 5     Pain Location Leg    Pain Orientation Left    Pain Descriptors / Indicators Aching    Pain Type Chronic pain     Pain Onset In the past 7 days    Pain Frequency Constant                [facility] PT Assessment - 07/07/21 0001       Standardized Balance Assessment   Standardized Balance Assessment Berg Balance Test      Berg Balance Test   Sit to Stand Able to stand without using hands and stabilize independently    Standing Unsupported Able to stand safely 2 minutes    Sitting with Back Unsupported but Feet Supported on Floor or Stool Able to sit safely and securely 2 minutes    Stand to Sit Sits safely with minimal use of hands    Transfers Able to transfer safely, minor use of hands    Standing Unsupported with Eyes Closed Able to stand 10 seconds with supervision    Standing Unsupported with Feet Together Able to place feet together independently and stand for 1 minute with supervision    From Standing, Reach Forward with Outstretched Arm Can reach forward >12 cm safely (5")    From Standing Position, Pick up Object from Floor Able to pick up shoe, needs supervision    From Standing Position, Turn to Look Behind Over each Shoulder Looks behind one side only/other side shows less weight shift    Turn 360 Degrees Needs close supervision or verbal cueing    Standing Unsupported, Alternately Place Feet on Step/Stool Able to complete >2 steps/needs minimal assist    Standing Unsupported, One Foot in Front Needs help to step but can hold 15 seconds    Standing on One Leg Unable to try or needs assist to prevent fall    Total Score 38            BP at start of session: 122/87   Goals:  FOTO: 67%  6 MWT : 400 ft stop at 4 minutes 45 seconds.  BERG: 38/56 VAS: 7/10 Carry objects up stairs: impacted by sciatic nerve; hasn't been able to do as well but not impacted from knees.   Treatment:  Four way ankle strengthening 10x each direction each LE  GTB alternating LAQ 20x each LE   Patient's condition has the potential to improve in response to therapy. Maximum improvement is yet to be  obtained. The anticipated improvement is attainable and reasonable in a generally predictable time.  Patient reports he is not moving as well as he would like. His sciatica is impacting him right now.   Patient had multiple near LOB this session requiring stabilization from PT to remain upright. Patient has significant decline in his stability since last tested in  January potentially due to sciatic issues in addition to limited compliance with attendance of therapy sessions and HEP. Patient aware that he must improve his scores by end of the month in order to continue therapy. Patient's condition has the potential to improve in response to therapy. Maximum improvement is yet to be obtained. The anticipated improvement is attainable and reasonable in a generally predictable time. Patient will benefit from skilled physical therapy improve patient's ambulation capacity, mobility, strength, and independence with IADLs/ADLS                     PT Education - 07/07/21 0758     Education Details goals, progress note    Person(s) Educated Patient    Methods Explanation;Demonstration;Tactile cues;Verbal cues    Comprehension Verbalized understanding;Returned demonstration;Other (comment);Verbal cues required;Tactile cues required              PT Short Term Goals - 05/24/21 0809       PT SHORT TERM GOAL #1   Title Pt will be independent with HEP to improve strength, balance, endurance to improve community ADL's.    Baseline 8/2: Initiate next session 9/13: intermittent compliance 10/4: intermittent compliance 11/29: HEP compliance, new HEP given 1/24: HEP compliance    Time 4    Period Weeks    Status Achieved    Target Date 04/26/21               PT Long Term Goals - 07/07/21 0816       PT LONG TERM GOAL #1   Title Pt will increase FOTO to target to demonstrate improved ability to complete functional mobility and ADL's.    Baseline 8/2: 48/64 9/13: 57% 10/4:  57%  11/29: 51% 1/10: 51% 1/24: 60.8% 3/9: 67%    Time 8    Period Weeks    Status Partially Met    Target Date 07/19/21      PT LONG TERM GOAL #2   Title Pt will improve 5xSTS time to < 10 sec to demonstrate clinically significant improvement in BLE strength.    Baseline 8/2: 13.8 sec 9/13: 12.41 seconds 10/4: 12.3 second arms crossed 11/29: 9.7 seconds without UE support 1/24: 9.7 seconds    Time 8    Period Weeks    Status Achieved      PT LONG TERM GOAL #3   Title Pt will improve gait speed to at least 1.0 m/s to display evidenced norms to safely complete community wlaking tasks with decreased risk of falls.    Baseline 8/2: 0.678 m/s 9/13: 0.7 m/s 10/4: 0.87 m/s  11/29: 0.94 m/s with 8/10 knee pain 1/10: 0.75 m/s 1/24: 1.1 m/s    Time 8    Period Weeks    Status Achieved    Target Date 05/24/21      PT LONG TERM GOAL #4   Title Pt will improve 6MWT to at least 1000' to demo ability to complete community distance walking tasks.    Baseline 8/2: Deferred due to BP readings. 8/4: 64 ft 9/13: 120 ft in 1 min 20 seconds 10/4: 200 ft with 2 min 58 seconds 11/29: 265 ft in 3 minutes and 3 seconds with 10/10 pain 1/10: deferred due to pain 1/24: 400 ft 3/9: 400 ft    Time 8    Period Weeks    Status Partially Met    Target Date 07/19/21      PT LONG TERM GOAL #5   Title  Patient will increase Berg Balance score by > 6 points (42/56 ) to demonstrate decreased fall risk during functional activities.    Baseline 10/4: 36/56 11/29: 44/56    Time 8    Period Weeks    Status Achieved      PT LONG TERM GOAL #6   Title Patient will increase Berg Balance score by > 6 points (50/56 ) to demonstrate decreased fall risk during functional activities.    Baseline 11/29: 44/56 1/10: 43/56 1/24: 46/56 3/9: 38/56    Time 8    Period Weeks    Status Partially Met    Target Date 07/19/21      PT LONG TERM GOAL #7   Title Patient will report less than 7/10 pain in L knee with ambulation improving  quality of life and reduced pain mobility.    Baseline 11/29; 10/10 pain with prolonged ambulation 1/10: 10/10 with ambulation 1/24: 7-8/10 3/9: 7/10    Time 8    Period Weeks    Status Partially Met    Target Date 07/19/21      PT LONG TERM GOAL #8   Title Patient will be able to carry an object up the stairs with a BORG of less than 4/10 for improved capacity of mobility and quality of life.    Baseline 1/24: some days unable to carry items up stairs 3/9: impacted by sciatic nerve right now; unable to perform    Time 8    Period Weeks    Status On-going    Target Date 07/19/21                   Plan - 07/07/21 0840     Clinical Impression Statement Patient had multiple near LOB this session requiring stabilization from PT to remain upright. Patient has significant decline in his stability since last tested in January potentially due to sciatic issues in addition to limited compliance with attendance of therapy sessions and HEP. Patient aware that he must improve his scores by end of the month in order to continue therapy. Patient's condition has the potential to improve in response to therapy. Maximum improvement is yet to be obtained. The anticipated improvement is attainable and reasonable in a generally predictable time. Patient will benefit from skilled physical therapy improve patient's ambulation capacity, mobility, strength, and independence with IADLs/ADLS    Personal Factors and Comorbidities Time since onset of injury/illness/exacerbation;Age;Comorbidity 3+;Education;Past/Current Experience;Fitness    Comorbidities PMH includes: Paroxysmal A-fib, COPD, DM Type 2, CHF, OA of bilat knees, HTN, glaucoma of both eyes.    Examination-Activity Limitations Stairs;Squat;Stand;Locomotion Level    Examination-Participation Restrictions Community Activity    Stability/Clinical Decision Making Evolving/Moderate complexity    Rehab Potential Fair    PT Frequency 2x / week    PT  Duration 8 weeks    PT Treatment/Interventions ADLs/Self Care Home Management;Cryotherapy;Gait training;Therapeutic exercise;Patient/family education;Manual techniques;Passive range of motion;Spinal Manipulations;Dry needling;Aquatic Therapy;Electrical Stimulation;Stair training;Functional mobility training;Neuromuscular re-education;Therapeutic activities;Balance training;Energy conservation;Moist Heat    PT Next Visit Plan BP, strengthening    PT Home Exercise Plan Next session    Consulted and Agree with Plan of Care Patient             Patient will benefit from skilled therapeutic intervention in order to improve the following deficits and impairments:  Abnormal gait, Improper body mechanics, Decreased mobility, Decreased activity tolerance, Decreased balance, Decreased endurance, Decreased range of motion, Decreased strength, Difficulty walking, Decreased safety awareness, Impaired vision/preception  Visit  Diagnosis: Abnormality of gait and mobility  Muscle weakness (generalized)  Difficulty in walking, not elsewhere classified     Problem List Patient Active Problem List   Diagnosis Date Noted   Bilateral primary osteoarthritis of knee 06/11/2019   Cervical radicular pain 06/11/2019   Cervical facet joint syndrome 06/11/2019   Lumbar facet arthropathy 06/11/2019   Lumbar radiculopathy 06/11/2019   Chronic pain syndrome 06/11/2019   Lung nodule 02/09/2016   Exercise hypoxemia 12/28/2015   Chronic obstructive pulmonary disease ([place]) 03/16/2015   Nuclear sclerosis 03/16/2015   OSA (obstructive sleep apnea) 03/16/2015   Primary open angle glaucoma of both eyes, severe stage 03/16/2015   Type 2 diabetes mellitus without complication, without long-term current use of insulin ([place]) 03/16/2015   Arthralgia of left lower leg 10/29/2014   Osteoarthritis of left knee 09/10/2014   Type 2 diabetes mellitus with diabetic neuropathy ([place]) 08/04/2014   Cataract, nuclear sclerotic  senile 04/01/2014   Combined forms of age-related cataract 04/01/2014   Keratitis sicca, bilateral ([place]) 04/01/2014   Primary open angle glaucoma 02/27/2014   Flat feet 01/29/2014   Foot pain, bilateral 01/29/2014   Tinea pedis of both feet 01/29/2014   Ptosis of both eyelids 11/25/2013   Type 2 diabetes mellitus without retinopathy ([place]) 11/25/2013   Onychomycosis 46/07/7996   Chronic systolic heart failure ([place]) 11/11/2013   Emphysema of lung ([place]) 11/11/2013   Essential hypertension 11/11/2013   Screen for colon cancer 11/05/2013   Cardiomyopathy ([place]) 72/15/8727   Systolic HF (heart failure) ([place]) 61/84/8592   Acute systolic CHF (congestive heart failure) ([place]) 09/16/2013   COPD (chronic obstructive pulmonary disease) ([place]) 09/16/2013   HTN (hypertension) 09/16/2013   Obesity 09/16/2013   PAF (paroxysmal atrial fibrillation) ([place]) 09/16/2013   Senile nuclear sclerosis 05/22/2013   Muscle weakness (generalized) 11/11/2009   Long term current use of anticoagulant therapy 06/17/2009   Pulmonary embolism ([place]) 06/17/2009   ED (erectile dysfunction) of organic origin 05/13/2003    Janna Arch, PT, DPT  07/07/2021, 8:44 AM  [place] MAIN [facility] SERVICES [address] [place], [place], [address] Phone: [phone]   Fax:  [phone]  Name: Herve Haug MRN: [id number] Date of Birth: [birth year]

## 2021-07-12 ENCOUNTER — Other Ambulatory Visit: Payer: Self-pay

## 2021-07-12 ENCOUNTER — Ambulatory Visit: Payer: Medicare Other

## 2021-07-12 DIAGNOSIS — M6281 Muscle weakness (generalized): Secondary | ICD-10-CM

## 2021-07-12 DIAGNOSIS — R269 Unspecified abnormalities of gait and mobility: Secondary | ICD-10-CM | POA: Diagnosis not present

## 2021-07-12 DIAGNOSIS — R262 Difficulty in walking, not elsewhere classified: Secondary | ICD-10-CM

## 2021-07-12 NOTE — Therapy (Signed)
[place] ?[place] [facility] ?[place], [place], [address] ?Phone: [phone]   Fax:  [phone] ? ?Physical Therapy Treatment ? ?Patient Details  ?Name: Mario [name] ?MRN: [id number] ?Date of Birth: 01-21-1946 ?Referring Provider (PT): [name], Victoriano Lain, NP ? ? ?Encounter Date: 07/12/2021 ? ? PT End of Session - 07/12/21 0818   ? ? Visit Number 41   ? Number of Visits 50   ? Date for PT Re-Evaluation 07/19/21   ? Authorization Type 1/10 PN 3/9   ? PT Start Time 0800   ? PT Stop Time 0845   ? PT Time Calculation (min) 45 min   ? Equipment Utilized During Treatment Gait belt;Oxygen   ? Activity Tolerance Patient tolerated treatment well   Elevated BP readings  ? ?  ?  ? ?  ? ? ?Past Medical History:  ?Diagnosis Date  ? Arthritis   ? hands, knees  ? Atrial fibrillation ([place])   ? CHF (congestive heart failure) ([place])   ? COPD (chronic obstructive pulmonary disease) ([place])   ? Diabetes mellitus without complication ([place])   ? Diabetic neuropathy ([place])   ? Heart murmur   ? in past, mild 1969  ? Hypertension   ? resolved after weight loss and more activity  ? Neuromuscular disorder ([place])   ? neuropathy  ? Neuropathy   ? Pulmonary embolism ([place])   ? approx 2014  ? Shingles '99  ? Sleep apnea   ? CPAP  ? Wears dentures   ? full upper and lower  ? ? ?Past Surgical History:  ?Procedure Laterality Date  ? CATARACT EXTRACTION W/PHACO Left 05/12/2019  ? Procedure: CATARACT EXTRACTION PHACO AND INTRAOCULAR LENS PLACEMENT ([place]) LEFTDIABETIC ISTENT INJ 2.43  00:28.6;  Surgeon: Eulogio Bear, MD;  Location: [place];  Service: Ophthalmology;  Laterality: Left;  Diabetic - oral meds ?sleep apnea  ? CATARACT EXTRACTION W/PHACO Right 06/02/2019  ? Procedure: CATARACT EXTRACTION PHACO AND INTRAOCULAR LENS PLACEMENT ([facility]) ISTENT INJ  RIGHT DIABETIC 1.31  00:25.4;  Surgeon: Eulogio Bear, MD;  Location: [place];  Service: Ophthalmology;  Laterality:  Right;  ? COLONOSCOPY  08/30/2015  ? FINGER SURGERY Right   ? 3'rd finger, right hand  ? KNEE SURGERY Right   ? screw in knee  ? WRIST SURGERY Left   ? metal removed  ? ? ?There were no vitals filed for this visit. ? ? Subjective Assessment - 07/12/21 0805   ? ? Subjective Patient reports his sciatica is bothering him more today. Was barely able to go down the stairs and walk. Having issues with his feet.   ? Pertinent History Pt is a 76 y.o. male referred to OPPT for LE weakness, bilat join OA, and diabetic peripheral neuropathy. PMH includes: Paroxysmal A-fib, COPD, DM Type 2, CHF, OA of bilat knees, HTN, glaucoma of both eyes. Pt reports losing weight 265 lbs to 210 lbs and this is when he noticed he started having generalized weakness and knee pain. Has received steroid injections which temporarily helped (~1 year ago). Pt having difficulty in bowling for fun, walking to complete community tasks and ADL's (requires electric buggy for shopping). Reports walking ~ 12 meters. Pt does report NO falls besides slipping on lane with bowling. Has had near falls but has used walls to catch himself. Pt reports being on 2 L/min for COPD, denies any balance impairments. Pt's goals for PT are to return to bowling, and complete  community walking tasks without having to take frequent seated rest breaks.   ? Limitations Lifting;Standing;Walking;House hold activities   ? How long can you sit comfortably? unlimited   ? How long can you stand comfortably? 3 minutes   ? How long can you walk comfortably? length of hallway in gym (10-12 m)   ? Patient Stated Goals Return to community ambulation.   ? Currently in Pain? Yes   ? Pain Score 10-Worst pain ever   ? Pain Location Back   ? Pain Orientation Right;Left   ? Pain Descriptors / Indicators Aching   ? Pain Type Acute pain;Chronic pain   ? Pain Radiating Towards bilateral feet   ? Pain Onset In the past 7 days   ? ?  ?  ? ?  ? ? ? ? ? ?Treatment: ?BP at start of session 89/71   ;  Patient on 3L of 02 via nasal cannula.  ?  ?Manual ?Hamstring lengthening BLE 60 seconds ?Single knee to chest BLE 60 seconds ?Piriformis stretch 60 seconds each LE ?  ?Sidelying: ?Roller to piriformis and lumbar paraspinals x5 minutes ?  ?TherEx: ?Sciatic nerve glide 20x with leg on PT shoulder each LE ?Straight leg abduction/adduction 10x each LE; x 2 sets each LE  ?TrA activation pressing green swiss ball between hands and knees 15 x3 seconds ?TrA activation pressing into green swiss ball with UE alternating raises 10x each UE  ?LE rotation 15x  ?  ?3lb ankle weight:  ?-SAQ 10x 3 second holds; x2 sets ?-march 10x each LE : single limb at a time 2 sets  ?  ?Sidelying: ?Clamshell: 15x ?Reverse clamshell 10x ?  ?Pt educated throughout session about proper posture and technique with exercises. Improved exercise technique, movement at target joints, use of target muscles after min to mod verbal, visual, tactile cues. ?   ?Patient is limited in session by sciatica. Patient is aware that he must improve his goals by end of cert in order to continue with therapy. He additionally will have physician appointment near end of month for sciatic pain due to it impacting quality of life. Patient will benefit from skilled physical therapy improve patient's ambulation capacity, mobility, strength, and independence with IADLs/ADLS ? ? ? ? ? ? ? ? ? ? ? ? ? ? ? ? ? PT Education - 07/12/21 0817   ? ? Education Details pain reduction technique,   ? Person(s) Educated Patient   ? Methods Explanation;Demonstration;Verbal cues;Tactile cues   ? Comprehension Verbalized understanding;Returned demonstration;Verbal cues required;Tactile cues required   ? ?  ?  ? ?  ? ? ? PT Short Term Goals - 05/24/21 0809   ? ?  ? PT SHORT TERM GOAL #1  ? Title Pt will be independent with HEP to improve strength, balance, endurance to improve community ADL's.   ? Baseline 8/2: Initiate next session 9/13: intermittent compliance 10/4: intermittent  compliance 11/29: HEP compliance, new HEP given 1/24: HEP compliance   ? Time 4   ? Period Weeks   ? Status Achieved   ? Target Date 04/26/21   ? ?  ?  ? ?  ? ? ? ? PT Long Term Goals - 07/07/21 0816   ? ?  ? PT LONG TERM GOAL #1  ? Title Pt will increase FOTO to target to demonstrate improved ability to complete functional mobility and ADL's.   ? Baseline 8/2: 48/64 9/13: 57% 10/4:  57% 11/29: 51% 1/10: 51% 1/24: 60.8%  3/9: 67%   ? Time 8   ? Period Weeks   ? Status Partially Met   ? Target Date 07/19/21   ?  ? PT LONG TERM GOAL #2  ? Title Pt will improve 5xSTS time to < 10 sec to demonstrate clinically significant improvement in BLE strength.   ? Baseline 8/2: 13.8 sec 9/13: 12.41 seconds 10/4: 12.3 second arms crossed 11/29: 9.7 seconds without UE support 1/24: 9.7 seconds   ? Time 8   ? Period Weeks   ? Status Achieved   ?  ? PT LONG TERM GOAL #3  ? Title Pt will improve gait speed to at least 1.0 m/s to display evidenced norms to safely complete community wlaking tasks with decreased risk of falls.   ? Baseline 8/2: 0.678 m/s 9/13: 0.7 m/s 10/4: 0.87 m/s  11/29: 0.94 m/s with 8/10 knee pain 1/10: 0.75 m/s 1/24: 1.1 m/s   ? Time 8   ? Period Weeks   ? Status Achieved   ? Target Date 05/24/21   ?  ? PT LONG TERM GOAL #4  ? Title Pt will improve 6MWT to at least 1000' to demo ability to complete community distance walking tasks.   ? Baseline 8/2: Deferred due to BP readings. 8/4: 64 ft 9/13: 120 ft in 1 min 20 seconds 10/4: 200 ft with 2 min 58 seconds 11/29: 265 ft in 3 minutes and 3 seconds with 10/10 pain 1/10: deferred due to pain 1/24: 400 ft 3/9: 400 ft   ? Time 8   ? Period Weeks   ? Status Partially Met   ? Target Date 07/19/21   ?  ? PT LONG TERM GOAL #5  ? Title Patient will increase Berg Balance score by > 6 points (42/56 ) to demonstrate decreased fall risk during functional activities.   ? Baseline 10/4: 36/56 11/29: 44/56   ? Time 8   ? Period Weeks   ? Status Achieved   ?  ? PT LONG TERM GOAL #6  ?  Title Patient will increase Berg Balance score by > 6 points (50/56 ) to demonstrate decreased fall risk during functional activities.   ? Baseline 11/29: 44/56 1/10: 43/56 1/24: 46/56 3/9: 38/56   ? Time 8   ? Per

## 2021-07-14 ENCOUNTER — Ambulatory Visit: Payer: Medicare Other

## 2021-07-14 ENCOUNTER — Other Ambulatory Visit: Payer: Self-pay

## 2021-07-14 DIAGNOSIS — R262 Difficulty in walking, not elsewhere classified: Secondary | ICD-10-CM

## 2021-07-14 DIAGNOSIS — M6281 Muscle weakness (generalized): Secondary | ICD-10-CM

## 2021-07-14 DIAGNOSIS — R269 Unspecified abnormalities of gait and mobility: Secondary | ICD-10-CM

## 2021-07-14 NOTE — Therapy (Signed)
[facility] ?[facility] ?[place], [place], [address] ?Phone: [phone]   Fax:  [phone] ? ?Physical Therapy Treatment ? ?Patient Details  ?Name: Mario [name] ?MRN: [id number] ?Date of Birth: [birth year] ?Referring Provider (PT): [name], Victoriano Lain, NP ? ? ?Encounter Date: 07/14/2021 ? ? PT End of Session - 07/14/21 0806   ? ? Visit Number 42   ? Number of Visits 50   ? Date for PT Re-Evaluation 07/19/21   ? Authorization Type 2/10 PN 3/9   ? PT Start Time 0800   ? PT Stop Time 0844   ? PT Time Calculation (min) 44 min   ? Equipment Utilized During Treatment Gait belt;Oxygen   ? Activity Tolerance Patient tolerated treatment well   Elevated BP readings  ? ?  ?  ? ?  ? ? ?Past Medical History:  ?Diagnosis Date  ? Arthritis   ? hands, knees  ? Atrial fibrillation ([place])   ? CHF (congestive heart failure) ([place])   ? COPD (chronic obstructive pulmonary disease) ([place])   ? Diabetes mellitus without complication ([place])   ? Diabetic neuropathy ([place])   ? Heart murmur   ? in past, mild 1969  ? Hypertension   ? resolved after weight loss and more activity  ? Neuromuscular disorder ([place])   ? neuropathy  ? Neuropathy   ? Pulmonary embolism ([place])   ? approx 2014  ? Shingles '99  ? Sleep apnea   ? CPAP  ? Wears dentures   ? full upper and lower  ? ? ?Past Surgical History:  ?Procedure Laterality Date  ? CATARACT EXTRACTION W/PHACO Left 05/12/2019  ? Procedure: CATARACT EXTRACTION PHACO AND INTRAOCULAR LENS PLACEMENT ([place]) LEFTDIABETIC ISTENT INJ 2.43  00:28.6;  Surgeon: Eulogio Bear, MD;  Location: [facility];  Service: Ophthalmology;  Laterality: Left;  Diabetic - oral meds ?sleep apnea  ? CATARACT EXTRACTION W/PHACO Right 06/02/2019  ? Procedure: CATARACT EXTRACTION PHACO AND INTRAOCULAR LENS PLACEMENT ([facility]) ISTENT INJ  RIGHT DIABETIC 1.31  00:25.4;  Surgeon: Eulogio Bear, MD;  Location: [facility];  Service: Ophthalmology;  Laterality:  Right;  ? COLONOSCOPY  08/30/2015  ? FINGER SURGERY Right   ? 3'rd finger, right hand  ? KNEE SURGERY Right   ? screw in knee  ? WRIST SURGERY Left   ? metal removed  ? ? ?There were no vitals filed for this visit. ? ? Subjective Assessment - 07/14/21 0804   ? ? Subjective Patient reports he went down the stairs fast but was able to do it. Is aware next week is his asessment.   ? Pertinent History Pt is a 76 y.o. male referred to OPPT for LE weakness, bilat join OA, and diabetic peripheral neuropathy. PMH includes: Paroxysmal A-fib, COPD, DM Type 2, CHF, OA of bilat knees, HTN, glaucoma of both eyes. Pt reports losing weight 265 lbs to 210 lbs and this is when he noticed he started having generalized weakness and knee pain. Has received steroid injections which temporarily helped (~1 year ago). Pt having difficulty in bowling for fun, walking to complete community tasks and ADL's (requires electric buggy for shopping). Reports walking ~ 12 meters. Pt does report NO falls besides slipping on lane with bowling. Has had near falls but has used walls to catch himself. Pt reports being on 2 L/min for COPD, denies any balance impairments. Pt's goals for PT are to return to bowling, and complete community walking tasks  without having to take frequent seated rest breaks.   ? Limitations Lifting;Standing;Walking;House hold activities   ? How long can you sit comfortably? unlimited   ? How long can you stand comfortably? 3 minutes   ? How long can you walk comfortably? length of hallway in gym (10-12 m)   ? Patient Stated Goals Return to community ambulation.   ? Currently in Pain? Yes   ? Pain Score 7    ? Pain Location Back   ? Pain Orientation Right;Left   ? Pain Descriptors / Indicators Aching   ? Pain Type Acute pain   ? Pain Onset In the past 7 days   ? Pain Frequency Constant   ? ?  ?  ? ?  ? ? ? ? ?  ?BP at start of session: 132/89 ?  ?  ?  ?  ?  ?TherEx ?Nustep Lvl 5, seat #9 RMP> 70 for cardiovascular and  musculoskeletal challenge 5 minutes; alternating resistance for increased cardiovascular challenge.  ?  ?TRX squat with chair behind patient 10x; 2 sets  ?  ?Precor leg press bilateral LE"s #145 requires min A for placement of feet, cues for keeping knees from hyperextension 2 sets stabilization to L knee for pain reduction ; 12x first set, 15x second set ?  ?Precor leg press single LE #70 15x each LE ; stabilization to lateral aspect of L knee for pain reduction ; x2 sets ?  ?Matrix hamstring curl #7.5 15x each LE seated ? ?Seated: ?BTB abduction 20x ?  ?Neuro Re-ed: ?Standing with CGA next to support surface:  ?Airex pad: static stand 30 seconds x 2 trials, noticeable trembling of ankles/LE's with fatigue and challenge to maintain stability ?Airex pad: horizontal head turns 30 seconds scanning room 10x ; cueing for arc of motion  ?Airex pad: vertical head turns 30 seconds, cueing for arc of motion, noticeable sway with upward gaze increasing demand on ankle righting reaction musculature ?Airex pad: one foot on 6" step one foot on airex pad, hold position for 30 seconds, switch legs, 2x each LE; ?  ?  ? Pt educated throughout session about proper posture and technique with exercises. Improved exercise technique, movement at target joints, use of target muscles after min to mod verbal, visual, tactile cues. ?  ?  ?  ? Patient understands he has his assessment next week to determine if he will continue with PT. Patient motivated throughout session with occasional rest breaks. His sciatic pain does continue to be present but is more under control today. Patient will benefit from skilled physical therapy improve patient's ambulation capacity, mobility, strength, and independence with IADLs/ADLS ? ? ? ? ? ? ? ? ? ? ? ? ? ? ? ? ? ? ? ? ? ? ? ? PT Education - 07/14/21 0805   ? ? Education Details exercise technique, body mechanics   ? Person(s) Educated Patient   ? Methods Explanation;Demonstration;Tactile cues;Verbal  cues   ? Comprehension Verbalized understanding;Returned demonstration;Verbal cues required;Tactile cues required   ? ?  ?  ? ?  ? ? ? PT Short Term Goals - 05/24/21 0809   ? ?  ? PT SHORT TERM GOAL #1  ? Title Pt will be independent with HEP to improve strength, balance, endurance to improve community ADL's.   ? Baseline 8/2: Initiate next session 9/13: intermittent compliance 10/4: intermittent compliance 11/29: HEP compliance, new HEP given 1/24: HEP compliance   ? Time 4   ? Period  Weeks   ? Status Achieved   ? Target Date 04/26/21   ? ?  ?  ? ?  ? ? ? ? PT Long Term Goals - 07/07/21 0816   ? ?  ? PT LONG TERM GOAL #1  ? Title Pt will increase FOTO to target to demonstrate improved ability to complete functional mobility and ADL's.   ? Baseline 8/2: 48/64 9/13: 57% 10/4:  57% 11/29: 51% 1/10: 51% 1/24: 60.8% 3/9: 67%   ? Time 8   ? Period Weeks   ? Status Partially Met   ? Target Date 07/19/21   ?  ? PT LONG TERM GOAL #2  ? Title Pt will improve 5xSTS time to < 10 sec to demonstrate clinically significant improvement in BLE strength.   ? Baseline 8/2: 13.8 sec 9/13: 12.41 seconds 10/4: 12.3 second arms crossed 11/29: 9.7 seconds without UE support 1/24: 9.7 seconds   ? Time 8   ? Period Weeks   ? Status Achieved   ?  ? PT LONG TERM GOAL #3  ? Title Pt will improve gait speed to at least 1.0 m/s to display evidenced norms to safely complete community wlaking tasks with decreased risk of falls.   ? Baseline 8/2: 0.678 m/s 9/13: 0.7 m/s 10/4: 0.87 m/s  11/29: 0.94 m/s with 8/10 knee pain 1/10: 0.75 m/s 1/24: 1.1 m/s   ? Time 8   ? Period Weeks   ? Status Achieved   ? Target Date 05/24/21   ?  ? PT LONG TERM GOAL #4  ? Title Pt will improve 6MWT to at least 1000' to demo ability to complete community distance walking tasks.   ? Baseline 8/2: Deferred due to BP readings. 8/4: 64 ft 9/13: 120 ft in 1 min 20 seconds 10/4: 200 ft with 2 min 58 seconds 11/29: 265 ft in 3 minutes and 3 seconds with 10/10 pain 1/10:  deferred due to pain 1/24: 400 ft 3/9: 400 ft   ? Time 8   ? Period Weeks   ? Status Partially Met   ? Target Date 07/19/21   ?  ? PT LONG TERM GOAL #5  ? Title Patient will increase Berg Balance score by > 6 points (42/56 )

## 2021-07-17 ENCOUNTER — Ambulatory Visit: Payer: Self-pay

## 2021-07-19 ENCOUNTER — Ambulatory Visit: Payer: Medicare Other

## 2021-07-19 NOTE — Therapy (Signed)
[facility] ?[facility] ?[address] ?[place], [place], [address] ?Phone: [phone]   Fax:  [phone] ? ?July 19, 2021  ? ?No Recipients ? ?Physical Therapy Discharge Summary ? ?Patient: Mario [name]  ?MRN: [id number]  ?Date of Birth: 1945/08/31  ? ?Diagnosis: No diagnosis found. ?Referring Provider (PT): [name], [name], NP ? ? ?The above patient had been seen in Physical Therapy 41 times of 60 treatments scheduled with 1 no shows and 12 cancellations. ? ?The treatment consisted of manual, therex, neuro re-ed ?The patient is:  unknown due to patient calling to self discharge ? ?Subjective: Patient calling to self discharge due to onset of sciatica causing limited mobility.  ? ?Discharge Findings: Unknown due to patient not present ? ?Functional Status at Discharge: Unknown due to patient not present ? ?  ? ? ? ?Sincerely, ? ? ?Precious Bard, PT, DPT ? ? ? ?CC ?No Recipients ? ?[facility] ?[facility] ?[address] ?[place], [place], [address] ?Phone: [phone]   Fax:  [phone] ? ?Patient: Mario [name]  ?MRN: [id number]  ?Date of Birth: 01/03/1946  ? ? ? ?

## 2021-07-21 ENCOUNTER — Ambulatory Visit: Payer: Medicare Other

## 2021-07-26 ENCOUNTER — Ambulatory Visit: Payer: Medicare Other

## 2021-07-28 ENCOUNTER — Ambulatory Visit: Payer: Medicare Other

## 2021-08-02 ENCOUNTER — Ambulatory Visit: Payer: Medicare Other

## 2021-08-03 ENCOUNTER — Ambulatory Visit
Admit: 2021-08-03 | Discharge: 2021-08-04 | Payer: MEDICARE | Attending: Student in an Organized Health Care Education/Training Program | Primary: Student in an Organized Health Care Education/Training Program

## 2021-08-03 ENCOUNTER — Other Ambulatory Visit: Payer: Self-pay

## 2021-08-03 ENCOUNTER — Ambulatory Visit
Admission: EM | Admit: 2021-08-03 | Discharge: 2021-08-03 | Disposition: A | Discharge status: Home / Self Care | Payer: Medicare Other | Attending: Physician Assistant | Admitting: Physician Assistant

## 2021-08-03 ENCOUNTER — Encounter: Payer: Self-pay | Admitting: Emergency Medicine

## 2021-08-03 DIAGNOSIS — E1169 Type 2 diabetes mellitus with other specified complication: Principal | ICD-10-CM

## 2021-08-03 DIAGNOSIS — R21 Rash and other nonspecific skin eruption: Principal | ICD-10-CM

## 2021-08-03 DIAGNOSIS — M5416 Radiculopathy, lumbar region: Secondary | ICD-10-CM

## 2021-08-03 MED ORDER — TRAMADOL HCL 50 MG PO TABS: 50.0000 mg | ORAL_TABLET | Freq: Three times a day (TID) | ORAL | 0 refills | Status: AC | PRN

## 2021-08-03 NOTE — ED Provider Notes (Signed)
?[facility] ? ? ? ?CSN: [id number] ?Arrival date & time: 08/03/21  1017 ? ? ?  ? ?History   ?Chief Complaint ?Chief Complaint  ?Patient presents with  ? Sciatica  ? ? ?HPI ?Mario [name] is a 76 y.o. male presenting for approximately 3-week history of bilateral buttocks pain that radiates all the way down to his feet when he stands.  He denies associated back pain.  He says he does have some numbness and tingling but has a history of peripheral neuropathy.  Patient reports he has leg weakness but he has had the leg weakness for many months.  He has gone to physical therapy for that.  Denies it being any worse since he started to have this new pain.  Patient says he does not really have any pain when he is sitting or laying down and its mostly when he stands and tries to walk.  Reports he can only walk short distances before he has to sit down.  Patient denies any associated loss of bowel or bladder control.  Patient did have a fall before the onset of pain but says that he fell forward and did not fall onto his back. ? ?Patient has history of bilateral knee joint osteoarthritis, diabetic peripheral neuropathy, paroxysmal atrial fibrillation, COPD, type 2 diabetes, CHF, hypertension, obesity and glaucoma.  Patient has been going to physical therapy for the last 8 months.  Patient is taking 800 mg gabapentin 3 times a day and 10 mg Celebrex daily for pain and neuropathy.  Patient has stopped going to physical therapy since insurance stopped covering it.  He last went about a month ago.  He last saw his PCP in February of this year. ? ?HPI ? ?Past Medical History:  ?Diagnosis Date  ? Arthritis   ? hands, knees  ? Atrial fibrillation ([place])   ? CHF (congestive heart failure) ([place])   ? COPD (chronic obstructive pulmonary disease) ([place])   ? Diabetes mellitus without complication ([place])   ? Diabetic neuropathy ([place])   ? Heart murmur   ? in past, mild 1969  ? Hypertension   ? resolved after weight loss and more  activity  ? Neuromuscular disorder ([place])   ? neuropathy  ? Neuropathy   ? Pulmonary embolism ([place])   ? approx 2014  ? Shingles '99  ? Sleep apnea   ? CPAP  ? Wears dentures   ? full upper and lower  ? ? ?Patient Active Problem List  ? Diagnosis Date Noted  ? Bilateral primary osteoarthritis of knee 06/11/2019  ? Cervical radicular pain 06/11/2019  ? Cervical facet joint syndrome 06/11/2019  ? Lumbar facet arthropathy 06/11/2019  ? Lumbar radiculopathy 06/11/2019  ? Chronic pain syndrome 06/11/2019  ? Lung nodule 02/09/2016  ? Exercise hypoxemia 12/28/2015  ? Chronic obstructive pulmonary disease ([place]) 03/16/2015  ? Nuclear sclerosis 03/16/2015  ? OSA (obstructive sleep apnea) 03/16/2015  ? Primary open angle glaucoma of both eyes, severe stage 03/16/2015  ? Type 2 diabetes mellitus without complication, without long-term current use of insulin ([place]) 03/16/2015  ? Arthralgia of left lower leg 10/29/2014  ? Osteoarthritis of left knee 09/10/2014  ? Type 2 diabetes mellitus with diabetic neuropathy ([place]) 08/04/2014  ? Cataract, nuclear sclerotic senile 04/01/2014  ? Combined forms of age-related cataract 04/01/2014  ? Keratitis sicca, bilateral ([place]) 04/01/2014  ? Primary open angle glaucoma 02/27/2014  ? Flat feet 01/29/2014  ? Foot pain, bilateral 01/29/2014  ? Tinea pedis of both feet  01/29/2014  ? Ptosis of both eyelids 11/25/2013  ? Type 2 diabetes mellitus without retinopathy ([place]) 11/25/2013  ? Onychomycosis 11/19/2013  ? Chronic systolic heart failure ([place]) 11/11/2013  ? Emphysema of lung ([place]) 11/11/2013  ? Essential hypertension 11/11/2013  ? Screen for colon cancer 11/05/2013  ? Cardiomyopathy ([place]) 10/08/2013  ? Systolic HF (heart failure) ([place]) 10/08/2013  ? Acute systolic CHF (congestive heart failure) ([place]) 09/16/2013  ? COPD (chronic obstructive pulmonary disease) ([place]) 09/16/2013  ? HTN (hypertension) 09/16/2013  ? Obesity 09/16/2013  ? PAF (paroxysmal atrial fibrillation) ([place]) 09/16/2013  ? Senile  nuclear sclerosis 05/22/2013  ? Muscle weakness (generalized) 11/11/2009  ? Long term current use of anticoagulant therapy 06/17/2009  ? Pulmonary embolism ([place]) 06/17/2009  ? ED (erectile dysfunction) of organic origin 05/13/2003  ? ? ?Past Surgical History:  ?Procedure Laterality Date  ? CATARACT EXTRACTION W/PHACO Left 05/12/2019  ? Procedure: CATARACT EXTRACTION PHACO AND INTRAOCULAR LENS PLACEMENT ([place]) LEFTDIABETIC ISTENT INJ 2.43  00:28.6;  Surgeon: Eulogio Bear, MD;  Location: [facility];  Service: Ophthalmology;  Laterality: Left;  Diabetic - oral meds ?sleep apnea  ? CATARACT EXTRACTION W/PHACO Right 06/02/2019  ? Procedure: CATARACT EXTRACTION PHACO AND INTRAOCULAR LENS PLACEMENT ([facility]) ISTENT INJ  RIGHT DIABETIC 1.31  00:25.4;  Surgeon: Eulogio Bear, MD;  Location: [facility];  Service: Ophthalmology;  Laterality: Right;  ? COLONOSCOPY  08/30/2015  ? FINGER SURGERY Right   ? 3'rd finger, right hand  ? KNEE SURGERY Right   ? screw in knee  ? WRIST SURGERY Left   ? metal removed  ? ? ? ? ? ?Home Medications   ? ?Prior to Admission medications   ?Medication Sig Start Date End Date Taking? Authorizing Provider  ?albuterol (PROVENTIL HFA;VENTOLIN HFA) 108 (90 Base) MCG/ACT inhaler Inhale 2 puffs into the lungs as needed. 11/23/15  Yes [provider]  ?APPLE CIDER VINEGAR PO Take by mouth daily.   Yes [provider]  ?celecoxib (CELEBREX) 200 MG capsule  11/17/18  Yes [provider]  ?DULoxetine (CYMBALTA) 60 MG capsule am 11/10/18  Yes [provider]  ?Fluticasone-Umeclidin-Vilant (TRELEGY ELLIPTA IN) Inhale into the lungs daily.   Yes [provider]  ?furosemide (LASIX) 80 MG tablet Take 1 tablet by mouth 2 (two) times daily. 80 mg AM, 120 mg PM 08/23/15  Yes [provider]  ?gabapentin (NEURONTIN) 300 MG capsule Take 800 mg by mouth 3 (three) times daily.  07/17/15  Yes [provider]  ?hydrochlorothiazide  (HYDRODIURIL) 12.5 MG tablet Take 1 tablet (12.5 mg total) by mouth daily. 06/07/21 08/03/21 Yes Lucrezia Starch, MD  ?ipratropium-albuterol (DUONEB) 0.5-2.5 (3) MG/3ML SOLN Inhale into the lungs. 12/28/15  Yes [provider]  ?metFORMIN (GLUCOPHAGE) 1000 MG tablet Take 1 tablet by mouth 2 (two) times daily. 08/12/15  Yes [provider]  ?metFORMIN (GLUCOPHAGE) 500 MG tablet  06/08/19  Yes [provider]  ?OZEMPIC, 1 MG/DOSE, 2 MG/1.5ML SOPN  11/20/18  Yes [provider]  ?traMADol (ULTRAM) 50 MG tablet Take 1 tablet (50 mg total) by mouth every 8 (eight) hours as needed for up to 5 days. 08/03/21 08/08/21 Yes Danton Clap, PA-C  ?edoxaban (SAVAYSA) 60 MG TABS tablet Take 60 mg by mouth daily. cardiology 07/02/15   [provider]  ?gabapentin (NEURONTIN) 800 MG tablet Take 800 mg by mouth 3 (three) times daily. 06/08/19   [provider]  ?glucose blood (ONE TOUCH ULTRA TEST) test  strip Use as instructed FOR TESTING three times daily.  E11.9 06/01/15   [provider]  ?ROCKLATAN 0.02-0.005 % SOLN Apply 1 drop to eye at bedtime. 06/20/19   [provider]  ?sotalol (BETAPACE) 80 MG tablet Take 1 tablet by mouth 2 (two) times daily. 08/25/14   [provider]  ?SOTALOL AF 80 MG TABS Take by mouth 2 (two) times daily. Breakfast and dinner    [provider]  ?TURMERIC PO Take by mouth daily.    [provider]  ?losartan (COZAAR) 50 MG tablet Take 1 tablet by mouth daily. 07/05/15 11/29/18  [provider]  ? ? ?Family History ?No family history on file. ? ?Social History ?Social History  ? ?Tobacco Use  ? Smoking status: Former  ?  Packs/day: 0.50  ?  Years: 1.00  ?  Pack years: 0.50  ?  Types: Cigarettes  ?  Quit date: 05/01/1969  ?  Years since quitting: 52.2  ? Smokeless tobacco: Never  ? Tobacco comments:  ?  Quit 1971  ?Vaping Use  ? Vaping Use: Never used  ?Substance Use Topics  ? Alcohol use: No  ? Drug use: No   ? ? ? ?Allergies   ?Rosuvastatin, Ace inhibitors, Lisinopril, and Simbrinza [brinzolamide-brimonidine] ? ? ?Review of Systems ?Review of Systems  ?Constitutional:  Negative for fatigue.  ?Musculoskeletal:  Positive for arthr

## 2021-08-03 NOTE — Discharge Instructions (Addendum)
PAIN: As we discussed, your pain is related to your chronic back condition and you need to follow-up with orthopedics to have another MRI.  Stressed avoiding painful activities . RICE (REST, ICE, COMPRESSION, ELEVATION) guidelines reviewed. May alternate ice and heat. Consider use of muscle rubs, Salonpas patches, etc. Use medications as directed including home gabapentin, Celebrex and Tylenol.  Have also sent Ultram if absolutely needed for pain.  F/u with PCP in 7-10 days for reexamination, and please feel free to call or return to the urgent care at any time for any questions or concerns you may have and we will be happy to help you!  ? ?RED FLAGS: If the pain acutely worsens or there are any red flag symptoms such as numbness/tingling, increased leg weakness, saddle anesthesia, or loss of bowel/bladder control, go immediately to the ER. Follow up with [place] as scheduled or sooner if the pain does not begin to resolve or if it worsens before the follow up   ? ?You have a condition requiring you to follow up with Orthopedics so please call one of the following office for appointment:  ? ?[facility] ?[address], [place], [place] [address] ?Phone: [phone] ? ?[facility] ?[address] [place], [place] [address] ?Phone: [phone]  ?

## 2021-08-03 NOTE — ED Triage Notes (Signed)
Pt c/o pain in bilateral buttock and radiates down both legs. Started about 3 weeks ago. He denies back pain. He states he does not have pain unless he tried to stand up or walk.  ?

## 2021-08-04 ENCOUNTER — Ambulatory Visit: Payer: Medicare Other

## 2021-08-09 ENCOUNTER — Other Ambulatory Visit: Payer: Self-pay | Admitting: Orthopedic Surgery

## 2021-08-09 ENCOUNTER — Ambulatory Visit: Payer: Medicare Other

## 2021-08-09 DIAGNOSIS — G8929 Other chronic pain: Secondary | ICD-10-CM

## 2021-08-09 DIAGNOSIS — M4807 Spinal stenosis, lumbosacral region: Secondary | ICD-10-CM

## 2021-08-11 ENCOUNTER — Ambulatory Visit: Payer: Medicare Other

## 2021-08-16 ENCOUNTER — Ambulatory Visit: Payer: Medicare Other

## 2021-08-23 ENCOUNTER — Ambulatory Visit: Payer: Medicare Other

## 2021-08-25 ENCOUNTER — Other Ambulatory Visit: Payer: Self-pay

## 2021-08-25 ENCOUNTER — Ambulatory Visit: Payer: Medicare Other

## 2021-08-25 ENCOUNTER — Emergency Department
Admission: EM | Admit: 2021-08-25 | Discharge: 2021-08-25 | Disposition: A | Discharge status: Home / Self Care | Payer: Medicare Other | Attending: Emergency Medicine | Admitting: Emergency Medicine

## 2021-08-25 ENCOUNTER — Encounter: Payer: Self-pay | Admitting: Emergency Medicine

## 2021-08-25 DIAGNOSIS — I10 Essential (primary) hypertension: Secondary | ICD-10-CM | POA: Insufficient documentation

## 2021-08-25 DIAGNOSIS — J449 Chronic obstructive pulmonary disease, unspecified: Secondary | ICD-10-CM | POA: Diagnosis not present

## 2021-08-25 DIAGNOSIS — I509 Heart failure, unspecified: Secondary | ICD-10-CM | POA: Diagnosis not present

## 2021-08-25 DIAGNOSIS — M4800 Spinal stenosis, site unspecified: Secondary | ICD-10-CM | POA: Insufficient documentation

## 2021-08-25 DIAGNOSIS — E119 Type 2 diabetes mellitus without complications: Secondary | ICD-10-CM | POA: Diagnosis not present

## 2021-08-25 DIAGNOSIS — M549 Dorsalgia, unspecified: Secondary | ICD-10-CM | POA: Diagnosis present

## 2021-08-25 DIAGNOSIS — I4891 Unspecified atrial fibrillation: Secondary | ICD-10-CM | POA: Diagnosis not present

## 2021-08-25 DIAGNOSIS — I110 Hypertensive heart disease with heart failure: Secondary | ICD-10-CM | POA: Diagnosis not present

## 2021-08-25 MED ORDER — CYCLOBENZAPRINE HCL 10 MG PO TABS: 10.0000 mg | ORAL_TABLET | Freq: Three times a day (TID) | ORAL | 0 refills | Status: AC | PRN

## 2021-08-25 MED ORDER — LIDOCAINE 5 % EX PTCH: 1.0000 | MEDICATED_PATCH | Freq: Two times a day (BID) | CUTANEOUS | 0 refills | Status: AC

## 2021-08-25 MED ORDER — LIDOCAINE 5 % EX PTCH: 1.0000 | MEDICATED_PATCH | CUTANEOUS | Status: DC

## 2021-08-25 NOTE — Discharge Instructions (Addendum)
-  You may take up to 10 mg of your oxycodone every 4 hours as needed. ?-You may take up to 1000 mg of acetaminophen every 6 hours as needed. ?-You may take cyclobenzaprine up to 3 times daily as a muscle relaxer, however use caution when taking this medication as it may make you sleepy/drowsy. ?-Utilize lidocaine patches as needed ?-Follow through with your MRI scheduled this Saturday and follow-up with your orthopedist as needed. ?-Return to the emergency department anytime if you begin to experience any new or worsening symptoms ?

## 2021-08-25 NOTE — ED Notes (Signed)
See triage note  presents with lower back pain/leg pain  has seen his PCP and is having treatment  states pain has gotten worse with ambulation   ?

## 2021-08-25 NOTE — ED Provider Notes (Signed)
? ?[facility] ?Provider Note ? ? ? Event Date/Time  ? First MD Initiated Contact with Patient 08/25/21 0914   ?  (approximate) ? ? ?History  ? ?Chief Complaint ?Back Pain ? ? ?HPI ?Mario [name] is a 76 y.o. male, history of systolic CHF, hypertension, emphysema, paroxysmal atrial fibrillation, diabetes, COPD, pulmonary embolism, spinal stenosis, presents to the emergency department for evaluation of back pain.  Patient states that he was recently diagnosed with spinal stenosis after having back pain for the past 6 weeks with numbness/tingling in his lower extremities.  Reports most of his pain when standing upright and says he has to walk while slouching over.  He has an MRI scheduled in 2 days.  He states that he has been taking oxycodone, gabapentin, and just finished a week long course of prednisone, however his pain has still persisted.  He states that he is hoping for an MRI today, as well as a shot of something.  Denies fever/chills, chest pain, shortness of breath, abdominal pain, flank pain, nausea/vomiting, saddle anesthesia, bowel/bladder dysfunction, rashes/lesions, or headache. ? ?History Limitations: No limitations. ? ?    ? ? ?Physical Exam  ?Triage Vital Signs: ?ED Triage Vitals  ?Enc Vitals Group  ?   BP 08/25/21 0917 105/77  ?   Pulse Rate 08/25/21 0917 78  ?   Resp 08/25/21 0917 16  ?   Temp 08/25/21 0917 97.8 ?F (36.6 ?C)  ?   Temp Source 08/25/21 0917 Oral  ?   SpO2 08/25/21 0917 100 %  ?   Weight 08/25/21 0906 219 lb 12.8 oz (99.7 kg)  ?   Height 08/25/21 0906 5\' 10"  (1.778 m)  ?   Head Circumference --   ?   Peak Flow --   ?   Pain Score 08/25/21 0906 0  ?   Pain Loc --   ?   Pain Edu? --   ?   Excl. in [place]? --   ? ? ?Most recent vital signs: ?Vitals:  ? 08/25/21 0917  ?BP: 105/77  ?Pulse: 78  ?Resp: 16  ?Temp: 97.8 ?F (36.6 ?C)  ?SpO2: 100%  ? ? ?General: Awake, NAD.  ?Skin: Warm, dry. No rashes or lesions.  ?Eyes: PERRL. Conjunctivae normal.  ?CV: Good peripheral  perfusion.  ?Resp: Normal effort.  ?Abd: Soft, non-tender. No distention.  ?Neuro: At baseline. No gross neurological deficits.  ? ?Focused Exam: No gross deformities along the lumbar spine, no remarkable tenderness.  No surrounding warmth or erythema.  Negative straight leg test bilaterally.  Patient still able to ambulate, though does seem to slouch over anteriorly for comfort.  Pulse, motor, sensation intact distally.  Patient maintains normal range of motion in the right lower extremities. ? ?Physical Exam ? ? ? ?ED Results / Procedures / Treatments  ?Labs ?(all labs ordered are listed, but only abnormal results are displayed) ?Labs Reviewed - No data to display ? ? ?EKG ?Not applicable. ? ? ?RADIOLOGY ? ?ED Provider Interpretation: Not applicable. ? ?No results found. ? ?PROCEDURES: ? ?Critical Care performed: None ? ?Procedures ? ? ? ?MEDICATIONS ORDERED IN ED: ?Medications - No data to display ? ? ?IMPRESSION / MDM / ASSESSMENT AND PLAN / ED COURSE  ?I reviewed the triage vital signs and the nursing notes. ?             ?               ? ?Differential diagnosis includes, but  is not limited to, lumbosacral strain, lumbar radiculopathy/sciatica, spinal stenosis. ? ?ED Course ?Patient appears well, vitals within normal limits.  NAD.  Currently afebrile. ? ?Assessment/Plan ?Presentation consistent with spinal stenosis, as previously diagnosed by orthopedics at [facility].  Physical exam and history do not suggest any serious or life-threatening pathology.  No endorsement of saddle anesthesia or loss of bowel/bladder dysfunction to indicate cauda equina syndrome.  No indications for emergent MRI imaging.  He is currently already taking oxycodone and gabapentin.  He states that he was told by his regular doctors that he cannot take NSAIDs.  Has not to provide steroid injection, as he just recently finished week-long prednisone.  Options for additional pain management include adding Tylenol, cyclobenzaprine,  and lidocaine patches, as he has not attempted these modalities yet.  He is also only taking 5 mg oxycodone.  We will provide him with these prescriptions. Advised him that he can take up to 10 mg oxycodone every 4-6 hours as needed.   Advised him to follow through with his MRI study on Saturday and follow-up with orthopedics as needed. ? ?Provided the patient with anticipatory guidance, return precautions, and educational material. Encouraged the patient to return to the emergency department at any time if they begin to experience any new or worsening symptoms. Patient expressed understanding and agreed with the plan.  ? ?  ? ? ?FINAL CLINICAL IMPRESSION(S) / ED DIAGNOSES  ? ?Final diagnoses:  ?Spinal stenosis, unspecified spinal region  ? ? ? ?Rx / DC Orders  ? ?ED Discharge Orders   ? ?      Ordered  ?  cyclobenzaprine (FLEXERIL) 10 MG tablet  3 times daily PRN       ? 08/25/21 0947  ?  lidocaine (LIDODERM) 5 %  Every 12 hours       ? 08/25/21 0951  ? ?  ?  ? ?  ? ? ? ?Note:  This document was prepared using Dragon voice recognition software and may include unintentional dictation errors. ?  ?Teodoro Spray, [place] ?08/25/21 1021 ? ?  ?Blake Divine, MD ?08/25/21 1427 ? ?

## 2021-08-25 NOTE — ED Triage Notes (Signed)
Pt comes into the ED via POV c/o low back pain that radiates into the legs.  Pt states this has been an ongoing problem for weeks at this time.  Pt is scheduled for an MRI next week, but states the pain has gotten too bad.  PT has known spinal stenosis.  Pt in NAD at this time with even and unlabored respirations.  PT denies pain while sitting, but states when he has to move it is intense pain.  ?

## 2021-08-27 ENCOUNTER — Ambulatory Visit
Admission: RE | Admit: 2021-08-27 | Discharge: 2021-08-27 | Disposition: A | Discharge status: Home / Self Care | Payer: Medicare Other | Source: Ambulatory Visit | Attending: Orthopedic Surgery | Admitting: Orthopedic Surgery

## 2021-08-27 DIAGNOSIS — M5441 Lumbago with sciatica, right side: Secondary | ICD-10-CM | POA: Diagnosis present

## 2021-08-27 DIAGNOSIS — M4807 Spinal stenosis, lumbosacral region: Secondary | ICD-10-CM | POA: Diagnosis present

## 2021-08-27 DIAGNOSIS — G8929 Other chronic pain: Secondary | ICD-10-CM | POA: Insufficient documentation

## 2021-08-30 ENCOUNTER — Ambulatory Visit: Payer: Medicare Other

## 2021-09-01 ENCOUNTER — Ambulatory Visit: Payer: Medicare Other

## 2021-09-06 ENCOUNTER — Ambulatory Visit: Payer: Medicare Other

## 2021-09-08 ENCOUNTER — Ambulatory Visit: Payer: Medicare Other

## 2021-09-13 ENCOUNTER — Ambulatory Visit: Payer: Medicare Other

## 2021-09-14 ENCOUNTER — Ambulatory Visit: Admit: 2021-09-14 | Discharge: 2021-09-14 | Payer: MEDICARE | Attending: "Endocrinology | Primary: "Endocrinology

## 2021-09-14 ENCOUNTER — Ambulatory Visit: Admit: 2021-09-14 | Discharge: 2021-09-14 | Payer: MEDICARE

## 2021-09-14 ENCOUNTER — Ambulatory Visit
Admit: 2021-09-14 | Discharge: 2021-09-14 | Payer: MEDICARE | Attending: Student in an Organized Health Care Education/Training Program | Primary: Student in an Organized Health Care Education/Training Program

## 2021-09-14 DIAGNOSIS — E785 Hyperlipidemia, unspecified: Principal | ICD-10-CM

## 2021-09-14 DIAGNOSIS — L400 Psoriasis vulgaris: Principal | ICD-10-CM

## 2021-09-14 DIAGNOSIS — I5022 Chronic systolic (congestive) heart failure: Principal | ICD-10-CM

## 2021-09-14 DIAGNOSIS — Z1159 Encounter for screening for other viral diseases: Principal | ICD-10-CM

## 2021-09-14 DIAGNOSIS — E1142 Type 2 diabetes mellitus with diabetic polyneuropathy: Principal | ICD-10-CM

## 2021-09-14 DIAGNOSIS — Z79899 Other long term (current) drug therapy: Principal | ICD-10-CM

## 2021-09-14 DIAGNOSIS — Z0000 Encounter for general adult medical examination without abnormal findings: Principal | ICD-10-CM

## 2021-09-14 DIAGNOSIS — E1169 Type 2 diabetes mellitus with other specified complication: Principal | ICD-10-CM

## 2021-09-14 MED ORDER — SECUKINUMAB 150 MG/ML SUBCUTANEOUS PEN INJECTOR
SUBCUTANEOUS | 0 refills | 35 days | Status: CP
Start: 2021-09-14 — End: ?

## 2021-09-14 MED ORDER — CLOBETASOL 0.05 % TOPICAL OINTMENT
Freq: Two times a day (BID) | TOPICAL | 1 refills | 0 days | Status: CP
Start: 2021-09-14 — End: 2022-09-14

## 2021-09-14 MED ORDER — COSENTYX PEN 300 MG/2 PENS (150 MG/ML) SUBCUTANEOUS
SUBCUTANEOUS | 3 refills | 28 days | Status: CP
Start: 2021-09-14 — End: ?

## 2021-09-14 NOTE — Unmapped (Signed)
Endocrinology consultation     Paul Velez is a 76 y.o. male seen in Endocrinology consultation on 09/14/21 for evaluation of Consult and Diabetes      Referring Provider: Claudean Velez     Primary Provider: Maree Erie, MD      Assessment/Plan:     Type 2 diabetes mellitus with other specified complication, without long-term current use of insulin (CMS-HCC)  -     POCT glycosylated hemoglobin (Hb A1C)  -     POCT glucose  -     Ambulatory referral to Endocrinology    Diabetic peripheral neuropathy (CMS-HCC)  -     POCT glycosylated hemoglobin (Hb A1C)  -     POCT glucose    Hyperlipidemia associated with type 2 diabetes mellitus (CMS-HCC)  -     POCT glycosylated hemoglobin (Hb A1C)  -     POCT glucose    CHF (congestive heart failure), NYHA class II, chronic, systolic (CMS-HCC)  Overview:  Overview:   EF 62% w/ nml RV function      Routine health maintenance  -     Albumin/creatinine urine ratio        Return in about 3 months (around 12/15/2021) for diabetes, Office Visit, Diabetes, w/ PCP, office visit.    There are no Patient Instructions on file for this visit.      Subjective:     History of Present Illness:     Referred in consultation for diabetes management by Paul Velez his new PCP.  He was previously f/b Paul Velez at [facility] last 03/2021. Pt states he was fired and described a disagreement regarding assessments of his feet which is pt's primary concern.    Paul Velez is a 75y.o. male with diabetes, hypertension, hyperlipidemia, lung disease, oxygen dependent and cmp. He was originally diagnosed with diabetes around 2008.     He currently takes metformin 1000 mg twice a day. He also takes Ozempic 1 mg once a week. Jardiance 10mg      He tests his fasting sugar daily. His meter was downloaded and reviewed. The average was 120  with a low of 99 high 157 Sugar is 133 this afternoon at visit post lunch.His numbers look good overall. He watches his diet. He exercises 3-4 times a week in an exercising class.     He has neuropathy. His feet are feeling better on both gabapentin and cymbalta.     He is concerned about his diabetes.  His A1c today is 6.5    ROS: No chest pain. No shortness of breath.        Past Medical History:   Diagnosis Date    Allergic     CHF (congestive heart failure) (CMS-HCC)     COPD (chronic obstructive pulmonary disease) (CMS-HCC)     Diabetes type 2, controlled (CMS-HCC)     HLD (hyperlipidemia)     HTN (hypertension)     Neuropathy     OA (osteoarthritis) of knee     PAF (paroxysmal atrial fibrillation) (CMS-HCC)     Sarcoidosis         No past surgical history on file.    Current Outpatient Medications   Medication Sig Dispense Refill    atorvastatin (LIPITOR) 20 MG tablet       empagliflozin (JARDIANCE) 10 mg tablet Take 1 tablet (10 mg total) by mouth daily. 90 tablet 3    metFORMIN (GLUCOPHAGE) 1000 MG tablet Take 1 tablet (1,000 mg total) by  mouth in the morning and 1 tablet (1,000 mg total) in the evening. Take with meals.      semaglutide (OZEMPIC) 1 mg/dose (4 mg/3 mL) PnIj injection Inject under the skin every seven (7) days.      albuterol sulfate 90 mcg/actuation AePB Inhale.      amLODIPine (NORVASC) 5 MG tablet Take 1 tablet (5 mg total) by mouth daily.      celecoxib (CELEBREX) 200 MG capsule       clobetasoL (TEMOVATE) 0.05 % ointment Apply topically Two (2) times a day. Use on psoriasis until smooth. 60 g 1    DULoxetine (CYMBALTA) 60 MG capsule       edoxaban (SAVAYSA) 60 mg tablet Take 1 tablet (60 mg total) by mouth daily.      furosemide (LASIX) 80 MG tablet Take 1 tablet (80 mg total) by mouth daily.      gabapentin (NEURONTIN) 800 MG tablet Take 1 tablet (800 mg total) by mouth Two (2) times a day.      hydroCHLOROthiazide (HYDRODIURIL) 12.5 MG tablet       losartan (COZAAR) 50 MG tablet Take 1 tablet (50 mg total) by mouth daily.      ONETOUCH VERIO TEST STRIPS Strp TEST TWICE DAILY      secukinumab (COSENTYX PEN, 2 PENS,) 150 mg/mL PnIj injection Inject the contents of 2 pens (300 mg total) under the skin every twenty-eight (28) days. 2 mL 3    secukinumab (COSENTYX) 150 mg/mL PnIj injection Inject the contents of 2 pens (300 mg total) under the skin once a week for 5 doses, week 0, 1, 2, 3, 4 10 mL 0    sotalol (BETAPACE) 80 MG tablet Take 1 tablet (80 mg total) by mouth Two (2) times a day.      TRELEGY ELLIPTA 100-62.5-25 mcg inhaler       umeclidinium (INCRUSE ELLIPTA) 62.5 mcg/actuation inhaler Inhale 1 puff daily.       No current facility-administered medications for this visit.        Allergies   Allergen Reactions    Rosuvastatin Anaphylaxis     felt bad    Ace Inhibitors Anxiety    Lisinopril Other (See Comments)     Other reaction(s): Mental Status Change, Cough    Brinzolamide-Brimonidine Itching     And burning  And burning         Family History   Problem Relation Age of Onset    Melanoma Neg Hx     Basal cell carcinoma Neg Hx     Squamous cell carcinoma Neg Hx        Review of Systems -  negative except as noted in HPI.     Objective:     Physical Exam:  BP 134/78  - Pulse 83  - Resp 21  - Wt (!) 107.5 kg (237 lb)  - SpO2 92% Comment: on 2L [place] - BMI 35.00 kg/m??   General appearance - alert, well appearing, and in no distress.  Eyes - No lid lag or stare, no proptosis, EOM's intact.  Mouth - mucous membranes moist.  Neck - supple, no apparent goiter  Chest - clear, good excursion.  Heart - normal rate, regular rhythm.  Abdomen - non-distended, soft.   Neurological - no hand tremors. Normal gait.  Extremities - extremities warm. No lower extremity edema.Feet show no open lesions, slight dryness, no scale or calllus formation. The great toe on the right has thick ingrowing  toenail that is not causing any pain or irritation at this time. No warmth or edema  Skin - warm, dry, no visible rashes.  Psych - Normal mood, appropriate affect.  Muskuloskeletal - No kyphosis or spine tenderness.    Lab Review:  I've reviewed the patient's most recent pertinent labs in the electronic record and provided records.  No results found for: TSH, T3TOTAL, T4TOTAL, FREET4, TPERXAB, THYROG2NDGEN, [facility]        Lab Results   Component Value Date    CALCIUM 10.5 (H) 06/24/2021    ALBUMIN 4.1 06/24/2021    CREATININE 1.53 (H) 09/14/2021       Lab Results   Component Value Date    A1C 6.5 (A) 09/14/2021    GLU 98 06/24/2021    CREATININE 1.53 (H) 09/14/2021    CHOL 112 06/24/2021    HDL 40 06/24/2021    NONHDL 72 06/24/2021    LDL 51 06/24/2021    TRIG 604 06/24/2021          Radiology:    Thyroid US:  No results found for this or any previous visit.            DEXA:  No results found for this or any previous visit.           Other Medical Data :      I've personally reviewed and summarized records in EPIC/Media and via CareEverywhere as well as medication, allergies, past medical, social, and family history.

## 2021-09-15 ENCOUNTER — Ambulatory Visit: Payer: Medicare Other

## 2021-09-20 ENCOUNTER — Ambulatory Visit: Payer: Medicare Other

## 2021-09-20 DIAGNOSIS — L400 Psoriasis vulgaris: Principal | ICD-10-CM

## 2021-09-20 MED ORDER — SKYRIZI 150 MG/ML SUBCUTANEOUS PEN INJECTOR
0 refills | 0 days | Status: CP
Start: 2021-09-20 — End: ?

## 2021-09-20 MED ORDER — RISANKIZUMAB-RZAA 150 MG/ML SUBCUTANEOUS PEN INJECTOR
SUBCUTANEOUS | 1 refills | 0 days | Status: CP
Start: 2021-09-20 — End: ?
  Filled 2021-09-28: qty 1, 28d supply, fill #0

## 2021-09-22 ENCOUNTER — Ambulatory Visit: Admit: 2021-09-22 | Discharge: 2021-09-23 | Payer: MEDICARE

## 2021-09-22 ENCOUNTER — Ambulatory Visit: Payer: Medicare Other

## 2021-09-22 DIAGNOSIS — E785 Hyperlipidemia, unspecified: Principal | ICD-10-CM

## 2021-09-22 DIAGNOSIS — J449 Chronic obstructive pulmonary disease, unspecified: Principal | ICD-10-CM

## 2021-09-22 DIAGNOSIS — Z0000 Encounter for general adult medical examination without abnormal findings: Principal | ICD-10-CM

## 2021-09-22 DIAGNOSIS — N401 Enlarged prostate with lower urinary tract symptoms: Principal | ICD-10-CM

## 2021-09-22 DIAGNOSIS — E1169 Type 2 diabetes mellitus with other specified complication: Principal | ICD-10-CM

## 2021-09-22 DIAGNOSIS — I152 Hypertension secondary to endocrine disorders: Principal | ICD-10-CM

## 2021-09-22 DIAGNOSIS — J431 Panlobular emphysema: Principal | ICD-10-CM

## 2021-09-22 DIAGNOSIS — E1159 Type 2 diabetes mellitus with other circulatory complications: Principal | ICD-10-CM

## 2021-09-22 DIAGNOSIS — I5022 Chronic systolic (congestive) heart failure: Principal | ICD-10-CM

## 2021-09-22 DIAGNOSIS — R351 Nocturia: Principal | ICD-10-CM

## 2021-09-22 DIAGNOSIS — Z125 Encounter for screening for malignant neoplasm of prostate: Principal | ICD-10-CM

## 2021-09-22 MED ORDER — DOXYCYCLINE HYCLATE 100 MG CAPSULE
ORAL_CAPSULE | Freq: Two times a day (BID) | ORAL | 0 refills | 7 days | Status: CP
Start: 2021-09-22 — End: 2021-09-29

## 2021-09-22 MED ORDER — TAMSULOSIN 0.4 MG CAPSULE
ORAL_CAPSULE | Freq: Every day | ORAL | 3 refills | 90 days | Status: CP
Start: 2021-09-22 — End: 2022-09-22

## 2021-09-26 NOTE — Unmapped (Signed)
[facility] Specialty Medication Onboarding    Specialty Medication: Skyrizi 150mg /mL pen injection (loading dose)  Prior Authorization: Approved   Financial Assistance: No - copay  <$25  Final Copay/Day Supply: $0 / 56 days    Insurance Restrictions: None     Notes to Pharmacist: Max daily dose [id number], hence the 56ds      [facility] Specialty Medication Onboarding    Specialty Medication: Skyrizi 150mg /mL pen injection  Prior Authorization: Approved   Financial Assistance: No - copay  <$25  Final Copay/Day Supply: $0 / 90 days    Insurance Restrictions: None     Notes to Pharmacist: N/A    The triage team has completed the benefits investigation and has determined that the patient is able to fill this medication at [facility]. Please contact the patient to complete the onboarding or follow up with the prescribing physician as needed.

## 2021-09-27 ENCOUNTER — Ambulatory Visit: Payer: Medicare Other

## 2021-09-27 MED ORDER — EMPTY CONTAINER
2 refills | 0 days
Start: 2021-09-27 — End: ?

## 2021-09-27 NOTE — Unmapped (Signed)
[facility]   Patient Onboarding/Medication Counseling    Paul Velez is a 76 y.o. male with psoriasis who I am counseling today on initiation of therapy.  I am speaking to the patient.    Was a Nurse, learning disability used for this call? No    Verified patient's date of birth / HIPAA.    Specialty medication(s) to be sent: Inflammatory Disorders: Skyrizi      Non-specialty medications/supplies to be sent: sharps kit      Medications not needed at this time: na         Skyrizi (risankizumab)    Medication & Administration     Dosage: Plaque psoriasis: Inject 150mg  under the skin at weeks 0 and 4, then every 12 weeks thereafter    Lab tests required prior to treatment initiation:  Tuberculosis: Tuberculosis screening resulted in a non-reactive Quantiferon TB Gold assay.    Administration:     Pen:  Gather all supplies needed for injection on a clean, flat working surface: medication pen removed from packaging, alcohol swab, sharps container, etc.  Look at the medication label - look for correct medication, correct dose, and check the expiration date  Look at the medication through the medication inspection window - the liquid in the pen should appear clear and colorless to slightly yellow, you may see tiny white or clear particles  Lay the pen on a flat surface and allow it to warm up to room temperature for at least 30 minutes  Select injection site - you can use the front of your thigh or your belly (but not the area 2 inches around your belly button); if someone else is giving you the injection you can also use your upper arm in the skin covering your triceps muscle  Prepare injection site - wash your hands and clean the skin at the injection site with an alcohol swab and let it air dry, do not touch the injection site again before the injection  Hold the pen with your fingers on the gray grips so you can see the green activator button (it is on the side of the pen, not the top).  Pinch the skin - with your hand not holding the pen, pinch up a fold of skin at the injection site using your forefinger and thumb  Press the white needle sleeve of the pen down against the pinched skin, then press the green activator button.  You will hear a click, meaning the injection has started.  Note - the pen only activates if the white needle sleeve is pressed down before pressing the green activator button.  Continue holding the pen against your skin for 15 sections, until you hear a second click, or until the yellow indicator has filled the medication inspection window.  The injection is now complete.  Slowly pull the pen straight out from your skin and dispose of the used pen immediately in your sharps disposal container.   If you see any blood at the injection site, press a cotton ball or gauze on the site and maintain pressure until the bleeding stops, do not rub the injection site      Adherence/Missed dose instructions:  If your injection is given more than 7 days after your scheduled injection date - consult your pharmacist for additional instructions on how to adjust your dosing schedule.    Goals of Therapy     Plaque Psoriasis  Minimize areas of skin involvement (% BSA)  Avoidance of long term  glucocorticoid use  Maintenance of effective psychosocial functioning      Side Effects & Monitoring Parameters     Injection site reaction (redness, irritation, inflammation localized to the site of administration)  Signs of a common cold - minor sore throat, runny or stuffy nose, etc.  Felling tired/weak  Headache  Stomach, joint or back pain    The following side effects should be reported to the provider:  Signs of a hypersensitivity reaction - rash; hives; itching; red, swollen, blistered, or peeling skin; wheezing; tightness in the chest or throat; difficulty breathing, swallowing, or talking; swelling of the mouth, face, lips, tongue, or throat; etc.  Reduced immune function - report signs of infection such as fever; chills; body aches; very bad sore throat; ear or sinus pain; cough; more sputum or change in color of sputum; pain with passing urine; wound that will not heal, etc.  Also at a slightly higher risk of some malignancies (mainly skin and blood cancers) due to this reduced immune function.  In the case of signs of infection - the patient should hold the next dose of Skyrizi?? and call your primary care provider to ensure adequate medical care.  Treatment may be resumed when infection is treated and patient is asymptomatic.  Flu-like symptoms  Warm, red, or painful skin or sores on the body  Severe diarrhea or stomach pain      Contraindications, Warnings, & Precautions     Have your bloodwork checked as you have been told by your prescriber  Talk with your doctor if you are pregnant, planning to become pregnant, or breastfeeding  Discuss the possible need for holding your dose(s) of Skyrizi?? when a planned procedure is scheduled with the prescriber as it may delay healing/recovery timeline       Drug/Food Interactions     Medication list reviewed in Epic. The patient was instructed to inform the care team before taking any new medications or supplements. No drug interactions identified.   Talk with you prescriber or pharmacist before receiving any live vaccinations while taking this medication and after you stop taking it    Storage, Handling Precautions, & Disposal     Store this medication in the refrigerator.  Do not freeze   If needed, you may store at room temperature for up to 24 hours  Store in original packaging, protected from light  Do not shake  Dispose of used syringes/pens in a sharps disposal container          Current Medications (including OTC/herbals), Comorbidities and Allergies     Current Outpatient Medications   Medication Sig Dispense Refill    albuterol sulfate 90 mcg/actuation AePB Inhale.      amLODIPine (NORVASC) 5 MG tablet Take 1 tablet (5 mg total) by mouth daily.      atorvastatin (LIPITOR) 20 MG tablet       celecoxib (CELEBREX) 200 MG capsule       clobetasoL (TEMOVATE) 0.05 % ointment Apply topically Two (2) times a day. Use on psoriasis until smooth. 60 g 1    doxycycline (VIBRAMYCIN) 100 MG capsule Take 1 capsule (100 mg total) by mouth Two (2) times a day for 7 days. 14 capsule 0    DULoxetine (CYMBALTA) 60 MG capsule       edoxaban (SAVAYSA) 60 mg tablet Take 1 tablet (60 mg total) by mouth daily.      empagliflozin (JARDIANCE) 10 mg tablet Take 1 tablet (10 mg total) by mouth daily. 90  tablet 3    furosemide (LASIX) 80 MG tablet Take 1 tablet (80 mg total) by mouth daily.      gabapentin (NEURONTIN) 800 MG tablet Take 1 tablet (800 mg total) by mouth Three (3) times a day.      hydroCHLOROthiazide (HYDRODIURIL) 12.5 MG tablet       losartan (COZAAR) 50 MG tablet Take 1 tablet (50 mg total) by mouth daily.      metFORMIN (GLUCOPHAGE) 1000 MG tablet 500 mg in the am and 1000 mg in the evening      ONETOUCH VERIO TEST STRIPS Strp TEST TWICE DAILY      risankizumab-rzaa (SKYRIZI) 150 mg/mL PnIj Inject the contents of 1 pen (150mg ) under the skin at weeks 0 and 4 as loading doses. 2 mL 0    risankizumab-rzaa 150 mg/mL PnIj Inject the contents of 1 pen (150 mg total) under the skin Every three (3) months. 2 mL 1    semaglutide (OZEMPIC) 1 mg/dose (4 mg/3 mL) PnIj injection Inject under the skin every seven (7) days.      sotalol (BETAPACE) 80 MG tablet Take 1 tablet (80 mg total) by mouth Two (2) times a day.      tamsulosin (FLOMAX) 0.4 mg capsule Take 1 capsule (0.4 mg total) by mouth daily. 90 capsule 3    TRELEGY ELLIPTA 100-62.5-25 mcg inhaler       umeclidinium (INCRUSE ELLIPTA) 62.5 mcg/actuation inhaler Inhale 1 puff daily.       No current facility-administered medications for this visit.       Allergies   Allergen Reactions    Rosuvastatin Anaphylaxis     felt bad    Ace Inhibitors Anxiety    Lisinopril Other (See Comments)     Other reaction(s): Mental Status Change, Cough Brinzolamide-Brimonidine Itching     And burning  And burning         Patient Active Problem List   Diagnosis    Cardiomyopathy (CMS-HCC)    CHF (congestive heart failure), NYHA class II, chronic, systolic (CMS-HCC)    Chronic obstructive pulmonary disease (CMS-HCC)    Diabetes mellitus, type 2 (CMS-HCC)    Diabetic peripheral neuropathy (CMS-HCC)    Hypertension associated with diabetes (CMS-HCC)    ED (erectile dysfunction) of organic origin    Emphysema of lung (CMS-HCC)    Hyperlipidemia associated with type 2 diabetes mellitus (CMS-HCC)    Lung nodule    OSA (obstructive sleep apnea)    PAF (paroxysmal atrial fibrillation) (CMS-HCC)    Routine health maintenance    Anemia in other chronic diseases classified elsewhere    Benign prostatic hyperplasia with nocturia       Reviewed and up to date in Epic.    Appropriateness of Therapy     Acute infections noted within Epic:  No active infections  Patient reported infection: None    Is medication and dose appropriate based on diagnosis and infection status? Yes    Prescription has been clinically reviewed: Yes      Baseline Quality of Life Assessment      How many days over the past month did your psoriasis  keep you from your normal activities? For example, brushing your teeth or getting up in the morning. 0    Financial Information     Medication Assistance provided: Prior Authorization    Anticipated copay of $0 reviewed with patient. Verified delivery address.    Delivery Information     Scheduled delivery date:  Wed, May 31    Expected start date: Wed, May 31    Medication will be delivered via Same Day Courier to the prescription address in [place].  This shipment will not require a signature.      Explained the services we provide at [facility] and that each month we would call to set up refills.  Stressed importance of returning phone calls so that we could ensure they receive their medications in time each month.  Informed patient that we should be setting up refills 7-10 days prior to when they will run out of medication.  A pharmacist will reach out to perform a clinical assessment periodically.  Informed patient that a welcome packet, containing information about our pharmacy and other support services, a Notice of Privacy Practices, and a drug information handout will be sent.      The patient or caregiver noted above participated in the development of this care plan and knows that they can request review of or adjustments to the care plan at any time.      Patient or caregiver verbalized understanding of the above information as well as how to contact the pharmacy at [phone] option 4 with any questions/concerns.  The pharmacy is open Monday through Friday 8:30am-4:30pm.  A pharmacist is available 24/7 via pager to answer any clinical questions they may have.    Patient Specific Needs     Does the patient have any physical, cognitive, or cultural barriers? No    Does the patient have adequate living arrangements? (i.e. the ability to store and take their medication appropriately) Yes    Did you identify any home environmental safety or security hazards? No    Patient prefers to have medications discussed with  Patient     Is the patient or caregiver able to read and understand education materials at a high school level or above? Yes    Patient's primary language is  English     Is the patient high risk? No      Jazma Pickel A Desiree Lucy [facility] Specialty Pharmacist

## 2021-09-28 ENCOUNTER — Ambulatory Visit
Admission: RE | Admit: 2021-09-28 | Discharge: 2021-09-28 | Disposition: A | Discharge status: Home / Self Care | Payer: Medicare Other | Source: Ambulatory Visit | Attending: Emergency Medicine | Admitting: Emergency Medicine

## 2021-09-28 VITALS — BP 116/77 | HR 86 | Temp 98.9°F | Resp 18

## 2021-09-28 DIAGNOSIS — M5442 Lumbago with sciatica, left side: Secondary | ICD-10-CM

## 2021-09-28 DIAGNOSIS — M5441 Lumbago with sciatica, right side: Secondary | ICD-10-CM | POA: Diagnosis not present

## 2021-09-28 MED ORDER — OXYCODONE-ACETAMINOPHEN 5-325 MG PO TABS
1.0000 | ORAL_TABLET | Freq: Three times a day (TID) | ORAL | 0 refills | Status: DC | PRN
Start: 2021-09-28 — End: 2021-11-18

## 2021-09-28 MED FILL — EMPTY CONTAINER: 120 days supply | Qty: 1 | Fill #0

## 2021-09-28 NOTE — ED Provider Notes (Signed)
[facility]    CSN: [id number] Arrival date & time: 09/28/21  1342      History   Chief Complaint Chief Complaint  Patient presents with   Back Pain    I have been diagnosed with sciatica but have not been relieved of the pain. - Entered by patient   Appointment    HPI Mario [name] is a 76 y.o. male.   Patient presents with constant bilateral lower back pain radiating into the bilateral legs with associated numbness and tingling for 4 to 6 weeks.  Pain is worsened by long periods of sitting, lying and changes in movement.  Occasionally interfering with sleep.  Pain causing legs to feel weak and as if they may buckle.  Having some episodes of incontinence due to moving slowly to get to the restroom.  Has attempted use of muscle relaxers, Tylenol, oxycodone, prednisone and spinal injections which have been ineffective.  Has completed a MRI within the last month.  Currently followed by orthopedics who has recommended surgery which patient has declined.  Has attempted use of a compression belt which did provide temporary relief.     Past Medical History:  Diagnosis Date   Arthritis    hands, knees   Atrial fibrillation (HCC)    CHF (congestive heart failure) (HCC)    COPD (chronic obstructive pulmonary disease) ([place])    Diabetes mellitus without complication (HCC)    Diabetic neuropathy ([place])    Heart murmur    in past, mild 1969   Hypertension    resolved after weight loss and more activity   Neuromuscular disorder ([place])    neuropathy   Neuropathy    Pulmonary embolism ([place])    approx 2014   Shingles '99   Sleep apnea    CPAP   Wears dentures    full upper and lower    Patient Active Problem List   Diagnosis Date Noted   Bilateral primary osteoarthritis of knee 06/11/2019   Cervical radicular pain 06/11/2019   Cervical facet joint syndrome 06/11/2019   Lumbar facet arthropathy 06/11/2019   Lumbar radiculopathy 06/11/2019   Chronic pain syndrome  06/11/2019   Lung nodule 02/09/2016   Exercise hypoxemia 12/28/2015   Chronic obstructive pulmonary disease ([place]) 03/16/2015   Nuclear sclerosis 03/16/2015   OSA (obstructive sleep apnea) 03/16/2015   Primary open angle glaucoma of both eyes, severe stage 03/16/2015   Type 2 diabetes mellitus without complication, without long-term current use of insulin ([place]) 03/16/2015   Arthralgia of left lower leg 10/29/2014   Osteoarthritis of left knee 09/10/2014   Type 2 diabetes mellitus with diabetic neuropathy ([place]) 08/04/2014   Cataract, nuclear sclerotic senile 04/01/2014   Combined forms of age-related cataract 04/01/2014   Keratitis sicca, 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   Primary open angle glaucoma 02/27/2014   Flat feet 01/29/2014   Foot pain, bilateral 01/29/2014   Tinea pedis of both feet 01/29/2014   Ptosis of both eyelids 11/25/2013   Type 2 diabetes mellitus without retinopathy ([place]) 11/25/2013   Onychomycosis 123456   Chronic systolic heart failure ([place]) 11/11/2013   Emphysema of lung ([place]) 11/11/2013   Essential hypertension 11/11/2013   Screen for colon cancer 11/05/2013   Cardiomyopathy ([place]) 123XX123   Systolic HF (heart failure) ([place]) 123XX123   Acute systolic CHF (congestive heart failure) ([place]) 09/16/2013   COPD (chronic obstructive pulmonary disease) ([place]) 09/16/2013   HTN (hypertension) 09/16/2013   Obesity 09/16/2013   PAF (paroxysmal atrial fibrillation) ([place]) 09/16/2013  Senile nuclear sclerosis 05/22/2013   Muscle weakness (generalized) 11/11/2009   Long term current use of anticoagulant therapy 06/17/2009   Pulmonary embolism (HCC) 06/17/2009   ED (erectile dysfunction) of organic origin 05/13/2003    Past Surgical History:  Procedure Laterality Date   CATARACT EXTRACTION W/PHACO Left 05/12/2019   Procedure: CATARACT EXTRACTION PHACO AND INTRAOCULAR LENS PLACEMENT (IOC) LEFTDIABETIC ISTENT INJ 2.43  00:28.6;  Surgeon: Nevada Crane, MD;  Location:  [facility];  Service: Ophthalmology;  Laterality: Left;  Diabetic - oral meds sleep apnea   CATARACT EXTRACTION W/PHACO Right 06/02/2019   Procedure: CATARACT EXTRACTION PHACO AND INTRAOCULAR LENS PLACEMENT (IOC) ISTENT INJ  RIGHT DIABETIC 1.31  00:25.4;  Surgeon: Nevada Crane, MD;  Location: [facility];  Service: Ophthalmology;  Laterality: Right;   COLONOSCOPY  08/30/2015   FINGER SURGERY Right    3'rd finger, right hand   KNEE SURGERY Right    screw in knee   WRIST SURGERY Left    metal removed       Home Medications    Prior to Admission medications   Medication Sig Start Date End Date Taking? Authorizing Provider  albuterol (PROVENTIL HFA;VENTOLIN HFA) 108 (90 Base) MCG/ACT inhaler Inhale 2 puffs into the lungs as needed. 11/23/15  Yes [provider]  APPLE CIDER VINEGAR PO Take by mouth daily.   Yes [provider]  celecoxib (CELEBREX) 200 MG capsule  11/17/18  Yes [provider]  DULoxetine (CYMBALTA) 60 MG capsule am 11/10/18  Yes [provider]  edoxaban (SAVAYSA) 60 MG TABS tablet Take 60 mg by mouth daily. cardiology 07/02/15  Yes [provider]  Fluticasone-Umeclidin-Vilant (TRELEGY ELLIPTA IN) Inhale into the lungs daily.   Yes [provider]  furosemide (LASIX) 80 MG tablet Take 1 tablet by mouth 2 (two) times daily. 80 mg AM, 120 mg PM 08/23/15  Yes [provider]  gabapentin (NEURONTIN) 300 MG capsule Take 800 mg by mouth 3 (three) times daily.  07/17/15  Yes [provider]  gabapentin (NEURONTIN) 800 MG tablet Take 800 mg by mouth 3 (three) times daily. 06/08/19  Yes [provider]  glucose blood test strip Use as instructed FOR TESTING three times daily.  E11.9 06/01/15  Yes [provider]  ipratropium-albuterol (DUONEB) 0.5-2.5 (3) MG/3ML SOLN Inhale into the lungs. 12/28/15  Yes [provider]  metFORMIN (GLUCOPHAGE) 1000 MG tablet Take 1 tablet  by mouth 2 (two) times daily. 08/12/15  Yes [provider]  metFORMIN (GLUCOPHAGE) 500 MG tablet  06/08/19  Yes [provider]  OZEMPIC, 1 MG/DOSE, 2 MG/1.5ML SOPN  11/20/18  Yes [provider]  ROCKLATAN 0.02-0.005 % SOLN Apply 1 drop to eye at bedtime. 06/20/19  Yes [provider]  sotalol (BETAPACE) 80 MG tablet Take 1 tablet by mouth 2 (two) times daily. 08/25/14  Yes [provider]  SOTALOL AF 80 MG TABS Take by mouth 2 (two) times daily. Breakfast and dinner   Yes [provider]  TURMERIC PO Take by mouth daily.   Yes [provider]  hydrochlorothiazide (HYDRODIURIL) 12.5 MG tablet Take 1 tablet (12.5 mg total) by mouth daily. 06/07/21 08/03/21  Gilles Chiquito, MD  losartan (COZAAR) 50 MG tablet Take 1 tablet by mouth daily. 07/05/15 11/29/18  [provider]    Family History History reviewed. No pertinent family history.  Social History Social History   Tobacco Use   Smoking status: Former  Packs/day: 0.50    Years: 1.00    Pack years: 0.50    Types: Cigarettes    Quit date: 05/01/1969    Years since quitting: 52.4   Smokeless tobacco: Never   Tobacco comments:    Quit 1971  Vaping Use   Vaping Use: Never used  Substance Use Topics   Alcohol use: No   Drug use: No     Allergies   Rosuvastatin, Ace inhibitors, Lisinopril, and Simbrinza [brinzolamide-brimonidine]   Review of Systems Review of Systems  Constitutional: Negative.   Respiratory: Negative.    Cardiovascular: Negative.   Musculoskeletal:  Positive for back pain. Negative for arthralgias, gait problem, joint swelling, myalgias, neck pain and neck stiffness.  Skin: Negative.   Neurological:  Positive for numbness. Negative for dizziness, tremors, seizures, syncope, facial asymmetry, speech difficulty, weakness, light-headedness and headaches.    Physical Exam Triage Vital Signs ED Triage Vitals  Enc Vitals Group     BP 09/28/21  1407 116/77     Pulse Rate 09/28/21 1407 86     Resp 09/28/21 1407 18     Temp 09/28/21 1407 98.9 F (37.2 C)     Temp Source 09/28/21 1407 Oral     SpO2 09/28/21 1407 96 %     Weight --      Height --      Head Circumference --      Peak Flow --      Pain Score 09/28/21 1410 0     Pain Loc --      Pain Edu? --      Excl. in [place]? --    No data found.  Updated Vital Signs BP 116/77 (BP Location: Left Arm)   Pulse 86   Temp 98.9 F (37.2 C) (Oral)   Resp 18   SpO2 96%   Visual Acuity Right Eye Distance:   Left Eye Distance:   Bilateral Distance:    Right Eye Near:   Left Eye Near:    Bilateral Near:     Physical Exam Constitutional:      Appearance: Normal appearance.  HENT:     Head: Normocephalic.  Eyes:     Extraocular Movements: Extraocular movements intact.  Pulmonary:     Effort: Pulmonary effort is normal.  Musculoskeletal:     Comments: Tenderness along the midline of the lumbar region without ecchymosis, swelling or deformity, able to bear weight on bilateral legs, rom of legs intact, leaning towards the right side while sitting   Skin:    General: Skin is warm and dry.  Neurological:     Mental Status: He is alert and oriented to person, place, and time. Mental status is at baseline.  Psychiatric:        Mood and Affect: Mood normal.        Behavior: Behavior normal.     UC Treatments / Results  Labs (all labs ordered are listed, but only abnormal results are displayed) Labs Reviewed - No data to display  EKG   Radiology No results found.  Procedures Procedures (including critical care time)  Medications Ordered in UC Medications - No data to display  Initial Impression / Assessment and Plan / UC Course  I have reviewed the triage vital signs and the nursing notes.  Pertinent labs & imaging results that were available during my care of the patient were reviewed by me and considered in my medical decision making (see chart for  details).  Acute bilateral  low back pain with bilateral sciatica  Patient has been dealing with back pain for the last 4 to 6 weeks, last MRI completed on 08/27/2021 by orthopedic doctor, has not reviewed results with patient has upcoming appointment this Wednesday for reevaluation, received spinal injections on 09/12/2021 which patient endorses were ineffective, has also taken a prednisone taper over the last week also deemed ineffective, discussed with patient that multiple medications have been used to attempt to relieve him of discomfort, has taken Norco, tramadol, Tylenol, Flexeril, Lidoderm patches which all have provided no relief, pain referral placed as patient does not want to move forward with recommended surgical procedure, Percocet prescribed for outpatient use, PDMP reviewed, low risk, advised patient to keep upcoming appointment with orthopedic, given walker referral to [facility] as patient has discussed wanting second opinion, patient may continue use of lumbar support if providing comfort, may follow-up with this urgent care as needed Final Clinical Impressions(s) / UC Diagnoses   Final diagnoses:  None   Discharge Instructions   None    ED Prescriptions   None    PDMP not reviewed this encounter.   Hans Eden, NP 09/28/21 1556

## 2021-09-28 NOTE — ED Triage Notes (Signed)
Patient c/o low back pain, diagnosed with sciatica, having a lot of pain x 1 month.  Patient has had injections, applied heating pads, looking for help with pain.

## 2021-09-28 NOTE — Discharge Instructions (Signed)
A referral has been placed for pain clinic, ideally they will reach out to you within the next week to schedule appointment, if you have not heard anything by next Wednesday you may call the number above  If have been prescribed Percocet at an attempt to help reduce your pain, you may use 1 to 2 tablets every 8 hours as needed  Keep your upcoming appointment with your orthopedic specialist for further evaluation and management of your back  You may get a second opinion from [facility], you may schedule an appointment or go to their walk-in urgent care, information is listed on the front page for address and telephone number  You may continue use of your compression wrap, we did not have a similar product or brace here in office, you will need to order a compression wrap in your size   You may use heating pad in 15 minute intervals as needed for additional comfort   When lying down place pillow behind back   Can try sleeping without pillow on firm mattress

## 2021-09-29 ENCOUNTER — Ambulatory Visit: Payer: Medicare Other

## 2021-10-04 NOTE — Unmapped (Signed)
Pt called stating he was referred to Pulmonary Rehab in [facility] and he wishes to have a referral to [place].  Please call the patient at [phone].  Thank you.

## 2021-10-04 NOTE — Unmapped (Signed)
Contacted patient and he prefers to go to [facility] as it is closer to his home; referral changed and faxed there. Thanks

## 2021-10-06 ENCOUNTER — Encounter: Payer: Self-pay | Admitting: Student in an Organized Health Care Education/Training Program

## 2021-10-06 ENCOUNTER — Ambulatory Visit
Payer: Medicare Other | Attending: Student in an Organized Health Care Education/Training Program | Admitting: Student in an Organized Health Care Education/Training Program

## 2021-10-06 VITALS — BP 137/89 | HR 76 | Temp 97.1°F | Ht 69.0 in | Wt 239.0 lb

## 2021-10-06 DIAGNOSIS — M47816 Spondylosis without myelopathy or radiculopathy, lumbar region: Secondary | ICD-10-CM | POA: Diagnosis present

## 2021-10-06 DIAGNOSIS — G894 Chronic pain syndrome: Secondary | ICD-10-CM | POA: Insufficient documentation

## 2021-10-06 DIAGNOSIS — G8929 Other chronic pain: Secondary | ICD-10-CM | POA: Insufficient documentation

## 2021-10-06 DIAGNOSIS — M48062 Spinal stenosis, lumbar region with neurogenic claudication: Secondary | ICD-10-CM | POA: Diagnosis present

## 2021-10-06 DIAGNOSIS — M533 Sacrococcygeal disorders, not elsewhere classified: Secondary | ICD-10-CM | POA: Insufficient documentation

## 2021-10-06 DIAGNOSIS — M5416 Radiculopathy, lumbar region: Secondary | ICD-10-CM | POA: Diagnosis present

## 2021-10-06 DIAGNOSIS — M7918 Myalgia, other site: Secondary | ICD-10-CM | POA: Diagnosis present

## 2021-10-06 NOTE — Progress Notes (Signed)
Safety precautions to be maintained throughout the outpatient stay will include: orient to surroundings, keep bed in low position, maintain call bell within reach at all times, provide assistance with transfer out of bed and ambulation.  

## 2021-10-06 NOTE — Progress Notes (Signed)
PROVIDER NOTE: Information contained herein reflects review and annotations entered in association with encounter. Interpretation of such information and data should be left to medically-trained personnel. Information provided to patient can be located elsewhere in the medical record under "Patient Instructions". Document created using STT-dictation technology, any transcriptional errors that may result from process are unintentional.    Patient: Mario [name]  Service Category: E/M  Provider: Gillis Santa, MD  DOB: 1945/07/31  DOS: 10/06/2021  Specialty: Interventional Pain Management  MRN: [id number]  Setting: Ambulatory outpatient  PCP: Delma Freeze, MD  Type: Established Patient    Referring Provider: Hans Eden, NP  Location: Office  Delivery: Face-to-face     HPI  [name], a 76 y.o. 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 [name] primary complain today is  Pain Assessment: Severity of Chronic pain is reported as a 5 /10. Location: Buttocks Right, Left, Lower/pain radiaties up and down his legs. Onset: More than a month ago. Quality: Aching, Constant, Discomfort, Throbbing, Stabbing, Sharp, Spasm, Shooting. Timing: Constant. Modifying factor(s): laying down, sitting for a short times, meds. Vitals:  height is '5\' 9"'  (1.753 m) and weight is 239 lb (108.4 kg). His temperature is 97.1 F (36.2 C) (abnormal). His blood pressure is 137/89 and his pulse is 76. His oxygen saturation is 89% (abnormal).   Reason for encounter: patient-requested evaluation.  [name] follows up today with low back pain, buttock pain that radiates down bilateral legs.  He describes it as throbbing, burning, tingling.  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  He states that his knees are doing well and that the genicular nerve block was  very beneficial.  Patient is status post bilateral L5-S1 transforaminal ESI on 09/12/2021 without any pain relief.  He is a type II diabetic with his blood sugars well maintained, A1c less than 6.5.  He has tried various medications including oxycodone, Tylenol, Cymbalta, gabapentin with limited response.  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  He is accompanied today by his girlfriend.  He was referred from Dr. Alba Destine to consider alternative therapies for his low back pain.  He has completed a lumbar MRI which is below.  He describes pain most pronounced in his buttock area.  ROS  Constitutional: Denies any fever or chills Gastrointestinal: No reported hemesis, hematochezia, vomiting, or acute GI distress Musculoskeletal:  Low back, bilateral leg pain Neurological: No reported episodes of acute onset apraxia, aphasia, dysarthria, agnosia, amnesia, paralysis, loss of coordination, or loss of consciousness  Medication Review  Apple Cider Vinegar, DULoxetine, Fluticasone-Umeclidin-Vilant, Netarsudil-Latanoprost, Risankizumab-rzaa, SOTALOL AF, Semaglutide (1 MG/DOSE), Turmeric, albuterol, celecoxib, edoxaban, empagliflozin, furosemide, gabapentin, glucose blood, hydrochlorothiazide, ipratropium-albuterol, losartan, metFORMIN, and oxyCODONE-acetaminophen  History Review  Allergy: [name] is allergic to rosuvastatin, ace inhibitors, lisinopril, and simbrinza [brinzolamide-brimonidine]. Drug: [name]  reports no history of drug use. Alcohol:  reports no history of alcohol use. Tobacco:  reports that he quit smoking about 52 years ago. His smoking use included cigarettes. He has a 0.50 pack-year smoking history. He has never used smokeless tobacco. Social: [name]  reports that he quit smoking about 52 years ago. His smoking use included cigarettes. He has a 0.50 pack-year smoking history. He has never used smokeless tobacco. He reports that he  does not  drink alcohol and does not use drugs. Medical:  has a past medical history of Arthritis, Atrial fibrillation ([place]), CHF (congestive heart failure) ([place]), COPD (chronic obstructive pulmonary disease) ([place]), Diabetes mellitus without complication ([place]), Diabetic neuropathy ([place]), Heart murmur, Hypertension, Neuromuscular disorder ([place]), Neuropathy, Pulmonary embolism ([place]), Shingles ('99), Sleep apnea, and Wears dentures. Surgical: [name]  has a past surgical history that includes Wrist surgery (Left); Knee surgery (Right); Colonoscopy (08/30/2015); Cataract extraction w/PHACO (Left, 05/12/2019); Finger surgery (Right); and Cataract extraction w/PHACO (Right, 06/02/2019). Family: family history is not on file.  Laboratory Chemistry Profile   Renal Lab Results  Component Value Date   BUN 22 06/07/2021   CREATININE 1.29 (H) 06/07/2021   BCR 20 02/15/2016   GFRAA 69 02/15/2016   GFRNONAA 58 (L) 06/07/2021    Hepatic Lab Results  Component Value Date   ALBUMIN 4.3 02/15/2016    Electrolytes Lab Results  Component Value Date   NA 138 06/07/2021   K 4.8 06/07/2021   CL 103 06/07/2021   CALCIUM 8.6 (L) 06/07/2021   PHOS 3.2 02/15/2016    Bone No results found for: "VD25OH", "VD125OH2TOT", "AL9379KW4", "OX7353GD9", "25OHVITD1", "25OHVITD2", "25OHVITD3", "TESTOFREE", "TESTOSTERONE"  Inflammation (CRP: Acute Phase) (ESR: Chronic Phase) No results found for: "CRP", "ESRSEDRATE", "LATICACIDVEN"       Note: Above Lab results reviewed.  Recent Imaging Review  MR LUMBAR SPINE WO CONTRAST CLINICAL DATA:  Chronic low back pain radiating to both legs over the last 6 weeks.  EXAM: MRI LUMBAR SPINE WITHOUT CONTRAST  TECHNIQUE: Multiplanar, multisequence MR imaging of the lumbar spine was performed. No intravenous contrast was administered.  COMPARISON:  04/03/2018  FINDINGS: Segmentation:  5 lumbar type vertebral bodies.  Alignment:  Normal  Vertebrae:  Normal  Conus  medullaris and cauda equina: Conus extends to the L1-2 level. Conus and cauda equina appear normal.  Paraspinal and other soft tissues: Normal  Disc levels:  Minimal non-compressive disc bulges at L2-3 and above, unchanged since the prior study.  L3-4: Desiccation of the disc with circumferential disc bulge. Mild facet and ligamentous prominence. Mild narrowing of the lateral recesses and neural foramina but without visible neural compression. No change.  L4-5: Circumferential disc bulge. Bilateral facet osteoarthritis. Small synovial cyst previously seen projecting medial from the right facet is no longer visible. No central canal stenosis. Mild narrowing of both lateral recesses. Foraminal stenosis right worse than left. L4 nerve compression could occur, particularly on the right. Findings in general appear to have worsened slightly.  L5-S1: Disc degeneration with loss of disc height. Endplate osteophytes and bulging of the disc. No central canal stenosis. Mild facet hypertrophy. Bilateral foraminal stenosis due to encroachment by osteophyte and bulging disc material could compress either or both L5 nerves. Similar appearance to the prior exam.  IMPRESSION: L3-4: Mild chronic degenerative changes without visible neural compression or progression.  L4-5: Disc bulge. Facet degeneration and hypertrophy. Stenosis of the lateral recesses and foramina could possibly cause neural compression. Foraminal stenosis slightly worse on the right. Slight worsening since 2019. Small synovial cyst previously seen is no longer visible.  L5-S1: Disc and facet degeneration. Bilateral foraminal stenosis could compress either or both L5 nerves. Similar appearance to the prior study.  Electronically Signed   By: Nelson Chimes M.D.   On: 08/29/2021 10:21 Note: Reviewed        Physical Exam  General appearance: Well nourished, well developed, and well hydrated. In no apparent acute  distress Mental status: Alert, oriented x  3 (person, place, & time)       Respiratory: Oxygen-dependent COPD Eyes: PERLA Vitals: BP 137/89   Pulse 76   Temp (!) 97.1 F (36.2 C)   Ht '5\' 9"'  (1.753 m)   Wt 239 lb (108.4 kg)   SpO2 (!) 89% Comment: 2 liters  BMI 35.29 kg/m  BMI: Estimated body mass index is 35.29 kg/m as calculated from the following:   Height as of this encounter: '5\' 9"'  (1.753 m).   Weight as of this encounter: 239 lb (108.4 kg). Ideal: Ideal body weight: 70.7 kg (155 lb 13.8 oz) Adjusted ideal body weight: 85.8 kg (189 lb 1.9 oz)  Lumbar Spine Area Exam  Skin & Axial Inspection: No masses, redness, or swelling Alignment: Symmetrical Functional ROM: Pain restricted ROM affecting both sides Stability: No instability detected Muscle Tone/Strength: Functionally intact. No obvious neuro-muscular anomalies detected. Sensory (Neurological): Neurogenic pain pattern and dermatomal  Gait & Posture Assessment  Ambulation: Patient came in today in a wheel chair Gait: Age-related, senile gait pattern Posture: Difficulty standing up straight, due to pain  Lower Extremity Exam    Side: Right lower extremity  Side: Left lower extremity  Stability: No instability observed          Stability: No instability observed          Skin & Extremity Inspection: Skin color, temperature, and hair growth are WNL. No peripheral edema or cyanosis. No masses, redness, swelling, asymmetry, or associated skin lesions. No contractures.  Skin & Extremity Inspection: Skin color, temperature, and hair growth are WNL. No peripheral edema or cyanosis. No masses, redness, swelling, asymmetry, or associated skin lesions. No contractures.  Functional ROM: Pain restricted ROM for all joints of the lower extremity positive straight leg raise test          Functional ROM: Pain restricted ROM for all joints of the lower extremity positive straight leg raise test          Muscle Tone/Strength: Functionally  intact. No obvious neuro-muscular anomalies detected.  Muscle Tone/Strength: Functionally intact. No obvious neuro-muscular anomalies detected.  Sensory (Neurological): Unimpaired        Sensory (Neurological): Unimpaired        DTR: Patellar: deferred today Achilles: deferred today Plantar: deferred today  DTR: Patellar: deferred today Achilles: deferred today Plantar: deferred today  Palpation: No palpable anomalies  Palpation: No palpable anomalies    Assessment   Diagnosis Status  1. Spinal stenosis, lumbar region, with neurogenic claudication   2. Chronic radicular lumbar pain   3. Lumbar facet arthropathy   4. Sacroiliac joint pain   5. Piriformis muscle pain   6. Chronic pain syndrome    Having a Flare-up Having a Flare-up Having a Flare-up   Updated Problems: Problem  Spinal Stenosis, Lumbar Region, With Neurogenic Claudication  Sacroiliac Joint Pain  Piriformis Muscle Pain  Chronic Radicular Lumbar Pain  Chronic Pain Syndrome    Plan of Care  Problem-specific:  Chronic radicular lumbar pain Reviewed MRI with patient in great detail.  He has facet degeneration and hypertrophy at L4-L5 with stenosis of the lateral recess and foramina right greater than left.  He also has bilateral foraminal stenosis at L5 nerves at L5-S1.  He is status post bilateral L5-S1 transforaminal ESI with limited response.  We discussed trying a L4-L5 interlaminar injection to see if that provided better analgesic/functional benefit.  We also discussed spinal cord stimulation.  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  Sacroiliac joint pain Bilateral SI joint and piriformis pain.  Consider diagnostic SI joint and piriformis injection.  Chronic pain syndrome Continue with physical therapy which patient is starting next week.  Defer medication management to primary care provider.   Patient is on high-dose gabapentin.  He has not responded to opioid trials in the past.  Is also on Cymbalta 60 mg.  We will focus on interventional therapies.  [name] has a current medication list which includes the following long-term medication(s): albuterol, duloxetine, furosemide, gabapentin, gabapentin, ipratropium-albuterol, metformin, sotalol af, hydrochlorothiazide, and [DISCONTINUED] losartan.    Orders:  Orders Placed This Encounter  Procedures   Lumbar Epidural Injection    Standing Status:   Future    Standing Expiration Date:   11/05/2021    Scheduling Instructions:     Procedure: Interlaminar Lumbar Epidural Steroid injection (LESI)            Laterality: L4/5     Sedation: none     Timeframe: ASAA    Order Specific Question:   Where will this procedure be performed?    Answer:   [facility]   Follow-up plan:   Return in about 3 months (around 01/06/2022) for Medication Management, in person.    Recent Visits No visits were found meeting these conditions. Showing recent visits within past 90 days and meeting all other requirements Today's Visits Date Type Provider Dept  10/06/21 Office Visit Gillis Santa, MD [facility]  Showing today's visits and meeting all other requirements Future Appointments Date Type Provider Dept  12/29/21 Appointment Gillis Santa, MD [facility]  Showing future appointments within next 90 days and meeting all other requirements  I discussed the assessment and treatment plan with the patient. The patient was provided an opportunity to ask questions and all were answered. The patient agreed with the plan and demonstrated an understanding of the instructions.  Patient advised to call back or seek an in-person evaluation if the symptoms or condition worsens.  Duration of encounter: 54mnutes.  Note by: BGillis Santa MD Date: 10/06/2021; Time: 9:33 AM

## 2021-10-06 NOTE — Patient Instructions (Addendum)
Need documented clearance to stop Savaysa 3 days prior to L4/5 ESI  ____________________________________________________________________________________________  General Risks and Possible Complications  Patient Responsibilities: It is important that you read this as it is part of your informed consent. It is our duty to inform you of the risks and possible complications associated with treatments offered to you. It is your responsibility as a patient to read this and to ask questions about anything that is not clear or that you believe was not covered in this document.  Patient's Rights: You have the right to refuse treatment. You also have the right to change your mind, even after initially having agreed to have the treatment done. However, under this last option, if you wait until the last second to change your mind, you may be charged for the materials used up to that point.  Introduction: Medicine is not an Visual merchandiserexact science. Everything in Medicine, including the lack of treatment(s), carries the potential for danger, harm, or loss (which is by definition: Risk). In Medicine, a complication is a secondary problem, condition, or disease that can aggravate an already existing one. All treatments carry the risk of possible complications. The fact that a side effects or complications occurs, does not imply that the treatment was conducted incorrectly. It must be clearly understood that these can happen even when everything is done following the highest safety standards.  No treatment: You can choose not to proceed with the proposed treatment alternative. The "PRO(s)" would include: avoiding the risk of complications associated with the therapy. The "CON(s)" would include: not getting any of the treatment benefits. These benefits fall under one of three categories: diagnostic; therapeutic; and/or palliative. Diagnostic benefits include: getting information which can ultimately lead to improvement of the  disease or symptom(s). Therapeutic benefits are those associated with the successful treatment of the disease. Finally, palliative benefits are those related to the decrease of the primary symptoms, without necessarily curing the condition (example: decreasing the pain from a flare-up of a chronic condition, such as incurable terminal cancer).  General Risks and Complications: These are associated to most interventional treatments. They can occur alone, or in combination. They fall under one of the following six (6) categories: no benefit or worsening of symptoms; bleeding; infection; nerve damage; allergic reactions; and/or death. No benefits or worsening of symptoms: In Medicine there are no guarantees, only probabilities. No healthcare provider can ever guarantee that a medical treatment will work, they can only state the probability that it may. Furthermore, there is always the possibility that the condition may worsen, either directly, or indirectly, as a consequence of the treatment. Bleeding: This is more common if the patient is taking a blood thinner, either prescription or over the counter (example: Goody Powders, Fish oil, Aspirin, Garlic, etc.), or if suffering a condition associated with impaired coagulation (example: Hemophilia, cirrhosis of the liver, low platelet counts, etc.). However, even if you do not have one on these, it can still happen. If you have any of these conditions, or take one of these drugs, make sure to notify your treating physician. Infection: This is more common in patients with a compromised immune system, either due to disease (example: diabetes, cancer, human immunodeficiency virus [HIV], etc.), or due to medications or treatments (example: therapies used to treat cancer and rheumatological diseases). However, even if you do not have one on these, it can still happen. If you have any of these conditions, or take one of these drugs, make sure to notify your treating  physician. Nerve Damage: This is more common when the treatment is an invasive one, but it can also happen with the use of medications, such as those used in the treatment of cancer. The damage can occur to small secondary nerves, or to large primary ones, such as those in the spinal cord and brain. This damage may be temporary or permanent and it may lead to impairments that can range from temporary numbness to permanent paralysis and/or brain death. Allergic Reactions: Any time a substance or material comes in contact with our body, there is the possibility of an allergic reaction. These can range from a mild skin rash (contact dermatitis) to a severe systemic reaction (anaphylactic reaction), which can result in death. Death: In general, any medical intervention can result in death, most of the time due to an unforeseen complication. ____________________________________________________________________________________________ Epidural Steroid Injection Patient Information  Description: The epidural space surrounds the nerves as they exit the spinal cord.  In some patients, the nerves can be compressed and inflamed by a bulging disc or a tight spinal canal (spinal stenosis).  By injecting steroids into the epidural space, we can bring irritated nerves into direct contact with a potentially helpful medication.  These steroids act directly on the irritated nerves and can reduce swelling and inflammation which often leads to decreased pain.  Epidural steroids may be injected anywhere along the spine and from the neck to the low back depending upon the location of your pain.   After numbing the skin with local anesthetic (like Novocaine), a small needle is passed into the epidural space slowly.  You may experience a sensation of pressure while this is being done.  The entire block usually last less than 10 minutes.  Conditions which may be treated by epidural steroids:  Low back and leg pain Neck and arm  pain Spinal stenosis Post-laminectomy syndrome Herpes zoster (shingles) pain Pain from compression fractures  Preparation for the injection:  Do not eat any solid food or dairy products within 8 hours of your appointment.  You may drink clear liquids up to 3 hours before appointment.  Clear liquids include water, black coffee, juice or soda.  No milk or cream please. You may take your regular medication, including pain medications, with a sip of water before your appointment  Diabetics should hold regular insulin (if taken separately) and take 1/2 normal NPH dos the morning of the procedure.  Carry some sugar containing items with you to your appointment. A driver must accompany you and be prepared to drive you home after your procedure.  Bring all your current medications with your. An IV may be inserted and sedation may be given at the discretion of the physician.   A blood pressure cuff, EKG and other monitors will often be applied during the procedure.  Some patients may need to have extra oxygen administered for a short period. You will be asked to provide medical information, including your allergies, prior to the procedure.  We must know immediately if you are taking blood thinners (like Coumadin/Warfarin)  Or if you are allergic to IV iodine contrast (dye). We must know if you could possible be pregnant.  Possible side-effects: Bleeding from needle site Infection (rare, may require surgery) Nerve injury (rare) Numbness & tingling (temporary) Difficulty urinating (rare, temporary) Spinal headache ( a headache worse with upright posture) Light -headedness (temporary) Pain at injection site (several days) Decreased blood pressure (temporary) Weakness in arm/leg (temporary) Pressure sensation in back/neck (temporary)  Call if  you experience: Fever/chills associated with headache or increased back/neck pain. Headache worsened by an upright position. New onset weakness or numbness  of an extremity below the injection site Hives or difficulty breathing (go to the emergency room) Inflammation or drainage at the infection site Severe back/neck pain Any new symptoms which are concerning to you  Please note:  Although the local anesthetic injected can often make your back or neck feel good for several hours after the injection, the pain will likely return.  It takes 3-7 days for steroids to work in the epidural space.  You may not notice any pain relief for at least that one week.  If effective, we will often do a series of three injections spaced 3-6 weeks apart to maximally decrease your pain.  After the initial series, we generally will wait several months before considering a repeat injection of the same type.  If you have any questions, please call [phone] [facility]

## 2021-10-06 NOTE — Assessment & Plan Note (Signed)
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

## 2021-10-06 NOTE — Assessment & Plan Note (Signed)
Continue with physical therapy which patient is starting next week.  Defer medication management to primary care provider.  Patient is on high-dose gabapentin.  He has not responded to opioid trials in the past.  Is also on Cymbalta 60 mg.  We will focus on interventional therapies.

## 2021-10-06 NOTE — Assessment & Plan Note (Signed)
Bilateral SI joint and piriformis pain.  Consider diagnostic SI joint and piriformis injection.

## 2021-10-06 NOTE — Assessment & Plan Note (Signed)
Reviewed MRI with patient in great detail.  He has facet degeneration and hypertrophy at L4-L5 with stenosis of the lateral recess and foramina right greater than left.  He also has bilateral foraminal stenosis at L5 nerves at L5-S1.  He is status post bilateral L5-S1 transforaminal ESI with limited response.  We discussed trying a L4-L5 interlaminar injection to see if that provided better analgesic/functional benefit.  We also discussed spinal cord stimulation.

## 2021-10-14 NOTE — Unmapped (Signed)
Paul Velez reports his first Skyrizi injection went well. His psoriasis is improving and he's got no itching. He continues to use clobetasol PRN.     His week 4 dose is due on 6/28 then maintenance dosing starts on 01/18/22.     [facility] Clinical Assessment & Refill Coordination Note    Paul Velez, King: May 28, 1945  Phone: [phone] (home)     All above HIPAA information was verified with patient.     Was a Nurse, learning disability used for this call? No    Specialty Medication(s):   Inflammatory Disorders: Skyrizi     Current Outpatient Medications   Medication Sig Dispense Refill    albuterol sulfate 90 mcg/actuation AePB Inhale.      amLODIPine (NORVASC) 5 MG tablet Take 1 tablet (5 mg total) by mouth daily.      atorvastatin (LIPITOR) 20 MG tablet       celecoxib (CELEBREX) 200 MG capsule       clobetasoL (TEMOVATE) 0.05 % ointment Apply topically Two (2) times a day. Use on psoriasis until smooth. 60 g 1    DULoxetine (CYMBALTA) 60 MG capsule       edoxaban (SAVAYSA) 60 mg tablet Take 1 tablet (60 mg total) by mouth daily.      empagliflozin (JARDIANCE) 10 mg tablet Take 1 tablet (10 mg total) by mouth daily. 90 tablet 3    empty container Misc Use as directed to dispose of [employer]. 1 each 2    furosemide (LASIX) 80 MG tablet Take 1 tablet (80 mg total) by mouth daily.      gabapentin (NEURONTIN) 800 MG tablet Take 1 tablet (800 mg total) by mouth Three (3) times a day.      hydroCHLOROthiazide (HYDRODIURIL) 12.5 MG tablet       losartan (COZAAR) 50 MG tablet Take 1 tablet (50 mg total) by mouth daily.      metFORMIN (GLUCOPHAGE) 1000 MG tablet 500 mg in the am and 1000 mg in the evening      ONETOUCH VERIO TEST STRIPS Strp TEST TWICE DAILY      risankizumab-rzaa (SKYRIZI) 150 mg/mL PnIj Inject the contents of 1 pen (150mg ) under the skin at weeks 0 and 4 as loading doses. 2 mL 0    risankizumab-rzaa 150 mg/mL PnIj Inject the contents of 1 pen (150 mg total) under the skin Every three (3) months. 2 mL 1    semaglutide (OZEMPIC) 1 mg/dose (4 mg/3 mL) PnIj injection Inject under the skin every seven (7) days.      sotalol (BETAPACE) 80 MG tablet Take 1 tablet (80 mg total) by mouth Two (2) times a day.      tamsulosin (FLOMAX) 0.4 mg capsule Take 1 capsule (0.4 mg total) by mouth daily. 90 capsule 3    TRELEGY ELLIPTA 100-62.5-25 mcg inhaler       umeclidinium (INCRUSE ELLIPTA) 62.5 mcg/actuation inhaler Inhale 1 puff daily.       No current facility-administered medications for this visit.        Changes to medications: [name] reports no changes at this time.    Allergies   Allergen Reactions    Rosuvastatin Anaphylaxis     felt bad    Ace Inhibitors Anxiety    Lisinopril Other (See Comments)     Other reaction(s): Mental Status Change, Cough    Brinzolamide-Brimonidine Itching     And burning  And burning  Changes to allergies: No    SPECIALTY MEDICATION ADHERENCE     Skyrizi - 0 left  Medication Adherence    Patient reported X missed doses in the last month: 0  Specialty Medication: Skyrizi          Specialty medication(s) dose(s) confirmed: Regimen is correct and unchanged.     Are there any concerns with adherence? No    Adherence counseling provided? Not needed    CLINICAL MANAGEMENT AND INTERVENTION      Clinical Benefit Assessment:    Do you feel the medicine is effective or helping your condition? Yes    Clinical Benefit counseling provided? Not needed    Adverse Effects Assessment:    Are you experiencing any side effects? No    Are you experiencing difficulty administering your medicine? No    Quality of Life Assessment:    Quality of Life    Rheumatology  Oncology  Dermatology  1. What impact has your specialty medication had on the symptoms of your skin condition (i.e. itchiness, soreness, stinging)?: Some  2. What impact has your specialty medication had on your comfort level with your skin?: Some  Cystic Fibrosis          Have you discussed this with your provider? Not needed    Acute Infection Status:    Acute infections noted within Epic:  No active infections  Patient reported infection: None    Therapy Appropriateness:    Is therapy appropriate and patient progressing towards therapeutic goals? Yes, therapy is appropriate and should be continued    DISEASE/MEDICATION-SPECIFIC INFORMATION      For patients on injectable medications: Patient currently has 0 doses left.  Next injection is scheduled for Wed, 6/28 .    PATIENT SPECIFIC NEEDS     Does the patient have any physical, cognitive, or cultural barriers? No    Is the patient high risk? No    Does the patient require a Care Management Plan? No       SHIPPING     Specialty Medication(s) to be Shipped:   Inflammatory Disorders: Skyrizi    Other medication(s) to be shipped: No additional medications requested for fill at this time     Changes to insurance: No    Delivery Scheduled: Yes, Expected medication delivery date: Thurs, 6/22.     Medication will be delivered via Same Day Courier to the confirmed prescription address in [facility]    The patient will receive a drug information handout for each medication shipped and additional FDA Medication Guides as required.  Verified that patient has previously received a Conservation officer, historic buildings and a Surveyor, mining.    The patient or caregiver noted above participated in the development of this care plan and knows that they can request review of or adjustments to the care plan at any time.      All of the patient's questions and concerns have been addressed.    [name]   [facility] Specialty Pharmacist

## 2021-10-17 DIAGNOSIS — Z9189 Other specified personal risk factors, not elsewhere classified: Principal | ICD-10-CM

## 2021-10-17 DIAGNOSIS — E1169 Type 2 diabetes mellitus with other specified complication: Principal | ICD-10-CM

## 2021-10-17 NOTE — Unmapped (Signed)
I can try to get them into the [facility] health.

## 2021-10-17 NOTE — Unmapped (Signed)
Patient is requesting to change the podiatry referral to somewhere closer to [facility]. Please advise, thank you!

## 2021-10-17 NOTE — Unmapped (Signed)
Referral has been sent and message left for patient to notify him of this.

## 2021-10-19 NOTE — Unmapped (Signed)
[facility]  Established Patient Clinic Note    Assessment/Plan:   Problem List Items Addressed This Visit          Musculoskeletal and Integument    OA (osteoarthritis) of knee     Knee Aspiration/Injection bilateral    Allergies:  reviewed allergy section in the chart    Consent:  After discussing the various treatment options for the condition, It was agreed that a corticosteroid injection would be the next step in treatment. The nature of and the indications for a corticosteroid and /or local anaesthetic injection were reviewed in detail with the patient today. The inherent risks of injection including infection, bleeding, allergic reaction, increased pain, incomplete relief or temporary relief of symptoms, alterations of blood glucose levels requiring careful monitoring and treatment as indicated, tendon, ligament or articular cartilage rupture or degeneration, nerve injury, skin depigmentation, and/or fatty atrophy were discussed.     Time-out:  Performed immediately prior to procedure      Anesthesia: not required    Procedure Details: After the risks and benefits of the procedure were explained,verbal consent was given, and a procedural time-out was performed. The knee joint was palpated and the correct anatomical landmarks were were used to identify the knee joint. The site for the injection was properly marked and prepped with alcohol solution.  The injection site was anesthetized with ethyl chloride. The knee joint was injected with 40 milligrams of depo medrol using a sterile technique and a 22 gauge 1.5 inch needle. During the injection, there was unrestricted flow and care was taken not to inject corticosteroid into the skin or subcutaneous tissues. There were no complications during the procedure.    EBL: <5 mL    Condition: Stable    Complications:  None    Post-operative Instructions and Plan: Post procedure a sterile band-aide was applied. Post-injection instructions were given regarding post-procedure care, when to follow up in clinic and what to expect from the procedure. The patient tolerated the injection well and was discharged without complication.    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   - Will trial 100mg  sildenafil and work with urology for procedural options after.             HEALTH MAINTENANCE ITEMS STILL DUE:  Health Maintenance Due   Topic Date Due    COPD Spirometry  Never done    Retinal Eye Exam  Never done    Zoster Vaccines (1 of 2) Never done    DTaP/Tdap/Td Vaccines (1 - Tdap) 10/31/2006    AAA Screening  Never done       Subjective   Paul Velez is a 76 y.o. male  coming to clinic today for the following issues:    Chief Complaint   Patient presents with    knee injection       HPI    Started rehab. Pending epidural injeciton later this week. Goal to be walking more.  Needs StairMaster for home    ED has been problem for years. Since wife passed. Tried medications. Needed shot before. Urologist and him have not talked about this.     Paul Velez is a 76 y.o. male, with the following medical problems as documented above:      ROS    I have reviewed the problem list, medications, and allergies and have updated/reconciled them if needed.    Paul Velez  reports that he quit smoking about 52 years ago. His smoking use included cigarettes. He smoked an average of .5 packs per day. He has never used smokeless tobacco.    Objective     VITALS: BP 110/72 (BP Site: L Arm, BP Position: Sitting, BP Cuff Size: Large)  - Pulse 77  - Temp 36.7 ??C (98.1 ??F) (Oral)  - Resp 20  - SpO2 92% Comment: on oxygen    Wt Readings from Last 6 Encounters:   09/22/21 (!) 108 kg (238 lb)   09/14/21 (!) 107.5 kg (237 lb)   06/24/21 (!) 107.6 kg (237 lb 3.2 oz)        Physical Exam  Vitals reviewed.   Constitutional:       General: He is not in acute distress.     Appearance: Normal appearance. He is well-developed. He is not diaphoretic.   HENT:      Head: Normocephalic and atraumatic.      Right Ear: Hearing normal.      Left Ear: Hearing normal.      Nose: Nose normal.   Eyes:      General: No scleral icterus.        Right eye: No discharge.         Left eye: No discharge.      Conjunctiva/sclera: Conjunctivae normal.      Right eye: Right conjunctiva is not injected.      Left eye: Left conjunctiva is not injected.   Neck:      Thyroid: No thyroid mass.      Vascular: No carotid bruit or JVD.      Trachea: Trachea and phonation normal.   Cardiovascular:      Rate and Rhythm: Normal rate and regular rhythm. No extrasystoles are present.     Pulses: Normal pulses.           Radial pulses are 2+ on the right side and 2+ on the left side.      Heart sounds: Murmur heard.   Pulmonary:      Effort: Pulmonary effort is normal. No respiratory distress.      Breath sounds: Wheezing present. No rales.      Comments: Oxygen 2L [place]    Musculoskeletal:         General: No deformity. Normal range of motion.      Cervical back: Normal range of motion.      Right lower leg: No edema.      Left lower leg: No edema.   Skin:     General: Skin is warm and dry.      Capillary Refill: Capillary refill takes less than 2 seconds.      Findings: Lesion and rash present. No bruising or erythema.      Nails: There is no clubbing.   Neurological:      Mental Status: He is alert and oriented to person, place, and time.      Cranial Nerves: No cranial nerve deficit.  Motor: Weakness present.      Gait: Gait normal.   Psychiatric: Mood and Affect: Mood normal. Mood is not anxious.         Speech: Speech normal.         Behavior: Behavior normal.         Thought Content: Thought content normal.         Judgment: Judgment normal.       LABS/IMAGING  I have reviewed pertinent recent labs and imaging in Epic    Veatrice Kells, MD  [facility]  [facility]   [address] [place], [place] [address]  Telephone [phone]  Fax [phone]

## 2021-10-20 DIAGNOSIS — L400 Psoriasis vulgaris: Principal | ICD-10-CM

## 2021-10-20 MED FILL — SKYRIZI 150 MG/ML SUBCUTANEOUS PEN INJECTOR: 84 days supply | Qty: 1 | Fill #1

## 2021-10-20 NOTE — Therapy (Incomplete)
OUTPATIENT PHYSICAL THERAPY THORACOLUMBAR EVALUATION   Patient Name: Mario [name] MRN: [id number] DOB:[birth year], 76 y.o., male Today's Date: 10/20/2021    Past Medical History:  Diagnosis Date   Arthritis    hands, knees   Atrial fibrillation (HCC)    CHF (congestive heart failure) (HCC)    COPD (chronic obstructive pulmonary disease) ([place])    Diabetes mellitus without complication (HCC)    Diabetic neuropathy ([place])    Heart murmur    in past, mild 1969   Hypertension    resolved after weight loss and more activity   Neuromuscular disorder ([place])    neuropathy   Neuropathy    Pulmonary embolism ([place])    approx 2014   Shingles '99   Sleep apnea    CPAP   Wears dentures    full upper and lower   Past Surgical History:  Procedure Laterality Date   CATARACT EXTRACTION W/PHACO Left 05/12/2019   Procedure: CATARACT EXTRACTION PHACO AND INTRAOCULAR LENS PLACEMENT ([place]) LEFTDIABETIC ISTENT INJ 2.43  00:28.6;  Surgeon: Eulogio Bear, MD;  Location: [place];  Service: Ophthalmology;  Laterality: Left;  Diabetic - oral meds sleep apnea   CATARACT EXTRACTION W/PHACO Right 06/02/2019   Procedure: CATARACT EXTRACTION PHACO AND INTRAOCULAR LENS PLACEMENT (IOC) ISTENT INJ  RIGHT DIABETIC 1.31  00:25.4;  Surgeon: Eulogio Bear, MD;  Location: [place];  Service: Ophthalmology;  Laterality: Right;   COLONOSCOPY  08/30/2015   FINGER SURGERY Right    3'rd finger, right hand   KNEE SURGERY Right    screw in knee   WRIST SURGERY Left    metal removed   Patient Active Problem List   Diagnosis Date Noted   Spinal stenosis, lumbar region, with neurogenic claudication 10/06/2021   Sacroiliac joint pain 10/06/2021   Piriformis muscle pain 10/06/2021   Bilateral primary osteoarthritis of knee 06/11/2019   Chronic radicular lumbar pain 06/11/2019   Cervical facet joint syndrome 06/11/2019   Lumbar facet arthropathy 06/11/2019   Lumbar radiculopathy  06/11/2019   Chronic pain syndrome 06/11/2019   Lung nodule 02/09/2016   Exercise hypoxemia 12/28/2015   Chronic obstructive pulmonary disease ([place]) 03/16/2015   Nuclear sclerosis 03/16/2015   OSA (obstructive sleep apnea) 03/16/2015   Primary open angle glaucoma of both eyes, severe stage 03/16/2015   Type 2 diabetes mellitus without complication, without long-term current use of insulin ([place]) 03/16/2015   Arthralgia of left lower leg 10/29/2014   Osteoarthritis of left knee 09/10/2014   Type 2 diabetes mellitus with diabetic neuropathy ([place]) 08/04/2014   Cataract, nuclear sclerotic senile 04/01/2014   Combined forms of age-related cataract 04/01/2014   Keratitis sicca, bilateral ([place]) 04/01/2014   Primary open angle glaucoma 02/27/2014   Flat feet 01/29/2014   Foot pain, bilateral 01/29/2014   Tinea pedis of both feet 01/29/2014   Ptosis of both eyelids 11/25/2013   Type 2 diabetes mellitus without retinopathy ([place]) 11/25/2013   Onychomycosis [id number]   Chronic systolic heart failure ([place]) 11/11/2013   Emphysema of lung ([place]) 11/11/2013   Essential hypertension 11/11/2013   Screen for colon cancer 11/05/2013   Cardiomyopathy ([place]) [id number]   Systolic HF (heart failure) ([place]) [id number]   Acute systolic CHF (congestive heart failure) ([place]) 09/16/2013   COPD (chronic obstructive pulmonary disease) ([place]) 09/16/2013   HTN (hypertension) 09/16/2013   Obesity 09/16/2013   PAF (paroxysmal atrial fibrillation) ([place]) 09/16/2013   Senile nuclear sclerosis 05/22/2013   Muscle weakness (generalized) 11/11/2009   Long term  current use of anticoagulant therapy 06/17/2009   Pulmonary embolism (HCC) 06/17/2009   ED (erectile dysfunction) of organic origin 05/13/2003    PCP: Maree Erie MD  REFERRING PROVIDER: Filomena Jungling I MD   REFERRING DIAG: back and LE pain   Rationale for Evaluation and Treatment Rehabilitation  THERAPY DIAG:  No diagnosis found.  ONSET DATE:  ***  SUBJECTIVE:                                                                                                                                                                                           SUBJECTIVE STATEMENT: *** PERTINENT HISTORY:  PMH includes: arthritis, a fib, CHF, COPD, DM, neuropathy, heart murmur, HTN, PE, shingles, spinal stenosis, SI pain, bilateral OA of knees, lumbar facet arthropathy, lumbar radiculopathy, chronic pain syndrome, lung nodule, exercise hypoxemia, glaucoma of bilateral eyes, flat feet, onychomycosis, PAF. Per patient notes patient was recommended surgery by orthopedics but patient declined. Since patient was seen last in this clinic he has been in the ED three times.   PAIN:  Are you having pain? Yes: NPRS scale: ***/10 Pain location: *** Pain description: *** Aggravating factors: *** Relieving factors: ***   PRECAUTIONS: {Therapy precautions:24002}  WEIGHT BEARING RESTRICTIONS {Yes ***/No:24003}  FALLS:  Has patient fallen in last 6 months? {fallsyesno:27318}  LIVING ENVIRONMENT: Lives with: {OPRC lives with:25569::"lives with their family"} Lives in: {Lives in:25570} Stairs: {opstairs:27293} Has following equipment at home: {Assistive devices:23999}  OCCUPATION: ***  PLOF: {PLOF:24004}  PATIENT GOALS ***   OBJECTIVE:   DIAGNOSTIC FINDINGS:  MRI 08/27/21: L3-4: Mild chronic degenerative changes without visible neural compression or progression.   L4-5: Disc bulge. Facet degeneration and hypertrophy. Stenosis of the lateral recesses and foramina could possibly cause neural compression. Foraminal stenosis slightly worse on the right. Slight worsening since 2019. Small synovial cyst previously seen is no longer visible.   L5-S1: Disc and facet degeneration. Bilateral foraminal stenosis could compress either or both L5 nerves. Similar appearance to the prior study.  PATIENT SURVEYS:  {rehab surveys:24030}  SCREENING FOR  RED FLAGS: Bowel or bladder incontinence: {Yes/No:304960894} Spinal tumors: {Yes/No:304960894} Cauda equina syndrome: {Yes/No:304960894} Compression fracture: {Yes/No:304960894} Abdominal aneurysm: {Yes/No:304960894}  COGNITION:  Overall cognitive status: {cognition:24006}     SENSATION: {sensation:27233}  MUSCLE LENGTH: Hamstrings: Right *** deg; Left *** deg Thomas test: Right *** deg; Left *** deg  POSTURE: {posture:25561}  PALPATION: ***  LUMBAR ROM:   {AROM/PROM:27142}  A/PROM  eval  Flexion   Extension   Right lateral flexion   Left lateral flexion   Right rotation   Left rotation    (Blank rows = not  tested)  LOWER EXTREMITY ROM:     {AROM/PROM:27142}  Right eval Left eval  Hip flexion    Hip extension    Hip abduction    Hip adduction    Hip internal rotation    Hip external rotation    Knee flexion    Knee extension    Ankle dorsiflexion    Ankle plantarflexion    Ankle inversion    Ankle eversion     (Blank rows = not tested)  LOWER EXTREMITY MMT:    MMT Right eval Left eval  Hip flexion    Hip extension    Hip abduction    Hip adduction    Hip internal rotation    Hip external rotation    Knee flexion    Knee extension    Ankle dorsiflexion    Ankle plantarflexion    Ankle inversion    Ankle eversion     (Blank rows = not tested)  LUMBAR SPECIAL TESTS:  {lumbar special test:25242}  FUNCTIONAL TESTS:  {Functional tests:24029}  GAIT: Distance walked: *** Assistive device utilized: {Assistive devices:23999} Level of assistance: {Levels of assistance:24026} Comments: ***    TODAY'S TREATMENT  ***   PATIENT EDUCATION:  Education details: *** Person educated: {Person educated:25204} Education method: {Education Method:25205} Education comprehension: {Education Comprehension:25206}   HOME EXERCISE PROGRAM: ***  ASSESSMENT:  CLINICAL IMPRESSION: Patient is a *** y.o. *** who was seen today for physical therapy  evaluation and treatment for ***.    OBJECTIVE IMPAIRMENTS {opptimpairments:25111}.   ACTIVITY LIMITATIONS {activitylimitations:27494}  PARTICIPATION LIMITATIONS: {participationrestrictions:25113}  PERSONAL FACTORS {Personal factors:25162} are also affecting patient's functional outcome.   REHAB POTENTIAL: {rehabpotential:25112}  CLINICAL DECISION MAKING: {clinical decision making:25114}  EVALUATION COMPLEXITY: {Evaluation complexity:25115}   GOALS: Goals reviewed with patient? {yes/no:20286}  SHORT TERM GOALS: Target date: {follow up:25551}  *** Baseline: Goal status: {GOALSTATUS:25110}  2.  *** Baseline:  Goal status: {GOALSTATUS:25110}  3.  *** Baseline:  Goal status: {GOALSTATUS:25110}  4.  *** Baseline:  Goal status: {GOALSTATUS:25110}  5.  *** Baseline:  Goal status: {GOALSTATUS:25110}  6.  *** Baseline:  Goal status: {GOALSTATUS:25110}  LONG TERM GOALS: Target date: {follow up:25551}  *** Baseline:  Goal status: {GOALSTATUS:25110}  2.  *** Baseline:  Goal status: {GOALSTATUS:25110}  3.  *** Baseline:  Goal status: {GOALSTATUS:25110}  4.  *** Baseline:  Goal status: {GOALSTATUS:25110}  5.  *** Baseline:  Goal status: {GOALSTATUS:25110}  6.  *** Baseline:  Goal status: {GOALSTATUS:25110}   PLAN: PT FREQUENCY: {rehab frequency:25116}  PT DURATION: {rehab duration:25117}  PLANNED INTERVENTIONS: {rehab planned interventions:25118::"Therapeutic exercises","Therapeutic activity","Neuromuscular re-education","Balance training","Gait training","Patient/Family education","Joint mobilization"}.  PLAN FOR NEXT SESSION: ***   Precious Bard, PT 10/20/2021, 1:28 PM

## 2021-10-21 ENCOUNTER — Ambulatory Visit (INDEPENDENT_AMBULATORY_CARE_PROVIDER_SITE_OTHER): Payer: 59

## 2021-10-21 ENCOUNTER — Ambulatory Visit (INDEPENDENT_AMBULATORY_CARE_PROVIDER_SITE_OTHER): Payer: 59 | Admitting: Podiatry

## 2021-10-21 DIAGNOSIS — M7741 Metatarsalgia, right foot: Secondary | ICD-10-CM

## 2021-10-21 DIAGNOSIS — B351 Tinea unguium: Secondary | ICD-10-CM

## 2021-10-21 DIAGNOSIS — M79674 Pain in right toe(s): Secondary | ICD-10-CM

## 2021-10-21 DIAGNOSIS — M2141 Flat foot [pes planus] (acquired), right foot: Secondary | ICD-10-CM

## 2021-10-21 DIAGNOSIS — M79675 Pain in left toe(s): Secondary | ICD-10-CM

## 2021-10-21 DIAGNOSIS — M2142 Flat foot [pes planus] (acquired), left foot: Secondary | ICD-10-CM

## 2021-10-21 DIAGNOSIS — E0843 Diabetes mellitus due to underlying condition with diabetic autonomic (poly)neuropathy: Secondary | ICD-10-CM | POA: Diagnosis not present

## 2021-10-21 DIAGNOSIS — M7742 Metatarsalgia, left foot: Secondary | ICD-10-CM

## 2021-10-24 ENCOUNTER — Ambulatory Visit: Admit: 2021-10-24 | Discharge: 2021-10-25 | Payer: MEDICARE

## 2021-10-24 ENCOUNTER — Ambulatory Visit: Payer: 59 | Attending: Physical Medicine & Rehabilitation

## 2021-10-24 DIAGNOSIS — M170 Bilateral primary osteoarthritis of knee: Principal | ICD-10-CM

## 2021-10-24 DIAGNOSIS — N529 Male erectile dysfunction, unspecified: Principal | ICD-10-CM

## 2021-10-24 DIAGNOSIS — R269 Unspecified abnormalities of gait and mobility: Secondary | ICD-10-CM | POA: Insufficient documentation

## 2021-10-24 DIAGNOSIS — M5459 Other low back pain: Secondary | ICD-10-CM | POA: Diagnosis present

## 2021-10-24 DIAGNOSIS — M6281 Muscle weakness (generalized): Secondary | ICD-10-CM | POA: Insufficient documentation

## 2021-10-24 DIAGNOSIS — R262 Difficulty in walking, not elsewhere classified: Secondary | ICD-10-CM | POA: Diagnosis present

## 2021-10-24 MED ORDER — SILDENAFIL 100 MG TABLET
ORAL_TABLET | Freq: Every day | ORAL | 0 refills | 5 days | Status: CP | PRN
Start: 2021-10-24 — End: 2021-11-23

## 2021-10-24 MED ADMIN — methylPREDNISolone acetate (DEPO-MEDROL) injection 40 mg: 40 mg | INTRAMUSCULAR | @ 16:00:00 | Stop: 2021-10-24

## 2021-10-24 NOTE — Unmapped (Signed)
Knee Aspiration/Injection bilateral    Allergies:  reviewed allergy section in the chart    Consent:  After discussing the various treatment options for the condition, It was agreed that a corticosteroid injection would be the next step in treatment. The nature of and the indications for a corticosteroid and /or local anaesthetic injection were reviewed in detail with the patient today. The inherent risks of injection including infection, bleeding, allergic reaction, increased pain, incomplete relief or temporary relief of symptoms, alterations of blood glucose levels requiring careful monitoring and treatment as indicated, tendon, ligament or articular cartilage rupture or degeneration, nerve injury, skin depigmentation, and/or fatty atrophy were discussed.     Time-out:  Performed immediately prior to procedure      Anesthesia: not required    Procedure Details: After the risks and benefits of the procedure were explained,verbal consent was given, and a procedural time-out was performed. The knee joint was palpated and the correct anatomical landmarks were were used to identify the knee joint. The site for the injection was properly marked and prepped with alcohol solution.  The injection site was anesthetized with ethyl chloride. The knee joint was injected with 40 milligrams of depo medrol using a sterile technique and a 22 gauge 1.5 inch needle. During the injection, there was unrestricted flow and care was taken not to inject corticosteroid into the skin or subcutaneous tissues. There were no complications during the procedure.    EBL: <5 mL    Condition: Stable    Complications:  None    Post-operative Instructions and Plan: Post procedure a sterile band-aide was applied. Post-injection instructions were given regarding post-procedure care, when to follow up in clinic and what to expect from the procedure. The patient tolerated the injection well and was discharged without complication.    CPT: 20605

## 2021-10-24 NOTE — Unmapped (Signed)
//   Erectile Dysfunction: Patient states that symptoms of difficulty achieving an erection and difficulty maintaining an erection have been unchanged recently   - Hx of failing sildenafil and tadalafil- but unsure dose.   - Will trial 100mg  sildenafil and work with urology for procedural options after.

## 2021-10-26 ENCOUNTER — Other Ambulatory Visit: Payer: Self-pay

## 2021-10-26 ENCOUNTER — Other Ambulatory Visit: Payer: Self-pay | Admitting: Student in an Organized Health Care Education/Training Program

## 2021-10-26 ENCOUNTER — Ambulatory Visit (HOSPITAL_BASED_OUTPATIENT_CLINIC_OR_DEPARTMENT_OTHER): Payer: 59 | Admitting: Student in an Organized Health Care Education/Training Program

## 2021-10-26 ENCOUNTER — Ambulatory Visit
Admission: RE | Admit: 2021-10-26 | Discharge: 2021-10-26 | Disposition: A | Discharge status: Home / Self Care | Payer: 59 | Source: Ambulatory Visit | Attending: Student in an Organized Health Care Education/Training Program | Admitting: Student in an Organized Health Care Education/Training Program

## 2021-10-26 ENCOUNTER — Encounter: Payer: Self-pay | Admitting: Student in an Organized Health Care Education/Training Program

## 2021-10-26 DIAGNOSIS — G8929 Other chronic pain: Secondary | ICD-10-CM | POA: Insufficient documentation

## 2021-10-26 DIAGNOSIS — G894 Chronic pain syndrome: Secondary | ICD-10-CM

## 2021-10-26 DIAGNOSIS — M5416 Radiculopathy, lumbar region: Secondary | ICD-10-CM | POA: Insufficient documentation

## 2021-10-26 DIAGNOSIS — R52 Pain, unspecified: Secondary | ICD-10-CM | POA: Diagnosis present

## 2021-10-26 DIAGNOSIS — M48062 Spinal stenosis, lumbar region with neurogenic claudication: Secondary | ICD-10-CM | POA: Insufficient documentation

## 2021-10-26 MED ORDER — IOHEXOL 180 MG/ML  SOLN
10.0000 mL | Freq: Once | INTRAMUSCULAR | Status: AC
  Administered 2021-10-26: 10 mL via EPIDURAL

## 2021-10-26 MED ORDER — ROPIVACAINE HCL 2 MG/ML IJ SOLN
INTRAMUSCULAR | Status: AC
  Filled 2021-10-26: qty 20

## 2021-10-26 MED ORDER — ROPIVACAINE HCL 2 MG/ML IJ SOLN
2.0000 mL | Freq: Once | INTRAMUSCULAR | Status: AC
Start: 2021-10-26 — End: 2021-10-26
  Administered 2021-10-26: 2 mL via EPIDURAL

## 2021-10-26 MED ORDER — LIDOCAINE HCL 2 % IJ SOLN
20.0000 mL | Freq: Once | INTRAMUSCULAR | Status: AC
Start: 2021-10-26 — End: 2021-10-26
  Administered 2021-10-26: 400 mg

## 2021-10-26 MED ORDER — DEXAMETHASONE SODIUM PHOSPHATE 10 MG/ML IJ SOLN
INTRAMUSCULAR | Status: AC
  Filled 2021-10-26: qty 1

## 2021-10-26 MED ORDER — SODIUM CHLORIDE 0.9% FLUSH
2.0000 mL | Freq: Once | INTRAVENOUS | Status: AC
Start: 2021-10-26 — End: 2021-10-26
  Administered 2021-10-26: 2 mL

## 2021-10-26 MED ORDER — SODIUM CHLORIDE (PF) 0.9 % IJ SOLN
INTRAMUSCULAR | Status: AC
  Filled 2021-10-26: qty 10

## 2021-10-26 MED ORDER — LIDOCAINE HCL 2 % IJ SOLN
INTRAMUSCULAR | Status: AC
  Filled 2021-10-26: qty 20

## 2021-10-26 MED ORDER — DEXAMETHASONE SODIUM PHOSPHATE 10 MG/ML IJ SOLN
10.0000 mg | Freq: Once | INTRAMUSCULAR | Status: AC
Start: 2021-10-26 — End: 2021-10-26
  Administered 2021-10-26: 10 mg

## 2021-10-26 NOTE — Patient Instructions (Signed)

## 2021-10-26 NOTE — Progress Notes (Signed)
PROVIDER NOTE: Interpretation of information contained herein should be left to medically-trained personnel. Specific patient instructions are provided elsewhere under "Patient Instructions" section of medical record. This document was created in part using STT-dictation technology, any transcriptional errors that may result from this process are unintentional.  Patient: Mario [name] Type: Established DOB: [birth year] MRN: [id number] PCP: Mario Erie, MD  Service: Procedure DOS: 10/26/2021 Setting: Ambulatory Location: Ambulatory outpatient facility Delivery: Face-to-face Provider: Edward Jolly, MD Specialty: Interventional Pain Management Specialty designation: 09 Location: Outpatient facility Ref. Prov.: Mario Jolly, MD    Primary Reason for Visit: Interventional Pain Management Treatment. CC: Back Pain (Lumbar more on the right going into buttocks  and leg )   Procedure:           Type: Lumbar epidural steroid injection (LESI) (interlaminar) #1    Laterality: Right   Level:  L4-5 Level.  Imaging: Fluoroscopic guidance Anesthesia: Local anesthesia (1-2% Lidocaine) Anxiolysis: None                 Sedation: None. DOS: 10/26/2021  Performed by: Mario Jolly, MD  Purpose: Diagnostic/Therapeutic Indications: Lumbar radicular pain of intraspinal etiology of more than 4 weeks that has failed to respond to conservative therapy and is severe enough to impact quality of life or function. 1. Spinal stenosis, lumbar region, with neurogenic claudication   2. Chronic radicular lumbar pain   3. Chronic pain syndrome    NAS-11 Pain score:   Pre-procedure: 6 /10   Post-procedure: 0-No pain/10      Position / Prep / Materials:  Position: Prone w/ head of the table raised (slight reverse trendelenburg) to facilitate breathing.  Prep solution: DuraPrep (Iodine Povacrylex [0.7% available iodine] and Isopropyl Alcohol, 74% w/w) Prep Area: Entire Posterior Lumbar Region from lower  scapular tip down to mid buttocks area and from flank to flank. Materials:  Tray: Epidural tray Needle(s):  Type: Epidural needle (Tuohy) Gauge (G):  17 Length: Long (20cm) Qty: 1  Pre-op H&P Assessment:  [name] is a 76 y.o. (year old), male patient, seen today for interventional treatment. He  has a past surgical history that includes Wrist surgery (Left); Knee surgery (Right); Colonoscopy (08/30/2015); Cataract extraction w/PHACO (Left, 05/12/2019); Finger surgery (Right); and Cataract extraction w/PHACO (Right, 06/02/2019). [name] has a current medication list which includes the following prescription(s): albuterol, apple cider vinegar, celecoxib, duloxetine, edoxaban, empagliflozin, fluticasone-umeclidin-vilant, furosemide, gabapentin, glucose blood, hydrochlorothiazide, ipratropium-albuterol, metformin, ozempic (1 mg/dose), skyrizi, rocklatan, sotalol af, turmeric, gabapentin, oxycodone-acetaminophen, and [DISCONTINUED] losartan. His primarily concern today is the Back Pain (Lumbar more on the right going into buttocks  and leg )  Initial Vital Signs:  Pulse/HCG Rate: 77  Temp:  (!) 97.4 F (36.3 C) Resp: 16 BP: 127/79 SpO2: 94 % (2 liters)  BMI: Estimated body mass index is 33.58 kg/m as calculated from the following:   Height as of this encounter: 5\' 10"  (1.778 m).   Weight as of this encounter: 234 lb (106.1 kg).  Risk Assessment: Allergies: Reviewed. He is allergic to rosuvastatin, ace inhibitors, lisinopril, and simbrinza [brinzolamide-brimonidine].  Allergy Precautions: None required Coagulopathies: Reviewed. None identified.  Blood-thinner therapy: None at this time Active Infection(s): Reviewed. None identified. [name] is afebrile  Site Confirmation: [name] was asked to confirm the procedure and laterality before marking the site Procedure checklist: Completed Consent: Before the procedure and under the influence of no sedative(s), amnesic(s), or anxiolytics, the  patient was informed of the treatment options, risks and possible complications.  To fulfill our ethical and legal obligations, as recommended by the American Medical Association's Code of Ethics, I have informed the patient of my clinical impression; the nature and purpose of the treatment or procedure; the risks, benefits, and possible complications of the intervention; the alternatives, including doing nothing; the risk(s) and benefit(s) of the alternative treatment(s) or procedure(s); and the risk(s) and benefit(s) of doing nothing. The patient was provided information about the general risks and possible complications associated with the procedure. These may include, but are not limited to: failure to achieve desired goals, infection, bleeding, organ or nerve damage, allergic reactions, paralysis, and death. In addition, the patient was informed of those risks and complications associated to Spine-related procedures, such as failure to decrease pain; infection (i.e.: Meningitis, epidural or intraspinal abscess); bleeding (i.e.: epidural hematoma, subarachnoid hemorrhage, or any other type of intraspinal or peri-dural bleeding); organ or nerve damage (i.e.: Any type of peripheral nerve, nerve root, or spinal cord injury) with subsequent damage to sensory, motor, and/or autonomic systems, resulting in permanent pain, numbness, and/or weakness of one or several areas of the body; allergic reactions; (i.e.: anaphylactic reaction); and/or death. Furthermore, the patient was informed of those risks and complications associated with the medications. These include, but are not limited to: allergic reactions (i.e.: anaphylactic or anaphylactoid reaction(s)); adrenal axis suppression; blood sugar elevation that in diabetics may result in ketoacidosis or comma; water retention that in patients with history of congestive heart failure may result in shortness of breath, pulmonary edema, and decompensation with resultant  heart failure; weight gain; swelling or edema; medication-induced neural toxicity; particulate matter embolism and blood vessel occlusion with resultant organ, and/or nervous system infarction; and/or aseptic necrosis of one or more joints. Finally, the patient was informed that Medicine is not an exact science; therefore, there is also the possibility of unforeseen or unpredictable risks and/or possible complications that may result in a catastrophic outcome. The patient indicated having understood very clearly. We have given the patient no guarantees and we have made no promises. Enough time was given to the patient to ask questions, all of which were answered to the patient's satisfaction. Mr. Veras has indicated that he wanted to continue with the procedure. Attestation: I, the ordering provider, attest that I have discussed with the patient the benefits, risks, side-effects, alternatives, likelihood of achieving goals, and potential problems during recovery for the procedure that I have provided informed consent. Date  Time: 10/26/2021 10:40 AM  Pre-Procedure Preparation:  Monitoring: As per clinic protocol. Respiration, ETCO2, SpO2, BP, heart rate and rhythm monitor placed and checked for adequate function Safety Precautions: Patient was assessed for positional comfort and pressure points before starting the procedure. Time-out: I initiated and conducted the "Time-out" before starting the procedure, as per protocol. The patient was asked to participate by confirming the accuracy of the "Time Out" information. Verification of the correct person, site, and procedure were performed and confirmed by me, the nursing staff, and the patient. "Time-out" conducted as per Joint Commission's Universal Protocol (UP.01.01.01). Time: 1117  Description/Narrative of Procedure:          Target: Epidural space via interlaminar opening, initially targeting the lower laminar border of the superior vertebral  body. Region: Lumbar Approach: Percutaneous paravertebral  Rationale (medical necessity): procedure needed and proper for the diagnosis and/or treatment of the patient's medical symptoms and needs. Procedural Technique Safety Precautions: Aspiration looking for blood return was conducted prior to all injections. At no point did we inject any substances,  as a needle was being advanced. No attempts were made at seeking any paresthesias. Safe injection practices and needle disposal techniques used. Medications properly checked for expiration dates. SDV (single dose vial) medications used. Description of the Procedure: Protocol guidelines were followed. The procedure needle was introduced through the skin, ipsilateral to the reported pain, and advanced to the target area. Bone was contacted and the needle walked caudad, until the lamina was cleared. The epidural space was identified using "loss-of-resistance technique" with 2-3 ml of PF-NaCl (0.9% NSS), in a 5cc LOR glass syringe.  Vitals:   10/26/21 1049 10/26/21 1112 10/26/21 1124  BP: 127/79 110/81 112/82  Pulse: 77 73 70  Resp: 16 17 16   Temp: (!) 97.4 F (36.3 C)    TempSrc: Temporal    SpO2: 94% 100% 100%  Weight: 234 lb (106.1 kg)    Height: 5\' 10"  (1.778 m)      Start Time: 1117 hrs. End Time: 1121 hrs.  Imaging Guidance (Spinal):          Type of Imaging Technique: Fluoroscopy Guidance (Spinal) Indication(s): Assistance in needle guidance and placement for procedures requiring needle placement in or near specific anatomical locations not easily accessible without such assistance. Exposure Time: Please see nurses notes. Contrast: Before injecting any contrast, we confirmed that the patient did not have an allergy to iodine, shellfish, or radiological contrast. Once satisfactory needle placement was completed at the desired level, radiological contrast was injected. Contrast injected under live fluoroscopy. No contrast complications.  See chart for type and volume of contrast used. Fluoroscopic Guidance: I was personally present during the use of fluoroscopy. "Tunnel Vision Technique" used to obtain the best possible view of the target area. Parallax error corrected before commencing the procedure. "Direction-depth-direction" technique used to introduce the needle under continuous pulsed fluoroscopy. Once target was reached, antero-posterior, oblique, and lateral fluoroscopic projection used confirm needle placement in all planes. Images permanently stored in EMR. Interpretation: I personally interpreted the imaging intraoperatively. Adequate needle placement confirmed in multiple planes. Appropriate spread of contrast into desired area was observed. No evidence of afferent or efferent intravascular uptake. No intrathecal or subarachnoid spread observed. Permanent images saved into the patient's record.  Antibiotic Prophylaxis:   Anti-infectives (From admission, onward)    None      Indication(s): None identified  Post-operative Assessment:  Post-procedure Vital Signs:  Pulse/HCG Rate: 70 (nsr)  Temp:  (!) 97.4 F (36.3 C) Resp: 16 BP: 112/82 SpO2: 100 %  EBL: None  Complications: No immediate post-treatment complications observed by team, or reported by patient.  Note: The patient tolerated the entire procedure well. A repeat set of vitals were taken after the procedure and the patient was kept under observation following institutional policy, for this type of procedure. Post-procedural neurological assessment was performed, showing return to baseline, prior to discharge. The patient was provided with post-procedure discharge instructions, including a section on how to identify potential problems. Should any problems arise concerning this procedure, the patient was given instructions to immediately contact us, at any time, without hesitation. In any case, we plan to contact the patient by telephone for a follow-up  status report regarding this interventional procedure.  Comments:  No additional relevant information.  Plan of Care  Orders:  No orders of the defined types were placed in this encounter.   Medications ordered for procedure: Meds ordered this encounter  Medications   iohexol (OMNIPAQUE) 180 MG/ML injection 10 mL    Must be Myelogram-compatible. If  not available, you may substitute with a water-soluble, non-ionic, hypoallergenic, myelogram-compatible radiological contrast medium.   lidocaine (XYLOCAINE) 2 % (with pres) injection 400 mg   sodium chloride flush (NS) 0.9 % injection 2 mL   ropivacaine (PF) 2 mg/mL (0.2%) (NAROPIN) injection 2 mL   dexamethasone (DECADRON) injection 10 mg   Medications administered: We administered iohexol, lidocaine, sodium chloride flush, ropivacaine (PF) 2 mg/mL (0.2%), and dexamethasone.  See the medical record for exact dosing, route, and time of administration.  Follow-up plan:   Return in about 4 weeks (around 11/23/2021) for Post Procedure Evaluation, virtual.       R L4/5 ESI 10/26/21  Recent Visits Date Type Provider Dept  10/06/21 Office Visit Gillis Santa, MD [facility]  Showing recent visits within past 90 days and meeting all other requirements Today's Visits Date Type Provider Dept  10/26/21 Procedure visit Gillis Santa, MD [facility]  Showing today's visits and meeting all other requirements Future Appointments Date Type Provider Dept  11/23/21 Appointment Gillis Santa, MD [facility]  12/29/21 Appointment Gillis Santa, MD [facility]  Showing future appointments within next 90 days and meeting all other requirements  Disposition: Discharge home  Discharge (Date  Time): 10/26/2021;   hrs.   Primary Care Physician: Delma Freeze, MD Location: [facility] Note by: Gillis Santa, MD Date: 10/26/2021; Time: 11:32 AM  Disclaimer:  Medicine is not an  exact science. The only guarantee in medicine is that nothing is guaranteed. It is important to note that the decision to proceed with this intervention was based on the information collected from the patient. The Data and conclusions were drawn from the patient's questionnaire, the interview, and the physical examination. Because the information was provided in large part by the patient, it cannot be guaranteed that it has not been purposely or unconsciously manipulated. Every effort has been made to obtain as much relevant data as possible for this evaluation. It is important to note that the conclusions that lead to this procedure are derived in large part from the available data. Always take into account that the treatment will also be dependent on availability of resources and existing treatment guidelines, considered by other Pain Management Practitioners as being common knowledge and practice, at the time of the intervention. For Medico-Legal purposes, it is also important to point out that variation in procedural techniques and pharmacological choices are the acceptable norm. The indications, contraindications, technique, and results of the above procedure should only be interpreted and judged by a Board-Certified Interventional Pain Specialist with extensive familiarity and expertise in the same exact procedure and technique.

## 2021-10-26 NOTE — Progress Notes (Signed)
Safety precautions to be maintained throughout the outpatient stay will include: orient to surroundings, keep bed in low position, maintain call bell within reach at all times, provide assistance with transfer out of bed and ambulation.  

## 2021-10-27 ENCOUNTER — Telehealth: Payer: Self-pay

## 2021-10-27 NOTE — Telephone Encounter (Signed)
Post procedure phone call.   No answer.  

## 2021-10-28 ENCOUNTER — Ambulatory Visit (INDEPENDENT_AMBULATORY_CARE_PROVIDER_SITE_OTHER): Payer: 59 | Admitting: *Deleted

## 2021-10-28 DIAGNOSIS — M2142 Flat foot [pes planus] (acquired), left foot: Secondary | ICD-10-CM

## 2021-10-28 DIAGNOSIS — M2141 Flat foot [pes planus] (acquired), right foot: Secondary | ICD-10-CM

## 2021-10-28 DIAGNOSIS — M7742 Metatarsalgia, left foot: Secondary | ICD-10-CM

## 2021-10-28 DIAGNOSIS — E0843 Diabetes mellitus due to underlying condition with diabetic autonomic (poly)neuropathy: Secondary | ICD-10-CM

## 2021-10-28 DIAGNOSIS — M7741 Metatarsalgia, right foot: Secondary | ICD-10-CM

## 2021-10-28 NOTE — Progress Notes (Signed)
Patient presents to the office today for diabetic shoe and insole measuring.  Patient was measured with brannock device to determine size and width for 1 pair of extra depth shoes and foam casted for 3 pair of insoles.   Documentation of medical necessity will be sent to patient's treating diabetic doctor to verify and sign.   Patient's diabetic provider: Dr. Veatrice Kells  Shoes and insoles will be ordered at that time and patient will be notified for an appointment for fitting when they arrive.   Shoe size (per patient): Men's 10   Brannock measurement: RIGHT: 10 Wide   LEFT: 10 Wide  Patient shoe selection-   1st choice:   Apex [id number]  Shoe size ordered: Men's 10 Wide

## 2021-11-07 NOTE — Unmapped (Signed)
Patient called. Reports that he continues to have knee pain despite injection on 10/24/21 and asked what the next steps are?.    He begins Physical Therapy on 11/09/21.

## 2021-11-07 NOTE — Unmapped (Signed)
Spoke with pt who verbalized understanding but also wanted this message sent through [place].

## 2021-11-07 NOTE — Unmapped (Signed)
If physical therapy does not start to improve the pain, I will need to have him see an orthopedist for a discussion around Synvisc (Hyaluronic acid injections under ultrasound).

## 2021-11-09 ENCOUNTER — Ambulatory Visit: Payer: 59

## 2021-11-09 NOTE — Unmapped (Signed)
PA initiated for Skyrizi 150 mg/mL PnIj through [facility]. Key: [id number] - PA Case ID: [id number]  PA good until 04/22/22

## 2021-11-09 NOTE — Unmapped (Signed)
Pt stated he was not discussing his knee pain that his knees are fine. He would like to discuss sciatica pain. He has pain in his butt. Denies radiating down leg. Laying down helps relieve some of the pain. Pt had a PT appointment today but canceled it due to the pain and wanted to know if it would be useful given the pain he was in anyway. He stated he had an epidural injection from the pain clinic 2 weeks ago that only lasted 1 day for pain relief. Please advise?

## 2021-11-10 NOTE — Unmapped (Signed)
Yes PT will be the best next step for his knees and sciatica!

## 2021-11-10 NOTE — Unmapped (Signed)
Left voicemail for the patient to call back when able.

## 2021-11-11 NOTE — Unmapped (Signed)
Patient contacted, message relayed, patient verbalized understanding, had no further questions

## 2021-11-14 ENCOUNTER — Ambulatory Visit: Payer: 59 | Attending: Physical Medicine & Rehabilitation

## 2021-11-14 DIAGNOSIS — M6281 Muscle weakness (generalized): Secondary | ICD-10-CM | POA: Insufficient documentation

## 2021-11-14 DIAGNOSIS — R262 Difficulty in walking, not elsewhere classified: Secondary | ICD-10-CM | POA: Diagnosis present

## 2021-11-14 DIAGNOSIS — M5459 Other low back pain: Secondary | ICD-10-CM | POA: Diagnosis present

## 2021-11-14 NOTE — Therapy (Signed)
OUTPATIENT PHYSICAL THERAPY TREATMENT NOTE   Patient Name: Mario [name] MRN: [id number] DOB:November 04, 1945, 76 y.o., male Today's Date: 11/14/2021   PT End of Session - 11/14/21 0904     Visit Number 2    Number of Visits 16    Date for PT Re-Evaluation 12/19/21    Authorization Type 1/10 eval 6/26    PT Start Time 0805    PT Stop Time 0844    PT Time Calculation (min) 39 min    Equipment Utilized During Treatment Gait belt    Activity Tolerance Patient tolerated treatment well    Behavior During Therapy WFL for tasks assessed/performed              Past Medical History:  Diagnosis Date   Arthritis    hands, knees   Atrial fibrillation (HCC)    CHF (congestive heart failure) (HCC)    COPD (chronic obstructive pulmonary disease) (HCC)    Diabetes mellitus without complication (HCC)    Diabetic neuropathy ([place])    Heart murmur    in past, mild 1969   Hypertension    resolved after weight loss and more activity   Neuromuscular disorder ([place])    neuropathy   Neuropathy    Pulmonary embolism ([place])    approx 2014   Shingles '99   Sleep apnea    CPAP   Wears dentures    full upper and lower   Past Surgical History:  Procedure Laterality Date   CATARACT EXTRACTION W/PHACO Left 05/12/2019   Procedure: CATARACT EXTRACTION PHACO AND INTRAOCULAR LENS PLACEMENT ([place]) LEFTDIABETIC ISTENT INJ 2.43  00:28.6;  Surgeon: Eulogio Bear, MD;  Location: [facility];  Service: Ophthalmology;  Laterality: Left;  Diabetic - oral meds sleep apnea   CATARACT EXTRACTION W/PHACO Right 06/02/2019   Procedure: CATARACT EXTRACTION PHACO AND INTRAOCULAR LENS PLACEMENT ([facility]) ISTENT INJ  RIGHT DIABETIC 1.31  00:25.4;  Surgeon: Eulogio Bear, MD;  Location: [facility];  Service: Ophthalmology;  Laterality: Right;   COLONOSCOPY  08/30/2015   FINGER SURGERY Right    3'rd finger, right hand   KNEE SURGERY Right    screw in knee   WRIST SURGERY Left    metal removed    Patient Active Problem List   Diagnosis Date Noted   Spinal stenosis, lumbar region, with neurogenic claudication 10/06/2021   Sacroiliac joint pain 10/06/2021   Piriformis muscle pain 10/06/2021   Bilateral primary osteoarthritis of knee 06/11/2019   Chronic radicular lumbar pain 06/11/2019   Cervical facet joint syndrome 06/11/2019   Lumbar facet arthropathy 06/11/2019   Lumbar radiculopathy 06/11/2019   Chronic pain syndrome 06/11/2019   Lung nodule 02/09/2016   Exercise hypoxemia 12/28/2015   Chronic obstructive pulmonary disease ([place]) 03/16/2015   Nuclear sclerosis 03/16/2015   OSA (obstructive sleep apnea) 03/16/2015   Primary open angle glaucoma of both eyes, severe stage 03/16/2015   Type 2 diabetes mellitus without complication, without long-term current use of insulin ([place]) 03/16/2015   Arthralgia of left lower leg 10/29/2014   Osteoarthritis of left knee 09/10/2014   Type 2 diabetes mellitus with diabetic neuropathy ([place]) 08/04/2014   Cataract, nuclear sclerotic senile 04/01/2014   Combined forms of age-related cataract 04/01/2014   Keratitis sicca, bilateral ([place]) 04/01/2014   Primary open angle glaucoma 02/27/2014   Flat feet 01/29/2014   Foot pain, bilateral 01/29/2014   Tinea pedis of both feet 01/29/2014   Ptosis of both eyelids 11/25/2013   Type 2 diabetes mellitus  without retinopathy (HCC) 11/25/2013   Onychomycosis 11/19/2013   Chronic systolic heart failure (HCC) 11/11/2013   Emphysema of lung (HCC) 11/11/2013   Essential hypertension 11/11/2013   Screen for colon cancer 11/05/2013   Cardiomyopathy (HCC) 10/08/2013   Systolic HF (heart failure) (HCC) 10/08/2013   Acute systolic CHF (congestive heart failure) (HCC) 09/16/2013   COPD (chronic obstructive pulmonary disease) (HCC) 09/16/2013   HTN (hypertension) 09/16/2013   Obesity 09/16/2013   PAF (paroxysmal atrial fibrillation) (HCC) 09/16/2013   Senile nuclear sclerosis 05/22/2013   Muscle weakness  (generalized) 11/11/2009   Long term current use of anticoagulant therapy 06/17/2009   Pulmonary embolism (HCC) 06/17/2009   ED (erectile dysfunction) of organic origin 05/13/2003    PCP: Maree Erie MD  REFERRING PROVIDER: Filomena Jungling I MD   REFERRING DIAG: back and LE pain   Rationale for Evaluation and Treatment Rehabilitation  THERAPY DIAG:  Other low back pain  Muscle weakness (generalized)  Difficulty in walking, not elsewhere classified  ONSET DATE: about 3 months ago.   SUBJECTIVE:                                                                                                                                                                                           SUBJECTIVE STATEMENT: Pt reports 2-3/10 pain in his bottom. Pt reports no falls, but near-falls first thing in the morning when trying to go to the bathroom. Pt on 2 L O2. Pt reports he has been stretching his back at home, but no other exercise from his HEP. He has chronic ringing in his ears on and off, going to ask his physician today about this.   PERTINENT HISTORY:  Patient presents to physical therapy for low back and LE pain. PMH includes: arthritis, a fib, CHF, COPD, DM, neuropathy, heart murmur, HTN, PE, shingles, spinal stenosis, SI pain, bilateral OA of knees, lumbar facet arthropathy, lumbar radiculopathy, chronic pain syndrome, lung nodule, exercise hypoxemia, glaucoma of bilateral eyes, flat feet, onychomycosis, PAF. Per patient notes patient was recommended surgery by orthopedics but patient declined. Since patient was seen last in this clinic he has been in the ED three times. Patient reports his sciatic pain has been worsening in the past three months.   PAIN:  Are you having pain? Yes: NPRS scale: 2-3/10 Pain location: low back, buttock Pain description: radiating Aggravating factors: walking, moving Relieving factors: rest   PRECAUTIONS: Fall  WEIGHT BEARING RESTRICTIONS  No  FALLS:  Has patient fallen in last 6 months? Yes. Number of falls 1  LIVING ENVIRONMENT: Lives with: lives with their family, lives with their spouse, and  lives with their partner Lives in: House/apartment Stairs: Yes: Internal: flight steps; can reach both Has following equipment at home: None  OCCUPATION: retired  PLOF: Independent with basic ADLs  PATIENT GOALS to have less pain and be able to walk again    OBJECTIVE: all objective information taken at eval unless otherwise indicated  DIAGNOSTIC FINDINGS:  MRI 08/27/21: L3-4: Mild chronic degenerative changes without visible neural compression or progression.   L4-5: Disc bulge. Facet degeneration and hypertrophy. Stenosis of the lateral recesses and foramina could possibly cause neural compression. Foraminal stenosis slightly worse on the right. Slight worsening since 2019. Small synovial cyst previously seen is no longer visible.   L5-S1: Disc and facet degeneration. Bilateral foraminal stenosis could compress either or both L5 nerves. Similar appearance to the prior study.  SENSATION: WFL  MUSCLE LENGTH: Hamstrings: Right 26 deg; Left 9 deg   PALPATION: Tender to bilateral buttocks and lumbar paraspinals. R more painful than L   LUMBAR ROM:   Active  A/PROM  eval  Flexion 41  Extension -4 from neutral*   Right lateral flexion 10 *  Left lateral flexion 10  Right rotation 6  Left rotation 8   (Blank rows = not tested) *pain    LOWER EXTREMITY MMT:    MMT Right eval Left eval  Hip flexion 3+ 3+  Hip extension 2 2  Hip abduction 3- 3-  Hip adduction 3- 3-  Hip internal rotation 2+ 2+  Hip external rotation 2+ 2+  Knee flexion 2+ 2+  Knee extension 3+ 3+  Ankle dorsiflexion 3 3  Ankle plantarflexion 3 3   (Blank rows = not tested) Mobilizations: CPA and UPA: hypomobile UPA to R lumbar spine L3-4, 4-5 increase radiating symptoms  LUMBAR SPECIAL TESTS:  Straight leg raise test:  Positive, Slump test: Positive, SI Compression/distraction test: Negative, and FABER test: Positive  FUNCTIONAL TESTS:  5 times sit to stand: 21.56 seconds with heavy BUE support  10 meter walk test: unable to perform.   GAIT: Distance walked: 4 ft Assistive device utilized: None Level of assistance: CGA Comments: very unstable and high fall risk     TODAY'S TREATMENT   11/14/2021 On large mat table, supine (bolster support BLE): Pelvic tilts with VC/TC/demo 10x with 3 sec holds  Progressed to addition of TrA activation 10x 3 sec holds, continued cuing.    LTRS 20x each side within pian-free range    PT assisted long-duration hamstring stretch each LE within pain tolerance   PT assisted long-duration figure 4 stretch BLE, reports LLE tighter than RLE  Supine marching 20x alt LE with 3 sec holds in 90 deg hip flex.  Manually resisted hip abduction 10x 3 sec BLE   Hip adductor squeezes with therpay ball 12 x 3 sec holds bilat  No pain with interventions.   PATIENT EDUCATION:  Education details: Pt educated throughout session about proper posture and technique with exercises. Improved exercise technique, movement at target joints, use of target muscles after min to mod verbal, visual, tactile cues.  Person educated: Patient Education method: Explanation, Demonstration, Tactile cues, and Verbal cues Education comprehension: verbalized understanding, returned demonstration, verbal cues required, tactile cues required, and needs further education   HOME EXERCISE PROGRAM:   No updates 11/14/2021 Access Code: [email or web address] URL: [email or web address] Date: 10/24/2021 Prepared by: Janna Arch  Exercises - Supine Lower Trunk Rotation  - 1 x daily - 7 x weekly - 2 sets - 10 reps - 5 hold - Supine  Posterior Pelvic Tilt  - 1 x daily - 7 x weekly - 2 sets - 10 reps - 5 hold - Supine Figure 4 Piriformis Stretch  - 1 x daily - 7 x weekly - 2 sets - 10 reps - 5 hold -  Seated Hamstring Stretch  - 1 x daily - 7 x weekly - 2 sets - 2 reps - 30 hold  ASSESSMENT:  CLINICAL IMPRESSION: PT continued plan as laid out in evaluation. Reviewed interventions for HEP as pt reported only performing stretching at home. Focus on interventions in supine. Pt able to tolerate all interventions without pain. The patient will benefit from skilled physical therapy to decrease pain, improve mobility, and increase strength for improved quality of life.    OBJECTIVE IMPAIRMENTS Abnormal gait, cardiopulmonary status limiting activity, decreased activity tolerance, decreased balance, decreased coordination, decreased endurance, decreased mobility, difficulty walking, decreased ROM, decreased strength, hypomobility, impaired perceived functional ability, increased muscle spasms, impaired flexibility, improper body mechanics, postural dysfunction, and pain.   ACTIVITY LIMITATIONS carrying, lifting, bending, sitting, standing, squatting, sleeping, stairs, transfers, bed mobility, bathing, toileting, dressing, reach over head, hygiene/grooming, and locomotion level  PARTICIPATION LIMITATIONS: meal prep, cleaning, laundry, driving, shopping, community activity, and yard work  PERSONAL FACTORS Age, Fitness, Past/current experiences, Time since onset of injury/illness/exacerbation, Transportation, and 3+ comorbidities: arthritis, a fib, CHF, COPD, DM, neuropathy, heart murmur, HTN, PE, shingles, spinal stenosis, SI pain, bilateral OA of knees, lumbar facet arthropathy, lumbar radiculopathy, chronic pain syndrome, lung nodule, exercise hypoxemia, glaucoma of bilateral eyes, flat feet, onychomycosis, PAF  are also affecting patient's functional outcome.   REHAB POTENTIAL: Fair    CLINICAL DECISION MAKING: Evolving/moderate complexity  EVALUATION COMPLEXITY: Moderate   GOALS: Goals reviewed with patient? Yes  SHORT TERM GOALS: Target date: 11/21/2021  Patient will be independent in home  exercise program to improve strength/mobility for better functional independence with ADLs. Baseline: 6/26: Hep given Goal status: INITIAL  2.  Patient will report a worst pain of 5/10 on VAS in   back and LE's to improve tolerance with ADLs and reduced symptoms with activities.  Baseline: 6/26: 10/10 Goal status: INITIAL LONG TERM GOALS: Target date: 12/19/2021  Patient will increase FOTO score to equal to or greater than  53   to demonstrate statistically significant improvement in mobility and quality of life.  Baseline: 6/26: 41% Goal status: INITIAL  2.  Patient will report a worst pain of 3/10 on VAS in  low back and LEs  to improve tolerance with ADLs and reduced symptoms with activities.  Baseline:6/26: 10/10  Goal status: INITIAL  3.  Patient (> 66 years old) will complete five times sit to stand test in < 15 seconds indicating an increased LE strength and improved balance. Baseline: 6/26: 21.56 seconds with heavy BUE support Goal status: INITIAL  4.   Patient will reduce modified Oswestry score to <20 as to demonstrate minimal disability with ADLs including improved sleeping tolerance, walking/sitting tolerance etc for better mobility with ADLs Baseline: 6/26: 60%  Goal status: INITIAL  5.  Patient will increase 10 meter walk test to >1.22m/s as to improve gait speed for better community ambulation and to reduce fall risk. Baseline: 6/26: unable to ambulate distance Goal status: INITIAL    PLAN: PT FREQUENCY: 2x/week  PT DURATION: 8 weeks  PLANNED INTERVENTIONS: Therapeutic exercises, Therapeutic activity, Neuromuscular re-education, Balance training, Gait training, Patient/Family education, Joint manipulation, Joint mobilization, Stair training, DME instructions, Dry Needling, Spinal mobilization, Cryotherapy, Moist heat, Taping,  Traction, Ultrasound, Ionotophoresis 4mg /ml Dexamethasone, and Manual therapy.  PLAN FOR NEXT SESSION: core stabilization, mobilization, LE  lengthening.   PT, DPT   11/14/2021, 9:10 AM

## 2021-11-16 ENCOUNTER — Ambulatory Visit: Payer: 59

## 2021-11-16 ENCOUNTER — Telehealth: Payer: Self-pay

## 2021-11-16 NOTE — Telephone Encounter (Signed)
LM for patient to call office for pre virtual appointment questions.  

## 2021-11-17 ENCOUNTER — Ambulatory Visit
Payer: 59 | Attending: Student in an Organized Health Care Education/Training Program | Admitting: Student in an Organized Health Care Education/Training Program

## 2021-11-17 DIAGNOSIS — M48062 Spinal stenosis, lumbar region with neurogenic claudication: Secondary | ICD-10-CM

## 2021-11-17 NOTE — Therapy (Signed)
OUTPATIENT PHYSICAL THERAPY TREATMENT NOTE   Patient Name: Mario [name] MRN: [id number] DOB:1945/08/03, 76 y.o., male Today's Date: 11/21/2021   PT End of Session - 11/21/21 0836     Visit Number 3    Number of Visits 16    Date for PT Re-Evaluation 12/19/21    Authorization Type 3/10 eval 6/26    PT Start Time 0845    PT Stop Time 0929    PT Time Calculation (min) 44 min    Equipment Utilized During Treatment Gait belt    Activity Tolerance Patient tolerated treatment well    Behavior During Therapy WFL for tasks assessed/performed               Past Medical History:  Diagnosis Date   Arthritis    hands, knees   Atrial fibrillation (HCC)    CHF (congestive heart failure) (HCC)    COPD (chronic obstructive pulmonary disease) (HCC)    Diabetes mellitus without complication (HCC)    Diabetic neuropathy ([place])    Heart murmur    in past, mild 1969   Hypertension    resolved after weight loss and more activity   Neuromuscular disorder ([place])    neuropathy   Neuropathy    Pulmonary embolism ([place])    approx 2014   Shingles '99   Sleep apnea    CPAP   Wears dentures    full upper and lower   Past Surgical History:  Procedure Laterality Date   CATARACT EXTRACTION W/PHACO Left 05/12/2019   Procedure: CATARACT EXTRACTION PHACO AND INTRAOCULAR LENS PLACEMENT ([place]) LEFTDIABETIC ISTENT INJ 2.43  00:28.6;  Surgeon: Eulogio Bear, MD;  Location: [facility];  Service: Ophthalmology;  Laterality: Left;  Diabetic - oral meds sleep apnea   CATARACT EXTRACTION W/PHACO Right 06/02/2019   Procedure: CATARACT EXTRACTION PHACO AND INTRAOCULAR LENS PLACEMENT ([facility]) ISTENT INJ  RIGHT DIABETIC 1.31  00:25.4;  Surgeon: Eulogio Bear, MD;  Location: [facility];  Service: Ophthalmology;  Laterality: Right;   COLONOSCOPY  08/30/2015   FINGER SURGERY Right    3'rd finger, right hand   KNEE SURGERY Right    screw in knee   WRIST SURGERY Left    metal removed    Patient Active Problem List   Diagnosis Date Noted   Spinal stenosis, lumbar region, with neurogenic claudication 10/06/2021   Sacroiliac joint pain 10/06/2021   Piriformis muscle pain 10/06/2021   Bilateral primary osteoarthritis of knee 06/11/2019   Chronic radicular lumbar pain 06/11/2019   Cervical facet joint syndrome 06/11/2019   Lumbar facet arthropathy 06/11/2019   Lumbar radiculopathy 06/11/2019   Chronic pain syndrome 06/11/2019   Lung nodule 02/09/2016   Exercise hypoxemia 12/28/2015   Chronic obstructive pulmonary disease ([place]) 03/16/2015   Nuclear sclerosis 03/16/2015   OSA (obstructive sleep apnea) 03/16/2015   Primary open angle glaucoma of both eyes, severe stage 03/16/2015   Type 2 diabetes mellitus without complication, without long-term current use of insulin ([place]) 03/16/2015   Arthralgia of left lower leg 10/29/2014   Osteoarthritis of left knee 09/10/2014   Type 2 diabetes mellitus with diabetic neuropathy ([place]) 08/04/2014   Cataract, nuclear sclerotic senile 04/01/2014   Combined forms of age-related cataract 04/01/2014   Keratitis sicca, bilateral ([place]) 04/01/2014   Primary open angle glaucoma 02/27/2014   Flat feet 01/29/2014   Foot pain, bilateral 01/29/2014   Tinea pedis of both feet 01/29/2014   Ptosis of both eyelids 11/25/2013   Type 2 diabetes  mellitus without retinopathy (HCC) 11/25/2013   Onychomycosis 11/19/2013   Chronic systolic heart failure (HCC) 11/11/2013   Emphysema of lung (HCC) 11/11/2013   Essential hypertension 11/11/2013   Screen for colon cancer 11/05/2013   Cardiomyopathy (HCC) 10/08/2013   Systolic HF (heart failure) (HCC) 10/08/2013   Acute systolic CHF (congestive heart failure) (HCC) 09/16/2013   COPD (chronic obstructive pulmonary disease) (HCC) 09/16/2013   HTN (hypertension) 09/16/2013   Obesity 09/16/2013   PAF (paroxysmal atrial fibrillation) (HCC) 09/16/2013   Senile nuclear sclerosis 05/22/2013   Muscle weakness  (generalized) 11/11/2009   Long term current use of anticoagulant therapy 06/17/2009   Pulmonary embolism (HCC) 06/17/2009   ED (erectile dysfunction) of organic origin 05/13/2003    PCP: Maree Erie MD  REFERRING PROVIDER: Filomena Jungling I MD   REFERRING DIAG: back and LE pain   Rationale for Evaluation and Treatment Rehabilitation  THERAPY DIAG:  Other low back pain  Muscle weakness (generalized)  Difficulty in walking, not elsewhere classified  ONSET DATE: about 3 months ago.   SUBJECTIVE:                                                                                                                                                                                           SUBJECTIVE STATEMENT: Patient cancelled last session, has been in ED for pain.   PERTINENT HISTORY:  Patient presents to physical therapy for low back and LE pain. PMH includes: arthritis, a fib, CHF, COPD, DM, neuropathy, heart murmur, HTN, PE, shingles, spinal stenosis, SI pain, bilateral OA of knees, lumbar facet arthropathy, lumbar radiculopathy, chronic pain syndrome, lung nodule, exercise hypoxemia, glaucoma of bilateral eyes, flat feet, onychomycosis, PAF. Per patient notes patient was recommended surgery by orthopedics but patient declined. Since patient was seen last in this clinic he has been in the ED three times. Patient reports his sciatic pain has been worsening in the past three months.   PAIN:  Are you having pain? Yes: NPRS scale: 6/10 Pain location: low back, buttock Pain description: radiating Aggravating factors: walking, moving Relieving factors: rest   PRECAUTIONS: Fall  WEIGHT BEARING RESTRICTIONS No  FALLS:  Has patient fallen in last 6 months? Yes. Number of falls 1  LIVING ENVIRONMENT: Lives with: lives with their family, lives with their spouse, and lives with their partner Lives in: House/apartment Stairs: Yes: Internal: flight steps; can reach both Has  following equipment at home: None  OCCUPATION: retired  PLOF: Independent with basic ADLs  PATIENT GOALS to have less pain and be able to walk again    OBJECTIVE: all objective information taken at eval unless  otherwise indicated  DIAGNOSTIC FINDINGS:  MRI 08/27/21: L3-4: Mild chronic degenerative changes without visible neural compression or progression.   L4-5: Disc bulge. Facet degeneration and hypertrophy. Stenosis of the lateral recesses and foramina could possibly cause neural compression. Foraminal stenosis slightly worse on the right. Slight worsening since 2019. Small synovial cyst previously seen is no longer visible.   L5-S1: Disc and facet degeneration. Bilateral foraminal stenosis could compress either or both L5 nerves. Similar appearance to the prior study.    LUMBAR ROM:   Active  A/PROM  eval  Flexion 41  Extension -4 from neutral*   Right lateral flexion 10 *  Left lateral flexion 10  Right rotation 6  Left rotation 8   (Blank rows = not tested) *pain    LOWER EXTREMITY MMT:    MMT Right eval Left eval  Hip flexion 3+ 3+  Hip extension 2 2  Hip abduction 3- 3-  Hip adduction 3- 3-  Hip internal rotation 2+ 2+  Hip external rotation 2+ 2+  Knee flexion 2+ 2+  Knee extension 3+ 3+  Ankle dorsiflexion 3 3  Ankle plantarflexion 3 3    TODAY'S TREATMENT   Manual:  Hamstring lengthening with leg on PT arm 45 seconds each le Single knee to chest 30 seconds each LE Figure four stretch 30 seconds each LE Sciatic nerve glide with leg on PT arm 20x each LE SAD in figure four position RLE 30 seconds x 3 inferior direction   TherEx:    Green swiss ball TrA activation pressing between hands and knees 3 seconds 12x    Green swiss ball with heel slides 10x each LE   SAQ over bolster 15x each LE 3 second holds   Hip flexion 10x each LE    Posterior pelvic tilt 10x 3 seconds    One leg abduction with exhale to return to neutral 10x each  side   GTB abduction 15x   Education on log roll technique   PATIENT EDUCATION:  Education details: Pt educated throughout session about proper posture and technique with exercises. Improved exercise technique, movement at target joints, use of target muscles after min to mod verbal, visual, tactile cues.  Person educated: Patient Education method: Explanation, Demonstration, Tactile cues, and Verbal cues Education comprehension: verbalized understanding, returned demonstration, verbal cues required, tactile cues required, and needs further education   HOME EXERCISE PROGRAM:   No updates 11/14/2021 Access Code: [email or web address] URL: [email or web address] Date: 10/24/2021 Prepared by: Precious Bard  Exercises - Supine Lower Trunk Rotation  - 1 x daily - 7 x weekly - 2 sets - 10 reps - 5 hold - Supine Posterior Pelvic Tilt  - 1 x daily - 7 x weekly - 2 sets - 10 reps - 5 hold - Supine Figure 4 Piriformis Stretch  - 1 x daily - 7 x weekly - 2 sets - 10 reps - 5 hold - Seated Hamstring Stretch  - 1 x daily - 7 x weekly - 2 sets - 2 reps - 30 hold  ASSESSMENT:  CLINICAL IMPRESSION: Patient returning to therapy after ED visit from pain. Has significant RLE pain throughout session. Core activation stabilization interventions performed and tolerated well. The patient will benefit from skilled physical therapy to decrease pain, improve mobility, and increase strength for improved quality of life.    OBJECTIVE IMPAIRMENTS Abnormal gait, cardiopulmonary status limiting activity, decreased activity tolerance, decreased balance, decreased coordination, decreased endurance, decreased mobility, difficulty walking, decreased ROM, decreased strength, hypomobility,  impaired perceived functional ability, increased muscle spasms, impaired flexibility, improper body mechanics, postural dysfunction, and pain.   ACTIVITY LIMITATIONS carrying, lifting, bending, sitting, standing, squatting, sleeping,  stairs, transfers, bed mobility, bathing, toileting, dressing, reach over head, hygiene/grooming, and locomotion level  PARTICIPATION LIMITATIONS: meal prep, cleaning, laundry, driving, shopping, community activity, and yard work  PERSONAL FACTORS Age, Fitness, Past/current experiences, Time since onset of injury/illness/exacerbation, Transportation, and 3+ comorbidities: arthritis, a fib, CHF, COPD, DM, neuropathy, heart murmur, HTN, PE, shingles, spinal stenosis, SI pain, bilateral OA of knees, lumbar facet arthropathy, lumbar radiculopathy, chronic pain syndrome, lung nodule, exercise hypoxemia, glaucoma of bilateral eyes, flat feet, onychomycosis, PAF  are also affecting patient's functional outcome.   REHAB POTENTIAL: Fair    CLINICAL DECISION MAKING: Evolving/moderate complexity  EVALUATION COMPLEXITY: Moderate   GOALS: Goals reviewed with patient? Yes  SHORT TERM GOALS: Target date: 11/21/2021  Patient will be independent in home exercise program to improve strength/mobility for better functional independence with ADLs. Baseline: 6/26: Hep given Goal status: INITIAL  2.  Patient will report a worst pain of 5/10 on VAS in   back and LE's to improve tolerance with ADLs and reduced symptoms with activities.  Baseline: 6/26: 10/10 Goal status: INITIAL LONG TERM GOALS: Target date: 12/19/2021  Patient will increase FOTO score to equal to or greater than  53   to demonstrate statistically significant improvement in mobility and quality of life.  Baseline: 6/26: 41% Goal status: INITIAL  2.  Patient will report a worst pain of 3/10 on VAS in  low back and LEs  to improve tolerance with ADLs and reduced symptoms with activities.  Baseline:6/26: 10/10  Goal status: INITIAL  3.  Patient (> 61 years old) will complete five times sit to stand test in < 15 seconds indicating an increased LE strength and improved balance. Baseline: 6/26: 21.56 seconds with heavy BUE support Goal status:  INITIAL  4.   Patient will reduce modified Oswestry score to <20 as to demonstrate minimal disability with ADLs including improved sleeping tolerance, walking/sitting tolerance etc for better mobility with ADLs Baseline: 6/26: 60%  Goal status: INITIAL  5.  Patient will increase 10 meter walk test to >1.76m/s as to improve gait speed for better community ambulation and to reduce fall risk. Baseline: 6/26: unable to ambulate distance Goal status: INITIAL    PLAN: PT FREQUENCY: 2x/week  PT DURATION: 8 weeks  PLANNED INTERVENTIONS: Therapeutic exercises, Therapeutic activity, Neuromuscular re-education, Balance training, Gait training, Patient/Family education, Joint manipulation, Joint mobilization, Stair training, DME instructions, Dry Needling, Spinal mobilization, Cryotherapy, Moist heat, Taping, Traction, Ultrasound, Ionotophoresis 4mg /ml Dexamethasone, and Manual therapy.  PLAN FOR NEXT SESSION: core stabilization, mobilization, LE lengthening.     Janna Arch, PT, DPT  11/21/2021, 9:29 AM

## 2021-11-17 NOTE — Progress Notes (Signed)
I attempted to call the patient however no response. Voicemail left instructing patient to call front desk office at [phone] to reschedule appointment. -Dr Kijana Cromie  

## 2021-11-18 ENCOUNTER — Emergency Department
Admission: EM | Admit: 2021-11-18 | Discharge: 2021-11-18 | Disposition: A | Discharge status: Home / Self Care | Payer: 59 | Attending: Emergency Medicine | Admitting: Emergency Medicine

## 2021-11-18 ENCOUNTER — Other Ambulatory Visit: Payer: Self-pay

## 2021-11-18 ENCOUNTER — Encounter: Payer: Self-pay | Admitting: Emergency Medicine

## 2021-11-18 DIAGNOSIS — G8929 Other chronic pain: Secondary | ICD-10-CM | POA: Insufficient documentation

## 2021-11-18 DIAGNOSIS — M545 Low back pain, unspecified: Secondary | ICD-10-CM | POA: Diagnosis present

## 2021-11-18 DIAGNOSIS — E1140 Type 2 diabetes mellitus with diabetic neuropathy, unspecified: Secondary | ICD-10-CM | POA: Diagnosis not present

## 2021-11-18 DIAGNOSIS — J449 Chronic obstructive pulmonary disease, unspecified: Secondary | ICD-10-CM | POA: Diagnosis not present

## 2021-11-18 DIAGNOSIS — I509 Heart failure, unspecified: Secondary | ICD-10-CM | POA: Diagnosis not present

## 2021-11-18 DIAGNOSIS — M533 Sacrococcygeal disorders, not elsewhere classified: Secondary | ICD-10-CM | POA: Insufficient documentation

## 2021-11-18 DIAGNOSIS — M5431 Sciatica, right side: Secondary | ICD-10-CM

## 2021-11-18 DIAGNOSIS — I110 Hypertensive heart disease with heart failure: Secondary | ICD-10-CM | POA: Insufficient documentation

## 2021-11-18 MED ORDER — OXYCODONE-ACETAMINOPHEN 5-325 MG PO TABS: 1.0000 | ORAL_TABLET | Freq: Three times a day (TID) | ORAL | 0 refills | Status: AC | PRN

## 2021-11-18 MED ORDER — OXYCODONE-ACETAMINOPHEN 5-325 MG PO TABS
1.0000 | ORAL_TABLET | Freq: Once | ORAL | Status: AC
  Administered 2021-11-18: 1 via ORAL
  Filled 2021-11-18: qty 1

## 2021-11-18 NOTE — ED Provider Notes (Signed)
[facility] Provider Note    Event Date/Time   First MD Initiated Contact with Patient 11/18/21 1408     (approximate)   History   Sciatica   HPI  Mario [name] is a 76 y.o. male with past medical history of HTN, diabetic neuropathy, COPD, CHF, A-fib, remote PE, OSA, chronic radicular lumbar pain and lumbar facet arthropathy as well as chronic pain syndrome currently working with PT and his pain specialist who presents for evaluation of ongoing progressive chronic low back pain radiating down his right leg.  No specific changes over the last couple days.  States he is on gabapentin and has tried Tylenol but this has not helped.  He was on a short course of Percocet last month and is helped a little bit but it was not extended and he states his pain doctor is going through a stepwise process trying epidural injections which only helped for about a day.  He states he was seen by orthopedist last year but has not had any other follow-up by spine surgeon.  He denies any fevers, cough, chest pain, shortness of breath, abdominal pain or any new acute weakness numbness or tingling today.  States he sometimes is a little constipation but is able to have bowel movements.  Denies IV drug use and history of malignancy.  He cannot take NSAIDs because he is on a blood thinner for his heart.  He has no other acute concerns at this time otherwise fairly frustrated that he feels he is not getting better.  States he has difficulty walking and has to hold onto the wall or furniture to get around and feels he cannot stay still due to pain for more than an hour or 2 at a time.      Physical Exam  Triage Vital Signs: ED Triage Vitals  Enc Vitals Group     BP 11/18/21 1304 97/80     Pulse Rate 11/18/21 1304 78     Resp 11/18/21 1304 18     Temp 11/18/21 1304 98.2 F (36.8 C)     Temp Source 11/18/21 1304 Oral     SpO2 11/18/21 1304 98 %     Weight 11/18/21 1307 235 lb (106.6 kg)      Height 11/18/21 1307 5\' 10"  (1.778 m)     Head Circumference --      Peak Flow --      Pain Score 11/18/21 1307 6     Pain Loc --      Pain Edu? --      Excl. in GC? --     Most recent vital signs: Vitals:   11/18/21 1304  BP: 97/80  Pulse: 78  Resp: 18  Temp: 98.2 F (36.8 C)  SpO2: 98%    General: Awake, no distress.  CV:  Good peripheral perfusion.  Resp:  Normal effort.  Abd:  No distention.  Other:  There is no midline tenderness over the T or L-spine.  Some mild bilateral SI tenderness.  Patient has symmetric strength in the bilateral lower extremities.  2+ PT pulses.  2+ bilateral patellar reflexes.  Sensation is intact to light touch throughout the bilateral extremities.  Positive right-sided straight leg test.   ED Results / Procedures / Treatments  Labs (all labs ordered are listed, but only abnormal results are displayed) Labs Reviewed - No data to display   EKG   RADIOLOGY   PROCEDURES:  Critical Care performed: No  Procedures  MEDICATIONS ORDERED IN ED: Medications - No data to display   IMPRESSION / MDM / ASSESSMENT AND PLAN / ED COURSE  I reviewed the triage vital signs and the nursing notes. Patient's presentation is most consistent with acute presentation with potential threat to life or bodily function.                               Differential diagnosis includes, but is not limited to progression of his sciatic symptoms related to radicular nerve root compression.  I considered infection, acute spinal cord compression and hematoma but feel these are less likely given he is neuro intact and has no acute symptoms today.  I reviewed his MRI from 4/29 of this year that showed fairly significant degenerative changes and facet arthropathy and spinal stenosis which would all explain patient's symptoms.  He states they are not significantly different does not improve to be slightly worse but otherwise unchanged over the last couple weeks.  Had  extensive discussion with patient regarding possible long-term options and acute pain management.  He is already taking gabapentin and Tylenol.  He states he has had steroids including injection recently that only helped for less than 24 hours.  I think it is reasonable for him to get 3 days of an opioid as he is unable to take NSAIDs and is fairly debilitated by this.  Discussed at length the risks of this and importance of taking stool softener to avoid constipation.  He will follow-up with his pain specialist on Monday.  Second it seems he has not seen a spine specialist in over 6 months for this and states that they had referred him to see someone else but they were not a surgeon he does not think and he states he is interested in seeing a spinal doctor to see if he has any additional surgical options.  Per review of records it seems he declined this in the past but possibly has changes mind at this time.  I will have him follow-up with neurosurgery and if needed they can order nonemergent MRI at that time.  Discussed that if he does develop any unilateral weakness, numbness, paresthesias, incontinence, fevers or any other sick symptoms he must return immediately to the emergency room at this point they can continue following up with his pain specialist and neurosurgery.  He is agreeable this plan.  Discharged in stable condition.  Strict and precautions advised and discussed.        FINAL CLINICAL IMPRESSION(S) / ED DIAGNOSES   Final diagnoses:  None     Rx / DC Orders   ED Discharge Orders     None        Note:  This document was prepared using Dragon voice recognition software and may include unintentional dictation errors.   Gilles Chiquito, MD 11/18/21 1444

## 2021-11-18 NOTE — ED Notes (Signed)
See triage note  Presents with back pain  Hx of sciatica pain Pain is moving into right leg  denies any recent injury

## 2021-11-18 NOTE — ED Triage Notes (Signed)
Pt states that he has a sciatic nerve problem and he is having pain from his back down his right leg

## 2021-11-18 NOTE — Unmapped (Signed)
Pt called and stated that he is still having a lot of pain in his knees and his sciatica that he can barely walk. He has tried calling pain management several times to get help with out getting anywhere. Patient would like to know what his next steps should be. Please advise, thank you!

## 2021-11-21 ENCOUNTER — Ambulatory Visit
Payer: 59 | Attending: Student in an Organized Health Care Education/Training Program | Admitting: Student in an Organized Health Care Education/Training Program

## 2021-11-21 ENCOUNTER — Ambulatory Visit: Payer: 59

## 2021-11-21 ENCOUNTER — Telehealth: Payer: Self-pay

## 2021-11-21 DIAGNOSIS — M6281 Muscle weakness (generalized): Secondary | ICD-10-CM

## 2021-11-21 DIAGNOSIS — M5459 Other low back pain: Secondary | ICD-10-CM | POA: Diagnosis not present

## 2021-11-21 DIAGNOSIS — M48062 Spinal stenosis, lumbar region with neurogenic claudication: Secondary | ICD-10-CM

## 2021-11-21 DIAGNOSIS — R262 Difficulty in walking, not elsewhere classified: Secondary | ICD-10-CM

## 2021-11-21 NOTE — Progress Notes (Signed)
I attempted to call the patient however no response. Voicemail left instructing patient to call front desk office at [phone] to reschedule appointment. -Dr Malyna Budney  

## 2021-11-21 NOTE — Telephone Encounter (Signed)
We have received a referral from the ER for Lumbar Radiculopathy. Upon review he has had a MRI, ESIs with Dr. Cherylann Ratel and started PT today. You can offer him an appt in about 4-5 weeks so he can attempt more physical therapy before his appt.

## 2021-11-21 NOTE — Telephone Encounter (Signed)
Patient had PT today. He said to call him in about 3 weeks to she how he is doing.

## 2021-11-22 NOTE — Therapy (Signed)
OUTPATIENT PHYSICAL THERAPY TREATMENT NOTE   Patient Name: Mario [name] MRN: [id number] DOB:1946/03/27, 76 y.o., male Today's Date: 11/23/2021   PT End of Session - 11/23/21 0804     Visit Number 4    Number of Visits 16    Date for PT Re-Evaluation 12/19/21    Authorization Type 4/10 eval 6/26    PT Start Time 0800    PT Stop Time 0844    PT Time Calculation (min) 44 min    Equipment Utilized During Treatment Gait belt    Activity Tolerance Patient tolerated treatment well    Behavior During Therapy WFL for tasks assessed/performed                Past Medical History:  Diagnosis Date   Arthritis    hands, knees   Atrial fibrillation (HCC)    CHF (congestive heart failure) (HCC)    COPD (chronic obstructive pulmonary disease) ([place])    Diabetes mellitus without complication ([place])    Diabetic neuropathy ([place])    Heart murmur    in past, mild 1969   Hypertension    resolved after weight loss and more activity   Neuromuscular disorder ([place])    neuropathy   Neuropathy    Pulmonary embolism ([place])    approx 2014   Shingles '99   Sleep apnea    CPAP   Wears dentures    full upper and lower   Past Surgical History:  Procedure Laterality Date   CATARACT EXTRACTION W/PHACO Left 05/12/2019   Procedure: CATARACT EXTRACTION PHACO AND INTRAOCULAR LENS PLACEMENT ([place]) LEFTDIABETIC ISTENT INJ 2.43  00:28.6;  Surgeon: Eulogio Bear, MD;  Location: [facility];  Service: Ophthalmology;  Laterality: Left;  Diabetic - oral meds sleep apnea   CATARACT EXTRACTION W/PHACO Right 06/02/2019   Procedure: CATARACT EXTRACTION PHACO AND INTRAOCULAR LENS PLACEMENT ([facility]) ISTENT INJ  RIGHT DIABETIC 1.31  00:25.4;  Surgeon: Eulogio Bear, MD;  Location: [facility];  Service: Ophthalmology;  Laterality: Right;   COLONOSCOPY  08/30/2015   FINGER SURGERY Right    3'rd finger, right hand   KNEE SURGERY Right    screw in knee   WRIST SURGERY Left    metal removed    Patient Active Problem List   Diagnosis Date Noted   Spinal stenosis, lumbar region, with neurogenic claudication 10/06/2021   Sacroiliac joint pain 10/06/2021   Piriformis muscle pain 10/06/2021   Bilateral primary osteoarthritis of knee 06/11/2019   Chronic radicular lumbar pain 06/11/2019   Cervical facet joint syndrome 06/11/2019   Lumbar facet arthropathy 06/11/2019   Lumbar radiculopathy 06/11/2019   Chronic pain syndrome 06/11/2019   Lung nodule 02/09/2016   Exercise hypoxemia 12/28/2015   Chronic obstructive pulmonary disease ([place]) 03/16/2015   Nuclear sclerosis 03/16/2015   OSA (obstructive sleep apnea) 03/16/2015   Primary open angle glaucoma of both eyes, severe stage 03/16/2015   Type 2 diabetes mellitus without complication, without long-term current use of insulin ([place]) 03/16/2015   Arthralgia of left lower leg 10/29/2014   Osteoarthritis of left knee 09/10/2014   Type 2 diabetes mellitus with diabetic neuropathy ([place]) 08/04/2014   Cataract, nuclear sclerotic senile 04/01/2014   Combined forms of age-related cataract 04/01/2014   Keratitis sicca, bilateral ([place]) 04/01/2014   Primary open angle glaucoma 02/27/2014   Flat feet 01/29/2014   Foot pain, bilateral 01/29/2014   Tinea pedis of both feet 01/29/2014   Ptosis of both eyelids 11/25/2013   Type 2  diabetes mellitus without retinopathy (HCC) 11/25/2013   Onychomycosis 11/19/2013   Chronic systolic heart failure (HCC) 11/11/2013   Emphysema of lung (HCC) 11/11/2013   Essential hypertension 11/11/2013   Screen for colon cancer 11/05/2013   Cardiomyopathy (HCC) 10/08/2013   Systolic HF (heart failure) (HCC) 10/08/2013   Acute systolic CHF (congestive heart failure) (HCC) 09/16/2013   COPD (chronic obstructive pulmonary disease) (HCC) 09/16/2013   HTN (hypertension) 09/16/2013   Obesity 09/16/2013   PAF (paroxysmal atrial fibrillation) (HCC) 09/16/2013   Senile nuclear sclerosis 05/22/2013   Muscle weakness  (generalized) 11/11/2009   Long term current use of anticoagulant therapy 06/17/2009   Pulmonary embolism (HCC) 06/17/2009   ED (erectile dysfunction) of organic origin 05/13/2003    PCP: Maree Erie MD  REFERRING PROVIDER: Filomena Jungling I MD   REFERRING DIAG: back and LE pain   Rationale for Evaluation and Treatment Rehabilitation  THERAPY DIAG:  Other low back pain  Muscle weakness (generalized)  Difficulty in walking, not elsewhere classified  ONSET DATE: about 3 months ago.   SUBJECTIVE:                                                                                                                                                                                           SUBJECTIVE STATEMENT: Patient reports his pain prevented him from sleeping last night.   PERTINENT HISTORY:  Patient presents to physical therapy for low back and LE pain. PMH includes: arthritis, a fib, CHF, COPD, DM, neuropathy, heart murmur, HTN, PE, shingles, spinal stenosis, SI pain, bilateral OA of knees, lumbar facet arthropathy, lumbar radiculopathy, chronic pain syndrome, lung nodule, exercise hypoxemia, glaucoma of bilateral eyes, flat feet, onychomycosis, PAF. Per patient notes patient was recommended surgery by orthopedics but patient declined. Since patient was seen last in this clinic he has been in the ED three times. Patient reports his sciatic pain has been worsening in the past three months.   PAIN:  Are you having pain? Yes: NPRS scale: 6/10 Pain location: low back, buttock Pain description: radiating Aggravating factors: walking, moving Relieving factors: rest   PRECAUTIONS: Fall  WEIGHT BEARING RESTRICTIONS No  FALLS:  Has patient fallen in last 6 months? Yes. Number of falls 1  LIVING ENVIRONMENT: Lives with: lives with their family, lives with their spouse, and lives with their partner Lives in: House/apartment Stairs: Yes: Internal: flight steps; can reach both Has  following equipment at home: None  OCCUPATION: retired  PLOF: Independent with basic ADLs  PATIENT GOALS to have less pain and be able to walk again    OBJECTIVE: all objective information taken at eval  unless otherwise indicated  DIAGNOSTIC FINDINGS:  MRI 08/27/21: L3-4: Mild chronic degenerative changes without visible neural compression or progression.   L4-5: Disc bulge. Facet degeneration and hypertrophy. Stenosis of the lateral recesses and foramina could possibly cause neural compression. Foraminal stenosis slightly worse on the right. Slight worsening since 2019. Small synovial cyst previously seen is no longer visible.   L5-S1: Disc and facet degeneration. Bilateral foraminal stenosis could compress either or both L5 nerves. Similar appearance to the prior study.    LUMBAR ROM:   Active  A/PROM  eval  Flexion 41  Extension -4 from neutral*   Right lateral flexion 10 *  Left lateral flexion 10  Right rotation 6  Left rotation 8   (Blank rows = not tested) *pain    LOWER EXTREMITY MMT:    MMT Right eval Left eval  Hip flexion 3+ 3+  Hip extension 2 2  Hip abduction 3- 3-  Hip adduction 3- 3-  Hip internal rotation 2+ 2+  Hip external rotation 2+ 2+  Knee flexion 2+ 2+  Knee extension 3+ 3+  Ankle dorsiflexion 3 3  Ankle plantarflexion 3 3    TODAY'S TREATMENT   Manual:  Hamstring lengthening with leg on PT arm 45 seconds each le Single knee to chest 30 seconds each LE Figure four stretch 30 seconds each LE Sciatic nerve glide with leg on PT arm 20x each LE  TherEx:  Supine:      Adduction ball squeeze 12   Posterior pelvic tilt 12x 3 seconds    Posterior pelvic tilt with adduction ball squeeze 12x  Patient has to get out of supine position due to dizziness and feeling "off"-water given, vitals taken: Sp02 90% on 2 L of 02, HR 89, BP: 103/68   Seated: TrA activation pressing into swiss ball 10x 3 second holds TrA activation pressing  into swiss ball with UE raises 10x each UE  GTB abduction 15x GTB march 15x cues for upright posture  Heel toe raises 20x   Education on log roll technique   PATIENT EDUCATION:  Education details: Pt educated throughout session about proper posture and technique with exercises. Improved exercise technique, movement at target joints, use of target muscles after min to mod verbal, visual, tactile cues.  Person educated: Patient Education method: Explanation, Demonstration, Tactile cues, and Verbal cues Education comprehension: verbalized understanding, returned demonstration, verbal cues required, tactile cues required, and needs further education   HOME EXERCISE PROGRAM:   No updates 11/14/2021 Access Code: [email or web address] URL: [email or web address] Date: 10/24/2021 Prepared by: Precious Bard  Exercises - Supine Lower Trunk Rotation  - 1 x daily - 7 x weekly - 2 sets - 10 reps - 5 hold - Supine Posterior Pelvic Tilt  - 1 x daily - 7 x weekly - 2 sets - 10 reps - 5 hold - Supine Figure 4 Piriformis Stretch  - 1 x daily - 7 x weekly - 2 sets - 10 reps - 5 hold - Seated Hamstring Stretch  - 1 x daily - 7 x weekly - 2 sets - 2 reps - 30 hold  ASSESSMENT:  CLINICAL IMPRESSION: Patient reports increased pain last night preventing him from sleeping; focus on pain reduction strategies in combination with core activation technique performed. Patient has limited hamstring length of LLE with limited ROM prior to pain onset. Patient does have shortness of breath with supine and was transitioned to seated. The patient will benefit from skilled physical therapy to decrease pain,  improve mobility, and increase strength for improved quality of life.    OBJECTIVE IMPAIRMENTS Abnormal gait, cardiopulmonary status limiting activity, decreased activity tolerance, decreased balance, decreased coordination, decreased endurance, decreased mobility, difficulty walking, decreased ROM, decreased  strength, hypomobility, impaired perceived functional ability, increased muscle spasms, impaired flexibility, improper body mechanics, postural dysfunction, and pain.   ACTIVITY LIMITATIONS carrying, lifting, bending, sitting, standing, squatting, sleeping, stairs, transfers, bed mobility, bathing, toileting, dressing, reach over head, hygiene/grooming, and locomotion level  PARTICIPATION LIMITATIONS: meal prep, cleaning, laundry, driving, shopping, community activity, and yard work  PERSONAL FACTORS Age, Fitness, Past/current experiences, Time since onset of injury/illness/exacerbation, Transportation, and 3+ comorbidities: arthritis, a fib, CHF, COPD, DM, neuropathy, heart murmur, HTN, PE, shingles, spinal stenosis, SI pain, bilateral OA of knees, lumbar facet arthropathy, lumbar radiculopathy, chronic pain syndrome, lung nodule, exercise hypoxemia, glaucoma of bilateral eyes, flat feet, onychomycosis, PAF  are also affecting patient's functional outcome.   REHAB POTENTIAL: Fair    CLINICAL DECISION MAKING: Evolving/moderate complexity  EVALUATION COMPLEXITY: Moderate   GOALS: Goals reviewed with patient? Yes  SHORT TERM GOALS: Target date: 11/21/2021  Patient will be independent in home exercise program to improve strength/mobility for better functional independence with ADLs. Baseline: 6/26: Hep given Goal status: INITIAL  2.  Patient will report a worst pain of 5/10 on VAS in   back and LE's to improve tolerance with ADLs and reduced symptoms with activities.  Baseline: 6/26: 10/10 Goal status: INITIAL LONG TERM GOALS: Target date: 12/19/2021  Patient will increase FOTO score to equal to or greater than  53   to demonstrate statistically significant improvement in mobility and quality of life.  Baseline: 6/26: 41% Goal status: INITIAL  2.  Patient will report a worst pain of 3/10 on VAS in  low back and LEs  to improve tolerance with ADLs and reduced symptoms with activities.   Baseline:6/26: 10/10  Goal status: INITIAL  3.  Patient (> 33 years old) will complete five times sit to stand test in < 15 seconds indicating an increased LE strength and improved balance. Baseline: 6/26: 21.56 seconds with heavy BUE support Goal status: INITIAL  4.   Patient will reduce modified Oswestry score to <20 as to demonstrate minimal disability with ADLs including improved sleeping tolerance, walking/sitting tolerance etc for better mobility with ADLs Baseline: 6/26: 60%  Goal status: INITIAL  5.  Patient will increase 10 meter walk test to >1.7m/s as to improve gait speed for better community ambulation and to reduce fall risk. Baseline: 6/26: unable to ambulate distance Goal status: INITIAL    PLAN: PT FREQUENCY: 2x/week  PT DURATION: 8 weeks  PLANNED INTERVENTIONS: Therapeutic exercises, Therapeutic activity, Neuromuscular re-education, Balance training, Gait training, Patient/Family education, Joint manipulation, Joint mobilization, Stair training, DME instructions, Dry Needling, Spinal mobilization, Cryotherapy, Moist heat, Taping, Traction, Ultrasound, Ionotophoresis 4mg /ml Dexamethasone, and Manual therapy.  PLAN FOR NEXT SESSION: core stabilization, mobilization, LE lengthening.     Janna Arch, PT, DPT  11/23/2021, 9:30 AM

## 2021-11-23 ENCOUNTER — Ambulatory Visit: Payer: 59

## 2021-11-23 ENCOUNTER — Telehealth: Payer: 59 | Admitting: Student in an Organized Health Care Education/Training Program

## 2021-11-23 DIAGNOSIS — R262 Difficulty in walking, not elsewhere classified: Secondary | ICD-10-CM

## 2021-11-23 DIAGNOSIS — M6281 Muscle weakness (generalized): Secondary | ICD-10-CM

## 2021-11-23 DIAGNOSIS — M5459 Other low back pain: Secondary | ICD-10-CM | POA: Diagnosis not present

## 2021-11-24 NOTE — Unmapped (Unsigned)
Dermatology Note     Assessment and Plan:      Plaque psoriasis  - >5% BSA involved  - failed high potency topical steroids  - no hx of joint stiffness or pains  - Diagnosis, natural course, and treatment options were discussed with the patient. Patient has no history of IBD. Joint decision made to proceed w/ cosentyx. Printed drug information.  - secukinumab (COSENTYX) 150 mg/mL PnIj injection; Inject 2 mL (300 mg total) under the skin once a week. Take for 5 doses, week 0, 1, 2, 3, 4  - secukinumab (COSENTYX PEN, 2 PENS,) 150 mg/mL PnIj injection; Inject 2 mL (300 mg total) under the skin every twenty-eight (28) days.  - refilled clobetasoL (TEMOVATE) 0.05 % ointment; Apply topically Two (2) times a day. Use on psoriasis until smooth.    High risk medication use, cosentyx initiation  - CBC w/ Differential; Future  - AST; Future  - ALT; Future  - BUN; Future  - Creatinine; Future  - Hepatitis B Core Antibody, total; Future  - Hepatitis B Surface Antibody; Future  - Hepatitis B Surface Antigen; Future  - Quantiferon TB Gold Plus; Future  - Hep C ab negative 06/24/21    There are no diagnoses linked to this encounter.    The patient was advised to call for an appointment should any new, changing, or symptomatic lesions develop.     RTC: No follow-ups on file. or sooner as needed   _________________________________________________________________      Chief Complaint     No chief complaint on file.      HPI     Paul Velez is a 76 y.o. male who presents as a returning patient (last seen by Dr. Stark Jock and Dr. Odis Luster on 09/14/2021) to Dermatology for  psoriasis. At last visit, patient was to start cosentyx 150 mg/mL injection and cosentyx pen and continue clobetasol 0.05% ointment for psoriasis.     Today, patient reports ***    The patient denies any other new or changing lesions or areas of concern.     Pertinent Past Medical History     No history of skin cancer    Psoriasis  T2DM  Diabetic neuropathy  CHF  COPD  HTN    Family History:   Sister w/ cutaneous sarcoidosis per patient report    Past Medical History, Family History, Social History, Medication List, Allergies, and Problem List were reviewed in the rooming section of Epic.     ROS: Other than symptoms mentioned in the HPI, no fevers, chills, or other skin complaints    Physical Examination     GENERAL: Well-appearing male in no acute distress, resting comfortably.  NEURO: Alert and oriented, answers questions appropriately  PSYCH: Normal mood and affect  SKIN (Focal Skin Exam): Per patient request, examination of *** was performed  {PE list:[id number]}  ***    All areas not commented on are within normal limits or unremarkable    Scribe's Attestation: Cruzita Lederer, MD obtained and performed the history, physical exam and medical decision making elements that were entered into the chart.  Signed by Catalina Antigua, Scribe, on ***.    {*** NOTE TO PROVIDER: PLEASE ADD ATTESTATION NOTING YOU AGREE WITH SCRIBE DOCUMENTATION}     (Approved Template 01/12/2020)

## 2021-11-28 ENCOUNTER — Other Ambulatory Visit: Payer: Self-pay

## 2021-11-28 ENCOUNTER — Ambulatory Visit
Admission: RE | Admit: 2021-11-28 | Discharge: 2021-11-28 | Disposition: A | Discharge status: Home / Self Care | Payer: 59 | Source: Ambulatory Visit | Attending: Physician Assistant | Admitting: Physician Assistant

## 2021-11-28 ENCOUNTER — Ambulatory Visit: Payer: 59

## 2021-11-28 VITALS — BP 112/69 | HR 79 | Temp 98.1°F | Resp 18 | Ht 70.0 in | Wt 235.0 lb

## 2021-11-28 DIAGNOSIS — S91302A Unspecified open wound, left foot, initial encounter: Secondary | ICD-10-CM

## 2021-11-28 DIAGNOSIS — G4700 Insomnia, unspecified: Secondary | ICD-10-CM | POA: Diagnosis not present

## 2021-11-28 DIAGNOSIS — G894 Chronic pain syndrome: Secondary | ICD-10-CM | POA: Diagnosis not present

## 2021-11-28 DIAGNOSIS — Z760 Encounter for issue of repeat prescription: Secondary | ICD-10-CM

## 2021-11-28 MED ORDER — HYDROXYZINE HCL 50 MG PO TABS: 50.0000 mg | ORAL_TABLET | Freq: Every evening | ORAL | 0 refills | Status: AC

## 2021-11-28 MED ORDER — EDOXABAN TOSYLATE 60 MG PO TABS: 60.0000 mg | ORAL_TABLET | Freq: Every day | ORAL | 0 refills | Status: AC

## 2021-11-28 NOTE — ED Provider Notes (Signed)
[facility]    CSN: [id number] Arrival date & time: 11/28/21  0949      History   Chief Complaint Chief Complaint  Patient presents with   Anxiety    Anxiety and unable to sleep at night due to chronic pain and open foot sore. I am diabetic so need to be extra careful. - Entered by patient   Insomnia    HPI Mario [name] is a 76 y.o. male significant past medical history of COPD, CHF, atrial fibrillation, OSA, hypertension, diabetes with diabetic neuropathy, chronic radicular lumbar pain and facet arthropathy.  Patient does go to physical therapy.  He takes gabapentin and Tylenol for pain.  Patient has tried ESI's x2 without improvement in his symptoms.  Patient unable to take NSAIDs since he is on anticoagulants.  Patient expresses extreme frustration with pain management clinic.  States he was supposed to have an appointment last week and was waiting on a phone call from the doctor but the doctor never called.  Patient says the note in his MyChart states that he did receive a call but he says he did not.  Patient says he does not want to go back to that pain management clinic.  Patient also has not reached out to the neurosurgery clinic as advised by the emergency department.  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  He also has sciatica of the right side.  He says he cannot get comfortable and his mind races.  He says melatonin does not help him.  He is not currently taking any narcotic pain medication.  States after he took the oxycodone he started to have sleeping issues but is no longer taking the medicine.  He would like something to potentially help him with sleep.  He also reports that he has a sore on his left foot that its been present for the past 1 week.  He says it started as a blister and then popped.  He says it initially hurt but it does not hurt  anymore.  He has not had any redness, swelling or drainage from the area.  He is diabetic and concerned about potential infection.  Patient also requests a refill of Savaysa, anticoagulant.  States he has had difficulty getting a hold of his cardiologist back in [place] to refill this medication.  No other concerns.  HPI  Past Medical History:  Diagnosis Date   Arthritis    hands, knees   Atrial fibrillation (HCC)    CHF (congestive heart failure) (HCC)    COPD (chronic obstructive pulmonary disease) (HCC)    Diabetes mellitus without complication (HCC)    Diabetic neuropathy (HCC)    Heart murmur    in past, mild 1969   Hypertension    resolved after weight loss and more activity   Neuromuscular disorder (HCC)    neuropathy   Neuropathy    Pulmonary embolism (HCC)    approx 2014   Shingles '99   Sleep apnea    CPAP   Wears dentures    full upper and lower    Patient Active Problem List   Diagnosis Date Noted   Spinal stenosis, lumbar region, with neurogenic claudication 10/06/2021   Sacroiliac joint pain 10/06/2021   Piriformis muscle pain 10/06/2021   Bilateral primary osteoarthritis of knee 06/11/2019   Chronic radicular lumbar pain 06/11/2019   Cervical facet joint syndrome  06/11/2019   Lumbar facet arthropathy 06/11/2019   Lumbar radiculopathy 06/11/2019   Chronic pain syndrome 06/11/2019   Lung nodule 02/09/2016   Exercise hypoxemia 12/28/2015   Chronic obstructive pulmonary disease (HCC) 03/16/2015   Nuclear sclerosis 03/16/2015   OSA (obstructive sleep apnea) 03/16/2015   Primary open angle glaucoma of both eyes, severe stage 03/16/2015   Type 2 diabetes mellitus without complication, without long-term current use of insulin (HCC) 03/16/2015   Arthralgia of left lower leg 10/29/2014   Osteoarthritis of left knee 09/10/2014   Type 2 diabetes mellitus with diabetic neuropathy (HCC) 08/04/2014   Cataract, nuclear sclerotic senile 04/01/2014   Combined forms of  age-related cataract 04/01/2014   Keratitis sicca, bilateral (HCC) 04/01/2014   Primary open angle glaucoma 02/27/2014   Flat feet 01/29/2014   Foot pain, bilateral 01/29/2014   Tinea pedis of both feet 01/29/2014   Ptosis of both eyelids 11/25/2013   Type 2 diabetes mellitus without retinopathy (HCC) 11/25/2013   Onychomycosis 11/19/2013   Chronic systolic heart failure (HCC) 11/11/2013   Emphysema of lung (HCC) 11/11/2013   Essential hypertension 11/11/2013   Screen for colon cancer 11/05/2013   Cardiomyopathy (HCC) 10/08/2013   Systolic HF (heart failure) (HCC) 10/08/2013   Acute systolic CHF (congestive heart failure) (HCC) 09/16/2013   COPD (chronic obstructive pulmonary disease) (HCC) 09/16/2013   HTN (hypertension) 09/16/2013   Obesity 09/16/2013   PAF (paroxysmal atrial fibrillation) (HCC) 09/16/2013   Senile nuclear sclerosis 05/22/2013   Muscle weakness (generalized) 11/11/2009   Long term current use of anticoagulant therapy 06/17/2009   Pulmonary embolism (HCC) 06/17/2009   ED (erectile dysfunction) of organic origin 05/13/2003    Past Surgical History:  Procedure Laterality Date   CATARACT EXTRACTION W/PHACO Left 05/12/2019   Procedure: CATARACT EXTRACTION PHACO AND INTRAOCULAR LENS PLACEMENT (IOC) LEFTDIABETIC ISTENT INJ 2.43  00:28.6;  Surgeon: Nevada Crane, MD;  Location: [facility];  Service: Ophthalmology;  Laterality: Left;  Diabetic - oral meds sleep apnea   CATARACT EXTRACTION W/PHACO Right 06/02/2019   Procedure: CATARACT EXTRACTION PHACO AND INTRAOCULAR LENS PLACEMENT (IOC) ISTENT INJ  RIGHT DIABETIC 1.31  00:25.4;  Surgeon: Nevada Crane, MD;  Location: [facility];  Service: Ophthalmology;  Laterality: Right;   COLONOSCOPY  08/30/2015   FINGER SURGERY Right    3'rd finger, right hand   KNEE SURGERY Right    screw in knee   WRIST SURGERY Left    metal removed       Home Medications    Prior to Admission medications    Medication Sig Start Date End Date Taking? Authorizing Provider  APPLE CIDER VINEGAR PO Take by mouth daily.   Yes [provider]  DULoxetine (CYMBALTA) 60 MG capsule am 11/10/18  Yes [provider]  empagliflozin (JARDIANCE) 10 MG TABS tablet Take by mouth daily.   Yes [provider]  Fluticasone-Umeclidin-Vilant (TRELEGY ELLIPTA IN) Inhale into the lungs daily.   Yes [provider]  furosemide (LASIX) 80 MG tablet Take 1 tablet by mouth 2 (two) times daily. 80 mg AM, 120 mg PM 08/23/15  Yes [provider]  gabapentin (NEURONTIN) 800 MG tablet Take 800 mg by mouth 3 (three) times daily. 06/08/19  Yes [provider]  hydrochlorothiazide (HYDRODIURIL) 12.5 MG tablet Take 1 tablet (12.5 mg total) by mouth daily. 06/07/21 11/28/21 Yes Gilles Chiquito, MD  hydrOXYzine (ATARAX) 50 MG tablet Take 1 tablet (50 mg total) by mouth at bedtime. 11/28/21 12/28/21  Yes Shirlee Latch, PA-C  metFORMIN (GLUCOPHAGE) 500 MG tablet Take 500 mg by mouth daily with breakfast. 06/08/19  Yes [provider]  OZEMPIC, 1 MG/DOSE, 2 MG/1.5ML SOPN Inject 1 mg into the skin once a week. 11/20/18  Yes [provider]  albuterol (PROVENTIL HFA;VENTOLIN HFA) 108 (90 Base) MCG/ACT inhaler Inhale 2 puffs into the lungs as needed. 11/23/15   [provider]  edoxaban (SAVAYSA) 60 MG TABS tablet Take 60 mg by mouth daily. cardiology 11/28/21 12/28/21  Eusebio Friendly B, PA-C  gabapentin (NEURONTIN) 300 MG capsule Take 800 mg by mouth 3 (three) times daily.  Patient not taking: Reported on 10/26/2021 07/17/15   [provider]  glucose blood test strip Use as instructed FOR TESTING three times daily.  E11.9 06/01/15   [provider]  ipratropium-albuterol (DUONEB) 0.5-2.5 (3) MG/3ML SOLN Inhale into the lungs. 12/28/15   [provider]  Risankizumab-rzaa (SKYRIZI) 150 MG/ML SOSY Inject into the skin.    [provider]   ROCKLATAN 0.02-0.005 % SOLN Apply 1 drop to eye at bedtime. 06/20/19   [provider]  SOTALOL AF 80 MG TABS Take by mouth 2 (two) times daily. Breakfast and dinner    [provider]  TURMERIC PO Take by mouth daily.    [provider]  losartan (COZAAR) 50 MG tablet Take 1 tablet by mouth daily. 07/05/15 11/29/18  [provider]    Family History No family history on file.  Social History Social History   Tobacco Use   Smoking status: Former    Packs/day: 0.50    Years: 1.00    Total pack years: 0.50    Types: Cigarettes    Quit date: 05/01/1969    Years since quitting: 52.6   Smokeless tobacco: Never   Tobacco comments:    Quit 1971  Vaping Use   Vaping Use: Never used  Substance Use Topics   Alcohol use: No   Drug use: No     Allergies   Rosuvastatin, Ace inhibitors, Lisinopril, and Simbrinza [brinzolamide-brimonidine]   Review of Systems Review of Systems  Constitutional:  Positive for fatigue.  Respiratory:  Negative for shortness of breath.   Cardiovascular:  Negative for chest pain.  Gastrointestinal:  Negative for abdominal pain, nausea and vomiting.  Musculoskeletal:  Positive for back pain and gait problem (increased pain with walking).  Skin:  Positive for wound. Negative for color change.  Neurological:  Negative for weakness and numbness.  Psychiatric/Behavioral:  Positive for agitation and sleep disturbance. The patient is nervous/anxious.      Physical Exam Triage Vital Signs ED Triage Vitals  Enc Vitals Group     BP      Pulse      Resp      Temp      Temp src      SpO2      Weight      Height      Head Circumference      Peak Flow      Pain Score      Pain Loc      Pain Edu?      Excl. in GC?    No data found.  Updated Vital Signs BP 112/69 (BP Location: Right Arm)   Pulse 79   Temp 98.1 F (36.7 C) (Oral)   Resp 18   Ht 5\' 10"  (1.778 m)   Wt 235 lb 0.2 oz (106.6 kg)   SpO2  100%   BMI  33.72 kg/m      Physical Exam Vitals and nursing note reviewed.  Constitutional:      General: He is not in acute distress.    Appearance: Normal appearance. He is well-developed.  HENT:     Head: Normocephalic and atraumatic.  Eyes:     General: No scleral icterus.    Conjunctiva/sclera: Conjunctivae normal.  Cardiovascular:     Rate and Rhythm: Normal rate and regular rhythm.     Heart sounds: Normal heart sounds.  Pulmonary:     Effort: Pulmonary effort is normal. No respiratory distress.     Breath sounds: Normal breath sounds.  Musculoskeletal:     Cervical back: Neck supple.     Comments: Patient sitting in wheelchair.  Reduced range of motion of back in all directions due to pain.  Tenderness throughout the lumbar region.   Skin:    General: Skin is warm and dry.     Capillary Refill: Capillary refill takes less than 2 seconds.     Comments: See image below.  Superficial ulceration of left dorsal foot.  No associated tenderness.  No swelling, erythema or drainage.  Neurological:     General: No focal deficit present.     Mental Status: He is alert. Mental status is at baseline.     Motor: No weakness.     Gait: Gait normal.  Psychiatric:        Mood and Affect: Mood normal.        Behavior: Behavior normal.       UC Treatments / Results  Labs (all labs ordered are listed, but only abnormal results are displayed) Labs Reviewed - No data to display  EKG   Radiology No results found.  Procedures Procedures (including critical care time)  Medications Ordered in UC Medications - No data to display  Initial Impression / Assessment and Plan / UC Course  I have reviewed the triage vital signs and the nursing notes.  Pertinent labs & imaging results that were available during my care of the patient were reviewed by me and considered in my medical decision making (see chart for details).  76 year old male with many comorbidities presenting for chronic lumbar  pain and insomnia related to chronic pain.  Patient has been to the emergency department for this as well as to urgent care for this in the past.  He does follow-up with PT and has been referred to pain management.    Patient's MRI from 08/27/2021 shows significant degenerative changes and facet arthropathy as well as spinal stenosis.  Patient seen by interventional pain management, Dr. Zollie Scale on 10/26/2021 for ESI.  It appears that patient did not follow back up with pain management after injection and missed appointment on 11/21/2021.  Patient reports he did not miss the appointment but was never contacted.  He does have an upcoming appointment on 12/29/2021 with pain management, but says he does not want to go back to that pain management clinic and would prefer to go somewhere else.  Patient is seen in the emergency department on 11/18/2021 and given 3-day supply of oxycodone.  Patient is presently out of the oxycodone and just taking gabapentin for pain.  Chronic lumbar pain: I had a long discussion with patient about contacting the neurosurgeon for appointment as he needs surgery consult especially since medications have not helped and neither have the ESI's.  He has the contact information says that he will contact them today.  We discussed that if the measures that have been done are not helping, the neck step may likely be surgery.  He states that he just wants to feel better.  Vies him to continue his home medications and contacted his neurosurgeon.  I did refer him to a different pain management clinic.  ER precautions reviewed. Insomnia: Sleep disturbance likely related to chronic pain that has not been managed.  Referred him to a different pain management clinic as discussed above.  Sent hydroxyzine for him to try since over-the-counter medications have not helped. Medication refill: Refilled Savaysa but advised him to contact his PCP for further refills. Ulcer of left foot: No infection at this  time.  Discussed wound care with patient and close monitoring as well as follow-up with PCP for reevaluation, especially if there are any signs of infection which we discussed.   Final Clinical Impressions(s) / UC Diagnoses   Final diagnoses:  Insomnia, unspecified type  Chronic pain syndrome  Open wound of left foot, initial encounter  Medication refill     Discharge Instructions      -Continue home meds.  -Call Neurosurgery for appointment.  -Referred you to a different pain management clinic. They should contact you in the next 1 week.  -Try hydroxyzine for sleep.  -Clean wound with soap and water and put Neosporin and bandage on area. If it gets deeper, red, swollen, drains pus, you need to be seen again immediately. Does not look to be infected now but keep a close eye on the wound.      ED Prescriptions     Medication Sig Dispense Auth. Provider   hydrOXYzine (ATARAX) 50 MG tablet Take 1 tablet (50 mg total) by mouth at bedtime. 30 tablet Danton Clap, PA-C   edoxaban (SAVAYSA) 60 MG TABS tablet Take 60 mg by mouth daily. cardiology 30 tablet Danton Clap, PA-C      PDMP not reviewed this encounter.   Danton Clap, PA-C 11/28/21 1130

## 2021-11-28 NOTE — Discharge Instructions (Addendum)
-  Continue home meds.  -Call Neurosurgery for appointment.  -Referred you to a different pain management clinic. They should contact you in the next 1 week.  -Try hydroxyzine for sleep.  -Clean wound with soap and water and put Neosporin and bandage on area. If it gets deeper, red, swollen, drains pus, you need to be seen again immediately. Does not look to be infected now but keep a close eye on the wound.

## 2021-11-28 NOTE — ED Triage Notes (Signed)
Pt c/o anxiety and insomnia. Started about 2 weeks ago. He states he started taking percocet about the same time that it started. Pt has not tried to contact PCP about this. He states he does not have time to call his PCP to be told to wait 2 weeks for an appt. He also states he has a sore on his left foot. He states he is diabetic and concerned about it.

## 2021-11-29 NOTE — Therapy (Signed)
OUTPATIENT PHYSICAL THERAPY TREATMENT NOTE   Patient Name: Mario [name] MRN: [id number] DOB:August 22, 1945, 76 y.o., male Today's Date: 11/30/2021   PT End of Session - 11/30/21 0759     Visit Number 5    Number of Visits 16    Date for PT Re-Evaluation 12/19/21    Authorization Type 5/10 eval 6/26    PT Start Time 0800    PT Stop Time 0844    PT Time Calculation (min) 44 min    Equipment Utilized During Treatment Gait belt    Activity Tolerance Patient tolerated treatment well    Behavior During Therapy WFL for tasks assessed/performed                 Past Medical History:  Diagnosis Date   Arthritis    hands, knees   Atrial fibrillation (HCC)    CHF (congestive heart failure) (HCC)    COPD (chronic obstructive pulmonary disease) ([place])    Diabetes mellitus without complication ([place])    Diabetic neuropathy ([place])    Heart murmur    in past, mild 1969   Hypertension    resolved after weight loss and more activity   Neuromuscular disorder ([place])    neuropathy   Neuropathy    Pulmonary embolism ([place])    approx 2014   Shingles '99   Sleep apnea    CPAP   Wears dentures    full upper and lower   Past Surgical History:  Procedure Laterality Date   CATARACT EXTRACTION W/PHACO Left 05/12/2019   Procedure: CATARACT EXTRACTION PHACO AND INTRAOCULAR LENS PLACEMENT ([place]) LEFTDIABETIC ISTENT INJ 2.43  00:28.6;  Surgeon: Eulogio Bear, MD;  Location: [facility];  Service: Ophthalmology;  Laterality: Left;  Diabetic - oral meds sleep apnea   CATARACT EXTRACTION W/PHACO Right 06/02/2019   Procedure: CATARACT EXTRACTION PHACO AND INTRAOCULAR LENS PLACEMENT ([facility]) ISTENT INJ  RIGHT DIABETIC 1.31  00:25.4;  Surgeon: Eulogio Bear, MD;  Location: [facility];  Service: Ophthalmology;  Laterality: Right;   COLONOSCOPY  08/30/2015   FINGER SURGERY Right    3'rd finger, right hand   KNEE SURGERY Right    screw in knee   WRIST SURGERY Left    metal removed    Patient Active Problem List   Diagnosis Date Noted   Spinal stenosis, lumbar region, with neurogenic claudication 10/06/2021   Sacroiliac joint pain 10/06/2021   Piriformis muscle pain 10/06/2021   Bilateral primary osteoarthritis of knee 06/11/2019   Chronic radicular lumbar pain 06/11/2019   Cervical facet joint syndrome 06/11/2019   Lumbar facet arthropathy 06/11/2019   Lumbar radiculopathy 06/11/2019   Chronic pain syndrome 06/11/2019   Lung nodule 02/09/2016   Exercise hypoxemia 12/28/2015   Chronic obstructive pulmonary disease ([place]) 03/16/2015   Nuclear sclerosis 03/16/2015   OSA (obstructive sleep apnea) 03/16/2015   Primary open angle glaucoma of both eyes, severe stage 03/16/2015   Type 2 diabetes mellitus without complication, without long-term current use of insulin ([place]) 03/16/2015   Arthralgia of left lower leg 10/29/2014   Osteoarthritis of left knee 09/10/2014   Type 2 diabetes mellitus with diabetic neuropathy ([place]) 08/04/2014   Cataract, nuclear sclerotic senile 04/01/2014   Combined forms of age-related cataract 04/01/2014   Keratitis sicca, bilateral ([place]) 04/01/2014   Primary open angle glaucoma 02/27/2014   Flat feet 01/29/2014   Foot pain, bilateral 01/29/2014   Tinea pedis of both feet 01/29/2014   Ptosis of both eyelids 11/25/2013   Type  2 diabetes mellitus without retinopathy (HCC) 11/25/2013   Onychomycosis 11/19/2013   Chronic systolic heart failure (HCC) 11/11/2013   Emphysema of lung (HCC) 11/11/2013   Essential hypertension 11/11/2013   Screen for colon cancer 11/05/2013   Cardiomyopathy (HCC) 10/08/2013   Systolic HF (heart failure) (HCC) 10/08/2013   Acute systolic CHF (congestive heart failure) (HCC) 09/16/2013   COPD (chronic obstructive pulmonary disease) (HCC) 09/16/2013   HTN (hypertension) 09/16/2013   Obesity 09/16/2013   PAF (paroxysmal atrial fibrillation) (HCC) 09/16/2013   Senile nuclear sclerosis 05/22/2013   Muscle weakness  (generalized) 11/11/2009   Long term current use of anticoagulant therapy 06/17/2009   Pulmonary embolism (HCC) 06/17/2009   ED (erectile dysfunction) of organic origin 05/13/2003    PCP: Maree Erie MD  REFERRING PROVIDER: Filomena Jungling I MD   REFERRING DIAG: back and LE pain   Rationale for Evaluation and Treatment Rehabilitation  THERAPY DIAG:  No diagnosis found.  ONSET DATE: about 3 months ago.   SUBJECTIVE:                                                                                                                                                                                           SUBJECTIVE STATEMENT: Patient reports he has not slept in 3 days. Took medication for it but it did not work.   PERTINENT HISTORY:  Patient presents to physical therapy for low back and LE pain. PMH includes: arthritis, a fib, CHF, COPD, DM, neuropathy, heart murmur, HTN, PE, shingles, spinal stenosis, SI pain, bilateral OA of knees, lumbar facet arthropathy, lumbar radiculopathy, chronic pain syndrome, lung nodule, exercise hypoxemia, glaucoma of bilateral eyes, flat feet, onychomycosis, PAF. Per patient notes patient was recommended surgery by orthopedics but patient declined. Since patient was seen last in this clinic he has been in the ED three times. Patient reports his sciatic pain has been worsening in the past three months.   PAIN:  Are you having pain? Yes: NPRS scale: 6-7/10 Pain location: low back, buttock Pain description: radiating Aggravating factors: walking, moving Relieving factors: rest   PRECAUTIONS: Fall  WEIGHT BEARING RESTRICTIONS No  FALLS:  Has patient fallen in last 6 months? Yes. Number of falls 1  LIVING ENVIRONMENT: Lives with: lives with their family, lives with their spouse, and lives with their partner Lives in: House/apartment Stairs: Yes: Internal: flight steps; can reach both Has following equipment at home: None  OCCUPATION:  retired  PLOF: Independent with basic ADLs  PATIENT GOALS to have less pain and be able to walk again    OBJECTIVE: all objective information taken at eval unless otherwise indicated  DIAGNOSTIC FINDINGS:  MRI 08/27/21: L3-4: Mild chronic degenerative changes without visible neural compression or progression.   L4-5: Disc bulge. Facet degeneration and hypertrophy. Stenosis of the lateral recesses and foramina could possibly cause neural compression. Foraminal stenosis slightly worse on the right. Slight worsening since 2019. Small synovial cyst previously seen is no longer visible.   L5-S1: Disc and facet degeneration. Bilateral foraminal stenosis could compress either or both L5 nerves. Similar appearance to the prior study.    LUMBAR ROM:   Active  A/PROM  eval  Flexion 41  Extension -4 from neutral*   Right lateral flexion 10 *  Left lateral flexion 10  Right rotation 6  Left rotation 8   (Blank rows = not tested) *pain    LOWER EXTREMITY MMT:    MMT Right eval Left eval  Hip flexion 3+ 3+  Hip extension 2 2  Hip abduction 3- 3-  Hip adduction 3- 3-  Hip internal rotation 2+ 2+  Hip external rotation 2+ 2+  Knee flexion 2+ 2+  Knee extension 3+ 3+  Ankle dorsiflexion 3 3  Ankle plantarflexion 3 3    TODAY'S TREATMENT   Manual:  Hamstring lengthening with leg on PT arm 45 seconds each le Single knee to chest 30 seconds each LE Figure four stretch 30 seconds each LE Sciatic nerve glide with leg on PT arm 20x each LE Roller to bilateral IT band x 3 minutes   TherEx:  Supine:      SAQ over bolster 10x 3 second holds each LE; x 2 sets Adduction ball squeeze 12   Posterior pelvic tilt 12x 5 seconds    Posterior pelvic tilt with adduction ball squeeze 12x   Single leg abduction, exhale to return to neutral for core activation 10x each LE: x 2 sets    March with TrA tightening 10x each LE   Adduction ball squeeze 15x 3 second holds    GTB  abduction 20x   Seated posterior pelvic tilt with TrA activation 10x 3 second holds   PATIENT EDUCATION:  Education details: Pt educated throughout session about proper posture and technique with exercises. Improved exercise technique, movement at target joints, use of target muscles after min to mod verbal, visual, tactile cues.  Person educated: Patient Education method: Explanation, Demonstration, Tactile cues, and Verbal cues Education comprehension: verbalized understanding, returned demonstration, verbal cues required, tactile cues required, and needs further education   HOME EXERCISE PROGRAM:   No updates 11/14/2021 Access Code: [email or web address] URL: [email or web address] Date: 10/24/2021 Prepared by: Precious Bard  Exercises - Supine Lower Trunk Rotation  - 1 x daily - 7 x weekly - 2 sets - 10 reps - 5 hold - Supine Posterior Pelvic Tilt  - 1 x daily - 7 x weekly - 2 sets - 10 reps - 5 hold - Supine Figure 4 Piriformis Stretch  - 1 x daily - 7 x weekly - 2 sets - 10 reps - 5 hold - Seated Hamstring Stretch  - 1 x daily - 7 x weekly - 2 sets - 2 reps - 30 hold  ASSESSMENT:  CLINICAL IMPRESSION: Patient is very verbose and tangential during session requiring frequent re-direct. He has limited strength of BLE and core which will continue to be an area of progress.  The patient will benefit from skilled physical therapy to decrease pain, improve mobility, and increase strength for improved quality of life.    OBJECTIVE IMPAIRMENTS Abnormal gait, cardiopulmonary status limiting activity, decreased activity  tolerance, decreased balance, decreased coordination, decreased endurance, decreased mobility, difficulty walking, decreased ROM, decreased strength, hypomobility, impaired perceived functional ability, increased muscle spasms, impaired flexibility, improper body mechanics, postural dysfunction, and pain.   ACTIVITY LIMITATIONS carrying, lifting, bending, sitting,  standing, squatting, sleeping, stairs, transfers, bed mobility, bathing, toileting, dressing, reach over head, hygiene/grooming, and locomotion level  PARTICIPATION LIMITATIONS: meal prep, cleaning, laundry, driving, shopping, community activity, and yard work  PERSONAL FACTORS Age, Fitness, Past/current experiences, Time since onset of injury/illness/exacerbation, Transportation, and 3+ comorbidities: arthritis, a fib, CHF, COPD, DM, neuropathy, heart murmur, HTN, PE, shingles, spinal stenosis, SI pain, bilateral OA of knees, lumbar facet arthropathy, lumbar radiculopathy, chronic pain syndrome, lung nodule, exercise hypoxemia, glaucoma of bilateral eyes, flat feet, onychomycosis, PAF  are also affecting patient's functional outcome.   REHAB POTENTIAL: Fair    CLINICAL DECISION MAKING: Evolving/moderate complexity  EVALUATION COMPLEXITY: Moderate   GOALS: Goals reviewed with patient? Yes  SHORT TERM GOALS: Target date: 11/21/2021  Patient will be independent in home exercise program to improve strength/mobility for better functional independence with ADLs. Baseline: 6/26: Hep given Goal status: INITIAL  2.  Patient will report a worst pain of 5/10 on VAS in   back and LE's to improve tolerance with ADLs and reduced symptoms with activities.  Baseline: 6/26: 10/10 Goal status: INITIAL LONG TERM GOALS: Target date: 12/19/2021  Patient will increase FOTO score to equal to or greater than  53   to demonstrate statistically significant improvement in mobility and quality of life.  Baseline: 6/26: 41% Goal status: INITIAL  2.  Patient will report a worst pain of 3/10 on VAS in  low back and LEs  to improve tolerance with ADLs and reduced symptoms with activities.  Baseline:6/26: 10/10  Goal status: INITIAL  3.  Patient (> 71 years old) will complete five times sit to stand test in < 15 seconds indicating an increased LE strength and improved balance. Baseline: 6/26: 21.56 seconds with  heavy BUE support Goal status: INITIAL  4.   Patient will reduce modified Oswestry score to <20 as to demonstrate minimal disability with ADLs including improved sleeping tolerance, walking/sitting tolerance etc for better mobility with ADLs Baseline: 6/26: 60%  Goal status: INITIAL  5.  Patient will increase 10 meter walk test to >1.74m/s as to improve gait speed for better community ambulation and to reduce fall risk. Baseline: 6/26: unable to ambulate distance Goal status: INITIAL    PLAN: PT FREQUENCY: 2x/week  PT DURATION: 8 weeks  PLANNED INTERVENTIONS: Therapeutic exercises, Therapeutic activity, Neuromuscular re-education, Balance training, Gait training, Patient/Family education, Joint manipulation, Joint mobilization, Stair training, DME instructions, Dry Needling, Spinal mobilization, Cryotherapy, Moist heat, Taping, Traction, Ultrasound, Ionotophoresis 4mg /ml Dexamethasone, and Manual therapy.  PLAN FOR NEXT SESSION: core stabilization, mobilization, LE lengthening.     Janna Arch, PT, DPT  11/30/2021, 8:02 AM

## 2021-11-29 NOTE — Unmapped (Unsigned)
[facility]  Established Patient Clinic Note    Assessment/Plan:   Problem List Items Addressed This Visit    None      HEALTH MAINTENANCE ITEMS STILL DUE:  Health Maintenance Due   Topic Date Due    COPD Spirometry  Never done    Retinal Eye Exam  Never done    Zoster Vaccines (1 of 2) Never done    DTaP/Tdap/Td Vaccines (1 - Tdap) 10/31/2006    AAA Screening  Never done       PHQ-2 Score:      PHQ-9 Score:      [name] Score:      {select_status_or_delete_smartlist:64641}    Subjective   Mr. Maceda is a 76 y.o. male  coming to clinic today for the following issues:    No chief complaint on file.      HPI    ***    Forrester Cotrell is a 76 y.o. male, with the following medical problems as documented above:      ROS    I have reviewed the problem list, medications, and allergies and have updated/reconciled them if needed.    Mr. Brems  reports that he quit smoking about 52 years ago. His smoking use included cigarettes. He smoked an average of .5 packs per day. He has never used smokeless tobacco.    Objective     VITALS: There were no vitals taken for this visit.    Wt Readings from Last 6 Encounters:   09/22/21 (!) 108 kg (238 lb)   09/14/21 (!) 107.5 kg (237 lb)   06/24/21 (!) 107.6 kg (237 lb 3.2 oz)        Physical Exam    LABS/IMAGING  I have reviewed pertinent recent labs and imaging in Epic    Veatrice Kells, MD  [facility]  [facility]   [address] [place], [place] [address]  Telephone [phone]  Fax [phone]

## 2021-11-30 ENCOUNTER — Ambulatory Visit
Admit: 2021-11-30 | Discharge: 2021-12-01 | Payer: MEDICAID | Attending: Nurse Practitioner | Primary: Nurse Practitioner

## 2021-11-30 ENCOUNTER — Ambulatory Visit: Payer: 59 | Attending: Physical Medicine & Rehabilitation

## 2021-11-30 DIAGNOSIS — G4700 Insomnia, unspecified: Principal | ICD-10-CM

## 2021-11-30 DIAGNOSIS — M6281 Muscle weakness (generalized): Secondary | ICD-10-CM | POA: Insufficient documentation

## 2021-11-30 DIAGNOSIS — R269 Unspecified abnormalities of gait and mobility: Secondary | ICD-10-CM | POA: Diagnosis present

## 2021-11-30 DIAGNOSIS — R262 Difficulty in walking, not elsewhere classified: Secondary | ICD-10-CM | POA: Diagnosis present

## 2021-11-30 DIAGNOSIS — M5459 Other low back pain: Secondary | ICD-10-CM | POA: Diagnosis present

## 2021-11-30 MED ORDER — LORAZEPAM 1 MG TABLET
ORAL_TABLET | 0 refills | 0 days | Status: CP
Start: 2021-11-30 — End: ?

## 2021-12-01 ENCOUNTER — Ambulatory Visit: Admit: 2021-12-01 | Discharge: 2021-12-02 | Payer: MEDICAID

## 2021-12-01 DIAGNOSIS — F39 Unspecified mood [affective] disorder: Principal | ICD-10-CM

## 2021-12-01 DIAGNOSIS — M5430 Sciatica, unspecified side: Principal | ICD-10-CM

## 2021-12-01 MED ORDER — TIZANIDINE 2 MG TABLET
ORAL_TABLET | Freq: Three times a day (TID) | ORAL | 2 refills | 10 days | Status: CP | PRN
Start: 2021-12-01 — End: 2022-03-01

## 2021-12-01 MED ORDER — EDOXABAN 60 MG TABLET
ORAL_TABLET | Freq: Every day | ORAL | 3 refills | 90 days | Status: CP
Start: 2021-12-01 — End: 2022-12-01

## 2021-12-01 MED ORDER — FUROSEMIDE 80 MG TABLET
ORAL_TABLET | Freq: Every day | ORAL | 3 refills | 90 days | Status: CP
Start: 2021-12-01 — End: 2022-12-01

## 2021-12-01 MED ORDER — CELECOXIB 200 MG CAPSULE
ORAL_CAPSULE | Freq: Two times a day (BID) | ORAL | 2 refills | 45 days | Status: CP | PRN
Start: 2021-12-01 — End: 2022-12-01

## 2021-12-01 MED ORDER — DULOXETINE 60 MG CAPSULE,DELAYED RELEASE
ORAL_CAPSULE | Freq: Every day | ORAL | 3 refills | 90 days | Status: CP
Start: 2021-12-01 — End: 2022-12-01

## 2021-12-01 NOTE — Unmapped (Unsigned)
Progress Note    Name:  Paul Velez  DOB: 1946/01/18  Today's Date: 11/30/2021  Age:  76 y.o.    Assessment/Plan:   Diagnoses and all orders for this visit:    Insomnia, unspecified type  He understands this is a complicated issue. Will try one time dose of Ativan for tonight to help with acute anxiety. He has appointment here tomorrow with PCP.   -     LORazepam (ATIVAN) 1 MG tablet; 1 po tonight for sleep #1    HPI:  Semaj Velez is a 76 y.o. male presents for acute insomnia for a few weeks associated with feeling anxious and agitated. He is unable to fall asleep. Recent trial of Hydroxyzine 50 mg did not help at all. Tylenol PM and Melatonin have not helped. He is also not using his CPAP machine. He denies ETOH. Here with fiance.     Review of Systems    Allergies   Allergen Reactions    Rosuvastatin Anaphylaxis     felt bad    Ace Inhibitors Anxiety    Lisinopril Other (See Comments)     Other reaction(s): Mental Status Change, Cough    Brinzolamide-Brimonidine Itching     And burning  And burning           Current Outpatient Medications:     albuterol sulfate 90 mcg/actuation AePB, Inhale., Disp: , Rfl:     amLODIPine (NORVASC) 5 MG tablet, Take 1 tablet (5 mg total) by mouth daily., Disp: , Rfl:     atorvastatin (LIPITOR) 20 MG tablet, , Disp: , Rfl:     celecoxib (CELEBREX) 200 MG capsule, , Disp: , Rfl:     clobetasoL (TEMOVATE) 0.05 % ointment, Apply topically Two (2) times a day. Use on psoriasis until smooth., Disp: 60 g, Rfl: 1    DULoxetine (CYMBALTA) 60 MG capsule, , Disp: , Rfl:     edoxaban (SAVAYSA) 60 mg tablet, Take 1 tablet (60 mg total) by mouth daily., Disp: , Rfl:     empagliflozin (JARDIANCE) 10 mg tablet, Take 1 tablet (10 mg total) by mouth daily., Disp: 90 tablet, Rfl: 3    empty container Misc, Use as directed to dispose of [employer]., Disp: 1 each, Rfl: 2    furosemide (LASIX) 80 MG tablet, Take 1 tablet (80 mg total) by mouth daily., Disp: , Rfl:     gabapentin (NEURONTIN) 800 MG tablet, Take 1 tablet (800 mg total) by mouth Three (3) times a day., Disp: , Rfl:     hydroCHLOROthiazide (HYDRODIURIL) 12.5 MG tablet, , Disp: , Rfl:     losartan (COZAAR) 50 MG tablet, Take 1 tablet (50 mg total) by mouth daily., Disp: , Rfl:     metFORMIN (GLUCOPHAGE) 1000 MG tablet, Take 0.5 tablets (500 mg total) by mouth in the morning and 0.5 tablets (500 mg total) in the evening. Take with meals. 500 mg in the am and 500 mg in the evening ., Disp: , Rfl:     ONETOUCH VERIO TEST STRIPS Strp, TEST TWICE DAILY, Disp: , Rfl:     risankizumab-rzaa (SKYRIZI) 150 mg/mL PnIj, Inject the contents of 1 pen (150mg ) under the skin at weeks 0 and 4 as loading doses., Disp: 2 mL, Rfl: 0    risankizumab-rzaa 150 mg/mL PnIj, Inject the contents of 1 pen (150 mg total) under the skin Every three (3) months., Disp: 2 mL, Rfl: 1    semaglutide (OZEMPIC) 1 mg/dose (4 mg/3 mL) PnIj  injection, Inject under the skin every seven (7) days., Disp: , Rfl:     sotalol (BETAPACE) 80 MG tablet, Take 1 tablet (80 mg total) by mouth Two (2) times a day., Disp: , Rfl:     tamsulosin (FLOMAX) 0.4 mg capsule, Take 1 capsule (0.4 mg total) by mouth daily., Disp: 90 capsule, Rfl: 3    TRELEGY ELLIPTA 100-62.5-25 mcg inhaler, , Disp: , Rfl:     umeclidinium (INCRUSE ELLIPTA) 62.5 mcg/actuation inhaler, Inhale 1 puff daily., Disp: , Rfl:     LORazepam (ATIVAN) 1 MG tablet, 1 po tonight for sleep, Disp: 1 tablet, Rfl: 0    Past Medical History:   Diagnosis Date    Allergic     CHF (congestive heart failure) (CMS-HCC)     COPD (chronic obstructive pulmonary disease) (CMS-HCC)     Diabetes type 2, controlled (CMS-HCC)     HLD (hyperlipidemia)     HTN (hypertension)     Neuropathy     OA (osteoarthritis) of knee     PAF (paroxysmal atrial fibrillation) (CMS-HCC)     Sarcoidosis        Physical Examination:  BP 140/70  - Pulse 83  - Temp 37 ??C (98.6 ??F)  - Resp 20  - Wt (!) 107.5 kg (237 lb)  - SpO2 94%  - BMI 35.00 kg/m??     Physical Exam  Vitals reviewed.   Constitutional:       Comments: Chronically ill appearing. In wheel chair, portable O 2 [place]   Neurological:      Mental Status: He is oriented to person, place, and time.   Psychiatric:         Mood and Affect: Mood normal.         Behavior: Behavior normal.               Labs:  No results found for this or any previous visit (from the past 24 hour(s)).    Paul Silvius, NP  11/30/2021

## 2021-12-01 NOTE — Unmapped (Signed)
//  Back Pain likely due to degenerative disc disease without herniated disc  - Referral to Physical Therapy; exercises provided in AVS. Consider Spine Center Refer.   - Pain relief with Tylenol, NSAIDs, Lidoderm, Voltaren, heat/ice, muscle relaxer.   - Recommended light activity as tolerated.   - Encouraged back support with prolonged sitting or standing.

## 2021-12-01 NOTE — Unmapped (Addendum)
Sleep Hygiene Recommendations    1. Remember that having a bad night of sleep is not harmful. You might feel uncomfortable and irritable the next day, but it's not a disaster. Focus on fixing the factors you have control over and trying again the next night! Don't fall into the endless chase of energy drinks/naps to fight the tired from the night before and inhibit the sleep the following night.     2. Keep in mind that we don't all need 8 hours of sleep every night. Different individuals have different sleep needs. Sleep quality is more important than quantity.    3. Don't get into bed until you're sleepy, meaning you're have difficulty keeping your eyes open. This is different from feeling fatigued or having little energy. Lie down intending to go to sleep only when you are sleepy.    4. If you're in bed and haven't fallen asleep after 15-20 minutes, get out of bed.  Do a quiet activity such as reading, listening to music, or doing puzzles. Try to avoid exposure to bright light from TVs/computers/phones. Return to bed only after becoming sleepy. If you return to bed but still can't fall asleep after 15-20 minutes, repeat the above step. Do this as often as necessary.    5. Use your bed for sleep and sex only. It's important to make a strong connection in your mind between the bed and sleep. Using your bed for other activities such as watching TV, reading, or worrying will loosen this connection and make it more difficult for you to get to sleep.    6. Try to get up at the same time every morning no matter how well you slept the night before. This will be difficult, but will help build up your desire to sleep, which is just like hunger--the longer you go without sleep, the more your body craves it. Turn on the lights or walk outside in the sunlight when you wake up in the morning. I use a daylight alarm to simulate light when waking up.     7. Avoid taking naps. It's tempting to take a nap during the day when you haven't slept well the night before, but this will reduce your desire to sleep and make it more difficult for you to sleep at night. If necessary to take a nap for safety (e.g., to prevent falling asleep while driving), try to limit the naps to about 30 minutes in duration.    8. Exercise regularly. This will promote better sleep quality and overall health. Exercising in the morning or late afternoon will tire your body and help you sleep, but exercising in the late evening will stimulate your body and make sleeping more difficult.     9. Avoid caffeine, nicotine, and alcohol, particularly in the evenings. Avoid caffeine and refined sugars in the afternoon, and if your are sensitive, avoid them entirely. Foods like chocolate, coffee, tea and soda will inhibit sleep.  These disrupt your sleep and reduce the amount of 'deep sleep' one obtains each night. Tobacco and alcohol should be avoided since these disrupt sleep. Alcohol may make you tired, but it will disrupt your normal sleep stages and leave you feeling tired the following day, regardless of the time spent asleep.     10. Develop a pre-bedtime routine to prepare your body for sleep. Eat your evening meals at least two hours prior to going to bed. Large meals or hunger can inhibit sleep. Try to avoid work-related activities and  light from TVs/phones/tablets. Make your bedroom quiet, dark, and slightly cool. Remove  pets from the bedroom and use earplugs/eye masks. If anxiety keeps you up, ask your provider about relaxation techniques to help.    11. Relax in the evening before going to bed. Try not to rehash the day???s problems. I use 3 good things to remind myself of the positive occurences in my day, do a session of breath work (deep curricular rhythmic breathing), and then put on white noise or a sleep scape from Headspace or Calm.     Helpful Applications that offer Meditations, Dietitian Therapy (CBT): Calm, Headspace, Sleepio, Somly and Sleep Coach

## 2021-12-01 NOTE — Unmapped (Signed)
//   Hx of Depression and Anxiety:   - Last GAD 7:      and percent change: GAD 7 change: 0  - Last PHQ9:    - Currently taking: None; during transition had run out of Cymbalta and forgot why he takes it. Recurrent symptoms of depression, anxiety, claustrophobia, and diabetic neuropathy. Plan to restart Cymbatla and treat pain as above. If no better, consider remeron and cross taper to new primary agent.

## 2021-12-02 NOTE — Unmapped (Signed)
Patients sig other called with complaints of possible medication reaction with new medications    New medication Cymbalta worked for a little bit, was able to sleep for about 3 hours.    Cannot sit in chair more than 30 min with out having to get up. Still very hard to walk.  States his pain level wakes him up at night.     How long to try the Remeron?    Spine center referral?

## 2021-12-05 ENCOUNTER — Ambulatory Visit: Payer: 59

## 2021-12-05 NOTE — Unmapped (Signed)
Cymbalta can take 3-6 weeks to reach its full effect. I would give it a little more time, call their pain management doc for injections and if symptoms are still not better we can try to cross taper to Remeron.

## 2021-12-05 NOTE — Unmapped (Signed)
Left voicemail for the patient to call back when able.

## 2021-12-05 NOTE — Unmapped (Signed)
Patient contacted and given message. Patient verbalized understanding and had no further questions at this time. He will contact pain management.

## 2021-12-07 ENCOUNTER — Ambulatory Visit: Payer: 59

## 2021-12-07 NOTE — Unmapped (Signed)
PA requested through his insurance. Ref number [id number]. Pending review, will receive notification via fax if approved. 72 hour wait.

## 2021-12-08 ENCOUNTER — Telehealth: Payer: Self-pay

## 2021-12-08 ENCOUNTER — Ambulatory Visit
Payer: 59 | Attending: Student in an Organized Health Care Education/Training Program | Admitting: Student in an Organized Health Care Education/Training Program

## 2021-12-08 ENCOUNTER — Encounter: Payer: Self-pay | Admitting: Student in an Organized Health Care Education/Training Program

## 2021-12-08 VITALS — BP 121/68 | HR 83 | Temp 97.5°F | Resp 18 | Ht 71.0 in | Wt 237.0 lb

## 2021-12-08 DIAGNOSIS — F321 Major depressive disorder, single episode, moderate: Secondary | ICD-10-CM | POA: Insufficient documentation

## 2021-12-08 DIAGNOSIS — G894 Chronic pain syndrome: Secondary | ICD-10-CM | POA: Diagnosis present

## 2021-12-08 DIAGNOSIS — M5416 Radiculopathy, lumbar region: Secondary | ICD-10-CM | POA: Diagnosis present

## 2021-12-08 DIAGNOSIS — G8929 Other chronic pain: Secondary | ICD-10-CM | POA: Insufficient documentation

## 2021-12-08 DIAGNOSIS — M48062 Spinal stenosis, lumbar region with neurogenic claudication: Secondary | ICD-10-CM | POA: Diagnosis present

## 2021-12-08 NOTE — Progress Notes (Signed)
PROVIDER NOTE: Information contained herein reflects review and annotations entered in association with encounter. Interpretation of such information and data should be left to medically-trained personnel. Information provided to patient can be located elsewhere in the medical record under "Patient Instructions". Document created using STT-dictation technology, any transcriptional errors that may result from process are unintentional.    Patient: Mario [name]  Service Category: E/M  Provider: Gillis Santa, MD  DOB: January 05, 1946  DOS: 12/08/2021  Referring Provider: Delma Freeze, MD  MRN: [id number]  Specialty: Interventional Pain Management  PCP: Delma Freeze, MD  Type: Established Patient  Setting: Ambulatory outpatient    Location: Office  Delivery: Face-to-face     HPI  [name], a 76 y.o. 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 [name] primary complain today is Leg Pain (buttocks) Last encounter: My last encounter with him was on 11/21/2021. Pertinent problems: [name] has Chronic radicular lumbar pain; Cervical facet joint syndrome; Lumbar facet arthropathy; Lumbar radiculopathy; Chronic pain syndrome; Spinal stenosis, lumbar region, with neurogenic claudication; Sacroiliac joint pain; and Piriformis muscle pain on their pertinent problem list. Pain Assessment: Severity of Chronic pain is reported as a 10-Worst pain ever/10. Location: Leg Right, Left/radiates from buttocks down legs to feet. Onset: More than a month ago. Quality: Aching, Constant, Dull. Timing: Constant. Modifying factor(s): rest. Vitals:  height is '5\' 11"'  (1.803 m) and weight is 237 lb (107.5 kg). His temperature is 97.5 F (36.4 C) (abnormal). His blood pressure is 121/68 and his pulse is 83. His respiration is 18 and oxygen saturation is 90%.   Reason for encounter: post-procedure evaluation and assessment.    Post-procedure  evaluation   Type: Lumbar epidural steroid injection (LESI) (interlaminar) #1    Laterality: Right   Level:  L4-5 Level.  Imaging: Fluoroscopic guidance Anesthesia: Local anesthesia (1-2% Lidocaine) Anxiolysis: None                 Sedation: None. DOS: 10/26/2021  Performed by: Gillis Santa, MD  Purpose: Diagnostic/Therapeutic Indications: Lumbar radicular pain of intraspinal etiology of more than 4 weeks that has failed to respond to conservative therapy and is severe enough to impact quality of life or function. 1. Spinal stenosis, lumbar region, with neurogenic claudication   2. Chronic radicular lumbar pain   3. Chronic pain syndrome    NAS-11 Pain score:   Pre-procedure: 6 /10   Post-procedure: 0-No pain/10      Effectiveness:  Initial hour after procedure: 100 % . Subsequent 4-6 hours post-procedure: 100 %  Analgesia past initial 6 hours: 100% for 24 hs Ongoing improvement:  Analgesic:  <20%    ROS  Constitutional: Denies any fever or chills Gastrointestinal: No reported hemesis, hematochezia, vomiting, or acute GI distress Musculoskeletal:  Low back pain with radiation into right lower extremity Neurological: No reported episodes of acute onset apraxia, aphasia, dysarthria, agnosia, amnesia, paralysis, loss of coordination, or loss of consciousness  Medication Review  Apple Cider Vinegar, DULoxetine, Fluticasone-Umeclidin-Vilant, LORazepam, Netarsudil-Latanoprost, Risankizumab-rzaa, SOTALOL AF, Semaglutide (1 MG/DOSE), Turmeric, albuterol, edoxaban, empagliflozin, furosemide, gabapentin, glucose blood, hydrOXYzine, hydrochlorothiazide, ipratropium-albuterol, losartan, and metFORMIN  History Review  Allergy: [name] is allergic to rosuvastatin, ace inhibitors, lisinopril, and simbrinza [brinzolamide-brimonidine]. Drug: [name]  reports no history of drug use. Alcohol:  reports no history of alcohol use. Tobacco:  reports that he quit smoking about 52 years ago. His  smoking use included cigarettes. He has a  0.50 pack-year smoking history. He has never used smokeless tobacco. Social: [name]  reports that he quit smoking about 52 years ago. His smoking use included cigarettes. He has a 0.50 pack-year smoking history. He has never used smokeless tobacco. He reports that he does not drink alcohol and does not use drugs. Medical:  has a past medical history of Arthritis, Atrial fibrillation ([place]), CHF (congestive heart failure) ([place]), COPD (chronic obstructive pulmonary disease) ([place]), Diabetes mellitus without complication ([place]), Diabetic neuropathy ([place]), Heart murmur, Hypertension, Neuromuscular disorder ([place]), Neuropathy, Pulmonary embolism ([place]), Shingles ('99), Sleep apnea, and Wears dentures. Surgical: [name]  has a past surgical history that includes Wrist surgery (Left); Knee surgery (Right); Colonoscopy (08/30/2015); Cataract extraction w/PHACO (Left, 05/12/2019); Finger surgery (Right); and Cataract extraction w/PHACO (Right, 06/02/2019). Family: family history is not on file.  Laboratory Chemistry Profile   Renal Lab Results  Component Value Date   BUN 22 06/07/2021   CREATININE 1.29 (H) 06/07/2021   BCR 20 02/15/2016   GFRAA 69 02/15/2016   GFRNONAA 58 (L) 06/07/2021    Hepatic Lab Results  Component Value Date   ALBUMIN 4.3 02/15/2016    Electrolytes Lab Results  Component Value Date   NA 138 06/07/2021   K 4.8 06/07/2021   CL 103 06/07/2021   CALCIUM 8.6 (L) 06/07/2021   PHOS 3.2 02/15/2016    Bone No results found for: "VD25OH", "VD125OH2TOT", "NT6144RX5", "QM0867YP9", "25OHVITD1", "25OHVITD2", "25OHVITD3", "TESTOFREE", "TESTOSTERONE"  Inflammation (CRP: Acute Phase) (ESR: Chronic Phase) No results found for: "CRP", "ESRSEDRATE", "LATICACIDVEN"       Note: Above Lab results reviewed.  Recent Imaging Review  DG Foot Complete Left Please see detailed radiograph report in office note. DG Foot Complete Right Please see detailed  radiograph report in office note. Note: Reviewed        Physical Exam  General appearance: Well nourished, well developed, and well hydrated. In no apparent acute distress Mental status: Alert, oriented x 3 (person, place, & time)       Respiratory: Oxygen-dependent COPD Eyes: PERLA Vitals: BP 121/68   Pulse 83   Temp (!) 97.5 F (36.4 C)   Resp 18   Ht '5\' 11"'  (1.803 m)   Wt 237 lb (107.5 kg)   SpO2 90% Comment: o2'@2L'  [place]  BMI 33.05 kg/m  BMI: Estimated body mass index is 33.05 kg/m as calculated from the following:   Height as of this encounter: '5\' 11"'  (1.803 m).   Weight as of this encounter: 237 lb (107.5 kg). Ideal: Ideal body weight: 75.3 kg (166 lb 0.1 oz) Adjusted ideal body weight: 88.2 kg (194 lb 6.5 oz)  Lumbar Spine Area Exam  Skin & Axial Inspection: No masses, redness, or swelling Alignment: Symmetrical Functional ROM: Pain restricted ROM affecting both sides Stability: No instability detected Muscle Tone/Strength: Functionally intact. No obvious neuro-muscular anomalies detected. Sensory (Neurological): Neurogenic pain pattern and dermatomal   Gait & Posture Assessment  Ambulation: Patient came in today in a wheel chair Gait: Age-related, senile gait pattern Posture: Difficulty standing up straight, due to pain  Lower Extremity Exam      Side: Right lower extremity   Side: Left lower extremity  Stability: No instability observed           Stability: No instability observed          Skin & Extremity Inspection: Skin color, temperature, and hair growth are WNL. No peripheral edema or cyanosis. No masses, redness, swelling, asymmetry, or associated skin lesions.  No contractures.   Skin & Extremity Inspection: Skin color, temperature, and hair growth are WNL. No peripheral edema or cyanosis. No masses, redness, swelling, asymmetry, or associated skin lesions. No contractures.  Functional ROM: Pain restricted ROM for all joints of the lower extremity positive straight  leg raise test           Functional ROM: Pain restricted ROM for all joints of the lower extremity positive straight leg raise test          Muscle Tone/Strength: Functionally intact. No obvious neuro-muscular anomalies detected.   Muscle Tone/Strength: Functionally intact. No obvious neuro-muscular anomalies detected.  Sensory (Neurological): Unimpaired         Sensory (Neurological): Unimpaired        DTR: Patellar: deferred today Achilles: deferred today Plantar: deferred today   DTR: Patellar: deferred today Achilles: deferred today Plantar: deferred today  Palpation: No palpable anomalies   Palpation: No palpable anomalies     Assessment   Diagnosis Status  1. Spinal stenosis, lumbar region, with neurogenic claudication   2. Chronic radicular lumbar pain   3. Chronic pain syndrome   4. Current moderate episode of major depressive disorder without prior episode (HCC)    Persistent Persistent Persistent   Updated Problems: Problem  Spinal Stenosis, Lumbar Region, With Neurogenic Claudication  Sacroiliac Joint Pain  Piriformis Muscle Pain  Chronic Radicular Lumbar Pain  Cervical Facet Joint Syndrome  Lumbar Facet Arthropathy  Lumbar Radiculopathy  Chronic Pain Syndrome    Plan of Care   Recommend repeating lumbar epidural steroid injection and adding additional epidural volume to see if that has a positive impact on his pain intensity and frequency.  He states that the first 24 hours, he had absolutely no pain after his lumbar ESI.  We reviewed his fluoroscopy images from his previous epidural and we did have good coverage via contrast confirmation of the right L3, L4, L5 nerve roots.  I recommend repeating the epidural injection and adding additional volume for enhanced therapeutic effect potentially.  Future considerations include diagnostic lumbar facet medial branch nerve blocks for his low back pain related to lumbar facet arthropathy.  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  Of note, I attempted to contact [facility] for virtual visit on multiple occasions in the past but we had an incorrect phone number for him which has been updated.  Haywood has also endorsing lower mood, difficulty sleeping, anhedonia and decreased energy.  I would like for him to be evaluated by psychiatrist for depression management.   Orders:  Orders Placed This Encounter  Procedures   Lumbar Epidural Injection    Standing Status:   Future    Standing Expiration Date:   03/10/2022    Scheduling Instructions:     Procedure: Interlaminar Lumbar Epidural Steroid injection (LESI)            Laterality: RIGHT L4/5     Sedation: without- STOP Savaysa 3 days prior     Timeframe: ASAA    Order Specific Question:   Where will this procedure be performed?    Answer:   [facility]   Ambulatory referral to Psychiatry    Referral Priority:   Routine    Referral Type:   Psychiatric    Referral Reason:   Specialty Services Required    Referred to Provider:   Ursula Alert, MD    Requested Specialty:   Psychiatry    Number  of Visits Requested:   1   Follow-up plan:   Return in about 2 weeks (around 12/22/2021) for R L4/5 ESI #2 (stop blood thinner 3 days prior), in clinic NS.     R L4/5 ESI 10/26/21   Recent Visits Date Type Provider Dept  11/21/21 Office Visit Gillis Santa, MD [facility]  11/17/21 Office Visit Gillis Santa, MD [facility]  10/26/21 Procedure visit Gillis Santa, MD [facility]  10/06/21 Office Visit Gillis Santa, MD [facility]  Showing recent visits within past 90 days and meeting all other requirements Today's Visits Date Type Provider Dept  12/08/21 Office Visit Gillis Santa, MD [facility]  Showing today's visits and meeting all other requirements Future Appointments Date Type Provider Dept  12/29/21 Appointment Gillis Santa, MD  [facility]  Showing future appointments within next 90 days and meeting all other requirements  I discussed the assessment and treatment plan with the patient. The patient was provided an opportunity to ask questions and all were answered. The patient agreed with the plan and demonstrated an understanding of the instructions.  Patient advised to call back or seek an in-person evaluation if the symptoms or condition worsens. 30 minutes.  Total time on encounter, as per AMA guidelines included both the face-to-face and non-face-to-face time personally spent by the physician and/or other qualified health care professional(s) on the day of the encounter (includes time in activities that require the physician or other qualified health care professional and does not include time in activities normally performed by clinical staff). Physician's time may include the following activities when performed: preparing to see the patient (eg, review of tests, pre-charting review of records) obtaining and/or reviewing separately obtained history performing a medically appropriate examination and/or evaluation counseling and educating the patient/family/caregiver ordering medications, tests, or procedures referring and communicating with other health care professionals (when not separately reported) documenting clinical information in the electronic or other health record independently interpreting results (not separately reported) and communicating results to the patient/ family/caregiver care coordination (not separately reported)  Note by: Gillis Santa, MD Date: 12/08/2021; Time: 10:49 AM

## 2021-12-08 NOTE — Progress Notes (Signed)
Safety precautions to be maintained throughout the outpatient stay will include: orient to surroundings, keep bed in low position, maintain call bell within reach at all times, provide assistance with transfer out of bed and ambulation.  

## 2021-12-08 NOTE — Patient Instructions (Signed)
Stop Savaysa blood thinner for 3 days prior to procedure

## 2021-12-08 NOTE — Unmapped (Signed)
Paul Velez is asking if you would be willing to give it for emergency purposes? Or is there something else that can be prescribed for long term use... She said he is having trouble sleeping, which makes him depressed. When he actually gets a good nights sleep he is much better. Please Advise.

## 2021-12-08 NOTE — Unmapped (Signed)
Lorazepam is not a good long term option for sleep given dependence, respiratory depression and memory loss as side effects.

## 2021-12-08 NOTE — Unmapped (Signed)
Patient family states the lorazepam worked and patient was able to sleep.  Can he have a full prescription for this?      Was seen on 11/30/21 in acute care with Kennon Holter for this problem.

## 2021-12-09 NOTE — Unmapped (Addendum)
Spoke with pt who is inquiring about the status of Savaysa PA. No fax received yet with information for [facility]. Received covermymeds fax stating a prior authorizations for Eliquis, Xarelto, or Dabigatran Etexilate Mesylate is not required.

## 2021-12-12 MED ORDER — MIRTAZAPINE 15 MG TABLET
ORAL_TABLET | Freq: Every evening | ORAL | 2 refills | 30 days | Status: CP
Start: 2021-12-12 — End: 2022-12-12

## 2021-12-12 NOTE — Telephone Encounter (Signed)
Left message to call the office.

## 2021-12-12 NOTE — Unmapped (Signed)
Spoke with Paul Velez and relayed the message from Dr. Criselda Peaches. Patient would like to try adding Remeron.

## 2021-12-12 NOTE — Unmapped (Signed)
I am sorry he is still having difficulty sleeping! The Cymbalta should begin to take effect (can take 3-4 weeks to notice the benefit again after stopping it). We also discussed the option of Remeron for additional help with sleeping. I do not feel comfortable using benzos for sleep. If they would like to try Remeron in addition I can send a short course.

## 2021-12-12 NOTE — Unmapped (Signed)
Rx sent 

## 2021-12-12 NOTE — Unmapped (Signed)
He can ask his Cardiologist to try sending for it as it was approved under cardiology in the past. Other options are Apixaban or Riveroxaban. He can call insurance to see if they only cover one of the above options and start there.

## 2021-12-12 NOTE — Unmapped (Signed)
No answer. Left voice message to return call.

## 2021-12-12 NOTE — Unmapped (Signed)
Patient left a message stating that he received a letter from his insurance stating that the the PA for Savaysa has been denied.    Asked if there is an alternative?

## 2021-12-12 NOTE — Unmapped (Signed)
Spoke with patient and relayed message from Dr. Criselda Peaches. Patient will reach out to his Cardiologist to see if it can be prescribed by them.

## 2021-12-13 ENCOUNTER — Ambulatory Visit: Payer: 59 | Admitting: Student in an Organized Health Care Education/Training Program

## 2021-12-13 NOTE — Telephone Encounter (Signed)
Patient said to hold off on this referral since he needs to see Dr.Lateef first.

## 2021-12-14 ENCOUNTER — Ambulatory Visit: Payer: 59

## 2021-12-14 DIAGNOSIS — M5459 Other low back pain: Secondary | ICD-10-CM

## 2021-12-14 DIAGNOSIS — R262 Difficulty in walking, not elsewhere classified: Secondary | ICD-10-CM

## 2021-12-14 DIAGNOSIS — M6281 Muscle weakness (generalized): Secondary | ICD-10-CM

## 2021-12-14 DIAGNOSIS — R269 Unspecified abnormalities of gait and mobility: Secondary | ICD-10-CM

## 2021-12-14 NOTE — Therapy (Signed)
OUTPATIENT PHYSICAL THERAPY TREATMENT NOTE   Patient Name: Tallie Hevia MRN: [id number] DOB:1946/04/10, 76 y.o., male Today's Date: 12/14/2021   PT End of Session - 12/14/21 0756     Visit Number 6    Number of Visits 16    Date for PT Re-Evaluation 12/19/21    Authorization Type 6/10 eval 6/26    PT Start Time 0802    PT Stop Time 0844    PT Time Calculation (min) 42 min    Equipment Utilized During Treatment Gait belt    Activity Tolerance Patient tolerated treatment well    Behavior During Therapy WFL for tasks assessed/performed                 Past Medical History:  Diagnosis Date   Arthritis    hands, knees   Atrial fibrillation (HCC)    CHF (congestive heart failure) (HCC)    COPD (chronic obstructive pulmonary disease) (HCC)    Diabetes mellitus without complication (HCC)    Diabetic neuropathy (HCC)    Heart murmur    in past, mild 1969   Hypertension    resolved after weight loss and more activity   Neuromuscular disorder (HCC)    neuropathy   Neuropathy    Pulmonary embolism (HCC)    approx 2014   Shingles '99   Sleep apnea    CPAP   Wears dentures    full upper and lower   Past Surgical History:  Procedure Laterality Date   CATARACT EXTRACTION W/PHACO Left 05/12/2019   Procedure: CATARACT EXTRACTION PHACO AND INTRAOCULAR LENS PLACEMENT (IOC) LEFTDIABETIC ISTENT INJ 2.43  00:28.6;  Surgeon: Nevada Crane, MD;  Location: [facility];  Service: Ophthalmology;  Laterality: Left;  Diabetic - oral meds sleep apnea   CATARACT EXTRACTION W/PHACO Right 06/02/2019   Procedure: CATARACT EXTRACTION PHACO AND INTRAOCULAR LENS PLACEMENT (IOC) ISTENT INJ  RIGHT DIABETIC 1.31  00:25.4;  Surgeon: Nevada Crane, MD;  Location: [facility];  Service: Ophthalmology;  Laterality: Right;   COLONOSCOPY  08/30/2015   FINGER SURGERY Right    3'rd finger, right hand   KNEE SURGERY Right    screw in knee   WRIST SURGERY Left    metal removed    Patient Active Problem List   Diagnosis Date Noted   Spinal stenosis, lumbar region, with neurogenic claudication 10/06/2021   Sacroiliac joint pain 10/06/2021   Piriformis muscle pain 10/06/2021   Bilateral primary osteoarthritis of knee 06/11/2019   Chronic radicular lumbar pain 06/11/2019   Cervical facet joint syndrome 06/11/2019   Lumbar facet arthropathy 06/11/2019   Lumbar radiculopathy 06/11/2019   Chronic pain syndrome 06/11/2019   Lung nodule 02/09/2016   Exercise hypoxemia 12/28/2015   Chronic obstructive pulmonary disease (HCC) 03/16/2015   Nuclear sclerosis 03/16/2015   OSA (obstructive sleep apnea) 03/16/2015   Primary open angle glaucoma of both eyes, severe stage 03/16/2015   Type 2 diabetes mellitus without complication, without long-term current use of insulin (HCC) 03/16/2015   Arthralgia of left lower leg 10/29/2014   Osteoarthritis of left knee 09/10/2014   Type 2 diabetes mellitus with diabetic neuropathy (HCC) 08/04/2014   Cataract, nuclear sclerotic senile 04/01/2014   Combined forms of age-related cataract 04/01/2014   Keratitis sicca, bilateral (HCC) 04/01/2014   Primary open angle glaucoma 02/27/2014   Flat feet 01/29/2014   Foot pain, bilateral 01/29/2014   Tinea pedis of both feet 01/29/2014   Ptosis of both eyelids 11/25/2013   Type  2 diabetes mellitus without retinopathy ([place]) 11/25/2013   Onychomycosis [id number]   Chronic systolic heart failure ([place]) 11/11/2013   Emphysema of lung ([place]) 11/11/2013   Essential hypertension 11/11/2013   Screen for colon cancer 11/05/2013   Cardiomyopathy ([place]) 123XX123   Systolic HF (heart failure) ([place]) 123XX123   Acute systolic CHF (congestive heart failure) ([place]) 09/16/2013   COPD (chronic obstructive pulmonary disease) ([place]) 09/16/2013   HTN (hypertension) 09/16/2013   Obesity 09/16/2013   PAF (paroxysmal atrial fibrillation) ([place]) 09/16/2013   Senile nuclear sclerosis 05/22/2013   Muscle weakness  (generalized) 11/11/2009   Long term current use of anticoagulant therapy 06/17/2009   Pulmonary embolism ([place]) 06/17/2009   ED (erectile dysfunction) of organic origin 05/13/2003    PCP: Delma Freeze MD  REFERRING PROVIDER: Girtha Hake I MD   REFERRING DIAG: back and LE pain   Rationale for Evaluation and Treatment Rehabilitation  THERAPY DIAG:  Other low back pain  Muscle weakness (generalized)  Difficulty in walking, not elsewhere classified  Abnormality of gait and mobility  ONSET DATE: about 3 months ago.   SUBJECTIVE:                                                                                                                                                                                           SUBJECTIVE STATEMENT: Patient reports he slept some better last night but not feeling well- States can't walk well and low back pain around 7/10.   PERTINENT HISTORY:  Patient presents to physical therapy for low back and LE pain. PMH includes: arthritis, a fib, CHF, COPD, DM, neuropathy, heart murmur, HTN, PE, shingles, spinal stenosis, SI pain, bilateral OA of knees, lumbar facet arthropathy, lumbar radiculopathy, chronic pain syndrome, lung nodule, exercise hypoxemia, glaucoma of bilateral eyes, flat feet, onychomycosis, PAF. Per patient notes patient was recommended surgery by orthopedics but patient declined. Since patient was seen last in this clinic he has been in the ED three times. Patient reports his sciatic pain has been worsening in the past three months.   PAIN:  Are you having pain? Yes: NPRS scale: 7/10 Pain location: low back, buttock Pain description: radiating Aggravating factors: walking, moving Relieving factors: rest   PRECAUTIONS: Fall  WEIGHT BEARING RESTRICTIONS No  FALLS:  Has patient fallen in last 6 months? Yes. Number of falls 1  LIVING ENVIRONMENT: Lives with: lives with their family, lives with their spouse, and lives with  their partner Lives in: House/apartment Stairs: Yes: Internal: flight steps; can reach both Has following equipment at home: None  OCCUPATION: retired  PLOF: Independent with basic ADLs  PATIENT GOALS to  have less pain and be able to walk again    OBJECTIVE: all objective information taken at eval unless otherwise indicated  DIAGNOSTIC FINDINGS:  MRI 08/27/21: L3-4: Mild chronic degenerative changes without visible neural compression or progression.   L4-5: Disc bulge. Facet degeneration and hypertrophy. Stenosis of the lateral recesses and foramina could possibly cause neural compression. Foraminal stenosis slightly worse on the right. Slight worsening since 2019. Small synovial cyst previously seen is no longer visible.   L5-S1: Disc and facet degeneration. Bilateral foraminal stenosis could compress either or both L5 nerves. Similar appearance to the prior study.    LUMBAR ROM:   Active  A/PROM  eval  Flexion 41  Extension -4 from neutral*   Right lateral flexion 10 *  Left lateral flexion 10  Right rotation 6  Left rotation 8   (Blank rows = not tested) *pain    LOWER EXTREMITY MMT:    MMT Right eval Left eval  Hip flexion 3+ 3+  Hip extension 2 2  Hip abduction 3- 3-  Hip adduction 3- 3-  Hip internal rotation 2+ 2+  Hip external rotation 2+ 2+  Knee flexion 2+ 2+  Knee extension 3+ 3+  Ankle dorsiflexion 3 3  Ankle plantarflexion 3 3    TODAY'S TREATMENT   Manual:  Hamstring lengthening with leg on PT arm 30 seconds x 4 sets each LE Single knee to chest 30 seconds  x 4 setseach LE Figure four stretch 30 seconds x 4 sets each LE Sciatic nerve glide with leg on PT arm 20x each LE Lower trunk rotation x 30 sec x 4 sets each LE   TherEx:  Supine:     March with TrA tightening 10x each LE- VC and TC for correct technique  Posterior pelvic tilt 12x 5 seconds   GTB abduction 20x with TA contraction   Single leg abduction, exhale to return to  neutral for core activation 10x each LE: x 2 sets   TA Contraction with Adduction ball squeeze 15x 3 second holds   Sidelye TA contraction with clamshell using GTB x 15 reps each side   PATIENT EDUCATION:  Education details: Pt educated throughout session about proper posture and technique with exercises. Improved exercise technique, movement at target joints, use of target muscles after min to mod verbal, visual, tactile cues.  Person educated: Patient Education method: Explanation, Demonstration, Tactile cues, and Verbal cues Education comprehension: verbalized understanding, returned demonstration, verbal cues required, tactile cues required, and needs further education   HOME EXERCISE PROGRAM:   No updates 11/14/2021 Access Code: [email or web address] URL: [email or web address] Date: 10/24/2021 Prepared by: Precious Bard  Exercises - Supine Lower Trunk Rotation  - 1 x daily - 7 x weekly - 2 sets - 10 reps - 5 hold - Supine Posterior Pelvic Tilt  - 1 x daily - 7 x weekly - 2 sets - 10 reps - 5 hold - Supine Figure 4 Piriformis Stretch  - 1 x daily - 7 x weekly - 2 sets - 10 reps - 5 hold - Seated Hamstring Stretch  - 1 x daily - 7 x weekly - 2 sets - 2 reps - 30 hold  ASSESSMENT:  CLINICAL IMPRESSION: Patient presents with overall tightness Left side more than right side today. He responded well to manual therapy stating he felt better after session and "looser." He required initial VC to correctly activate his transverse abdominals but did improve with contraction throughout the session. The patient will benefit  from skilled physical therapy to decrease pain, improve mobility, and increase strength for improved quality of life.    OBJECTIVE IMPAIRMENTS Abnormal gait, cardiopulmonary status limiting activity, decreased activity tolerance, decreased balance, decreased coordination, decreased endurance, decreased mobility, difficulty walking, decreased ROM, decreased strength,  hypomobility, impaired perceived functional ability, increased muscle spasms, impaired flexibility, improper body mechanics, postural dysfunction, and pain.   ACTIVITY LIMITATIONS carrying, lifting, bending, sitting, standing, squatting, sleeping, stairs, transfers, bed mobility, bathing, toileting, dressing, reach over head, hygiene/grooming, and locomotion level  PARTICIPATION LIMITATIONS: meal prep, cleaning, laundry, driving, shopping, community activity, and yard work  PERSONAL FACTORS Age, Fitness, Past/current experiences, Time since onset of injury/illness/exacerbation, Transportation, and 3+ comorbidities: arthritis, a fib, CHF, COPD, DM, neuropathy, heart murmur, HTN, PE, shingles, spinal stenosis, SI pain, bilateral OA of knees, lumbar facet arthropathy, lumbar radiculopathy, chronic pain syndrome, lung nodule, exercise hypoxemia, glaucoma of bilateral eyes, flat feet, onychomycosis, PAF  are also affecting patient's functional outcome.   REHAB POTENTIAL: Fair    CLINICAL DECISION MAKING: Evolving/moderate complexity  EVALUATION COMPLEXITY: Moderate   GOALS: Goals reviewed with patient? Yes  SHORT TERM GOALS: Target date: 11/21/2021  Patient will be independent in home exercise program to improve strength/mobility for better functional independence with ADLs. Baseline: 6/26: Hep given Goal status: INITIAL  2.  Patient will report a worst pain of 5/10 on VAS in   back and LE's to improve tolerance with ADLs and reduced symptoms with activities.  Baseline: 6/26: 10/10 Goal status: INITIAL LONG TERM GOALS: Target date: 12/19/2021  Patient will increase FOTO score to equal to or greater than  53   to demonstrate statistically significant improvement in mobility and quality of life.  Baseline: 6/26: 41% Goal status: INITIAL  2.  Patient will report a worst pain of 3/10 on VAS in  low back and LEs  to improve tolerance with ADLs and reduced symptoms with activities.   Baseline:6/26: 10/10  Goal status: INITIAL  3.  Patient (> 34 years old) will complete five times sit to stand test in < 15 seconds indicating an increased LE strength and improved balance. Baseline: 6/26: 21.56 seconds with heavy BUE support Goal status: INITIAL  4.   Patient will reduce modified Oswestry score to <20 as to demonstrate minimal disability with ADLs including improved sleeping tolerance, walking/sitting tolerance etc for better mobility with ADLs Baseline: 6/26: 60%  Goal status: INITIAL  5.  Patient will increase 10 meter walk test to >1.47m/s as to improve gait speed for better community ambulation and to reduce fall risk. Baseline: 6/26: unable to ambulate distance Goal status: INITIAL    PLAN: PT FREQUENCY: 2x/week  PT DURATION: 8 weeks  PLANNED INTERVENTIONS: Therapeutic exercises, Therapeutic activity, Neuromuscular re-education, Balance training, Gait training, Patient/Family education, Joint manipulation, Joint mobilization, Stair training, DME instructions, Dry Needling, Spinal mobilization, Cryotherapy, Moist heat, Taping, Traction, Ultrasound, Ionotophoresis 4mg /ml Dexamethasone, and Manual therapy.  PLAN FOR NEXT SESSION: core stabilization, mobilization, LE lengthening.    Ollen Bowl, PT  12/14/2021, 9:27 PM

## 2021-12-15 ENCOUNTER — Ambulatory Visit
Admit: 2021-12-15 | Discharge: 2021-12-16 | Payer: MEDICARE | Attending: Student in an Organized Health Care Education/Training Program | Primary: Student in an Organized Health Care Education/Training Program

## 2021-12-15 DIAGNOSIS — L400 Psoriasis vulgaris: Principal | ICD-10-CM

## 2021-12-15 MED ORDER — HYDROCORTISONE 2.5 % TOPICAL CREAM
Freq: Two times a day (BID) | TOPICAL | 1 refills | 0 days | Status: CP
Start: 2021-12-15 — End: 2022-12-15

## 2021-12-15 MED ORDER — TRIAMCINOLONE ACETONIDE 0.1 % TOPICAL CREAM
Freq: Two times a day (BID) | TOPICAL | 2 refills | 0 days | Status: CP
Start: 2021-12-15 — End: 2022-12-15

## 2021-12-15 NOTE — Unmapped (Addendum)
For your left foot:  - Keep the affected area covered with a bandaid and moist with Vaseline.     For your legs:   - Apply triamcinolone (KENALOG) 0.1 % cream; Apply topically Two (2) times a day.     For behind your ears:   - Apply hydrocortisone 2.5 % cream; Apply topically Two (2) times a day.

## 2021-12-15 NOTE — Unmapped (Unsigned)
Dermatology Note       Assessment and Plan:      Plaque psoriasis  - Less than 5% BSA involved  - Previously failed high potency topical steroids  - Treatment options were discussed with the patient. Patient has no history of IBD.   - Continue secukinumab (COSENTYX PEN, 2 PENS,) 150 mg/mL PnIj injection; Inject 2 mL (300 mg total) under the skin every twenty-eight (28) days.  - Start triamcinolone (KENALOG) 0.1 % cream; Apply topically Two (2) times a day. For your legs.  - Start hydrocortisone 2.5 % cream; Apply topically Two (2) times a day. Apply behind your ears.    High risk medication use, cosentyx initiation  - Hep C ab negative 06/24/21  - Labs drawn on 09/14/21 including: CBC w/ diff abnormal (AST, ALT, BUN, Creatinine, Hep B panel, Quant Gold all WNL)  - Labs pended 09/22/2021 including: CBC, CMP, Lipid Panel, Hemoglobin A1c, PSA    The patient was advised to call for an appointment should any new, changing, or symptomatic lesions develop.     RTC: Return if symptoms worsen or fail to improve. or sooner as needed   _________________________________________________________________      Chief Complaint     Chief Complaint   Patient presents with   ??? Skin Check     FBSE  Areas of concernears       HPI     Paul Velez is a 76 y.o. male who presents as a returning patient (last seen by Dr. Eustace Quail on 09/14/2021) to Dermatology for follow up of psoriasis. At last visit, patient was to continue clobetasol ointment and start cosentyx for her plaque psoriasis.     Plaque psoriasis  ??? Patient reports a lesion on both ears and left foot that was previously a bubble     The patient denies any other new or changing lesions or areas of concern.     Pertinent Past Medical History     No history of skin cancer  Psoriasis  T2DM  Diabetic neuropathy  CHF  COPD  HTN    Family History:   Negative for melanoma    Past Medical History, Family History, Social History, Medication List, Allergies, and Problem List were reviewed in the rooming section of Epic.     ROS: Other than symptoms mentioned in the HPI, no fevers, chills, or other skin complaints    Physical Examination     GENERAL: Well-appearing male in no acute distress, resting comfortably.  NEURO: Alert and oriented, answers questions appropriately  PSYCH: Normal mood and affect  SKIN (Focal Skin Exam): Per patient request, examination of bilateral lower extremities and ears was performed  - bilateral lower extremities with well demarcated hyperpigmented plaques resolving   - left dorsal foot with round erosion   - postauricular folds with erythema and greasy scale     All areas not commented on are within normal limits or unremarkable    Scribe's Attestation: Cruzita Lederer, MD obtained and performed the history, physical exam and medical decision making elements that were entered into the chart.  Signed by Sander Nephew, Scribe, on December 15, 2021 at 3:31 PM.    ----------------------------------------------------------------------------------------------------------------------  December 15, 2021 4:10 PM. Documentation assistance provided by the Scribe. I was present during the time the encounter was recorded. The information recorded by the Scribe was done at my direction and has been reviewed and validated by me.  ----------------------------------------------------------------------------------------------------------------------     (Approved Template 01/12/2020)

## 2021-12-19 ENCOUNTER — Ambulatory Visit: Payer: 59

## 2021-12-19 DIAGNOSIS — R262 Difficulty in walking, not elsewhere classified: Secondary | ICD-10-CM

## 2021-12-19 DIAGNOSIS — M5459 Other low back pain: Secondary | ICD-10-CM | POA: Diagnosis not present

## 2021-12-19 DIAGNOSIS — M6281 Muscle weakness (generalized): Secondary | ICD-10-CM

## 2021-12-19 DIAGNOSIS — R269 Unspecified abnormalities of gait and mobility: Secondary | ICD-10-CM

## 2021-12-19 MED ORDER — OZEMPIC 1 MG/DOSE (4 MG/3 ML) SUBCUTANEOUS PEN INJECTOR
SUBCUTANEOUS | 1 refills | 84 days | Status: CP
Start: 2021-12-19 — End: 2022-06-17

## 2021-12-19 NOTE — Therapy (Signed)
OUTPATIENT PHYSICAL THERAPY TREATMENT NOTE/RECERTIFICATION FOR DATES 12/19/2021- 03/13/2022   Patient Name: Mario [name] MRN: [id number] DOB:1945-10-07, 76 y.o., male Today's Date: 12/19/2021   PT End of Session - 12/19/21 0847     Visit Number 7    Number of Visits 16    Date for PT Re-Evaluation 03/13/22    Authorization Type 6/10 eval 6/26    PT Start Time 0843    PT Stop Time 0929    PT Time Calculation (min) 46 min    Equipment Utilized During Treatment Gait belt    Activity Tolerance Patient tolerated treatment well    Behavior During Therapy WFL for tasks assessed/performed                  Past Medical History:  Diagnosis Date   Arthritis    hands, knees   Atrial fibrillation (HCC)    CHF (congestive heart failure) (HCC)    COPD (chronic obstructive pulmonary disease) ([place])    Diabetes mellitus without complication ([place])    Diabetic neuropathy ([place])    Heart murmur    in past, mild 1969   Hypertension    resolved after weight loss and more activity   Neuromuscular disorder ([place])    neuropathy   Neuropathy    Pulmonary embolism ([place])    approx 2014   Shingles '99   Sleep apnea    CPAP   Wears dentures    full upper and lower   Past Surgical History:  Procedure Laterality Date   CATARACT EXTRACTION W/PHACO Left 05/12/2019   Procedure: CATARACT EXTRACTION PHACO AND INTRAOCULAR LENS PLACEMENT ([place]) LEFTDIABETIC ISTENT INJ 2.43  00:28.6;  Surgeon: Eulogio Bear, MD;  Location: [facility];  Service: Ophthalmology;  Laterality: Left;  Diabetic - oral meds sleep apnea   CATARACT EXTRACTION W/PHACO Right 06/02/2019   Procedure: CATARACT EXTRACTION PHACO AND INTRAOCULAR LENS PLACEMENT ([facility]) ISTENT INJ  RIGHT DIABETIC 1.31  00:25.4;  Surgeon: Eulogio Bear, MD;  Location: [facility];  Service: Ophthalmology;  Laterality: Right;   COLONOSCOPY  08/30/2015   FINGER SURGERY Right    3'rd finger, right hand   KNEE SURGERY Right     screw in knee   WRIST SURGERY Left    metal removed   Patient Active Problem List   Diagnosis Date Noted   Spinal stenosis, lumbar region, with neurogenic claudication 10/06/2021   Sacroiliac joint pain 10/06/2021   Piriformis muscle pain 10/06/2021   Bilateral primary osteoarthritis of knee 06/11/2019   Chronic radicular lumbar pain 06/11/2019   Cervical facet joint syndrome 06/11/2019   Lumbar facet arthropathy 06/11/2019   Lumbar radiculopathy 06/11/2019   Chronic pain syndrome 06/11/2019   Lung nodule 02/09/2016   Exercise hypoxemia 12/28/2015   Chronic obstructive pulmonary disease ([place]) 03/16/2015   Nuclear sclerosis 03/16/2015   OSA (obstructive sleep apnea) 03/16/2015   Primary open angle glaucoma of both eyes, severe stage 03/16/2015   Type 2 diabetes mellitus without complication, without long-term current use of insulin ([place]) 03/16/2015   Arthralgia of left lower leg 10/29/2014   Osteoarthritis of left knee 09/10/2014   Type 2 diabetes mellitus with diabetic neuropathy ([place]) 08/04/2014   Cataract, nuclear sclerotic senile 04/01/2014   Combined forms of age-related cataract 04/01/2014   Keratitis sicca, bilateral ([place]) 04/01/2014   Primary open angle glaucoma 02/27/2014   Flat feet 01/29/2014   Foot pain, bilateral 01/29/2014   Tinea pedis of both feet 01/29/2014   Ptosis of both  eyelids 11/25/2013   Type 2 diabetes mellitus without retinopathy (HCC) 11/25/2013   Onychomycosis 11/19/2013   Chronic systolic heart failure (HCC) 11/11/2013   Emphysema of lung (HCC) 11/11/2013   Essential hypertension 11/11/2013   Screen for colon cancer 11/05/2013   Cardiomyopathy (HCC) 10/08/2013   Systolic HF (heart failure) (HCC) 10/08/2013   Acute systolic CHF (congestive heart failure) (HCC) 09/16/2013   COPD (chronic obstructive pulmonary disease) (HCC) 09/16/2013   HTN (hypertension) 09/16/2013   Obesity 09/16/2013   PAF (paroxysmal atrial fibrillation) (HCC) 09/16/2013    Senile nuclear sclerosis 05/22/2013   Muscle weakness (generalized) 11/11/2009   Long term current use of anticoagulant therapy 06/17/2009   Pulmonary embolism (HCC) 06/17/2009   ED (erectile dysfunction) of organic origin 05/13/2003    PCP: Maree Erie MD  REFERRING PROVIDER: Filomena Jungling I MD   REFERRING DIAG: back and LE pain   Rationale for Evaluation and Treatment Rehabilitation  THERAPY DIAG:  Other low back pain  Muscle weakness (generalized)  Difficulty in walking, not elsewhere classified  Abnormality of gait and mobility  ONSET DATE: about 3 months ago.   SUBJECTIVE:                                                                                                                                                                                           SUBJECTIVE STATEMENT: Patient reports low back pain not as bad but having some increased left buttock pain- states intermittent but up to 9/10 with difficulty walking.   PERTINENT HISTORY:  Patient presents to physical therapy for low back and LE pain. PMH includes: arthritis, a fib, CHF, COPD, DM, neuropathy, heart murmur, HTN, PE, shingles, spinal stenosis, SI pain, bilateral OA of knees, lumbar facet arthropathy, lumbar radiculopathy, chronic pain syndrome, lung nodule, exercise hypoxemia, glaucoma of bilateral eyes, flat feet, onychomycosis, PAF. Per patient notes patient was recommended surgery by orthopedics but patient declined. Since patient was seen last in this clinic he has been in the ED three times. Patient reports his sciatic pain has been worsening in the past three months.   PAIN:  Are you having pain? Yes: NPRS scale: 9/10 Pain location: left buttock Pain description: sharp at times Aggravating factors: walking, moving Relieving factors: rest   PRECAUTIONS: Fall  WEIGHT BEARING RESTRICTIONS No  FALLS:  Has patient fallen in last 6 months? Yes. Number of falls 1  LIVING  ENVIRONMENT: Lives with: lives with their family, lives with their spouse, and lives with their partner Lives in: House/apartment Stairs: Yes: Internal: flight steps; can reach both Has following equipment at home: None  OCCUPATION: retired  PLOF:  Independent with basic ADLs  PATIENT GOALS to have less pain and be able to walk again    OBJECTIVE: all objective information taken at eval unless otherwise indicated  DIAGNOSTIC FINDINGS:  MRI 08/27/21: L3-4: Mild chronic degenerative changes without visible neural compression or progression.   L4-5: Disc bulge. Facet degeneration and hypertrophy. Stenosis of the lateral recesses and foramina could possibly cause neural compression. Foraminal stenosis slightly worse on the right. Slight worsening since 2019. Small synovial cyst previously seen is no longer visible.   L5-S1: Disc and facet degeneration. Bilateral foraminal stenosis could compress either or both L5 nerves. Similar appearance to the prior study.    LUMBAR ROM:   Active  A/PROM  eval  Flexion 41  Extension -4 from neutral*   Right lateral flexion 10 *  Left lateral flexion 10  Right rotation 6  Left rotation 8   (Blank rows = not tested) *pain    LOWER EXTREMITY MMT:    MMT Right eval Left eval  Hip flexion 3+ 3+  Hip extension 2 2  Hip abduction 3- 3-  Hip adduction 3- 3-  Hip internal rotation 2+ 2+  Hip external rotation 2+ 2+  Knee flexion 2+ 2+  Knee extension 3+ 3+  Ankle dorsiflexion 3 3  Ankle plantarflexion 3 3    TODAY'S TREATMENT   Manual:  Hamstring lengthening with leg on PT arm 30 seconds x 4 sets each LE Single knee to chest 30 seconds  x 4 setseach LE Figure four stretch 30 seconds x 4 sets each LE Sciatic nerve glide with leg on PT arm 20x each LE Lower trunk rotation x 30 sec x 4 sets each LE   Reassessed goals for recert visit: See goal section for today's values/progress update.   TherEx:  Seated:     Instructed  in seated LE stretching:  Hamstring - hold 30 sec x 3 bilateral Figure 4 - hold 30 sec x 3 on left  PATIENT EDUCATION:  Education details: Pt educated throughout session about proper posture and technique with exercises. Improved exercise technique, movement at target joints, use of target muscles after min to mod verbal, visual, tactile cues.  Person educated: Patient Education method: Explanation, Demonstration, Tactile cues, and Verbal cues Education comprehension: verbalized understanding, returned demonstration, verbal cues required, tactile cues required, and needs further education   HOME EXERCISE PROGRAM:  Access Code: [email or web address] URL: [email or web address] Date: 12/19/2021 Prepared by: Maureen Ralphs  Exercises - Seated Figure 4 Piriformis Stretch  - 1 x daily - 3 sets - 20-30 hold - Seated Hamstring Stretch  - 1 x daily - 3 sets - 20-30 hold    Access Code: [email or web address] URL: [email or web address] Date: 10/24/2021 Prepared by: Precious Bard  Exercises - Supine Lower Trunk Rotation  - 1 x daily - 7 x weekly - 2 sets - 10 reps - 5 hold - Supine Posterior Pelvic Tilt  - 1 x daily - 7 x weekly - 2 sets - 10 reps - 5 hold - Supine Figure 4 Piriformis Stretch  - 1 x daily - 7 x weekly - 2 sets - 10 reps - 5 hold - Seated Hamstring Stretch  - 1 x daily - 7 x weekly - 2 sets - 2 reps - 30 hold  ASSESSMENT:  CLINICAL IMPRESSION: Patient presents for reassessed for recert visit. He continues to be very limited with mobility- not so much low back as previous but pain in left buttock. He is familiar with pain  management strategies including meds/head/changing position and stretching. He reports compliance with current HEP and will benefit from progression for core strengthening. He did demo slight improvement in [place] and Mod Oswestry indicating improved self perceived condition. He also demo significant improvement in LE strength as seen by 5 sec sit to  stand test. Patient's condition has the potential to improve in response to therapy. Maximum improvement is yet to be obtained. The anticipated improvement is attainable and reasonable in a generally predictable time. The patient will benefit from skilled physical therapy to decrease pain, improve mobility, and increase strength for improved quality of life.    OBJECTIVE IMPAIRMENTS Abnormal gait, cardiopulmonary status limiting activity, decreased activity tolerance, decreased balance, decreased coordination, decreased endurance, decreased mobility, difficulty walking, decreased ROM, decreased strength, hypomobility, impaired perceived functional ability, increased muscle spasms, impaired flexibility, improper body mechanics, postural dysfunction, and pain.   ACTIVITY LIMITATIONS carrying, lifting, bending, sitting, standing, squatting, sleeping, stairs, transfers, bed mobility, bathing, toileting, dressing, reach over head, hygiene/grooming, and locomotion level  PARTICIPATION LIMITATIONS: meal prep, cleaning, laundry, driving, shopping, community activity, and yard work  PERSONAL FACTORS Age, Fitness, Past/current experiences, Time since onset of injury/illness/exacerbation, Transportation, and 3+ comorbidities: arthritis, a fib, CHF, COPD, DM, neuropathy, heart murmur, HTN, PE, shingles, spinal stenosis, SI pain, bilateral OA of knees, lumbar facet arthropathy, lumbar radiculopathy, chronic pain syndrome, lung nodule, exercise hypoxemia, glaucoma of bilateral eyes, flat feet, onychomycosis, PAF  are also affecting patient's functional outcome.   REHAB POTENTIAL: Fair    CLINICAL DECISION MAKING: Evolving/moderate complexity  EVALUATION COMPLEXITY: Moderate   GOALS: Goals reviewed with patient? Yes  SHORT TERM GOALS: Target date: 01/30/2022  Patient will be independent in home exercise program to improve strength/mobility for better functional independence with ADLs. Baseline: 6/26: Hep  given; 12/16/2021- Patient reports compliance with HEP to date- stating focusing on stretching for back pain. Goal status: Ongoing  2.  Patient will report a worst pain of 5/10 on VAS in   back and LE's to improve tolerance with ADLs and reduced symptoms with activities.  Baseline: 6/26: 10/10; Buttock pain 9/10; 0/10 low back pain Goal status: Ongoing   LONG TERM GOALS: Target date: 03/13/2022  Patient will increase FOTO score to equal to or greater than  53   to demonstrate statistically significant improvement in mobility and quality of life.  Baseline: 6/26: 41%; 12/19/2021= 46% Goal status: Ongoing  2.  Patient will report a worst pain of 3/10 on VAS in  low back and LEs  to improve tolerance with ADLs and reduced symptoms with activities.  Baseline:6/26: 10/10; 12/19/2021=Buttock pain 9/10; 0/10 low back pain Goal status: Ongoing  3.  Patient (> 57 years old) will complete five times sit to stand test in < 15 seconds indicating an increased LE strength and improved balance. Baseline: 6/26: 21.56 seconds with heavy BUE support; 12/19/2021= 15.20 sec with min UE support on arm rest. Goal status: Ongoing  4.   Patient will reduce modified Oswestry score to <20 as to demonstrate minimal disability with ADLs including improved sleeping tolerance, walking/sitting tolerance etc for better mobility with ADLs Baseline: 6/26: 60%; 10/18/5091= 56% Goal status: Ongoing  5.  Patient will increase 10 meter walk test to >1.28m/s as to improve gait speed for better community ambulation and to reduce fall risk. Baseline: 6/26: unable to ambulate distance; 12/19/2021= 0.4 m/s (unsteady and reported increased left buttock pain- almost fell- requiring min A to maintain balance upon completion)  Goal status: Ongoing  PLAN: PT FREQUENCY: 2x/week  PT DURATION: 12 weeks  PLANNED INTERVENTIONS: Therapeutic exercises, Therapeutic activity, Neuromuscular re-education, Balance training, Gait training,  Patient/Family education, Joint manipulation, Joint mobilization, Stair training, DME instructions, Dry Needling, Spinal mobilization, Cryotherapy, Moist heat, Taping, Traction, Ultrasound, Ionotophoresis 4mg /ml Dexamethasone, and Manual therapy.  PLAN FOR NEXT SESSION: core stabilization, mobilization, LE lengthening.    Ollen Bowl, PT  12/19/2021, 1:51 PM

## 2021-12-19 NOTE — Unmapped (Signed)
Originally prescribed by previous Provider    Patient is requesting the following refill  Requested Prescriptions     Pending Prescriptions Disp Refills    semaglutide (OZEMPIC) 1 mg/dose (4 mg/3 mL) PnIj injection  0     Sig: Inject under the skin every seven (7) days.       Recent Visits  Date Type Provider Dept   12/01/21 Office Visit Claudean Kinds, MD [facility]   11/30/21 Office Visit Gilford Silvius, NP [facility]   10/24/21 Office Visit Claudean Kinds, MD [facility]   09/22/21 Office Visit Claudean Kinds, MD [facility]   06/24/21 Office Visit Claudean Kinds, MD [facility] [place]   Showing recent visits within past 365 days with a meds authorizing provider and meeting all other requirements  Future Appointments  Date Type Provider Dept   12/26/21 Appointment Claudean Kinds, MD [facility]   08/18/22 Appointment Claudean Kinds, MD [facility] [place]   Showing future appointments within next 365 days with a meds authorizing provider and meeting all other requirements       Labs: A1c:   Hemoglobin A1C (%)   Date Value   09/14/2021 6.5 (A)    Creatinine:   Creatinine (mg/dL)   Date Value   16/01/9603 1.53 (H)

## 2021-12-20 NOTE — Therapy (Signed)
OUTPATIENT PHYSICAL THERAPY TREATMENT NOTE   Patient Name: Mario [name] MRN: [id number] DOB:09/21/1945, 76 y.o., male Today's Date: 12/21/2021   PT End of Session - 12/21/21 0802     Visit Number 8    Number of Visits 16    Date for PT Re-Evaluation 03/13/22    Authorization Type 8/10 eval 6/26    PT Start Time 0800    PT Stop Time 0844    PT Time Calculation (min) 44 min    Equipment Utilized During Treatment Gait belt    Activity Tolerance Patient tolerated treatment well    Behavior During Therapy WFL for tasks assessed/performed                  Past Medical History:  Diagnosis Date   Arthritis    hands, knees   Atrial fibrillation (HCC)    CHF (congestive heart failure) (HCC)    COPD (chronic obstructive pulmonary disease) (HCC)    Diabetes mellitus without complication (HCC)    Diabetic neuropathy (HCC)    Heart murmur    in past, mild 1969   Hypertension    resolved after weight loss and more activity   Neuromuscular disorder (HCC)    neuropathy   Neuropathy    Pulmonary embolism (HCC)    approx 2014   Shingles '99   Sleep apnea    CPAP   Wears dentures    full upper and lower   Past Surgical History:  Procedure Laterality Date   CATARACT EXTRACTION W/PHACO Left 05/12/2019   Procedure: CATARACT EXTRACTION PHACO AND INTRAOCULAR LENS PLACEMENT (IOC) LEFTDIABETIC ISTENT INJ 2.43  00:28.6;  Surgeon: Nevada Crane, MD;  Location: [facility];  Service: Ophthalmology;  Laterality: Left;  Diabetic - oral meds sleep apnea   CATARACT EXTRACTION W/PHACO Right 06/02/2019   Procedure: CATARACT EXTRACTION PHACO AND INTRAOCULAR LENS PLACEMENT (IOC) ISTENT INJ  RIGHT DIABETIC 1.31  00:25.4;  Surgeon: Nevada Crane, MD;  Location: [facility];  Service: Ophthalmology;  Laterality: Right;   COLONOSCOPY  08/30/2015   FINGER SURGERY Right    3'rd finger, right hand   KNEE SURGERY Right    screw in knee   WRIST SURGERY Left    metal  removed   Patient Active Problem List   Diagnosis Date Noted   Spinal stenosis, lumbar region, with neurogenic claudication 10/06/2021   Sacroiliac joint pain 10/06/2021   Piriformis muscle pain 10/06/2021   Bilateral primary osteoarthritis of knee 06/11/2019   Chronic radicular lumbar pain 06/11/2019   Cervical facet joint syndrome 06/11/2019   Lumbar facet arthropathy 06/11/2019   Lumbar radiculopathy 06/11/2019   Chronic pain syndrome 06/11/2019   Lung nodule 02/09/2016   Exercise hypoxemia 12/28/2015   Chronic obstructive pulmonary disease (HCC) 03/16/2015   Nuclear sclerosis 03/16/2015   OSA (obstructive sleep apnea) 03/16/2015   Primary open angle glaucoma of both eyes, severe stage 03/16/2015   Type 2 diabetes mellitus without complication, without long-term current use of insulin (HCC) 03/16/2015   Arthralgia of left lower leg 10/29/2014   Osteoarthritis of left knee 09/10/2014   Type 2 diabetes mellitus with diabetic neuropathy (HCC) 08/04/2014   Cataract, nuclear sclerotic senile 04/01/2014   Combined forms of age-related cataract 04/01/2014   Keratitis sicca, bilateral (HCC) 04/01/2014   Primary open angle glaucoma 02/27/2014   Flat feet 01/29/2014   Foot pain, bilateral 01/29/2014   Tinea pedis of both feet 01/29/2014   Ptosis of both eyelids 11/25/2013  Type 2 diabetes mellitus without retinopathy (HCC) 11/25/2013   Onychomycosis 11/19/2013   Chronic systolic heart failure (HCC) 11/11/2013   Emphysema of lung (HCC) 11/11/2013   Essential hypertension 11/11/2013   Screen for colon cancer 11/05/2013   Cardiomyopathy (HCC) 10/08/2013   Systolic HF (heart failure) (HCC) 10/08/2013   Acute systolic CHF (congestive heart failure) (HCC) 09/16/2013   COPD (chronic obstructive pulmonary disease) (HCC) 09/16/2013   HTN (hypertension) 09/16/2013   Obesity 09/16/2013   PAF (paroxysmal atrial fibrillation) (HCC) 09/16/2013   Senile nuclear sclerosis 05/22/2013   Muscle  weakness (generalized) 11/11/2009   Long term current use of anticoagulant therapy 06/17/2009   Pulmonary embolism (HCC) 06/17/2009   ED (erectile dysfunction) of organic origin 05/13/2003    PCP: Maree Erie MD  REFERRING PROVIDER: Filomena Jungling I MD   REFERRING DIAG: back and LE pain   Rationale for Evaluation and Treatment Rehabilitation  THERAPY DIAG:  Other low back pain  Muscle weakness (generalized)  Difficulty in walking, not elsewhere classified  ONSET DATE: about 3 months ago.   SUBJECTIVE:                                                                                                                                                                                           SUBJECTIVE STATEMENT: Patient reports he gets his epidural today at 11. Has been having pain nonstop.   PERTINENT HISTORY:  Patient presents to physical therapy for low back and LE pain. PMH includes: arthritis, a fib, CHF, COPD, DM, neuropathy, heart murmur, HTN, PE, shingles, spinal stenosis, SI pain, bilateral OA of knees, lumbar facet arthropathy, lumbar radiculopathy, chronic pain syndrome, lung nodule, exercise hypoxemia, glaucoma of bilateral eyes, flat feet, onychomycosis, PAF. Per patient notes patient was recommended surgery by orthopedics but patient declined. Since patient was seen last in this clinic he has been in the ED three times. Patient reports his sciatic pain has been worsening in the past three months.   PAIN:  Are you having pain? Yes: NPRS scale: 6-7/10 Pain location: low back, buttock Pain description: radiating Aggravating factors: walking, moving Relieving factors: rest   PRECAUTIONS: Fall  WEIGHT BEARING RESTRICTIONS No  FALLS:  Has patient fallen in last 6 months? Yes. Number of falls 1  LIVING ENVIRONMENT: Lives with: lives with their family, lives with their spouse, and lives with their partner Lives in: House/apartment Stairs: Yes: Internal: flight  steps; can reach both Has following equipment at home: None  OCCUPATION: retired  PLOF: Independent with basic ADLs  PATIENT GOALS to have less pain and be able to walk again    OBJECTIVE:  all objective information taken at eval unless otherwise indicated  DIAGNOSTIC FINDINGS:  MRI 08/27/21: L3-4: Mild chronic degenerative changes without visible neural compression or progression.   L4-5: Disc bulge. Facet degeneration and hypertrophy. Stenosis of the lateral recesses and foramina could possibly cause neural compression. Foraminal stenosis slightly worse on the right. Slight worsening since 2019. Small synovial cyst previously seen is no longer visible.   L5-S1: Disc and facet degeneration. Bilateral foraminal stenosis could compress either or both L5 nerves. Similar appearance to the prior study.    LUMBAR ROM:   Active  A/PROM  eval  Flexion 41  Extension -4 from neutral*   Right lateral flexion 10 *  Left lateral flexion 10  Right rotation 6  Left rotation 8   (Blank rows = not tested) *pain    LOWER EXTREMITY MMT:    MMT Right eval Left eval  Hip flexion 3+ 3+  Hip extension 2 2  Hip abduction 3- 3-  Hip adduction 3- 3-  Hip internal rotation 2+ 2+  Hip external rotation 2+ 2+  Knee flexion 2+ 2+  Knee extension 3+ 3+  Ankle dorsiflexion 3 3  Ankle plantarflexion 3 3    TODAY'S TREATMENT   Manual:  Hamstring lengthening with leg on PT arm 45 seconds each le Single knee to chest 30 seconds each LE Figure four stretch 30 seconds each LE Sciatic nerve glide with leg on PT arm 20x each LE Roller to bilateral IT band and low back  x 6 minutes   TherEx:  Supine:      SAQ over bolster 10x 3 second holds each LE; x 2 sets with # 2 ankle weights    Posterior pelvic tilt 12x 5 seconds    Posterior pelvic tilt with adduction ball squeeze 12x   Single leg abduction, exhale to return to neutral for core activation 10x each LE: x 2 sets    March with  TrA tightening 10x each LE with #2 ankle weights; x 2 sets  on each LE    Adduction ball squeeze 15x 3 second holds    GTB abduction 20x  Sidelying: -clamshells 15x each LE  Seated posterior pelvic tilt with TrA activation 10x 3 second holds   PATIENT EDUCATION:  Education details: Pt educated throughout session about proper posture and technique with exercises. Improved exercise technique, movement at target joints, use of target muscles after min to mod verbal, visual, tactile cues.  Person educated: Patient Education method: Explanation, Demonstration, Tactile cues, and Verbal cues Education comprehension: verbalized understanding, returned demonstration, verbal cues required, tactile cues required, and needs further education   HOME EXERCISE PROGRAM:   No updates 11/14/2021 Access Code: [email or web address] URL: [email or web address] Date: 10/24/2021 Prepared by: Janna Arch  Exercises - Supine Lower Trunk Rotation  - 1 x daily - 7 x weekly - 2 sets - 10 reps - 5 hold - Supine Posterior Pelvic Tilt  - 1 x daily - 7 x weekly - 2 sets - 10 reps - 5 hold - Supine Figure 4 Piriformis Stretch  - 1 x daily - 7 x weekly - 2 sets - 10 reps - 5 hold - Seated Hamstring Stretch  - 1 x daily - 7 x weekly - 2 sets - 2 reps - 30 hold  ASSESSMENT:  CLINICAL IMPRESSION: Patient presents with excellent motivation, he is eager to receive injection this afternoon for pain reduction. Patient tolerates use of roller to low back and It band well with decreased  pain reported. Core strengthening techniques continue to be relevant to care.  The patient will benefit from skilled physical therapy to decrease pain, improve mobility, and increase strength for improved quality of life.    OBJECTIVE IMPAIRMENTS Abnormal gait, cardiopulmonary status limiting activity, decreased activity tolerance, decreased balance, decreased coordination, decreased endurance, decreased mobility, difficulty walking,  decreased ROM, decreased strength, hypomobility, impaired perceived functional ability, increased muscle spasms, impaired flexibility, improper body mechanics, postural dysfunction, and pain.   ACTIVITY LIMITATIONS carrying, lifting, bending, sitting, standing, squatting, sleeping, stairs, transfers, bed mobility, bathing, toileting, dressing, reach over head, hygiene/grooming, and locomotion level  PARTICIPATION LIMITATIONS: meal prep, cleaning, laundry, driving, shopping, community activity, and yard work  PERSONAL FACTORS Age, Fitness, Past/current experiences, Time since onset of injury/illness/exacerbation, Transportation, and 3+ comorbidities: arthritis, a fib, CHF, COPD, DM, neuropathy, heart murmur, HTN, PE, shingles, spinal stenosis, SI pain, bilateral OA of knees, lumbar facet arthropathy, lumbar radiculopathy, chronic pain syndrome, lung nodule, exercise hypoxemia, glaucoma of bilateral eyes, flat feet, onychomycosis, PAF  are also affecting patient's functional outcome.   REHAB POTENTIAL: Fair    CLINICAL DECISION MAKING: Evolving/moderate complexity  EVALUATION COMPLEXITY: Moderate   GOALS: Goals reviewed with patient? Yes  SHORT TERM GOALS: Target date: 11/21/2021  Patient will be independent in home exercise program to improve strength/mobility for better functional independence with ADLs. Baseline: 6/26: Hep given Goal status: INITIAL  2.  Patient will report a worst pain of 5/10 on VAS in   back and LE's to improve tolerance with ADLs and reduced symptoms with activities.  Baseline: 6/26: 10/10 Goal status: INITIAL LONG TERM GOALS: Target date: 12/19/2021  Patient will increase FOTO score to equal to or greater than  53   to demonstrate statistically significant improvement in mobility and quality of life.  Baseline: 6/26: 41% Goal status: INITIAL  2.  Patient will report a worst pain of 3/10 on VAS in  low back and LEs  to improve tolerance with ADLs and reduced  symptoms with activities.  Baseline:6/26: 10/10  Goal status: INITIAL  3.  Patient (> 81 years old) will complete five times sit to stand test in < 15 seconds indicating an increased LE strength and improved balance. Baseline: 6/26: 21.56 seconds with heavy BUE support Goal status: INITIAL  4.   Patient will reduce modified Oswestry score to <20 as to demonstrate minimal disability with ADLs including improved sleeping tolerance, walking/sitting tolerance etc for better mobility with ADLs Baseline: 6/26: 60%  Goal status: INITIAL  5.  Patient will increase 10 meter walk test to >1.38m/s as to improve gait speed for better community ambulation and to reduce fall risk. Baseline: 6/26: unable to ambulate distance Goal status: INITIAL    PLAN: PT FREQUENCY: 2x/week  PT DURATION: 8 weeks  PLANNED INTERVENTIONS: Therapeutic exercises, Therapeutic activity, Neuromuscular re-education, Balance training, Gait training, Patient/Family education, Joint manipulation, Joint mobilization, Stair training, DME instructions, Dry Needling, Spinal mobilization, Cryotherapy, Moist heat, Taping, Traction, Ultrasound, Ionotophoresis 4mg /ml Dexamethasone, and Manual therapy.  PLAN FOR NEXT SESSION: core stabilization, mobilization, LE lengthening.     Janna Arch, PT, DPT  12/21/2021, 8:45 AM

## 2021-12-20 NOTE — Unmapped (Signed)
[facility]  Established Patient Clinic Note    Assessment/Plan:   Problem List Items Addressed This Visit       CHF (congestive heart failure), NYHA class II, chronic, systolic (CMS-HCC)    Diabetes mellitus, type 2 (CMS-HCC)     // Diabetes Type 2: controlled  - Recent Labs:   Lab Results   Component Value Date    Hemoglobin A1C 6.5 (A) 09/14/2021    Hemoglobin A1C 6.4 (A) 06/24/2021    LDL Calculated 51 06/24/2021      - The 10-year ASCVD risk score (Arnett DK, et al., 2019) is: 37.9% on ASA/Statin Prevention Therapy No.  - Treated with: Semaglutide, jardiance and Metformin (Glucophage) 1000mg  BID  - Plan Discussed general issues about diabetes pathophysiology and management.             Relevant Medications    metFORMIN (GLUCOPHAGE) 1000 MG tablet    Hypertension associated with diabetes (CMS-HCC) - Primary     // Hypertension: BP at goal?: at goal below 130/80   BP Readings from Last 3 Encounters:   12/01/21 120/70   11/30/21 140/70   10/24/21 110/72   - Last Labs:    Lab Results   Component Value Date    CREATININE 1.53 (H) 09/14/2021    K 4.4 06/24/2021    NA 142 06/24/2021    CO2 30.7 06/24/2021   - The patient's calculated ASCVD 10 Year Risk Score is 39.7%.   - Treated with: amlodipine (Norvasc) 5mg  and losartan (Cozaar) 50mg     - Instructed to measure home BP weekly and record; instructional handout given.  - Control dietary sodium and daily exercise.  - Follow up: 3 months           Relevant Medications    metFORMIN (GLUCOPHAGE) 1000 MG tablet    Hyperlipidemia associated with type 2 diabetes mellitus (CMS-HCC)     // Hyperlipidemia:    - On atorvastatin 40mg   - Education regarding hyperlipidemia was given. Questions answered.  -   Lab Results   Component Value Date    LDL Calculated 51 06/24/2021    HDL 40 06/24/2021    Triglycerides 107 06/24/2021   - The 10-year ASCVD risk score (Arnett DK, et al., 2019) is: 39.7%            Relevant Medications    metFORMIN (GLUCOPHAGE) 1000 MG tablet PAF (paroxysmal atrial fibrillation) (CMS-HCC)     // Atrial Fibrillation  Pulse Readings from Last 4 Encounters:   12/01/21 68   11/30/21 83   10/24/21 77   09/22/21 76   - Pt rate controled with  Sotalol  - CHADsVASc:  5 points, 7.2% per year  - HASBLED: 1 which corresponds to a 3.4% risk of major-bleeding.  - Pt currently on: Edoxaban for anticoagulation and counseled on risk/benefits of anticoagulation and risk of thrombotic events. Will work with cardiology to have new anticoagulation.            Routine health maintenance    Relevant Orders    Vitamin B12    Vitamin B6    Vitamin B1, Whole Blood    Vitamin D 25 Hydroxy (25OH D2 + D3)    Iron Panel    Benign prostatic hyperplasia with nocturia    Sciatic leg pain     //Back Pain likely due to degenerative disc disease without herniated disc  - [facility]. Will follow up to  trial RFA or other options this week with specialist.   - Recommended light activity as tolerated.   - signed letter for [employer].                   Mood disorder (CMS-HCC)     // Hx of Depression and Anxiety:   - Last GAD 7:      and percent change: GAD 7 change: 0  - Last PHQ9:    - Currently taking: Cymbalta with good effect on sleep.   - Augment with Wellbutrin for mood, weight loss and activation.                Relevant Medications    buPROPion (WELLBUTRIN SR) 100 MG 12 hr tablet     Other Visit Diagnoses       Special screening for malignant neoplasm of colon        Relevant Orders    Colorectal Cancer DNA + FIT    Body mass index (BMI) 30.0-30.9, adult        Relevant Orders    Vitamin D 25 Hydroxy (25OH D2 + D3)    Encounter for screening for malignant neoplasm of prostate                HEALTH MAINTENANCE ITEMS STILL DUE:  Health Maintenance Due   Topic Date Due    COPD Spirometry  Never done    Retinal Eye Exam  Never done    Zoster Vaccines (1 of 2) Never done    DTaP/Tdap/Td Vaccines (1 - Tdap) 10/31/2006    AAA Screening  Never done COVID-19 Vaccine (5 - Moderna risk series) 03/21/2021       Subjective   Paul Velez is a 76 y.o. male  coming to clinic today for the following issues:    Chief Complaint   Patient presents with    discuss medication       HPI    Pain. Sciatica got better for 1 days after injection. Now it is constant. No motivation to do things. Unable to get up. Needs note for [employer]. Watching tablet. Fell at PT.     Unable to get [facility] with cardiology yet. Still has old Xarelto. Was first prescribed by [facility] clinic. Had bad reaction to Xarelto and Eliquis.     Paul Velez is a 76 y.o. male, with the following medical problems as documented above:      ROS    I have reviewed the problem list, medications, and allergies and have updated/reconciled them if needed.    Paul Velez  reports that he quit smoking about 52 years ago. His smoking use included cigarettes. He smoked an average of .5 packs per day. He has never used smokeless tobacco.    Objective     VITALS: BP 116/64  - Pulse 77  - Temp 36.8 ??C (98.3 ??F) (Oral)  - Resp 16  - SpO2 92%     Wt Readings from Last 6 Encounters:   11/30/21 (!) 107.5 kg (237 lb)   09/22/21 (!) 108 kg (238 lb)   09/14/21 (!) 107.5 kg (237 lb)   06/24/21 (!) 107.6 kg (237 lb 3.2 oz)        Physical Exam  Vitals reviewed.   Constitutional:       General: He is not in acute distress.     Appearance: Normal appearance. He is well-developed. He is not diaphoretic.   HENT:      Head:  Normocephalic and atraumatic.      Right Ear: Hearing normal.      Left Ear: Hearing normal.      Nose: Nose normal.   Eyes:      General: No scleral icterus.        Right eye: No discharge.         Left eye: No discharge.      Conjunctiva/sclera: Conjunctivae normal.      Right eye: Right conjunctiva is not injected.      Left eye: Left conjunctiva is not injected.   Neck:      Thyroid: No thyroid mass.      Vascular: No carotid bruit or JVD.      Trachea: Trachea and phonation normal.   Cardiovascular:      Rate and Rhythm: Normal rate and regular rhythm. No extrasystoles are present.     Pulses: Normal pulses.           Radial pulses are 2+ on the right side and 2+ on the left side.      Heart sounds: Murmur heard.   Pulmonary:      Effort: Pulmonary effort is normal. No respiratory distress.      Breath sounds: Wheezing present. No rales.      Comments: Oxygen 2L [place]    Musculoskeletal:         General: No deformity. Normal range of motion.      Cervical back: Normal range of motion.      Right lower leg: No edema.      Left lower leg: No edema.   Skin:     General: Skin is warm and dry.      Capillary Refill: Capillary refill takes less than 2 seconds.      Findings: Lesion and rash present. No bruising or erythema.      Nails: There is no clubbing.   Neurological:      Mental Status: He is alert and oriented to person, place, and time.      Cranial Nerves: No cranial nerve deficit.      Motor: Weakness present.      Gait: Gait normal.   Psychiatric:         Mood and Affect: Mood normal. Mood is not anxious.         Speech: Speech normal.         Behavior: Behavior normal.         Thought Content: Thought content normal.         Judgment: Judgment normal.         LABS/IMAGING  I have reviewed pertinent recent labs and imaging in Epic    Veatrice Kells, MD  [facility]  [facility]   [address] [place], [place] [address]  Telephone [phone]  Fax [phone]

## 2021-12-21 ENCOUNTER — Ambulatory Visit
Payer: 59 | Attending: Student in an Organized Health Care Education/Training Program | Admitting: Student in an Organized Health Care Education/Training Program

## 2021-12-21 ENCOUNTER — Other Ambulatory Visit: Payer: Self-pay

## 2021-12-21 ENCOUNTER — Encounter: Payer: Self-pay | Admitting: Student in an Organized Health Care Education/Training Program

## 2021-12-21 ENCOUNTER — Ambulatory Visit: Payer: 59

## 2021-12-21 ENCOUNTER — Ambulatory Visit
Admission: RE | Admit: 2021-12-21 | Discharge: 2021-12-21 | Disposition: A | Discharge status: Home / Self Care | Payer: 59 | Source: Ambulatory Visit | Attending: Student in an Organized Health Care Education/Training Program | Admitting: Student in an Organized Health Care Education/Training Program

## 2021-12-21 DIAGNOSIS — R262 Difficulty in walking, not elsewhere classified: Secondary | ICD-10-CM

## 2021-12-21 DIAGNOSIS — M5416 Radiculopathy, lumbar region: Secondary | ICD-10-CM | POA: Insufficient documentation

## 2021-12-21 DIAGNOSIS — M48062 Spinal stenosis, lumbar region with neurogenic claudication: Secondary | ICD-10-CM | POA: Insufficient documentation

## 2021-12-21 DIAGNOSIS — G894 Chronic pain syndrome: Secondary | ICD-10-CM | POA: Insufficient documentation

## 2021-12-21 DIAGNOSIS — M6281 Muscle weakness (generalized): Secondary | ICD-10-CM

## 2021-12-21 DIAGNOSIS — G8929 Other chronic pain: Secondary | ICD-10-CM | POA: Diagnosis present

## 2021-12-21 DIAGNOSIS — M5459 Other low back pain: Secondary | ICD-10-CM

## 2021-12-21 MED ORDER — SODIUM CHLORIDE 0.9% FLUSH
2.0000 mL | Freq: Once | INTRAVENOUS | Status: AC
  Administered 2021-12-21: 2 mL

## 2021-12-21 MED ORDER — DEXAMETHASONE SODIUM PHOSPHATE 10 MG/ML IJ SOLN
10.0000 mg | Freq: Once | INTRAMUSCULAR | Status: AC
  Administered 2021-12-21: 10 mg

## 2021-12-21 MED ORDER — ROPIVACAINE HCL 2 MG/ML IJ SOLN
2.0000 mL | Freq: Once | INTRAMUSCULAR | Status: AC
  Administered 2021-12-21: 2 mL via EPIDURAL

## 2021-12-21 MED ORDER — DEXAMETHASONE SODIUM PHOSPHATE 10 MG/ML IJ SOLN
INTRAMUSCULAR | Status: AC
  Filled 2021-12-21: qty 1

## 2021-12-21 MED ORDER — IOHEXOL 180 MG/ML  SOLN
INTRAMUSCULAR | Status: AC
  Filled 2021-12-21: qty 20

## 2021-12-21 MED ORDER — ROPIVACAINE HCL 2 MG/ML IJ SOLN
INTRAMUSCULAR | Status: AC
  Filled 2021-12-21: qty 20

## 2021-12-21 MED ORDER — SODIUM CHLORIDE (PF) 0.9 % IJ SOLN
INTRAMUSCULAR | Status: AC
  Filled 2021-12-21: qty 10

## 2021-12-21 MED ORDER — LIDOCAINE HCL (PF) 2 % IJ SOLN
INTRAMUSCULAR | Status: AC
  Filled 2021-12-21: qty 10

## 2021-12-21 MED ORDER — IOHEXOL 180 MG/ML  SOLN
10.0000 mL | Freq: Once | INTRAMUSCULAR | Status: AC
  Administered 2021-12-21: 10 mL via EPIDURAL

## 2021-12-21 MED ORDER — LIDOCAINE HCL 2 % IJ SOLN
20.0000 mL | Freq: Once | INTRAMUSCULAR | Status: AC
Start: 2021-12-21 — End: 2021-12-21
  Administered 2021-12-21: 100 mg

## 2021-12-21 NOTE — Progress Notes (Signed)
PROVIDER NOTE: Interpretation of information contained herein should be left to medically-trained personnel. Specific patient instructions are provided elsewhere under "Patient Instructions" section of medical record. This document was created in part using STT-dictation technology, any transcriptional errors that may result from this process are unintentional.  Patient: Hood Hatton Type: Established DOB: Sep 02, 1945 MRN: [id number] PCP: Delma Freeze, MD  Service: Procedure DOS: 12/21/2021 Setting: Ambulatory Location: Ambulatory outpatient facility Delivery: Face-to-face Provider: Gillis Santa, MD Specialty: Interventional Pain Management Specialty designation: 09 Location: Outpatient facility Ref. Prov.: Gillis Santa, MD    Primary Reason for Visit: Interventional Pain Management Treatment. CC: Leg Pain (Both legs right is worse ) and Other (Buttocks mainly on the right )   Procedure:           Type: Lumbar epidural steroid injection (LESI) (interlaminar) #2    Laterality: Right   Level:  L4-5 Level.  Imaging: Fluoroscopic guidance Anesthesia: Local anesthesia (1-2% Lidocaine) Anxiolysis: None                 Sedation: None. DOS: 12/21/2021  Performed by: Gillis Santa, MD  Purpose: Diagnostic/Therapeutic Indications: Lumbar radicular pain of intraspinal etiology of more than 4 weeks that has failed to respond to conservative therapy and is severe enough to impact quality of life or function. 1. Spinal stenosis, lumbar region, with neurogenic claudication   2. Chronic radicular lumbar pain   3. Chronic pain syndrome    NAS-11 Pain score:   Pre-procedure: 4 /10   Post-procedure: 0-No pain/10      Position / Prep / Materials:  Position: Prone w/ head of the table raised (slight reverse trendelenburg) to facilitate breathing.  Prep solution: DuraPrep (Iodine Povacrylex [0.7% available iodine] and Isopropyl Alcohol, 74% w/w) Prep Area: Entire Posterior Lumbar Region from  lower scapular tip down to mid buttocks area and from flank to flank. Materials:  Tray: Epidural tray Needle(s):  Type: Epidural needle (Tuohy) Gauge (G):  17 Length: Long (20cm) Qty: 1  Pre-op H&P Assessment:  Mr. Espina is a 76 y.o. (year old), male patient, seen today for interventional treatment. He  has a past surgical history that includes Wrist surgery (Left); Knee surgery (Right); Colonoscopy (08/30/2015); Cataract extraction w/PHACO (Left, 05/12/2019); Finger surgery (Right); and Cataract extraction w/PHACO (Right, 06/02/2019). Mr. Lyu has a current medication list which includes the following prescription(s): albuterol, apple cider vinegar, atorvastatin, celecoxib, duloxetine, empagliflozin, fluticasone-umeclidin-vilant, furosemide, gabapentin, glucose blood, ipratropium-albuterol, lorazepam, losartan, metformin, mirtazapine, ozempic (1 mg/dose), skyrizi, rocklatan, sotalol af, tizanidine, triamcinolone cream, turmeric, edoxaban, gabapentin, hydrochlorothiazide, and hydroxyzine. His primarily concern today is the Leg Pain (Both legs right is worse ) and Other (Buttocks mainly on the right )  Initial Vital Signs:  Pulse/HCG Rate: 75ECG Heart Rate: 72 Temp:  (!) 97 F (36.1 C) Resp: 16 BP: 112/83 SpO2: 92 % (2 liters)  BMI: Estimated body mass index is 34.29 kg/m as calculated from the following:   Height as of this encounter: 5\' 10"  (1.778 m).   Weight as of this encounter: 239 lb (108.4 kg).  Risk Assessment: Allergies: Reviewed. He is allergic to rosuvastatin, ace inhibitors, lisinopril, and simbrinza [brinzolamide-brimonidine].  Allergy Precautions: None required Coagulopathies: Reviewed. None identified.  Blood-thinner therapy: None at this time Active Infection(s): Reviewed. None identified. Mr. Beato is afebrile  Site Confirmation: Mr. Raptis was asked to confirm the procedure and laterality before marking the site Procedure checklist: Completed Consent: Before the  procedure and under the influence of no sedative(s), amnesic(s), or anxiolytics, the  patient was informed of the treatment options, risks and possible complications. To fulfill our ethical and legal obligations, as recommended by the American Medical Association's Code of Ethics, I have informed the patient of my clinical impression; the nature and purpose of the treatment or procedure; the risks, benefits, and possible complications of the intervention; the alternatives, including doing nothing; the risk(s) and benefit(s) of the alternative treatment(s) or procedure(s); and the risk(s) and benefit(s) of doing nothing. The patient was provided information about the general risks and possible complications associated with the procedure. These may include, but are not limited to: failure to achieve desired goals, infection, bleeding, organ or nerve damage, allergic reactions, paralysis, and death. In addition, the patient was informed of those risks and complications associated to Spine-related procedures, such as failure to decrease pain; infection (i.e.: Meningitis, epidural or intraspinal abscess); bleeding (i.e.: epidural hematoma, subarachnoid hemorrhage, or any other type of intraspinal or peri-dural bleeding); organ or nerve damage (i.e.: Any type of peripheral nerve, nerve root, or spinal cord injury) with subsequent damage to sensory, motor, and/or autonomic systems, resulting in permanent pain, numbness, and/or weakness of one or several areas of the body; allergic reactions; (i.e.: anaphylactic reaction); and/or death. Furthermore, the patient was informed of those risks and complications associated with the medications. These include, but are not limited to: allergic reactions (i.e.: anaphylactic or anaphylactoid reaction(s)); adrenal axis suppression; blood sugar elevation that in diabetics may result in ketoacidosis or comma; water retention that in patients with history of congestive heart failure  may result in shortness of breath, pulmonary edema, and decompensation with resultant heart failure; weight gain; swelling or edema; medication-induced neural toxicity; particulate matter embolism and blood vessel occlusion with resultant organ, and/or nervous system infarction; and/or aseptic necrosis of one or more joints. Finally, the patient was informed that Medicine is not an exact science; therefore, there is also the possibility of unforeseen or unpredictable risks and/or possible complications that may result in a catastrophic outcome. The patient indicated having understood very clearly. We have given the patient no guarantees and we have made no promises. Enough time was given to the patient to ask questions, all of which were answered to the patient's satisfaction. Mr. Hjort has indicated that he wanted to continue with the procedure. Attestation: I, the ordering provider, attest that I have discussed with the patient the benefits, risks, side-effects, alternatives, likelihood of achieving goals, and potential problems during recovery for the procedure that I have provided informed consent. Date  Time: 12/21/2021 11:05 AM  Pre-Procedure Preparation:  Monitoring: As per clinic protocol. Respiration, ETCO2, SpO2, BP, heart rate and rhythm monitor placed and checked for adequate function Safety Precautions: Patient was assessed for positional comfort and pressure points before starting the procedure. Time-out: I initiated and conducted the "Time-out" before starting the procedure, as per protocol. The patient was asked to participate by confirming the accuracy of the "Time Out" information. Verification of the correct person, site, and procedure were performed and confirmed by me, the nursing staff, and the patient. "Time-out" conducted as per Joint Commission's Universal Protocol (UP.01.01.01). Time: 1140  Description/Narrative of Procedure:          Target: Epidural space via interlaminar  opening, initially targeting the lower laminar border of the superior vertebral body. Region: Lumbar Approach: Percutaneous paravertebral  Rationale (medical necessity): procedure needed and proper for the diagnosis and/or treatment of the patient's medical symptoms and needs. Procedural Technique Safety Precautions: Aspiration looking for blood return was conducted prior  to all injections. At no point did we inject any substances, as a needle was being advanced. No attempts were made at seeking any paresthesias. Safe injection practices and needle disposal techniques used. Medications properly checked for expiration dates. SDV (single dose vial) medications used. Description of the Procedure: Protocol guidelines were followed. The procedure needle was introduced through the skin, ipsilateral to the reported pain, and advanced to the target area. Bone was contacted and the needle walked caudad, until the lamina was cleared. The epidural space was identified using "loss-of-resistance technique" with 2-3 ml of PF-NaCl (0.9% NSS), in a 5cc LOR glass syringe.  6 cc solution made of 3 cc of preservative-free saline, 2 cc of 0.2% ropivacaine, 1 cc of Decadron 10 mg/cc.   Vitals:   12/21/21 1109 12/21/21 1137 12/21/21 1142 12/21/21 1144  BP: 112/83 (!) 144/91 (!) 145/85 133/87  Pulse: 75     Resp: 16 18 16 18   Temp: (!) 97 F (36.1 C)     TempSrc: Temporal     SpO2: 92% 97% 95% 99%  Weight: 239 lb (108.4 kg)     Height: 5\' 10"  (1.778 m)       Start Time: 1140 hrs. End Time: 1144 hrs.  Imaging Guidance (Spinal):          Type of Imaging Technique: Fluoroscopy Guidance (Spinal) Indication(s): Assistance in needle guidance and placement for procedures requiring needle placement in or near specific anatomical locations not easily accessible without such assistance. Exposure Time: Please see nurses notes. Contrast: Before injecting any contrast, we confirmed that the patient did not have an  allergy to iodine, shellfish, or radiological contrast. Once satisfactory needle placement was completed at the desired level, radiological contrast was injected. Contrast injected under live fluoroscopy. No contrast complications. See chart for type and volume of contrast used. Fluoroscopic Guidance: I was personally present during the use of fluoroscopy. "Tunnel Vision Technique" used to obtain the best possible view of the target area. Parallax error corrected before commencing the procedure. "Direction-depth-direction" technique used to introduce the needle under continuous pulsed fluoroscopy. Once target was reached, antero-posterior, oblique, and lateral fluoroscopic projection used confirm needle placement in all planes. Images permanently stored in EMR. Interpretation: I personally interpreted the imaging intraoperatively. Adequate needle placement confirmed in multiple planes. Appropriate spread of contrast into desired area was observed. No evidence of afferent or efferent intravascular uptake. No intrathecal or subarachnoid spread observed. Permanent images saved into the patient's record.  Antibiotic Prophylaxis:   Anti-infectives (From admission, onward)    None      Indication(s): None identified  Post-operative Assessment:  Post-procedure Vital Signs:  Pulse/HCG Rate: 7574 Temp:  (!) 97 F (36.1 C) Resp: 18 BP: 133/87 SpO2: 99 %  EBL: None  Complications: No immediate post-treatment complications observed by team, or reported by patient.  Note: The patient tolerated the entire procedure well. A repeat set of vitals were taken after the procedure and the patient was kept under observation following institutional policy, for this type of procedure. Post-procedural neurological assessment was performed, showing return to baseline, prior to discharge. The patient was provided with post-procedure discharge instructions, including a section on how to identify potential problems.  Should any problems arise concerning this procedure, the patient was given instructions to immediately contact us, at any time, without hesitation. In any case, we plan to contact the patient by telephone for a follow-up status report regarding this interventional procedure.  Comments:  No additional relevant information.  Plan  of Care  Orders:  Orders Placed This Encounter  Procedures   DG PAIN CLINIC C-ARM 1-60 MIN NO REPORT    Intraoperative interpretation by procedural physician at [facility].    Standing Status:   Standing    Number of Occurrences:   1    Order Specific Question:   Reason for exam:    Answer:   Assistance in needle guidance and placement for procedures requiring needle placement in or near specific anatomical locations not easily accessible without such assistance.    Medications ordered for procedure: Meds ordered this encounter  Medications   iohexol (OMNIPAQUE) 180 MG/ML injection 10 mL    Must be Myelogram-compatible. If not available, you may substitute with a water-soluble, non-ionic, hypoallergenic, myelogram-compatible radiological contrast medium.   lidocaine (XYLOCAINE) 2 % (with pres) injection 400 mg   sodium chloride flush (NS) 0.9 % injection 2 mL   ropivacaine (PF) 2 mg/mL (0.2%) (NAROPIN) injection 2 mL   dexamethasone (DECADRON) injection 10 mg   Medications administered: We administered iohexol, lidocaine, sodium chloride flush, ropivacaine (PF) 2 mg/mL (0.2%), and dexamethasone.  See the medical record for exact dosing, route, and time of administration.  Follow-up plan:   Return for Keep sch. appt.       R L4/5 ESI 10/26/21, 12/21/21  Recent Visits Date Type Provider Dept  12/08/21 Office Visit Edward Jolly, MD [facility]  11/21/21 Office Visit Edward Jolly, MD [facility]  11/17/21 Office Visit Edward Jolly, MD [facility]  10/26/21 Procedure visit Edward Jolly, MD [facility]   10/06/21 Office Visit Edward Jolly, MD [facility]  Showing recent visits within past 90 days and meeting all other requirements Today's Visits Date Type Provider Dept  12/21/21 Procedure visit Edward Jolly, MD [facility]  Showing today's visits and meeting all other requirements Future Appointments Date Type Provider Dept  12/29/21 Appointment Edward Jolly, MD [facility]  Showing future appointments within next 90 days and meeting all other requirements  Disposition: Discharge home  Discharge (Date  Time): 12/21/2021; 1155 hrs.   Primary Care Physician: Maree Erie, MD Location: [facility] Note by: Edward Jolly, MD Date: 12/21/2021; Time: 1:32 PM  Disclaimer:  Medicine is not an exact science. The only guarantee in medicine is that nothing is guaranteed. It is important to note that the decision to proceed with this intervention was based on the information collected from the patient. The Data and conclusions were drawn from the patient's questionnaire, the interview, and the physical examination. Because the information was provided in large part by the patient, it cannot be guaranteed that it has not been purposely or unconsciously manipulated. Every effort has been made to obtain as much relevant data as possible for this evaluation. It is important to note that the conclusions that lead to this procedure are derived in large part from the available data. Always take into account that the treatment will also be dependent on availability of resources and existing treatment guidelines, considered by other Pain Management Practitioners as being common knowledge and practice, at the time of the intervention. For Medico-Legal purposes, it is also important to point out that variation in procedural techniques and pharmacological choices are the acceptable norm. The indications, contraindications, technique, and results of the  above procedure should only be interpreted and judged by a Board-Certified Interventional Pain Specialist with extensive familiarity and expertise in the same exact procedure and technique.

## 2021-12-21 NOTE — Patient Instructions (Signed)
Pain Management Discharge Instructions  General Discharge Instructions :  If you need to reach your doctor call: Monday-Friday 8:00 am - 4:00 pm at [phone] or toll free [phone].  After clinic hours [phone] to have operator reach doctor.  Bring all of your medication bottles to all your appointments in the pain clinic.  To cancel or reschedule your appointment with Pain Management please remember to call 24 hours in advance to avoid a fee.  Refer to the educational materials which you have been given on: General Risks, I had my Procedure. Discharge Instructions, Post Sedation.  Post Procedure Instructions:  The drugs you were given will stay in your system until tomorrow, so for the next 24 hours you should not drive, make any legal decisions or drink any alcoholic beverages.  You may eat anything you prefer, but it is better to start with liquids then soups and crackers, and gradually work up to solid foods.  Please notify your doctor immediately if you have any unusual bleeding, trouble breathing or pain that is not related to your normal pain.  Depending on the type of procedure that was done, some parts of your body may feel week and/or numb.  This usually clears up by tonight or the next day.  Walk with the use of an assistive device or accompanied by an adult for the 24 hours.  You may use ice on the affected area for the first 24 hours.  Put ice in a Ziploc bag and cover with a towel and place against area 15 minutes on 15 minutes off.  You may switch to heat after 24 hours.Epidural Steroid Injection Patient Information  Description: The epidural space surrounds the nerves as they exit the spinal cord.  In some patients, the nerves can be compressed and inflamed by a bulging disc or a tight spinal canal (spinal stenosis).  By injecting steroids into the epidural space, we can bring irritated nerves into direct contact with a potentially helpful medication.  These  steroids act directly on the irritated nerves and can reduce swelling and inflammation which often leads to decreased pain.  Epidural steroids may be injected anywhere along the spine and from the neck to the low back depending upon the location of your pain.   After numbing the skin with local anesthetic (like Novocaine), a small needle is passed into the epidural space slowly.  You may experience a sensation of pressure while this is being done.  The entire block usually last less than 10 minutes.  Conditions which may be treated by epidural steroids:  Low back and leg pain Neck and arm pain Spinal stenosis Post-laminectomy syndrome Herpes zoster (shingles) pain Pain from compression fractures  Preparation for the injection:  Do not eat any solid food or dairy products within 8 hours of your appointment.  You may drink clear liquids up to 3 hours before appointment.  Clear liquids include water, black coffee, juice or soda.  No milk or cream please. You may take your regular medication, including pain medications, with a sip of water before your appointment  Diabetics should hold regular insulin (if taken separately) and take 1/2 normal NPH dos the morning of the procedure.  Carry some sugar containing items with you to your appointment. A driver must accompany you and be prepared to drive you home after your procedure.  Bring all your current medications with your. An IV may be inserted and sedation may be given at the discretion of the physician.     A blood pressure cuff, EKG and other monitors will often be applied during the procedure.  Some patients may need to have extra oxygen administered for a short period. You will be asked to provide medical information, including your allergies, prior to the procedure.  We must know immediately if you are taking blood thinners (like Coumadin/Warfarin)  Or if you are allergic to IV iodine contrast (dye). We must know if you could possible be  pregnant.  Possible side-effects: Bleeding from needle site Infection (rare, may require surgery) Nerve injury (rare) Numbness & tingling (temporary) Difficulty urinating (rare, temporary) Spinal headache ( a headache worse with upright posture) Light -headedness (temporary) Pain at injection site (several days) Decreased blood pressure (temporary) Weakness in arm/leg (temporary) Pressure sensation in back/neck (temporary)  Call if you experience: Fever/chills associated with headache or increased back/neck pain. Headache worsened by an upright position. New onset weakness or numbness of an extremity below the injection site Hives or difficulty breathing (go to the emergency room) Inflammation or drainage at the infection site Severe back/neck pain Any new symptoms which are concerning to you  Please note:  Although the local anesthetic injected can often make your back or neck feel good for several hours after the injection, the pain will likely return.  It takes 3-7 days for steroids to work in the epidural space.  You may not notice any pain relief for at least that one week.  If effective, we will often do a series of three injections spaced 3-6 weeks apart to maximally decrease your pain.  After the initial series, we generally will wait several months before considering a repeat injection of the same type.  If you have any questions, please call [phone] [facility] 

## 2021-12-21 NOTE — Progress Notes (Signed)
Safety precautions to be maintained throughout the outpatient stay will include: orient to surroundings, keep bed in low position, maintain call bell within reach at all times, provide assistance with transfer out of bed and ambulation.  

## 2021-12-22 ENCOUNTER — Telehealth: Payer: Self-pay | Admitting: *Deleted

## 2021-12-22 NOTE — Telephone Encounter (Signed)
Attempted to call for post procedure follow-up. Message left. 

## 2021-12-26 ENCOUNTER — Ambulatory Visit: Admit: 2021-12-26 | Discharge: 2021-12-27 | Payer: MEDICARE

## 2021-12-26 DIAGNOSIS — Z6830 Body mass index (BMI) 30.0-30.9, adult: Principal | ICD-10-CM

## 2021-12-26 DIAGNOSIS — M5430 Sciatica, unspecified side: Principal | ICD-10-CM

## 2021-12-26 DIAGNOSIS — I5022 Chronic systolic (congestive) heart failure: Principal | ICD-10-CM

## 2021-12-26 DIAGNOSIS — I152 Hypertension secondary to endocrine disorders: Principal | ICD-10-CM

## 2021-12-26 DIAGNOSIS — I480 Paroxysmal atrial fibrillation: Principal | ICD-10-CM

## 2021-12-26 DIAGNOSIS — Z1211 Encounter for screening for malignant neoplasm of colon: Principal | ICD-10-CM

## 2021-12-26 DIAGNOSIS — E1169 Type 2 diabetes mellitus with other specified complication: Principal | ICD-10-CM

## 2021-12-26 DIAGNOSIS — E1159 Type 2 diabetes mellitus with other circulatory complications: Principal | ICD-10-CM

## 2021-12-26 DIAGNOSIS — Z0000 Encounter for general adult medical examination without abnormal findings: Principal | ICD-10-CM

## 2021-12-26 DIAGNOSIS — F39 Unspecified mood [affective] disorder: Principal | ICD-10-CM

## 2021-12-26 DIAGNOSIS — R351 Nocturia: Principal | ICD-10-CM

## 2021-12-26 DIAGNOSIS — N401 Enlarged prostate with lower urinary tract symptoms: Principal | ICD-10-CM

## 2021-12-26 DIAGNOSIS — E785 Hyperlipidemia, unspecified: Principal | ICD-10-CM

## 2021-12-26 DIAGNOSIS — Z125 Encounter for screening for malignant neoplasm of prostate: Principal | ICD-10-CM

## 2021-12-26 LAB — COMPREHENSIVE METABOLIC PANEL
ALBUMIN: 3.3 g/dL — ABNORMAL LOW (ref 3.4–5.0)
ALKALINE PHOSPHATASE: 101 U/L (ref 46–116)
ALT (SGPT): 48 U/L (ref 10–49)
ANION GAP: 6 mmol/L (ref 5–14)
AST (SGOT): 35 U/L — ABNORMAL HIGH (ref <=34)
BILIRUBIN TOTAL: 0.3 mg/dL (ref 0.3–1.2)
BLOOD UREA NITROGEN: 23 mg/dL (ref 9–23)
BUN / CREAT RATIO: 16
CALCIUM: 9.3 mg/dL (ref 8.7–10.4)
CHLORIDE: 106 mmol/L (ref 98–107)
CO2: 32.2 mmol/L — ABNORMAL HIGH (ref 20.0–31.0)
CREATININE: 1.46 mg/dL — ABNORMAL HIGH
EGFR CKD-EPI (2021) MALE: 50 mL/min/{1.73_m2} — ABNORMAL LOW (ref >=60)
GLUCOSE RANDOM: 140 mg/dL (ref 70–179)
POTASSIUM: 4.4 mmol/L (ref 3.5–5.1)
PROTEIN TOTAL: 6.6 g/dL (ref 5.7–8.2)
SODIUM: 144 mmol/L (ref 135–145)

## 2021-12-26 LAB — CBC
HEMATOCRIT: 30.2 % — ABNORMAL LOW (ref 39.0–48.0)
HEMOGLOBIN: 9.3 g/dL — ABNORMAL LOW (ref 12.9–16.5)
MEAN CORPUSCULAR HEMOGLOBIN CONC: 30.7 g/dL — ABNORMAL LOW (ref 32.0–36.0)
MEAN CORPUSCULAR HEMOGLOBIN: 25.4 pg — ABNORMAL LOW (ref 25.9–32.4)
MEAN CORPUSCULAR VOLUME: 82.6 fL (ref 77.6–95.7)
MEAN PLATELET VOLUME: 7.8 fL (ref 6.8–10.7)
PLATELET COUNT: 281 10*9/L (ref 150–450)
RED BLOOD CELL COUNT: 3.66 10*12/L — ABNORMAL LOW (ref 4.26–5.60)
RED CELL DISTRIBUTION WIDTH: 17.4 % — ABNORMAL HIGH (ref 12.2–15.2)
WBC ADJUSTED: 10.9 10*9/L (ref 3.6–11.2)

## 2021-12-26 LAB — LIPID PANEL
CHOLESTEROL/HDL RATIO SCREEN: 2.6 (ref 1.0–4.5)
CHOLESTEROL: 125 mg/dL (ref <=200)
HDL CHOLESTEROL: 48 mg/dL (ref 40–60)
LDL CHOLESTEROL CALCULATED: 56 mg/dL (ref 40–99)
NON-HDL CHOLESTEROL: 77 mg/dL (ref 70–130)
TRIGLYCERIDES: 103 mg/dL (ref 0–150)
VLDL CHOLESTEROL CAL: 20.6 mg/dL (ref 12–42)

## 2021-12-26 LAB — PSA: PROSTATE SPECIFIC ANTIGEN: 0.22 ng/mL (ref 0.00–4.00)

## 2021-12-26 LAB — HEMOGLOBIN A1C
ESTIMATED AVERAGE GLUCOSE: 146 mg/dL
HEMOGLOBIN A1C: 6.7 % — ABNORMAL HIGH (ref 4.8–5.6)

## 2021-12-26 LAB — VITAMIN B12: VITAMIN B-12: 8155 pg/mL — ABNORMAL HIGH (ref 211–911)

## 2021-12-26 LAB — IRON PANEL
IRON SATURATION (CALC): 6 %
IRON: 22 ug/dL — ABNORMAL LOW
TOTAL IRON BINDING CAPACITY (CALC): 396.9 ug/dL (ref 250.0–425.0)
TRANSFERRIN: 315 mg/dL

## 2021-12-26 MED ORDER — BUPROPION HCL SR 100 MG TABLET,12 HR SUSTAINED-RELEASE
ORAL_TABLET | Freq: Every day | ORAL | 2 refills | 60 days | Status: CP
Start: 2021-12-26 — End: 2022-12-26

## 2021-12-26 MED ORDER — METFORMIN 1,000 MG TABLET
ORAL_TABLET | Freq: Two times a day (BID) | ORAL | 3 refills | 90 days | Status: CP
Start: 2021-12-26 — End: 2022-12-26

## 2021-12-26 NOTE — Unmapped (Addendum)
//   Diabetes Type 2: controlled  - Recent Labs:   Lab Results   Component Value Date    Hemoglobin A1C 6.5 (A) 09/14/2021    Hemoglobin A1C 6.4 (A) 06/24/2021    LDL Calculated 51 06/24/2021      - The 10-year ASCVD risk score (Arnett DK, et al., 2019) is: 37.9% on ASA/Statin Prevention Therapy No.  - Treated with: Semaglutide, jardiance and Metformin (Glucophage) 1000mg  BID  - Plan Discussed general issues about diabetes pathophysiology and management.

## 2021-12-26 NOTE — Unmapped (Signed)
Thank you for choosing [facility] for your care.    If you need to request medication refills: Please request a refill via [facility] at [email or web address], or have your pharmacy send a request electronically or by sending a fax to [phone].    If you have a question about a recent visit or a plan that was discussed with your provider in the past: Please go to [email or web address] and sign in to your [facility] Chart to send this information. I do my best to respond to messages in in 2-3 business days, but it is not always possible to provide the most timely and complete information.     If you are experiencing any new or worsening symptoms or you would like to talk about changing a medication or medical plan; it is best to schedule an appointment: Please call our clinic at [phone] to schedule an appointment with your provider or the Acute Care Clinic or you can access the [facility] at [phone].     If you have urgent healthcare needs after normal business hours, on weekends, or during holidays:  We have an Acute Care Clinic available by appointment. Call [phone] to schedule an appointment. We offer care for health issues that do not require a trip to the emergency room.   Monday - Thursday (8am to 7pm)  Friday (8am to 5pm)   Saturdays (9am to 1pm)   Call the [facility] 24/7 Nursing Line at [phone] to get nurse advice.      Veatrice Kells, MD  [facility]  [address], [place] [address]  Phone: [phone] Fax: [phone]  [email or web address]

## 2021-12-26 NOTE — Unmapped (Signed)
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 Will follow up to trial RFA or other options this week with specialist.   - Recommended light activity as tolerated.   - signed letter for [employer].

## 2021-12-26 NOTE — Unmapped (Signed)
//   Hypertension: BP at goal?: at goal below 130/80   BP Readings from Last 3 Encounters:   12/01/21 120/70   11/30/21 140/70   10/24/21 110/72     - Last Labs:    Lab Results   Component Value Date    CREATININE 1.53 (H) 09/14/2021    K 4.4 06/24/2021    NA 142 06/24/2021    CO2 30.7 06/24/2021     - The patient's calculated ASCVD 10 Year Risk Score is 39.7%.   - Treated with: amlodipine (Norvasc) 5mg  and losartan (Cozaar) 50mg     - Instructed to measure home BP weekly and record; instructional handout given.  - Control dietary sodium and daily exercise.  - Follow up: 3 months

## 2021-12-26 NOTE — Unmapped (Signed)
//   Hx of Depression and Anxiety:   - Last GAD 7:      and percent change: GAD 7 change: 0  - Last PHQ9:    - Currently taking: Cymbalta with good effect on sleep.   - Augment with Wellbutrin for mood, weight loss and activation.

## 2021-12-26 NOTE — Unmapped (Signed)
//   Hyperlipidemia:    - On atorvastatin 40mg   - Education regarding hyperlipidemia was given. Questions answered.  -   Lab Results   Component Value Date    LDL Calculated 51 06/24/2021    HDL 40 06/24/2021    Triglycerides 107 06/24/2021     - The 10-year ASCVD risk score (Arnett DK, et al., 2019) is: 39.7%

## 2021-12-26 NOTE — Unmapped (Addendum)
//   Atrial Fibrillation  Pulse Readings from Last 4 Encounters:   12/01/21 68   11/30/21 83   10/24/21 77   09/22/21 76     - Pt rate controled with  Sotalol  - CHADsVASc:  5 points, 7.2% per year  - HASBLED: 1 which corresponds to a 3.4% risk of major-bleeding.  - Pt currently on: Edoxaban for anticoagulation and counseled on risk/benefits of anticoagulation and risk of thrombotic events. Will work with cardiology to have new anticoagulation.

## 2021-12-27 ENCOUNTER — Telehealth: Payer: Self-pay | Admitting: Student in an Organized Health Care Education/Training Program

## 2021-12-27 NOTE — Telephone Encounter (Signed)
Returned patient phone call, voicemail left.

## 2021-12-27 NOTE — Telephone Encounter (Signed)
Patient has been having worse pain since procedure done on 12-21-21. Says he did not get phone call the morning after procedure. Please call him

## 2021-12-28 ENCOUNTER — Ambulatory Visit: Payer: 59

## 2021-12-28 DIAGNOSIS — M5459 Other low back pain: Secondary | ICD-10-CM | POA: Diagnosis not present

## 2021-12-28 DIAGNOSIS — R262 Difficulty in walking, not elsewhere classified: Secondary | ICD-10-CM

## 2021-12-28 DIAGNOSIS — M6281 Muscle weakness (generalized): Secondary | ICD-10-CM

## 2021-12-28 DIAGNOSIS — R269 Unspecified abnormalities of gait and mobility: Secondary | ICD-10-CM

## 2021-12-28 NOTE — Therapy (Signed)
OUTPATIENT PHYSICAL THERAPY TREATMENT NOTE   Patient Name: Mario [name] MRN: [id number] DOB:07-Sep-1945, 76 y.o., male Today's Date: 12/28/2021   PT End of Session - 12/28/21 0822     Visit Number 9    Number of Visits 16    Date for PT Re-Evaluation 03/13/22    Authorization Type 8/10 eval 6/26    PT Start Time 0804    PT Stop Time 0843    PT Time Calculation (min) 39 min    Equipment Utilized During Treatment Gait belt    Activity Tolerance Patient tolerated treatment well    Behavior During Therapy WFL for tasks assessed/performed                  Past Medical History:  Diagnosis Date   Arthritis    hands, knees   Atrial fibrillation (HCC)    CHF (congestive heart failure) (HCC)    COPD (chronic obstructive pulmonary disease) ([place])    Diabetes mellitus without complication ([place])    Diabetic neuropathy ([place])    Heart murmur    in past, mild 1969   Hypertension    resolved after weight loss and more activity   Neuromuscular disorder ([place])    neuropathy   Neuropathy    Pulmonary embolism ([place])    approx 2014   Shingles '99   Sleep apnea    CPAP   Wears dentures    full upper and lower   Past Surgical History:  Procedure Laterality Date   CATARACT EXTRACTION W/PHACO Left 05/12/2019   Procedure: CATARACT EXTRACTION PHACO AND INTRAOCULAR LENS PLACEMENT ([place]) LEFTDIABETIC ISTENT INJ 2.43  00:28.6;  Surgeon: Eulogio Bear, MD;  Location: [facility];  Service: Ophthalmology;  Laterality: Left;  Diabetic - oral meds sleep apnea   CATARACT EXTRACTION W/PHACO Right 06/02/2019   Procedure: CATARACT EXTRACTION PHACO AND INTRAOCULAR LENS PLACEMENT ([facility]) ISTENT INJ  RIGHT DIABETIC 1.31  00:25.4;  Surgeon: Eulogio Bear, MD;  Location: [facility];  Service: Ophthalmology;  Laterality: Right;   COLONOSCOPY  08/30/2015   FINGER SURGERY Right    3'rd finger, right hand   KNEE SURGERY Right    screw in knee   WRIST SURGERY Left    metal  removed   Patient Active Problem List   Diagnosis Date Noted   Spinal stenosis, lumbar region, with neurogenic claudication 10/06/2021   Sacroiliac joint pain 10/06/2021   Piriformis muscle pain 10/06/2021   Bilateral primary osteoarthritis of knee 06/11/2019   Chronic radicular lumbar pain 06/11/2019   Cervical facet joint syndrome 06/11/2019   Lumbar facet arthropathy 06/11/2019   Lumbar radiculopathy 06/11/2019   Chronic pain syndrome 06/11/2019   Lung nodule 02/09/2016   Exercise hypoxemia 12/28/2015   Chronic obstructive pulmonary disease ([place]) 03/16/2015   Nuclear sclerosis 03/16/2015   OSA (obstructive sleep apnea) 03/16/2015   Primary open angle glaucoma of both eyes, severe stage 03/16/2015   Type 2 diabetes mellitus without complication, without long-term current use of insulin ([place]) 03/16/2015   Arthralgia of left lower leg 10/29/2014   Osteoarthritis of left knee 09/10/2014   Type 2 diabetes mellitus with diabetic neuropathy ([place]) 08/04/2014   Cataract, nuclear sclerotic senile 04/01/2014   Combined forms of age-related cataract 04/01/2014   Keratitis sicca, bilateral ([place]) 04/01/2014   Primary open angle glaucoma 02/27/2014   Flat feet 01/29/2014   Foot pain, bilateral 01/29/2014   Tinea pedis of both feet 01/29/2014   Ptosis of both eyelids 11/25/2013  Type 2 diabetes mellitus without retinopathy (HCC) 11/25/2013   Onychomycosis 11/19/2013   Chronic systolic heart failure (HCC) 11/11/2013   Emphysema of lung (HCC) 11/11/2013   Essential hypertension 11/11/2013   Screen for colon cancer 11/05/2013   Cardiomyopathy (HCC) 10/08/2013   Systolic HF (heart failure) (HCC) 10/08/2013   Acute systolic CHF (congestive heart failure) (HCC) 09/16/2013   COPD (chronic obstructive pulmonary disease) (HCC) 09/16/2013   HTN (hypertension) 09/16/2013   Obesity 09/16/2013   PAF (paroxysmal atrial fibrillation) (HCC) 09/16/2013   Senile nuclear sclerosis 05/22/2013   Muscle  weakness (generalized) 11/11/2009   Long term current use of anticoagulant therapy 06/17/2009   Pulmonary embolism (HCC) 06/17/2009   ED (erectile dysfunction) of organic origin 05/13/2003    PCP: Maree Erie MD  REFERRING PROVIDER: Filomena Jungling I MD   REFERRING DIAG: back and LE pain   Rationale for Evaluation and Treatment Rehabilitation  THERAPY DIAG:  Other low back pain  Muscle weakness (generalized)  Difficulty in walking, not elsewhere classified  Abnormality of gait and mobility  ONSET DATE: about 3 months ago.   SUBJECTIVE:                                                                                                                                                                                           SUBJECTIVE STATEMENT: Patient reports having increased B LE pain and low back pain since receiving injection last visit and going back to Pain clinic tomorrow for further evaluation. Reports feeling slightly better today.    PERTINENT HISTORY:  Patient presents to physical therapy for low back and LE pain. PMH includes: arthritis, a fib, CHF, COPD, DM, neuropathy, heart murmur, HTN, PE, shingles, spinal stenosis, SI pain, bilateral OA of knees, lumbar facet arthropathy, lumbar radiculopathy, chronic pain syndrome, lung nodule, exercise hypoxemia, glaucoma of bilateral eyes, flat feet, onychomycosis, PAF. Per patient notes patient was recommended surgery by orthopedics but patient declined. Since patient was seen last in this clinic he has been in the ED three times. Patient reports his sciatic pain has been worsening in the past three months.   PAIN:  Are you having pain? Yes: NPRS scale: 6-7/10 Pain location: low back, buttock Pain description: radiating Aggravating factors: walking, moving Relieving factors: rest   PRECAUTIONS: Fall  WEIGHT BEARING RESTRICTIONS No  FALLS:  Has patient fallen in last 6 months? Yes. Number of falls 1  LIVING  ENVIRONMENT: Lives with: lives with their family, lives with their spouse, and lives with their partner Lives in: House/apartment Stairs: Yes: Internal: flight steps; can reach both Has following equipment at home: None  OCCUPATION:  retired  PLOF: Independent with basic ADLs  PATIENT GOALS to have less pain and be able to walk again    OBJECTIVE: all objective information taken at eval unless otherwise indicated  DIAGNOSTIC FINDINGS:  MRI 08/27/21: L3-4: Mild chronic degenerative changes without visible neural compression or progression.   L4-5: Disc bulge. Facet degeneration and hypertrophy. Stenosis of the lateral recesses and foramina could possibly cause neural compression. Foraminal stenosis slightly worse on the right. Slight worsening since 2019. Small synovial cyst previously seen is no longer visible.   L5-S1: Disc and facet degeneration. Bilateral foraminal stenosis could compress either or both L5 nerves. Similar appearance to the prior study.    LUMBAR ROM:   Active  A/PROM  eval  Flexion 41  Extension -4 from neutral*   Right lateral flexion 10 *  Left lateral flexion 10  Right rotation 6  Left rotation 8   (Blank rows = not tested) *pain    LOWER EXTREMITY MMT:    MMT Right eval Left eval  Hip flexion 3+ 3+  Hip extension 2 2  Hip abduction 3- 3-  Hip adduction 3- 3-  Hip internal rotation 2+ 2+  Hip external rotation 2+ 2+  Knee flexion 2+ 2+  Knee extension 3+ 3+  Ankle dorsiflexion 3 3  Ankle plantarflexion 3 3    TODAY'S TREATMENT   Manual:  Hamstring lengthening with leg on PT arm 30 seconds each LE- x 4 each Single knee to chest 30 seconds x 4 sets each LE Figure four stretch 30 seconds each LE x 4 sets (Patient reports "Good pain"  Sciatic nerve glide with leg on PT arm 20x each LE Roller to bilateral IT band, Hamstring, calf, Piriformis and low back  x 15 minutes   TherEx:  Supine:      DKTC using green theraball x 25  reps   PATIENT EDUCATION:  Education details: Pt educated throughout session about proper posture and technique with exercises. Improved exercise technique, movement at target joints, use of target muscles after min to mod verbal, visual, tactile cues.  Person educated: Patient Education method: Explanation, Demonstration, Tactile cues, and Verbal cues Education comprehension: verbalized understanding, returned demonstration, verbal cues required, tactile cues required, and needs further education   HOME EXERCISE PROGRAM:   No updates 11/14/2021 Access Code: [email or web address] URL: [email or web address] Date: 10/24/2021 Prepared by: Janna Arch  Exercises - Supine Lower Trunk Rotation  - 1 x daily - 7 x weekly - 2 sets - 10 reps - 5 hold - Supine Posterior Pelvic Tilt  - 1 x daily - 7 x weekly - 2 sets - 10 reps - 5 hold - Supine Figure 4 Piriformis Stretch  - 1 x daily - 7 x weekly - 2 sets - 10 reps - 5 hold - Seated Hamstring Stretch  - 1 x daily - 7 x weekly - 2 sets - 2 reps - 30 hold  ASSESSMENT:  CLINICAL IMPRESSION: Treatment focused more on ROM/pain control with manual stretching due to patient report of overall increased low back/gluteal pain since injection last week. He reported increased relief post session stating the stretching along with the rolling stick provided his low back, gluteal region, and BLE some relief. Will await his next appointment with pain management and proceed with more core activities as pain allows.  The patient will benefit from skilled physical therapy to decrease pain, improve mobility, and increase strength for improved quality of life.    OBJECTIVE IMPAIRMENTS Abnormal gait,  cardiopulmonary status limiting activity, decreased activity tolerance, decreased balance, decreased coordination, decreased endurance, decreased mobility, difficulty walking, decreased ROM, decreased strength, hypomobility, impaired perceived functional ability,  increased muscle spasms, impaired flexibility, improper body mechanics, postural dysfunction, and pain.   ACTIVITY LIMITATIONS carrying, lifting, bending, sitting, standing, squatting, sleeping, stairs, transfers, bed mobility, bathing, toileting, dressing, reach over head, hygiene/grooming, and locomotion level  PARTICIPATION LIMITATIONS: meal prep, cleaning, laundry, driving, shopping, community activity, and yard work  PERSONAL FACTORS Age, Fitness, Past/current experiences, Time since onset of injury/illness/exacerbation, Transportation, and 3+ comorbidities: arthritis, a fib, CHF, COPD, DM, neuropathy, heart murmur, HTN, PE, shingles, spinal stenosis, SI pain, bilateral OA of knees, lumbar facet arthropathy, lumbar radiculopathy, chronic pain syndrome, lung nodule, exercise hypoxemia, glaucoma of bilateral eyes, flat feet, onychomycosis, PAF  are also affecting patient's functional outcome.   REHAB POTENTIAL: Fair    CLINICAL DECISION MAKING: Evolving/moderate complexity  EVALUATION COMPLEXITY: Moderate   GOALS: Goals reviewed with patient? Yes  SHORT TERM GOALS: Target date: 11/21/2021  Patient will be independent in home exercise program to improve strength/mobility for better functional independence with ADLs. Baseline: 6/26: Hep given Goal status: INITIAL  2.  Patient will report a worst pain of 5/10 on VAS in   back and LE's to improve tolerance with ADLs and reduced symptoms with activities.  Baseline: 6/26: 10/10 Goal status: INITIAL LONG TERM GOALS: Target date: 12/19/2021  Patient will increase FOTO score to equal to or greater than  53   to demonstrate statistically significant improvement in mobility and quality of life.  Baseline: 6/26: 41% Goal status: INITIAL  2.  Patient will report a worst pain of 3/10 on VAS in  low back and LEs  to improve tolerance with ADLs and reduced symptoms with activities.  Baseline:6/26: 10/10  Goal status: INITIAL  3.  Patient (> 79  years old) will complete five times sit to stand test in < 15 seconds indicating an increased LE strength and improved balance. Baseline: 6/26: 21.56 seconds with heavy BUE support Goal status: INITIAL  4.   Patient will reduce modified Oswestry score to <20 as to demonstrate minimal disability with ADLs including improved sleeping tolerance, walking/sitting tolerance etc for better mobility with ADLs Baseline: 6/26: 60%  Goal status: INITIAL  5.  Patient will increase 10 meter walk test to >1.10m/s as to improve gait speed for better community ambulation and to reduce fall risk. Baseline: 6/26: unable to ambulate distance Goal status: INITIAL    PLAN: PT FREQUENCY: 2x/week  PT DURATION: 8 weeks  PLANNED INTERVENTIONS: Therapeutic exercises, Therapeutic activity, Neuromuscular re-education, Balance training, Gait training, Patient/Family education, Joint manipulation, Joint mobilization, Stair training, DME instructions, Dry Needling, Spinal mobilization, Cryotherapy, Moist heat, Taping, Traction, Ultrasound, Ionotophoresis 4mg /ml Dexamethasone, and Manual therapy.  PLAN FOR NEXT SESSION: core stabilization, mobilization, LE lengthening.     12/28/2021, 1:31 PM

## 2021-12-29 ENCOUNTER — Encounter: Payer: Self-pay | Admitting: Student in an Organized Health Care Education/Training Program

## 2021-12-29 ENCOUNTER — Ambulatory Visit
Payer: 59 | Attending: Student in an Organized Health Care Education/Training Program | Admitting: Student in an Organized Health Care Education/Training Program

## 2021-12-29 ENCOUNTER — Telehealth: Payer: Self-pay | Admitting: Podiatry

## 2021-12-29 VITALS — BP 107/82 | HR 81 | Temp 97.2°F | Resp 16 | Ht 70.0 in | Wt 230.0 lb

## 2021-12-29 DIAGNOSIS — M48062 Spinal stenosis, lumbar region with neurogenic claudication: Secondary | ICD-10-CM | POA: Diagnosis present

## 2021-12-29 DIAGNOSIS — M47816 Spondylosis without myelopathy or radiculopathy, lumbar region: Secondary | ICD-10-CM | POA: Diagnosis present

## 2021-12-29 DIAGNOSIS — G894 Chronic pain syndrome: Secondary | ICD-10-CM | POA: Diagnosis present

## 2021-12-29 NOTE — Telephone Encounter (Signed)
Patient called to see if his diabetic shoes have come in

## 2021-12-29 NOTE — Progress Notes (Signed)
Safety precautions to be maintained throughout the outpatient stay will include: orient to surroundings, keep bed in low position, maintain call bell within reach at all times, provide assistance with transfer out of bed and ambulation.  

## 2021-12-29 NOTE — Patient Instructions (Signed)
GENERAL RISKS AND COMPLICATIONS  What are the risk, side effects and possible complications? Generally speaking, most procedures are safe.  However, with any procedure there are risks, side effects, and the possibility of complications.  The risks and complications are dependent upon the sites that are lesioned, or the type of nerve block to be performed.  The closer the procedure is to the spine, the more serious the risks are.  Great care is taken when placing the radio frequency needles, block needles or lesioning probes, but sometimes complications can occur. Infection: Any time there is an injection through the skin, there is a risk of infection.  This is why sterile conditions are used for these blocks.  There are four possible types of infection. Localized skin infection. Central Nervous System Infection-This can be in the form of Meningitis, which can be deadly. Epidural Infections-This can be in the form of an epidural abscess, which can cause pressure inside of the spine, causing compression of the spinal cord with subsequent paralysis. This would require an emergency surgery to decompress, and there are no guarantees that the patient would recover from the paralysis. Discitis-This is an infection of the intervertebral discs.  It occurs in about 1% of discography procedures.  It is difficult to treat and it may lead to surgery.        2. Pain: the needles have to go through skin and soft tissues, will cause soreness.       3. Damage to internal structures:  The nerves to be lesioned may be near blood vessels or    other nerves which can be potentially damaged.       4. Bleeding: Bleeding is more common if the patient is taking blood thinners such as  aspirin, Coumadin, Ticiid, Plavix, etc., or if he/she have some genetic predisposition  such as hemophilia. Bleeding into the spinal canal can cause compression of the spinal  cord with subsequent paralysis.  This would require an emergency  surgery to  decompress and there are no guarantees that the patient would recover from the  paralysis.       5. Pneumothorax:  Puncturing of a lung is a possibility, every time a needle is introduced in  the area of the chest or upper back.  Pneumothorax refers to free air around the  collapsed lung(s), inside of the thoracic cavity (chest cavity).  Another two possible  complications related to a similar event would include: Hemothorax and Chylothorax.   These are variations of the Pneumothorax, where instead of air around the collapsed  lung(s), you may have blood or chyle, respectively.       6. Spinal headaches: They may occur with any procedures in the area of the spine.       7. Persistent CSF (Cerebro-Spinal Fluid) leakage: This is a rare problem, but may occur  with prolonged intrathecal or epidural catheters either due to the formation of a fistulous  track or a dural tear.       8. Nerve damage: By working so close to the spinal cord, there is always a possibility of  nerve damage, which could be as serious as a permanent spinal cord injury with  paralysis.       9. Death:  Although rare, severe deadly allergic reactions known as "Anaphylactic  reaction" can occur to any of the medications used.      10. Worsening of the symptoms:  We can always make thing worse.  What are the chances   of something like this happening? Chances of any of this occuring are extremely low.  By statistics, you have more of a chance of getting killed in a motor vehicle accident: while driving to the hospital than any of the above occurring .  Nevertheless, you should be aware that they are possibilities.  In general, it is similar to taking a shower.  Everybody knows that you can slip, hit your head and get killed.  Does that mean that you should not shower again?  Nevertheless always keep in mind that statistics do not mean anything if you happen to be on the wrong side of them.  Even if a procedure has a 1 (one) in a  1,000,000 (million) chance of going wrong, it you happen to be that one..Also, keep in mind that by statistics, you have more of a chance of having something go wrong when taking medications.  Who should not have this procedure? If you are on a blood thinning medication (e.g. Coumadin, Plavix, see list of "Blood Thinners"), or if you have an active infection going on, you should not have the procedure.  If you are taking any blood thinners, please inform your physician.  How should I prepare for this procedure? Do not eat or drink anything at least six hours prior to the procedure. Bring a driver with you .  It cannot be a taxi. Come accompanied by an adult that can drive you back, and that is strong enough to help you if your legs get weak or numb from the local anesthetic. Take all of your medicines the morning of the procedure with just enough water to swallow them. If you have diabetes, make sure that you are scheduled to have your procedure done first thing in the morning, whenever possible. If you have diabetes, take only half of your insulin dose and notify our nurse that you have done so as soon as you arrive at the clinic. If you are diabetic, but only take blood sugar pills (oral hypoglycemic), then do not take them on the morning of your procedure.  You may take them after you have had the procedure. Do not take aspirin or any aspirin-containing medications, at least eleven (11) days prior to the procedure.  They may prolong bleeding. Wear loose fitting clothing that may be easy to take off and that you would not mind if it got stained with Betadine or blood. Do not wear any jewelry or perfume Remove any nail coloring.  It will interfere with some of our monitoring equipment.  NOTE: Remember that this is not meant to be interpreted as a complete list of all possible complications.  Unforeseen problems may occur.  BLOOD THINNERS The following drugs contain aspirin or other products,  which can cause increased bleeding during surgery and should not be taken for 2 weeks prior to and 1 week after surgery.  If you should need take something for relief of minor pain, you may take acetaminophen which is found in Tylenol,m Datril, Anacin-3 and Panadol. It is not blood thinner. The products listed below are.  Do not take any of the products listed below in addition to any listed on your instruction sheet.  A.P.C or A.P.C with Codeine Codeine Phosphate Capsules #3 Ibuprofen Ridaura  ABC compound Congesprin Imuran rimadil  Advil Cope Indocin Robaxisal  Alka-Seltzer Effervescent Pain Reliever and Antacid Coricidin or Coricidin-D  Indomethacin Rufen  Alka-Seltzer plus Cold Medicine Cosprin Ketoprofen S-A-C Tablets  Anacin Analgesic Tablets or Capsules Coumadin   Korlgesic Salflex  Anacin Extra Strength Analgesic tablets or capsules CP-2 Tablets Lanoril Salicylate  Anaprox Cuprimine Capsules Levenox Salocol  Anexsia-D Dalteparin Magan Salsalate  Anodynos Darvon compound Magnesium Salicylate Sine-off  Ansaid Dasin Capsules Magsal Sodium Salicylate  Anturane Depen Capsules Marnal Soma  APF Arthritis pain formula Dewitt's Pills Measurin Stanback  Argesic Dia-Gesic Meclofenamic Sulfinpyrazone  Arthritis Bayer Timed Release Aspirin Diclofenac Meclomen Sulindac  Arthritis pain formula Anacin Dicumarol Medipren Supac  Analgesic (Safety coated) Arthralgen Diffunasal Mefanamic Suprofen  Arthritis Strength Bufferin Dihydrocodeine Mepro Compound Suprol  Arthropan liquid Dopirydamole Methcarbomol with Aspirin Synalgos  ASA tablets/Enseals Disalcid Micrainin Tagament  Ascriptin Doan's Midol Talwin  Ascriptin A/D Dolene Mobidin Tanderil  Ascriptin Extra Strength Dolobid Moblgesic Ticlid  Ascriptin with Codeine Doloprin or Doloprin with Codeine Momentum Tolectin  Asperbuf Duoprin Mono-gesic Trendar  Aspergum Duradyne Motrin or Motrin IB Triminicin  Aspirin plain, buffered or enteric coated  Durasal Myochrisine Trigesic  Aspirin Suppositories Easprin Nalfon Trillsate  Aspirin with Codeine Ecotrin Regular or Extra Strength Naprosyn Uracel  Atromid-S Efficin Naproxen Ursinus  Auranofin Capsules Elmiron Neocylate Vanquish  Axotal Emagrin Norgesic Verin  Azathioprine Empirin or Empirin with Codeine Normiflo Vitamin E  Azolid Emprazil Nuprin Voltaren  Bayer Aspirin plain, buffered or children's or timed BC Tablets or powders Encaprin Orgaran Warfarin Sodium  Buff-a-Comp Enoxaparin Orudis Zorpin  Buff-a-Comp with Codeine Equegesic Os-Cal-Gesic   Buffaprin Excedrin plain, buffered or Extra Strength Oxalid   Bufferin Arthritis Strength Feldene Oxphenbutazone   Bufferin plain or Extra Strength Feldene Capsules Oxycodone with Aspirin   Bufferin with Codeine Fenoprofen Fenoprofen Pabalate or Pabalate-SF   Buffets II Flogesic Panagesic   Buffinol plain or Extra Strength Florinal or Florinal with Codeine Panwarfarin   Buf-Tabs Flurbiprofen Penicillamine   Butalbital Compound Four-way cold tablets Penicillin   Butazolidin Fragmin Pepto-Bismol   Carbenicillin Geminisyn Percodan   Carna Arthritis Reliever Geopen Persantine   Carprofen Gold's salt Persistin   Chloramphenicol Goody's Phenylbutazone   Chloromycetin Haltrain Piroxlcam   Clmetidine heparin Plaquenil   Cllnoril Hyco-pap Ponstel   Clofibrate Hydroxy chloroquine Propoxyphen         Before stopping any of these medications, be sure to consult the physician who ordered them.  Some, such as Coumadin (Warfarin) are ordered to prevent or treat serious conditions such as "deep thrombosis", "pumonary embolisms", and other heart problems.  The amount of time that you may need off of the medication may also vary with the medication and the reason for which you were taking it.  If you are taking any of these medications, please make sure you notify your pain physician before you undergo any procedures.         Facet Blocks Patient  Information  Description: The facets are joints in the spine between the vertebrae.  Like any joints in the body, facets can become irritated and painful.  Arthritis can also effect the facets.  By injecting steroids and local anesthetic in and around these joints, we can temporarily block the nerve supply to them.  Steroids act directly on irritated nerves and tissues to reduce selling and inflammation which often leads to decreased pain.  Facet blocks may be done anywhere along the spine from the neck to the low back depending upon the location of your pain.   After numbing the skin with local anesthetic (like Novocaine), a small needle is passed onto the facet joints under x-ray guidance.  You may experience a sensation of pressure while this is being done.  The   entire block usually lasts about 15-25 minutes.   Conditions which may be treated by facet blocks:  Low back/buttock pain Neck/shoulder pain Certain types of headaches  Preparation for the injection:  Do not eat any solid food or dairy products within 8 hours of your appointment. You may drink clear liquid up to 3 hours before appointment.  Clear liquids include water, black coffee, juice or soda.  No milk or cream please. You may take your regular medication, including pain medications, with a sip of water before your appointment.  Diabetics should hold regular insulin (if taken separately) and take 1/2 normal NPH dose the morning of the procedure.  Carry some sugar containing items with you to your appointment. A driver must accompany you and be prepared to drive you home after your procedure. Bring all your current medications with you. An IV may be inserted and sedation may be given at the discretion of the physician. A blood pressure cuff, EKG and other monitors will often be applied during the procedure.  Some patients may need to have extra oxygen administered for a short period. You will be asked to provide medical information,  including your allergies and medications, prior to the procedure.  We must know immediately if you are taking blood thinners (like Coumadin/Warfarin) or if you are allergic to IV iodine contrast (dye).  We must know if you could possible be pregnant.  Possible side-effects:  Bleeding from needle site Infection (rare, may require surgery) Nerve injury (rare) Numbness & tingling (temporary) Difficulty urinating (rare, temporary) Spinal headache (a headache worse with upright posture) Light-headedness (temporary) Pain at injection site (serveral days) Decreased blood pressure (rare, temporary) Weakness in arm/leg (temporary) Pressure sensation in back/neck (temporary)   Call if you experience:  Fever/chills associated with headache or increased back/neck pain Headache worsened by an upright position New onset, weakness or numbness of an extremity below the injection site Hives or difficulty breathing (go to the emergency room) Inflammation or drainage at the injection site(s) Severe back/neck pain greater than usual New symptoms which are concerning to you  Please note:  Although the local anesthetic injected can often make your back or neck feel good for several hours after the injection, the pain will likely return. It takes 3-7 days for steroids to work.  You may not notice any pain relief for at least one week.  If effective, we will often do a series of 2-3 injections spaced 3-6 weeks apart to maximally decrease your pain.  After the initial series, you may be a candidate for a more permanent nerve block of the facets.  If you have any questions, please call [phone] [facility] 

## 2021-12-29 NOTE — Progress Notes (Signed)
PROVIDER NOTE: Information contained herein reflects review and annotations entered in association with encounter. Interpretation of such information and data should be left to medically-trained personnel. Information provided to patient can be located elsewhere in the medical record under "Patient Instructions". Document created using STT-dictation technology, any transcriptional errors that may result from process are unintentional.    Patient: Mario [name]  Service Category: E/M  Provider: Gillis Santa, MD  DOB: [birth year]  DOS: 12/29/2021  Referring Provider: Delma Freeze, MD  MRN: [id number]  Specialty: Interventional Pain Management  PCP: Delma Freeze, MD  Type: Established Patient  Setting: Ambulatory outpatient    Location: Office  Delivery: Face-to-face     HPI  Mario [name], a 76 y.o. year old male, is here today because of his Lumbar facet arthropathy [M47.816]. Mr. Channing primary complain today is Back Pain (Lower bilat) Last encounter: My last encounter with him was on 12/27/2021. Pertinent problems: Mr. Paskett has Chronic radicular lumbar pain; Cervical facet joint syndrome; Lumbar facet arthropathy; Lumbar radiculopathy; Chronic pain syndrome; Spinal stenosis, lumbar region, with neurogenic claudication; Sacroiliac joint pain; and Piriformis muscle pain on their pertinent problem list. Pain Assessment: Severity of Chronic pain is reported as a 4 /10. Location: Back Lower, Left, Right/through hips to ankles bilat; RIGHT side is worse. Onset:  . Quality: Aching, Constant, Restless, Tingling, Sharp. Timing: Constant. Modifying factor(s): laying down in bed, change positions, PT. Vitals:  height is '5\' 10"'  (1.778 m) and weight is 230 lb (104.3 kg). His temporal temperature is 97.2 F (36.2 C) (abnormal). His blood pressure is 107/82 and his pulse is 81. His respiration is 16 and oxygen saturation is 100%.   Reason for encounter: post-procedure evaluation and assessment.     Post-procedure evaluation   Type: Lumbar epidural steroid injection (LESI) (interlaminar) #2    Laterality: Right   Level:  L4-5 Level.  Imaging: Fluoroscopic guidance Anesthesia: Local anesthesia (1-2% Lidocaine) Anxiolysis: None                 Sedation: None. DOS: 12/21/2021  Performed by: Gillis Santa, MD  Purpose: Diagnostic/Therapeutic Indications: Lumbar radicular pain of intraspinal etiology of more than 4 weeks that has failed to respond to conservative therapy and is severe enough to impact quality of life or function. 1. Spinal stenosis, lumbar region, with neurogenic claudication   2. Chronic radicular lumbar pain   3. Chronic pain syndrome    NAS-11 Pain score:   Pre-procedure: 4 /10   Post-procedure: 0-No pain/10      Effectiveness:  Initial hour after procedure: 100 %  Subsequent 4-6 hours post-procedure: 0 %  Analgesia past initial 6 hours: 0 %  Ongoing improvement:  Analgesic:  0%  ROS  Constitutional: Denies any fever or chills Gastrointestinal: No reported hemesis, hematochezia, vomiting, or acute GI distress Musculoskeletal:  Low back, bilateral buttock, bilateral thigh right greater than left leg pain Neurological: No reported episodes of acute onset apraxia, aphasia, dysarthria, agnosia, amnesia, paralysis, loss of coordination, or loss of consciousness  Medication Review  Apple Cider Vinegar, DULoxetine, Fluticasone-Umeclidin-Vilant, Netarsudil-Latanoprost, Risankizumab-rzaa, SOTALOL AF, Semaglutide (1 MG/DOSE), Turmeric, albuterol, atorvastatin, celecoxib, empagliflozin, furosemide, gabapentin, glucose blood, hydrochlorothiazide, ipratropium-albuterol, losartan, metFORMIN, mirtazapine, tiZANidine, and triamcinolone cream  History Review  Allergy: Mario [name] is allergic to rosuvastatin, ace inhibitors, lisinopril, and simbrinza [brinzolamide-brimonidine]. Drug: Mario [name]  reports no history of drug use. Alcohol:  reports no history of alcohol  use. Tobacco:  reports that he quit smoking about 52 years ago.  His smoking use included cigarettes. He has a 0.50 pack-year smoking history. He has never used smokeless tobacco. Social: Mario [name]  reports that he quit smoking about 52 years ago. His smoking use included cigarettes. He has a 0.50 pack-year smoking history. He has never used smokeless tobacco. He reports that he does not drink alcohol and does not use drugs. Medical:  has a past medical history of Arthritis, Atrial fibrillation ([place]), CHF (congestive heart failure) ([place]), COPD (chronic obstructive pulmonary disease) ([place]), Diabetes mellitus without complication ([place]), Diabetic neuropathy ([place]), Heart murmur, Hypertension, Neuromuscular disorder ([place]), Neuropathy, Pulmonary embolism ([place]), Shingles ('99), Sleep apnea, and Wears dentures. Surgical: Mario [name]  has a past surgical history that includes Wrist surgery (Left); Knee surgery (Right); Colonoscopy (08/30/2015); Cataract extraction w/PHACO (Left, 05/12/2019); Finger surgery (Right); and Cataract extraction w/PHACO (Right, 06/02/2019). Family: family history is not on file.  Laboratory Chemistry Profile   Renal Lab Results  Component Value Date   BUN 22 06/07/2021   CREATININE 1.29 (H) 06/07/2021   BCR 20 02/15/2016   GFRAA 69 02/15/2016   GFRNONAA 58 (L) 06/07/2021    Hepatic Lab Results  Component Value Date   ALBUMIN 4.3 02/15/2016    Electrolytes Lab Results  Component Value Date   NA 138 06/07/2021   K 4.8 06/07/2021   CL 103 06/07/2021   CALCIUM 8.6 (L) 06/07/2021   PHOS 3.2 02/15/2016    Bone No results found for: "VD25OH", "VD125OH2TOT", "ME2683MH9", "QQ2297LG9", "25OHVITD1", "25OHVITD2", "25OHVITD3", "TESTOFREE", "TESTOSTERONE"  Inflammation (CRP: Acute Phase) (ESR: Chronic Phase) No results found for: "CRP", "ESRSEDRATE", "LATICACIDVEN"       Note: Above Lab results reviewed.  Recent Imaging Review   CLINICAL DATA:  Chronic low back pain radiating to  both legs over the last 6 weeks.   EXAM: MRI LUMBAR SPINE WITHOUT CONTRAST   TECHNIQUE: Multiplanar, multisequence MR imaging of the lumbar spine was performed. No intravenous contrast was administered.   COMPARISON:  04/03/2018   FINDINGS: Segmentation:  5 lumbar type vertebral bodies.   Alignment:  Normal   Vertebrae:  Normal   Conus medullaris and cauda equina: Conus extends to the L1-2 level. Conus and cauda equina appear normal.   Paraspinal and other soft tissues: Normal   Disc levels:   Minimal non-compressive disc bulges at L2-3 and above, unchanged since the prior study.   L3-4: Desiccation of the disc with circumferential disc bulge. Mild facet and ligamentous prominence. Mild narrowing of the lateral recesses and neural foramina but without visible neural compression. No change.   L4-5: Circumferential disc bulge. Bilateral facet osteoarthritis. Small synovial cyst previously seen projecting medial from the right facet is no longer visible. No central canal stenosis. Mild narrowing of both lateral recesses. Foraminal stenosis right worse than left. L4 nerve compression could occur, particularly on the right. Findings in general appear to have worsened slightly.   L5-S1: Disc degeneration with loss of disc height. Endplate osteophytes and bulging of the disc. No central canal stenosis. Mild facet hypertrophy. Bilateral foraminal stenosis due to encroachment by osteophyte and bulging disc material could compress either or both L5 nerves. Similar appearance to the prior exam.   IMPRESSION: L3-4: Mild chronic degenerative changes without visible neural compression or progression.   L4-5: Disc bulge. Facet degeneration and hypertrophy. Stenosis of the lateral recesses and foramina could possibly cause neural compression. Foraminal stenosis slightly worse on the right. Slight worsening since 2019. Small synovial cyst previously seen is no longer visible.  L5-S1: Disc and facet degeneration. Bilateral foraminal stenosis could compress either or both L5 nerves. Similar appearance to the prior study.     Electronically Signed   By: Nelson Chimes M.D.   On: 08/29/2021 10:21     Physical Exam  General appearance: Well nourished, well developed, and well hydrated. In no apparent acute distress Mental status: Alert, oriented x 3 (person, place, & time)       Respiratory: No evidence of acute respiratory distress on oxygen, nasal cannula Eyes: PERLA Vitals: BP 107/82   Pulse 81   Temp (!) 97.2 F (36.2 C) (Temporal)   Resp 16   Ht '5\' 10"'  (1.778 m)   Wt 230 lb (104.3 kg)   SpO2 100% Comment: 2L O2 from home  BMI 33.00 kg/m  BMI: Estimated body mass index is 33 kg/m as calculated from the following:   Height as of this encounter: '5\' 10"'  (1.778 m).   Weight as of this encounter: 230 lb (104.3 kg). Ideal: Ideal body weight: 73 kg (160 lb 15 oz) Adjusted ideal body weight: 85.5 kg (188 lb 9 oz)  Lumbar Spine Area Exam  Skin & Axial Inspection: Thoraco-lumbar Scoliosis Alignment: Symmetrical Functional ROM: Pain restricted ROM, patient bent forward       Stability: No instability detected Muscle Tone/Strength: Functionally intact. No obvious neuro-muscular anomalies detected. Sensory (Neurological): Musculoskeletal pain pattern Palpation: No palpable anomalies       Provocative Tests: Hyperextension/rotation test: (+) bilaterally for facet joint pain. Lumbar quadrant test (Kemp's test): (+) bilaterally for facet joint pain.  Gait & Posture Assessment  Ambulation: Patient came in today in a wheel chair Gait: Significantly limited. Dependent on assistive device to ambulate Posture: Difficulty standing up straight, due to pain  Lower Extremity Exam    Side: Right lower extremity  Side: Left lower extremity  Stability: No instability observed          Stability: No instability observed          Skin & Extremity Inspection: Skin  color, temperature, and hair growth are WNL. No peripheral edema or cyanosis. No masses, redness, swelling, asymmetry, or associated skin lesions. No contractures.  Skin & Extremity Inspection: Skin color, temperature, and hair growth are WNL. No peripheral edema or cyanosis. No masses, redness, swelling, asymmetry, or associated skin lesions. No contractures.  Functional ROM: Pain restricted ROM for hip and knee joints          Functional ROM: Pain restricted ROM for hip and knee joints          Muscle Tone/Strength: Functionally intact. No obvious neuro-muscular anomalies detected.  Muscle Tone/Strength: Functionally intact. No obvious neuro-muscular anomalies detected.  Sensory (Neurological): Unimpaired        Sensory (Neurological): Unimpaired        DTR: Patellar: deferred today Achilles: deferred today Plantar: deferred today  DTR: Patellar: deferred today Achilles: deferred today Plantar: deferred today  Palpation: No palpable anomalies  Palpation: No palpable anomalies    Assessment   Diagnosis Status  1. Lumbar facet arthropathy   2. Spinal stenosis, lumbar region, with neurogenic claudication   3. Chronic pain syndrome    Persistent Persistent Persistent   Updated Problems: No problems updated.  [place] has a history of greater than 3 months of moderate to severe pain which is resulted in functional impairment.  The patient has tried various conservative therapeutic options such as NSAIDs, Tylenol, muscle relaxants, physical therapy which was inadequately  effective.  Patient's pain is predominantly axial with physical exam findings and L-MRI of facet arthropathy. Lumbar facet medial branch nerve blocks were discussed with the patient.  Risks and benefits were reviewed.  Patient would like to proceed with bilateral L3, L4, L5 medial branch nerve block.  Continue nonnarcotic based medication regimen which includes gabapentin 800 mg 3 times a day, Cymbalta  60 mg daily, tizanidine 2 mg 3 times a day as needed muscle spasms as well as turmeric.  Orders:  Orders Placed This Encounter  Procedures   LUMBAR FACET(MEDIAL BRANCH NERVE BLOCK) MBNB    Standing Status:   Future    Standing Expiration Date:   03/30/2022    Scheduling Instructions:     Procedure: Lumbar facet block (AKA.: Lumbosacral medial branch nerve block)     Side: Bilateral     Level: L3-4 & L4-5 Facets (L3, L4, L5, Medial Branch Nerves)     Sedation: PO Valium     Timeframe: ASAA    Order Specific Question:   Where will this procedure be performed?    Answer:   [facility]   Follow-up plan:   Return in about 2 weeks (around 01/12/2022) for B/L L3, 4, 5 MBNB #1 , in clinic (PO Valium).     R L4/5 ESI 10/26/21, 12/21/21   Recent Visits Date Type Provider Dept  12/21/21 Procedure visit Gillis Santa, MD [facility]  12/08/21 Office Visit Gillis Santa, MD [facility]  11/21/21 Office Visit Gillis Santa, MD [facility]  11/17/21 Office Visit Gillis Santa, MD [facility]  10/26/21 Procedure visit Gillis Santa, MD [facility]  10/06/21 Office Visit Gillis Santa, MD [facility]  Showing recent visits within past 90 days and meeting all other requirements Today's Visits Date Type Provider Dept  12/29/21 Office Visit Gillis Santa, MD [facility]  Showing today's visits and meeting all other requirements Future Appointments No visits were found meeting these conditions. Showing future appointments within next 90 days and meeting all other requirements  I discussed the assessment and treatment plan with the patient. The patient was provided an opportunity to ask questions and all were answered. The patient agreed with the plan and demonstrated an understanding of the instructions.  Patient advised to call back or seek an in-person evaluation if the symptoms or condition worsens.  Duration of  encounter: 12mnutes.  Total time on encounter, as per AMA guidelines included both the face-to-face and non-face-to-face time personally spent by the physician and/or other qualified health care professional(s) on the day of the encounter (includes time in activities that require the physician or other qualified health care professional and does not include time in activities normally performed by clinical staff). Physician's time may include the following activities when performed: preparing to see the patient (eg, review of tests, pre-charting review of records) obtaining and/or reviewing separately obtained history performing a medically appropriate examination and/or evaluation counseling and educating the patient/family/caregiver ordering medications, tests, or procedures referring and communicating with other health care professionals (when not separately reported) documenting clinical information in the electronic or other health record independently interpreting results (not separately reported) and communicating results to the patient/ family/caregiver care coordination (not separately reported)  Note by: BGillis Santa MD Date: 12/29/2021; Time: 9:17 AM

## 2021-12-31 LAB — VITAMIN D 25 HYDROXY: VITAMIN D, TOTAL (25OH): 63.3 ng/mL (ref 20.0–80.0)

## 2022-01-03 NOTE — Therapy (Signed)
OUTPATIENT PHYSICAL THERAPY TREATMENT NOTE/Physical Therapy Progress Note   Dates of reporting period  10/24/2021   to   01/04/2022   Patient Name: Mario [name] MRN: [id number] DOB:02/18/1946, 76 y.o., male Today's Date: 01/04/2022   PT End of Session - 01/04/22 0807     Visit Number 10    Number of Visits 16    Date for PT Re-Evaluation 03/13/22    Authorization Type 8/10 eval 6/26    PT Start Time 0803    PT Stop Time 0843    PT Time Calculation (min) 40 min    Equipment Utilized During Treatment Gait belt    Activity Tolerance Patient tolerated treatment well    Behavior During Therapy WFL for tasks assessed/performed                   Past Medical History:  Diagnosis Date   Arthritis    hands, knees   Atrial fibrillation (HCC)    CHF (congestive heart failure) (HCC)    COPD (chronic obstructive pulmonary disease) ([place])    Diabetes mellitus without complication ([place])    Diabetic neuropathy ([place])    Heart murmur    in past, mild 1969   Hypertension    resolved after weight loss and more activity   Neuromuscular disorder ([place])    neuropathy   Neuropathy    Pulmonary embolism ([place])    approx 2014   Shingles '99   Sleep apnea    CPAP   Wears dentures    full upper and lower   Past Surgical History:  Procedure Laterality Date   CATARACT EXTRACTION W/PHACO Left 05/12/2019   Procedure: CATARACT EXTRACTION PHACO AND INTRAOCULAR LENS PLACEMENT ([place]) LEFTDIABETIC ISTENT INJ 2.43  00:28.6;  Surgeon: Eulogio Bear, MD;  Location: [facility];  Service: Ophthalmology;  Laterality: Left;  Diabetic - oral meds sleep apnea   CATARACT EXTRACTION W/PHACO Right 06/02/2019   Procedure: CATARACT EXTRACTION PHACO AND INTRAOCULAR LENS PLACEMENT ([facility]) ISTENT INJ  RIGHT DIABETIC 1.31  00:25.4;  Surgeon: Eulogio Bear, MD;  Location: [facility];  Service: Ophthalmology;  Laterality: Right;   COLONOSCOPY  08/30/2015   FINGER SURGERY Right    3'rd  finger, right hand   KNEE SURGERY Right    screw in knee   WRIST SURGERY Left    metal removed   Patient Active Problem List   Diagnosis Date Noted   Spinal stenosis, lumbar region, with neurogenic claudication 10/06/2021   Sacroiliac joint pain 10/06/2021   Piriformis muscle pain 10/06/2021   Bilateral primary osteoarthritis of knee 06/11/2019   Chronic radicular lumbar pain 06/11/2019   Cervical facet joint syndrome 06/11/2019   Lumbar facet arthropathy 06/11/2019   Lumbar radiculopathy 06/11/2019   Chronic pain syndrome 06/11/2019   Lung nodule 02/09/2016   Exercise hypoxemia 12/28/2015   Chronic obstructive pulmonary disease ([place]) 03/16/2015   Nuclear sclerosis 03/16/2015   OSA (obstructive sleep apnea) 03/16/2015   Primary open angle glaucoma of both eyes, severe stage 03/16/2015   Type 2 diabetes mellitus without complication, without long-term current use of insulin ([place]) 03/16/2015   Arthralgia of left lower leg 10/29/2014   Osteoarthritis of left knee 09/10/2014   Type 2 diabetes mellitus with diabetic neuropathy ([place]) 08/04/2014   Cataract, nuclear sclerotic senile 04/01/2014   Combined forms of age-related cataract 04/01/2014   Keratitis sicca, bilateral ([place]) 04/01/2014   Primary open angle glaucoma 02/27/2014   Flat feet 01/29/2014   Foot pain, bilateral  01/29/2014   Tinea pedis of both feet 01/29/2014   Ptosis of both eyelids 11/25/2013   Type 2 diabetes mellitus without retinopathy ([place]) 11/25/2013   Onychomycosis 36/64/4034   Chronic systolic heart failure ([place]) 11/11/2013   Emphysema of lung ([place]) 11/11/2013   Essential hypertension 11/11/2013   Screen for colon cancer 11/05/2013   Cardiomyopathy ([place]) 74/25/9563   Systolic HF (heart failure) ([place]) 87/56/4332   Acute systolic CHF (congestive heart failure) ([place]) 09/16/2013   COPD (chronic obstructive pulmonary disease) ([place]) 09/16/2013   HTN (hypertension) 09/16/2013   Obesity 09/16/2013   PAF  (paroxysmal atrial fibrillation) ([place]) 09/16/2013   Senile nuclear sclerosis 05/22/2013   Muscle weakness (generalized) 11/11/2009   Long term current use of anticoagulant therapy 06/17/2009   Pulmonary embolism ([place]) 06/17/2009   ED (erectile dysfunction) of organic origin 05/13/2003    PCP: Delma Freeze MD  REFERRING PROVIDER: Girtha Hake I MD   REFERRING DIAG: back and LE pain   Rationale for Evaluation and Treatment Rehabilitation  THERAPY DIAG:  Other low back pain  Muscle weakness (generalized)  Difficulty in walking, not elsewhere classified  Abnormality of gait and mobility  ONSET DATE: about 3 months ago.   SUBJECTIVE:                                                                                                                                                                                           SUBJECTIVE STATEMENT: Patient reports now scheduled to see neurosurgeon tomorrow due to ongoing low back/Right sided buttock pain. Reports one fall since last visit - fell out of bed. Reports pain is worse when he was on his knees    PERTINENT HISTORY:  Patient presents to physical therapy for low back and LE pain. PMH includes: arthritis, a fib, CHF, COPD, DM, neuropathy, heart murmur, HTN, PE, shingles, spinal stenosis, SI pain, bilateral OA of knees, lumbar facet arthropathy, lumbar radiculopathy, chronic pain syndrome, lung nodule, exercise hypoxemia, glaucoma of bilateral eyes, flat feet, onychomycosis, PAF. Per patient notes patient was recommended surgery by orthopedics but patient declined. Since patient was seen last in this clinic he has been in the ED three times. Patient reports his sciatic pain has been worsening in the past three months.   PAIN:  Are you having pain? Yes: NPRS scale: 6-7/10 Pain location: low back, buttock Pain description: radiating Aggravating factors: walking, moving Relieving factors: rest   PRECAUTIONS: Fall  WEIGHT  BEARING RESTRICTIONS No  FALLS:  Has patient fallen in last 6 months? Yes. Number of falls 1  LIVING ENVIRONMENT: Lives with: lives with their family, lives with their spouse,  and lives with their partner Lives in: House/apartment Stairs: Yes: Internal: flight steps; can reach both Has following equipment at home: None  OCCUPATION: retired  PLOF: Independent with basic ADLs  PATIENT GOALS to have less pain and be able to walk again    OBJECTIVE: all objective information taken at eval unless otherwise indicated  DIAGNOSTIC FINDINGS:  MRI 08/27/21: L3-4: Mild chronic degenerative changes without visible neural compression or progression.   L4-5: Disc bulge. Facet degeneration and hypertrophy. Stenosis of the lateral recesses and foramina could possibly cause neural compression. Foraminal stenosis slightly worse on the right. Slight worsening since 2019. Small synovial cyst previously seen is no longer visible.   L5-S1: Disc and facet degeneration. Bilateral foraminal stenosis could compress either or both L5 nerves. Similar appearance to the prior study.    LUMBAR ROM:   Active  A/PROM  eval  Flexion 41  Extension -4 from neutral*   Right lateral flexion 10 *  Left lateral flexion 10  Right rotation 6  Left rotation 8   (Blank rows = not tested) *pain    LOWER EXTREMITY MMT:    MMT Right eval Left eval  Hip flexion 3+ 3+  Hip extension 2 2  Hip abduction 3- 3-  Hip adduction 3- 3-  Hip internal rotation 2+ 2+  Hip external rotation 2+ 2+  Knee flexion 2+ 2+  Knee extension 3+ 3+  Ankle dorsiflexion 3 3  Ankle plantarflexion 3 3    TODAY'S TREATMENT   Manual: (Moist heat applied to low back during manual therapy)  Hamstring stretch 30 seconds each LE- x 4 each Single knee to chest 30 seconds x 4 sets each LE             Sciatic nerve glide with leg on PT arm 20x each LE Figure four stretch 30 seconds each LE x 4 sets (Patient reports "Good pain"   Massage Roller to bilateral IT band, Hamstring, calf, Piriformis and low back  x 15 minutes  Long axis distraction x 60 sec x 2 each LE *Patient reported pain down to 5/10 after session    PATIENT EDUCATION:  Education details: Pt educated throughout session about proper posture and technique with exercises. Improved exercise technique, movement at target joints, use of target muscles after min to mod verbal, visual, tactile cues.  Person educated: Patient Education method: Explanation, Demonstration, Tactile cues, and Verbal cues Education comprehension: verbalized understanding, returned demonstration, verbal cues required, tactile cues required, and needs further education   HOME EXERCISE PROGRAM:   No updates 11/14/2021 Access Code: [email or web address] URL: [email or web address] Date: 10/24/2021 Prepared by: Janna Arch  Exercises - Supine Lower Trunk Rotation  - 1 x daily - 7 x weekly - 2 sets - 10 reps - 5 hold - Supine Posterior Pelvic Tilt  - 1 x daily - 7 x weekly - 2 sets - 10 reps - 5 hold - Supine Figure 4 Piriformis Stretch  - 1 x daily - 7 x weekly - 2 sets - 10 reps - 5 hold - Seated Hamstring Stretch  - 1 x daily - 7 x weekly - 2 sets - 2 reps - 30 hold  ASSESSMENT:  CLINICAL IMPRESSION: Treatment continued to focus on pain control with manual stretching due to patient report of overall increased low back/gluteal pain. He presents with increased tightness/soreness in bilateral gluteal areas- Right worse than left yet responded well to all manual techniques with improved report of pain.  Patient to see  Neurosurgeon for further evaluation next visit.   The patient will benefit from skilled physical therapy to decrease pain, improve mobility, and increase strength for improved quality of life.    OBJECTIVE IMPAIRMENTS Abnormal gait, cardiopulmonary status limiting activity, decreased activity tolerance, decreased balance, decreased coordination, decreased  endurance, decreased mobility, difficulty walking, decreased ROM, decreased strength, hypomobility, impaired perceived functional ability, increased muscle spasms, impaired flexibility, improper body mechanics, postural dysfunction, and pain.   ACTIVITY LIMITATIONS carrying, lifting, bending, sitting, standing, squatting, sleeping, stairs, transfers, bed mobility, bathing, toileting, dressing, reach over head, hygiene/grooming, and locomotion level  PARTICIPATION LIMITATIONS: meal prep, cleaning, laundry, driving, shopping, community activity, and yard work  PERSONAL FACTORS Age, Fitness, Past/current experiences, Time since onset of injury/illness/exacerbation, Transportation, and 3+ comorbidities: arthritis, a fib, CHF, COPD, DM, neuropathy, heart murmur, HTN, PE, shingles, spinal stenosis, SI pain, bilateral OA of knees, lumbar facet arthropathy, lumbar radiculopathy, chronic pain syndrome, lung nodule, exercise hypoxemia, glaucoma of bilateral eyes, flat feet, onychomycosis, PAF  are also affecting patient's functional outcome.   REHAB POTENTIAL: Fair    CLINICAL DECISION MAKING: Evolving/moderate complexity  EVALUATION COMPLEXITY: Moderate   GOALS: Goals reviewed with patient? Yes  SHORT TERM GOALS: Target date: 11/21/2021  Patient will be independent in home exercise program to improve strength/mobility for better functional independence with ADLs. Baseline: 6/26: Hep given; 01/03/2022=  Goal status: GOAL MET  2.  Patient will report a worst pain of 5/10 on VAS in   back and LE's to improve tolerance with ADLs and reduced symptoms with activities.  Baseline: 6/26: 10/10; 01/03/2022= Pain is worse since fall this past weekend= 7/10 Goal status: GOAL MET LONG TERM GOALS: Target date: 12/19/2021  Patient will increase FOTO score to equal to or greater than  53   to demonstrate statistically significant improvement in mobility and quality of life.  Baseline: 6/26: 41% Goal status:  INITIAL  2.  Patient will report a worst pain of 3/10 on VAS in  low back and LEs  to improve tolerance with ADLs and reduced symptoms with activities.  Baseline:6/26: 10/10;  01/03/2022= Pain is worse since fall this past weekend= 7/10 Goal status: ONGOING  3.  Patient (> 54 years old) will complete five times sit to stand test in < 15 seconds indicating an increased LE strength and improved balance. Baseline: 6/26: 21.56 seconds with heavy BUE support Goal status: INITIAL  4.   Patient will reduce modified Oswestry score to <20 as to demonstrate minimal disability with ADLs including improved sleeping tolerance, walking/sitting tolerance etc for better mobility with ADLs Baseline: 6/26: 60%  Goal status: INITIAL  5.  Patient will increase 10 meter walk test to >1.55ms as to improve gait speed for better community ambulation and to reduce fall risk. Baseline: 6/26: unable to ambulate distance Goal status: INITIAL    PLAN: PT FREQUENCY: 2x/week  PT DURATION: 8 weeks  PLANNED INTERVENTIONS: Therapeutic exercises, Therapeutic activity, Neuromuscular re-education, Balance training, Gait training, Patient/Family education, Joint manipulation, Joint mobilization, Stair training, DME instructions, Dry Needling, Spinal mobilization, Cryotherapy, Moist heat, Taping, Traction, Ultrasound, Ionotophoresis 447mml Dexamethasone, and Manual therapy.  PLAN FOR NEXT SESSION: core stabilization, mobilization, LE lengthening.     01/04/2022, 2:31 PM

## 2022-01-04 ENCOUNTER — Ambulatory Visit: Payer: 59 | Attending: Physical Medicine & Rehabilitation

## 2022-01-04 DIAGNOSIS — R269 Unspecified abnormalities of gait and mobility: Secondary | ICD-10-CM | POA: Diagnosis present

## 2022-01-04 DIAGNOSIS — M5459 Other low back pain: Secondary | ICD-10-CM | POA: Insufficient documentation

## 2022-01-04 DIAGNOSIS — M6281 Muscle weakness (generalized): Secondary | ICD-10-CM | POA: Diagnosis present

## 2022-01-04 DIAGNOSIS — R262 Difficulty in walking, not elsewhere classified: Secondary | ICD-10-CM | POA: Diagnosis present

## 2022-01-04 NOTE — Progress Notes (Signed)
Referring Physician:  No referring provider defined for this encounter.  Primary Physician:  Maree Erie, MD  History of Present Illness: 01/04/2022 [name] is here today with a chief complaint of  low back pain that radiates into the bilateral legs and down into the feet. The left side is worse.  He has been having trouble for several years.  He has pain worse in the left than the right.  Standing, walking, bending, twisting make his pain worse.  His pain is down the back of his legs to his posterior and anterolateral calf on both sides.  Nothing really helps.  He has tried pain medications but it does not help.  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    Bowel/Bladder Dysfunction: none  Conservative measures:  Physical therapy: currently participating in at [facility]. He started this on 10/24/21. Multimodal medical therapy including regular antiinflammatories: celebrex, gabapentin, tizanidine. Injections:  has had epidural steroid injections 12/21/21: Right L4-5 IL ESI (Dr. Cherylann Ratel) 10/26/21: Right L4-5 IL ESI (Dr. Cherylann Ratel) 09/12/21: Bilateral L5-S1 TF ESI (Dr. Mariah Milling)   Past Surgery: denies  Mario [name] has no symptoms of cervical myelopathy.  The symptoms are causing a significant impact on the patient's life.   Review of Systems:  A 10 point review of systems is negative, except for the pertinent positives and negatives detailed in the HPI.  Past Medical History: Past Medical History:  Diagnosis Date   Arthritis    hands, knees   Atrial fibrillation (HCC)    CHF (congestive heart failure) (HCC)    COPD (chronic obstructive pulmonary disease) (HCC)    Diabetes mellitus without complication (HCC)    Diabetic neuropathy (HCC)    Heart murmur    in past, mild 1969   Hypertension    resolved after weight loss and more activity   Neuromuscular disorder (HCC)    neuropathy   Neuropathy    Pulmonary  embolism (HCC)    approx 2014   Shingles '99   Sleep apnea    CPAP   Wears dentures    full upper and lower    Past Surgical History: Past Surgical History:  Procedure Laterality Date   CATARACT EXTRACTION W/PHACO Left 05/12/2019   Procedure: CATARACT EXTRACTION PHACO AND INTRAOCULAR LENS PLACEMENT ([facility]) LEFTDIABETIC ISTENT INJ 2.43  00:28.6;  Surgeon: Nevada Crane, MD;  Location: [facility];  Service: Ophthalmology;  Laterality: Left;  Diabetic - oral meds sleep apnea   CATARACT EXTRACTION W/PHACO Right 06/02/2019   Procedure: CATARACT EXTRACTION PHACO AND INTRAOCULAR LENS PLACEMENT ([facility]) ISTENT INJ  RIGHT DIABETIC 1.31  00:25.4;  Surgeon: Nevada Crane, MD;  Location: [facility];  Service: Ophthalmology;  Laterality: Right;   COLONOSCOPY  08/30/2015   FINGER SURGERY Right    3'rd finger, right hand   KNEE SURGERY Right    screw in knee   WRIST SURGERY Left    metal removed    Allergies: Allergies as of 01/05/2022 - Review Complete 01/04/2022  Allergen Reaction Noted   Rosuvastatin Anaphylaxis 05/06/2019   Ace inhibitors Anxiety 02/15/2016   Lisinopril  05/22/2019   Simbrinza [brinzolamide-brimonidine] Itching 05/06/2019    Medications: No outpatient medications have been marked as taking for the 01/05/22 encounter (Appointment) with Venetia Night, MD.    Social History: Social History   Tobacco Use   Smoking status: Former    Packs/day: 0.50    Years:  1.00    Total pack years: 0.50    Types: Cigarettes    Quit date: 05/01/1969    Years since quitting: 52.7   Smokeless tobacco: Never   Tobacco comments:    Quit 1971  Vaping Use   Vaping Use: Never used  Substance Use Topics   Alcohol use: No   Drug use: No    Family Medical History: No family history on file.  Physical Examination: There were no vitals filed for this visit.  General: Patient is well developed, well nourished, calm, collected, and in no apparent distress.  Attention to examination is appropriate.  Neck:   Supple.  Full range of motion.  Respiratory: Patient is breathing without any difficulty.   NEUROLOGICAL:     Awake, alert, oriented to person, place, and time.  Speech is clear and fluent. Fund of knowledge is appropriate.   Cranial Nerves: Pupils equal round and reactive to light.  Facial tone is symmetric.  Facial sensation is symmetric. Shoulder shrug is symmetric. Tongue protrusion is midline.  There is no pronator drift.  ROM of spine: full.    Strength: Side Biceps Triceps Deltoid Interossei Grip Wrist Ext. Wrist Flex.  R 5 5 5 5 5 5 5   L 5 5 5 5 5 5 5    Side Iliopsoas Quads Hamstring PF DF EHL  R 5 5 5 5 5 5   L 5 5 5 5 5 5    Reflexes are 2+ and symmetric at the biceps, triceps, brachioradialis, patella and achilles.   Hoffman's is absent.  Clonus is not present.  Toes are down-going.  Bilateral upper and lower extremity sensation is intact to light touch.    No evidence of dysmetria noted.  Gait is normal.    Medical Decision Making  Imaging: MRI L spine 08/27/21 IMPRESSION: L3-4: Mild chronic degenerative changes without visible neural compression or progression.   L4-5: Disc bulge. Facet degeneration and hypertrophy. Stenosis of the lateral recesses and foramina could possibly cause neural compression. Foraminal stenosis slightly worse on the right. Slight worsening since 2019. Small synovial cyst previously seen is no longer visible.   L5-S1: Disc and facet degeneration. Bilateral foraminal stenosis could compress either or both L5 nerves. Similar appearance to the prior study.     Electronically Signed   By: M.D.   On: 08/29/2021 10:21  I have personally reviewed the images and agree with the above interpretation.  Assessment and Plan: Mario [name] is a pleasant 76 y.o. 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  I do not think he  could achieve adequate decompression of the L5 nerve roots with decompression alone.  I think he would require an L5-S1 transforaminal lumbar interbody fusion.  He has tried and failed physical therapy.  He has tried injections.  He is seeing his pain management doctor on Monday to discuss any further alternatives.  I did discuss with him and his wife that he is at extremely high risk of medical complication after surgery.  If he has no other options, we will discuss with his pulmonologist about whether he is a suitable candidate to consider a significant surgical intervention.   I spent a total of 30 minutes in face-to-face and non-face-to-face activities related to this patient's care today.  Thank you for involving me in the care of this patient.      Mario [name] K. 10/29/2021 MD, [facility] '

## 2022-01-05 ENCOUNTER — Ambulatory Visit (INDEPENDENT_AMBULATORY_CARE_PROVIDER_SITE_OTHER): Payer: 59 | Admitting: Neurosurgery

## 2022-01-05 ENCOUNTER — Encounter: Payer: Self-pay | Admitting: Neurosurgery

## 2022-01-05 VITALS — BP 128/82 | Ht 70.0 in | Wt 230.0 lb

## 2022-01-05 DIAGNOSIS — M5442 Lumbago with sciatica, left side: Secondary | ICD-10-CM

## 2022-01-05 DIAGNOSIS — M5441 Lumbago with sciatica, right side: Secondary | ICD-10-CM | POA: Diagnosis not present

## 2022-01-05 DIAGNOSIS — G8929 Other chronic pain: Secondary | ICD-10-CM | POA: Diagnosis not present

## 2022-01-05 DIAGNOSIS — M48061 Spinal stenosis, lumbar region without neurogenic claudication: Secondary | ICD-10-CM | POA: Diagnosis not present

## 2022-01-06 ENCOUNTER — Ambulatory Visit: Admission: RE | Admit: 2022-01-06 | Payer: 59 | Source: Home / Self Care

## 2022-01-06 ENCOUNTER — Encounter: Admission: RE | Payer: Self-pay | Source: Home / Self Care

## 2022-01-06 SURGERY — COLONOSCOPY WITH PROPOFOL
Anesthesia: General

## 2022-01-06 NOTE — Unmapped (Signed)
[facility] Refill Coordination Note    Specialty Medication(s) to be Shipped:   Inflammatory Disorders: Skyrizi    Other medication(s) to be shipped: No additional medications requested for fill at this time     Paul Velez, DOB: [birth year]  Phone: [phone] (home)       All above HIPAA information was verified with patient.     Was a Nurse, learning disability used for this call? No    Completed refill call assessment today to schedule patient's medication shipment from the [facility] [phone]).  All relevant notes have been reviewed.     Specialty medication(s) and dose(s) confirmed: Regimen is correct and unchanged.   Changes to medications: Paul Velez reports no changes at this time.  Changes to insurance: No  New side effects reported not previously addressed with a pharmacist or physician: None reported  Questions for the pharmacist: No    Confirmed patient received a Conservation officer, historic buildings and a Surveyor, mining with first shipment. The patient will receive a drug information handout for each medication shipped and additional FDA Medication Guides as required.       DISEASE/MEDICATION-SPECIFIC INFORMATION        For patients on injectable medications: Patient currently has 0 doses left.  Next injection is scheduled for 01/18/2022.    SPECIALTY MEDICATION ADHERENCE     Medication Adherence    Patient reported X missed doses in the last month: 0  Specialty Medication: Skyrizi  Patient is on additional specialty medications: No  Any gaps in refill history greater than 2 weeks in the last 3 months: no  Demonstrates understanding of importance of adherence: yes  Informant: patient  Reliability of informant: reliable              Confirmed plan for next specialty medication refill: delivery by pharmacy  Refills needed for supportive medications: not needed              Were doses missed due to medication being on hold? No    Skyrizi 150 mg/ml: 0 days of medicine on hand       REFERRAL TO PHARMACIST     Referral to the pharmacist: Not needed      [facility]     Shipping address confirmed in Epic.     Delivery Scheduled: Yes, Expected medication delivery date: 01/11/2022.     Medication will be delivered via Same Day Courier to the prescription address in Epic WAM.    Paul Velez   [facility] Specialty Technician

## 2022-01-09 ENCOUNTER — Encounter: Payer: Self-pay | Admitting: Student in an Organized Health Care Education/Training Program

## 2022-01-09 ENCOUNTER — Ambulatory Visit
Payer: 59 | Attending: Student in an Organized Health Care Education/Training Program | Admitting: Student in an Organized Health Care Education/Training Program

## 2022-01-09 ENCOUNTER — Ambulatory Visit: Payer: 59

## 2022-01-09 ENCOUNTER — Ambulatory Visit
Admission: RE | Admit: 2022-01-09 | Discharge: 2022-01-09 | Disposition: A | Discharge status: Home / Self Care | Payer: 59 | Source: Ambulatory Visit | Attending: Student in an Organized Health Care Education/Training Program | Admitting: Student in an Organized Health Care Education/Training Program

## 2022-01-09 VITALS — BP 115/78 | HR 89 | Temp 98.1°F | Resp 18 | Ht 70.0 in | Wt 230.0 lb

## 2022-01-09 DIAGNOSIS — M47816 Spondylosis without myelopathy or radiculopathy, lumbar region: Secondary | ICD-10-CM | POA: Diagnosis present

## 2022-01-09 DIAGNOSIS — M170 Bilateral primary osteoarthritis of knee: Secondary | ICD-10-CM | POA: Diagnosis present

## 2022-01-09 DIAGNOSIS — G894 Chronic pain syndrome: Secondary | ICD-10-CM | POA: Diagnosis present

## 2022-01-09 MED ORDER — DEXAMETHASONE SODIUM PHOSPHATE 10 MG/ML IJ SOLN
10.0000 mg | Freq: Once | INTRAMUSCULAR | Status: AC
  Administered 2022-01-09: 10 mg

## 2022-01-09 MED ORDER — LIDOCAINE HCL 2 % IJ SOLN
20.0000 mL | Freq: Once | INTRAMUSCULAR | Status: AC
  Administered 2022-01-09: 400 mg

## 2022-01-09 MED ORDER — ROPIVACAINE HCL 2 MG/ML IJ SOLN
INTRAMUSCULAR | Status: AC
  Filled 2022-01-09: qty 20

## 2022-01-09 MED ORDER — DEXAMETHASONE SODIUM PHOSPHATE 10 MG/ML IJ SOLN
INTRAMUSCULAR | Status: AC
  Filled 2022-01-09: qty 2

## 2022-01-09 MED ORDER — ROPIVACAINE HCL 2 MG/ML IJ SOLN
9.0000 mL | Freq: Once | INTRAMUSCULAR | Status: AC
  Administered 2022-01-09: 9 mL via PERINEURAL

## 2022-01-09 MED ORDER — DIAZEPAM 5 MG PO TABS
ORAL_TABLET | ORAL | Status: AC
  Filled 2022-01-09: qty 1

## 2022-01-09 MED ORDER — LIDOCAINE HCL 2 % IJ SOLN
INTRAMUSCULAR | Status: AC
  Filled 2022-01-09: qty 20

## 2022-01-09 MED ORDER — DIAZEPAM 5 MG PO TABS
5.0000 mg | ORAL_TABLET | ORAL | Status: AC
  Administered 2022-01-09: 5 mg via ORAL

## 2022-01-09 NOTE — Patient Instructions (Signed)
Pain Management Discharge Instructions  General Discharge Instructions :  If you need to reach your doctor call: Monday-Friday 8:00 am - 4:00 pm at [phone] or toll free [phone].  After clinic hours [phone] to have operator reach doctor.  Bring all of your medication bottles to all your appointments in the pain clinic.  To cancel or reschedule your appointment with Pain Management please remember to call 24 hours in advance to avoid a fee.  Refer to the educational materials which you have been given on: General Risks, I had my Procedure. Discharge Instructions, Post Sedation.  Post Procedure Instructions:  The drugs you were given will stay in your system until tomorrow, so for the next 24 hours you should not drive, make any legal decisions or drink any alcoholic beverages.  You may eat anything you prefer, but it is better to start with liquids then soups and crackers, and gradually work up to solid foods.  Please notify your doctor immediately if you have any unusual bleeding, trouble breathing or pain that is not related to your normal pain.  Depending on the type of procedure that was done, some parts of your body may feel week and/or numb.  This usually clears up by tonight or the next day.  Walk with the use of an assistive device or accompanied by an adult for the 24 hours.  You may use ice on the affected area for the first 24 hours.  Put ice in a Ziploc bag and cover with a towel and place against area 15 minutes on 15 minutes off.  You may switch to heat after 24 hours.Facet Blocks Patient Information  Description: The facets are joints in the spine between the vertebrae.  Like any joints in the body, facets can become irritated and painful.  Arthritis can also effect the facets.  By injecting steroids and local anesthetic in and around these joints, we can temporarily block the nerve supply to them.  Steroids act directly on irritated nerves and tissues to  reduce selling and inflammation which often leads to decreased pain.  Facet blocks may be done anywhere along the spine from the neck to the low back depending upon the location of your pain.   After numbing the skin with local anesthetic (like Novocaine), a small needle is passed onto the facet joints under x-ray guidance.  You may experience a sensation of pressure while this is being done.  The entire block usually lasts about 15-25 minutes.   Conditions which may be treated by facet blocks:  Low back/buttock pain Neck/shoulder pain Certain types of headaches  Preparation for the injection:  Do not eat any solid food or dairy products within 8 hours of your appointment. You may drink clear liquid up to 3 hours before appointment.  Clear liquids include water, black coffee, juice or soda.  No milk or cream please. You may take your regular medication, including pain medications, with a sip of water before your appointment.  Diabetics should hold regular insulin (if taken separately) and take 1/2 normal NPH dose the morning of the procedure.  Carry some sugar containing items with you to your appointment. A driver must accompany you and be prepared to drive you home after your procedure. Bring all your current medications with you. An IV may be inserted and sedation may be given at the discretion of the physician. A blood pressure cuff, EKG and other monitors will often be applied during the procedure.  Some patients may need to   have extra oxygen administered for a short period. You will be asked to provide medical information, including your allergies and medications, prior to the procedure.  We must know immediately if you are taking blood thinners (like Coumadin/Warfarin) or if you are allergic to IV iodine contrast (dye).  We must know if you could possible be pregnant.  Possible side-effects:  Bleeding from needle site Infection (rare, may require surgery) Nerve injury (rare) Numbness  & tingling (temporary) Difficulty urinating (rare, temporary) Spinal headache (a headache worse with upright posture) Light-headedness (temporary) Pain at injection site (serveral days) Decreased blood pressure (rare, temporary) Weakness in arm/leg (temporary) Pressure sensation in back/neck (temporary)   Call if you experience:  Fever/chills associated with headache or increased back/neck pain Headache worsened by an upright position New onset, weakness or numbness of an extremity below the injection site Hives or difficulty breathing (go to the emergency room) Inflammation or drainage at the injection site(s) Severe back/neck pain greater than usual New symptoms which are concerning to you  Please note:  Although the local anesthetic injected can often make your back or neck feel good for several hours after the injection, the pain will likely return. It takes 3-7 days for steroids to work.  You may not notice any pain relief for at least one week.  If effective, we will often do a series of 2-3 injections spaced 3-6 weeks apart to maximally decrease your pain.  After the initial series, you may be a candidate for a more permanent nerve block of the facets.  If you have any questions, please call [phone] [facility] 

## 2022-01-09 NOTE — Progress Notes (Signed)
Safety precautions to be maintained throughout the outpatient stay will include: orient to surroundings, keep bed in low position, maintain call bell within reach at all times, provide assistance with transfer out of bed and ambulation.  

## 2022-01-09 NOTE — Progress Notes (Signed)
PROVIDER NOTE: Interpretation of information contained herein should be left to medically-trained personnel. Specific patient instructions are provided elsewhere under "Patient Instructions" section of medical record. This document was created in part using STT-dictation technology, any transcriptional errors that may result from this process are unintentional.  Patient: Mario [name] Type: Established DOB: 1945/05/28 MRN: [id number] PCP: Maree Erie, MD  Service: Procedure DOS: 01/09/2022 Setting: Ambulatory Location: Ambulatory outpatient facility Delivery: Face-to-face Provider: Edward Jolly, MD Specialty: Interventional Pain Management Specialty designation: 09 Location: Outpatient facility Ref. Prov.: Edward Jolly, MD    Procedure:           Type: Lumbar Facet, Medial Branch Block(s) #1  Laterality: Bilateral  Level: L3, L4, L5, Branch Level(s). Injecting these levels blocks the L3-4 L4-5 lumbar facet joints. Imaging: Fluoroscopic guidance         Anesthesia: Local anesthesia (1-2% Lidocaine) Sedation: Minimal sedation, 5 mg p.o. Valium DOS: 01/09/2022 Performed by: Edward Jolly, MD  Primary Purpose: Diagnostic/Therapeutic Indications: Low back pain severe enough to impact quality of life or function. 1. Lumbar facet arthropathy   2. Bilateral primary osteoarthritis of knee   3. Chronic pain syndrome    NAS-11 Pain score:   Pre-procedure: 4 /10   Post-procedure: 1 /10     Position / Prep / Materials:  Position: Prone  Prep solution: DuraPrep (Iodine Povacrylex [0.7% available iodine] and Isopropyl Alcohol, 74% w/w) Area Prepped: Posterolateral Lumbosacral Spine (Wide prep: From the lower border of the scapula down to the end of the tailbone and from flank to flank.)  Materials:  Tray: Block Needle(s):  Type: Spinal  Gauge (G): 22  Length: 5-in Qty: 2     Pre-op H&P Assessment:  Mr. Salamone is a 76 y.o. (year old), male patient, seen today for interventional  treatment. He  has a past surgical history that includes Wrist surgery (Left); Knee surgery (Right); Colonoscopy (08/30/2015); Cataract extraction w/PHACO (Left, 05/12/2019); Finger surgery (Right); and Cataract extraction w/PHACO (Right, 06/02/2019). Mr. Wattenbarger has a current medication list which includes the following prescription(s): albuterol, apple cider vinegar, atorvastatin, celecoxib, duloxetine, edoxaban, empagliflozin, fluticasone-umeclidin-vilant, furosemide, gabapentin, glucose blood, ipratropium-albuterol, losartan, metformin, mirtazapine, ozempic (1 mg/dose), skyrizi, rocklatan, sotalol af, tizanidine, triamcinolone cream, turmeric, and hydrochlorothiazide. His primarily concern today is the Back Pain (lower)  Initial Vital Signs:  Pulse/HCG Rate: 89ECG Heart Rate: 91 Temp: 98.1 F (36.7 C) Resp: 16 BP: 110/79 SpO2: 91 % (pt on o2 2l)  BMI: Estimated body mass index is 33 kg/m as calculated from the following:   Height as of this encounter: 5\' 10"  (1.778 m).   Weight as of this encounter: 230 lb (104.3 kg).  Risk Assessment: Allergies: Reviewed. He is allergic to rosuvastatin, ace inhibitors, lisinopril, and simbrinza [brinzolamide-brimonidine].  Allergy Precautions: None required Coagulopathies: Reviewed. None identified.  Blood-thinner therapy: None at this time Active Infection(s): Reviewed. None identified. Mr. Bergerson is afebrile  Site Confirmation: Mr. Dugdale was asked to confirm the procedure and laterality before marking the site Procedure checklist: Completed Consent: Before the procedure and under the influence of no sedative(s), amnesic(s), or anxiolytics, the patient was informed of the treatment options, risks and possible complications. To fulfill our ethical and legal obligations, as recommended by the American Medical Association's Code of Ethics, I have informed the patient of my clinical impression; the nature and purpose of the treatment or procedure; the risks,  benefits, and possible complications of the intervention; the alternatives, including doing nothing; the risk(s) and benefit(s) of the alternative treatment(s)  or procedure(s); and the risk(s) and benefit(s) of doing nothing. The patient was provided information about the general risks and possible complications associated with the procedure. These may include, but are not limited to: failure to achieve desired goals, infection, bleeding, organ or nerve damage, allergic reactions, paralysis, and death. In addition, the patient was informed of those risks and complications associated to Spine-related procedures, such as failure to decrease pain; infection (i.e.: Meningitis, epidural or intraspinal abscess); bleeding (i.e.: epidural hematoma, subarachnoid hemorrhage, or any other type of intraspinal or peri-dural bleeding); organ or nerve damage (i.e.: Any type of peripheral nerve, nerve root, or spinal cord injury) with subsequent damage to sensory, motor, and/or autonomic systems, resulting in permanent pain, numbness, and/or weakness of one or several areas of the body; allergic reactions; (i.e.: anaphylactic reaction); and/or death. Furthermore, the patient was informed of those risks and complications associated with the medications. These include, but are not limited to: allergic reactions (i.e.: anaphylactic or anaphylactoid reaction(s)); adrenal axis suppression; blood sugar elevation that in diabetics may result in ketoacidosis or comma; water retention that in patients with history of congestive heart failure may result in shortness of breath, pulmonary edema, and decompensation with resultant heart failure; weight gain; swelling or edema; medication-induced neural toxicity; particulate matter embolism and blood vessel occlusion with resultant organ, and/or nervous system infarction; and/or aseptic necrosis of one or more joints. Finally, the patient was informed that Medicine is not an exact science;  therefore, there is also the possibility of unforeseen or unpredictable risks and/or possible complications that may result in a catastrophic outcome. The patient indicated having understood very clearly. We have given the patient no guarantees and we have made no promises. Enough time was given to the patient to ask questions, all of which were answered to the patient's satisfaction. Mr. Danae OrleansBush has indicated that he wanted to continue with the procedure. Attestation: I, the ordering provider, attest that I have discussed with the patient the benefits, risks, side-effects, alternatives, likelihood of achieving goals, and potential problems during recovery for the procedure that I have provided informed consent. Date  Time: 01/09/2022 10:36 AM  Pre-Procedure Preparation:  Monitoring: As per clinic protocol. Respiration, ETCO2, SpO2, BP, heart rate and rhythm monitor placed and checked for adequate function Safety Precautions: Patient was assessed for positional comfort and pressure points before starting the procedure. Time-out: I initiated and conducted the "Time-out" before starting the procedure, as per protocol. The patient was asked to participate by confirming the accuracy of the "Time Out" information. Verification of the correct person, site, and procedure were performed and confirmed by me, the nursing staff, and the patient. "Time-out" conducted as per Joint Commission's Universal Protocol (UP.01.01.01). Time: 1111  Description of Procedure:          Laterality: Bilateral. The procedure was performed in identical fashion on both sides. Targeted Levels: L3, L4, L5,Medial Branch Level(s)  Safety Precautions: Aspiration looking for blood return was conducted prior to all injections. At no point did we inject any substances, as a needle was being advanced. Before injecting, the patient was told to immediately notify me if he was experiencing any new onset of "ringing in the ears, or metallic taste  in the mouth". No attempts were made at seeking any paresthesias. Safe injection practices and needle disposal techniques used. Medications properly checked for expiration dates. SDV (single dose vial) medications used. After the completion of the procedure, all disposable equipment used was discarded in the proper designated medical waste containers.  Local Anesthesia: Protocol guidelines were followed. The patient was positioned over the fluoroscopy table. The area was prepped in the usual manner. The time-out was completed. The target area was identified using fluoroscopy. A 12-in long, straight, sterile hemostat was used with fluoroscopic guidance to locate the targets for each level blocked. Once located, the skin was marked with an approved surgical skin marker. Once all sites were marked, the skin (epidermis, dermis, and hypodermis), as well as deeper tissues (fat, connective tissue and muscle) were infiltrated with a small amount of a short-acting local anesthetic, loaded on a 10cc syringe with a 25G, 1.5-in  Needle. An appropriate amount of time was allowed for local anesthetics to take effect before proceeding to the next step. Local Anesthetic: Lidocaine 2.0% The unused portion of the local anesthetic was discarded in the proper designated containers. Technical description of process:  L3 Medial Branch Nerve Block (MBB): The target area for the L3 medial branch is at the junction of the postero-lateral aspect of the superior articular process and the superior, posterior, and medial edge of the transverse process of L4. Under fluoroscopic guidance, a Quincke needle was inserted until contact was made with os over the superior postero-lateral aspect of the pedicular shadow (target area). After negative aspiration for blood, 37mL of the nerve block solution was injected without difficulty or complication. The needle was removed intact. L4 Medial Branch Nerve Block (MBB): The target area for the L4 medial  branch is at the junction of the postero-lateral aspect of the superior articular process and the superior, posterior, and medial edge of the transverse process of L5. Under fluoroscopic guidance, a Quincke needle was inserted until contact was made with os over the superior postero-lateral aspect of the pedicular shadow (target area). After negative aspiration for blood, 63mL of the nerve block solution was injected without difficulty or complication. The needle was removed intact. L5 Medial Branch Nerve Block (MBB): The target area for the L5 medial branch is at the junction of the postero-lateral aspect of the superior articular process and the superior, posterior, and medial edge of the sacral ala. Under fluoroscopic guidance, a Quincke needle was inserted until contact was made with os over the superior postero-lateral aspect of the pedicular shadow (target area). After negative aspiration for blood, 83mL of the nerve block solution was injected without difficulty or complication. The needle was removed intact.   12 cc solution made of 10 cc of 0.2% ropivacaine, 2 cc of Decadron 10 mg/cc.  2 cc injected at each level above bilaterally.    Once the entire procedure was completed, the treated area was cleaned, making sure to leave some of the prepping solution back to take advantage of its long term bactericidal properties.         Illustration of the posterior view of the lumbar spine and the posterior neural structures. Laminae of L2 through S1 are labeled. DPRL5, dorsal primary ramus of L5; DPRS1, dorsal primary ramus of S1; DPR3, dorsal primary ramus of L3; FJ, facet (zygapophyseal) joint L3-L4; I, inferior articular process of L4; LB1, lateral branch of dorsal primary ramus of L1; IAB, inferior articular branches from L3 medial branch (supplies L4-L5 facet joint); IBP, intermediate branch plexus; MB3, medial branch of dorsal primary ramus of L3; NR3, third lumbar nerve root; S, superior articular  process of L5; SAB, superior articular branches from L4 (supplies L4-5 facet joint also); TP3, transverse process of L3.  Vitals:   01/09/22 1109 01/09/22 1114 01/09/22 1119 01/09/22  1122  BP: 120/78 120/78 106/65 115/78  Pulse:      Resp: 18 16 19 18   Temp:      SpO2: 95% 93% 95% 95%  Weight:      Height:         Start Time: 1111 hrs. End Time: 1121 hrs.  Imaging Guidance (Spinal):          Type of Imaging Technique: Fluoroscopy Guidance (Spinal) Indication(s): Assistance in needle guidance and placement for procedures requiring needle placement in or near specific anatomical locations not easily accessible without such assistance. Exposure Time: Please see nurses notes. Contrast: None used. Fluoroscopic Guidance: I was personally present during the use of fluoroscopy. "Tunnel Vision Technique" used to obtain the best possible view of the target area. Parallax error corrected before commencing the procedure. "Direction-depth-direction" technique used to introduce the needle under continuous pulsed fluoroscopy. Once target was reached, antero-posterior, oblique, and lateral fluoroscopic projection used confirm needle placement in all planes. Images permanently stored in EMR. Interpretation: No contrast injected. I personally interpreted the imaging intraoperatively. Adequate needle placement confirmed in multiple planes. Permanent images saved into the patient's record.  Post-operative Assessment:  Post-procedure Vital Signs:  Pulse/HCG Rate: 8989 Temp: 98.1 F (36.7 C) Resp: 18 BP: 115/78 SpO2: 95 %  EBL: None  Complications: No immediate post-treatment complications observed by team, or reported by patient.  Note: The patient tolerated the entire procedure well. A repeat set of vitals were taken after the procedure and the patient was kept under observation following institutional policy, for this type of procedure. Post-procedural neurological assessment was performed, showing  return to baseline, prior to discharge. The patient was provided with post-procedure discharge instructions, including a section on how to identify potential problems. Should any problems arise concerning this procedure, the patient was given instructions to immediately contact , at any time, without hesitation. In any case, we plan to contact the patient by telephone for a follow-up status report regarding this interventional procedure.  Comments:  No additional relevant information.  Plan of Care   Mr. Cartmell is also complaining of severe bilateral knee pain related to knee osteoarthritis.  He is status post bilateral genicular nerve block on 06/25/2019 that provided him 75% pain relief for over a year.  He is now endorsing increase in his bilateral knee pain and would like to repeat genicular nerve block.  If he does have good pain relief, we will discuss subsequent genicular nerve RFA.  Patient endorsed understanding.  We also reviewed Dr. 06/27/2019 previous clinic note and visit.  Patient understands that he has high risk for lumbar spine surgery in the context of his COPD.  He states that he would like to explore further interventional therapies before discussing surgery any further.   Orders:  Orders Placed This Encounter  Procedures   GENICULAR NERVE BLOCK    Indication(s):  Sub-acute knee pain    Standing Status:   Future    Standing Expiration Date:   04/10/2022    Scheduling Instructions:     Side: Bilateral     Sedation: without     Timeframe: As soon as schedule allows    Order Specific Question:   Where will this procedure be performed?    Answer:   [facility]   DG [facility] C-ARM 1-60 MIN NO REPORT    Intraoperative interpretation by procedural physician at [facility].    Standing Status:   Standing    Number of Occurrences:  1    Order Specific Question:   Reason for exam:    Answer:   Assistance in needle guidance and placement for procedures  requiring needle placement in or near specific anatomical locations not easily accessible without such assistance.    Medications ordered for procedure: Meds ordered this encounter  Medications   lidocaine (XYLOCAINE) 2 % (with pres) injection 400 mg   diazepam (VALIUM) tablet 5 mg    Make sure Flumazenil is available in the pyxis when using this medication. If oversedation occurs, administer 0.2 mg IV over 15 sec. If after 45 sec no response, administer 0.2 mg again over 1 min; may repeat at 1 min intervals; not to exceed 4 doses (1 mg)   dexamethasone (DECADRON) injection 10 mg   dexamethasone (DECADRON) injection 10 mg   ropivacaine (PF) 2 mg/mL (0.2%) (NAROPIN) injection 9 mL   ropivacaine (PF) 2 mg/mL (0.2%) (NAROPIN) injection 9 mL   Medications administered: We administered lidocaine, diazepam, dexamethasone, dexamethasone, ropivacaine (PF) 2 mg/mL (0.2%), and ropivacaine (PF) 2 mg/mL (0.2%).  See the medical record for exact dosing, route, and time of administration.  Follow-up plan:   Return in about 2 weeks (around 01/23/2022) for B/L GNB , in clinic NS.      Bilateral genicular nerve block, 06/25/2019, R L4/5 ESI 10/26/21, 12/21/21, bilateral L3, L4, L5 medial branch nerve block 01/09/2022    Recent Visits Date Type Provider Dept  12/29/21 Office Visit Edward Jolly, MD [facility]  12/21/21 Procedure visit Edward Jolly, MD [facility]  12/08/21 Office Visit Edward Jolly, MD [facility]  11/21/21 Office Visit Edward Jolly, MD [facility]  11/17/21 Office Visit Edward Jolly, MD [facility]  10/26/21 Procedure visit Edward Jolly, MD [facility]  Showing recent visits within past 90 days and meeting all other requirements Today's Visits Date Type Provider Dept  01/09/22 Procedure visit Edward Jolly, MD [facility]  Showing today's visits and meeting all other requirements Future Appointments Date Type  Provider Dept  01/23/22 Appointment Edward Jolly, MD [facility]  Showing future appointments within next 90 days and meeting all other requirements  Disposition: Discharge home  Discharge (Date  Time): 01/09/2022; 1135 hrs.   Primary Care Physician: Maree Erie, MD Location: [facility] Note by: Edward Jolly, MD Date: 01/09/2022; Time: 11:58 AM  Disclaimer:  Medicine is not an exact science. The only guarantee in medicine is that nothing is guaranteed. It is important to note that the decision to proceed with this intervention was based on the information collected from the patient. The Data and conclusions were drawn from the patient's questionnaire, the interview, and the physical examination. Because the information was provided in large part by the patient, it cannot be guaranteed that it has not been purposely or unconsciously manipulated. Every effort has been made to obtain as much relevant data as possible for this evaluation. It is important to note that the conclusions that lead to this procedure are derived in large part from the available data. Always take into account that the treatment will also be dependent on availability of resources and existing treatment guidelines, considered by other Pain Management Practitioners as being common knowledge and practice, at the time of the intervention. For Medico-Legal purposes, it is also important to point out that variation in procedural techniques and pharmacological choices are the acceptable norm. The indications, contraindications, technique, and results of the above procedure should only  be interpreted and judged by a Board-Certified Interventional Pain Specialist with extensive familiarity and expertise in the same exact procedure and technique.

## 2022-01-10 ENCOUNTER — Telehealth: Payer: Self-pay | Admitting: *Deleted

## 2022-01-10 NOTE — Telephone Encounter (Signed)
Post procedure call;  voicemail left.  

## 2022-01-11 ENCOUNTER — Ambulatory Visit: Payer: 59

## 2022-01-11 DIAGNOSIS — R262 Difficulty in walking, not elsewhere classified: Secondary | ICD-10-CM

## 2022-01-11 DIAGNOSIS — M5459 Other low back pain: Secondary | ICD-10-CM

## 2022-01-11 DIAGNOSIS — M6281 Muscle weakness (generalized): Secondary | ICD-10-CM

## 2022-01-11 DIAGNOSIS — R269 Unspecified abnormalities of gait and mobility: Secondary | ICD-10-CM

## 2022-01-11 MED FILL — SKYRIZI 150 MG/ML SUBCUTANEOUS PEN INJECTOR: SUBCUTANEOUS | 84 days supply | Qty: 1 | Fill #0

## 2022-01-11 NOTE — Therapy (Signed)
OUTPATIENT PHYSICAL THERAPY TREATMENT NOTE       Patient Name: Mario [name] MRN: [id number] DOB:[birth year], 76 y.o., male Today's Date: 01/11/2022   PT End of Session - 01/11/22 0804     Visit Number 11    Number of Visits 16    Date for PT Re-Evaluation 03/13/22    Authorization Type 8/10 eval 6/26    PT Start Time 0800    PT Stop Time 0843    PT Time Calculation (min) 43 min    Equipment Utilized During Treatment Gait belt    Activity Tolerance Patient tolerated treatment well    Behavior During Therapy WFL for tasks assessed/performed                   Past Medical History:  Diagnosis Date   Arthritis    hands, knees   Atrial fibrillation (HCC)    CHF (congestive heart failure) (HCC)    COPD (chronic obstructive pulmonary disease) (HCC)    Diabetes mellitus without complication ([place])    Diabetic neuropathy ([place])    Heart murmur    in past, mild 1969   Hypertension    resolved after weight loss and more activity   Neuromuscular disorder ([place])    neuropathy   Neuropathy    Pulmonary embolism ([place])    approx 2014   Shingles '99   Sleep apnea    CPAP   Wears dentures    full upper and lower   Past Surgical History:  Procedure Laterality Date   CATARACT EXTRACTION W/PHACO Left 05/12/2019   Procedure: CATARACT EXTRACTION PHACO AND INTRAOCULAR LENS PLACEMENT ([place]) LEFTDIABETIC ISTENT INJ 2.43  00:28.6;  Surgeon: Eulogio Bear, MD;  Location: [facility];  Service: Ophthalmology;  Laterality: Left;  Diabetic - oral meds sleep apnea   CATARACT EXTRACTION W/PHACO Right 06/02/2019   Procedure: CATARACT EXTRACTION PHACO AND INTRAOCULAR LENS PLACEMENT ([facility]) ISTENT INJ  RIGHT DIABETIC 1.31  00:25.4;  Surgeon: Eulogio Bear, MD;  Location: [facility];  Service: Ophthalmology;  Laterality: Right;   COLONOSCOPY  08/30/2015   FINGER SURGERY Right    3'rd finger, right hand   KNEE SURGERY Right    screw in knee   WRIST SURGERY Left     metal removed   Patient Active Problem List   Diagnosis Date Noted   Spinal stenosis, lumbar region, with neurogenic claudication 10/06/2021   Sacroiliac joint pain 10/06/2021   Piriformis muscle pain 10/06/2021   Bilateral primary osteoarthritis of knee 06/11/2019   Chronic radicular lumbar pain 06/11/2019   Cervical facet joint syndrome 06/11/2019   Lumbar facet arthropathy 06/11/2019   Lumbar radiculopathy 06/11/2019   Chronic pain syndrome 06/11/2019   Lung nodule 02/09/2016   Exercise hypoxemia 12/28/2015   Chronic obstructive pulmonary disease ([place]) 03/16/2015   Nuclear sclerosis 03/16/2015   OSA (obstructive sleep apnea) 03/16/2015   Primary open angle glaucoma of both eyes, severe stage 03/16/2015   Type 2 diabetes mellitus without complication, without long-term current use of insulin ([place]) 03/16/2015   Arthralgia of left lower leg 10/29/2014   Osteoarthritis of left knee 09/10/2014   Type 2 diabetes mellitus with diabetic neuropathy ([place]) 08/04/2014   Cataract, nuclear sclerotic senile 04/01/2014   Combined forms of age-related cataract 04/01/2014   Keratitis sicca, bilateral ([place]) 04/01/2014   Primary open angle glaucoma 02/27/2014   Flat feet 01/29/2014   Foot pain, bilateral 01/29/2014   Tinea pedis of both feet 01/29/2014   Ptosis of  both eyelids 11/25/2013   Type 2 diabetes mellitus without retinopathy ([place]) 11/25/2013   Onychomycosis 61/60/7371   Chronic systolic heart failure ([place]) 11/11/2013   Emphysema of lung ([place]) 11/11/2013   Essential hypertension 11/11/2013   Screen for colon cancer 11/05/2013   Cardiomyopathy ([place]) 10/25/9483   Systolic HF (heart failure) ([place]) 46/27/0350   Acute systolic CHF (congestive heart failure) ([place]) 09/16/2013   COPD (chronic obstructive pulmonary disease) ([place]) 09/16/2013   HTN (hypertension) 09/16/2013   Obesity 09/16/2013   PAF (paroxysmal atrial fibrillation) ([place]) 09/16/2013   Senile nuclear sclerosis 05/22/2013    Muscle weakness (generalized) 11/11/2009   Long term current use of anticoagulant therapy 06/17/2009   Pulmonary embolism ([place]) 06/17/2009   ED (erectile dysfunction) of organic origin 05/13/2003    PCP: Delma Freeze MD  REFERRING PROVIDER: Girtha Hake I MD   REFERRING DIAG: back and LE pain   Rationale for Evaluation and Treatment Rehabilitation  THERAPY DIAG:  Other low back pain  Muscle weakness (generalized)  Difficulty in walking, not elsewhere classified  Abnormality of gait and mobility  ONSET DATE: about 3 months ago.   SUBJECTIVE:                                                                                                                                                                                           SUBJECTIVE STATEMENT: Patient reports doing much better this week- States Dr. Holley Raring gave him another shot on Monday and states feeling good- denies pain today.    PERTINENT HISTORY:  Patient presents to physical therapy for low back and LE pain. PMH includes: arthritis, a fib, CHF, COPD, DM, neuropathy, heart murmur, HTN, PE, shingles, spinal stenosis, SI pain, bilateral OA of knees, lumbar facet arthropathy, lumbar radiculopathy, chronic pain syndrome, lung nodule, exercise hypoxemia, glaucoma of bilateral eyes, flat feet, onychomycosis, PAF. Per patient notes patient was recommended surgery by orthopedics but patient declined. Since patient was seen last in this clinic he has been in the ED three times. Patient reports his sciatic pain has been worsening in the past three months.   PAIN:  Are you having pain? No: NPRS scale: 0/10 Pain location: low back, buttock Pain description: radiating Aggravating factors: walking, moving Relieving factors: rest   PRECAUTIONS: Fall  WEIGHT BEARING RESTRICTIONS No  FALLS:  Has patient fallen in last 6 months? Yes. Number of falls 1  LIVING ENVIRONMENT: Lives with: lives with their family, lives with  their spouse, and lives with their partner Lives in: House/apartment Stairs: Yes: Internal: flight steps; can reach both Has following equipment at home: None  OCCUPATION: retired  PLOF:  Independent with basic ADLs  PATIENT GOALS to have less pain and be able to walk again    OBJECTIVE: all objective information taken at eval unless otherwise indicated  DIAGNOSTIC FINDINGS:  MRI 08/27/21: L3-4: Mild chronic degenerative changes without visible neural compression or progression.   L4-5: Disc bulge. Facet degeneration and hypertrophy. Stenosis of the lateral recesses and foramina could possibly cause neural compression. Foraminal stenosis slightly worse on the right. Slight worsening since 2019. Small synovial cyst previously seen is no longer visible.   L5-S1: Disc and facet degeneration. Bilateral foraminal stenosis could compress either or both L5 nerves. Similar appearance to the prior study.    LUMBAR ROM:   Active  A/PROM  eval  Flexion 41  Extension -4 from neutral*   Right lateral flexion 10 *  Left lateral flexion 10  Right rotation 6  Left rotation 8   (Blank rows = not tested) *pain    LOWER EXTREMITY MMT:    MMT Right eval Left eval  Hip flexion 3+ 3+  Hip extension 2 2  Hip abduction 3- 3-  Hip adduction 3- 3-  Hip internal rotation 2+ 2+  Hip external rotation 2+ 2+  Knee flexion 2+ 2+  Knee extension 3+ 3+  Ankle dorsiflexion 3 3  Ankle plantarflexion 3 3    TODAY'S TREATMENT    Reassessed several goals due to patient feeling better and able to do more this visit. See goal section for today's interventions.   Therex:   Seated Self stretching- Hamstring Alt LE x 30 sec x 4 sets Seated single knee to chest- hold 20 sec x 3 each LE Seated figure 4 stretch - hold 30 sec x 4 sets each LE    PATIENT EDUCATION:  Education details: Pt educated throughout session about proper posture and technique with exercises. Improved exercise  technique, movement at target joints, use of target muscles after min to mod verbal, visual, tactile cues.  Person educated: Patient Education method: Explanation, Demonstration, Tactile cues, and Verbal cues Education comprehension: verbalized understanding, returned demonstration, verbal cues required, tactile cues required, and needs further education   HOME EXERCISE PROGRAM:   No updates 11/14/2021 Access Code: [email or web address] URL: [email or web address] Date: 10/24/2021 Prepared by: Janna Arch  Exercises - Supine Lower Trunk Rotation  - 1 x daily - 7 x weekly - 2 sets - 10 reps - 5 hold - Supine Posterior Pelvic Tilt  - 1 x daily - 7 x weekly - 2 sets - 10 reps - 5 hold - Supine Figure 4 Piriformis Stretch  - 1 x daily - 7 x weekly - 2 sets - 10 reps - 5 hold - Seated Hamstring Stretch  - 1 x daily - 7 x weekly - 2 sets - 2 reps - 30 hold  ASSESSMENT:  CLINICAL IMPRESSION: Reassessed remaining goals that were not able to be tested last visit. Patient demonstrated progress with functional LE strength as seen by vastly improved 5 time sit to stand test. He also presents as painfree today (reports due to receiving an injection on Monday and feeling much better. He also demo progress with 10 meter walk test indicating decreased risk of falling. He also improved with his FOTO and Oswestry scores indicating a significant improvement in self perceived ability.  The patient will benefit from skilled physical therapy to decrease pain, improve mobility, and increase strength for improved quality of life.    OBJECTIVE IMPAIRMENTS Abnormal gait, cardiopulmonary status limiting activity, decreased activity tolerance, decreased  balance, decreased coordination, decreased endurance, decreased mobility, difficulty walking, decreased ROM, decreased strength, hypomobility, impaired perceived functional ability, increased muscle spasms, impaired flexibility, improper body mechanics, postural  dysfunction, and pain.   ACTIVITY LIMITATIONS carrying, lifting, bending, sitting, standing, squatting, sleeping, stairs, transfers, bed mobility, bathing, toileting, dressing, reach over head, hygiene/grooming, and locomotion level  PARTICIPATION LIMITATIONS: meal prep, cleaning, laundry, driving, shopping, community activity, and yard work  PERSONAL FACTORS Age, Fitness, Past/current experiences, Time since onset of injury/illness/exacerbation, Transportation, and 3+ comorbidities: arthritis, a fib, CHF, COPD, DM, neuropathy, heart murmur, HTN, PE, shingles, spinal stenosis, SI pain, bilateral OA of knees, lumbar facet arthropathy, lumbar radiculopathy, chronic pain syndrome, lung nodule, exercise hypoxemia, glaucoma of bilateral eyes, flat feet, onychomycosis, PAF  are also affecting patient's functional outcome.   REHAB POTENTIAL: Fair    CLINICAL DECISION MAKING: Evolving/moderate complexity  EVALUATION COMPLEXITY: Moderate   GOALS: Goals reviewed with patient? Yes  SHORT TERM GOALS: Target date: 11/21/2021  Patient will be independent in home exercise program to improve strength/mobility for better functional independence with ADLs. Baseline: 6/26: Hep given; 01/11/2022= patient reports doing well with stretches at home and no questions about technique  Goal status: GOAL MET  2.  Patient will report a worst pain of 5/10 on VAS in   back and LE's to improve tolerance with ADLs and reduced symptoms with activities.  Baseline: 6/26: 10/10; 01/03/2022= Pain is worse since fall this past weekend= 7/10; Goal status: GOAL MET LONG TERM GOALS: Target date: 12/19/2021  Patient will increase FOTO score to equal to or greater than  53   to demonstrate statistically significant improvement in mobility and quality of life.  Baseline: 6/26: 41%:  Goal status: INITIAL  2.  Patient will report a worst pain of 3/10 on VAS in  low back and LEs  to improve tolerance with ADLs and reduced symptoms with  activities.  Baseline:6/26: 10/10;  01/03/2022= Pain is worse since fall this past weekend= 7/10.  01/11/2022= Patient currently denies pain since injection on Monday Goal status: ONGOING  3.  Patient (> 68 years old) will complete five times sit to stand test in < 15 seconds indicating an increased LE strength and improved balance. Baseline: 6/26: 21.56 seconds with heavy BUE support; 01/11/2022= 12.58 sec without UE support.  Goal status: Will keep goal active to ensure patient able to perform consistently  4.   Patient will reduce modified Oswestry score to <20 as to demonstrate minimal disability with ADLs including improved sleeping tolerance, walking/sitting tolerance etc for better mobility with ADLs Baseline: 6/26: 60%;  Goal status:   5.  Patient will increase 10 meter walk test to >1.84ms as to improve gait speed for better community ambulation and to reduce fall risk. Baseline: 6/26: unable to ambulate distance; 01/11/2022= 0.72 m/s without an AD Goal status: ONGOING    PLAN: PT FREQUENCY: 2x/week  PT DURATION: 8 weeks  PLANNED INTERVENTIONS: Therapeutic exercises, Therapeutic activity, Neuromuscular re-education, Balance training, Gait training, Patient/Family education, Joint manipulation, Joint mobilization, Stair training, DME instructions, Dry Needling, Spinal mobilization, Cryotherapy, Moist heat, Taping, Traction, Ultrasound, Ionotophoresis 476mml Dexamethasone, and Manual therapy.  PLAN FOR NEXT SESSION: core stabilization, mobilization, LE lengthening.     01/11/2022, 9:17 PM

## 2022-01-11 NOTE — Telephone Encounter (Signed)
Informed pt that paperwork has not come back yet and advised him to follow up with treating office.

## 2022-01-12 NOTE — Unmapped (Signed)
Patient called to report that he is still unable to get his savaysa. Per Dr. Donnetta Hutching last note, I recommended that he calls his cardiologist to work on anticoagulation due to his intolerance to eliquis and xarelto in the past. Patient verbalized understanding and said he would contact cardiology.

## 2022-01-13 ENCOUNTER — Ambulatory Visit (INDEPENDENT_AMBULATORY_CARE_PROVIDER_SITE_OTHER): Payer: 59

## 2022-01-13 ENCOUNTER — Ambulatory Visit (INDEPENDENT_AMBULATORY_CARE_PROVIDER_SITE_OTHER): Payer: 59 | Admitting: Podiatry

## 2022-01-13 DIAGNOSIS — E119 Type 2 diabetes mellitus without complications: Secondary | ICD-10-CM | POA: Diagnosis not present

## 2022-01-13 DIAGNOSIS — L97522 Non-pressure chronic ulcer of other part of left foot with fat layer exposed: Secondary | ICD-10-CM

## 2022-01-13 DIAGNOSIS — M779 Enthesopathy, unspecified: Secondary | ICD-10-CM

## 2022-01-13 MED ORDER — GENTAMICIN SULFATE 0.1 % EX CREA: 1.0000 | TOPICAL_CREAM | Freq: Two times a day (BID) | CUTANEOUS | 1 refills | Status: AC

## 2022-01-15 NOTE — Progress Notes (Signed)
Chief Complaint  Patient presents with   wound on left foot on the top    Patient is here stating that he has had the wound on the top of his left foot for 2 months.    HPI: 76 y.o. male PMHx diabetes mellitus presenting today for evaluation of a superficial wound to the dorsal aspect of his left foot for the last 2 months.  Slow healing.  He presents today to have it evaluated.  He denies a history of injury.  He is not sure how it began.  Past Medical History:  Diagnosis Date   Arthritis    hands, knees   Atrial fibrillation (HCC)    CHF (congestive heart failure) (HCC)    COPD (chronic obstructive pulmonary disease) (HCC)    Diabetes mellitus without complication (HCC)    Diabetic neuropathy (HCC)    Heart murmur    in past, mild 1969   Hypertension    resolved after weight loss and more activity   Neuromuscular disorder (HCC)    neuropathy   Neuropathy    Pulmonary embolism (HCC)    approx 2014   Shingles '99   Sleep apnea    CPAP   Wears dentures    full upper and lower    Past Surgical History:  Procedure Laterality Date   CATARACT EXTRACTION W/PHACO Left 05/12/2019   Procedure: CATARACT EXTRACTION PHACO AND INTRAOCULAR LENS PLACEMENT ([facility]) LEFTDIABETIC ISTENT INJ 2.43  00:28.6;  Surgeon: Nevada Crane, MD;  [facility];  Service: Ophthalmology;  Laterality: Left;  Diabetic - oral meds sleep apnea   CATARACT EXTRACTION W/PHACO Right 06/02/2019   Procedure: CATARACT EXTRACTION PHACO AND INTRAOCULAR LENS PLACEMENT ([facility]) ISTENT INJ  RIGHT DIABETIC 1.31  00:25.4;  Surgeon: Nevada Crane, MD;  Location: [facility];  Service: Ophthalmology;  Laterality: Right;   COLONOSCOPY  08/30/2015   FINGER SURGERY Right    3'rd finger, right hand   KNEE SURGERY Right    screw in knee   WRIST SURGERY Left    metal removed    Allergies  Allergen Reactions   Rosuvastatin Anaphylaxis    "felt bad"   Ace Inhibitors Anxiety   Lisinopril      "had me climbing walls"   Simbrinza [Brinzolamide-Brimonidine] Itching    And burning       Physical Exam: General: The patient is alert and oriented x3 in no acute distress.  Dermatology: Ulcer noted to the dorsum of the left foot that measures approximately 1.5 x 1.5 x 0.2 cm.  After debridement there is a healthy granular wound base with some intermixed fibrous tissue.  No significant drainage.  Periwound is intact with pink borders.  No clinical indication of infection  Vascular: Palpable pedal pulses bilaterally. Capillary refill within normal limits.  Negative for any significant edema or erythema  Neurological: Light touch and protective threshold grossly intact  Musculoskeletal Exam: No pedal deformities noted  Radiographic Exam:  Normal osseous mineralization. Joint spaces preserved. No fracture/dislocation/boney destruction.    Assessment: 1.  Encounter for diabetic foot exam 2.  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 Patient evaluated. X-Rays reviewed.  2.  Comprehensive diabetic foot exam performed today 3.  Medically necessary excisional debridement including subcutaneous tissue was performed to the ulcer.  Excisional debridement of the necrotic nonviable tissue down to healthier bleeding viable tissue was performed with postdebridement measurement same as pre- 4.  Prescription for  gentamicin cream apply daily with a light dressing 5.  Return to clinic in 4 weeks      Edrick Kins, DPM [facility]  Dr. Edrick Kins, DPM    [address], [place] [address]                Office [phone]  Fax [phone]

## 2022-01-16 ENCOUNTER — Ambulatory Visit: Payer: 59

## 2022-01-16 DIAGNOSIS — M5459 Other low back pain: Secondary | ICD-10-CM

## 2022-01-16 DIAGNOSIS — R262 Difficulty in walking, not elsewhere classified: Secondary | ICD-10-CM

## 2022-01-16 DIAGNOSIS — M6281 Muscle weakness (generalized): Secondary | ICD-10-CM

## 2022-01-16 DIAGNOSIS — R269 Unspecified abnormalities of gait and mobility: Secondary | ICD-10-CM

## 2022-01-16 NOTE — Therapy (Signed)
OUTPATIENT PHYSICAL THERAPY TREATMENT NOTE      MRN: [id number] Patient Name: Mario [name] DOB:March 06, 1946, 76 y.o., male Today's Date: 01/16/2022   PT End of Session - 01/16/22 0810     Visit Number 12    Number of Visits 16    Date for PT Re-Evaluation 03/13/22    PT Start Time 0804    PT Stop Time 3151    PT Time Calculation (min) 40 min    Activity Tolerance Patient tolerated treatment well;No increased pain    Behavior During Therapy WFL for tasks assessed/performed                   Past Medical History:  Diagnosis Date   Arthritis    hands, knees   Atrial fibrillation (HCC)    CHF (congestive heart failure) (HCC)    COPD (chronic obstructive pulmonary disease) (HCC)    Diabetes mellitus without complication (HCC)    Diabetic neuropathy (HCC)    Heart murmur    in past, mild 1969   Hypertension    resolved after weight loss and more activity   Neuromuscular disorder ([place])    neuropathy   Neuropathy    Pulmonary embolism ([place])    approx 2014   Shingles '99   Sleep apnea    CPAP   Wears dentures    full upper and lower   Past Surgical History:  Procedure Laterality Date   CATARACT EXTRACTION W/PHACO Left 05/12/2019   Procedure: CATARACT EXTRACTION PHACO AND INTRAOCULAR LENS PLACEMENT ([place]) LEFTDIABETIC ISTENT INJ 2.43  00:28.6;  Surgeon: Eulogio Bear, MD;  Location: [place];  Service: Ophthalmology;  Laterality: Left;  Diabetic - oral meds sleep apnea   CATARACT EXTRACTION W/PHACO Right 06/02/2019   Procedure: CATARACT EXTRACTION PHACO AND INTRAOCULAR LENS PLACEMENT (IOC) ISTENT INJ  RIGHT DIABETIC 1.31  00:25.4;  Surgeon: Eulogio Bear, MD;  Location: [place];  Service: Ophthalmology;  Laterality: Right;   COLONOSCOPY  08/30/2015   FINGER SURGERY Right    3'rd finger, right hand   KNEE SURGERY Right    screw in knee   WRIST SURGERY Left    metal removed   Patient Active Problem List   Diagnosis Date Noted    Spinal stenosis, lumbar region, with neurogenic claudication 10/06/2021   Sacroiliac joint pain 10/06/2021   Piriformis muscle pain 10/06/2021   Bilateral primary osteoarthritis of knee 06/11/2019   Chronic radicular lumbar pain 06/11/2019   Cervical facet joint syndrome 06/11/2019   Lumbar facet arthropathy 06/11/2019   Lumbar radiculopathy 06/11/2019   Chronic pain syndrome 06/11/2019   Lung nodule 02/09/2016   Exercise hypoxemia 12/28/2015   Chronic obstructive pulmonary disease ([place]) 03/16/2015   Nuclear sclerosis 03/16/2015   OSA (obstructive sleep apnea) 03/16/2015   Primary open angle glaucoma of both eyes, severe stage 03/16/2015   Type 2 diabetes mellitus without complication, without long-term current use of insulin ([place]) 03/16/2015   Arthralgia of left lower leg 10/29/2014   Osteoarthritis of left knee 09/10/2014   Type 2 diabetes mellitus with diabetic neuropathy ([place]) 08/04/2014   Cataract, nuclear sclerotic senile 04/01/2014   Combined forms of age-related cataract 04/01/2014   Keratitis sicca, bilateral ([place]) 04/01/2014   Primary open angle glaucoma 02/27/2014   Flat feet 01/29/2014   Foot pain, bilateral 01/29/2014   Tinea pedis of both feet 01/29/2014   Ptosis of both eyelids 11/25/2013   Type 2 diabetes mellitus without retinopathy ([place]) 11/25/2013   Onychomycosis  10/25/9483   Chronic systolic heart failure ([place]) 11/11/2013   Emphysema of lung ([place]) 11/11/2013   Essential hypertension 11/11/2013   Screen for colon cancer 11/05/2013   Cardiomyopathy ([place]) 46/27/0350   Systolic HF (heart failure) ([place]) 09/38/1829   Acute systolic CHF (congestive heart failure) ([place]) 09/16/2013   COPD (chronic obstructive pulmonary disease) ([place]) 09/16/2013   HTN (hypertension) 09/16/2013   Obesity 09/16/2013   PAF (paroxysmal atrial fibrillation) ([place]) 09/16/2013   Senile nuclear sclerosis 05/22/2013   Muscle weakness (generalized) 11/11/2009   Long term current use of  anticoagulant therapy 06/17/2009   Pulmonary embolism ([place]) 06/17/2009   ED (erectile dysfunction) of organic origin 05/13/2003    PCP: Delma Freeze MD  REFERRING PROVIDER: Girtha Hake I MD   REFERRING DIAG: back and LE pain   Rationale for Evaluation and Treatment Rehabilitation  THERAPY DIAG:  Other low back pain  Muscle weakness (generalized)  Difficulty in walking, not elsewhere classified  Abnormality of gait and mobility  ONSET DATE: about 3 months ago.   SUBJECTIVE:                                                                                                                                                                                           SUBJECTIVE STATEMENT: Pt is in pain today. A lot of pain over the weekend. Pt perseveres through and does his HEP.    PERTINENT HISTORY:  Patient presents to physical therapy for low back and LE pain. PMH includes: arthritis, a fib, CHF, COPD, DM, neuropathy, heart murmur, HTN, PE, shingles, spinal stenosis, SI pain, bilateral OA of knees, lumbar facet arthropathy, lumbar radiculopathy, chronic pain syndrome, lung nodule, exercise hypoxemia, glaucoma of bilateral eyes, flat feet, onychomycosis, PAF. Per patient notes patient was recommended surgery by orthopedics but patient declined. Since patient was seen last in this clinic he has been in the ED three times. Patient reports his sciatic pain has been worsening in the past three months.   PAIN:  Are you having pain? No: NPRS scale: 4/10 Pain location: low back, buttock Pain description: radiating Aggravating factors: walking, moving Relieving factors: rest   PRECAUTIONS: Fall  WEIGHT BEARING RESTRICTIONS No    OCCUPATION: retired  PLOF: Independent with basic ADLs  PATIENT GOALS to have less pain and be able to walk again    TODAY'S TREATMENT  Therex:  Hamstrings Stretch 3x30sec bilat  Figure Four Hip FABER Stretch 3x30sec bilat  SKTC Stretch  3x30sec bilat   seated marching 1x40  elevated STS x8  seated marching 1x40  elevated STS x8    PATIENT EDUCATION:  Education details: Pt  educated throughout session about proper posture and technique with exercises. Improved exercise technique, movement at target joints, use of target muscles after min to mod verbal, visual, tactile cues.  Person educated: Patient Education method: Explanation, Demonstration, Tactile cues, and Verbal cues Education comprehension: verbalized understanding, returned demonstration, verbal cues required, tactile cues required, and needs further education   HOME EXERCISE PROGRAM:   No updates 11/14/2021 Access Code: [email or web address] URL: [email or web address] Date: 10/24/2021 Prepared by: Janna Arch  Exercises - Supine Lower Trunk Rotation  - 1 x daily - 7 x weekly - 2 sets - 10 reps - 5 hold - Supine Posterior Pelvic Tilt  - 1 x daily - 7 x weekly - 2 sets - 10 reps - 5 hold - Supine Figure 4 Piriformis Stretch  - 1 x daily - 7 x weekly - 2 sets - 10 reps - 5 hold - Seated Hamstring Stretch  - 1 x daily - 7 x weekly - 2 sets - 2 reps - 30 hold  ASSESSMENT:  CLINICAL IMPRESSION: Continued with core strength and interventions aimed improving pain levels. Pt follows educational cues well. The patient will benefit from skilled physical therapy to decrease pain, improve mobility, and increase strength for improved quality of life.    OBJECTIVE IMPAIRMENTS Abnormal gait, cardiopulmonary status limiting activity, decreased activity tolerance, decreased balance, decreased coordination, decreased endurance, decreased mobility, difficulty walking, decreased ROM, decreased strength, hypomobility, impaired perceived functional ability, increased muscle spasms, impaired flexibility, improper body mechanics, postural dysfunction, and pain.   ACTIVITY LIMITATIONS carrying, lifting, bending, sitting, standing, squatting, sleeping, stairs, transfers, bed  mobility, bathing, toileting, dressing, reach over head, hygiene/grooming, and locomotion level  PARTICIPATION LIMITATIONS: meal prep, cleaning, laundry, driving, shopping, community activity, and yard work  PERSONAL FACTORS Age, Fitness, Past/current experiences, Time since onset of injury/illness/exacerbation, Transportation, and 3+ comorbidities: arthritis, a fib, CHF, COPD, DM, neuropathy, heart murmur, HTN, PE, shingles, spinal stenosis, SI pain, bilateral OA of knees, lumbar facet arthropathy, lumbar radiculopathy, chronic pain syndrome, lung nodule, exercise hypoxemia, glaucoma of bilateral eyes, flat feet, onychomycosis, PAF  are also affecting patient's functional outcome.   REHAB POTENTIAL: Fair    CLINICAL DECISION MAKING: Evolving/moderate complexity  EVALUATION COMPLEXITY: Moderate   GOALS: Goals reviewed with patient? Yes  SHORT TERM GOALS: Target date: 11/21/2021  Patient will be independent in home exercise program to improve strength/mobility for better functional independence with ADLs. Baseline: 6/26: Hep given; 01/11/2022= patient reports doing well with stretches at home and no questions about technique  Goal status: GOAL MET  2.  Patient will report a worst pain of 5/10 on VAS in   back and LE's to improve tolerance with ADLs and reduced symptoms with activities.  Baseline: 6/26: 10/10; 01/03/2022= Pain is worse since fall this past weekend= 7/10; Goal status: GOAL MET LONG TERM GOALS: Target date: 12/19/2021  Patient will increase FOTO score to equal to or greater than  53   to demonstrate statistically significant improvement in mobility and quality of life.  Baseline: 6/26: 41%:  Goal status: INITIAL  2.  Patient will report a worst pain of 3/10 on VAS in  low back and LEs  to improve tolerance with ADLs and reduced symptoms with activities.  Baseline:6/26: 10/10;  01/03/2022= Pain is worse since fall this past weekend= 7/10.  01/11/2022= Patient currently denies pain  since injection on Monday Goal status: ONGOING  3.  Patient (> 74 years old) will complete five times sit to stand test in < 15  seconds indicating an increased LE strength and improved balance. Baseline: 6/26: 21.56 seconds with heavy BUE support; 01/11/2022= 12.58 sec without UE support.  Goal status: Will keep goal active to ensure patient able to perform consistently  4.   Patient will reduce modified Oswestry score to <20 as to demonstrate minimal disability with ADLs including improved sleeping tolerance, walking/sitting tolerance etc for better mobility with ADLs Baseline: 6/26: 60%;  Goal status:   5.  Patient will increase 10 meter walk test to >1.55ms as to improve gait speed for better community ambulation and to reduce fall risk. Baseline: 6/26: unable to ambulate distance; 01/11/2022= 0.72 m/s without an AD Goal status: ONGOING    PLAN: PT FREQUENCY: 2x/week  PT DURATION: 8 weeks  PLANNED INTERVENTIONS: Therapeutic exercises, Therapeutic activity, Neuromuscular re-education, Balance training, Gait training, Patient/Family education, Joint manipulation, Joint mobilization, Stair training, DME instructions, Dry Needling, Spinal mobilization, Cryotherapy, Moist heat, Taping, Traction, Ultrasound, Ionotophoresis 438mml Dexamethasone, and Manual therapy.    8:13 AM, 01/16/22 AlEtta GrandchildPT, DPT Physical Therapist - [phone]    01/16/2022, 8:13 AM

## 2022-01-18 ENCOUNTER — Ambulatory Visit: Payer: 59

## 2022-01-18 DIAGNOSIS — R262 Difficulty in walking, not elsewhere classified: Secondary | ICD-10-CM

## 2022-01-18 DIAGNOSIS — M6281 Muscle weakness (generalized): Secondary | ICD-10-CM

## 2022-01-18 DIAGNOSIS — M5459 Other low back pain: Secondary | ICD-10-CM

## 2022-01-18 DIAGNOSIS — R269 Unspecified abnormalities of gait and mobility: Secondary | ICD-10-CM

## 2022-01-18 NOTE — Therapy (Signed)
OUTPATIENT PHYSICAL THERAPY TREATMENT NOTE      MRN: [id number] Patient Name: Mario [name] DOB:[birth year], 76 y.o., male Today's Date: 01/18/2022   PT End of Session - 01/18/22 0808     Visit Number 13    Number of Visits 16    Date for PT Re-Evaluation 03/13/22    PT Start Time 0801    PT Stop Time 7680    PT Time Calculation (min) 42 min    Activity Tolerance Patient tolerated treatment well;No increased pain    Behavior During Therapy WFL for tasks assessed/performed                   Past Medical History:  Diagnosis Date   Arthritis    hands, knees   Atrial fibrillation (HCC)    CHF (congestive heart failure) (HCC)    COPD (chronic obstructive pulmonary disease) (HCC)    Diabetes mellitus without complication (HCC)    Diabetic neuropathy (HCC)    Heart murmur    in past, mild 1969   Hypertension    resolved after weight loss and more activity   Neuromuscular disorder ([place])    neuropathy   Neuropathy    Pulmonary embolism ([place])    approx 2014   Shingles '99   Sleep apnea    CPAP   Wears dentures    full upper and lower   Past Surgical History:  Procedure Laterality Date   CATARACT EXTRACTION W/PHACO Left 05/12/2019   Procedure: CATARACT EXTRACTION PHACO AND INTRAOCULAR LENS PLACEMENT ([place]) LEFTDIABETIC ISTENT INJ 2.43  00:28.6;  Surgeon: Eulogio Bear, MD;  Location: [place];  Service: Ophthalmology;  Laterality: Left;  Diabetic - oral meds sleep apnea   CATARACT EXTRACTION W/PHACO Right 06/02/2019   Procedure: CATARACT EXTRACTION PHACO AND INTRAOCULAR LENS PLACEMENT ([facility]) ISTENT INJ  RIGHT DIABETIC 1.31  00:25.4;  Surgeon: Eulogio Bear, MD;  Location: [place];  Service: Ophthalmology;  Laterality: Right;   COLONOSCOPY  08/30/2015   FINGER SURGERY Right    3'rd finger, right hand   KNEE SURGERY Right    screw in knee   WRIST SURGERY Left    metal removed   Patient Active Problem List   Diagnosis Date Noted    Spinal stenosis, lumbar region, with neurogenic claudication 10/06/2021   Sacroiliac joint pain 10/06/2021   Piriformis muscle pain 10/06/2021   Bilateral primary osteoarthritis of knee 06/11/2019   Chronic radicular lumbar pain 06/11/2019   Cervical facet joint syndrome 06/11/2019   Lumbar facet arthropathy 06/11/2019   Lumbar radiculopathy 06/11/2019   Chronic pain syndrome 06/11/2019   Lung nodule 02/09/2016   Exercise hypoxemia 12/28/2015   Chronic obstructive pulmonary disease ([place]) 03/16/2015   Nuclear sclerosis 03/16/2015   OSA (obstructive sleep apnea) 03/16/2015   Primary open angle glaucoma of both eyes, severe stage 03/16/2015   Type 2 diabetes mellitus without complication, without long-term current use of insulin ([place]) 03/16/2015   Arthralgia of left lower leg 10/29/2014   Osteoarthritis of left knee 09/10/2014   Type 2 diabetes mellitus with diabetic neuropathy ([place]) 08/04/2014   Cataract, nuclear sclerotic senile 04/01/2014   Combined forms of age-related cataract 04/01/2014   Keratitis sicca, bilateral ([place]) 04/01/2014   Primary open angle glaucoma 02/27/2014   Flat feet 01/29/2014   Foot pain, bilateral 01/29/2014   Tinea pedis of both feet 01/29/2014   Ptosis of both eyelids 11/25/2013   Type 2 diabetes mellitus without retinopathy ([place]) 11/25/2013   Onychomycosis  37/16/9678   Chronic systolic heart failure ([place]) 11/11/2013   Emphysema of lung ([place]) 11/11/2013   Essential hypertension 11/11/2013   Screen for colon cancer 11/05/2013   Cardiomyopathy ([place]) 93/81/0175   Systolic HF (heart failure) ([place]) 02/22/8526   Acute systolic CHF (congestive heart failure) ([place]) 09/16/2013   COPD (chronic obstructive pulmonary disease) ([place]) 09/16/2013   HTN (hypertension) 09/16/2013   Obesity 09/16/2013   PAF (paroxysmal atrial fibrillation) ([place]) 09/16/2013   Senile nuclear sclerosis 05/22/2013   Muscle weakness (generalized) 11/11/2009   Long term current use of  anticoagulant therapy 06/17/2009   Pulmonary embolism ([place]) 06/17/2009   ED (erectile dysfunction) of organic origin 05/13/2003    PCP: Delma Freeze MD  REFERRING PROVIDER: Girtha Hake I MD   REFERRING DIAG: back and LE pain   Rationale for Evaluation and Treatment Rehabilitation  THERAPY DIAG:  Other low back pain  Muscle weakness (generalized)  Difficulty in walking, not elsewhere classified  Abnormality of gait and mobility  ONSET DATE: about 3 months ago.   SUBJECTIVE:                                                                                                                                                                                           SUBJECTIVE STATEMENT: Pt reports pain ongoing with little to no relief- 5/10 today    PERTINENT HISTORY:  Patient presents to physical therapy for low back and LE pain. PMH includes: arthritis, a fib, CHF, COPD, DM, neuropathy, heart murmur, HTN, PE, shingles, spinal stenosis, SI pain, bilateral OA of knees, lumbar facet arthropathy, lumbar radiculopathy, chronic pain syndrome, lung nodule, exercise hypoxemia, glaucoma of bilateral eyes, flat feet, onychomycosis, PAF. Per patient notes patient was recommended surgery by orthopedics but patient declined. Since patient was seen last in this clinic he has been in the ED three times. Patient reports his sciatic pain has been worsening in the past three months.   PAIN:  Are you having pain? No: NPRS scale: 5/10 Pain location: low back, buttock Pain description: radiating Aggravating factors: walking, moving Relieving factors: rest   PRECAUTIONS: Fall  WEIGHT BEARING RESTRICTIONS No    OCCUPATION: retired  PLOF: Independent with basic ADLs  PATIENT GOALS to have less pain and be able to walk again    TODAY'S TREATMENT  Therex: (moist heat while performing supine therex)  Instruction in core stabilization- Transverse TA contraction- hold 5 sec x 10  reps TA contraction with Hip add ball squeeze - hold 5 sec x 10 reps Lower trunk rotation x 20 reps each direction with TA contraction TA contraction with B Hip abd (butterfly)  with GTB x 10 reps Resisted knee to chest with GTB(tied around distal quads)  S[place]e Hip ER ([place]) with TA contraction and use of GTB x 12 reps each side  Figure Four Hip FABER Stretch 3x30sec bilat  SKTC Stretch 3x30sec bilat     Seated Lumbar flex stretch - rolling silver theraball - center, Left center, right center x 15 each  PATIENT EDUCATION:  Education details: Pt educated throughout session about proper posture and technique with exercises. Improved exercise technique, movement at target joints, use of target muscles after min to mod verbal, visual, tactile cues.  Person educated: Patient Education method: Explanation, Demonstration, Tactile cues, and Verbal cues Education comprehension: verbalized understanding, returned demonstration, verbal cues required, tactile cues required, and needs further education   HOME EXERCISE PROGRAM:   No updates 11/14/2021 Access Code: [email or web address] URL: [email or web address] Date: 10/24/2021 Prepared by: Janna Arch  Exercises - Supine Lower Trunk Rotation  - 1 x daily - 7 x weekly - 2 sets - 10 reps - 5 hold - Supine Posterior Pelvic Tilt  - 1 x daily - 7 x weekly - 2 sets - 10 reps - 5 hold - Supine Figure 4 Piriformis Stretch  - 1 x daily - 7 x weekly - 2 sets - 10 reps - 5 hold - Seated Hamstring Stretch  - 1 x daily - 7 x weekly - 2 sets - 2 reps - 30 hold  ASSESSMENT:  CLINICAL IMPRESSION: Plan of care continued to focus on core stabilization and mobility. He was able to progress overall and try more core exercises without report of increased low back pain. He was able to engage TA muscles correctly with cueing to progress several exercises.  The patient will benefit from skilled physical therapy to decrease pain, improve mobility, and  increase strength for improved quality of life.    OBJECTIVE IMPAIRMENTS Abnormal gait, cardiopulmonary status limiting activity, decreased activity tolerance, decreased balance, decreased coordination, decreased endurance, decreased mobility, difficulty walking, decreased ROM, decreased strength, hypomobility, impaired perceived functional ability, increased muscle spasms, impaired flexibility, improper body mechanics, postural dysfunction, and pain.   ACTIVITY LIMITATIONS carrying, lifting, bending, sitting, standing, squatting, sleeping, stairs, transfers, bed mobility, bathing, toileting, dressing, reach over head, hygiene/grooming, and locomotion level  PARTICIPATION LIMITATIONS: meal prep, cleaning, laundry, driving, shopping, community activity, and yard work  PERSONAL FACTORS Age, Fitness, Past/current experiences, Time since onset of injury/illness/exacerbation, Transportation, and 3+ comorbidities: arthritis, a fib, CHF, COPD, DM, neuropathy, heart murmur, HTN, PE, shingles, spinal stenosis, SI pain, bilateral OA of knees, lumbar facet arthropathy, lumbar radiculopathy, chronic pain syndrome, lung nodule, exercise hypoxemia, glaucoma of bilateral eyes, flat feet, onychomycosis, PAF  are also affecting patient's functional outcome.   REHAB POTENTIAL: Fair    CLINICAL DECISION MAKING: Evolving/moderate complexity  EVALUATION COMPLEXITY: Moderate   GOALS: Goals reviewed with patient? Yes  SHORT TERM GOALS: Target date: 11/21/2021  Patient will be independent in home exercise program to improve strength/mobility for better functional independence with ADLs. Baseline: 6/26: Hep given; 01/11/2022= patient reports doing well with stretches at home and no questions about technique  Goal status: GOAL MET  2.  Patient will report a worst pain of 5/10 on VAS in   back and LE's to improve tolerance with ADLs and reduced symptoms with activities.  Baseline: 6/26: 10/10; 01/03/2022= Pain is worse  since fall this past weekend= 7/10; Goal status: GOAL MET LONG TERM GOALS: Target date: 12/19/2021  Patient will increase FOTO score to equal to or  greater than  53   to demonstrate statistically significant improvement in mobility and quality of life.  Baseline: 6/26: 41%:  Goal status: INITIAL  2.  Patient will report a worst pain of 3/10 on VAS in  low back and LEs  to improve tolerance with ADLs and reduced symptoms with activities.  Baseline:6/26: 10/10;  01/03/2022= Pain is worse since fall this past weekend= 7/10.  01/11/2022= Patient currently denies pain since injection on Monday Goal status: ONGOING  3.  Patient (> 23 years old) will complete five times sit to stand test in < 15 seconds indicating an increased LE strength and improved balance. Baseline: 6/26: 21.56 seconds with heavy BUE support; 01/11/2022= 12.58 sec without UE support.  Goal status: Will keep goal active to ensure patient able to perform consistently  4.   Patient will reduce modified Oswestry score to <20 as to demonstrate minimal disability with ADLs including improved sleeping tolerance, walking/sitting tolerance etc for better mobility with ADLs Baseline: 6/26: 60%;  Goal status:   5.  Patient will increase 10 meter walk test to >1.82ms as to improve gait speed for better community ambulation and to reduce fall risk. Baseline: 6/26: unable to ambulate distance; 01/11/2022= 0.72 m/s without an AD Goal status: ONGOING    PLAN: PT FREQUENCY: 2x/week  PT DURATION: 8 weeks  PLANNED INTERVENTIONS: Therapeutic exercises, Therapeutic activity, Neuromuscular re-education, Balance training, Gait training, Patient/Family education, Joint manipulation, Joint mobilization, Stair training, DME instructions, Dry Needling, Spinal mobilization, Cryotherapy, Moist heat, Taping, Traction, Ultrasound, Ionotophoresis 454mml Dexamethasone, and Manual therapy.    9:39 AM, 01/18/22 JeOllen BowlPT Physical Therapist - [phone]    01/18/2022, 9:39 AM

## 2022-01-23 ENCOUNTER — Ambulatory Visit
Payer: 59 | Attending: Student in an Organized Health Care Education/Training Program | Admitting: Student in an Organized Health Care Education/Training Program

## 2022-01-23 ENCOUNTER — Ambulatory Visit: Payer: 59

## 2022-01-25 ENCOUNTER — Ambulatory Visit: Payer: 59

## 2022-01-26 ENCOUNTER — Ambulatory Visit: Payer: 59 | Admitting: Podiatry

## 2022-01-30 ENCOUNTER — Ambulatory Visit: Payer: Medicare Other

## 2022-01-30 NOTE — Unmapped (Signed)
Endocrinology Follow-up      Paul Velez is a 76 y.o. male seen in Endocrinology consultation follow-up on 02/01/22 for   For his DM2 with Hba1d slightly higher from 6.5 to now 6.7, with ckd3a, cmp, neurop, chg, lung disease, and mood d/o      Last seen by me 09/14/21 for diabetes initial consultation. He is a 76 year old male with diabetes, hypertension, hyperlipidemia, lung disease, oxygen dependent and cmp. He was originally diagnosed with diabetes around 2008.     Patient's current diabetic medication regimen includes metformin 1000 mg BID, Ozempic 1 mg weekly, and Jardiance 10 mg daily. His home BS are mostly in range. Toleratesthe meds well without s.e.  Only co fatigue. Is on 2L o2 in office.    Glucometer download bs [phone] with one value of 209 on several weeks of daily am bs. No hypos.     Also experiences neuropathy which is well controlled while patient takes gabapentin and Cymbalta.     Referring Provider: Claudean Kinds     Primary Provider: Claudean Kinds, MD      Assessment/Plan:     Type 2 diabetes mellitus with other specified complication, without long-term current use of insulin (CMS-HCC)  Assessment & Plan:  Excellent A1c and sugar control on review today is good. Will continue same regimen 0zempic1, jardiance10, metformin 1g bid (this one with caution for reduction if renal deteriorates over time). Cautioned to remain hydrated and dietary/lifestyle counselling and any barriers reviewed    If he remains well he can return for reassessment of his diabetes management in 6 months. He may choose to followup with his PCP for this down the road if continues stable and I will leave that decision to them. We will book a 6 mo fu      Orders:  -     POCT Glucose    Diabetic peripheral neuropathy (CMS-HCC)  -     POCT Glucose    Hyperlipidemia associated with type 2 diabetes mellitus (CMS-HCC)  -     POCT Glucose    Cardiomyopathy, unspecified type (CMS-HCC)    Stage 3a chronic kidney disease (CMS-HCC)  Assessment & Plan:  Stable             Return in about 6 months (around 08/03/2022) for office visit, diabetes, w/ Dr. Amalia Hailey.    Patient Instructions           Thank you for choosing [facility] Endocrinology with Dr. Amalia Hailey for your medical care!  If you have any questions about your appointment today, please call our office to speak with our nurse.  Speak With a Nurse   [facility] Endocrinology at [facility]  [phone] ext. [phone] [facility] Endocrinology at [facility]  [phone] ext. [phone]     [place] MyChart message replies can take 3 business days. [place] MyChart should not be used for urgent or emergent medical needs. [place] MyChart should not be used for new problems or multiple concerns. If you have multiple concerns, please call our office to schedule an appointment. [facility] MyChart is not checked on nights, weekends, or holidays.    Schedule an Appointment   [facility] Endocrinology at [facility]  [phone] ext. [phone]  Endocrinology at [facility]  [phone] ext. [phone]     If you are experiencing a medical emergency, please call 911 or proceed to your nearest emergency room.     If you have billing questions, please contact [facility] Billing Department at [phone] Monday -  Thursday 8:00AM - 5:00PM and Friday 8:00AM - 11:30AM. For confidentiality purposes, you will need your [facility] account number and other patient identifying information before they can discuss your account with you.        Subjective:     History of Present Illness: See problem oriented charting.    Past Medical History:   Diagnosis Date    Allergic     CHF (congestive heart failure) (CMS-HCC)     COPD (chronic obstructive pulmonary disease) (CMS-HCC)     Diabetes type 2, controlled (CMS-HCC)     HLD (hyperlipidemia)     HTN (hypertension)     Neuropathy     OA (osteoarthritis) of knee     PAF (paroxysmal atrial fibrillation) (CMS-HCC)     Sarcoidosis         No past surgical history on file.    Current Outpatient Medications   Medication Sig Dispense Refill albuterol sulfate 90 mcg/actuation AePB Inhale.      amLODIPine (NORVASC) 5 MG tablet Take 1 tablet (5 mg total) by mouth daily.      atorvastatin (LIPITOR) 20 MG tablet       buPROPion (WELLBUTRIN SR) 100 MG 12 hr tablet Take 1 tablet (100 mg total) by mouth in the morning. 60 tablet 2    celecoxib (CELEBREX) 200 MG capsule Take 1 capsule (200 mg total) by mouth two (2) times a day as needed for pain. 90 capsule 2    clobetasoL (TEMOVATE) 0.05 % ointment Apply topically Two (2) times a day. Use on psoriasis until smooth. 60 g 1    DULoxetine (CYMBALTA) 60 MG capsule Take 1 capsule (60 mg total) by mouth daily. 90 capsule 3    edoxaban (SAVAYSA) 60 mg tablet Take 1 tablet (60 mg total) by mouth daily. (Patient not taking: Reported on 12/26/2021) 90 tablet 3    empagliflozin (JARDIANCE) 10 mg tablet Take 1 tablet (10 mg total) by mouth daily. 90 tablet 3    empty container Misc Use as directed to dispose of [employer]. 1 each 2    furosemide (LASIX) 80 MG tablet Take 1 tablet (80 mg total) by mouth daily. 90 tablet 3    gabapentin (NEURONTIN) 800 MG tablet Take 1 tablet (800 mg total) by mouth Three (3) times a day.      hydroCHLOROthiazide (HYDRODIURIL) 12.5 MG tablet       hydrocortisone 2.5 % cream Apply topically Two (2) times a day. Apply behind your ears. 30 g 1    losartan (COZAAR) 50 MG tablet Take 1 tablet (50 mg total) by mouth daily.      metFORMIN (GLUCOPHAGE) 1000 MG tablet Take 0.5 tablets (500 mg total) by mouth in the morning and 0.5 tablets (500 mg total) in the evening. Take with meals. 90 tablet 3    mirtazapine (REMERON) 15 MG tablet Take 1 tablet (15 mg total) by mouth nightly. 30 tablet 2    ONETOUCH VERIO TEST STRIPS Strp TEST TWICE DAILY      risankizumab-rzaa (SKYRIZI) 150 mg/mL PnIj Inject the contents of 1 pen (150mg ) under the skin at weeks 0 and 4 as loading doses. 2 mL 0    risankizumab-rzaa 150 mg/mL PnIj Inject the contents of 1 pen (150 mg total) under the skin Every three (3) months. 2 mL 1    semaglutide (OZEMPIC) 1 mg/dose (4 mg/3 mL) PnIj injection Inject 1 mg under the skin every seven (7) days. 9 mL 1  sotalol (BETAPACE) 80 MG tablet Take 1 tablet (80 mg total) by mouth Two (2) times a day.      tamsulosin (FLOMAX) 0.4 mg capsule Take 1 capsule (0.4 mg total) by mouth daily. 90 capsule 3    tiZANidine (ZANAFLEX) 2 MG tablet Take 1 tablet (2 mg total) by mouth every eight (8) hours as needed (back spasms). 30 tablet 2    TRELEGY ELLIPTA 100-62.5-25 mcg inhaler       triamcinolone (KENALOG) 0.1 % cream Apply topically Two (2) times a day. For your legs. 453.6 g 2    umeclidinium (INCRUSE ELLIPTA) 62.5 mcg/actuation inhaler Inhale 1 puff daily.       No current facility-administered medications for this visit.        Allergies   Allergen Reactions    Rosuvastatin Anaphylaxis     felt bad    Ace Inhibitors Anxiety    Lisinopril Other (See Comments)     Other reaction(s): Mental Status Change, Cough    Brinzolamide-Brimonidine Itching     And burning  And burning         Family History   Problem Relation Age of Onset    Melanoma Neg Hx     Basal cell carcinoma Neg Hx     Squamous cell carcinoma Neg Hx            Review of Systems -  negative except as noted in HPI.     Objective:     Physical Exam:  There were no vitals taken for this visit.  General appearance - alert, well appearing, and in no distress.  Eyes - No lid lag or stare, no proptosis, EOM's intact.  Mouth - mucous membranes moist.  Neck - supple, thyroid normal in size without nodules or tenderness.  Lymphatics - no cervical or supraclavicular adenopathy appreciated.  Chest - clear, good excursion.  Heart - normal rate, regular rhythm.  Abdomen - non-distended, soft.   Neurological - no hand tremors. Normal gait.  Extremities - extremities warm. No lower extremity edema.  Skin - warm, dry, no visible rashes.  Psych - Normal mood, appropriate affect.  Muskuloskeletal - No kyphosis or spine tenderness.      Lab Review:  I've reviewed the patient's most recent pertinent labs in the electronic record and provided records.  No results found for: TSH, T3TOTAL, T4TOTAL, FREET4, TPERXAB, THYROG2NDGEN, THYAC      No results found for: TSH, FREET4, T4TOTAL, FREET3, T3TOTAL, THYROG2NDGEN, THYIAINT, LABTHYR, THYROIDAB, THYROGLB    Lab Results   Component Value Date    CALCIUM 9.3 12/26/2021    CALCIUM 10.5 (H) 06/24/2021    ALBUMIN 3.3 (L) 12/26/2021    VITDTOTAL 63.3 12/26/2021    CREATININE 1.46 (H) 12/26/2021       Lab Results   Component Value Date    A1C 6.7 (H) 12/26/2021    GLU 140 12/26/2021    CREATININE 1.46 (H) 12/26/2021    ALBCRERAT  09/14/2021      Comment:      Unable to calculate.    ALBQTUR <0.3 09/14/2021    CHOL 125 12/26/2021    HDL 48 12/26/2021    NONHDL 77 12/26/2021    LDL 56 12/26/2021    TRIG 811 12/26/2021          Radiology:    Thyroid US:  No results found for this or any previous visit.     No results found for this or any previous  visit.     No results found for this or any previous visit.       DEXA:    No results found for this or any previous visit.             Other Medical Data :       I've personally reviewed and summarized records in [facility] and via CareEverywhere as well as medication, allergies, past medical, social, and family history.     I attest that I, Katheran Awe Rindoks, personally documented this note while acting as scribe for Marvia Pickles, MD.      Katheran Awe Rindoks, Scribe.  02/01/2022     The documentation recorded by the scribe accurately reflects the service I personally performed and the decisions made by me.    Marvia Pickles, MD

## 2022-01-31 NOTE — Progress Notes (Unsigned)
Psychiatric Initial Adult Assessment   Patient Identification: Mario [name] MRN:  [id number] Date of Evaluation:  02/02/2022 Referral Source: Delma Freeze, MD  Chief Complaint:   Chief Complaint  Patient presents with   Establish Care   Visit Diagnosis:    ICD-10-CM   1. MDD (major depressive disorder), recurrent episode, moderate (HCC)  F33.1 TSH      History of Present Illness:   Mario [name] is a 76 y.o. year old male with a history of depression, COPD, CHF, diabetes, diabetic neuropathy, hypertension, pulmonnary emplosim. 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   He was brought by his girlfriend.  He prefers to have the appointment by himself.  He states that his girlfriend made an appointment for him.  According to her, he is always depressed, and has some memory issues.  He states that his mood has been down due to pain secondary to sciatica.  He states that he cannot understand what is wrong with them.  He cannot walk too far or long distance due to COPD and pain.  He feels bothered when his girlfriend took out trash can and take it out, and not him.  She will work on the yard, although he is just sitting on the machine mowing. He wonders what their neiboors think, referring that he does not talk with them. He used to play wrestling, and was a champ four times.  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 However, he states that the greatest moment was the time his baby son won the wrestling. He was very proud of him.  He moved to [place] after having met his girlfriend online.  He had a good relationship with her over the past several years.    Depression-he has depressive symptoms as in PHQ-9.  He has no energy to do things.  He used to enjoy visiting relatives, which he does not do anymore (he lost his uncle from cancer, and reports estranged relationship with  his wife).  He used to enjoy fishing and coaching. He denies SI.  He is in the process of getting CPAP machine was new accessories.   Anxiety-he tends to feel anxious when he cannot do things as he feels upset.  He occasionally has difficulty in concentration; he forgets what he was watching, and needs to replay at times.   Pain-he states that he was struggling with knee pain for many years.  His pain that worsened over the past few months.  It started when he was sitting at the desk, and had pain radiating to the leg.  He sees a pain specialist.   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  He was told to keep others straight as he was the oldest one.  He had 7 siblings.  One of his siblings died at 82 year old to save another kid.  He describes himself as "mommy's boy  and daddy's boy." He reports that his parents were "super great."   Wt Readings from Last 3 Encounters:  02/02/22 235 lb (106.6 kg)  01/09/22 230 lb (104.3 kg)  01/05/22 230 lb (104.3 kg)     Associated Signs/Symptoms: Depression Symptoms:  depressed mood, anhedonia, insomnia, fatigue, difficulty concentrating, anxiety, (Hypo) Manic Symptoms:   denies decreased need for sleep, euphoria Anxiety Symptoms:   mild anxiety  Psychotic Symptoms:   denies AH, VH, paranoia PTSD Symptoms: Negative  Past Psychiatric History:  Outpatient: depression since having sciatica Psychiatry admission: denies   Previous suicide attempt: denies Past trials of medication:  History of violence:  denies   Previous Psychotropic Medications: Yes   Substance Abuse History in the last 12 months:  No.  Consequences of Substance Abuse: NA  Past Medical History:  Past Medical History:  Diagnosis Date   Arthritis    hands, knees   Atrial fibrillation (HCC)    CHF (congestive heart failure) ([place])    COPD (chronic obstructive pulmonary disease) ([place])    Diabetes mellitus without complication ([place])    Diabetic neuropathy ([place])    Heart murmur    in past, mild 1969   Hypertension    resolved after weight loss and more activity   Neuromuscular disorder ([place])    neuropathy   Neuropathy    Pulmonary embolism ([place])    approx 2014   Shingles '99   Sleep apnea    CPAP   Wears dentures    full upper and lower    Past Surgical History:  Procedure Laterality Date   CATARACT EXTRACTION W/PHACO Left 05/12/2019   Procedure: CATARACT EXTRACTION PHACO AND INTRAOCULAR LENS PLACEMENT ([place]) LEFTDIABETIC ISTENT INJ 2.43  00:28.6;  Surgeon: Eulogio Bear, MD;  Location: [facility];  Service: Ophthalmology;  Laterality: Left;  Diabetic - oral meds sleep apnea   CATARACT EXTRACTION W/PHACO Right 06/02/2019   Procedure: CATARACT EXTRACTION PHACO AND INTRAOCULAR LENS PLACEMENT ([facility]) ISTENT INJ  RIGHT DIABETIC 1.31  00:25.4;  Surgeon: Eulogio Bear, MD;  Location: [facility];  Service: Ophthalmology;  Laterality: Right;   COLONOSCOPY  08/30/2015   FINGER SURGERY Right    3'rd finger, right hand   KNEE SURGERY Right    screw in knee   WRIST SURGERY Left    metal removed    Family Psychiatric History: denies  Family History: History reviewed. No pertinent family history.  Social History:   Social History   Socioeconomic History   Marital status: Widowed    Spouse name: Not on file   Number of children: 5   Years of education: Not on file   Highest education level: Associate  degree: occupational, Hotel manager, or vocational program  Occupational History   Not on file  Tobacco Use   Smoking status: Former    Packs/day: 0.50    Years: 1.00    Total pack years: 0.50    Types: Cigarettes    Quit date: 05/01/1969    Years since quitting: 52.7   Smokeless tobacco: Never   Tobacco comments:    Quit 1971  Vaping Use   Vaping Use: Never used  Substance and Sexual Activity   Alcohol use: No   Drug use: No   Sexual activity: Not Currently  Other Topics Concern   Not on file  Social History Narrative   Not on file   Social Determinants of Health   Financial Resource Strain: Not on file  Food Insecurity: Not on  file  Transportation Needs: Not on file  Physical Activity: Not on file  Stress: Not on file  Social Connections: Not on file    Additional Social History: as above  Allergies:   Allergies  Allergen Reactions   Rosuvastatin Anaphylaxis    "felt bad"   Ace Inhibitors Anxiety   Lisinopril     "had me climbing walls"   Simbrinza [Brinzolamide-Brimonidine] Itching    And burning    Metabolic Disorder Labs: Lab Results  Component Value Date   HGBA1C 6.8 (H) 02/15/2016    Ref Range & Units 1 mo ago Comments  Sodium 135 - 145 mmol/L 144    Potassium 3.5 - 5.1 mmol/L 4.4    Chloride 98 - 107 mmol/L 106    CO2 20.0 - 31.0 mmol/L 32.2 High     Anion Gap 5 - 14 mmol/L 6    BUN 9 - 23 mg/dL 23    Creatinine 0.60 - 1.10 mg/dL 1.46 High     BUN/Creatinine Ratio  16    eGFR CKD-EPI (2021) Male >=60 mL/min/1.84m 50 Low       No results found for: "PROLACTIN" No results found for: "CHOL", "TRIG", "HDL", "CHOLHDL", "VLDL", "LDLCALC" No results found for: "TSH"  Therapeutic Level Labs: No results found for: "LITHIUM" No results found for: "CBMZ" No results found for: "VALPROATE"  Current Medications: Current Outpatient Medications  Medication Sig Dispense Refill   albuterol (PROVENTIL HFA;VENTOLIN HFA) 108 (90 Base) MCG/ACT inhaler Inhale  2 puffs into the lungs as needed.     APPLE CIDER VINEGAR PO Take by mouth daily.     atorvastatin (LIPITOR) 20 MG tablet Take 20 mg by mouth daily.     buPROPion (WELLBUTRIN) 100 MG tablet Take 100 mg by mouth 2 (two) times daily.     celecoxib (CELEBREX) 200 MG capsule Take 200 mg by mouth daily.     dorzolamide (TRUSOPT) 2 % ophthalmic solution SMARTSIG:In Eye(s)     DULoxetine (CYMBALTA) 60 MG capsule am     edoxaban (SAVAYSA) 60 MG TABS tablet Take 60 mg by mouth daily.     empagliflozin (JARDIANCE) 10 MG TABS tablet Take by mouth daily.     furosemide (LASIX) 80 MG tablet Take 1 tablet by mouth 2 (two) times daily. 80 mg AM, 120 mg PM     gabapentin (NEURONTIN) 300 MG capsule Take 800 mg by mouth 3 (three) times daily.     gentamicin cream (GARAMYCIN) 0.1 % Apply 1 Application topically 2 (two) times daily. 30 g 1   glucose blood test strip Use as instructed FOR TESTING three times daily.  E11.9     ipratropium-albuterol (DUONEB) 0.5-2.5 (3) MG/3ML SOLN Inhale into the lungs.     losartan (COZAAR) 50 MG tablet Take 50 mg by mouth daily.     metFORMIN (GLUCOPHAGE) 500 MG tablet Take 500 mg by mouth daily with breakfast.     mirtazapine (REMERON) 15 MG tablet Take 15 mg by mouth at bedtime.     OZEMPIC, 1 MG/DOSE, 2 MG/1.5ML SOPN Inject 1 mg into the skin once a week.     Risankizumab-rzaa (SKYRIZI) 150 MG/ML SOSY Inject into the skin.     ROCKLATAN 0.02-0.005 % SOLN Apply 1 drop to eye at bedtime.     sotalol (BETAPACE) 80 MG tablet SMARTSIG:1 Tablet(s) By Mouth Every 12 Hours     SOTALOL AF 80 MG TABS Take by mouth 2 (two) times daily. Breakfast and dinner  tiZANidine (ZANAFLEX) 2 MG tablet Take 2 mg by mouth 3 (three) times daily.     TRELEGY ELLIPTA 100-62.5-25 MCG/ACT AEPB Take 1 puff by mouth daily.     triamcinolone cream (KENALOG) 0.1 % Apply 1 Application topically 2 (two) times daily.     TURMERIC PO Take by mouth daily.     warfarin (COUMADIN) 5 MG tablet Take 5 mg by  mouth daily.     hydrochlorothiazide (HYDRODIURIL) 12.5 MG tablet Take 1 tablet (12.5 mg total) by mouth daily. 30 tablet 0   No current facility-administered medications for this visit.    Musculoskeletal: Strength & Muscle Tone: within normal limits Gait & Station: normal Patient leans: N/A  Psychiatric Specialty Exam: Review of Systems  Psychiatric/Behavioral:  Positive for decreased concentration, dysphoric mood and sleep disturbance. Negative for agitation, behavioral problems, confusion, hallucinations, self-injury and suicidal ideas. The patient is nervous/anxious. The patient is not hyperactive.   All other systems reviewed and are negative.   Blood pressure 111/76, pulse 76, temperature 97.9 F (36.6 C), temperature source Temporal, height '5\' 10"'  (1.778 m), weight 235 lb (106.6 kg), SpO2 91 %.Body mass index is 33.72 kg/m.  General Appearance: Fairly Groomed  Eye Contact:  Good  Speech:  Clear and Coherent  Volume:  Normal  Mood:  Depressed  Affect:  Appropriate, Congruent, and initially down, but later smiles  Thought Process:  Coherent  Orientation:  Full (Time, Place, and Person)  Thought Content:  Logical  Suicidal Thoughts:  No  Homicidal Thoughts:  No  Memory:  Immediate;   Good  Judgement:  Good  Insight:  Good  Psychomotor Activity:  Normal  Concentration:  Concentration: Good and Attention Span: Good  Recall:  Good  Fund of Knowledge:Good  Language: Good  Akathisia:  No  Handed:  Right  AIMS (if indicated):  not done  Assets:  Communication Skills Desire for Improvement  ADL's:  Intact  Cognition: WNL  Sleep:  Poor   Screenings: PHQ2-9    [facility] Office Visit from 02/02/2022 in [facility] Office Visit from 12/08/2021 in [facility] from 12/31/2019 in [facility] Pulmonary Rehab from 11/26/2019 in [facility] Pulmonary  Rehab from [name] in [facility]  PHQ-2 Total Score '3 4 3 3 1  ' PHQ-9 Total Score '11 18 12 8 4      ' Flowsheet Row ED from 11/28/2021 in [facility]  ED from 11/18/2021 in [facility] from 09/28/2021 in [facility] [place] No Risk No Risk No Risk       Assessment and Plan:  Brylan Dec is a 76 y.o. year old male with a history of depression, COPD, CHF, diabetes, diabetic neuropathy, hypertension, pulmonnary emplosim. 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   1. MDD (major depressive disorder), recurrent episode, moderate ([place]) He reports depressive symptoms in the context of demoraizatoin secondary to exacerbation in his back pain, which he attributes to sciatica and COPD.  Other psychosocial stressors includes retirement, relocation from [place].  He reports great relationship/support from his girlfriend of several years.  Will continue current medication regimen given bupropion has recently been started by his provider.  Will continue duloxetine to target depression; may consider uptitration of this medication  in the future after confirming his renal function.  Will continue mirtazapine at this time to target depression, and satiety and insomnia.  He will greatly benefit from CBT/supportive therapy; will make referral. Obtain lab (TSH) to rule out medical issue contributing to his mood symptoms.   # AKI According to the chart review, there was worsening in creatinine.  He was advised to contact his PCP regarding this blot test result.   Plan Continue duloxetine 60 mg daily - maximum dose at his GFR Continue bupropion 100 mg twice a day (started about a month ago) Continue mirtazapine 15 mg at night Obtain lab (TSH)  Referral to therapy Next appointment 11/28 at  4:30, in person - on gabapentin 800 mg three times a day. - on Ozempic  The patient demonstrates the following risk factors for suicide: Chronic risk factors for suicide include: psychiatric disorder of depression . Acute risk factors for suicide include: unemployment and loss (financial, interpersonal, professional). Protective factors for this patient include: positive social support, coping skills, and hope for the future. Considering these factors, the overall suicide risk at this point appears to be low. Patient is appropriate for outpatient follow up.     Collaboration of Care: Other reviewed notes in Epic  Patient/Guardian was advised Release of Information must be obtained prior to any record release in order to collaborate their care with an outside provider. Patient/Guardian was advised if they have not already done so to contact the registration department to sign all necessary forms in order for [place] to release information regarding their care.   Consent: Patient/Guardian gives verbal consent for treatment and assignment of benefits for services provided during this visit. Patient/Guardian expressed understanding and agreed to proceed.   Norman Clay, MD 10/5/20232:54 PM

## 2022-02-01 ENCOUNTER — Ambulatory Visit: Admit: 2022-02-01 | Discharge: 2022-02-02 | Payer: MEDICARE | Attending: "Endocrinology | Primary: "Endocrinology

## 2022-02-01 ENCOUNTER — Ambulatory Visit: Payer: Medicare Other | Attending: Physical Medicine & Rehabilitation

## 2022-02-01 DIAGNOSIS — I429 Cardiomyopathy, unspecified: Principal | ICD-10-CM

## 2022-02-01 DIAGNOSIS — N1831 Stage 3a chronic kidney disease (CMS-HCC): Principal | ICD-10-CM

## 2022-02-01 DIAGNOSIS — E1142 Type 2 diabetes mellitus with diabetic polyneuropathy: Principal | ICD-10-CM

## 2022-02-01 DIAGNOSIS — E785 Hyperlipidemia, unspecified: Principal | ICD-10-CM

## 2022-02-01 DIAGNOSIS — E1169 Type 2 diabetes mellitus with other specified complication: Principal | ICD-10-CM

## 2022-02-01 DIAGNOSIS — R269 Unspecified abnormalities of gait and mobility: Secondary | ICD-10-CM | POA: Diagnosis present

## 2022-02-01 DIAGNOSIS — M5459 Other low back pain: Secondary | ICD-10-CM | POA: Diagnosis present

## 2022-02-01 DIAGNOSIS — R262 Difficulty in walking, not elsewhere classified: Secondary | ICD-10-CM | POA: Diagnosis present

## 2022-02-01 DIAGNOSIS — M6281 Muscle weakness (generalized): Secondary | ICD-10-CM | POA: Insufficient documentation

## 2022-02-01 MED ORDER — GABAPENTIN 800 MG TABLET
ORAL_TABLET | Freq: Three times a day (TID) | ORAL | 0 refills | 90 days | Status: CP
Start: 2022-02-01 — End: ?

## 2022-02-01 MED ORDER — EDOXABAN 60 MG TABLET
ORAL_TABLET | Freq: Every day | ORAL | 3 refills | 90 days | Status: CN
Start: 2022-02-01 — End: 2023-02-01

## 2022-02-01 NOTE — Therapy (Signed)
OUTPATIENT PHYSICAL THERAPY TREATMENT NOTE      MRN: [id number] Patient Name: Mario [name] DOB:[birth year], 76 y.o., male Today's Date: 02/01/2022   PT End of Session - 02/01/22 0750     Visit Number 14    Number of Visits 16    Date for PT Re-Evaluation 03/13/22    Progress Note Due on Visit 20    PT Start Time 0756    PT Stop Time 0838    PT Time Calculation (min) 42 min    Activity Tolerance Patient tolerated treatment well;No increased pain    Behavior During Therapy WFL for tasks assessed/performed                   Past Medical History:  Diagnosis Date   Arthritis    hands, knees   Atrial fibrillation (HCC)    CHF (congestive heart failure) (HCC)    COPD (chronic obstructive pulmonary disease) (HCC)    Diabetes mellitus without complication (HCC)    Diabetic neuropathy (HCC)    Heart murmur    in past, mild 1969   Hypertension    resolved after weight loss and more activity   Neuromuscular disorder ([place])    neuropathy   Neuropathy    Pulmonary embolism ([place])    approx 2014   Shingles '99   Sleep apnea    CPAP   Wears dentures    full upper and lower   Past Surgical History:  Procedure Laterality Date   CATARACT EXTRACTION W/PHACO Left 05/12/2019   Procedure: CATARACT EXTRACTION PHACO AND INTRAOCULAR LENS PLACEMENT ([place]) LEFTDIABETIC ISTENT INJ 2.43  00:28.6;  Surgeon: Eulogio Bear, MD;  Location: [facility];  Service: Ophthalmology;  Laterality: Left;  Diabetic - oral meds sleep apnea   CATARACT EXTRACTION W/PHACO Right 06/02/2019   Procedure: CATARACT EXTRACTION PHACO AND INTRAOCULAR LENS PLACEMENT (IOC) ISTENT INJ  RIGHT DIABETIC 1.31  00:25.4;  Surgeon: Eulogio Bear, MD;  Location: [facility];  Service: Ophthalmology;  Laterality: Right;   COLONOSCOPY  08/30/2015   FINGER SURGERY Right    3'rd finger, right hand   KNEE SURGERY Right    screw in knee   WRIST SURGERY Left    metal removed   Patient Active  Problem List   Diagnosis Date Noted   Spinal stenosis, lumbar region, with neurogenic claudication 10/06/2021   Sacroiliac joint pain 10/06/2021   Piriformis muscle pain 10/06/2021   Bilateral primary osteoarthritis of knee 06/11/2019   Chronic radicular lumbar pain 06/11/2019   Cervical facet joint syndrome 06/11/2019   Lumbar facet arthropathy 06/11/2019   Lumbar radiculopathy 06/11/2019   Chronic pain syndrome 06/11/2019   Lung nodule 02/09/2016   Exercise hypoxemia 12/28/2015   Chronic obstructive pulmonary disease ([place]) 03/16/2015   Nuclear sclerosis 03/16/2015   OSA (obstructive sleep apnea) 03/16/2015   Primary open angle glaucoma of both eyes, severe stage 03/16/2015   Type 2 diabetes mellitus without complication, without long-term current use of insulin ([place]) 03/16/2015   Arthralgia of left lower leg 10/29/2014   Osteoarthritis of left knee 09/10/2014   Type 2 diabetes mellitus with diabetic neuropathy ([place]) 08/04/2014   Cataract, nuclear sclerotic senile 04/01/2014   Combined forms of age-related cataract 04/01/2014   Keratitis sicca, bilateral ([place]) 04/01/2014   Primary open angle glaucoma 02/27/2014   Flat feet 01/29/2014   Foot pain, bilateral 01/29/2014   Tinea pedis of both feet 01/29/2014   Ptosis of both eyelids 11/25/2013   Type 2  diabetes mellitus without retinopathy ([place]) 11/25/2013   Onychomycosis 09/60/4540   Chronic systolic heart failure ([place]) 11/11/2013   Emphysema of lung ([place]) 11/11/2013   Essential hypertension 11/11/2013   Screen for colon cancer 11/05/2013   Cardiomyopathy ([place]) 98/03/9146   Systolic HF (heart failure) ([place]) 82/95/6213   Acute systolic CHF (congestive heart failure) ([place]) 09/16/2013   COPD (chronic obstructive pulmonary disease) ([place]) 09/16/2013   HTN (hypertension) 09/16/2013   Obesity 09/16/2013   PAF (paroxysmal atrial fibrillation) ([place]) 09/16/2013   Senile nuclear sclerosis 05/22/2013   Muscle weakness (generalized)  11/11/2009   Long term current use of anticoagulant therapy 06/17/2009   Pulmonary embolism ([place]) 06/17/2009   ED (erectile dysfunction) of organic origin 05/13/2003    PCP: Delma Freeze MD  REFERRING PROVIDER: Girtha Hake I MD   REFERRING DIAG: back and LE pain   Rationale for Evaluation and Treatment Rehabilitation  THERAPY DIAG:  Other low back pain  Muscle weakness (generalized)  Difficulty in walking, not elsewhere classified  Abnormality of gait and mobility  ONSET DATE: about 3 months ago.   SUBJECTIVE:                                                                                                                                                                                           SUBJECTIVE STATEMENT: Pt reports his breathing has been more of an issue lately and he is going to discuss with MD about returning to Cardiopulmonary rehab.    PERTINENT HISTORY:  Patient presents to physical therapy for low back and LE pain. PMH includes: arthritis, a fib, CHF, COPD, DM, neuropathy, heart murmur, HTN, PE, shingles, spinal stenosis, SI pain, bilateral OA of knees, lumbar facet arthropathy, lumbar radiculopathy, chronic pain syndrome, lung nodule, exercise hypoxemia, glaucoma of bilateral eyes, flat feet, onychomycosis, PAF. Per patient notes patient was recommended surgery by orthopedics but patient declined. Since patient was seen last in this clinic he has been in the ED three times. Patient reports his sciatic pain has been worsening in the past three months.   PAIN:  Are you having pain? No: NPRS scale: 4/10 Pain location: low back, buttock Pain description: radiating Aggravating factors: walking, moving Relieving factors: rest   PRECAUTIONS: Fall  WEIGHT BEARING RESTRICTIONS No    OCCUPATION: retired  PLOF: Independent with basic ADLs  PATIENT GOALS to have less pain and be able to walk again    TODAY'S TREATMENT  Therex: (moist heat while  performing supine therex)   Transverse TA contraction- hold 5 sec x 10 reps (VC for correct form)  TA contraction with Hip add ball squeeze -  hold 5 sec x 10 reps (Reminders to count out loud)  Lower trunk rotation x 20 reps each direction  TA contraction with B Hip abd (butterfly) with GTB x 10 reps (patient reported he could feel his cheeks tightening but no pain) Piriformis stretch (figure 4) with LE on green theraball x 5 with 30 sec hold each side Sidelye Hip ER (Clamshell) with TA contraction and use of GTB x 12 reps each side Resistive Knee to chest in supine with GTB- TA contraction x 10 reps   Standing in // bars- Step up alt LE with TA contraction x 12 reps each Walking in // bars- forward/retro - VC to stand erect and take longer steps as able- No pain reported- O2 sat= 92% on 3L Side stepping down and back in // bars with BUE support x 3 (patient reported fatigue yet no pain)      PATIENT EDUCATION:  Education details: Pt educated throughout session about proper posture and technique with exercises. Improved exercise technique, movement at target joints, use of target muscles after min to mod verbal, visual, tactile cues.  Person educated: Patient Education method: Explanation, Demonstration, Tactile cues, and Verbal cues Education comprehension: verbalized understanding, returned demonstration, verbal cues required, tactile cues required, and needs further education   HOME EXERCISE PROGRAM:   No updates 11/14/2021 Access Code: [email or web address] URL: [email or web address] Date: 10/24/2021 Prepared by: Janna Arch  Exercises - Supine Lower Trunk Rotation  - 1 x daily - 7 x weekly - 2 sets - 10 reps - 5 hold - Supine Posterior Pelvic Tilt  - 1 x daily - 7 x weekly - 2 sets - 10 reps - 5 hold - Supine Figure 4 Piriformis Stretch  - 1 x daily - 7 x weekly - 2 sets - 10 reps - 5 hold - Seated Hamstring Stretch  - 1 x daily - 7 x weekly - 2 sets - 2 reps - 30  hold  ASSESSMENT:  CLINICAL IMPRESSION: Patient responded favorably to continued core stabilization exercises without pain and no significant breathing difficulty. He was able to progress more with standing while continuing to work his core. He was able to follow all cues for correct technique. The patient will benefit from skilled physical therapy to decrease pain, improve mobility, and increase strength for improved quality of life.    OBJECTIVE IMPAIRMENTS Abnormal gait, cardiopulmonary status limiting activity, decreased activity tolerance, decreased balance, decreased coordination, decreased endurance, decreased mobility, difficulty walking, decreased ROM, decreased strength, hypomobility, impaired perceived functional ability, increased muscle spasms, impaired flexibility, improper body mechanics, postural dysfunction, and pain.   ACTIVITY LIMITATIONS carrying, lifting, bending, sitting, standing, squatting, sleeping, stairs, transfers, bed mobility, bathing, toileting, dressing, reach over head, hygiene/grooming, and locomotion level  PARTICIPATION LIMITATIONS: meal prep, cleaning, laundry, driving, shopping, community activity, and yard work  PERSONAL FACTORS Age, Fitness, Past/current experiences, Time since onset of injury/illness/exacerbation, Transportation, and 3+ comorbidities: arthritis, a fib, CHF, COPD, DM, neuropathy, heart murmur, HTN, PE, shingles, spinal stenosis, SI pain, bilateral OA of knees, lumbar facet arthropathy, lumbar radiculopathy, chronic pain syndrome, lung nodule, exercise hypoxemia, glaucoma of bilateral eyes, flat feet, onychomycosis, PAF  are also affecting patient's functional outcome.   REHAB POTENTIAL: Fair    CLINICAL DECISION MAKING: Evolving/moderate complexity  EVALUATION COMPLEXITY: Moderate   GOALS: Goals reviewed with patient? Yes  SHORT TERM GOALS: Target date: 11/21/2021  Patient will be independent in home exercise program to improve  strength/mobility for better functional independence with ADLs. Baseline: 6/26:  Hep given; 01/11/2022= patient reports doing well with stretches at home and no questions about technique  Goal status: GOAL MET  2.  Patient will report a worst pain of 5/10 on VAS in   back and LE's to improve tolerance with ADLs and reduced symptoms with activities.  Baseline: 6/26: 10/10; 01/03/2022= Pain is worse since fall this past weekend= 7/10; Goal status: GOAL MET   LONG TERM GOALS: Target date: 03/13/2022  Patient will increase FOTO score to equal to or greater than  53   to demonstrate statistically significant improvement in mobility and quality of life.  Baseline: 6/26: 41%:  Goal status: INITIAL  2.  Patient will report a worst pain of 3/10 on VAS in  low back and LEs  to improve tolerance with ADLs and reduced symptoms with activities.  Baseline:6/26: 10/10;  01/03/2022= Pain is worse since fall this past weekend= 7/10.  01/11/2022= Patient currently denies pain since injection on Monday Goal status: ONGOING  3.  Patient (> 6 years old) will complete five times sit to stand test in < 15 seconds indicating an increased LE strength and improved balance. Baseline: 6/26: 21.56 seconds with heavy BUE support; 01/11/2022= 12.58 sec without UE support.  Goal status: Will keep goal active to ensure patient able to perform consistently  4.   Patient will reduce modified Oswestry score to <20 as to demonstrate minimal disability with ADLs including improved sleeping tolerance, walking/sitting tolerance etc for better mobility with ADLs Baseline: 6/26: 60%;  Goal status:   5.  Patient will increase 10 meter walk test to >1.79ms as to improve gait speed for better community ambulation and to reduce fall risk. Baseline: 6/26: unable to ambulate distance; 01/11/2022= 0.72 m/s without an AD Goal status: ONGOING    PLAN: PT FREQUENCY: 2x/week  PT DURATION: 8 weeks  PLANNED INTERVENTIONS: Therapeutic  exercises, Therapeutic activity, Neuromuscular re-education, Balance training, Gait training, Patient/Family education, Joint manipulation, Joint mobilization, Stair training, DME instructions, Dry Needling, Spinal mobilization, Cryotherapy, Moist heat, Taping, Traction, Ultrasound, Ionotophoresis 429mml Dexamethasone, and Manual therapy.    9:43 AM, 02/01/22 JeOllen BowlPT Physical Therapist - [phone]    02/01/2022, 9:43 AM

## 2022-02-01 NOTE — Unmapped (Signed)
Patient requesting refill of medication prescribed by outside provider:   Requested Prescriptions     Pending Prescriptions Disp Refills    gabapentin (NEURONTIN) 800 MG tablet 270 tablet 0     Sig: Take 1 tablet (800 mg total) by mouth Three (3) times a day.       Medication originally prescribed by:  Name of Provider: Historical Provider

## 2022-02-01 NOTE — Unmapped (Signed)
Excellent A1c and sugar control on review today is good. Will continue same regimen [id number], [id number], metformin 1g bid (this one with caution for reduction if renal deteriorates over time). Cautioned to remain hydrated and dietary/lifestyle counselling and any barriers reviewed    If he remains well he can return for reassessment of his diabetes management in 6 months. He may choose to followup with his PCP for this down the road if continues stable and I will leave that decision to them. We will book a 6 mo fu

## 2022-02-01 NOTE — Unmapped (Signed)
Patient arrived to clinic today for his routine endocrine follow up. Patient requested refills of gabapentin and Savaysa. Reports that he spoke with him insurance and has a new plan that now covers the [facility]. Please send in refills if appropriate.

## 2022-02-01 NOTE — Unmapped (Signed)
Thank you for choosing [facility] Endocrinology with Dr. Amalia Hailey for your medical care!  If you have any questions about your appointment today, please call our office to speak with our nurse.  Speak With a Nurse   [facility] Endocrinology at [facility]  [phone] ext. [phone] [facility] Endocrinology at [facility]  [phone] ext. [phone]     [place] MyChart message replies can take 3 business days. [facility] MyChart should not be used for urgent or emergent medical needs. [place] MyChart should not be used for new problems or multiple concerns. If you have multiple concerns, please call our office to schedule an appointment. [place] MyChart is not checked on nights, weekends, or holidays.    Schedule an Appointment   [facility] Endocrinology at [facility]  [phone]. [phone] [place] [facility] at [facility]  [phone] ext. [phone]     If you are experiencing a medical emergency, please call 911 or proceed to your nearest emergency room.     If you have billing questions, please contact [facility] Billing Department at [phone] Monday - Thursday 8:00AM - 5:00PM and Friday 8:00AM - 11:30AM. For confidentiality purposes, you will need your [facility] account number and other patient identifying information before they can discuss your account with you.

## 2022-02-01 NOTE — Unmapped (Signed)
Stable

## 2022-02-02 ENCOUNTER — Encounter: Payer: Self-pay | Admitting: Psychiatry

## 2022-02-02 ENCOUNTER — Ambulatory Visit (INDEPENDENT_AMBULATORY_CARE_PROVIDER_SITE_OTHER): Payer: 59 | Admitting: Psychiatry

## 2022-02-02 VITALS — BP 111/76 | HR 76 | Temp 97.9°F | Ht 70.0 in | Wt 235.0 lb

## 2022-02-02 DIAGNOSIS — F331 Major depressive disorder, recurrent, moderate: Secondary | ICD-10-CM

## 2022-02-02 NOTE — Patient Instructions (Signed)
Continue duloxetine 60 mg daily  Continue bupropion 100 mg twice a day Continue mirtazapine 15 mg at night Obtain lab (TSH)  Referral to therapy Next appointment 11/28 at 4:30

## 2022-02-02 NOTE — Unmapped (Signed)
Patient contacted and informed that refills have been sent, patient did verbalize understanding and had no further questions at this time.

## 2022-02-03 ENCOUNTER — Ambulatory Visit (INDEPENDENT_AMBULATORY_CARE_PROVIDER_SITE_OTHER): Payer: 59 | Admitting: Podiatry

## 2022-02-03 DIAGNOSIS — L97522 Non-pressure chronic ulcer of other part of left foot with fat layer exposed: Secondary | ICD-10-CM

## 2022-02-03 DIAGNOSIS — E0843 Diabetes mellitus due to underlying condition with diabetic autonomic (poly)neuropathy: Secondary | ICD-10-CM | POA: Diagnosis not present

## 2022-02-03 NOTE — Progress Notes (Signed)
Chief Complaint  Patient presents with   wound care    Patient is here for wound care for left foot top of foot ulcer.    HPI: 76 y.o. male PMHx diabetes mellitus presenting today for follow-up evaluation of an ulcer to the left foot.  He has been applying the antibiotic cream as instructed.  No new complaints at this time  Past Medical History:  Diagnosis Date   Arthritis    hands, knees   Atrial fibrillation (HCC)    CHF (congestive heart failure) (HCC)    COPD (chronic obstructive pulmonary disease) (HCC)    Diabetes mellitus without complication (HCC)    Diabetic neuropathy (HCC)    Heart murmur    in past, mild 1969   Hypertension    resolved after weight loss and more activity   Neuromuscular disorder ([place])    neuropathy   Neuropathy    Pulmonary embolism ([place])    approx 2014   Shingles '99   Sleep apnea    CPAP   Wears dentures    full upper and lower    Past Surgical History:  Procedure Laterality Date   CATARACT EXTRACTION W/PHACO Left 05/12/2019   Procedure: CATARACT EXTRACTION PHACO AND INTRAOCULAR LENS PLACEMENT ([place]) LEFTDIABETIC ISTENT INJ 2.43  00:28.6;  Surgeon: Eulogio Bear, MD;  Location: [facility];  Service: Ophthalmology;  Laterality: Left;  Diabetic - oral meds sleep apnea   CATARACT EXTRACTION W/PHACO Right 06/02/2019   Procedure: CATARACT EXTRACTION PHACO AND INTRAOCULAR LENS PLACEMENT ([facility]) ISTENT INJ  RIGHT DIABETIC 1.31  00:25.4;  Surgeon: Eulogio Bear, MD;  Location: [facility];  Service: Ophthalmology;  Laterality: Right;   COLONOSCOPY  08/30/2015   FINGER SURGERY Right    3'rd finger, right hand   KNEE SURGERY Right    screw in knee   WRIST SURGERY Left    metal removed    Allergies  Allergen Reactions   Rosuvastatin Anaphylaxis    "felt bad"   Ace Inhibitors Anxiety   Lisinopril     "had me climbing walls"   Simbrinza [Brinzolamide-Brimonidine] Itching    And burning        Physical  Exam: General: The patient is alert and oriented x3 in no acute distress.  Dermatology: Ulcer noted to the dorsum of the left foot that measures approximately 1.0x1.0 from 0.1 cm.  After debridement there is a healthy granular wound base with some intermixed fibrous tissue.  No significant drainage.  Periwound is intact with pink borders.  No clinical indication of infection  Vascular: Palpable pedal pulses bilaterally. Capillary refill within normal limits.  Negative for any significant edema or erythema  Neurological: Light touch and protective threshold to noticed  Musculoskeletal Exam: No pedal deformities noted.  No prior amputations  Radiographic Exam LT foot 01/13/2022:  Normal osseous mineralization. Joint spaces preserved. No fracture/dislocation/boney destruction.    Assessment: 1.   Ulcer left foot with fat layer exposed 2.  Diabetes mellitus with peripheral polyneuropathy  Plan of Care:  1. Patient evaluated.  2.  Medically necessary excisional debridement including subcutaneous tissue was performed to the ulcer.  Excisional debridement of the necrotic nonviable tissue down to healthier bleeding viable tissue was performed with postdebridement measurement same as pre- 4.  Continue gentamicin cream with a light dressing daily 5.  Continue diabetic shoes and insoles 6.  Return to clinic in 4 weeks     Mario [name], [place]  Dr. Edrick [name], DPM    [address], [place] [address]                Office [phone]  Fax [phone]

## 2022-02-06 ENCOUNTER — Ambulatory Visit
Payer: Medicare Other | Attending: Student in an Organized Health Care Education/Training Program | Admitting: Student in an Organized Health Care Education/Training Program

## 2022-02-06 ENCOUNTER — Ambulatory Visit
Admission: RE | Admit: 2022-02-06 | Discharge: 2022-02-06 | Disposition: A | Discharge status: Home / Self Care | Payer: Medicare Other | Source: Ambulatory Visit | Attending: Student in an Organized Health Care Education/Training Program | Admitting: Student in an Organized Health Care Education/Training Program

## 2022-02-06 ENCOUNTER — Encounter: Payer: Self-pay | Admitting: Student in an Organized Health Care Education/Training Program

## 2022-02-06 ENCOUNTER — Ambulatory Visit: Payer: Medicare Other

## 2022-02-06 ENCOUNTER — Other Ambulatory Visit: Payer: Self-pay

## 2022-02-06 VITALS — BP 120/84 | HR 96 | Temp 97.2°F | Resp 13 | Ht 70.0 in | Wt 231.0 lb

## 2022-02-06 DIAGNOSIS — M170 Bilateral primary osteoarthritis of knee: Secondary | ICD-10-CM | POA: Diagnosis present

## 2022-02-06 MED ORDER — LIDOCAINE HCL 2 % IJ SOLN
20.0000 mL | Freq: Once | INTRAMUSCULAR | Status: AC
  Administered 2022-02-06: 400 mg
  Filled 2022-02-06: qty 20

## 2022-02-06 MED ORDER — ROPIVACAINE HCL 2 MG/ML IJ SOLN
9.0000 mL | Freq: Once | INTRAMUSCULAR | Status: AC
  Administered 2022-02-06: 9 mL via PERINEURAL

## 2022-02-06 MED ORDER — DEXAMETHASONE SODIUM PHOSPHATE 10 MG/ML IJ SOLN
10.0000 mg | Freq: Once | INTRAMUSCULAR | Status: AC
  Administered 2022-02-06: 10 mg
  Filled 2022-02-06: qty 1

## 2022-02-06 MED ORDER — ROPIVACAINE HCL 2 MG/ML IJ SOLN
9.0000 mL | Freq: Once | INTRAMUSCULAR | Status: AC
  Administered 2022-02-06: 9 mL via PERINEURAL
  Filled 2022-02-06: qty 20

## 2022-02-06 NOTE — Progress Notes (Signed)
Safety precautions to be maintained throughout the outpatient stay will include: orient to surroundings, keep bed in low position, maintain call bell within reach at all times, provide assistance with transfer out of bed and ambulation.  

## 2022-02-06 NOTE — Progress Notes (Signed)
PROVIDER NOTE: Interpretation of information contained herein should be left to medically-trained personnel. Specific patient instructions are provided elsewhere under "Patient Instructions" section of medical record. This document was created in part using STT-dictation technology, any transcriptional errors that may result from this process are unintentional.  Patient: Mario [name] Type: Established DOB: Apr 13, 1946 MRN: [id number] PCP: Maree ErieMullen, Patrick S, MD  Service: Procedure DOS: 02/06/2022 Setting: Ambulatory Location: Ambulatory outpatient facility Delivery: Face-to-face Provider: Edward JollyBilal Mckinley Olheiser, MD Specialty: Interventional Pain Management Specialty designation: 09 Location: Outpatient facility Ref. Prov.: Maree ErieMullen, Patrick S, MD    Primary Reason for Visit: Interventional Pain Management Treatment. CC: Knee Pain (Bilateral )   Procedure:               Type: Genicular Nerves Block (Superolateral, Superomedial, and Inferomedial Genicular Nerves)  #2  Laterality: Bilateral (-50)  Level: Superior and inferior to the knee joint.  Imaging: Fluoroscopic guidance Anesthesia: Local anesthesia (1-2% Lidocaine) DOS: 02/06/2022  Performed by: Edward JollyBilal Desjuan Stearns, MD  Purpose: Diagnostic/Therapeutic Indications: Chronic knee pain severe enough to impact quality of life or function. Rationale (medical necessity): procedure needed and proper for the diagnosis and/or treatment of [name]'s medical symptoms and needs. 1. Bilateral primary osteoarthritis of knee    NAS-11 Pain score:   Pre-procedure: 8 /10   Post-procedure: 0-No pain/10     Target: For Genicular Nerve block(s), the targets are: the superolateral genicular nerve, located in the lateral distal portion of the femoral shaft as it curves to form the lateral epicondyle, in the region of the distal femoral metaphysis; the superomedial genicular nerve, located in the medial distal portion of the femoral shaft as it curves to form the medial  epicondyle; and the inferomedial genicular nerve, located in the medial, proximal portion of the tibial shaft, as it curves to form the medial epicondyle, in the region of the proximal tibial metaphysis.  Location: Superolateral, Superomedial, and Inferomedial aspects of knee joint.  Region: Lateral, Anterior, and Medial aspects of the knee joint, above and below the knee joint proper. Approach: Percutaneous  Type of procedure: Percutaneous perineural nerve block. The genicular nerve block is a motor-sparing technique that anesthetizes the sensory terminal branches innervating the knee joint, resulting in anesthesia of the anterior compartment of the knee. The distribution of anesthesia of each nerve is mostly in the corresponding quadrant.  Neuroanatomy: The superolateral genicular nerve (SLGN) courses around the femur shaft to pass between the vastus lateralis and the lateral epicondyle. It accompanies the superior lateral genicular artery. The superomedial genicular nerve (SMGN) courses around the femur shaft, following the superior medial genicular artery, to pass between the adductor magnus tendon and the medial epicondyle below the vastus medialis. The inferolateral genicular nerve (ILGN) courses around the tibial lateral epicondyle deep to the lateral collateral ligament, following the inferior lateral genicular artery, superior of the fibula head. The inferomedial genicular nerve (IMGN) courses horizontally below the medial collateral ligament between the tibial medial epicondyle and the insertion of the collateral ligament. It accompanies the inferior medial genicular artery. The recurrent peroneal nerve originates in the inferior popliteal region from the common peroneal nerve and courses horizontally around the fibula to pass just inferior of the fibula head and travel superior to the anterolateral tibial epicondyle. It accompanies the recurrent tibial artery.  Position / Prep / Materials:   Position: Supine, Modified Fowler's position with pillows under the targeted knee(s). The patient is placed in a supine position with the knee slightly flexed by placing a pillow in  the popliteal fossa. Prep solution: DuraPrep (Iodine Povacrylex [0.7% available iodine] and Isopropyl Alcohol, 74% w/w) Prep Area: Entire knee area, from mid-thigh to mid-shin, lateral, anterior, and medial aspects. Materials:  Tray: Block Needle(s):  Type: Spinal  Gauge (G): 22  Length: 3.5-in  Qty: 3  Pre-op H&P Assessment:  Mario [name] is a 76 y.o. (year old), male patient, seen today for interventional treatment. He  has a past surgical history that includes Wrist surgery (Left); Knee surgery (Right); Colonoscopy (08/30/2015); Cataract extraction w/PHACO (Left, 05/12/2019); Finger surgery (Right); and Cataract extraction w/PHACO (Right, 06/02/2019). Mario [name] has a current medication list which includes the following prescription(s): albuterol, apple cider vinegar, atorvastatin, bupropion, celecoxib, duloxetine, empagliflozin, furosemide, gabapentin, gentamicin cream, glucose blood, ipratropium-albuterol, losartan, metformin, mirtazapine, ozempic (1 mg/dose), skyrizi, rocklatan, sotalol, tizanidine, trelegy ellipta, triamcinolone cream, dorzolamide, edoxaban, hydrochlorothiazide, sotalol af, turmeric, and warfarin. His primarily concern today is the Knee Pain (Bilateral )  Initial Vital Signs:  Pulse/HCG Rate: 92  Temp: (!) 97.2 F (36.2 C) Resp: 16 BP: 92/60 SpO2: 99 % (2 liters)  BMI: Estimated body mass index is 33.15 kg/m as calculated from the following:   Height as of this encounter: 5\' 10"  (1.778 m).   Weight as of this encounter: 231 lb (104.8 kg).  Risk Assessment: Allergies: Reviewed. He is allergic to rosuvastatin, ace inhibitors, lisinopril, and simbrinza [brinzolamide-brimonidine].  Allergy Precautions: None required Coagulopathies: Reviewed. None identified.  Blood-thinner therapy: None at this  time Active Infection(s): Reviewed. None identified. Mario [name] is afebrile  Site Confirmation: Mr. Copenhaver was asked to confirm the procedure and laterality before marking the site Procedure checklist: Completed Consent: Before the procedure and under the influence of no sedative(s), amnesic(s), or anxiolytics, the patient was informed of the treatment options, risks and possible complications. To fulfill our ethical and legal obligations, as recommended by the American Medical Association's Code of Ethics, I have informed the patient of my clinical impression; the nature and purpose of the treatment or procedure; the risks, benefits, and possible complications of the intervention; the alternatives, including doing nothing; the risk(s) and benefit(s) of the alternative treatment(s) or procedure(s); and the risk(s) and benefit(s) of doing nothing. The patient was provided information about the general risks and possible complications associated with the procedure. These may include, but are not limited to: failure to achieve desired goals, infection, bleeding, organ or nerve damage, allergic reactions, paralysis, and death. In addition, the patient was informed of those risks and complications associated to the procedure, such as failure to decrease pain; infection; bleeding; organ or nerve damage with subsequent damage to sensory, motor, and/or autonomic systems, resulting in permanent pain, numbness, and/or weakness of one or several areas of the body; allergic reactions; (i.e.: anaphylactic reaction); and/or death. Furthermore, the patient was informed of those risks and complications associated with the medications. These include, but are not limited to: allergic reactions (i.e.: anaphylactic or anaphylactoid reaction(s)); adrenal axis suppression; blood sugar elevation that in diabetics may result in ketoacidosis or comma; water retention that in patients with history of congestive heart failure may result  in shortness of breath, pulmonary edema, and decompensation with resultant heart failure; weight gain; swelling or edema; medication-induced neural toxicity; particulate matter embolism and blood vessel occlusion with resultant organ, and/or nervous system infarction; and/or aseptic necrosis of one or more joints. Finally, the patient was informed that Medicine is not an exact science; therefore, there is also the possibility of unforeseen or unpredictable risks and/or possible complications that may result  in a catastrophic outcome. The patient indicated having understood very clearly. We have given the patient no guarantees and we have made no promises. Enough time was given to the patient to ask questions, all of which were answered to the patient's satisfaction. Mr. Czaplicki has indicated that he wanted to continue with the procedure. Attestation: I, the ordering provider, attest that I have discussed with the patient the benefits, risks, side-effects, alternatives, likelihood of achieving goals, and potential problems during recovery for the procedure that I have provided informed consent. Date  Time: 02/06/2022 10:13 AM  Pre-Procedure Preparation:  Monitoring: As per clinic protocol. Respiration, ETCO2, SpO2, BP, heart rate and rhythm monitor placed and checked for adequate function Safety Precautions: Patient was assessed for positional comfort and pressure points before starting the procedure. Time-out: I initiated and conducted the "Time-out" before starting the procedure, as per protocol. The patient was asked to participate by confirming the accuracy of the "Time Out" information. Verification of the correct person, site, and procedure were performed and confirmed by me, the nursing staff, and the patient. "Time-out" conducted as per Joint Commission's Universal Protocol (UP.01.01.01). Time:    Description/Narrative of Procedure:          Rationale (medical necessity): procedure needed and proper  for the diagnosis and/or treatment of the patient's medical symptoms and needs. Procedural Technique Safety Precautions: Aspiration looking for blood return was conducted prior to all injections. At no point did we inject any substances, as a needle was being advanced. No attempts were made at seeking any paresthesias. Safe injection practices and needle disposal techniques used. Medications properly checked for expiration dates. SDV (single dose vial) medications used. Description of the Procedure: Protocol guidelines were followed. The patient was assisted into a comfortable position. The target area was identified and the area prepped in the usual manner. Skin & deeper tissues infiltrated with local anesthetic. Appropriate amount of time allowed to pass for local anesthetics to take effect. The procedure needles were then advanced to the target area. Proper needle placement secured. Negative aspiration confirmed. Solution injected in intermittent fashion, asking for systemic symptoms every 0.5cc of injectate. The needles were then removed and the area cleansed, making sure to leave some of the prepping solution back to take advantage of its long term bactericidal properties.   6 cc solution made of 5 cc of 0.2% ropivacaine, 1 cc of Decadron 10 mg/cc.  2 cc injected at each level above for the left knee. 6 cc solution made of 5 cc of 0.2% ropivacaine, 1 cc of Decadron 10 mg/cc.  2 cc injected at each level above for the right knee. Total steroid dose: 20 mg of Decadron            Vitals:   02/06/22 1015 02/06/22 1033 02/06/22 1043 02/06/22 1052  BP: 92/60 115/73 114/86 120/84  Pulse: 92 95 96   Resp: 16 15 13    Temp: (!) 97.2 F (36.2 C)     TempSrc: Temporal     SpO2: 99% 96% 97% 97%  Weight: 231 lb (104.8 kg)     Height: 5\' 10"  (1.778 m)        Start Time:   hrs. End Time: 1051 hrs.  Imaging Guidance (Non-Spinal):          Type of Imaging Technique: Fluoroscopy Guidance  (Non-Spinal) Indication(s): Assistance in needle guidance and placement for procedures requiring needle placement in or near specific anatomical locations not easily accessible without such assistance. Exposure Time:  Please see nurses notes. Contrast: None used. Fluoroscopic Guidance: I was personally present during the use of fluoroscopy. "Tunnel Vision Technique" used to obtain the best possible view of the target area. Parallax error corrected before commencing the procedure. "Direction-depth-direction" technique used to introduce the needle under continuous pulsed fluoroscopy. Once target was reached, antero-posterior, oblique, and lateral fluoroscopic projection used confirm needle placement in all planes. Images permanently stored in EMR. Interpretation: No contrast injected. I personally interpreted the imaging intraoperatively. Adequate needle placement confirmed in multiple planes. Permanent images saved into the patient's record.  Post-operative Assessment:  Post-procedure Vital Signs:  Pulse/HCG Rate: 96  Temp: (!) 97.2 F (36.2 C) Resp: 13 BP: 120/84 SpO2: 97 %  EBL: None  Complications: No immediate post-treatment complications observed by team, or reported by patient.  Note: The patient tolerated the entire procedure well. A repeat set of vitals were taken after the procedure and the patient was kept under observation following institutional policy, for this type of procedure. Post-procedural neurological assessment was performed, showing return to baseline, prior to discharge. The patient was provided with post-procedure discharge instructions, including a section on how to identify potential problems. Should any problems arise concerning this procedure, the patient was given instructions to immediately contact us, at any time, without hesitation. In any case, we plan to contact the patient by telephone for a follow-up status report regarding this interventional  procedure.  Comments:  No additional relevant information.  Plan of Care  Orders:  Orders Placed This Encounter  Procedures   DG PAIN CLINIC C-ARM 1-60 MIN NO REPORT    Intraoperative interpretation by procedural physician at [facility].    Standing Status:   Standing    Number of Occurrences:   1    Order Specific Question:   Reason for exam:    Answer:   Assistance in needle guidance and placement for procedures requiring needle placement in or near specific anatomical locations not easily accessible without such assistance.    Medications ordered for procedure: Meds ordered this encounter  Medications   lidocaine (XYLOCAINE) 2 % (with pres) injection 400 mg   dexamethasone (DECADRON) injection 10 mg   dexamethasone (DECADRON) injection 10 mg   ropivacaine (PF) 2 mg/mL (0.2%) (NAROPIN) injection 9 mL   ropivacaine (PF) 2 mg/mL (0.2%) (NAROPIN) injection 9 mL   Medications administered: We administered lidocaine, dexamethasone, dexamethasone, ropivacaine (PF) 2 mg/mL (0.2%), and ropivacaine (PF) 2 mg/mL (0.2%).  See the medical record for exact dosing, route, and time of administration.  Follow-up plan:   Return in about 4 weeks (around 03/06/2022) for Post Procedure Evaluation, in person.       Bilateral genicular nerve block, 06/25/2019 #1, R L4/5 ESI 10/26/21, 12/21/21, bilateral L3, L4, L5 medial branch nerve block 01/09/2022     Recent Visits Date Type Provider Dept  01/09/22 Procedure visit Edward Jolly, MD [facility]  12/29/21 Office Visit Edward Jolly, MD [facility]  12/21/21 Procedure visit Edward Jolly, MD [facility]  12/08/21 Office Visit Edward Jolly, MD [facility]  11/21/21 Office Visit Edward Jolly, MD [facility]  11/17/21 Office Visit Edward Jolly, MD [facility]  Showing recent visits within past 90 days and meeting all other requirements Today's Visits Date Type Provider Dept   02/06/22 Procedure visit Edward Jolly, MD [facility]  Showing today's visits and meeting all other requirements Future Appointments Date Type Provider Dept  03/06/22 Appointment Edward Jolly, MD [facility]  Showing future appointments within next 90 days and meeting all other requirements  Disposition: Discharge home  Discharge (Date  Time): 02/06/2022; 1057 hrs.   Primary Care Physician: Maree Erie, MD Location: [facility] Note by: Edward Jolly, MD Date: 02/06/2022; Time: 11:10 AM  Disclaimer:  Medicine is not an exact science. The only guarantee in medicine is that nothing is guaranteed. It is important to note that the decision to proceed with this intervention was based on the information collected from the patient. The Data and conclusions were drawn from the patient's questionnaire, the interview, and the physical examination. Because the information was provided in large part by the patient, it cannot be guaranteed that it has not been purposely or unconsciously manipulated. Every effort has been made to obtain as much relevant data as possible for this evaluation. It is important to note that the conclusions that lead to this procedure are derived in large part from the available data. Always take into account that the treatment will also be dependent on availability of resources and existing treatment guidelines, considered by other Pain Management Practitioners as being common knowledge and practice, at the time of the intervention. For Medico-Legal purposes, it is also important to point out that variation in procedural techniques and pharmacological choices are the acceptable norm. The indications, contraindications, technique, and results of the above procedure should only be interpreted and judged by a Board-Certified Interventional Pain Specialist with extensive familiarity and expertise in the same exact procedure and  technique.

## 2022-02-06 NOTE — Patient Instructions (Signed)

## 2022-02-07 ENCOUNTER — Telehealth: Payer: Self-pay

## 2022-02-07 NOTE — Telephone Encounter (Signed)
Post procedure phone call.  LM 

## 2022-02-08 ENCOUNTER — Ambulatory Visit: Payer: Medicare Other

## 2022-02-13 ENCOUNTER — Ambulatory Visit: Payer: Medicare Other

## 2022-02-15 ENCOUNTER — Ambulatory Visit: Payer: Medicare Other

## 2022-02-20 ENCOUNTER — Ambulatory Visit: Payer: Medicare Other

## 2022-02-22 ENCOUNTER — Ambulatory Visit: Payer: Medicare Other

## 2022-02-24 NOTE — Unmapped (Signed)
PA intiated for skyrizi Pen 150mg  via CMM  [id number]

## 2022-02-27 ENCOUNTER — Ambulatory Visit: Payer: Medicare Other

## 2022-02-27 NOTE — Unmapped (Signed)
PA Approved

## 2022-03-01 ENCOUNTER — Ambulatory Visit: Payer: Medicare Other

## 2022-03-03 ENCOUNTER — Encounter: Payer: Self-pay | Admitting: Podiatry

## 2022-03-03 ENCOUNTER — Ambulatory Visit (INDEPENDENT_AMBULATORY_CARE_PROVIDER_SITE_OTHER): Payer: Medicare Other | Admitting: Podiatry

## 2022-03-03 DIAGNOSIS — L97522 Non-pressure chronic ulcer of other part of left foot with fat layer exposed: Secondary | ICD-10-CM | POA: Diagnosis not present

## 2022-03-03 NOTE — Unmapped (Signed)
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   - Treated with: amlodipine (Norvasc) 5mg  and losartan (Cozaar) 50mg   as well as Sotalol for CHF/Afib. Will descraese amlodipine to 2.5mg  and recommend follow up with cardiology for repeat echo and sotalol decrease to prevent pre-syncope  - Instructed to measure home BP weekly and record; instructional handout given.  - Control dietary sodium and daily exercise.  - Follow up: 3 months           Relevant Medications    amlodipine (NORVASC) 2.5 MG tablet    Orthostatic hypotension     // Falls likely due to Orthostatic syncope  - BP today Blood Pressure for the past 24 hrs:   BP   03/06/22 0924 90/64   - Will order Cardiology follow up and Echocardiogram. BP changes as above  - Educated pt on likely diagnosis and importance of slow positional changes and hydration            HEALTH MAINTENANCE ITEMS STILL DUE:  Health Maintenance Due   Topic Date Due   ??? COPD Spirometry  Never done   ??? Medicare Annual Wellness Visit (AWV)  Never done   ??? Retinal Eye Exam  Never done   ??? Zoster Vaccines (1 of 2) Never done   ??? DTaP/Tdap/Td Vaccines (1 - Tdap) 10/31/2006   ??? AAA Screening  Never done           Subjective   Mr. Krogh is a 76 y.o. male  coming to clinic today for the following issues:    Chief Complaint   Patient presents with   ??? falls       HPI    Falls:   - Has had 5 falls. 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   - Only has split second to decide where to fall. Feel lightheaded or a second. Then blah. No pain. No tunnel vision or back out. NO heart palpitations.   - Can get dizzy when standing. Will wait for a minute.   - Would like to do rehab.     Sciatica. Not nearly as bad at is has been. Worse in R posterior. Can radiate in legs in AM. Wil need to lay in bed to get it to stop.     Hodges Treiber is a 76 y.o. male, with the following medical problems as documented above:      ROS    I have reviewed the problem list, medications, and allergies and have updated/reconciled them if needed.    Mr. Chrisley  reports that he quit smoking about 52 years ago. His smoking use included cigarettes. He smoked an average of .5 packs  per day. He has never used smokeless tobacco.    Objective     VITALS: BP 90/64  - Pulse 61  - Temp 36.6 ??C (97.9 ??F) (Oral)  - Resp 20  - SpO2 96%     Wt Readings from Last 6 Encounters:   11/30/21 (!) 107.5 kg (237 lb)   09/22/21 (!) 108 kg (238 lb)   09/14/21 (!) 107.5 kg (237 lb)   06/24/21 (!) 107.6 kg (237 lb 3.2 oz)        Physical Exam  Vitals reviewed.   Constitutional:       General: He is not in acute distress.     Appearance: Normal appearance. He is well-developed. He is not diaphoretic.   HENT:      Head: Normocephalic and atraumatic.      Right Ear: Hearing normal.      Left Ear: Hearing normal.      Nose: Nose normal.   Eyes:      General: No scleral icterus.        Right eye: No discharge.         Left eye: No discharge.      Conjunctiva/sclera: Conjunctivae normal.      Right eye: Right conjunctiva is not injected.      Left eye: Left conjunctiva is not injected.   Neck:      Thyroid: No thyroid mass.      Vascular: No carotid bruit or JVD.      Trachea: Trachea and phonation normal.   Cardiovascular:      Rate and Rhythm: Normal rate and regular rhythm. No extrasystoles are present.     Pulses: Normal pulses. Radial pulses are 2+ on the right side and 2+ on the left side.      Heart sounds: Murmur heard.   Pulmonary:      Effort: Pulmonary effort is normal. No respiratory distress.      Breath sounds: Wheezing present. No rales.      Comments: Oxygen 2L [place]    Musculoskeletal:         General: No deformity. Normal range of motion.      Cervical back: Normal range of motion.      Right lower leg: No edema.      Left lower leg: No edema.   Skin:     General: Skin is warm and dry.      Capillary Refill: Capillary refill takes less than 2 seconds.      Findings: Lesion and rash present. No bruising or erythema.      Nails: There is no clubbing.   Neurological:      Mental Status: He is alert and oriented to person, place, and time.      Cranial Nerves: No cranial nerve deficit.      Motor: Weakness present.      Gait: Gait normal.   Psychiatric:         Mood and Affect: Mood normal. Mood is not anxious.         Speech: Speech normal.         Behavior: Behavior normal.         Thought Content: Thought content normal.         Judgment: Judgment normal.     LABS/IMAGING  I have reviewed pertinent recent labs and imaging in Epic    Veatrice Kells, MD  [facility]  [facility]   [address]  [place], [place] [address] ??? Telephone [phone] ??? Fax [phone]

## 2022-03-06 ENCOUNTER — Ambulatory Visit: Admit: 2022-03-06 | Discharge: 2022-03-07 | Payer: MEDICARE

## 2022-03-06 ENCOUNTER — Encounter: Payer: Self-pay | Admitting: Student in an Organized Health Care Education/Training Program

## 2022-03-06 ENCOUNTER — Ambulatory Visit
Payer: Medicare Other | Attending: Student in an Organized Health Care Education/Training Program | Admitting: Student in an Organized Health Care Education/Training Program

## 2022-03-06 ENCOUNTER — Ambulatory Visit: Payer: Medicare Other

## 2022-03-06 VITALS — BP 132/105 | HR 78 | Temp 98.0°F | Resp 16 | Ht 70.0 in | Wt 230.0 lb

## 2022-03-06 DIAGNOSIS — I152 Hypertension secondary to endocrine disorders: Principal | ICD-10-CM

## 2022-03-06 DIAGNOSIS — I951 Orthostatic hypotension: Principal | ICD-10-CM

## 2022-03-06 DIAGNOSIS — J449 Chronic obstructive pulmonary disease, unspecified: Principal | ICD-10-CM

## 2022-03-06 DIAGNOSIS — E1159 Type 2 diabetes mellitus with other circulatory complications: Principal | ICD-10-CM

## 2022-03-06 DIAGNOSIS — M533 Sacrococcygeal disorders, not elsewhere classified: Secondary | ICD-10-CM | POA: Diagnosis present

## 2022-03-06 DIAGNOSIS — M7918 Myalgia, other site: Secondary | ICD-10-CM | POA: Diagnosis present

## 2022-03-06 DIAGNOSIS — G894 Chronic pain syndrome: Secondary | ICD-10-CM | POA: Diagnosis present

## 2022-03-06 NOTE — Progress Notes (Signed)
PROVIDER NOTE: Information contained herein reflects review and annotations entered in association with encounter. Interpretation of such information and data should be left to medically-trained personnel. Information provided to patient can be located elsewhere in the medical record under "Patient Instructions". Document created using STT-dictation technology, any transcriptional errors that may result from process are unintentional.    Patient: Mario [name]  Service Category: E/M  Provider: Gillis Santa, MD  DOB: December 16, 1945  DOS: 03/06/2022  Referring Provider: Delma Freeze, MD  MRN: [id number]  Specialty: Interventional Pain Management  PCP: Delma Freeze, MD  Type: Established Patient  Setting: Ambulatory outpatient    Location: Office  Delivery: Face-to-face     HPI  [name], a 76 y.o. year old male, is here today because of his Sacroiliac joint pain [M53.3]. [name] primary complain today is Back Pain (Bilateral buttocks) Last encounter: My last encounter with him was on 02/06/2022. Pertinent problems: [name] has Chronic radicular lumbar pain; Cervical facet joint syndrome; Lumbar facet arthropathy; Lumbar radiculopathy; Chronic pain syndrome; Spinal stenosis, lumbar region, with neurogenic claudication; Sacroiliac joint pain; and Piriformis muscle pain on their pertinent problem list. Pain Assessment: Severity of Chronic pain is reported as a 6 /10. Location: Back Right, Left/both legs. Onset: More than a month ago. Quality: Jabbing. Timing: Constant. Modifying factor(s): lying down. Vitals:  height is _0  (1.778 m) and weight is 230 lb (104.3 kg). His temporal temperature is 98 F (36.7 C). His blood pressure is 132/105 (abnormal) and his pulse is 78. His respiration is 16 and oxygen saturation is 95%.   Reason for encounter: post-procedure evaluation and assessment.    Post-procedure evaluation   Type: Genicular Nerves Block (Superolateral, Superomedial, and  Inferomedial Genicular Nerves)  #2  Laterality: Bilateral (-50)  Level: Superior and inferior to the knee joint.  Imaging: Fluoroscopic guidance Anesthesia: Local anesthesia (1-2% Lidocaine) DOS: 02/06/2022  Performed by: Gillis Santa, MD  Purpose: Diagnostic/Therapeutic Indications: Chronic knee pain severe enough to impact quality of life or function. Rationale (medical necessity): procedure needed and proper for the diagnosis and/or treatment of [name] medical symptoms and needs. 1. Bilateral primary osteoarthritis of knee    NAS-11 Pain score:   Pre-procedure: 8 /10   Post-procedure: 0-No pain/10      Effectiveness:  Initial hour after procedure: 100 %  Subsequent 4-6 hours post-procedure: 100 %  Analgesia past initial 6 hours: 90 %  Ongoing improvement:  Analgesic:  95% Function: [name] reports improvement in function ROM: [name] reports improvement in ROM   ROS  Constitutional: Denies any fever or chills Gastrointestinal: No reported hemesis, hematochezia, vomiting, or acute GI distress Musculoskeletal:  Improvement in bilateral knee pain, increase in bilateral SI joint and piriformis pain Neurological: No reported episodes of acute onset apraxia, aphasia, dysarthria, agnosia, amnesia, paralysis, loss of coordination, or loss of consciousness  Medication Review  Apple Cider Vinegar, DULoxetine, Fluticasone-Umeclidin-Vilant, Netarsudil-Latanoprost, Risankizumab-rzaa, SOTALOL AF, Semaglutide (1 MG/DOSE), Turmeric, albuterol, atorvastatin, buPROPion, celecoxib, edoxaban, empagliflozin, furosemide, gabapentin, gentamicin cream, glucose blood, hydrochlorothiazide, ipratropium-albuterol, losartan, metFORMIN, mirtazapine, sotalol, tiZANidine, and triamcinolone cream  History Review  Allergy: [name] is allergic to rosuvastatin, ace inhibitors, lisinopril, and simbrinza [brinzolamide-brimonidine]. Drug: [name]  reports no history of drug use. Alcohol:  reports no  history of alcohol use. Tobacco:  reports that he quit smoking about 52 years ago. His smoking use included cigarettes. He has a 0.50 pack-year smoking history. He has never used smokeless tobacco. Social: [name]  reports that he quit smoking about 52 years ago. His smoking use included cigarettes. He has a 0.50 pack-year smoking history. He has never used smokeless tobacco. He reports that he does not drink alcohol and does not use drugs. Medical:  has a past medical history of Arthritis, Atrial fibrillation ([place]), CHF (congestive heart failure) ([place]), COPD (chronic obstructive pulmonary disease) ([place]), Diabetes mellitus without complication ([place]), Diabetic neuropathy ([place]), Heart murmur, Hypertension, Neuromuscular disorder ([place]), Neuropathy, Pulmonary embolism ([place]), Shingles ('99), Sleep apnea, and Wears dentures. Surgical: [name]  has a past surgical history that includes Wrist surgery (Left); Knee surgery (Right); Colonoscopy (08/30/2015); Cataract extraction w/PHACO (Left, 05/12/2019); Finger surgery (Right); and Cataract extraction w/PHACO (Right, 06/02/2019). Family: family history is not on file.  Laboratory Chemistry Profile   Renal Lab Results  Component Value Date   BUN 22 06/07/2021   CREATININE 1.29 (H) 06/07/2021   BCR 20 02/15/2016   GFRAA 69 02/15/2016   GFRNONAA 58 (L) 06/07/2021    Hepatic Lab Results  Component Value Date   ALBUMIN 4.3 02/15/2016    Electrolytes Lab Results  Component Value Date   NA 138 06/07/2021   K 4.8 06/07/2021   CL 103 06/07/2021   CALCIUM 8.6 (L) 06/07/2021   PHOS 3.2 02/15/2016    Bone No results found for: "VD25OH", "VD125OH2TOT", "BD5789BO4", "RQ4128SK8", "25OHVITD1", "25OHVITD2", "25OHVITD3", "TESTOFREE", "TESTOSTERONE"  Inflammation (CRP: Acute Phase) (ESR: Chronic Phase) No results found for: "CRP", "ESRSEDRATE", "LATICACIDVEN"       Note: Above Lab results reviewed.   Physical Exam  General appearance: Well nourished, well  developed, and well hydrated. In no apparent acute distress Mental status: Alert, oriented x 3 (person, place, & time)       Respiratory: No evidence of acute respiratory distress Eyes: PERLA Vitals: BP (!) 132/105   Pulse 78   Temp 98 F (36.7 C) (Temporal)   Resp 16   Ht _0  (1.778 m)   Wt 230 lb (104.3 kg)   SpO2 95% Comment: O2 at 2 liters, BNC  BMI 33.00 kg/m  BMI: Estimated body mass index is 33 kg/m as calculated from the following:   Height as of this encounter: _1  (1.778 m).   Weight as of this encounter: 230 lb (104.3 kg). Ideal: Ideal body weight: 73 kg (160 lb 15 oz) Adjusted ideal body weight: 85.5 kg (188 lb 9 oz)  Lumbar Spine Area Exam  Skin & Axial Inspection: No masses, redness, or swelling Alignment: Symmetrical Functional ROM: Pain restricted ROM affecting both sides Stability: No instability detected Muscle Tone/Strength: Functionally intact. No obvious neuro-muscular anomalies detected. Sensory (Neurological): Dermatomal pain pattern pain referred pain from SI joint and piriformis Palpation: No palpable anomalies       Provocative Tests:  Patrick's Maneuver: (+) for bilateral S-I arthralgia             FABER* test:(+) for bilateral S-I arthralgia             S-I anterior distraction/compression test: (+) for bilateral S-I arthralgia             S-I lateral compression test: (+) for bilateral S-I arthralgia             S-I Thigh-thrust test:(+) for bilateral S-I arthralgia             S-I Gaenslen's test: (+) for bilateral S-I arthralgia               Assessment   Diagnosis Status  1. Sacroiliac joint pain   2. Piriformis muscle pain   3. Chronic pain syndrome    Persistent Persistent Persistent    Plan of Care  Excellent response from previous genicular nerve block.  Future considerations include genicular nerve RFA.  He is having increased bilateral SI joint and buttock pain consistent with SI joint arthropathy and piriformis  syndrome.  I offered him diagnostic SI joint injection and piriformis trigger point injection.  He states that he will think about this and call us if he would like to proceed.  As needed order placed below.   Orders:  Orders Placed This Encounter  Procedures   SACROILIAC JOINT INJECTION    Standing Status:   Standing    Number of Occurrences:   2    Standing Expiration Date:   09/04/2022    Scheduling Instructions:     Side: Bilateral     Sedation: up to pt     Timeframe: ASAP    Order Specific Question:   Where will this procedure be performed?    Answer:   [facility]   TRIGGER POINT INJECTION    Area: Buttocks region (gluteal area) Indications: Piriformis muscle pain;  B/L  piriformis-syndrome; piriformis muscle spasms (L84.536). CPT code: 20552    Standing Status:   Standing    Number of Occurrences:   2    Standing Expiration Date:   09/04/2022    Scheduling Instructions:     Type: Myoneural block (TPI) of piriformis muscle.     Side:  B/L     Sedation: Patient's choice.     PRN    Order Specific Question:   Where will this procedure be performed?    Answer:   [facility]   Follow-up plan:   Return for PRN- B/L SI-J & Piriformis- pt will call to schedule.    Recent Visits Date Type Provider Dept  02/06/22 Procedure visit Gillis Santa, MD [facility]  01/09/22 Procedure visit Gillis Santa, MD [facility]  12/29/21 Office Visit Gillis Santa, MD [facility]  12/21/21 Procedure visit Gillis Santa, MD [facility]  12/08/21 Office Visit Gillis Santa, MD [facility]  Showing recent visits within past 90 days and meeting all other requirements Today's Visits Date Type Provider Dept  03/06/22 Office Visit Gillis Santa, MD [facility]  Showing today's visits and meeting all other requirements Future Appointments No visits were found meeting these conditions. Showing future appointments within  next 90 days and meeting all other requirements  I discussed the assessment and treatment plan with the patient. The patient was provided an opportunity to ask questions and all were answered. The patient agreed with the plan and demonstrated an understanding of the instructions.  Patient advised to call back or seek an in-person evaluation if the symptoms or condition worsens.  Duration of encounter: 69mnutes.  Total time on encounter, as per AMA guidelines included both the face-to-face and non-face-to-face time personally spent by the physician and/or other qualified health care professional(s) on the day of the encounter (includes time in activities that require the physician or other qualified health care professional and does not include time in activities normally performed by clinical staff). Physician's time may include the following activities when performed: preparing to see the patient (eg, review of tests, pre-charting review of records) obtaining and/or reviewing separately obtained history performing a medically appropriate examination and/or evaluation counseling and educating the patient/family/caregiver ordering medications, tests, or  procedures referring and communicating with other health care professionals (when not separately reported) documenting clinical information in the electronic or other health record independently interpreting results (not separately reported) and communicating results to the patient/ family/caregiver care coordination (not separately reported)  Note by: Gillis Santa, MD Date: 03/06/2022; Time: 2:54 PM

## 2022-03-06 NOTE — Unmapped (Signed)
//   Falls likely due to Orthostatic syncope  - BP today Blood Pressure for the past 24 hrs:   BP   03/06/22 0924 90/64     - Will order Cardiology follow up and Echocardiogram. BP changes as above  - Educated pt on likely diagnosis and importance of slow positional changes and hydration

## 2022-03-06 NOTE — Unmapped (Addendum)
//   Hypertension: BP at goal?: at goal below 130/80   BP Readings from Last 3 Encounters:   03/06/22 90/64   12/26/21 116/64   12/01/21 120/70     - Last Labs:    Lab Results   Component Value Date    CREATININE 1.46 (H) 12/26/2021    K 4.4 12/26/2021    NA 144 12/26/2021    CO2 32.2 (H) 12/26/2021     - The patient's calculated ASCVD 10 Year Risk Score is 37.2%.   - Treated with: amlodipine (Norvasc) 5mg  and losartan (Cozaar) 50mg   as well as Sotalol for CHF/Afib. Will descraese amlodipine to 2.5mg  and recommend follow up with cardiology for repeat echo and sotalol decrease to prevent pre-syncope  - Instructed to measure home BP weekly and record; instructional handout given.  - Control dietary sodium and daily exercise.  - Follow up: 3 months

## 2022-03-06 NOTE — Unmapped (Signed)
Sacroiliac Pain: Exercises  Introduction  Here are some examples of exercises for you to try. The exercises may be suggested for a condition or for rehabilitation. Start each exercise slowly. Ease off the exercises if you start to have pain.  You will be told when to start these exercises and which ones will work best for you.  How to do the exercises      Knee-to-chest stretch   Do not do the knee-to-chest exercise if it causes or increases back or leg pain.  Lie on your back with your knees bent and your feet flat on the floor. You can put a small pillow under your head and neck if it is more comfortable.  Grasp your hands under one knee and bring the knee to your chest, keeping the other foot flat on the floor.  Keep your lower back pressed to the floor. Hold for at least 15 to 30 seconds.  Relax and lower the knee to the starting position. Repeat with the other leg.  Repeat 2 to 4 times with each leg.  To get more stretch, keep your other leg flat on the floor while pulling your knee to your chest.    Bridging   Lie on your back with both knees bent. Your knees should be bent about 90 degrees.  Tighten your belly muscles by pulling in your belly button toward your spine. Then push your feet into the floor, squeeze your buttocks, and lift your hips off the floor until your shoulders, hips, and knees are all in a straight line.  Hold for about 6 seconds as you continue to breathe normally, and then slowly lower your hips back down to the floor and rest for up to 10 seconds.  Repeat 8 to 12 times.    Hip extension   Get down on your hands and knees on the floor.  Keeping your back and neck straight, lift one leg straight out behind you. When you lift your leg, keep your hips level. Don't let your back twist, and don't let your hip drop toward the floor.  Hold for 6 seconds. Repeat 8 to 12 times with each leg.  If you feel steady and strong when you do this exercise, you can make it more difficult. To do this, when you lift your leg, also lift the opposite arm straight out in front of you. For example, lift the left leg and the right arm at the same time. (This is sometimes called the bird dog exercise.) Hold for 6 seconds, and repeat 8 to 12 times on each side.    Clamshell   Lie on your side with a pillow under your head. Keep your feet and knees together and your knees bent.  Raise your top knee, but keep your feet together. Do not let your hips roll back. Your legs should open up like a clamshell.  Hold for 6 seconds.  Slowly lower your knee back down. Rest for 10 seconds.  Repeat 8 to 12 times.  Switch to your other side and repeat steps 1 through 5.    Hamstring wall stretch   Lie on your back in a doorway, with one leg through the open door.  Slide your affected leg up the wall to straighten your knee. You should feel a gentle stretch down the back of your leg.  Do not arch your back.  Do not bend either knee.  Keep one heel touching the floor and the other heel touching the  wall. Do not point your toes.  Hold the stretch for at least 1 minute to begin. Then try to lengthen the time you hold the stretch to as long as 6 minutes.  Switch legs, and repeat steps 1 through 3.  Repeat 2 to 4 times.  If you do not have a place to do this exercise in a doorway, there is another way to do it:  Lie on your back, and bend one knee.  Loop a towel under the ball and toes of that foot, and hold the ends of the towel in your hands.  Straighten your knee, and slowly pull back on the towel. You should feel a gentle stretch down the back of your leg.  Switch legs, and repeat steps 1 through 3.  Repeat 2 to 4 times.    Lower abdominal strengthening   Lie on your back with your knees bent and your feet flat on the floor.  Tighten your belly muscles by pulling your belly button in toward your spine.  Lift one foot off the floor and bring your knee toward your chest, so that your knee is straight above your hip and your leg is bent like the letter L.  Lift the other knee up to the same position.  Lower one leg at a time to the starting position.  Keep alternating legs until you have lifted each leg 8 to 12 times.  Be sure to keep your belly muscles tight and your back still as you are moving your legs. Be sure to breathe normally.    Piriformis stretch   Lie on your back with your legs straight.  Lift your affected leg, and bend your knee. With your opposite hand, reach across your body, and then gently pull your knee toward your opposite shoulder.  Hold the stretch for 15 to 30 seconds.  Switch legs and repeat steps 1 through 3.  Repeat 2 to 4 times.    Follow-up care is a key part of your treatment and safety. Be sure to make and go to all appointments, and call your doctor if you are having problems. It's also a good idea to know your test results and keep a list of the medicines you take.  Where can you learn more?  Go to [facility] at https://myuncchart.org  [facility] under [employer]. Enter [phone] in the search box to learn more about Sacroiliac Pain: Exercises.  Current as of: October 24, 2017  Content Version: 12.3  ?? 2006-2019 [employer]. Care instructions adapted under license by [facility]. If you have questions about a medical condition or this instruction, always ask your healthcare professional. [employer] disclaims any warranty or liability for your use of this information.

## 2022-03-08 ENCOUNTER — Ambulatory Visit: Payer: Medicare Other

## 2022-03-10 NOTE — Progress Notes (Signed)
Chief Complaint  Patient presents with   Foot Ulcer    Follow up ulcer dorsal midfoot/ankle left   "Its sore, need a new foot"    HPI: 76 y.o. male PMHx diabetes mellitus presenting today for follow-up evaluation of an ulcer to the left foot.  Patient states that the wound is doing significantly better.  No new complaints at this time  Past Medical History:  Diagnosis Date   Arthritis    hands, knees   Atrial fibrillation (HCC)    CHF (congestive heart failure) (HCC)    COPD (chronic obstructive pulmonary disease) (HCC)    Diabetes mellitus without complication (H[facility])    Diabetic neuropathy ([facility])    Heart murmur    in past, mild 1969   Hypertension    resolved after weight loss and more activity   Neuromuscular disorder (HCC)    neuropathy   Neuropathy    Pulmonary embolism (HCC)    approx 2014   Shingles '99   Sleep apnea    CPAP   Wears dentures    full upper and lower    Past Surgical History:  Procedure Laterality Date   CATARACT EXTRACTION W/PHACO Left 05/12/2019   Procedure: CATARACT EXTRACTION PHACO AND INTRAOCULAR LENS PLACEMENT (IOC) LEFTDIABETIC ISTENT INJ 2.43  00:28.6;  Surgeon: Nevada Crane, MD;  Location: [facility];  Service: Ophthalmology;  Laterality: Left;  Diabetic - oral meds sleep apnea   CATARACT EXTRACTION W/PHACO Right 06/02/2019   Procedure: CATARACT EXTRACTION PHACO AND INTRAOCULAR LENS PLACEMENT (IOC) ISTENT INJ  RIGHT DIABETIC 1.31  00:25.4;  Surgeon: Nevada Crane, MD;  Location: [facility];  Service: Ophthalmology;  Laterality: Right;   COLONOSCOPY  08/30/2015   FINGER SURGERY Right    3'rd finger, right hand   KNEE SURGERY Right    screw in knee   WRIST SURGERY Left    metal removed    Allergies  Allergen Reactions   Rosuvastatin Anaphylaxis    "felt bad"   Ace Inhibitors Anxiety   Lisinopril     "had me climbing walls"   Simbrinza [Brinzolamide-Brimonidine] Itching    And burning     Physical  Exam: General: The patient is alert and oriented x3 in no acute distress.  Dermatology: The ulcer to the dorsum of the left foot has healed completely.  Complete reepithelialization has occurred.  There is no longer any open wound to the area.  Vascular: Palpable pedal pulses bilaterally. Capillary refill within normal limits.  Negative for any significant edema or erythema  Neurological: Light touch and protective threshold to noticed  Musculoskeletal Exam: No pedal deformities noted.  No prior amputations  Radiographic Exam LT foot 01/13/2022:  Normal osseous mineralization. Joint spaces preserved. No fracture/dislocation/boney destruction.    Assessment: 1.   Ulcer left foot with fat layer exposed; healed 2.  Diabetes mellitus with peripheral polyneuropathy  Plan of Care:  1. Patient evaluated.  2.  Patient may now resume full activity no restrictions 3.  Continue diabetic shoes with insoles 4.  Return to clinic as needed   Felecia Shelling, DPM [facility]  Dr. Felecia Shelling, DPM    [address], [place] [address]  Office [phone]  Fax [phone]

## 2022-03-13 ENCOUNTER — Ambulatory Visit: Payer: Medicare Other

## 2022-03-15 ENCOUNTER — Ambulatory Visit: Payer: Medicare Other

## 2022-03-20 ENCOUNTER — Ambulatory Visit: Payer: Medicare Other

## 2022-03-22 ENCOUNTER — Ambulatory Visit: Payer: Medicare Other

## 2022-03-26 NOTE — Progress Notes (Deleted)
BH MD/PA/NP OP Progress Note  03/26/2022 4:54 PM Mario [name]  MRN:  [id number]  Chief Complaint: No chief complaint on file.  HPI: *** Visit Diagnosis: No diagnosis found.  Past Psychiatric History: Please see initial evaluation for full details. I have reviewed the history. No updates at this time.     Past Medical History:  Past Medical History:  Diagnosis Date   Arthritis    hands, knees   Atrial fibrillation (HCC)    CHF (congestive heart failure) (HCC)    COPD (chronic obstructive pulmonary disease) (HCC)    Diabetes mellitus without complication (HCC)    Diabetic neuropathy (HCC)    Heart murmur    in past, mild 1969   Hypertension    resolved after weight loss and more activity   Neuromuscular disorder (HCC)    neuropathy   Neuropathy    Pulmonary embolism (HCC)    approx 2014   Shingles '99   Sleep apnea    CPAP   Wears dentures    full upper and lower    Past Surgical History:  Procedure Laterality Date   CATARACT EXTRACTION W/PHACO Left 05/12/2019   Procedure: CATARACT EXTRACTION PHACO AND INTRAOCULAR LENS PLACEMENT (IOC) LEFTDIABETIC ISTENT INJ 2.43  00:28.6;  Surgeon: Nevada Crane, MD;  Location: [facility];  Service: Ophthalmology;  Laterality: Left;  Diabetic - oral meds sleep apnea   CATARACT EXTRACTION W/PHACO Right 06/02/2019   Procedure: CATARACT EXTRACTION PHACO AND INTRAOCULAR LENS PLACEMENT (IOC) ISTENT INJ  RIGHT DIABETIC 1.31  00:25.4;  Surgeon: Nevada Crane, MD;  Location: [facility];  Service: Ophthalmology;  Laterality: Right;   COLONOSCOPY  08/30/2015   FINGER SURGERY Right    3'rd finger, right hand   KNEE SURGERY Right    screw in knee   WRIST SURGERY Left    metal removed    Family Psychiatric History: Please see initial evaluation for full details. I have reviewed the history. No updates at this time.     Family History: No family history on file.  Social History:  Social History   Socioeconomic  History   Marital status: Widowed    Spouse name: Not on file   Number of children: 5   Years of education: Not on file   Highest education level: Associate degree: occupational, Scientist, product/process development, or vocational program  Occupational History   Not on file  Tobacco Use   Smoking status: Former    Packs/day: 0.50    Years: 1.00    Total pack years: 0.50    Types: Cigarettes    Quit date: 05/01/1969    Years since quitting: 52.9   Smokeless tobacco: Never   Tobacco comments:    Quit 1971  Vaping Use   Vaping Use: Never used  Substance and Sexual Activity   Alcohol use: No   Drug use: No   Sexual activity: Not Currently  Other Topics Concern   Not on file  Social History Narrative   Not on file   Social Determinants of Health   Financial Resource Strain: Not on file  Food Insecurity: Not on file  Transportation Needs: Not on file  Physical Activity: Not on file  Stress: Not on file  Social Connections: Not on file    Allergies:  Allergies  Allergen Reactions   Rosuvastatin Anaphylaxis    "felt bad"   Ace Inhibitors Anxiety   Lisinopril     "had me climbing walls"   Simbrinza [Brinzolamide-Brimonidine] Itching  And burning    Metabolic Disorder Labs: Lab Results  Component Value Date   HGBA1C 6.8 (H) 02/15/2016   No results found for: "PROLACTIN" No results found for: "CHOL", "TRIG", "HDL", "CHOLHDL", "VLDL", "LDLCALC" No results found for: "TSH"  Therapeutic Level Labs: No results found for: "LITHIUM" No results found for: "VALPROATE" No results found for: "CBMZ"  Current Medications: Current Outpatient Medications  Medication Sig Dispense Refill   albuterol (PROVENTIL HFA;VENTOLIN HFA) 108 (90 Base) MCG/ACT inhaler Inhale 2 puffs into the lungs as needed.     APPLE CIDER VINEGAR PO Take by mouth daily.     atorvastatin (LIPITOR) 20 MG tablet Take 20 mg by mouth daily.     buPROPion (WELLBUTRIN) 100 MG tablet Take 100 mg by mouth 2 (two) times daily.      celecoxib (CELEBREX) 200 MG capsule Take 200 mg by mouth daily.     DULoxetine (CYMBALTA) 60 MG capsule am     edoxaban (SAVAYSA) 60 MG TABS tablet Take 60 mg by mouth daily.     empagliflozin (JARDIANCE) 10 MG TABS tablet Take by mouth daily.     furosemide (LASIX) 80 MG tablet Take 1 tablet by mouth 2 (two) times daily. 80 mg AM, 120 mg PM     gabapentin (NEURONTIN) 300 MG capsule Take 800 mg by mouth 3 (three) times daily.     gentamicin cream (GARAMYCIN) 0.1 % Apply 1 Application topically 2 (two) times daily. 30 g 1   glucose blood test strip Use as instructed FOR TESTING three times daily.  E11.9     hydrochlorothiazide (HYDRODIURIL) 12.5 MG tablet Take 1 tablet (12.5 mg total) by mouth daily. 30 tablet 0   ipratropium-albuterol (DUONEB) 0.5-2.5 (3) MG/3ML SOLN Inhale into the lungs.     losartan (COZAAR) 50 MG tablet Take 50 mg by mouth daily.     metFORMIN (GLUCOPHAGE) 500 MG tablet Take 500 mg by mouth daily with breakfast.     mirtazapine (REMERON) 15 MG tablet Take 15 mg by mouth at bedtime.     OZEMPIC, 1 MG/DOSE, 2 MG/1.5ML SOPN Inject 1 mg into the skin once a week.     Risankizumab-rzaa (SKYRIZI) 150 MG/ML SOSY Inject into the skin.     ROCKLATAN 0.02-0.005 % SOLN Apply 1 drop to eye at bedtime.     sotalol (BETAPACE) 80 MG tablet SMARTSIG:1 Tablet(s) By Mouth Every 12 Hours     SOTALOL AF 80 MG TABS Take by mouth 2 (two) times daily. Breakfast and dinner (Patient not taking: Reported on 02/06/2022)     tiZANidine (ZANAFLEX) 2 MG tablet Take 2 mg by mouth 3 (three) times daily.     TRELEGY ELLIPTA 100-62.5-25 MCG/ACT AEPB Take 1 puff by mouth daily.     triamcinolone cream (KENALOG) 0.1 % Apply 1 Application topically 2 (two) times daily.     TURMERIC PO Take by mouth daily. (Patient not taking: Reported on 02/06/2022)     No current facility-administered medications for this visit.     Musculoskeletal: Strength & Muscle Tone: within normal limits Gait & Station:  normal Patient leans: N/A  Psychiatric Specialty Exam: Review of Systems  There were no vitals taken for this visit.There is no height or weight on file to calculate BMI.  General Appearance: {Appearance:22683}  Eye Contact:  {BHH EYE CONTACT:22684}  Speech:  Clear and Coherent  Volume:  Normal  Mood:  {BHH MOOD:22306}  Affect:  {Affect (PAA):22687}  Thought Process:  Coherent  Orientation:  Full (Time, Place, and Person)  Thought Content: Logical   Suicidal Thoughts:  {ST/HT (PAA):22692}  Homicidal Thoughts:  {ST/HT (PAA):22692}  Memory:  Immediate;   Good  Judgement:  {Judgement (PAA):22694}  Insight:  {Insight (PAA):22695}  Psychomotor Activity:  Normal  Concentration:  Concentration: Good and Attention Span: Good  Recall:  Good  Fund of Knowledge: Good  Language: Good  Akathisia:  No  Handed:  Right  AIMS (if indicated): not done  Assets:  Communication Skills Desire for Improvement  ADL's:  Intact  Cognition: WNL  Sleep:  {BHH GOOD/FAIR/POOR:22877}   Screenings: PHQ2-9    Flowsheet Row [facility] Office Visit from 02/02/2022 in [facility] Office Visit from [facility]/08[facility]2021 in [facility] Pulmonary Rehab from [facility] Pulmonary [facility]  PHQ-2 Total Score 0 3 4 3 3   PHQ-9 Total Score -- 11 18 12 8       Flowsheet Row [facility]  [facility] from [facility] from [facility]/28/20[facility] [place]   C-SSRS RISK CATEGORY No Risk No Risk No Risk        Assessment and Plan:  Mario [name] is a 76 y.o. year old male with a history of depression, COPD, CHF, diabetes, diabetic neuropathy, hypertension, pulmonnary emplosim. 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     1. MDD (major depressive disorder), recurrent episode, moderate (HCC) He reports depressive symptoms in the context of demoraizatoin secondary to exacerbation in his back pain, which he attributes to sciatica and COPD.  Other psychosocial stressors includes retirement, relocation from [place].  He reports great relationship/support from his girlfriend of several years.  Will continue current medication regimen given bupropion has recently been started by his provider.  Will continue duloxetine to target depression; may consider uptitration of this medication in the future after confirming his renal function.  Will continue mirtazapine at this time to target depression, and satiety and insomnia.  He will greatly benefit from CBT/supportive therapy; will make referral. Obtain lab (TSH) to rule out medical issue contributing to his mood symptoms.    # AKI According to the chart review, there was worsening in creatinine.  He was advised to contact his PCP regarding this blot test result.    Plan Continue duloxetine 60 mg daily - maximum dose at his GFR Continue bupropion 100 mg twice a day (started about a month ago) Continue mirtazapine 15 mg at night Obtain lab (TSH)  Referral to therapy Next appointment 11/28 at 4:30, in person - on gabapentin 800 mg three times a day. - on Ozempic   The patient demonstrates the following risk factors for suicide: Chronic risk factors for suicide include: psychiatric disorder of depression . Acute risk factors for suicide include: unemployment and loss (financial, interpersonal, professional). Protective factors for this patient include: positive social support, coping skills, and hope for the future. Considering these factors, the overall suicide risk at this point appears to be low. Patient is appropriate for outpatient  follow up.        Collaboration of Care: Collaboration of Care: {BH OP Collaboration of ZOXW:[id number]}Care:[id number]}  Patient/Guardian was advised Release of Information must  be obtained prior to any record release in order to collaborate their care with an outside provider. Patient/Guardian was advised if they have not already done so to contact the registration department to sign all necessary forms in order for [place] to release information regarding their care.   Consent: Patient/Guardian gives verbal consent for treatment and assignment of benefits for services provided during this visit. Patient/Guardian expressed understanding and agreed to proceed.    Neysa Hotter, MD 03/26/2022, 4:54 PM

## 2022-03-27 ENCOUNTER — Ambulatory Visit: Admit: 2022-03-27 | Discharge: 2022-03-28 | Payer: MEDICARE

## 2022-03-27 ENCOUNTER — Ambulatory Visit: Payer: Medicare Other

## 2022-03-27 DIAGNOSIS — J449 Chronic obstructive pulmonary disease, unspecified: Principal | ICD-10-CM

## 2022-03-27 DIAGNOSIS — I951 Orthostatic hypotension: Principal | ICD-10-CM

## 2022-03-27 DIAGNOSIS — I152 Hypertension secondary to endocrine disorders: Principal | ICD-10-CM

## 2022-03-27 DIAGNOSIS — R1310 Dysphagia, unspecified: Principal | ICD-10-CM

## 2022-03-27 DIAGNOSIS — E1159 Type 2 diabetes mellitus with other circulatory complications: Principal | ICD-10-CM

## 2022-03-27 DIAGNOSIS — R296 Repeated falls: Principal | ICD-10-CM

## 2022-03-28 ENCOUNTER — Ambulatory Visit: Payer: Medicare Other | Admitting: Psychiatry

## 2022-03-28 NOTE — Unmapped (Signed)
Paul Velez reports his psoriasis continues to improve but he's not completely clear yet. He's had no new flares.    He is seeing pulmonology and cardiology for SOB symptoms.     [facility] Clinical Assessment & Refill Coordination Note    Spyridon Hornstein, [place]: 1945-06-02  Phone: [phone] (home)     All above HIPAA information was verified with patient.     Was a Nurse, learning disability used for this call? No    Specialty Medication(s):   Inflammatory Disorders: Skyrizi     Current Outpatient Medications   Medication Sig Dispense Refill    albuterol sulfate 90 mcg/actuation AePB Inhale.      amlodipine (NORVASC) 2.5 MG tablet Take 1 tablet (2.5 mg total) by mouth daily.      atorvastatin (LIPITOR) 20 MG tablet       buPROPion (WELLBUTRIN SR) 100 MG 12 hr tablet Take 1 tablet (100 mg total) by mouth in the morning. 60 tablet 2    celecoxib (CELEBREX) 200 MG capsule Take 1 capsule (200 mg total) by mouth two (2) times a day as needed for pain. 90 capsule 2    clobetasoL (TEMOVATE) 0.05 % ointment Apply topically Two (2) times a day. Use on psoriasis until smooth. 60 g 1    DULoxetine (CYMBALTA) 60 MG capsule Take 1 capsule (60 mg total) by mouth daily. 90 capsule 3    edoxaban (SAVAYSA) 60 mg tablet Take 1 tablet (60 mg total) by mouth daily. 90 tablet 3    empagliflozin (JARDIANCE) 10 mg tablet Take 1 tablet (10 mg total) by mouth daily. 90 tablet 3    empty container Misc Use as directed to dispose of [employer]. 1 each 2    furosemide (LASIX) 80 MG tablet Take 1 tablet (80 mg total) by mouth daily. 90 tablet 3    gabapentin (NEURONTIN) 800 MG tablet Take 1 tablet (800 mg total) by mouth Three (3) times a day. (Patient taking differently: Take 1 tablet (800 mg total) by mouth two (2) times a day.) 270 tablet 0    hydroCHLOROthiazide (HYDRODIURIL) 12.5 MG tablet       hydrocortisone 2.5 % cream Apply topically Two (2) times a day. Apply behind your ears. 30 g 1    losartan (COZAAR) 50 MG tablet Take 1 tablet (50 mg total) by mouth daily.      metFORMIN (GLUCOPHAGE) 1000 MG tablet Take 0.5 tablets (500 mg total) by mouth in the morning and 0.5 tablets (500 mg total) in the evening. Take with meals. 90 tablet 3    mirtazapine (REMERON) 15 MG tablet Take 1 tablet (15 mg total) by mouth nightly. 30 tablet 2    ONETOUCH VERIO TEST STRIPS Strp Use as directed. 100 strip 3    risankizumab-rzaa (SKYRIZI) 150 mg/mL PnIj Inject the contents of 1 pen (150mg ) under the skin at weeks 0 and 4 as loading doses. 2 mL 0    risankizumab-rzaa 150 mg/mL PnIj Inject the contents of 1 pen (150 mg total) under the skin Every three (3) months. 2 mL 1    semaglutide (OZEMPIC) 1 mg/dose (4 mg/3 mL) PnIj injection Inject 1 mg under the skin every seven (7) days. 9 mL 1    sotalol (BETAPACE) 80 MG tablet Take 1 tablet (80 mg total) by mouth two (2) times a day.      tamsulosin (FLOMAX) 0.4 mg capsule Take 1 capsule (0.4 mg total) by mouth daily. 90 capsule 3  TRELEGY ELLIPTA 100-62.5-25 mcg inhaler       triamcinolone (KENALOG) 0.1 % cream Apply topically two (2) times a day. For your legs. 453.6 g 2    umeclidinium (INCRUSE ELLIPTA) 62.5 mcg/actuation inhaler Inhale 1 puff daily.       No current facility-administered medications for this visit.        Changes to medications: Jerral reports no changes at this time.    Allergies   Allergen Reactions    Rosuvastatin Anaphylaxis     felt bad    Ace Inhibitors Anxiety    Lisinopril Other (See Comments)     Other reaction(s): Mental Status Change, Cough    Brinzolamide-Brimonidine Itching     And burning  And burning         Changes to allergies: No    SPECIALTY MEDICATION ADHERENCE     Skyrizi 150 mg: 0 days of medicine on hand     Medication Adherence    Patient reported X missed doses in the last month: 0  Specialty Medication: Skyrizi                       Specialty medication(s) dose(s) confirmed: Regimen is correct and unchanged.     Are there any concerns with adherence? No    Adherence counseling provided? Not needed    CLINICAL MANAGEMENT AND INTERVENTION      Clinical Benefit Assessment:    Do you feel the medicine is effective or helping your condition? Yes    Clinical Benefit counseling provided? Not needed    Adverse Effects Assessment:    Are you experiencing any side effects? No    Are you experiencing difficulty administering your medicine? No    Quality of Life Assessment:    Quality of Life    Rheumatology  Oncology  Dermatology  1. What impact has your specialty medication had on the symptoms of your skin condition (i.e. itchiness, soreness, stinging)?: Some  2. What impact has your specialty medication had on your comfort level with your skin?: Some  Cystic Fibrosis          How many days over the past month did your psoriasis  keep you from your normal activities? For example, brushing your teeth or getting up in the morning. 0    Have you discussed this with your provider? Not needed    Acute Infection Status:    Acute infections noted within Epic:  No active infections  Patient reported infection: None    Therapy Appropriateness:    Is therapy appropriate and patient progressing towards therapeutic goals? Yes, therapy is appropriate and should be continued    DISEASE/MEDICATION-SPECIFIC INFORMATION      For patients on injectable medications: Patient currently has 0 doses left.  Next injection is scheduled for 12/13.    Chronic Inflammatory Diseases: Have you experienced any flares in the last month? No    PATIENT SPECIFIC NEEDS     Does the patient have any physical, cognitive, or cultural barriers? No    Is the patient high risk? No    Did the patient require a clinical intervention? No    Does the patient require physician intervention or other additional services (i.e., nutrition, smoking cessation, social work)? No    SOCIAL DETERMINANTS OF HEALTH     At the [facility], we have learned that life circumstances - like trouble affording food, housing, utilities, or transportation can affect the health of many  of our patients.   That is why we wanted to ask: are you currently experiencing any life circumstances that are negatively impacting your health and/or quality of life? Patient declined to answer    Social Determinants of Health     Financial Resource Strain: Not on file   Internet Connectivity: Not on file   Food Insecurity: No Food Insecurity (09/22/2021)    Hunger Vital Sign     Worried About Running Out of Food in the Last Year: Never true     Ran Out of Food in the Last Year: Never true   Tobacco Use: Medium Risk (03/06/2022)    Patient History     Smoking Tobacco Use: Former     Smokeless Tobacco Use: Never     Passive Exposure: Not on file   Housing/Utilities: Not on file   Alcohol Use: Not on file   Transportation Needs: Not on file   Substance Use: Not on file   Health Literacy: Not on file   Physical Activity: Not on file   Interpersonal Safety: Not on file   Stress: Not on file   Intimate Partner Violence: Not on file   Depression: Not at risk (06/24/2021)    PHQ-2     PHQ-2 Score: 0   Social Connections: Not on file       Would you be willing to receive help with any of the needs that you have identified today? Not applicable       SHIPPING     Specialty Medication(s) to be Shipped:   Inflammatory Disorders: Skyrizi    Other medication(s) to be shipped:  triamcinolone, test strips     Changes to insurance: No    Delivery Scheduled: Yes, Expected medication delivery date: 12/13.     Medication will be delivered via Same Day Courier to the confirmed prescription address in [facility].    The patient will receive a drug information handout for each medication shipped and additional FDA Medication Guides as required.  Verified that patient has previously received a Conservation officer, historic buildings and a Surveyor, mining.    The patient or caregiver noted above participated in the development of this care plan and knows that they can request review of or adjustments to the care plan at any time.      All of the patient's questions and concerns have been addressed.    [name]   [facility] Specialty Pharmacist

## 2022-03-28 NOTE — Unmapped (Signed)
Assessment/Plan:        Diagnoses and all orders for this visit:    Frequent falls  -     Ambulatory referral to Neurology; Future    Orthostatic hypotension    Hypertension associated with diabetes (CMS-HCC)    Dysphagia, unspecified type  -     Cancel: Gastroenterology; Future  -     Gastroenterology; Future      Discussed case with Dr. Criselda Peaches    Per pcp note from 11/6, instructed to decrease amlodipine to 2.5mg  daily. Discussed medication regimen. He has continued to take amlodipine 5mg  daily. He will start lower 2.5mg  amlodipine dose tomorrow. Continue to monitor BP at home. Recurrent falls are likely related to orthostatic hypotension. Continue slow positional changes. Discussed importance of hydration.     He needs follow up with Cardiology. Referral to cardiology/echo ordered by pcp. Referral to PT placed by pcp. Will also place a referral to Neurology today for further evaluation.     Dysphagia. Tolerating PO intake. No odynophagia. No hematemesis or coffee ground emesis. Will refer to GI for further evaluation/EGD if appropriate.       Follow up: with pcp on 04/17/22. Contact clinic with any new or worsening symptoms. We discussed when to present to ED or seek sooner follow up visit for above conditions. Patient/partner expressed understanding.     HOPI:      Patient ID:  Paul Velez is a 76 y.o. male who presents for evaluation of worsening falls the last 3 weeks. He is falling about once per week. States falls occur after getting up out of bed or from sitting position. Also reports a h/o sciatica and states this seems to make him fall at times, right knee will also give out. Reports some associated weakness and generalized fatigue. Reports drinking approx 3 glasses of water per day. See pcp note from 11/6 for more details.     Denies any LOC or head trauma. Denies HA, cp, sob or difficulty breathing. On oxygen 2L [place] for h/o copd. Denies fever/chills.     He is also reporting difficulty swallowing the last few weeks. States food will occasionally get stuck (points to jugular notch area). Reports a mild pain when pressing on anterior neck in this area. He is tolerating PO intake and has not made any changes to his diet. Reports an occasional cough. Denies any retrosternal burning or regurgitation. Denies odynophagia, hematemesis or coffee ground emesis. Denies n/v, abdominal pain. Unsure of weight loss.       Review of Systems negative unless otherwise noted in HPI.         Physical Exam:     Labs:  No results found for this or any previous visit (from the past 24 hour(s)).    Vitals:    03/27/22 1634 03/27/22 1646   BP: 96/56 88/55   Pulse: 64 77   Resp: 20 20   Temp: 36.6 ??C (97.9 ??F) 36.6 ??C (97.9 ??F)   SpO2:  92%       GENERAL: Well appearing, alert, in NAD. Exam completed in wheelchair.   HEAD: Normocephalic and atraumatic  EYES: No exophthalmos. EOMI. Lids- No crusting or drainage. Conjunctiva- clear. Sclera- anicteric. PER  EARS: Hearing grossly intact   NOSE/MOUTH/THROAT: No nasal deformity. Posterior oropharynx is clear. No oral lesions. Mucous membranes moist.  NECK: Supple w/o adenopathy, thyromegaly or masses. Mild TTP to jugular notch area.   CHEST: Diminished breath sounds on auscultation bilaterally. No wheezes or crackles  appreciated. Respirations are unlabored. Oxygen 2L [place].  HEART: RRR. +murmur  VASCULAR: No carotid bruits. 2+ and symmetric peripheral pulses. No edema  MUSCULOSKELETAL: Mild joint line tenderness to right knee. No deformity or swelling.  INTEGUMENT: Skin is warm and dry.  No rashes or concerning skin lesions.  NEUROLOGIC:  Alert, oriented to person, place and time. Normal speech. +b/l LE weakness. No focal findings  PSYCHOLOGIC: No evidence of anxiety. Normal mood. Appropriate affect     Past Medical/Surgical History:  Past Medical History:   Diagnosis Date    Allergic     CHF (congestive heart failure) (CMS-HCC)     COPD (chronic obstructive pulmonary disease) (CMS-HCC) Diabetes type 2, controlled (CMS-HCC)     HLD (hyperlipidemia)     HTN (hypertension)     Neuropathy     OA (osteoarthritis) of knee     PAF (paroxysmal atrial fibrillation) (CMS-HCC)     Sarcoidosis      No past surgical history on file.    Allergies:  He is allergic to rosuvastatin, ace inhibitors, lisinopril, and brinzolamide-brimonidine.    Current Medications:  Current Outpatient Medications   Medication Sig Dispense Refill    albuterol sulfate 90 mcg/actuation AePB Inhale.      amlodipine (NORVASC) 2.5 MG tablet Take 1 tablet (2.5 mg total) by mouth daily.      atorvastatin (LIPITOR) 20 MG tablet       buPROPion (WELLBUTRIN SR) 100 MG 12 hr tablet Take 1 tablet (100 mg total) by mouth in the morning. 60 tablet 2    celecoxib (CELEBREX) 200 MG capsule Take 1 capsule (200 mg total) by mouth two (2) times a day as needed for pain. 90 capsule 2    clobetasoL (TEMOVATE) 0.05 % ointment Apply topically Two (2) times a day. Use on psoriasis until smooth. 60 g 1    DULoxetine (CYMBALTA) 60 MG capsule Take 1 capsule (60 mg total) by mouth daily. 90 capsule 3    edoxaban (SAVAYSA) 60 mg tablet Take 1 tablet (60 mg total) by mouth daily. 90 tablet 3    empagliflozin (JARDIANCE) 10 mg tablet Take 1 tablet (10 mg total) by mouth daily. 90 tablet 3    empty container Misc Use as directed to dispose of [employer]. 1 each 2    furosemide (LASIX) 80 MG tablet Take 1 tablet (80 mg total) by mouth daily. 90 tablet 3    gabapentin (NEURONTIN) 800 MG tablet Take 1 tablet (800 mg total) by mouth Three (3) times a day. (Patient taking differently: Take 1 tablet (800 mg total) by mouth two (2) times a day.) 270 tablet 0    hydroCHLOROthiazide (HYDRODIURIL) 12.5 MG tablet       hydrocortisone 2.5 % cream Apply topically Two (2) times a day. Apply behind your ears. 30 g 1    losartan (COZAAR) 50 MG tablet Take 1 tablet (50 mg total) by mouth daily.      metFORMIN (GLUCOPHAGE) 1000 MG tablet Take 0.5 tablets (500 mg total) by mouth in the morning and 0.5 tablets (500 mg total) in the evening. Take with meals. 90 tablet 3    mirtazapine (REMERON) 15 MG tablet Take 1 tablet (15 mg total) by mouth nightly. 30 tablet 2    ONETOUCH VERIO TEST STRIPS Strp TEST TWICE DAILY      risankizumab-rzaa (SKYRIZI) 150 mg/mL PnIj Inject the contents of 1 pen (150mg ) under the skin at weeks 0 and 4 as loading doses. 2  mL 0    risankizumab-rzaa 150 mg/mL PnIj Inject the contents of 1 pen (150 mg total) under the skin Every three (3) months. 2 mL 1    semaglutide (OZEMPIC) 1 mg/dose (4 mg/3 mL) PnIj injection Inject 1 mg under the skin every seven (7) days. 9 mL 1    sotalol (BETAPACE) 80 MG tablet Take 1 tablet (80 mg total) by mouth two (2) times a day.      tamsulosin (FLOMAX) 0.4 mg capsule Take 1 capsule (0.4 mg total) by mouth daily. 90 capsule 3    TRELEGY ELLIPTA 100-62.5-25 mcg inhaler       triamcinolone (KENALOG) 0.1 % cream Apply topically Two (2) times a day. For your legs. 453.6 g 2    umeclidinium (INCRUSE ELLIPTA) 62.5 mcg/actuation inhaler Inhale 1 puff daily.       No current facility-administered medications for this visit.       I have reviewed past medical, surgical, allergies, medications, social and family histories today and updated them in Epic where appropriate.

## 2022-03-29 ENCOUNTER — Ambulatory Visit: Payer: Medicare Other

## 2022-04-03 ENCOUNTER — Ambulatory Visit: Payer: Medicare Other

## 2022-04-05 ENCOUNTER — Ambulatory Visit: Payer: Medicare Other

## 2022-04-05 ENCOUNTER — Other Ambulatory Visit: Payer: Self-pay | Admitting: Internal Medicine

## 2022-04-05 DIAGNOSIS — R0602 Shortness of breath: Secondary | ICD-10-CM

## 2022-04-05 DIAGNOSIS — I2089 Other forms of angina pectoris: Secondary | ICD-10-CM

## 2022-04-05 DIAGNOSIS — R079 Chest pain, unspecified: Secondary | ICD-10-CM

## 2022-04-06 MED ORDER — JARDIANCE 10 MG TABLET
ORAL_TABLET | Freq: Every day | ORAL | 10 refills | 0 days
Start: 2022-04-06 — End: ?

## 2022-04-07 ENCOUNTER — Encounter (HOSPITAL_COMMUNITY): Payer: Self-pay

## 2022-04-07 ENCOUNTER — Telehealth (HOSPITAL_COMMUNITY): Payer: Self-pay | Admitting: *Deleted

## 2022-04-07 DIAGNOSIS — E1169 Type 2 diabetes mellitus with other specified complication: Principal | ICD-10-CM

## 2022-04-07 DIAGNOSIS — L400 Psoriasis vulgaris: Principal | ICD-10-CM

## 2022-04-07 MED ORDER — IVABRADINE HCL 7.5 MG PO TABS: ORAL_TABLET | ORAL | 0 refills | Status: AC

## 2022-04-07 MED ORDER — METOPROLOL TARTRATE 100 MG PO TABS: ORAL_TABLET | ORAL | 0 refills | Status: AC

## 2022-04-07 MED ORDER — JARDIANCE 10 MG TABLET
ORAL_TABLET | Freq: Every day | ORAL | 10 refills | 30 days
Start: 2022-04-07 — End: ?

## 2022-04-07 MED ORDER — TRIAMCINOLONE ACETONIDE 0.1 % TOPICAL CREAM
Freq: Two times a day (BID) | TOPICAL | 2 refills | 0 days
Start: 2022-04-07 — End: 2023-04-07

## 2022-04-07 MED ORDER — ONETOUCH VERIO TEST STRIPS
ORAL_STRIP | 3 refills | 0 days | Status: CP
Start: 2022-04-07 — End: ?
  Filled 2022-04-12: qty 100, 50d supply, fill #0

## 2022-04-07 NOTE — Telephone Encounter (Signed)
Reaching out to patient to offer assistance regarding upcoming cardiac imaging study; pt verbalizes understanding of appt date/time, parking situation and where to check in, pre-test NPO status and medications ordered, and verified current allergies; name and call back number provided for further questions should they arise  Larey Brick RN Navigator Cardiac Imaging [facility] [phone] office [phone] cell  Patient to hold his sotalol and take 100mg  metoprolol tartrate and 15mg  ivabradine two hours prior to his cardiac CT scan.

## 2022-04-07 NOTE — Unmapped (Signed)
Patient is requesting the following refill  Requested Prescriptions     Pending Prescriptions Disp Refills    ONETOUCH VERIO TEST STRIPS Strp  0       Recent Visits  Date Type Provider Dept   03/27/22 Office Visit [name], [name], [place] [facility] Acute Care Services   03/06/22 Office Visit Criselda Peaches, Lidia Collum, MD [facility]   12/26/21 Office Visit Criselda Peaches, Lidia Collum, MD [facility]   12/01/21 Office Visit Criselda Peaches Lidia Collum, MD [facility]   11/30/21 Office Visit Gilford Silvius, NP [facility] Acute Care Services   10/24/21 Office Visit Claudean Kinds, MD [facility]   09/22/21 Office Visit Claudean Kinds, MD [facility]   06/24/21 Office Visit Claudean Kinds, MD [facility]   Showing recent visits within past 365 days with a meds authorizing provider and meeting all other requirements  Future Appointments  Date Type Provider Dept   04/17/22 Appointment Criselda Peaches Lidia Collum, MD [facility]   08/18/22 Appointment Criselda Peaches, Lidia Collum, MD [facility]   Showing future appointments within next 365 days with a meds authorizing provider and meeting all other requirements       Labs: Not applicable this refill

## 2022-04-08 NOTE — Unmapped (Unsigned)
LOV 12/15/21

## 2022-04-10 ENCOUNTER — Ambulatory Visit
Admission: RE | Admit: 2022-04-10 | Discharge: 2022-04-10 | Disposition: A | Discharge status: Home / Self Care | Payer: Medicare Other | Source: Ambulatory Visit | Attending: Cardiology | Admitting: Cardiology

## 2022-04-10 ENCOUNTER — Ambulatory Visit
Admission: RE | Admit: 2022-04-10 | Discharge: 2022-04-10 | Disposition: A | Discharge status: Home / Self Care | Payer: Medicare Other | Source: Ambulatory Visit | Attending: Internal Medicine | Admitting: Internal Medicine

## 2022-04-10 ENCOUNTER — Other Ambulatory Visit: Payer: Self-pay | Admitting: Internal Medicine

## 2022-04-10 ENCOUNTER — Other Ambulatory Visit: Payer: Self-pay | Admitting: Cardiology

## 2022-04-10 ENCOUNTER — Ambulatory Visit: Payer: Medicare Other

## 2022-04-10 DIAGNOSIS — R0602 Shortness of breath: Secondary | ICD-10-CM

## 2022-04-10 DIAGNOSIS — R931 Abnormal findings on diagnostic imaging of heart and coronary circulation: Secondary | ICD-10-CM | POA: Insufficient documentation

## 2022-04-10 DIAGNOSIS — I2089 Other forms of angina pectoris: Secondary | ICD-10-CM | POA: Insufficient documentation

## 2022-04-10 DIAGNOSIS — R079 Chest pain, unspecified: Secondary | ICD-10-CM | POA: Insufficient documentation

## 2022-04-10 MED ORDER — NITROGLYCERIN 0.4 MG SL SUBL
0.4000 mg | SUBLINGUAL_TABLET | Freq: Once | SUBLINGUAL | Status: AC
  Administered 2022-04-10: 0.4 mg via SUBLINGUAL
  Filled 2022-04-10: qty 25

## 2022-04-10 MED ORDER — IOHEXOL 350 MG/ML SOLN
100.0000 mL | Freq: Once | INTRAVENOUS | Status: AC | PRN
  Administered 2022-04-10: 100 mL via INTRAVENOUS

## 2022-04-10 MED ORDER — MIRTAZAPINE 15 MG TABLET
ORAL_TABLET | Freq: Every evening | ORAL | 2 refills | 30 days
Start: 2022-04-10 — End: 2023-04-10

## 2022-04-10 MED ORDER — TRIAMCINOLONE ACETONIDE 0.1 % TOPICAL CREAM
Freq: Two times a day (BID) | TOPICAL | 2 refills | 0 days | Status: CP
Start: 2022-04-10 — End: 2023-04-10
  Filled 2022-04-12: qty 454, 30d supply, fill #0

## 2022-04-10 MED ORDER — EMPAGLIFLOZIN 10 MG TABLET
ORAL_TABLET | Freq: Every day | ORAL | 3 refills | 90 days
Start: 2022-04-10 — End: ?

## 2022-04-10 MED ORDER — GABAPENTIN 800 MG TABLET
ORAL_TABLET | Freq: Three times a day (TID) | ORAL | 0 refills | 90 days
Start: 2022-04-10 — End: ?

## 2022-04-10 MED ORDER — OZEMPIC 1 MG/DOSE (4 MG/3 ML) SUBCUTANEOUS PEN INJECTOR
SUBCUTANEOUS | 1 refills | 84 days
Start: 2022-04-10 — End: 2022-10-07

## 2022-04-10 MED ORDER — BUPROPION HCL SR 100 MG TABLET,12 HR SUSTAINED-RELEASE
ORAL_TABLET | Freq: Every day | ORAL | 2 refills | 60 days
Start: 2022-04-10 — End: 2023-04-10

## 2022-04-10 NOTE — Progress Notes (Signed)
Patient tolerated procedure well. Ambulate w/o difficulty. Denies any lightheadedness or being dizzy. Pt denies any pain at this time. Sitting in chair. Pt is encouraged to drink additional water throughout the day and reason explained to patient. Patient verbalized understanding and all questions answered. ABC intact. No further needs at this time. Discharge from procedure area w/o issues. 

## 2022-04-10 NOTE — Progress Notes (Signed)
This RN spoke with Dr Myriam Forehand on the phone regarding pt's bp. Dr Myriam Forehand instructed this RN to give only 0.4 mg nitro to the pt and proceed with the test. Pt denies feeling dizzy at this time

## 2022-04-11 MED ORDER — JARDIANCE 10 MG TABLET
ORAL_TABLET | Freq: Every day | ORAL | 1 refills | 90 days | Status: CP
Start: 2022-04-11 — End: ?

## 2022-04-11 NOTE — Unmapped (Signed)
Patient is requesting the following refill  Requested Prescriptions     Pending Prescriptions Disp Refills    JARDIANCE 10 mg tablet [Pharmacy Med Name: JARDIANCE 10 MG TABLET 10 Tablet] 30 tablet 10     Sig: TAKE 1 TABLET BY MOUTH DAILY       Recent Visits  Date Type Provider Dept   03/27/22 Office Visit [name], [name], [place] [facility] Acute Care Services   03/06/22 Office Visit Claudean Kinds, MD [facility]   12/26/21 Office Visit Claudean Kinds, MD [facility]   12/01/21 Office Visit Claudean Kinds, MD [facility]   11/30/21 Office Visit Gilford Silvius, NP [facility] Acute Care Services   10/24/21 Office Visit Claudean Kinds, MD [facility]   09/22/21 Office Visit Claudean Kinds, MD [facility]   06/24/21 Office Visit Claudean Kinds, MD [facility]   Showing recent visits within past 365 days with a meds authorizing provider and meeting all other requirements  Future Appointments  Date Type Provider Dept   04/17/22 Appointment Criselda Peaches, Lidia Collum, MD [facility] Medical Group [facility]   08/18/22 Appointment Criselda Peaches, Lidia Collum, MD [facility]   Showing future appointments within next 365 days with a meds authorizing provider and meeting all other requirements       Labs: A1c:   Hemoglobin A1C (%)   Date Value   12/26/2021 6.7 (H)   09/14/2021 6.5 (A)    Creatinine:   Creatinine (mg/dL)   Date Value   16/01/9603 1.46 (H)

## 2022-04-12 ENCOUNTER — Other Ambulatory Visit (HOSPITAL_COMMUNITY): Payer: Self-pay

## 2022-04-12 ENCOUNTER — Ambulatory Visit: Payer: Medicare Other

## 2022-04-12 MED FILL — SKYRIZI 150 MG/ML SUBCUTANEOUS PEN INJECTOR: SUBCUTANEOUS | 84 days supply | Qty: 1 | Fill #1

## 2022-04-17 ENCOUNTER — Ambulatory Visit: Payer: Medicare Other

## 2022-04-19 ENCOUNTER — Ambulatory Visit: Payer: Medicare Other

## 2022-04-21 ENCOUNTER — Ambulatory Visit: Admit: 2022-04-21 | Discharge: 2022-04-22 | Payer: MEDICARE

## 2022-04-21 DIAGNOSIS — I152 Hypertension secondary to endocrine disorders: Principal | ICD-10-CM

## 2022-04-21 DIAGNOSIS — E785 Hyperlipidemia, unspecified: Principal | ICD-10-CM

## 2022-04-21 DIAGNOSIS — D638 Anemia in other chronic diseases classified elsewhere: Principal | ICD-10-CM

## 2022-04-21 DIAGNOSIS — J449 Chronic obstructive pulmonary disease, unspecified: Principal | ICD-10-CM

## 2022-04-21 DIAGNOSIS — I5022 Chronic systolic (congestive) heart failure: Principal | ICD-10-CM

## 2022-04-21 DIAGNOSIS — E1159 Type 2 diabetes mellitus with other circulatory complications: Principal | ICD-10-CM

## 2022-04-21 DIAGNOSIS — Z0000 Encounter for general adult medical examination without abnormal findings: Principal | ICD-10-CM

## 2022-04-21 DIAGNOSIS — E1169 Type 2 diabetes mellitus with other specified complication: Principal | ICD-10-CM

## 2022-04-21 LAB — HEMOGLOBIN A1C
ESTIMATED AVERAGE GLUCOSE: 160 mg/dL
HEMOGLOBIN A1C: 7.2 % — ABNORMAL HIGH (ref 4.8–5.6)

## 2022-04-21 LAB — COMPREHENSIVE METABOLIC PANEL
ALBUMIN: 3.7 g/dL (ref 3.4–5.0)
ALKALINE PHOSPHATASE: 106 U/L (ref 46–116)
ALT (SGPT): 24 U/L (ref 10–49)
ANION GAP: 6 mmol/L (ref 5–14)
AST (SGOT): 29 U/L (ref <=34)
BILIRUBIN TOTAL: 0.3 mg/dL (ref 0.3–1.2)
BLOOD UREA NITROGEN: 15 mg/dL (ref 9–23)
BUN / CREAT RATIO: 10
CALCIUM: 9.8 mg/dL (ref 8.7–10.4)
CHLORIDE: 104 mmol/L (ref 98–107)
CO2: 31.6 mmol/L — ABNORMAL HIGH (ref 20.0–31.0)
CREATININE: 1.47 mg/dL — ABNORMAL HIGH
EGFR CKD-EPI (2021) MALE: 49 mL/min/{1.73_m2} — ABNORMAL LOW (ref >=60)
GLUCOSE RANDOM: 130 mg/dL (ref 70–179)
POTASSIUM: 4.2 mmol/L (ref 3.4–4.8)
PROTEIN TOTAL: 7.7 g/dL (ref 5.7–8.2)
SODIUM: 142 mmol/L (ref 135–145)

## 2022-04-21 LAB — IRON PANEL
IRON SATURATION (CALC): 4 %
IRON: 15 ug/dL — ABNORMAL LOW
TOTAL IRON BINDING CAPACITY (CALC): 408.2 ug/dL (ref 250.0–425.0)
TRANSFERRIN: 324 mg/dL

## 2022-04-21 LAB — CBC
HEMATOCRIT: 31.8 % — ABNORMAL LOW (ref 39.0–48.0)
HEMOGLOBIN: 9.6 g/dL — ABNORMAL LOW (ref 12.9–16.5)
MEAN CORPUSCULAR HEMOGLOBIN CONC: 30.1 g/dL — ABNORMAL LOW (ref 32.0–36.0)
MEAN CORPUSCULAR HEMOGLOBIN: 24.6 pg — ABNORMAL LOW (ref 25.9–32.4)
MEAN CORPUSCULAR VOLUME: 81.9 fL (ref 77.6–95.7)
MEAN PLATELET VOLUME: 7.3 fL (ref 6.8–10.7)
PLATELET COUNT: 341 10*9/L (ref 150–450)
RED BLOOD CELL COUNT: 3.88 10*12/L — ABNORMAL LOW (ref 4.26–5.60)
RED CELL DISTRIBUTION WIDTH: 18.3 % — ABNORMAL HIGH (ref 12.2–15.2)
WBC ADJUSTED: 8.9 10*9/L (ref 3.6–11.2)

## 2022-04-21 LAB — FERRITIN: FERRITIN: 5 ng/mL — ABNORMAL LOW

## 2022-04-21 MED ORDER — LOSARTAN 50 MG TABLET
ORAL_TABLET | Freq: Every day | ORAL | 180 days | Status: CN
Start: 2022-04-21 — End: ?

## 2022-04-21 MED ORDER — AMLODIPINE 2.5 MG TABLET
ORAL_TABLET | Freq: Every day | ORAL | 3 refills | 90 days | Status: CP
Start: 2022-04-21 — End: 2023-04-21

## 2022-04-21 MED ORDER — DULOXETINE 60 MG CAPSULE,DELAYED RELEASE
ORAL_CAPSULE | Freq: Every day | ORAL | 3 refills | 90 days | Status: CP
Start: 2022-04-21 — End: 2023-04-21

## 2022-04-21 MED ORDER — OZEMPIC 1 MG/DOSE (4 MG/3 ML) SUBCUTANEOUS PEN INJECTOR
SUBCUTANEOUS | 3 refills | 84 days | Status: CP
Start: 2022-04-21 — End: 2022-10-18

## 2022-04-21 MED ORDER — ATORVASTATIN 20 MG TABLET
ORAL_TABLET | Freq: Every day | ORAL | 3 refills | 90 days | Status: CP
Start: 2022-04-21 — End: ?

## 2022-04-21 MED ORDER — CELECOXIB 200 MG CAPSULE
ORAL_CAPSULE | Freq: Two times a day (BID) | ORAL | 3 refills | 90 days | Status: CP | PRN
Start: 2022-04-21 — End: 2023-04-21

## 2022-04-21 MED ORDER — MIRTAZAPINE 15 MG TABLET
ORAL_TABLET | Freq: Every evening | ORAL | 3 refills | 90 days | Status: CP
Start: 2022-04-21 — End: 2023-04-21

## 2022-04-21 MED ORDER — HYDROCHLOROTHIAZIDE 12.5 MG TABLET
ORAL_TABLET | Freq: Every day | ORAL | 3 refills | 90 days | Status: CP
Start: 2022-04-21 — End: ?

## 2022-04-21 MED ORDER — SOTALOL 80 MG TABLET
ORAL_TABLET | Freq: Two times a day (BID) | ORAL | 3 refills | 90 days | Status: CP
Start: 2022-04-21 — End: 2023-04-21

## 2022-04-21 NOTE — Unmapped (Signed)
//   Hyperlipidemia:    - On atorvastatin 40mg   - Education regarding hyperlipidemia was given. Questions answered.  -   Lab Results   Component Value Date    LDL Calculated 56 12/26/2021    HDL 48 12/26/2021    Triglycerides 103 12/26/2021     - The 10-year ASCVD risk score (Arnett DK, et al., 2019) is: 24.6%

## 2022-04-21 NOTE — Unmapped (Signed)
//   Hypertension: BP at goal?: at goal below 130/80   BP Readings from Last 3 Encounters:   03/27/22 88/55   03/06/22 90/64   12/26/21 116/64     - Last Labs:    Lab Results   Component Value Date    CREATININE 1.46 (H) 12/26/2021    K 4.4 12/26/2021    NA 144 12/26/2021    CO2 32.2 (H) 12/26/2021     - The patient's calculated ASCVD 10 Year Risk Score is 24.6%.   - Treated with: amlodipine (Norvasc) 2.5mg  and losartan (Cozaar) 50mg  . Decrease losartan to 25mg . Follow up with cardiology for repeat echo and sotalol decrease to prevent pre-syncope  - Instructed to measure home BP weekly and record; instructional handout given.  - Control dietary sodium and daily exercise.  - Follow up: 3 months

## 2022-04-22 NOTE — Unmapped (Signed)
//   Anemia chronic disease and IDA  - Pt endorses fatigue and SOB  -   Lab Results   Component Value Date    HGB 9.3 (L) 12/26/2021    HGB 10.3 (L) 09/14/2021    HGB 12.0 (L) 06/24/2021      -   Lab Results   Component Value Date    HGB 9.3 (L) 12/26/2021    HCT 30.2 (L) 12/26/2021    MCV 82.6 12/26/2021    IRON 22 (L) 12/26/2021     - The diagnosis was discussed with the patient.  - Repeat labs and consider IV iron infusion given CHF and constipation with oral iron.   - Add miralax up to TID PRN

## 2022-04-22 NOTE — Unmapped (Addendum)
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 Dr. Liborio Velez  - Currently on:  Incruse Ellipta  and advair and oxygen therapy.   - Given increase in SOB, no weight gain or crackles on lung exam, likely function of anemia and progression of COPD/deconditioning.   - Refer to Pulm rehab given deconditioning over past 2 years.   - Recommend flu shot annually and pneumonia shot/booster if due.  - Oxygen Goal 88-92%: 50% reduction in mortality and NNH 14 when compared to high flow oxygen Paul Velez et al, BMJ 2014)  - Resistance band training of upper and lower extremities increases QOL and decreased dyspnea scores  (Nyberg et al, Clinical Respiratory Journal 2014)           Relevant Orders    AMB REFERRAL TO PHYSICAL THERAPY    Hypertension associated with diabetes (CMS-HCC) - Primary     // Hypertension: BP at goal?: at goal below 130/80   BP Readings from Last 3 Encounters:   03/27/22 88/55   03/06/22 90/64   12/26/21 116/64   - Last Labs:    Lab Results   Component Value Date    CREATININE 1.46 (H) 12/26/2021    K 4.4 12/26/2021    NA 144 12/26/2021    CO2 32.2 (H) 12/26/2021   - The patient's calculated ASCVD 10 Year Risk Score is 24.6%.   - Treated with: amlodipine (Norvasc) 2.5mg  and losartan (Cozaar) 50mg  . Decrease losartan to 25mg . Follow up with cardiology for repeat echo and sotalol decrease to prevent pre-syncope  - Instructed to measure home BP weekly and record; instructional handout given.  - Control dietary sodium and daily exercise.  - Follow up: 3 months           Relevant Medications    hydroCHLOROthiazide (HYDRODIURIL) 12.5 MG tablet    sotalol (BETAPACE) 80 MG tablet    amlodipine (NORVASC) 2.5 MG tablet    semaglutide (OZEMPIC) 1 mg/dose (4 mg/3 mL) PnIj injection    Hyperlipidemia associated with type 2 diabetes mellitus (CMS-HCC)     // Hyperlipidemia:    - On atorvastatin 40mg   - Education regarding hyperlipidemia was given. Questions answered.  -   Lab Results   Component Value Date    LDL Calculated 56 12/26/2021    HDL 48 12/26/2021    Triglycerides 103 12/26/2021   - The 10-year ASCVD risk score (Arnett DK, et al., 2019) is: 24.6%            Relevant Medications    atorvastatin (LIPITOR) 20 MG tablet    semaglutide (OZEMPIC) 1 mg/dose (4 mg/3 mL) PnIj injection    Routine health maintenance    Relevant Orders    Hemoglobin A1c    Comprehensive Metabolic Panel    CBC    Ferritin    Iron Panel    Anemia  in other chronic diseases classified elsewhere     // Anemia chronic disease and IDA  - Pt endorses fatigue and SOB  -   Lab Results   Component Value Date    HGB 9.3 (L) 12/26/2021    HGB 10.3 (L) 09/14/2021    HGB 12.0 (L) 06/24/2021      -   Lab Results   Component Value Date    HGB 9.3 (L) 12/26/2021    HCT 30.2 (L) 12/26/2021    MCV 82.6 12/26/2021    IRON 22 (L) 12/26/2021   - The diagnosis was discussed with the patient.  - Repeat labs and consider IV iron infusion given CHF and constipation with oral iron.   - Add miralax up to TID PRN              HEALTH MAINTENANCE ITEMS STILL DUE:  Health Maintenance Due   Topic Date Due    COPD Spirometry  Never done    Medicare Annual Wellness Visit (AWV)  Never done    Retinal Eye Exam  Never done    Zoster Vaccines (1 of 2) Never done    DTaP/Tdap/Td Vaccines (1 - Tdap) 10/31/2006    AAA Screening  Never done    COVID-19 Vaccine (6 - 2023-24 season) 04/02/2022       PHQ-2 Score: 1    PHQ-9 Score:      Paul Velez Score:      Screening complete, no depression identified / no further action needed today    Subjective   Paul Velez is a 76 y.o. male  coming to clinic today for the following issues:    Chief Complaint   Patient presents with    Diabetes    Hypertension       Diabetes    Hypertension        Saw cardiology and echo and CTC look good. No reduced EF and 40%tile for calcium score.     Shots in back did not work. Pain still there. Does not want to follw up.     Breathing. IF walking by self, wil be out of breath going from room to room. Feels like getting ready to pass out. After 3 seconds is fine.     Constipated.     There???s a need for greater stability and security than prescribed by a cane or crutches. In addition, the patient has upper body weakness or   the patient would have an unsteady gait when lifting a standard walker (no wheels). Frequent falls. Needs Rolator. Working with PT.       Paul Velez is a 76 y.o. male, with the following medical problems as documented above:      ROS    I have reviewed the problem list, medications, and allergies and have updated/reconciled them if needed.    Paul Velez  reports that he quit smoking about 53 years ago. His smoking use included cigarettes. He has never used smokeless tobacco.    Objective     VITALS: BP 98/68  - Pulse 67  - Temp 36.8 ??C (98.2 ??F) (Oral)  - Resp 23  - SpO2 97%     Wt Readings from Last 6 Encounters:   11/30/21 (!) 107.5 kg (237 lb)   09/22/21 (!) 108 kg (238 lb)   09/14/21 (!) 107.5 kg (237 lb)   06/24/21 (!) 107.6 kg (237 lb 3.2 oz)        Physical Exam  Vitals reviewed.  Constitutional:       General: He is not in acute distress.     Appearance: Normal appearance. He is well-developed. He is not diaphoretic.   HENT:      Head: Normocephalic and atraumatic.      Right Ear: Hearing normal.      Left Ear: Hearing normal.      Nose: Nose normal.   Eyes:      General: No scleral icterus.        Right eye: No discharge.         Left eye: No discharge.      Conjunctiva/sclera: Conjunctivae normal.      Right eye: Right conjunctiva is not injected.      Left eye: Left conjunctiva is not injected.   Neck:      Thyroid: No thyroid mass.      Vascular: No carotid bruit or JVD.      Trachea: Trachea and phonation normal.   Cardiovascular:      Rate and Rhythm: Normal rate and regular rhythm. No extrasystoles are present.     Pulses: Normal pulses.           Radial pulses are 2+ on the right side and 2+ on the left side.      Heart sounds: Murmur heard.   Pulmonary:      Effort: Pulmonary effort is normal. No respiratory distress.      Breath sounds: Wheezing present. No rales.      Comments: Oxygen 2L [place]    Musculoskeletal:         General: No deformity. Normal range of motion.      Cervical back: Normal range of motion.      Right lower leg: No edema.      Left lower leg: No edema.   Skin:     General: Skin is warm and dry.      Capillary Refill: Capillary refill takes less than 2 seconds.      Findings: Lesion and rash present. No bruising or erythema.      Nails: There is no clubbing.   Neurological:      Mental Status: He is alert and oriented to person, place, and time.      Cranial Nerves: No cranial nerve deficit.      Motor: Weakness present.      Gait: Gait normal.   Psychiatric:         Mood and Affect: Mood normal. Mood is not anxious.         Speech: Speech normal.         Behavior: Behavior normal.         Thought Content: Thought content normal.         Judgment: Judgment normal.         LABS/IMAGING  I have reviewed pertinent recent labs and imaging in Epic    Veatrice Kells, MD  [facility]  [facility]   [address] [place], [place] [address]  Telephone [phone]  Fax [phone]

## 2022-04-22 NOTE — Unmapped (Signed)
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 Dr. Liborio Nixon  - Currently on:  Incruse Ellipta  and advair and oxygen therapy.   - Given increase in SOB, no weight gain or crackles on lung exam, likely function of anemia and progression of COPD/deconditioning.   - Refer to Pulm rehab given deconditioning over past 2 years.   - Recommend flu shot annually and pneumonia shot/booster if due.  - Oxygen Goal 88-92%: 50% reduction in mortality and NNH 14 when compared to high flow oxygen [name] et al, BMJ 2014)  - Resistance band training of upper and lower extremities increases QOL and decreased dyspnea scores  (Nyberg et al, Clinical Respiratory Journal 2014)

## 2022-04-25 DIAGNOSIS — D508 Other iron deficiency anemias: Principal | ICD-10-CM

## 2022-04-25 DIAGNOSIS — D638 Anemia in other chronic diseases classified elsewhere: Principal | ICD-10-CM

## 2022-04-26 ENCOUNTER — Ambulatory Visit: Payer: 59

## 2022-05-03 ENCOUNTER — Ambulatory Visit: Payer: 59

## 2022-05-08 ENCOUNTER — Ambulatory Visit: Payer: 59

## 2022-05-08 NOTE — Unmapped (Signed)
Contacted patient to discuss several referrals pending for him as he has an urgent neurology referral from November that appears has not been addressed and I had contacted him in regards to this before and he wanted to handle his cardiology appointment/needs first and he has now been seen by his cardiologist so wanted to follow up accordingly so we were able to contact neurology and schedule him for an appointment on 1.19.2024 at 2:50 PM with Dr. Fabian November, he is very concerned about his anemia and also has a referral in the system for an iron infusion so also contacted [facility] and scheduled him for infusion on 2.22.2024 at 9:15 AM, and his sciatica and knees are also bothering him quite significantly and a PT referral has been placed and he also wanted to contact them to schedule and he has an initial visit on 2.13.2024 at 8:45 AM at the [facility] therapy center; he was appreciative of the assistance in getting the appointments scheduled and will check his mychart for the dates of his appointments.

## 2022-05-10 ENCOUNTER — Ambulatory Visit: Payer: 59

## 2022-05-10 MED ORDER — TIZANIDINE 2 MG TABLET
ORAL_TABLET | 10 refills | 0 days
Start: 2022-05-10 — End: ?

## 2022-05-11 MED ORDER — TIZANIDINE 2 MG TABLET
ORAL_TABLET | 10 refills | 0 days | Status: CP
Start: 2022-05-11 — End: ?

## 2022-05-11 NOTE — Unmapped (Signed)
Patient is requesting the following refill  Requested Prescriptions     Pending Prescriptions Disp Refills    tizanidine (ZANAFLEX) 2 MG tablet [Pharmacy Med Name: TIZANIDINE 2 MG TABS 2 Tablet] 30 tablet 10     Sig: TAKE 1 TABLET BY MOUTH EVERY 8 HOURS AS NEEDED FOR BACK SPASMS       Recent Visits  Date Type Provider Dept   04/21/22 Office Visit Claudean Kinds, MD [facility]   03/27/22 Office Visit [name], [name], [place] [facility] Acute [facility] Services   03/06/22 Office Visit Criselda Peaches, Lidia Collum, MD [facility]   12/26/21 Office Visit Claudean Kinds, MD [facility]   12/01/21 Office Visit Claudean Kinds, MD [facility]   11/30/21 Office Visit Gilford Silvius, NP [facility] Acute Care Services   10/24/21 Office Visit Claudean Kinds, MD [facility]   09/22/21 Office Visit Claudean Kinds, MD [facility]   06/24/21 Office Visit Claudean Kinds, MD [facility]   Showing recent visits within past 365 days with a meds authorizing provider and meeting all other requirements  Future Appointments  Date Type Provider Dept   08/18/22 Appointment Criselda Peaches, Lidia Collum, MD [facility]   Showing future appointments within next 365 days with a meds authorizing provider and meeting all other requirements       Labs: Not applicable this refill

## 2022-05-12 ENCOUNTER — Ambulatory Visit
Admit: 2022-05-12 | Payer: MEDICARE | Attending: Rehabilitative and Restorative Service Providers" | Primary: Rehabilitative and Restorative Service Providers"

## 2022-05-12 NOTE — Unmapped (Signed)
PULMONARY OUTPATIENT PHYSICAL THERAPY   EVALUATION AND PLAN OF CARE    Patient Name: Paul Velez  Date of Birth:Jul 31, 1945  Date: 05/12/2022  Session Number:  1  Therapy Diagnosis:   Encounter Diagnoses   Name Primary?    Chronic obstructive pulmonary disease, unspecified COPD type (CMS-HCC)     Impaired functional mobility, balance, gait, and endurance Yes    Back pain, unspecified back location, unspecified back pain laterality, unspecified chronicity        Date of Injury/Onset:05/2021  Date of Evaluation: 05/12/2022  Referring Pracitioner: Claudean Kinds  Certification Dates: 05/12/2022-08/04/22      ASSESSMENT:    77 y.o. male with a hx of COPD, CHF, DM, and sarcoidosis presents with shortness of breath and decreased strength/endurance which are limiting his ability to perform even basic mobility tasks. In addition, the patient reports a flare-up of sciatica, which is also affecting his tolerance for walking and movement. His scores on the TUG and 5 Times Sit to Stand tests indicate increased risk for falls as well as functional LE weakness. The patient would likely benefit from using an assistive device to improve stability and ambulation tolerance.  Patient will benefit from skilled Physical Therapy services for gait, strength, endurance, flexibility training; postural re-ed; assistive device education/training; breathing re-training; HEP development; and education.     Problem List: decreased UE and LE strength, impaired posture, deconditioning, pain, impaired ambulation, impaired transfers, and impaired functional mobility    Prognosis:  Fair   Positive Indicators: good caregiver/family support   Negative Indicators: chronicity of condition, multiple co-morbidities, previous treatment without benefit, and overall health status    Personal Factors/Comorbidities Present: Sciatica, congestive heart failure, recent falls, diabetes, HTN, knee OA, sarcoidosis, anemia    Examination of Body Systems: posture, gait, strength, balance    Clinical Decision Making: High Complexity Eval Code:  Data from the patient???s history indicates 3 or more personal factors or comorbidities that will affect the treatment of the current problem. Examination of body systems reveals 4 or more body structures, functions, activity limitations and/or participation restrictions and the clinical presentation is unstable. The results of a standardized functional test or outcome measure indicates the clinical decision making was of high complexity.      Goals    Patient/Family Goals: Be able to breathe better.      Short Term Goals:  In 3 weeks:  Patient will be able to properly demonstrate current HEP independently x1 in clinic to build upon functional gains in PT.   Patient will ambulate at least 100 feet over level surfaces with LRAD mod indep in order to improve community mobility.     Long Term Goals:  6 weeks:  Patient will be able to properly demonstrate current HEP independently x1 in clinic to build upon functional gains in PT.   Patient will improve his score on the TUG from 16.7 seconds to <=14 seconds to indicate decreased falls risk and improved mobility.  Patient will improve his time on the 5-Times Sit-to-Stand test from 19.3 seconds with UE support and partial stand to <=15 seconds with UE support and full stand as a sign of improved LE strength.  Patient will ambulate at least 200 feet over level surfaces with LRAD mod indep in order to improve community mobility.   Patient will ambulate up and down 3 stairs with bilat rails and step-to pattern, mod indep in order to improve his ability to navigate community barriers.    PLAN/Recommendations:  Next session plan: Try rollator to determine if pt would benefit from device at home; initiate seated aerobic/strength training; initial HEP    Frequency/Duration: 2 x a week for 6 weeks  Planned Interventions: Manual Therapy  Gait Training  Therapeutic Activites  Neuromuscular Education  Self-Care  Physical Performance Test  Therapeutic exercise  Balance training  Postural exercises/education  Body mechanics/education  Education  Dry needling if appropriate    PT DME/Equipment Recs: Assistive Device for ambulation    Communication/consultation with other professionals:  medicare Cert/POC sent to referring practitioner    Home Exercise Program:   TBD    SUBJECTIVE:  Pt states:   - He is having a lot of sciatica and can only walk a short distance right now. Went to a pain doctor and got shots, which didn't help.  - Did PT at hospital-based clinic in [place], but had to stop due to pain.  - Says that he can only walk about 10 feet because he gets really short of breath and his back hurts.   - He is really low on iron and needs to get an infusion.     Reason for Referral/History of Present Condition/Onset of injury/exacerbation:   77 y.o. male with a hx of COPD, CHF, diabetes, and sarcoidosis presents to the [facility] with shortness of breath and limited mobility.    Pt accompanied by Lynwood Dawley, his partner.     Last Fall: about 1 month ago; would fall out of nowhere (legs felt very weak), normally after getting up from a sitting or lying down position   Precautions: falls; cardiac hx; anemia  Red Flags:  none    Prior Functional Status: Last was able to move around without much difficulty about 1 year ago. Started using supplemental O2 about 10-12 years. Ambulates without equipment.    Current Functional Status: Ambulates without a device.  Currently used Equipment: Has a cane at home, but doesn't feel it was helpful when he tried using it. Uses a scooter at the store. Uses 2 L supplemental O2 day and night.   Previous Treatment: Hx of outpatient PT (finished about 6-12 months ago) in [place]. PT was not helpful.   Employment/Recreation:   - He is able to drive.  - No current exercise. Has access to gym equipment for free and has [employer] access to [employer] (with pool).   - Enjoys playing video games.     Social History: Lives with partner in [place] home (with chair lift) with 3 STE through the front and 2 STE through the garage.   Caregiver availability, capability, willingness: n/a  Independent w/ ADLs?: yes, but needs more time    Patient???s communication preference: Verbal, Written, and Visual    Barriers to Learning:  none    Recent Procedures/Tests/Findings  Reviewed MD notes.    Past Medical History:   Diagnosis Date    Allergic     CHF (congestive heart failure) (CMS-HCC)     COPD (chronic obstructive pulmonary disease) (CMS-HCC)     Diabetes type 2, controlled (CMS-HCC)     HLD (hyperlipidemia)     HTN (hypertension)     Neuropathy     OA (osteoarthritis) of knee     PAF (paroxysmal atrial fibrillation) (CMS-HCC)     Sarcoidosis      Family History   Problem Relation Age of Onset    Melanoma Neg Hx     Basal cell carcinoma Neg Hx     Squamous  cell carcinoma Neg Hx        No past surgical history on file.   Allergies   Allergen Reactions    Rosuvastatin Anaphylaxis     felt bad    Ace Inhibitors Anxiety    Lisinopril Other (See Comments)     Other reaction(s): Mental Status Change, Cough    Brinzolamide-Brimonidine Itching     And burning  And burning          Social History     Tobacco Use    Smoking status: Former     Current packs/day: 0.00     Types: Cigarettes     Quit date: 1971     Years since quitting: 53.0    Smokeless tobacco: Never   Substance Use Topics    Alcohol use: Not Currently      Current Outpatient Medications   Medication Sig Dispense Refill    albuterol sulfate 90 mcg/actuation AePB Inhale.      amlodipine (NORVASC) 2.5 MG tablet Take 1 tablet (2.5 mg total) by mouth daily. 90 tablet 3    atorvastatin (LIPITOR) 20 MG tablet Take 1 tablet (20 mg total) by mouth daily. 90 tablet 3    buPROPion (WELLBUTRIN SR) 100 MG 12 hr tablet Take 1 tablet (100 mg total) by mouth in the morning. 60 tablet 2    celecoxib (CELEBREX) 200 MG capsule Take 1 capsule (200 mg total) by mouth two (2) times a day as needed for pain. 180 capsule 3    clobetasoL (TEMOVATE) 0.05 % ointment Apply topically Two (2) times a day. Use on psoriasis until smooth. 60 g 1    DULoxetine (CYMBALTA) 60 MG capsule Take 1 capsule (60 mg total) by mouth daily. 90 capsule 3    edoxaban (SAVAYSA) 60 mg tablet Take 1 tablet (60 mg total) by mouth daily. 90 tablet 3    empagliflozin (JARDIANCE) 10 mg tablet TAKE 1 TABLET BY MOUTH DAILY 90 tablet 1    empty container Misc Use as directed to dispose of [employer]. 1 each 2    furosemide (LASIX) 80 MG tablet Take 1 tablet (80 mg total) by mouth daily. (Patient taking differently: Take 0.5 tablets (40 mg total) by mouth daily.) 90 tablet 3    gabapentin (NEURONTIN) 800 MG tablet Take 1 tablet (800 mg total) by mouth Three (3) times a day. (Patient taking differently: Take 1 tablet (800 mg total) by mouth two (2) times a day.) 270 tablet 0    hydroCHLOROthiazide (HYDRODIURIL) 12.5 MG tablet Take 1 tablet (12.5 mg total) by mouth daily. 90 tablet 3    hydrocortisone 2.5 % cream Apply topically Two (2) times a day. Apply behind your ears. 30 g 1    losartan (COZAAR) 50 MG tablet Take 1 tablet (50 mg total) by mouth daily.      metFORMIN (GLUCOPHAGE) 1000 MG tablet Take 0.5 tablets (500 mg total) by mouth in the morning and 0.5 tablets (500 mg total) in the evening. Take with meals. 90 tablet 3    mirtazapine (REMERON) 15 MG tablet Take 1 tablet (15 mg total) by mouth nightly. 90 tablet 3    ONETOUCH VERIO TEST STRIPS Strp Use to check blood sugar twice daily as directed. 100 strip 3    risankizumab-rzaa (SKYRIZI) 150 mg/mL PnIj Inject the contents of 1 pen (150mg ) under the skin at weeks 0 and 4 as loading doses. 2 mL 0    risankizumab-rzaa 150 mg/mL PnIj Inject the  contents of 1 pen (150 mg total) under the skin Every three (3) months. 2 mL 1    ROCKLATAN 0.02-0.005 % Drop Administer 1 drop to both eyes nightly.      semaglutide (OZEMPIC) 1 mg/dose (4 mg/3 mL) PnIj injection Inject 1 mg under the skin every seven (7) days. 9 mL 3    sotalol (BETAPACE) 80 MG tablet Take 1 tablet (80 mg total) by mouth two (2) times a day. 180 tablet 3    tamsulosin (FLOMAX) 0.4 mg capsule Take 1 capsule (0.4 mg total) by mouth daily. (Patient not taking: Reported on 04/21/2022) 90 capsule 3    tizanidine (ZANAFLEX) 2 MG tablet TAKE 1 TABLET BY MOUTH EVERY 8 HOURS AS NEEDED FOR BACK SPASMS 30 tablet 10    TRELEGY ELLIPTA 100-62.5-25 mcg inhaler       triamcinolone (KENALOG) 0.1 % cream Apply topically two (2) times a day. For your legs. 454 g 2    umeclidinium (INCRUSE ELLIPTA) 62.5 mcg/actuation inhaler Inhale 1 puff daily.       No current facility-administered medications for this visit.            OBJECTIVE :    Vitals:   BP: 119/71  HR: 77  SpO2: 95% on 2L O2    Posture:    Sitting: rounded shoulders and decreased lumbar lordosis   Standing: foward flexed posture  Skin assessment/Edema:  Visible skin is intact    Pain:  Current:  4/10 bilat back/sacrum and sciatic pain in bilat legs down to feet (L>R)  Max:  10/10  Pain Aggravated By: walking, turning in bed, scooting up in bed, prolonged sitting  Pain Relieved By: laying down or sitting down; sitting in seat of car ; Celebrex  Hurt his L knee the last time he fell.    Sensation:  Patient is able to identify light touch stimuli in bilateral UE and LE with 100% accuracy.    Vision:  No visual impairment. Wears glasses.    Vestibular:    No dizziness, but sometimes things go blank.  Does have constant tinnitus.     Range of Motion:   WFL in bilat LE and UE    Strength/MMT:  UE MMT   Shoulder flex and abd: 4/5 bilat  Elbow flex and ext: 4/5 bilat      LE MMT   LE MMT Left Right   Hip flex:  (L2) 3/5 3/5   Knee ext:  (L3) 4/5 4/5   Knee flex: (S2) 4/5 4/5   Ankle DF:  (L4) 5/5 5/5        Transfers:    Sit<>Stand: requires UE support     Balance:  Tested functionally with TUG    Gait Analysis:   The patient ambulates 20 feet without an assistive device with CGA. The patient demonstrates the following gait abnormalities: forward flexed posture, antalgic pattern, decreased step length and foot clearance, shortness of breath.     Functional Tests/Outcome Measures:     Timed Up and Go (TUG): 16.7 seconds (without AD).  >13.5 sec indicates increased risk of falling in community-dwelling older adults.      Normative Reference Values by Age  Age Time (in seconds)   60 - 69 years  8.1 (7.1 - 9.0)    70 - 79 years  9.2 (8.2 - 10.2)    80 - 99 years  11.3 (10.0 - 12.7)         Five Times Sit-to-Stand (  FTSTS): 19.3 seconds with UE assist; does not come to complete stand (hands stay on armrests of chair)  >12 sec indicates further falls risk assessment is recommended  >15 sec indicates increased risk of falling in community-dwelling older adults.    Normative Reference Values by Age  Age Time (in seconds)   60 - 69 years  11.4 sec   70 - 79 years  12.6 sec   80 - 89 years  14.8 sec         Patient Education: Throughout evaluation patient educated regarding the following: Role of PT in Rehabilitation, importance of therapy, equipment recommendations, posture, body mechanics, body awareness, and treatment plan. Patient demonstrated and verbalized agreement and understanding.        Total Time: 45 min   PT Evaluation: 40 min   Treatment Rendered:    Therapeutic Activities: 5 mins - Posture Correction  - Recommended pt use lumbar roll or backrest in office chair to improve posture and to help with back pain.  - Discussed ortho vs pulmonary PT.  - Discussed plan to transition to independent exercise program.  - Discussed process for getting assistive equipment.     I attest that I have reviewed the above information.  Signed: Eulas Post, PT  05/12/2022 10:53 AM

## 2022-05-15 ENCOUNTER — Ambulatory Visit: Payer: 59

## 2022-05-17 ENCOUNTER — Ambulatory Visit: Payer: 59

## 2022-05-19 ENCOUNTER — Ambulatory Visit
Admit: 2022-05-19 | Discharge: 2022-05-20 | Payer: MEDICARE | Attending: Student in an Organized Health Care Education/Training Program | Primary: Student in an Organized Health Care Education/Training Program

## 2022-05-19 DIAGNOSIS — R449 Unspecified symptoms and signs involving general sensations and perceptions: Principal | ICD-10-CM

## 2022-05-19 DIAGNOSIS — R209 Unspecified disturbances of skin sensation: Principal | ICD-10-CM

## 2022-05-19 DIAGNOSIS — Z8249 Family history of ischemic heart disease and other diseases of the circulatory system: Principal | ICD-10-CM

## 2022-05-19 DIAGNOSIS — G9389 Other specified disorders of brain: Principal | ICD-10-CM

## 2022-05-19 DIAGNOSIS — R296 Repeated falls: Principal | ICD-10-CM

## 2022-05-19 NOTE — Unmapped (Signed)
ASSESSMENT        Paul Velez is a 77 y.o. male who presents for evaluation of abnormal head sensations.    He describes an abnormal sensation, mainly on the left aspect of his head but also the crown of his head, and he states the sensation makes him not want to be alone. He denies any overt headache or pain associated with the symptoms, and he further denies other focal neurological findings. His physical examination is slightly suggestive of an asymmetrical sensory deficit affecting the right side. There is a family history of ruptured intracranial aneurysm, and this is the patient's biggest concern. However, given the transient nature of the symptoms, and the increased anxiety/psychiatric features of the event, atypical seizure aura is also considered on the differential.    The patient also has evidence today of a peripheral neuropathy, likely from his long history of type 2 diabetes mellitus. His neuropathy is likely contributing to his history of falls. He also has neuropathic pain which is currently well controlled on gabapentin twice daily.         PLAN     MRI brain with and without contrast and MRA head without contrast to further evaluate for space occupying lesions, inflammation, and aneurysms as a cause for the patient's symptoms.  If the above testing is normal, consider ambulatory EEG monitoring to evaluate for atypical seizures. Will not start any ASM at this time  Patient to return to the outpatient [facility] neurology clinic in approximately 3 months      I personally spent 60 minutes face-to-face and non-face-to-face in the care of this patient, which includes all pre, intra, and post visit time on the date of service.      Orders Placed This Encounter   Procedures    MRI brain with and without contrast    MRA Head Wo Contrast       Laurian Brim, DO  Assistant Professor  Department of Neurology  General Neurology and Neuromuscular Divisions  [place] [facility]         HISTORY Chief Concern: Paul Velez is a 77 y.o. male who comes for consultation and evaluation from Criselda Peaches Lidia Collum, MD regarding Establish Care      History of Present Illness: The history is obtained from the medical record, patient, and family, including his partner, who is here with the patient today.     He states he presents for his brain. He states he can be sitting still, he can get this sudden sensation of school buses running in his brain with the symptoms lasting a very short period of time (a few seconds). He states it is like an alarm telling you to do something. He states the symptoms are not painful. The symptoms are mainly in the top of his head but sometimes the posterior left aspect of the head. He states the symptom makes him not want to be alone and makes him not want to be where he is at. When he gets this sensation, he does not have any associated symptoms including vision changes, speech changes, or extremity numbness or tingling. He states the symptoms do not come on every day, but it can happen multiple times in the day. He states he is concerned due to the intermittent nature of the symptoms but perhaps the frequency of them as well. He would not say that this symptom is a headache and states he has never had any headaches. He is concerned about  his head symptoms because his mother died from a ruptured aneurysm.     He does state he has a history of frequent falls. He states at one time, he was falling more often. He has fallen out of the bed a while ago and hit his head without loss of consciousness. He is not falling often now, but states the falling has slowed down because he has a lot of things at home now that he can hold onto and prevent himself from falling. His last fall was early December 2023. At home, he does have a stair lift. He does not use a cane or walker.     He does have a history of neuropathy, and states he has had neuropathy for approximately 10-20 years. He states his neuropathy symptoms are currently in his feet and states it can include numbness, tingling, and burning. He is currently taking gabapentin twice daily which he states really helps his neuropathic pain, especially at night. His last hemoglobin A1c was 7.2.    He is also having sciatica more on the right side but does affect both legs. He states the pain starts in his buttocks and goes down the backs of his legs down to his ankles. The symptoms last for a short period of time and resolve with rest. He has recently started taking CBD gummies for this pain, as gabapentin doesn't seem to help. He states he has seen pain management who has given him injections which do not seem to help. He is not currently seeing pain management. He has been seen by neurosurgery who did not recommend surgery.       Outside records and prior workup has been reviewed (see Media section).       Past Medical History: He  has a past medical history of Allergic, CHF (congestive heart failure) (CMS-HCC), COPD (chronic obstructive pulmonary disease) (CMS-HCC), Diabetes type 2, controlled (CMS-HCC), HLD (hyperlipidemia), HTN (hypertension), Neuropathy, OA (osteoarthritis) of knee, PAF (paroxysmal atrial fibrillation) (CMS-HCC), and Sarcoidosis.    Family History: His family history is not on file.    Social history: He  reports that he quit smoking about 53 years ago. His smoking use included cigarettes. He has never used smokeless tobacco. He reports that he does not currently use alcohol.     Medications: He is on the following medications: has a current medication list which includes the following prescription(s): albuterol sulfate, amlodipine, atorvastatin, bupropion, celecoxib, clobetasol, duloxetine, edoxaban, jardiance, empty container, furosemide, gabapentin, hydrochlorothiazide, hydrocortisone, losartan, metformin, mirtazapine, onetouch verio test strips, rocklatan, ozempic, sotalol, tamsulosin, tizanidine, trelegy ellipta, triamcinolone, and umeclidinium.    Medication Reconciliation: In conjunction with entering the patient's initial or transfer medications, I performed medication reconciliation based on information available to me at the time.    Allergies: He is allergic to rosuvastatin, ace inhibitors, lisinopril, and brinzolamide-brimonidine.    Review of Systems: 11+ review of systems was reviewed. Please see scanned document for details. Significant positives and negatives are detailed in the HPI.     Data Review:     Laboratory Workup  Normal/negative vitamin B12, hepatitis C antibody    Hemoglobin A1c: 7.2      Imaging Workup  MRI Cervical Spine without Contrast (02/15/2018):  At C4-5 there is a minimal broad-based disc bulge. Moderate left and mild right facet arthropathy. Mild right foraminal stenosis. Moderate left foraminal stenosis.  At C5-6 there is a broad-based disc bulge with a small right paracentral/foraminal disc protrusion. Bilateral uncovertebral degenerative changes. Severe right  foraminal stenosis. Moderate left foraminal stenosis.  At C6-7 there is a broad-based disc bulge. Mild bilateral foraminal stenosis.  At T1-2 there is a mild broad-based disc bulge. Moderate bilateral facet arthropathy. Moderate right foraminal stenosis. No significant left foraminal stenosis.    MRI Lumbar Spine without Contrast (08/27/2021):  L3-4: Mild chronic degenerative changes without visible neural compression or progression.  L4-5: Disc bulge. Facet degeneration and hypertrophy. Stenosis of the lateral recesses and foramina could possibly cause neural compression. Foraminal stenosis slightly worse on the right. Slight worsening since 2019. Small synovial cyst previously seen is no longer visible.  L5-S1: Disc and facet degeneration. Bilateral foraminal stenosis could compress either or both L5 nerves. Similar appearance to the prior study.      Procedure Workup  None pertinent.         EXAMINATION     Vital Signs :His height is 177.8 cm (5' 10) and weight is 89.4 kg (197 lb). His blood pressure is 157/71 and his pulse is 98.   Pain score-There were no vitals filed for this visit./10    Physical Examination:  General: Well developed, well-nourished in no acute distress.  HEENT: normocephalic, atraumatic, no oral lesions or tongue lacerations, mucous membranes are moist  Ext: No edema, nontender, no ulcer. Warm, well perfused. There is no evidence of pes cavus or hammer toes.   Skin: No significant rashes.  Neck: Supple.     Neurological Examination:    Mental Status:   Patient is awake, alert, and appropriately oriented. Attentive to examiner, cooperative with exam. Language is fluent with normal comprehension and repetition. Fund of knowledge is normal. Memory is intact. Patient is able to give details of his own history.  Speech is normal without any evidence of dysarthria.    Cranial nerves:    II: Visual fields are full. Pupils are equal, round, and reactive to light.  III, IV and VI: extraocular movements are full. There is no nystagmus.  V: Facial sensation is intact and symmetric.  VII: The face is strong and symmetric.  VIII: Hearing is intact to finger rub bilaterally.  IX and X: Soft palate elevates symmetrically in the midline.  XI: Shoulder shrug and sternocleidomastoids are strong.  XII: Tongue is midline, normal movement, no atrophy or fasciculations.    Motor Examination:   Muscle tone is normal.  There is no evidence of atrophy or fasciculations.    Strength evaluation:      MUSCLE Right (MRC Grade) Left (MRC Grade)   Neck Flexor 5    Neck Extensor 5    Arm abductor 5 5   Elbow extensor 5 5   Elbow flexor  5 5   Wrist extensor 5 5   Wrist flexor 5 5   Finger flexor  5 5   Finger extensor 5 5   Finger abduction 5 5   Thumb abduction 5 5   Hip flexor  4 4+   Hip extensor      Hip abduction 5 5   Hip adduction 5 5   Knee extensor  5 5   Knee flexor  5 5   Ank Dorsiflexor  5 5   Ank Plantarflexor  5 5   Foot Inversion 5 5 Foot Eversion 5 5   Toe Extensors 4+ 4+   Toe Flexors 4+ 4+   Other       Reflexes:    Reflexes Right Left Comments   Biceps 2+ 2+    Triceps 2+  2+    Brachioradialis 2+ 2+    Patella 2+ 2+    Achilles 0 0    Plantar Response Flexor Flexor    Hoffman's Sign Negative Negative      Sensory:  Light touch sensation is decreased in the right leg distal to the knee compared to the left, decreased in the right fingers compared to the left  Pinprick sensation is decreased in the right leg below the ankle, in the left leg distal to the knee, normal in the fingertips  Vibration sense is absent at the big toes, normal at the ankles, normal at the fingertips  Proprioception is decreased at the right big toe and right ankle, normal at the left big toe with difficulty    Coordination and Gait:  Finger to nose is intact without dysmetria. There is no evidence of tremor. Rapid alternating movements are intact without dysrhythmia. Fine motor control is normal.     Romberg sign is present.     Gait is wide-based and steady, with normal arm swing. Patient tends to hyperextend knees when walking.

## 2022-05-19 NOTE — Unmapped (Signed)
You were seen in the outpatient neurology office today for abnormal head sensations as well as balance problems.    With your head sensations, I think we should rule out tumor, stroke, and aneurysm (given your family history of aneurysm in your mom). They should call you to schedule this testing.    If this testing comes back with no obvious cause for your symptoms, I will plan on getting an EEG to look for evidence of atypical seizure activity as a cause for these symptoms.

## 2022-05-22 ENCOUNTER — Ambulatory Visit: Payer: 59

## 2022-05-24 ENCOUNTER — Ambulatory Visit: Payer: 59

## 2022-05-29 ENCOUNTER — Ambulatory Visit: Payer: 59

## 2022-05-30 NOTE — Unmapped (Signed)
PULMONARY OUTPATIENT PHYSICAL THERAPY   DAILY NOTE    Patient Name: Paul Velez  Date of Birth:12/17/1945  Date: 05/30/2022  Session Number:  2  Therapy Diagnosis:   Encounter Diagnoses   Name Primary?    Chronic obstructive pulmonary disease, unspecified COPD type (CMS-HCC) Yes    Impaired functional mobility, balance, gait, and endurance     Back pain, unspecified back location, unspecified back pain laterality, unspecified chronicity        Date of Injury/Onset:05/2021  Date of Evaluation: 05/12/2022  Referring Pracitioner: Claudean Kinds  Certification Dates: 05/12/2022-08/04/22      ASSESSMENT:    77 y.o. male with a hx of COPD, CHF, DM, and sarcoidosis presents with shortness of breath and decreased strength/endurance which are limiting his ability to perform even basic mobility tasks. In addition, the patient reports a flare-up of sciatica, which is also affecting his tolerance for walking and movement.  Today's session focused on HEP development and ambulation with the rollator. The patient demonstrated improved gait speed and ambulation tolerance when using the rollator. Will reach out to his PCP to request an rx.    Patient will benefit from skilled Physical Therapy services for gait, strength, endurance, flexibility training; postural re-ed; assistive device education/training; breathing re-training; HEP development; and education.     Problem List: decreased UE and LE strength, impaired posture, deconditioning, pain, impaired ambulation, impaired transfers, and impaired functional mobility      Goals    Patient/Family Goals: Be able to breathe better.      Short Term Goals:  In 3 weeks:  Patient will be able to properly demonstrate current HEP independently x1 in clinic to build upon functional gains in PT.   Patient will ambulate at least 100 feet over level surfaces with LRAD mod indep in order to improve community mobility.     Long Term Goals:  6 weeks:  Patient will be able to properly demonstrate current HEP independently x1 in clinic to build upon functional gains in PT.   Patient will improve his score on the TUG from 16.7 seconds to <=14 seconds to indicate decreased falls risk and improved mobility.  Patient will improve his time on the 5-Times Sit-to-Stand test from 19.3 seconds with UE support and partial stand to <=15 seconds with UE support and full stand as a sign of improved LE strength.  Patient will ambulate at least 200 feet over level surfaces with LRAD mod indep in order to improve community mobility.   Patient will ambulate up and down 3 stairs with bilat rails and step-to pattern, mod indep in order to improve his ability to navigate community barriers.    PLAN/Recommendations:   Next session plan: Ambulation with rollator; seated aerobic/strength training; review HEP    Frequency/Duration: 2 x a week for 6 weeks  Planned Interventions: Manual Therapy  Gait Training  Therapeutic Activites  Neuromuscular Education  Self-Care  Physical Performance Test  Therapeutic exercise  Balance training  Postural exercises/education  Body mechanics/education  Education  Dry needling if appropriate    PT DME/Equipment Recs: Assistive Device for ambulation    Communication/consultation with other professionals:  medicare Cert/POC sent to referring practitioner    Home Exercise Program ([id number]), 2x/day:   LAQ x20  Marching x20  Seated clamshell with green t-band (L3) x20  Seated row with L3 band x20    SUBJECTIVE:  Pt states:   - He had a hard time getting down to the car today. Back pain  is still limiting walking.    Reason for Referral/History of Present Condition/Onset of injury/exacerbation:   77 y.o. male with a hx of COPD, CHF, diabetes, and sarcoidosis presents to the [facility] with shortness of breath and limited mobility.    Pt accompanied by Lynwood Dawley, his partner.     Last Fall: about 1 month ago; would fall out of nowhere (legs felt very weak), normally after getting up from a sitting or lying down position   Precautions: falls; cardiac hx; anemia  Red Flags:  none    Prior Functional Status: Last was able to move around without much difficulty about 1 year ago. Started using supplemental O2 about 10-12 years. Ambulates without equipment.    Current Functional Status: Ambulates without a device.  Currently used Equipment: Has a cane at home, but doesn't feel it was helpful when he tried using it. Uses a scooter at the store. Uses 2 L supplemental O2 day and night.   Previous Treatment: Hx of outpatient PT (finished about 6-12 months ago) in [place]. PT was not helpful.   Employment/Recreation:   - He is able to drive.  - No current exercise. Has access to gym equipment for free and has [employer] access to [employer] (with pool).   - Enjoys playing video games.     Social History: Lives with partner in [place] home (with chair lift) with 3 STE through the front and 2 STE through the garage.   Caregiver availability, capability, willingness: n/a  Independent w/ ADLs?: yes, but needs more time    Patient???s communication preference: Verbal, Written, and Visual    Barriers to Learning:  none    Recent Procedures/Tests/Findings  Reviewed MD notes.      OBJECTIVE :    Vitals:   BP: 114/68  HR: 85  SpO2: 98% on 2L O2    Pain:  Current:  3/10 bilat back/sacrum and sciatic pain in bilat legs down to feet (L>R)  Max:  10/10  Pain Aggravated By: walking, turning in bed, scooting up in bed, prolonged sitting  Pain Relieved By: laying down or sitting down; sitting in seat of car ; Celebrex  Hurt his L knee the last time he fell.    Therapeutic exercise: 25 min    Pt instructed in exercises for HEP, with demo and VC for techinque:  LAQ x20  Marching x20  Seated clamshell with green t-band (L3) x20  Seated row with L3 band x20    NuStep, L3 x4 min, profile 2  SpO2 low 91% on 2L O2  HR 105  Discontinued due to c/o L knee pain.    The patient was provided with an updated written home exercise program, which was reviewed with the patient. The patient verbalized understanding of all exercises and instructions.    Therapeutic Activity: 15 min    Ambulation with bariatric rollator 2 x70'  Discussed pros of rollator with pt and partner. Discussed process for getting rollator for home. Pt would like to proceed.      Patient Education: Throughout evaluation patient educated regarding the following: Role of PT in Rehabilitation, importance of therapy, equipment recommendations, posture, body mechanics, body awareness, and treatment plan. Patient demonstrated and verbalized agreement and understanding.        Total Time: 40 min   Treatment Rendered:  see above    I attest that I have reviewed the above information.  Signed: Eulas Post, PT  05/30/2022 1:19 PM

## 2022-05-31 ENCOUNTER — Ambulatory Visit: Payer: 59

## 2022-06-01 DIAGNOSIS — R296 Repeated falls: Principal | ICD-10-CM

## 2022-06-03 ENCOUNTER — Ambulatory Visit: Admit: 2022-06-03 | Discharge: 2022-06-04 | Payer: MEDICARE

## 2022-06-03 DIAGNOSIS — M5430 Sciatica, unspecified side: Principal | ICD-10-CM

## 2022-06-03 DIAGNOSIS — M25562 Pain in left knee: Principal | ICD-10-CM

## 2022-06-03 DIAGNOSIS — G8929 Other chronic pain: Principal | ICD-10-CM

## 2022-06-03 DIAGNOSIS — M25561 Pain in right knee: Principal | ICD-10-CM

## 2022-06-03 NOTE — Unmapped (Signed)
Assessment/Plan:        Diagnoses and all orders for this visit:    Sciatic leg pain  -     Ambulatory referral to Pain Clinic; Future    Chronic pain of both knees  -     Ambulatory referral to Pain Clinic; Future      [Clarification:   Other Orders above = previous meds, which should be continued.]      I have advised he should reach out to [facility] clinic for 1 more med check while he waits for [facility] pain referral for second opinion. There is no pain med I can give him today.    He knows the walking may help    HOPI:      Patient ID:  Paul Velez is a 77 y.o. male here in a wheel chair and on O2 for COPD. Says he does not use a wheel chair at home. But having chronic sciatic pain that seems to be worsening. He also says he has fallen 5 times in the last 2 months and now has bilateral knee pain. He is seeing [facility] PT for repiratory rehab. Has a brain MRI this Monday. Has seen  pain clinic for shots in spine but no help. He wants to go somewhere else for pain. He says they told him he has spinal stenosis. He has just gotten a rollator for home use and admits he is walking better and more. PT also works on that.   He is on gabapentin 800 TID, celebrex and remeron.       Past Medical/Surgical History:  Past Medical History:   Diagnosis Date    Allergic     CHF (congestive heart failure) (CMS-HCC)     COPD (chronic obstructive pulmonary disease) (CMS-HCC)     Diabetes type 2, controlled (CMS-HCC)     HLD (hyperlipidemia)     HTN (hypertension)     Neuropathy     OA (osteoarthritis) of knee     PAF (paroxysmal atrial fibrillation) (CMS-HCC)     Sarcoidosis      No past surgical history on file.    Allergies:  He is allergic to rosuvastatin, ace inhibitors, lisinopril, and brinzolamide-brimonidine.    Current Medications:  Current Outpatient Medications   Medication Sig Dispense Refill    albuterol sulfate 90 mcg/actuation AePB Inhale.      amlodipine (NORVASC) 2.5 MG tablet Take 1 tablet (2.5 mg total) by mouth daily. 90 tablet 3    atorvastatin (LIPITOR) 20 MG tablet Take 1 tablet (20 mg total) by mouth daily. 90 tablet 3    buPROPion (WELLBUTRIN SR) 100 MG 12 hr tablet Take 1 tablet (100 mg total) by mouth in the morning. 60 tablet 2    celecoxib (CELEBREX) 200 MG capsule Take 1 capsule (200 mg total) by mouth two (2) times a day as needed for pain. 180 capsule 3    clobetasoL (TEMOVATE) 0.05 % ointment Apply topically Two (2) times a day. Use on psoriasis until smooth. 60 g 1    DULoxetine (CYMBALTA) 60 MG capsule Take 1 capsule (60 mg total) by mouth daily. 90 capsule 3    edoxaban (SAVAYSA) 60 mg tablet Take 1 tablet (60 mg total) by mouth daily. 90 tablet 3    empagliflozin (JARDIANCE) 10 mg tablet TAKE 1 TABLET BY MOUTH DAILY 90 tablet 1    empty container Misc Use as directed to dispose of [employer]. 1 each 2    furosemide (LASIX)  80 MG tablet Take 1 tablet (80 mg total) by mouth daily. 90 tablet 3    gabapentin (NEURONTIN) 800 MG tablet Take 1 tablet (800 mg total) by mouth Three (3) times a day. (Patient taking differently: Take 1 tablet (800 mg total) by mouth two (2) times a day.) 270 tablet 0    hydroCHLOROthiazide (HYDRODIURIL) 12.5 MG tablet Take 1 tablet (12.5 mg total) by mouth daily. 90 tablet 3    hydrocortisone 2.5 % cream Apply topically Two (2) times a day. Apply behind your ears. 30 g 1    losartan (COZAAR) 50 MG tablet Take 1 tablet (50 mg total) by mouth daily.      metFORMIN (GLUCOPHAGE) 1000 MG tablet Take 0.5 tablets (500 mg total) by mouth in the morning and 0.5 tablets (500 mg total) in the evening. Take with meals. (Patient taking differently: Take 0.5 tablets (500 mg total) by mouth in the morning and 0.5 tablets (500 mg total) in the evening. Take with meals. Take 1 tablet bid.Marland Kitchen) 90 tablet 3    mirtazapine (REMERON) 15 MG tablet Take 1 tablet (15 mg total) by mouth nightly. 90 tablet 3    ONETOUCH VERIO TEST STRIPS Strp Use to check blood sugar twice daily as directed. 100 strip 3 ROCKLATAN 0.02-0.005 % Drop Administer 1 drop to both eyes nightly.      semaglutide (OZEMPIC) 1 mg/dose (4 mg/3 mL) PnIj injection Inject 1 mg under the skin every seven (7) days. 9 mL 3    sotalol (BETAPACE) 80 MG tablet Take 1 tablet (80 mg total) by mouth two (2) times a day. 180 tablet 3    tamsulosin (FLOMAX) 0.4 mg capsule Take 1 capsule (0.4 mg total) by mouth daily. 90 capsule 3    tizanidine (ZANAFLEX) 2 MG tablet TAKE 1 TABLET BY MOUTH EVERY 8 HOURS AS NEEDED FOR BACK SPASMS 30 tablet 10    TRELEGY ELLIPTA 100-62.5-25 mcg inhaler       triamcinolone (KENALOG) 0.1 % cream Apply topically two (2) times a day. For your legs. 454 g 2    umeclidinium (INCRUSE ELLIPTA) 62.5 mcg/actuation inhaler Inhale 1 puff daily.       No current facility-administered medications for this visit.                   Physical Exam:     BP 120/70 (BP Site: L Arm, BP Position: Sitting, BP Cuff Size: Large)  - Pulse 72  - Temp 36.8 ??C (98.3 ??F) (Oral)  - Resp 16  - SpO2 95% Comment: w/ 2L of oxygen  General appearance: alert and no distress  Extremities:  No obvious redness or abrasions. Describes medial joint line area pain on both knees. His knees seem enlarged by DJD changes vs he is thin. No point tenderness  Skin: Skin color, texture, turgor normal. No rashes or lesions    Labs:  No results found for this or any previous visit (from the past 24 hour(s)).

## 2022-06-05 ENCOUNTER — Ambulatory Visit: Admit: 2022-06-05 | Discharge: 2022-06-06 | Payer: MEDICARE

## 2022-06-05 ENCOUNTER — Ambulatory Visit: Payer: 59

## 2022-06-05 MED ADMIN — gadobenate dimeglumine (MULTIHANCE) 529 mg/mL (0.1mmol/0.2mL) solution 17.88 mL: .1 mmol/kg | INTRAVENOUS | @ 16:00:00 | Stop: 2022-06-05

## 2022-06-06 ENCOUNTER — Ambulatory Visit (INDEPENDENT_AMBULATORY_CARE_PROVIDER_SITE_OTHER): Payer: 59

## 2022-06-06 ENCOUNTER — Ambulatory Visit: Payer: Self-pay

## 2022-06-06 ENCOUNTER — Ambulatory Visit
Admission: EM | Admit: 2022-06-06 | Discharge: 2022-06-06 | Disposition: A | Discharge status: Home / Self Care | Payer: 59 | Attending: Emergency Medicine | Admitting: Emergency Medicine

## 2022-06-06 DIAGNOSIS — G8929 Other chronic pain: Secondary | ICD-10-CM

## 2022-06-06 DIAGNOSIS — M25561 Pain in right knee: Secondary | ICD-10-CM | POA: Diagnosis not present

## 2022-06-06 DIAGNOSIS — M25562 Pain in left knee: Secondary | ICD-10-CM

## 2022-06-06 DIAGNOSIS — M5441 Lumbago with sciatica, right side: Secondary | ICD-10-CM | POA: Diagnosis not present

## 2022-06-06 DIAGNOSIS — M5442 Lumbago with sciatica, left side: Secondary | ICD-10-CM | POA: Diagnosis not present

## 2022-06-06 MED ORDER — OXYCODONE HCL 5 MG PO TABS: 5.0000 mg | ORAL_TABLET | Freq: Four times a day (QID) | ORAL | 0 refills | Status: AC | PRN

## 2022-06-06 MED ORDER — PREDNISONE 10 MG (21) PO TBPK: ORAL_TABLET | Freq: Every day | ORAL | 0 refills | Status: AC

## 2022-06-06 MED ORDER — BUPROPION HCL SR 100 MG TABLET,12 HR SUSTAINED-RELEASE
ORAL_TABLET | Freq: Every morning | ORAL | 10 refills | 0 days
Start: 2022-06-06 — End: ?

## 2022-06-06 NOTE — Discharge Instructions (Addendum)
X-ray of your knees shows arthritis  Due to your extent of pain you will need further evaluation and management, please reach out to the pain clinic for reassessment  Tomorrow begin prednisone every morning with food as directed, be mindful this medicine will temporarily increase your blood sugars, will go back down to your baseline once completed  May use oxycodone immediate release every 8 hours as needed for severe pain, be mindful this medication may make you feel drowsy  Continue to take all daily medications as directed  May use ice or heat over the affected areas  Massage as tolerated

## 2022-06-06 NOTE — ED Provider Notes (Signed)
[facility]    CSN: [id number] Arrival date & time: 06/06/22  1657      History   Chief Complaint Chief Complaint  Patient presents with   Knee Pain   Back Pain    HPI Mario [name] is a 77 y.o. male.   Patient presents for evaluation of his chronic low back and sciatica pain radiating to his buttock causing numbness.  Symptoms have been going for several months, endorses that they are worsening.  Endorses that his chronic bilateral knee pain has worsened over the last 2 weeks making it feel as if they will buckle and give out.  Symptoms worsen after fall where he landed directly onto the bilateral knees and left knee hitting a doorway, did not seek immediate evaluation after.  Endorses that approximately 1 month ago he received a steroid spinal injection which caused no relief of pain.  Taking daily gabapentin, Celebrex and Remeron.  Endorses he is a patient of the [facility] but denies signing contract.  Denies new numbness or tingling.    Past Medical History:  Diagnosis Date   Arthritis    hands, knees   Atrial fibrillation (HCC)    CHF (congestive heart failure) (HCC)    COPD (chronic obstructive pulmonary disease) (HCC)    Diabetes mellitus without complication (HCC)    Diabetic neuropathy (HCC)    Heart murmur    in past, mild 1969   Hypertension    resolved after weight loss and more activity   Neuromuscular disorder (HCC)    neuropathy   Neuropathy    Pulmonary embolism (HCC)    approx 2014   Shingles '99   Sleep apnea    CPAP   Wears dentures    full upper and lower    Patient Active Problem List   Diagnosis Date Noted   Spinal stenosis, lumbar region, with neurogenic claudication 10/06/2021   Sacroiliac joint pain 10/06/2021   Piriformis muscle pain 10/06/2021   Bilateral primary osteoarthritis of knee 06/11/2019   Chronic radicular lumbar pain 06/11/2019   Cervical facet joint syndrome 06/11/2019   Lumbar facet arthropathy  06/11/2019   Lumbar radiculopathy 06/11/2019   Chronic pain syndrome 06/11/2019   Lung nodule 02/09/2016   Exercise hypoxemia 12/28/2015   Chronic obstructive pulmonary disease (HCC) 03/16/2015   Nuclear sclerosis 03/16/2015   OSA (obstructive sleep apnea) 03/16/2015   Primary open angle glaucoma of both eyes, severe stage 03/16/2015   Type 2 diabetes mellitus without complication, without long-term current use of insulin (HCC) 03/16/2015   Arthralgia of left lower leg 10/29/2014   Osteoarthritis of left knee 09/10/2014   Type 2 diabetes mellitus with diabetic neuropathy (HCC) 08/04/2014   Cataract, nuclear sclerotic senile 04/01/2014   Combined forms of age-related cataract 04/01/2014   Keratitis sicca, bilateral (HCC) 04/01/2014   Primary open angle glaucoma 02/27/2014   Flat feet 01/29/2014   Foot pain, bilateral 01/29/2014   Tinea pedis of both feet 01/29/2014   Ptosis of both eyelids 11/25/2013   Type 2 diabetes mellitus without retinopathy (HCC) 11/25/2013   Onychomycosis 11/19/2013   Chronic systolic heart failure (HCC) 11/11/2013   Emphysema of lung (HCC) 11/11/2013   Essential hypertension 11/11/2013   Screen for colon cancer 11/05/2013   Cardiomyopathy (HCC) 10/08/2013   Systolic HF (heart failure) (HCC) 10/08/2013   Acute systolic CHF (congestive heart failure) (HCC) 09/16/2013   COPD (chronic obstructive pulmonary disease) (HCC) 09/16/2013   HTN (hypertension) 09/16/2013   Obesity 09/16/2013  PAF (paroxysmal atrial fibrillation) ([place]) 09/16/2013   Senile nuclear sclerosis 05/22/2013   Muscle weakness (generalized) 11/11/2009   Long term current use of anticoagulant therapy 06/17/2009   Pulmonary embolism ([place]) 06/17/2009   ED (erectile dysfunction) of organic origin 05/13/2003    Past Surgical History:  Procedure Laterality Date   CATARACT EXTRACTION W/PHACO Left 05/12/2019   Procedure: CATARACT EXTRACTION PHACO AND INTRAOCULAR LENS PLACEMENT (IOC) LEFTDIABETIC  ISTENT INJ 2.43  00:28.6;  Surgeon: Eulogio Bear, MD;  Location: [facility];  Service: Ophthalmology;  Laterality: Left;  Diabetic - oral meds sleep apnea   CATARACT EXTRACTION W/PHACO Right 06/02/2019   Procedure: CATARACT EXTRACTION PHACO AND INTRAOCULAR LENS PLACEMENT (IOC) ISTENT INJ  RIGHT DIABETIC 1.31  00:25.4;  Surgeon: Eulogio Bear, MD;  Location: [facility];  Service: Ophthalmology;  Laterality: Right;   COLONOSCOPY  08/30/2015   FINGER SURGERY Right    3'rd finger, right hand   KNEE SURGERY Right    screw in knee   WRIST SURGERY Left    metal removed       Home Medications    Prior to Admission medications   Medication Sig Start Date End Date Taking? Authorizing Provider  albuterol (PROVENTIL HFA;VENTOLIN HFA) 108 (90 Base) MCG/ACT inhaler Inhale 2 puffs into the lungs as needed. 11/23/15   [provider]  APPLE CIDER VINEGAR PO Take by mouth daily.    [provider]  atorvastatin (LIPITOR) 20 MG tablet Take 20 mg by mouth daily. 12/06/21   [provider]  buPROPion (WELLBUTRIN) 100 MG tablet Take 100 mg by mouth 2 (two) times daily.    [provider]  celecoxib (CELEBREX) 200 MG capsule Take 200 mg by mouth daily. 12/01/21   [provider]  DULoxetine (CYMBALTA) 60 MG capsule am 11/10/18   [provider]  edoxaban (SAVAYSA) 60 MG TABS tablet Take 60 mg by mouth daily.    [provider]  empagliflozin (JARDIANCE) 10 MG TABS tablet Take by mouth daily.    [provider]  furosemide (LASIX) 80 MG tablet Take 1 tablet by mouth 2 (two) times daily. 80 mg AM, 120 mg PM 08/23/15   [provider]  gabapentin (NEURONTIN) 300 MG capsule Take 800 mg by mouth 3 (three) times daily. 07/17/15   [provider]  gentamicin cream (GARAMYCIN) 0.1 % Apply 1 Application topically 2 (two) times daily. 01/13/22   Edrick Kins, DPM  glucose blood test strip Use as instructed  FOR TESTING three times daily.  E11.9 06/01/15   [provider]  hydrochlorothiazide (HYDRODIURIL) 12.5 MG tablet Take 1 tablet (12.5 mg total) by mouth daily. 06/07/21 12/29/21  Lucrezia Starch, MD  ipratropium-albuterol (DUONEB) 0.5-2.5 (3) MG/3ML SOLN Inhale into the lungs. 12/28/15   [provider]  ivabradine (CORLANOR) 7.5 MG TABS tablet Take tablets (15mg ) TWO hours prior to your cardiac CT scan. 04/07/22   Kate Sable, MD  losartan (COZAAR) 50 MG tablet Take 50 mg by mouth daily. 01/05/22   [provider]  metFORMIN (GLUCOPHAGE) 500 MG tablet Take 500 mg by mouth daily with breakfast. 06/08/19   [provider]  metoprolol tartrate (LOPRESSOR) 100 MG tablet Take tablet TWO hours prior to your cardiac CT scan. Hold your Sotalol. 04/07/22   Kate Sable, MD  mirtazapine (REMERON) 15 MG tablet Take 15 mg by mouth at bedtime. 12/12/21   [provider]  OZEMPIC, 1 MG/DOSE, 2 MG/1.5ML SOPN Inject 1  mg into the skin once a week. 11/20/18   [provider]  Risankizumab-rzaa (SKYRIZI) 150 MG/ML SOSY Inject into the skin.    [provider]  ROCKLATAN 0.02-0.005 % SOLN Apply 1 drop to eye at bedtime. 06/20/19   [provider]  sotalol (BETAPACE) 80 MG tablet SMARTSIG:1 Tablet(s) By Mouth Every 12 Hours 01/05/22   [provider]  SOTALOL AF 80 MG TABS Take by mouth 2 (two) times daily. Breakfast and dinner Patient not taking: Reported on 02/06/2022    [provider]  tiZANidine (ZANAFLEX) 2 MG tablet Take 2 mg by mouth 3 (three) times daily. 12/01/21   [provider]  TRELEGY ELLIPTA 100-62.5-25 MCG/ACT AEPB Take 1 puff by mouth daily. 01/07/22   [provider]  triamcinolone cream (KENALOG) 0.1 % Apply 1 Application topically 2 (two) times daily. 12/15/21   [provider]  TURMERIC PO Take by mouth daily. Patient not taking: Reported on 02/06/2022    [provider]     Family History History reviewed. No pertinent family history.  Social History Social History   Tobacco Use   Smoking status: Former    Packs/day: 0.50    Years: 1.00    Total pack years: 0.50    Types: Cigarettes    Quit date: 05/01/1969    Years since quitting: 53.1   Smokeless tobacco: Never   Tobacco comments:    Quit 1971  Vaping Use   Vaping Use: Never used  Substance Use Topics   Alcohol use: No   Drug use: No     Allergies   Rosuvastatin, Ace inhibitors, Lisinopril, and Simbrinza [brinzolamide-brimonidine]   Review of Systems Review of Systems   Physical Exam Triage Vital Signs ED Triage Vitals [06/06/22 1747]  Enc Vitals Group     BP 136/79     Pulse Rate 80     Resp      Temp 98.1 F (36.7 C)     Temp Source Oral     SpO2 95 %     Weight      Height      Head Circumference      Peak Flow      Pain Score      Pain Loc      Pain Edu?      Excl. in [place]?    No data found.  Updated Vital Signs BP 136/79 (BP Location: Left Arm)   Pulse 80   Temp 98.1 F (36.7 C) (Oral)   SpO2 95%   Visual Acuity Right Eye Distance:   Left Eye Distance:   Bilateral Distance:    Right Eye Near:   Left Eye Near:    Bilateral Near:     Physical Exam Constitutional:      Appearance: Normal appearance.  Eyes:     Extraocular Movements: Extraocular movements intact.  Pulmonary:     Effort: Pulmonary effort is normal.  Musculoskeletal:     Comments: Tenderness is present to bilateral lower back without ecchymosis, swelling or deformity, unable to assess straight leg test  Mild to moderate swelling to the anterior of the bilateral knees with associated tenderness, difficulty bearing weight, able to extend and flex the bilateral legs, 2+ brachial pulses  Neurological:     Mental Status: He is alert and oriented to person, place, and time. Mental status is at baseline.      UC Treatments / Results  Labs (all labs ordered are listed, but only  abnormal results are displayed) Labs Reviewed - No data to display  EKG   Radiology No results found.  Procedures Procedures (including critical care time)  Medications Ordered in UC Medications - No data to display  Initial Impression / Assessment and Plan / UC Course  I have reviewed the triage vital signs and the nursing notes.  Pertinent labs & imaging results that were available during my care of the patient were reviewed by me and considered in my medical decision making (see chart for details).  Chronic midline low back pain with bilateral sciatica, chronic pain of both knees  X-ray showing osteoarthritis, discussed with patient, prescribed prednisone taper as well as oxycodone, discussed effect of steroids on blood sugar and advised to monitor closely, advised continued supportive measures with use of DME wheelchair, advised follow-up with pain clinic for further evaluation and management Final Clinical Impressions(s) / UC Diagnoses   Final diagnoses:  None   Discharge Instructions   None    ED Prescriptions   None    PDMP not reviewed this encounter.   Hans Eden, NP 06/06/22 1920

## 2022-06-06 NOTE — ED Triage Notes (Addendum)
Pt c/o knee pain onset x months ago and has now worsened and sciatica pain. Pt reports bilateral knees feel like they're giving out

## 2022-06-07 ENCOUNTER — Ambulatory Visit: Payer: 59

## 2022-06-07 MED ORDER — BUPROPION HCL SR 100 MG TABLET,12 HR SUSTAINED-RELEASE
ORAL_TABLET | Freq: Every morning | ORAL | 10 refills | 30 days | Status: CP
Start: 2022-06-07 — End: ?

## 2022-06-07 NOTE — Unmapped (Signed)
Patient is requesting the following refill  Requested Prescriptions     Pending Prescriptions Disp Refills    buPROPion (WELLBUTRIN SR) 100 MG 12 hr tablet [Pharmacy Med Name: BUPROPION SR 100MG  TAB 12H 100 Tablet] 30 tablet 10     Sig: TAKE 1 TABLET BY MOUTH IN THE MORNING       Recent Visits  Date Type Provider Dept   06/03/22 Office Visit Passannante, Braulio Conte, [place] [facility]   04/21/22 Office Visit Claudean Kinds, MD [facility]   03/27/22 Office Visit [name], [name], [place] [facility] Acute Care Services   03/06/22 Office Visit Criselda Peaches, Lidia Collum, [place] [facility]   12/26/21 Office Visit Claudean Kinds, MD [facility]   12/01/21 Office Visit Claudean Kinds, MD [facility]   11/30/21 Office Visit Gilford Silvius, NP [facility] Acute Care Services   10/24/21 Office Visit Claudean Kinds, MD [facility]   09/22/21 Office Visit Claudean Kinds, MD [facility]   06/24/21 Office Visit Claudean Kinds, MD [facility]   Showing recent visits within past 365 days with a meds authorizing provider and meeting all other requirements  Future Appointments  Date Type Provider Dept   08/18/22 Appointment Criselda Peaches, Lidia Collum, MD [facility]   Showing future appointments within next 365 days with a meds authorizing provider and meeting all other requirements       Labs: Not applicable this refill

## 2022-06-09 ENCOUNTER — Telehealth: Payer: Self-pay | Admitting: Podiatry

## 2022-06-09 NOTE — Telephone Encounter (Signed)
Patient called very upset that he did not get his diabetic shoes, tried to explain to  him that we did not get the paperwork back from his doctor and the order was cancelled.   He is going to file a complaint with [facility] for not getting his shoes.

## 2022-06-12 ENCOUNTER — Ambulatory Visit: Payer: 59

## 2022-06-13 ENCOUNTER — Ambulatory Visit: Payer: 59 | Admitting: Podiatry

## 2022-06-14 ENCOUNTER — Ambulatory Visit: Payer: 59

## 2022-06-15 NOTE — Unmapped (Signed)
LVM to let patient know their appointment has been pushed back 30 minutes due to Viviano Simas Template change [phone]

## 2022-06-16 ENCOUNTER — Ambulatory Visit: Admit: 2022-06-16 | Payer: MEDICARE

## 2022-06-16 ENCOUNTER — Ambulatory Visit
Admit: 2022-06-16 | Payer: MEDICARE | Attending: Rehabilitative and Restorative Service Providers" | Primary: Rehabilitative and Restorative Service Providers"

## 2022-06-16 ENCOUNTER — Ambulatory Visit
Admit: 2022-06-16 | Discharge: 2022-07-10 | Payer: MEDICARE | Attending: Rehabilitative and Restorative Service Providers" | Primary: Rehabilitative and Restorative Service Providers"

## 2022-06-16 DIAGNOSIS — L400 Psoriasis vulgaris: Principal | ICD-10-CM

## 2022-06-16 MED ORDER — SKYRIZI 150 MG/ML SUBCUTANEOUS PEN INJECTOR
SUBCUTANEOUS | 1 refills | 168 days
Start: 2022-06-16 — End: ?

## 2022-06-16 NOTE — Unmapped (Signed)
..  Paul Velez has been contacted in regards to their refill of skyrizi. His next injection is due March 6. The refill is too early. Refill assessment call date has been updated per the patient's request.

## 2022-06-16 NOTE — Unmapped (Signed)
PULMONARY OUTPATIENT PHYSICAL THERAPY   DAILY NOTE    Patient Name: Paul Velez  Date of Birth:[birth year]  Date: 06/16/2022  Session Number:  3  Therapy Diagnosis:   Encounter Diagnoses   Name Primary?    Impaired functional mobility, balance, gait, and endurance Yes    Chronic obstructive pulmonary disease, unspecified COPD type (CMS-HCC)     Back pain, unspecified back location, unspecified back pain laterality, unspecified chronicity        Date of Injury/Onset:05/2021  Date of Evaluation: 05/12/2022  Referring Pracitioner: Claudean Kinds  Certification Dates: 05/12/2022-08/04/22    ASSESSMENT:    77 y.o. male with a hx of COPD, CHF, DM, and sarcoidosis presents with shortness of breath and decreased strength/endurance which are limiting his ability to perform even basic mobility tasks. In addition, the patient reports a flare-up of sciatica, which is also affecting his tolerance for walking and movement.    Today's session focused on ambulation with patient's newly delivered rollator (he is very happy with it), addition to HEP for improved buttocks/sciatica pain relief, and NuStep endurance. Patient tolerated session well. Remains motivated to work with PT and reports good adherence to HEP.     Patient will benefit from skilled Physical Therapy services for gait, strength, endurance, flexibility training; postural re-ed; assistive device education/training; breathing re-training; HEP development; and education.     Problem List: decreased UE and LE strength, impaired posture, deconditioning, pain, impaired ambulation, impaired transfers, and impaired functional mobility      Goals    Patient/Family Goals: Be able to breathe better.      Short Term Goals:  In 3 weeks:  Patient will be able to properly demonstrate current HEP independently x1 in clinic to build upon functional gains in PT.   Patient will ambulate at least 100 feet over level surfaces with LRAD mod indep in order to improve community mobility. Long Term Goals:  6 weeks:  Patient will be able to properly demonstrate current HEP independently x1 in clinic to build upon functional gains in PT.   Patient will improve his score on the TUG from 16.7 seconds to <=14 seconds to indicate decreased falls risk and improved mobility.  Patient will improve his time on the 5-Times Sit-to-Stand test from 19.3 seconds with UE support and partial stand to <=15 seconds with UE support and full stand as a sign of improved LE strength.  Patient will ambulate at least 200 feet over level surfaces with LRAD mod indep in order to improve community mobility.   Patient will ambulate up and down 3 stairs with bilat rails and step-to pattern, mod indep in order to improve his ability to navigate community barriers.    PLAN/Recommendations:   Next session plan: Ambulation with rollator; seated aerobic/strength training; review HEP    Frequency/Duration: 2 x a week for 6 weeks  Planned Interventions: Manual Therapy  Gait Training  Therapeutic Activites  Neuromuscular Education  Self-Care  Physical Performance Test  Therapeutic exercise  Balance training  Postural exercises/education  Body mechanics/education  Education  Dry needling if appropriate    PT DME/Equipment Recs: Assistive Device for ambulation    Communication/consultation with other professionals:  medicare Cert/POC sent to referring practitioner    Home Exercise Program ([id number]), 2x/day:   LAQ x20  Marching x20  Seated clamshell with green t-band (L3) x20  Seated row with L3 band x20  LTR x 5 each way    SUBJECTIVE:  Pt states:   - rollator  is really helping his walking  - pain is more his butt, not my back    Reason for Referral/History of Present Condition/Onset of injury/exacerbation:   77 y.o. male with a hx of COPD, CHF, diabetes, and sarcoidosis presents to the [facility] with shortness of breath and limited mobility.    Pt accompanied by Lynwood Dawley, his partner.     Last Fall: about 1 month ago; would fall out of nowhere (legs felt very weak), normally after getting up from a sitting or lying down position   Precautions: falls; cardiac hx; anemia  Red Flags:  none    Prior Functional Status: Last was able to move around without much difficulty about 1 year ago. Started using supplemental O2 about 10-12 years. Ambulates without equipment.    Current Functional Status: Ambulates without a device.  Currently used Equipment: Has a cane at home, but doesn't feel it was helpful when he tried using it. Uses a scooter at the store. Uses 2 L supplemental O2 day and night.   Previous Treatment: Hx of outpatient PT (finished about 6-12 months ago) in [place]. PT was not helpful.   Employment/Recreation:   - He is able to drive.  - No current exercise. Has access to gym equipment for free and has [employer] access to [employer] (with pool).   - Enjoys playing video games.     Social History: Lives with partner in [place] home (with chair lift) with 3 STE through the front and 2 STE through the garage.   Caregiver availability, capability, willingness: n/a  Independent w/ ADLs?: yes, but needs more time    Patient???s communication preference: Verbal, Written, and Visual    Barriers to Learning:  none    Recent Procedures/Tests/Findings  Reviewed MD notes.      OBJECTIVE :    Vitals:   BP: 137/82  HR: 72  SpO2: 96% on 2L O2    Pain:  Current:  2/10 bilateral glutes (cheek to cheek)    Max:  10/10  Pain Aggravated By: walking, turning in bed, scooting up in bed, prolonged sitting  Pain Relieved By: laying down or sitting down; sitting in seat of car ; Celebrex  Hurt his L knee the last time he fell.    Therapeutic exercise: 40 min    Ambulation with rollator x 4 min (handles adjusted for best fit)    Mat level mobility:  LTR - x 30 sec bilateral (added to HEP)    Ambulation with rollator x 4 min post LTR, about the same buttocks pain    NuStep (green) BUE and BLE (seat/arms position 9) L5 x 10 min  HR 90s  SpO2 95% on 3L Seated, therapeutic rest throughout session as necessary      The patient was provided with an updated written home exercise program, which was reviewed with the patient. The patient verbalized understanding of all exercises and instructions.      Patient Education: Throughout evaluation patient educated regarding the following: Role of PT in Rehabilitation, importance of therapy, equipment recommendations, posture, body mechanics, body awareness, and treatment plan. Patient demonstrated and verbalized agreement and understanding.        Total Time: 40 min   Treatment Rendered:  see above    I attest that I have reviewed the above information.  SignedChristoper Allegra, PT  06/16/2022 10:24 AM

## 2022-06-17 NOTE — Unmapped (Signed)
Refill for skyrizi, it pt on this or Cosentyx?

## 2022-06-19 ENCOUNTER — Ambulatory Visit: Payer: 59

## 2022-06-21 ENCOUNTER — Ambulatory Visit: Payer: 59

## 2022-06-22 ENCOUNTER — Ambulatory Visit: Admit: 2022-06-22 | Discharge: 2022-06-23 | Payer: MEDICARE

## 2022-06-22 LAB — CBC W/ AUTO DIFF
BASOPHILS ABSOLUTE COUNT: 0 10*9/L (ref 0.0–0.1)
BASOPHILS RELATIVE PERCENT: 0.3 %
EOSINOPHILS ABSOLUTE COUNT: 0.2 10*9/L (ref 0.0–0.5)
EOSINOPHILS RELATIVE PERCENT: 1.6 %
HEMATOCRIT: 30.3 % — ABNORMAL LOW (ref 39.0–48.0)
HEMOGLOBIN: 9.1 g/dL — ABNORMAL LOW (ref 12.9–16.5)
LYMPHOCYTES ABSOLUTE COUNT: 1.4 10*9/L (ref 1.1–3.6)
LYMPHOCYTES RELATIVE PERCENT: 10.8 %
MEAN CORPUSCULAR HEMOGLOBIN CONC: 30.1 g/dL — ABNORMAL LOW (ref 32.0–36.0)
MEAN CORPUSCULAR HEMOGLOBIN: 23.3 pg — ABNORMAL LOW (ref 25.9–32.4)
MEAN CORPUSCULAR VOLUME: 77.6 fL (ref 77.6–95.7)
MEAN PLATELET VOLUME: 7.2 fL (ref 6.8–10.7)
MONOCYTES ABSOLUTE COUNT: 0.7 10*9/L (ref 0.3–0.8)
MONOCYTES RELATIVE PERCENT: 5.3 %
NEUTROPHILS ABSOLUTE COUNT: 10.5 10*9/L — ABNORMAL HIGH (ref 1.8–7.8)
NEUTROPHILS RELATIVE PERCENT: 82 %
NUCLEATED RED BLOOD CELLS: 0 /100{WBCs} (ref <=4)
PLATELET COUNT: 328 10*9/L (ref 150–450)
RED BLOOD CELL COUNT: 3.91 10*12/L — ABNORMAL LOW (ref 4.26–5.60)
RED CELL DISTRIBUTION WIDTH: 18.4 % — ABNORMAL HIGH (ref 12.2–15.2)
WBC ADJUSTED: 12.8 10*9/L — ABNORMAL HIGH (ref 3.6–11.2)

## 2022-06-22 LAB — IRON PANEL: TOTAL IRON BINDING CAPACITY: 391 ug/dL (ref 250–425)

## 2022-06-22 LAB — FERRITIN: FERRITIN: 9.2 ng/mL — ABNORMAL LOW

## 2022-06-22 MED ADMIN — iron dextran (INFED) 975 mg in sodium chloride (NS) 0.9 % 250 mL IVPB: 975 mg | INTRAVENOUS | @ 16:00:00 | Stop: 2022-06-22

## 2022-06-22 MED ADMIN — acetaminophen (TYLENOL) tablet 650 mg: 650 mg | ORAL | @ 14:00:00 | Stop: 2022-06-22

## 2022-06-22 MED ADMIN — iron dextran (INFED) 25 mg in sodium chloride (NS) 0.9 % 25 mL IVPB: 25 mg | INTRAVENOUS | @ 15:00:00 | Stop: 2022-06-22

## 2022-06-22 MED ADMIN — diphenhydrAMINE (BENADRYL) capsule/tablet 50 mg: 50 mg | ORAL | @ 14:00:00 | Stop: 2022-06-22

## 2022-06-22 NOTE — Unmapped (Signed)
Patient presents here for first time Iron dextran infusion. Denies any recent fever/chills or exposed to sick persons, VSS. Patient on O2 2L via nasal canula. Provided the use of clinics available oxygen and patient declined. Patient was provided verbal instruction on medication and verbalized understanding. Concern questions were answered. PIV placed in right hand and secured with Tegaderm, labs drawn. Cal bell within reach. Premedications administered as per ordered.     0981 Test dose of Iron dextran 25 mg initiated     0957 Test dose completed w/o any issues, VSS. PIV flushed per protocol    No c/o side effects     1047 Full dose of Iron dextran started      1146 Infusion completed     Infusion tolerated well, VSS. PIV flushed per protocol. Patient monitored for post infusion with no s/s of adverse reaction. PIV d/c'd, no bleeding, bruising or swelling noted, gauze and coban applied. Patient left clinic in stable condition.

## 2022-06-23 NOTE — Unmapped (Signed)
[facility]  [address] [name] [place], [place] [address]    [phone]    Paul Velez called to cancel his scheduled Physical Therapy follow-up session.       Signed: Eulas Post, PT  06/23/2022 12:43 PM

## 2022-06-26 ENCOUNTER — Ambulatory Visit: Payer: 59

## 2022-06-28 ENCOUNTER — Ambulatory Visit: Payer: 59

## 2022-06-28 DIAGNOSIS — L400 Psoriasis vulgaris: Principal | ICD-10-CM

## 2022-06-28 MED ORDER — EDOXABAN 60 MG TABLET
ORAL_TABLET | Freq: Every day | ORAL | 3 refills | 90 days
Start: 2022-06-28 — End: 2023-06-28

## 2022-06-28 MED ORDER — METFORMIN 1,000 MG TABLET
ORAL_TABLET | Freq: Two times a day (BID) | ORAL | 1 refills | 90 days | Status: CP
Start: 2022-06-28 — End: 2023-06-28

## 2022-06-28 MED ORDER — AMLODIPINE 2.5 MG TABLET
ORAL_TABLET | Freq: Every day | ORAL | 2 refills | 90 days | Status: CP
Start: 2022-06-28 — End: 2023-06-28

## 2022-06-28 MED ORDER — EMPAGLIFLOZIN 10 MG TABLET
ORAL_TABLET | Freq: Every day | ORAL | 0 refills | 90 days | Status: CP
Start: 2022-06-28 — End: ?

## 2022-06-28 MED ORDER — ATORVASTATIN 20 MG TABLET
ORAL_TABLET | Freq: Every day | ORAL | 2 refills | 90 days | Status: CP
Start: 2022-06-28 — End: ?

## 2022-06-28 MED ORDER — GABAPENTIN 800 MG TABLET
ORAL_TABLET | Freq: Two times a day (BID) | ORAL | 3 refills | 90 days | Status: CP
Start: 2022-06-28 — End: 2023-06-28

## 2022-06-28 MED ORDER — OZEMPIC 1 MG/DOSE (4 MG/3 ML) SUBCUTANEOUS PEN INJECTOR
SUBCUTANEOUS | 1 refills | 84 days | Status: CP
Start: 2022-06-28 — End: 2022-12-25

## 2022-06-28 MED ORDER — TIZANIDINE 2 MG TABLET
ORAL_TABLET | Freq: Every evening | ORAL | 1 refills | 30 days | Status: CP | PRN
Start: 2022-06-28 — End: ?

## 2022-06-28 MED ORDER — FUROSEMIDE 80 MG TABLET
ORAL_TABLET | Freq: Every day | ORAL | 2 refills | 90 days | Status: CP
Start: 2022-06-28 — End: 2023-06-28

## 2022-06-28 MED ORDER — HYDROCHLOROTHIAZIDE 12.5 MG TABLET
ORAL_TABLET | Freq: Every day | ORAL | 2 refills | 90 days | Status: CP
Start: 2022-06-28 — End: ?

## 2022-06-28 MED ORDER — DULOXETINE 60 MG CAPSULE,DELAYED RELEASE
ORAL_CAPSULE | Freq: Every day | ORAL | 2 refills | 90 days | Status: CP
Start: 2022-06-28 — End: 2023-06-28

## 2022-06-28 MED ORDER — SKYRIZI 150 MG/ML SUBCUTANEOUS PEN INJECTOR
SUBCUTANEOUS | 1 refills | 168 days
Start: 2022-06-28 — End: ?

## 2022-06-28 MED ORDER — BUPROPION HCL SR 100 MG TABLET,12 HR SUSTAINED-RELEASE
ORAL_TABLET | Freq: Every morning | ORAL | 1 refills | 90 days | Status: CP
Start: 2022-06-28 — End: ?

## 2022-06-28 NOTE — Unmapped (Signed)
Patient is requesting the following refill  Received a fax that patient is requesting the following med be sent to [facility]. Please Advise.  Requested Prescriptions     Pending Prescriptions Disp Refills    tizanidine (ZANAFLEX) 2 MG tablet 30 tablet 10    gabapentin (NEURONTIN) 800 MG tablet 270 tablet 0     Sig: Take 1 tablet (800 mg total) by mouth Three (3) times a day.    edoxaban (SAVAYSA) 60 mg tablet 90 tablet 3     Sig: Take 1 tablet (60 mg total) by mouth daily.     Signed Prescriptions Disp Refills    amlodipine (NORVASC) 2.5 MG tablet 90 tablet 2     Sig: Take 1 tablet (2.5 mg total) by mouth daily.     Authorizing Provider: Claudean Kinds     Ordering User: Lynnae Sandhoff    atorvastatin (LIPITOR) 20 MG tablet 90 tablet 2     Sig: Take 1 tablet (20 mg total) by mouth daily.     Authorizing Provider: Claudean Kinds     Ordering User: Lynnae Sandhoff    buPROPion (WELLBUTRIN SR) 100 MG 12 hr tablet 90 tablet 1     Sig: Take 1 tablet (100 mg total) by mouth every morning.     Authorizing Provider: Claudean Kinds     Ordering User: Lynnae Sandhoff    empagliflozin (JARDIANCE) 10 mg tablet 90 tablet 0     Sig: Take 1 tablet (10 mg total) by mouth daily.     Authorizing Provider: Claudean Kinds     Ordering User: Lynnae Sandhoff    hydroCHLOROthiazide 12.5 MG tablet 90 tablet 2     Sig: Take 1 tablet (12.5 mg total) by mouth daily.     Authorizing Provider: Claudean Kinds     Ordering User: Lynnae Sandhoff    metFORMIN (GLUCOPHAGE) 1000 MG tablet 90 tablet 1     Sig: Take 0.5 tablets (500 mg total) by mouth in the morning and 0.5 tablets (500 mg total) in the evening. Take with meals.     Authorizing Provider: Claudean Kinds     Ordering User: Lynnae Sandhoff    DULoxetine (CYMBALTA) 60 MG capsule 90 capsule 2     Sig: Take 1 capsule (60 mg total) by mouth daily.     Authorizing Provider: Claudean Kinds     Ordering User: Lynnae Sandhoff furosemide (LASIX) 80 MG tablet 90 tablet 2     Sig: Take 1 tablet (80 mg total) by mouth daily.     Authorizing Provider: Claudean Kinds     Ordering User: Lynnae Sandhoff    semaglutide (OZEMPIC) 1 mg/dose (4 mg/3 mL) PnIj injection 9 mL 1     Sig: Inject 1 mg under the skin every seven (7) days.     Authorizing Provider: Claudean Kinds     Ordering User: Lynnae Sandhoff       Recent Visits  Date Type Provider Dept   06/03/22 Office Visit Passannante, Braulio Conte, [place] [facility] Acute Care Services   04/21/22 Office Visit Claudean Kinds, MD [facility]   03/27/22 Office Visit [name], [name], [place] [facility] Acute Care Services   03/06/22 Office Visit Claudean Kinds, MD [facility]   12/26/21 Office Visit Criselda Peaches Lidia Collum, MD [facility]  [place]   12/01/21 Office Visit Criselda Peaches, Lidia Collum, MD [facility]   11/30/21 Office Visit Gilford Silvius, NP [facility] Acute Care Services   10/24/21 Office Visit Claudean Kinds, MD [facility]   09/22/21 Office Visit Criselda Peaches Lidia Collum, MD [facility]   Showing recent visits within past 365 days with a meds authorizing provider and meeting all other requirements  Future Appointments  Date Type Provider Dept   08/18/22 Appointment Criselda Peaches, Lidia Collum, MD [facility]   Showing future appointments within next 365 days with a meds authorizing provider and meeting all other requirements       Labs: Not applicable this refill

## 2022-06-28 NOTE — Unmapped (Signed)
Addended by: Lynnae Sandhoff on: 06/28/2022 03:29 PM     Modules accepted: Orders

## 2022-06-28 NOTE — Unmapped (Signed)
Pt is currently on secukinumab pen injection and this medication was discontinued on 05/19/2022

## 2022-06-28 NOTE — Unmapped (Addendum)
Received a fax from [facility] stating that patient has requested we send over prescriptions for Buproprion, Jardiance, Amlodipine, hydrochlorothiazide, Atorvastatin, and Metformin, Furosemide, Ozempic, and Cymbalta.I am only refilling these medication to last until the original dates they were refilled to.

## 2022-06-29 DIAGNOSIS — L400 Psoriasis vulgaris: Principal | ICD-10-CM

## 2022-06-29 MED ORDER — SKYRIZI 150 MG/ML SUBCUTANEOUS PEN INJECTOR
SUBCUTANEOUS | 1 refills | 168 days
Start: 2022-06-29 — End: ?

## 2022-07-03 ENCOUNTER — Ambulatory Visit: Payer: 59

## 2022-07-03 NOTE — Unmapped (Signed)
[facility]  [address] [name], [place] [address]    [phone]    Paul Velez called to cancel his scheduled Physical Therapy follow-up session due to a scheduling conflict.       Signed: Eulas Post, PT  07/03/2022 2:48 PM

## 2022-07-04 DIAGNOSIS — L400 Psoriasis vulgaris: Principal | ICD-10-CM

## 2022-07-04 MED ORDER — SKYRIZI 150 MG/ML SUBCUTANEOUS PEN INJECTOR
SUBCUTANEOUS | 1 refills | 84.00000 days | Status: CP
Start: 2022-07-04 — End: ?
  Filled 2022-07-10: qty 1, 84d supply, fill #0

## 2022-07-05 ENCOUNTER — Ambulatory Visit: Payer: 59

## 2022-07-07 NOTE — Unmapped (Signed)
[facility] Refill Coordination Note    Specialty Medication(s) to be Shipped:   Inflammatory Disorders: Skyrizi    Other medication(s) to be shipped: No additional medications requested for fill at this time     Paul Velez, DOB: 1945/10/28  Phone: [phone] (home)       All above HIPAA information was verified with patient.     Was a Nurse, learning disability used for this call? No    Completed refill call assessment today to schedule patient's medication shipment from the [facility] [phone]).  All relevant notes have been reviewed.     Specialty medication(s) and dose(s) confirmed: Regimen is correct and unchanged.   Changes to medications: Karen reports no changes at this time.  Changes to insurance: No  New side effects reported not previously addressed with a pharmacist or physician: None reported  Questions for the pharmacist: No    Confirmed patient received a Conservation officer, historic buildings and a Surveyor, mining with first shipment. The patient will receive a drug information handout for each medication shipped and additional FDA Medication Guides as required.       DISEASE/MEDICATION-SPECIFIC INFORMATION        For patients on injectable medications: Patient currently has 0 doses left.  Next injection is scheduled for 07/05/2022. Patient will take dose on delivery day.    SPECIALTY MEDICATION ADHERENCE     Medication Adherence    Patient reported X missed doses in the last month: 0  Specialty Medication: Skyrizi 150 mg/mL  Patient is on additional specialty medications: No  Informant: patient              Were doses missed due to medication being on hold? No    Skyrizi 150 mg/ml: 0 days of medicine on hand       REFERRAL TO PHARMACIST     Referral to the pharmacist: Not needed      [facility]     Shipping address confirmed in Epic.     Delivery Scheduled: Yes, Expected medication delivery date: 07/10/2022.     Medication will be delivered via Same Day Courier to the prescription address in Epic WAM.    [name]   [facility] Specialty Technician

## 2022-07-10 ENCOUNTER — Ambulatory Visit: Payer: 59

## 2022-07-12 ENCOUNTER — Ambulatory Visit: Payer: 59

## 2022-07-15 NOTE — Unmapped (Signed)
Received urgent fax from [facility] for Celecoxib and Mirtazapine refills. Previously sent to [facility] pharmacy. LVM for pt to return call to determine which pharmacy he uses. [facility] rx fax located in Dr. Donnetta Hutching mailbox.

## 2022-07-17 ENCOUNTER — Ambulatory Visit: Payer: 59

## 2022-07-18 DIAGNOSIS — L400 Psoriasis vulgaris: Principal | ICD-10-CM

## 2022-07-18 MED ORDER — TRIAMCINOLONE ACETONIDE 0.1 % TOPICAL CREAM
Freq: Two times a day (BID) | TOPICAL | 2 refills | 0.00000 days | Status: CP
Start: 2022-07-18 — End: 2023-07-18

## 2022-07-18 NOTE — Unmapped (Signed)
Faxed refill request received from [facility] for triamcinolone 0.1% cream.    Patient last seen 12/15/2021.  Refills pended if appropriate.      Requested Prescriptions     Pending Prescriptions Disp Refills    triamcinolone (KENALOG) 0.1 % cream 454 g 2     Sig: Apply topically two (2) times a day. For your legs.     Recent Visits  Date Type Provider Dept   06/03/22 Office Visit Passannante, Braulio Conte, [place] [facility] Acute Care Services   04/21/22 Office Visit Claudean Kinds, MD [facility]   03/27/22 Office Visit [name], [name], [place] [name] Acute Care Services   03/06/22 Office Visit Claudean Kinds, MD [facility]   12/26/21 Office Visit Claudean Kinds, MD [facility]   12/15/21 Office Visit Cruzita Lederer, MD [facility]   12/01/21 Office Visit Claudean Kinds, MD [facility]   11/30/21 Office Visit Gilford Silvius, NP [facility] Acute Care Services   10/24/21 Office Visit Claudean Kinds, MD [facility]   09/22/21 Office Visit Claudean Kinds, MD [facility] [place]   Showing recent visits within past 540 days with a meds authorizing provider and meeting all other requirements  Future Appointments  Date Type Provider Dept   08/18/22 Appointment Criselda Peaches, Lidia Collum, MD [facility]   Showing future appointments within next 150 days with a meds authorizing provider and meeting all other requirements

## 2022-07-19 ENCOUNTER — Ambulatory Visit: Payer: 59

## 2022-07-21 NOTE — Unmapped (Signed)
03.22.2024- LVM BUMP w Jimmey Ralph on 04.25.2024

## 2022-07-24 MED FILL — ONETOUCH VERIO TEST STRIPS: 50 days supply | Qty: 100 | Fill #1

## 2022-07-24 MED FILL — EMPTY CONTAINER: 120 days supply | Qty: 1 | Fill #1

## 2022-08-01 NOTE — Unmapped (Signed)
[facility]  Annual Wellness Visit Note    Patient ID: Paul Velez is a 77 y.o. male who presents for a Medicare Annual Wellness Visit.    Informant: Patient is accompanied by spouse..    Assessment/Plan:      Medicare Annual Wellness Visit      Risks identified  -- Fall risk:   Fall risk identified.  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 Continue to monitor      Treatment options and their associated risks/benefits were reviewed with the patient.    Advanced care planning  -- Patient has a living will.    Personalized Prevention Plan  A personalized prevention plan was reviewed with the patient and a written copy was provided for personal records (available for review in Patient Instructions).    The following preventive services were advised:  Tdap: will pursue vaccine at local pharmacy  Shingrix: will pursue vaccine at local pharmacy    Return in about 1 year (around 08/02/2023) for AWV and 3 months follow up DM2 and HTN labs before.    @[email or web address] @    Subjective:     Health Risk Assessment (HRA)   HRA completed by patient and reviewed: Yes    Medical/Family History  Allergies   Allergen Reactions    Rosuvastatin Anaphylaxis     felt bad    Ace Inhibitors Anxiety    Lisinopril Other (See Comments)     Other reaction(s): Mental Status Change, Cough    Brinzolamide-Brimonidine Itching     And burning  And burning         Outpatient Medications Prior to Visit   Medication Sig Dispense Refill    albuterol sulfate 90 mcg/actuation AePB Inhale.      amlodipine (NORVASC) 2.5 MG tablet Take 1 tablet (2.5 mg total) by mouth daily. 90 tablet 2    atorvastatin (LIPITOR) 20 MG tablet Take 1 tablet (20 mg total) by mouth daily. 90 tablet 2    celecoxib (CELEBREX) 200 MG capsule Take 1 capsule (200 mg total) by mouth two (2) times a day as needed for pain. (Patient taking differently: Take 2 capsules (400 mg total) by mouth daily as needed for pain.) 180 capsule 3    clobetasoL (TEMOVATE) 0.05 % ointment Apply topically Two (2) times a day. Use on psoriasis until smooth. 60 g 1    DULoxetine (CYMBALTA) 60 MG capsule Take 1 capsule (60 mg total) by mouth daily. 90 capsule 2    edoxaban (SAVAYSA) 60 mg tablet Take 1 tablet (60 mg total) by mouth daily. 90 tablet 3    empagliflozin (JARDIANCE) 10 mg tablet Take 1 tablet (10 mg total) by mouth daily. 90 tablet 0    empty container Misc Use as directed to dispose of [employer]. 1 each 2    furosemide (LASIX) 80 MG tablet Take 1 tablet (80 mg total) by mouth daily. 90 tablet 2    gabapentin (NEURONTIN) 800 MG tablet Take 1 tablet (800 mg total) by mouth two (2) times a day. 180 tablet 3    hydroCHLOROthiazide 12.5 MG tablet Take 1 tablet (12.5 mg total) by mouth daily. 90 tablet 2    hydrocortisone 2.5 % cream Apply topically Two (2) times a day. Apply behind your ears. 30 g 1    losartan (COZAAR) 50 MG tablet Take 1 tablet (50 mg total) by mouth daily.  metFORMIN (GLUCOPHAGE) 1000 MG tablet Take 0.5 tablets (500 mg total) by mouth in the morning and 0.5 tablets (500 mg total) in the evening. Take with meals. 90 tablet 1    ONETOUCH VERIO TEST STRIPS Strp Use to check blood sugar twice daily as directed. 100 strip 3    risankizumab-rzaa (SKYRIZI) 150 mg/mL PnIj Inject the contents of 1 pen (150mg ) under the skin every 12 weeks as maintenance. 1 mL 1    semaglutide (OZEMPIC) 1 mg/dose (4 mg/3 mL) PnIj injection Inject 1 mg under the skin every seven (7) days. 9 mL 1    sotalol (BETAPACE) 80 MG tablet Take 1 tablet (80 mg total) by mouth two (2) times a day. 180 tablet 3    tamsulosin (FLOMAX) 0.4 mg capsule Take 1 capsule (0.4 mg total) by mouth daily. 90 capsule 3    tizanidine (ZANAFLEX) 2 MG tablet Take 1 tablet (2 mg total) by mouth nightly as needed (muscle cramps). 30 tablet 1    TRELEGY ELLIPTA 100-62.5-25 mcg inhaler       triamcinolone (KENALOG) 0.1 % cream Apply topically two (2) times a day. For your legs. 454 g 2    umeclidinium (INCRUSE ELLIPTA) 62.5 mcg/actuation inhaler Inhale 1 puff daily.      buPROPion (WELLBUTRIN SR) 100 MG 12 hr tablet Take 1 tablet (100 mg total) by mouth every morning. 90 tablet 1    mirtazapine (REMERON) 15 MG tablet Take 1 tablet (15 mg total) by mouth nightly. 90 tablet 3    ROCKLATAN 0.02-0.005 % Drop Administer 1 drop to both eyes nightly. (Patient not taking: Reported on 08/02/2022)       No facility-administered medications prior to visit.       Past Medical History:   Diagnosis Date    Allergic     CHF (congestive heart failure) (CMS-HCC)     COPD (chronic obstructive pulmonary disease) (CMS-HCC)     Diabetes type 2, controlled (CMS-HCC)     HLD (hyperlipidemia)     HTN (hypertension)     Neuropathy     OA (osteoarthritis) of knee     PAF (paroxysmal atrial fibrillation) (CMS-HCC)     Sarcoidosis        No past surgical history on file.    Family History   Problem Relation Age of Onset    Melanoma Neg Hx     Basal cell carcinoma Neg Hx     Squamous cell carcinoma Neg Hx        Social Determinants of Health     Financial Resource Strain: Low Risk  (02/13/2017)    Received from [facility]    Overall Financial Resource Strain (CARDIA)     Difficulty of Paying Living Expenses: Not hard at all   Internet Connectivity: Not on file   Food Insecurity: No Food Insecurity (04/21/2022)    Hunger Vital Sign     Worried About Running Out of Food in the Last Year: Never true     Ran Out of Food in the Last Year: Never true   Tobacco Use: Medium Risk (08/02/2022)    Patient History     Smoking Tobacco Use: Former     Smokeless Tobacco Use: Never     Passive Exposure: Not on file   Housing/Utilities: Not on file   Alcohol Use: Not on file   Transportation Needs: No Transportation Needs (02/13/2017)    [facility]    PRAPARE -  Therapist, art (Medical): No     Lack of Transportation (Non-Medical): No   Substance Use: Not on file   Health Literacy: Not on file   Physical Activity: Not on file   Interpersonal Safety: Not on file   Stress: Not on file   Intimate Partner Violence: Not on file   Depression: Not at risk (04/21/2022)    PHQ-2     PHQ-2 Score: 1   Social Connections: Not on file       GENERAL HEALTH SELF ASSESSMENT:  SELF-HEALTH:   In general, how do you  rate your overall health? Poor  How would you describe the condition of your mouth and teeth, including false teeth or dentures? Poor      Functional Abilities/ Level of Safety  Home safety concerns: No  Do you own firearms and if so are they stored unloaded and securely locked? No  Do you have working smoke alarms and Scientist, forensic available for use?  No    ADL needs: transferring  iADL needs: none    Fall in the last year: No  Have throw rugs been removed or fastened down? :No    Hearing difficulty: No     Memory:  Do you have any concerns about your memory?:  sometimes feels like can't remember the day before but it comes back in a few mins  Is anyone else in your life concerned about your memory? :  sometimes       Exercise:  Do you exercise and if yes, how often? N/A   Would you like to be referred to nutrition if you have a calculated body mass index < 18.5 or > 30? No    Psychosocial risks  Past history of depression: No  Today    Alcohol risk: No  Social History     Tobacco Use    Smoking status: Former     Current packs/day: 0.00     Types: Cigarettes     Quit date: 1971     Years since quitting: 53.2    Smokeless tobacco: Never   Substance Use Topics    Alcohol use: Not Currently       Current Suppliers (DME)  [employer]    Preventive Care  Health Maintenance   Topic Date Due    COPD Spirometry  Never done    Retinal Eye Exam  Never done    Zoster Vaccines (1 of 2) Never done    DTaP/Tdap/Td Vaccines (1 - Tdap) 10/31/2006    Foot Exam  06/24/2022    Urine Albumin/Creatinine Ratio  09/15/2022 Hemoglobin A1c  10/21/2022    Serum Creatinine Monitoring  04/22/2023    Potassium Monitoring  04/22/2023    Medicare Annual Wellness Visit (AWV)  09/01/2023    Pneumococcal Vaccine 65+  Completed    Hepatitis C Screen  Completed    COVID-19 Vaccine  Completed    Influenza Vaccine  Completed    Colonoscopy  Discontinued        Immunization History   Administered Date(s) Administered    COVID-19 VAC,BIVALENT,MODERNA(BLUE CAP) 01/24/2021    COVID-19 VACCINE,MRNA(MODERNA)(PF) 06/28/2019, 07/26/2019, 04/05/2020    INFLUENZA INJ MDCK PF, QUAD,(FLUCELVAX)(51MO AND UP EGG FREE) 01/23/2019, 04/22/2021    Influenza Vaccine Quad(IM)6 MO-Adult(PF) 04/20/2020    Influenza Virus Vaccine, unspecified formulation 02/23/2014    PNEUMOCOCCAL POLYSACCHARIDE 23-VALENT 05/21/2013, 05/16/2016    Pneumococcal Conjugate 13-Valent 05/21/2013    Td (adult) unspecified  formulation 10/30/2006       Current Providers   Patient Care Team:  Claudean Kinds, MD as PCP - General (Family Medicine)  Criselda Peaches Lidia Collum, MD as PCP - General-ATTRIBUTED    Advance Directive and Code Status   Patient already has a living will.      Objective:     Vital Signs  BP 112/76  - Pulse 74  - Temp 36.6 ??C (97.8 ??F) (Oral)  - Resp 16  - SpO2 91%      Hearing Test   Uses hearing aids    Mobility Test   Limited ability due to sciatica and knee OA             Cognitive Assessment  It is apparent from the interview that the patient is cognitively intact with high functioning intellectual faculties.      Other Exam  SEE BELOW    Reason For Visit:  Physical Exam    HPI:  77 y.o. male here for Physical Exam  He is here for annual wellness and Well Adult Check.     ROS  Constitutional: Denies excessive fatigue, fever, chills, sweats, or lightheadedness.  Eyes:  seeing eye doctor, can't see upclose   ENT: Denies ear pain, changes in hearing, nasal/sinus problems, sneezing, or sore throat.  Respiratory: + cough + SOB + Wheezing + Dypsnea on Exertion  Cardiovascular: + Ankle Edema  Gastrointestinal: Denies nausea/vomiting, constipation, abdominal pain, bloody stool, diarrhea, or reflux.  Genitourinary: Denies urinary frequency, dysuria, urgency, hematuria, incontinence, weak stream, changes in urination,  discharge, pain, or sexual dysfunction.  Musculoskeletal: Denies new pain or swelling in joints, or new or unusual muscle pain  Skin/Nails/Hair: Denies rash, itching, hair loss, new/changing lesions, or slow-healing wounds.  Neurological: + Numbness  Mental Health: Denies sleep disturbance, depression, anxiety, suicidal ideation, or substance abuse.  Endocrine: + Heat Intolerance + Cold intolerance  Hematologic: Denies unusual bleeding/bruising or lymphadenopathy.  Allergy: Dry eyes and runny nose      Exercise:   Do you exercise and if yes, how often? N/A  Would you like to be referred to nutrition if you have a calculated body mass index < 18.5 or > 30? No  How many meals do you eat per day?: 2  How many servings of fruits or vegetables do you have/per day? : 1    PHYSICAL EXAM:    VITAL SIGNS:    BP 112/76  - Pulse 74  - Temp 36.6 ??C (97.8 ??F) (Oral)  - Resp 16  - SpO2 91%   Wt Readings from Last 2 Encounters:   05/19/22 89.4 kg (197 lb)   11/30/21 (!) 107.5 kg (237 lb)       Physical Exam  Vitals reviewed.   Constitutional:       General: He is not in acute distress.     Appearance: Normal appearance. He is well-developed. He is not diaphoretic.   HENT:      Head: Normocephalic and atraumatic.      Right Ear: Hearing normal.      Left Ear: Hearing normal.      Nose: Nose normal.   Eyes:      General: No scleral icterus.        Right eye: No discharge.         Left eye: No discharge.      Conjunctiva/sclera: Conjunctivae normal.      Right eye: Right conjunctiva is not injected.  Left eye: Left conjunctiva is not injected.   Neck:      Thyroid: No thyroid mass.      Vascular: No carotid bruit or JVD.      Trachea: Trachea and phonation normal. Cardiovascular:      Rate and Rhythm: Normal rate and regular rhythm. No extrasystoles are present.     Pulses: Normal pulses.           Radial pulses are 2+ on the right side and 2+ on the left side.      Heart sounds: Murmur heard.   Pulmonary:      Effort: Pulmonary effort is normal. No respiratory distress.      Breath sounds: Wheezing present. No rales.      Comments: Oxygen 2L [place]    Musculoskeletal:         General: No deformity. Normal range of motion.      Cervical back: Normal range of motion.      Right lower leg: No edema.      Left lower leg: No edema.   Skin:     General: Skin is warm and dry.      Capillary Refill: Capillary refill takes less than 2 seconds.      Findings: Lesion and rash present. No bruising or erythema.      Nails: There is no clubbing.   Neurological:      Mental Status: He is alert and oriented to person, place, and time.      Cranial Nerves: No cranial nerve deficit.      Motor: Weakness present.      Gait: Gait normal.   Psychiatric:         Mood and Affect: Mood normal. Mood is not anxious.         Speech: Speech normal.         Behavior: Behavior normal.         Thought Content: Thought content normal.         Judgment: Judgment normal.

## 2022-08-01 NOTE — Unmapped (Signed)
Name: Paul Velez  DOB: 1946/01/17  Today's Date: 08/01/2022  Age: 77 y.o.    MEDICARE SCREENING & PREVENTION GUIDELINES RECOMMENDATIONS DATE UP-TO-DATE OR   TO BE REASSESSED   AAA Ultrasound Once in men age 49 to 31 who have ever smoked or currently smoke. Abdominal ultrasound date: Not Found defer to PCP   Colorectal Cancer Screening Patients 50 to 75: stool cards annually OR colonoscopy every 10 years (or more frequently if high risk) OR FIT-DNA every 3 years.  Colonoscopy date: Not Found  FOBT date: Not Found defer to PCP   DEXA Bone Density Measurement Patients age 33-85 to have a DEXA every 5 years in postmenopausal women, males will defer to PCP. DXA date: Not Found defer to PCP   Diabetes Eye Exam  Annually if Diabetic, or every 2 years if prior exam did not show retinopathy in both eyes. Eye Exam date: Not Found not applicable    Diabetes Foot Exam  Annually if Diabetic. Most Recent Foot Exam Date: 06/24/2021 due    Diabetes Screening (fasting glucose) Annually for patients age 50-70. If risk factors (family hx of DM, hx of gestational DM, and/or PCOS), or diagnosed with pre-diabetes, twice per year. Normal fasting glucose range: 65-99. If diagnosed with diabetes, not applicable. Lab Results   Component Value Date    GLU 130 04/21/2022     No results found for: GLUF  No results found for: FBSFP  Lab Results   Component Value Date    POCGLU 133 09/14/2021    due    Heart Disease Screening (fasting lipid panel) Minimum of every 5 years, patients age 16-75,  if no apparent signs or symptoms of heart disease. If diagnosed with hyperlipidemia, not applicable. Lab Results   Component Value Date/Time    CHOL 125 12/26/2021 10:37 AM    HDL 48 12/26/2021 10:37 AM    LDL 56 12/26/2021 10:37 AM    defer to PCP   Mammogram Screening (women) Age 19-74 every 2 years.  Mammogram date: Not Found N/A   Pelvic Exam & Pap Smear  (women) Women ages 54 to 68 every 3 years with negative cytology (pap smear)  OR,   women ages 69 to 77 every 5 years if they have had both a negative pap and human papillomavirus (HPV) OR,  Every 3 years if they had a positive HPV result. Pap Smear date: Not Found    @LASTHPVDT @ N/A   Hepatitis C Screening  A one-time screening for HCV infection for adults born between [birth year] & 1965. Lab Results   Component Value Date/Time    HEPCAB Nonreactive 06/24/2021 04:09 PM    Complete    Tdap/Td Tdap should be a one-time dose for adults and Td is given every 10 years.   provided information on how to receive vaccine at pharmacy   Influenza Vaccine Annually   due    Prevnar 73 Prevnar given at age 5 and Pneumovax given one year later. These vaccines may be given in a different sequence depending on chronic conditions. (utilize BPA for dosing & administration)  complete    Pneumovax 23 Prevnar given at age 32 and Pneumovax given one year later. These vaccines may be given in a different sequence depending on chronic conditions. (utilize BPA for dosing & administration)  complete    Zostavax Vaccine Once at age 18 or older (may not be covered by Medicare)  not applicable    Shingrix Vaccine 2-dose series, beginning at age 39.  The 2nd dose is administered 2-6 months after the first.   provided information on how to receive vaccine at pharmacy     Immunization History   Administered Date(s) Administered    COVID-19 VAC,BIVALENT,MODERNA(BLUE CAP) 01/24/2021    COVID-19 VACCINE,MRNA(MODERNA)(PF) 06/28/2019, 07/26/2019, 04/05/2020    INFLUENZA INJ MDCK PF, QUAD,(FLUCELVAX)(48MO AND UP EGG FREE) 01/23/2019, 04/22/2021    Influenza Vaccine Quad(IM)6 MO-Adult(PF) 04/20/2020    Influenza Virus Vaccine, unspecified formulation 02/23/2014    PNEUMOCOCCAL POLYSACCHARIDE 23-VALENT 05/21/2013, 05/16/2016    Pneumococcal Conjugate 13-Valent 05/21/2013    Td (adult) unspecified formulation 10/30/2006

## 2022-08-02 ENCOUNTER — Ambulatory Visit: Admit: 2022-08-02 | Discharge: 2022-08-03 | Payer: MEDICARE

## 2022-08-02 DIAGNOSIS — Z0000 Encounter for general adult medical examination without abnormal findings: Principal | ICD-10-CM

## 2022-08-02 NOTE — Unmapped (Signed)
AWV exam completed today including a review of medical history, functional and safety assessment, depression screening, substance use risk screening and biometric data. See furhter documentation per LCSW.     Personalized Prevention Plan: A personalized prevention plan was reviewed with the patient and a written copy was provided for personal records (available for review in Patient Instructions).    Risks identified: -- Functional limitations:  Pt unable to perform ADLs: will schedule f/u with patient and caregiver to discuss further interventions.  Patient referred to [employer] on Aging.  -- Fall risk:  Fall risk identified.  Patient referred to physical therapy  -- Depression:  Severe depression identified on screening: Patient was offered counseling and medication titration was performed    The following preventive services were discussed with the patient:    -- Recent laboratory tests were reviewed and noted to be within appropriate range:A1c and CBC  -- Vaccinations: Shingrix: will get at local pharmacy    Barriers to goals identified and addressed. Pertinent handouts were given today and reviewed with the patient as indicated.  The Care Plan and Self-Management goals have either been printed for the patient in the AVS, or sent to the patients MyChart as per the patients preference. Any outside resources or referrals needed at this time are noted above. Patient's current medications have been reviewed.  Patient voiced understanding and all questions have been answered to satisfaction.

## 2022-08-14 ENCOUNTER — Ambulatory Visit: Admit: 2022-08-14 | Payer: MEDICARE

## 2022-08-25 NOTE — Unmapped (Signed)
[facility] General Neurology Clinic Summary       [address] [place]  [address] [place] [address]  [phone]    Date: 08/31/2022  Patient Name: Paul Velez  MRN: [id number]  PCP: Livia Snellen, *            Paul Velez is a 77 y.o.  male seen at the [place] [facility] for follow up.     Assessment & Plan:   Assessment     Mr. Paul Velez is a 77 y.o.  male seen for the following problems:     Diagnosis ICD-10-CM Associated Orders   1. Other fatigue  R53.83 Vitamin B12 Level     Folate Level     TSH     T4, Free     CBC w/ Differential     Vitamin B12 Level     Folate Level     TSH     T4, Free     CBC w/ Differential      2. Sensory disturbance  R20.9 Vitamin B12 Level     Folate Level     EMG     Vitamin B12 Level     Folate Level      3. Back pain with sciatica  M54.9 MRI lumbar spine without contrast    M54.30       4. Routine health maintenance  Z00.00 Vitamin B6     Vitamin B1, Whole Blood     Hemoglobin A1c     Comprehensive Metabolic Panel        Fatigue: Patient with reported fatigue impacting his day-to-day.  Patient has history of anemia which may be contributing to fatigue, as well as known low iron, low iron saturation and low ferritin which may also be contributing to fatigue.    DDX:  Unclear etiology at this time as other possible causes of fatigue have not been ruled out.  Plan to check B vitamins as well as TSH and T4 to rule out other causes of fatigue  Possibly anemia related but need to rule out other causes first  Obstructive sleep apnea causing fatigue in the setting of not using CPAP given known history of sleep apnea    PLAN:  CBC, TSH, T4, B12, Folate  Start Vitron C daily for low iron, iron saturation and low ferritin    Tremor: Patient with complaints of shakes .  These are accompanied by head buzzing sensations at the back of his neck.  Tics are described as oscillating tremors, not tonic-clonic in nature.  He denies any parkinsonian features along with these tremors.  These have not worsened over time.  Neurological exam is significant for sensory changes as noted above.  There are no signs of parkinsonian features on exam today.  Tremor is present bilaterally but worse on the right.  This is most notable when holding an object, pointing and with intention.  This is oscillating in moderate amplitude and frequency.  This is not present at rest.    DDX;  Given the lack of parkinsonian features on exam today, this tremor is most likely essential in nature  Patient is not on medication known to cause tremor  Possibility for hyperthyroidism related tremor    PLAN:  TSH and T4 to rule out endorcine cause  Patient not interested in medication for this at this time.    Back  pain with sciatica: Patient with debilitating back pain as well as shooting pain down his legs.  This has been present for about 4 months.  Pain is bilateral and will shoot down both legs but is worse on the left.  He rates this a 9-10/10.  He is using a rollator now due to pain.  He describes this as electric shocklike and is intense.  This is limiting his activities.  On exam, there is dense sensory deficit present.  Strength is 5 out of 5 today with normal tone.  Gait is gait is wide-based and with the assistance of a rollator.  Heel-to-shin is normal.  There is no hyperreflexia appreciated in the upper extremities, the patellas and Achilles reflexes are absent.  Babinski is normal bilaterally.  Ankle clonus is absent bilaterally crossed adductors present on the right.    DDX:  Radiculopathy possible given back pain, crossed adductor on right. No other UMN signs.     PLAN:  MRI L-spine to evaluate back pain with sciatica.  EMG to better for radiculopathy    Sensory deficit: Patient with sensory deficit noted on exam today.  Sensation is decreased to vibration from the knee on the right and from the toes on the left sensation to pinprick is decreased from the ankle on the right from the toes on the left light touch is intact throughout.    DDX:  DM related neuropathy given hx of DM, onset of neuropathy around time of diagnosis with BG averages in 550+ range  Vitamin deficiency neuropathy given fatigue  Radiculopathy as noted above unlikely causing this sensory deficit     PLAN:  EMG to evaluate sensory deficit  Check B12 and folate today  Can review more extensive labs next time. This was not the primary concern today    Imaging review: Reviewed brain MRI and MRA in detail.  Reviewed microvascular changes noted in white matter and possible etiologies. Patient's questions all answered in detail.     Follow Up:  6 months    Sincerely,  Viviano Simas NP    Note forwarded to Dr. Fabian November for review.     CC: Ledon Snare, *         I personally spent 87 minutes face-to-face and non-face-to-face in the care of this patient, which includes all pre, intra, and post visit time on the date of service.  All documented time was specific to the E/M visit and does not include any procedures that may have been performed.     Portions of this record have been created using Scientist, clinical (histocompatibility and immunogenetics). Dictation errors have been sought, but may not have been identified and corrected.      Subjective:   Subjective        Obtained history from the patient.      HPI: Patient is a 77 y.o. male seen at the request of [name], [name], *. PMH notable for CHF, COPD, DM2, HLD, HTN, neuropathy, A-fib, sarcoidosis, here today for follow up on a variety of concerns    Head sensations went away and now they're back. This is described as a buzzing sensation. This is accompanied by the shakes. He denied any tonic-clonic activity. Shakes have not gotten owrse over time. 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     Wants to go over MRIs    When he turns around neck starts hurting, shocking pain on the left, comes and goes very  quickly. No radiation or weakness.     Knees are bothering him, is seeing ortho for this.    Is concerned about nerves. Has bad pain in back and leg for 4 months. Both sides of back into both legs, worse on left. Rated 9-10/10  When laying in bed and turn quickly he had intense pain. Is needing to use rollator due pain. This is electric shock like, and is intense. This is limiting activities. He is also having falls, he feels his legs tumble under him. Does not want back surgery.     OSA not using CPAP.     Is fatigued. Prior Iron sat 4%, iron low. Impacting activities.     Retired Emergency planning/management officer.     Past Medical Hx, Family Hx, Social Hx, and Problem List has been reviewed in the [employer].        REVIEW OF SYSTEMS: pertinent items noted in HPI          Objective:       Physical Exam:  Blood pressure 115/81, pulse 93, temperature 36.3 ??C (97.3 ??F), temperature source Temporal, weight 95.7 kg (211 lb).       Constitutional:       Appearance: Normal appearance. Well-developed and well-groomed.   Eyes:      Extraocular Movements: EOM normal.      Pupils: Pupils are equal, round, and reactive to light.   Neurological:      Mental Status: Oriented to person, place, and time.      Coordination: Heel to [facility] Test normal.      Deep Tendon Reflexes:      Reflex Scores:       Tricep reflexes are 1+ on the right side and 1+ on the left side.       Bicep reflexes are 1+ on the right side and 1+ on the left side.       Brachioradialis reflexes are 1+ on the right side and 1+ on the left side.       Patellar reflexes are 1+ on the right side and 1+ on the left side.       Achilles reflexes are 0 on the right side and 0 on the left side.  Psychiatric:         Attention and Perception: Attention normal.         Mood and Affect: Mood and affect normal.         Speech: Speech normal.         Behavior: Behavior normal. Behavior is cooperative.         Thought Content: Thought content normal.               Neurologic Exam     Mental Status   Oriented to person, place, and time.   Attention: normal. Concentration: normal.   Speech: speech is normal   Level of consciousness: alert  Knowledge: good.     Cranial Nerves     CN II   Visual fields full to confrontation.     CN III, IV, VI   Pupils are equal, round, and reactive to light.  Extraocular motions are normal.   Right pupil: Consensual response: intact. Accommodation: intact.   Left pupil: Consensual response: intact. Accommodation: intact.   CN III: no CN III palsy  CN VI: no CN VI palsy  Nystagmus: none     CN V   Facial sensation intact.     CN VII  Facial expression full, symmetric.     CN VIII   CN VIII normal.     CN IX, X   CN IX normal.   CN X normal.     CN XI   CN XI normal.     CN XII   CN XII normal.     Motor Exam   Right arm tone: normal  Left arm tone: normal  Right leg tone: normal  Left leg tone: normal    Strength   Right deltoid: 5/5  Left deltoid: 5/5  Right biceps: 5/5  Left biceps: 5/5  Right triceps: 5/5  Left triceps: 5/5  Right wrist flexion: 5/5  Left wrist flexion: 5/5  Right wrist extension: 5/5  Left wrist extension: 5/5  Right interossei: 5/5  Left interossei: 5/5  Right iliopsoas: 5/5  Left iliopsoas: 5/5  Right quadriceps: 5/5  Left quadriceps: 5/5  Right hamstring: 5/5  Left hamstring: 5/5  Right anterior tibial: 5/5  Left anterior tibial: 5/5  Right posterior tibial: 5/5  Left posterior tibial: 5/5    Sensory Exam   Right arm light touch: normal  Left arm light touch: normal  Right leg light touch: normal  Left leg light touch: normal  Right arm vibration: normal  Left arm vibration: normal  Right leg vibration: decreased from knee  Left leg vibration: decreased from toes  Right arm pinprick: normal  Left arm pinprick: normal  Right leg pinprick: decreased from ankle  Left leg pinprick: decreased from toes    Gait, Coordination, and Reflexes     Gait  Gait: wide-based (uses rollator for gait assistance)    Coordination   Heel to shin coordination: normal    Tremor   Resting tremor: absent  Intention tremor: present    Reflexes   Right brachioradialis: 1+  Left brachioradialis: 1+  Right biceps: 1+  Left biceps: 1+  Right triceps: 1+  Left triceps: 1+  Right patellar: 1+  Left patellar: 1+  Right achilles: 0  Left achilles: 0  Right plantar: normal  Left plantar: normal  Right ankle clonus: absent  Left ankle clonus: absent  R>L tremor present, most notable when holding phone, pointing, and with intention. This is oscillating, moderate amplitude and frequency. Not appreciated at rest    RAM of hands intact, no bradykinesia noted    RAM of feet deferred due to pain     Crossed adductor on the RIGHT                    Diagnostic Studies and Review of Records:     MRI / MRA Head 06/2022:   MRI FINDINGS:    Mild global parenchymal volume loss without lobar predominance.  Abnormal signals in the bilateral pons, appearing hypointense on T1-weighted imaging, hyperintense on T2/FLAIR images, with subtle high DWI signal, likely reflecting T2 shine through. There is no associated signal dropout on susceptibility weighted imaging or abnormal postcontrast enhancement. These are nonspecific however may reflect chronic microvascular ischemic changes.  Nonspecific T2/FLAIR hyperintensities in bilateral periventricular and deep cerebral white matter, nonspecific and commonly seen in the setting of chronic microvascular ischemia.  Tiny T1 hypointense, T2 hyperintense foci in bilateral cerebellar hemispheres, consistent with tiny foci of chronic infarct.  Rest of the brain parenchyma return normal signals.  No evidence of acute infarction.  No findings to suggest intracranial hemorrhage.  Mild prominence of the extra-axial CSF spaces.  Basal cisterns are patent.  No extra-axial fluid collection.  No abnormal postcontrast enhancement.  No findings to suggest intracranial mass lesion.  Bilateral pseudophakia. Small left mastoid effusion.    MRA FINDINGS:  The visualized portions of the intracranial internal carotid, vertebral and basilar arteries are unremarkable. There is no high grade stenosis. No aneurysm visualized.  Mild tortuosity of the vertebrobasilar circulation. Left posterior cerebral artery is fetal in origin.  Please refer to separate MRI brain report.

## 2022-08-31 ENCOUNTER — Ambulatory Visit: Admit: 2022-08-31 | Discharge: 2022-09-01 | Payer: MEDICARE

## 2022-08-31 LAB — CBC W/ AUTO DIFF
BASOPHILS ABSOLUTE COUNT: 0.1 10*9/L (ref 0.0–0.1)
BASOPHILS RELATIVE PERCENT: 0.7 %
EOSINOPHILS ABSOLUTE COUNT: 0.4 10*9/L (ref 0.0–0.5)
EOSINOPHILS RELATIVE PERCENT: 4 %
HEMATOCRIT: 39 % (ref 39.0–48.0)
HEMOGLOBIN: 12.7 g/dL — ABNORMAL LOW (ref 12.9–16.5)
LYMPHOCYTES ABSOLUTE COUNT: 1.4 10*9/L (ref 1.1–3.6)
LYMPHOCYTES RELATIVE PERCENT: 13.5 %
MEAN CORPUSCULAR HEMOGLOBIN CONC: 32.6 g/dL (ref 32.0–36.0)
MEAN CORPUSCULAR HEMOGLOBIN: 29.9 pg (ref 25.9–32.4)
MEAN CORPUSCULAR VOLUME: 91.8 fL (ref 77.6–95.7)
MEAN PLATELET VOLUME: 7.6 fL (ref 6.8–10.7)
MONOCYTES ABSOLUTE COUNT: 0.6 10*9/L (ref 0.3–0.8)
MONOCYTES RELATIVE PERCENT: 6.4 %
NEUTROPHILS ABSOLUTE COUNT: 7.5 10*9/L (ref 1.8–7.8)
NEUTROPHILS RELATIVE PERCENT: 75.4 %
NUCLEATED RED BLOOD CELLS: 0 /100{WBCs} (ref <=4)
PLATELET COUNT: 269 10*9/L (ref 150–450)
RED BLOOD CELL COUNT: 4.25 10*12/L — ABNORMAL LOW (ref 4.26–5.60)
RED CELL DISTRIBUTION WIDTH: 21.3 % — ABNORMAL HIGH (ref 12.2–15.2)
WBC ADJUSTED: 10 10*9/L (ref 3.6–11.2)

## 2022-08-31 LAB — COMPREHENSIVE METABOLIC PANEL
ALBUMIN: 3.8 g/dL (ref 3.4–5.0)
ALKALINE PHOSPHATASE: 115 U/L (ref 46–116)
ALT (SGPT): 24 U/L (ref 10–49)
ANION GAP: 5 mmol/L (ref 5–14)
AST (SGOT): 21 U/L (ref <=34)
BILIRUBIN TOTAL: 0.3 mg/dL (ref 0.3–1.2)
BLOOD UREA NITROGEN: 29 mg/dL — ABNORMAL HIGH (ref 9–23)
BUN / CREAT RATIO: 23
CALCIUM: 9.4 mg/dL (ref 8.7–10.4)
CHLORIDE: 104 mmol/L (ref 98–107)
CO2: 33.7 mmol/L — ABNORMAL HIGH (ref 20.0–31.0)
CREATININE: 1.25 mg/dL — ABNORMAL HIGH
EGFR CKD-EPI (2021) MALE: 60 mL/min/{1.73_m2} (ref >=60)
GLUCOSE RANDOM: 123 mg/dL (ref 70–179)
POTASSIUM: 3.9 mmol/L (ref 3.5–5.1)
PROTEIN TOTAL: 7.4 g/dL (ref 5.7–8.2)
SODIUM: 143 mmol/L (ref 135–145)

## 2022-08-31 LAB — VITAMIN B12: VITAMIN B-12: 788 pg/mL (ref 211–911)

## 2022-08-31 LAB — HEMOGLOBIN A1C
ESTIMATED AVERAGE GLUCOSE: 114 mg/dL
HEMOGLOBIN A1C: 5.6 % (ref 4.8–5.6)

## 2022-08-31 LAB — FOLATE: FOLATE: 21.6 ng/mL (ref >=5.4)

## 2022-08-31 LAB — T4, FREE: FREE T4: 0.89 ng/dL (ref 0.89–1.76)

## 2022-08-31 LAB — TSH: THYROID STIMULATING HORMONE: 2.033 u[IU]/mL (ref 0.550–4.780)

## 2022-08-31 MED ORDER — JARDIANCE 10 MG TABLET
ORAL_TABLET | Freq: Every day | ORAL | 1 refills | 90 days | Status: CP
Start: 2022-08-31 — End: ?

## 2022-08-31 NOTE — Unmapped (Addendum)
It was a pleasure to see you today.     Call [facility]iology [phone] to schedule the MRI of your back    Call [facility] Clinical Neurophysiology Lab [phone] to schedule the EMG/Nerve conduction study.     Start taking Vitron C for your low iron. You can get this over the counter.     I am checking some labs today to check for some other causes of fatigue.     We will wait to refer to spine or physical medicine and rehab until your imaging comes back.     Your brain imaging looks really good.       Follow up: Return in about 6 months (around 03/03/2023) for In-person.     If you have any questions in between visits, please send a message via MyChart or you can call our office. MyChart and the Clinic Triage are for non-urgent medical questions. If you are experiencing a medical emergency, please proceed to the closest Emergency Department.     Viviano Simas MSN, APRN, AGPCNP-BC  Nurse Practitioner, General Neurology  [facility] Department of Neurology  [address] [place], [place] [address]  Phone: [phone]   Fax: [phone]

## 2022-08-31 NOTE — Unmapped (Signed)
Patient is requesting the following refill  Requested Prescriptions     Pending Prescriptions Disp Refills    JARDIANCE 10 mg tablet [Pharmacy Med Name: Jardiance 10 MG Oral Tablet] 90 tablet 3     Sig: TAKE 1 TABLET BY MOUTH DAILY       Recent Visits  Date Type Provider Dept   08/02/22 Office Visit Claudean Kinds, MD [facility]   06/03/22 Office Visit Passannante, Braulio Conte, [place] [facility] Acute Care Services   04/21/22 Office Visit Claudean Kinds, MD [facility]   03/27/22 Office Visit [name], [name], [place] [facility] Acute Care Services   03/06/22 Office Visit Claudean Kinds, MD [facility]   12/26/21 Office Visit Claudean Kinds, MD [facility]   12/01/21 Office Visit Claudean Kinds, MD [facility]   11/30/21 Office Visit Gilford Silvius, NP [facility] Acute Care Services   10/24/21 Office Visit Claudean Kinds, MD [facility]   09/22/21 Office Visit Claudean Kinds, MD [facility]   Showing recent visits within past 365 days with a meds authorizing provider and meeting all other requirements  Future Appointments  Date Type Provider Dept   11/06/22 Appointment Criselda Peaches Lidia Collum, MD [facility]   08/06/23 Appointment Criselda Peaches, Lidia Collum, MD [facility]   Showing future appointments within next 365 days with a meds authorizing provider and meeting all other requirements       Labs: A1c:   Hemoglobin A1C (%)   Date Value   04/21/2022 7.2 (H)   09/14/2021 6.5 (A)

## 2022-09-04 ENCOUNTER — Ambulatory Visit
Admission: RE | Admit: 2022-09-04 | Discharge: 2022-09-04 | Disposition: A | Discharge status: Home / Self Care | Payer: 59 | Source: Ambulatory Visit | Attending: Student in an Organized Health Care Education/Training Program | Admitting: Student in an Organized Health Care Education/Training Program

## 2022-09-04 ENCOUNTER — Ambulatory Visit
Payer: 59 | Attending: Student in an Organized Health Care Education/Training Program | Admitting: Student in an Organized Health Care Education/Training Program

## 2022-09-04 ENCOUNTER — Encounter: Payer: Self-pay | Admitting: Student in an Organized Health Care Education/Training Program

## 2022-09-04 VITALS — BP 118/96 | HR 78 | Temp 98.1°F | Resp 15 | Ht 70.0 in | Wt 211.0 lb

## 2022-09-04 DIAGNOSIS — G894 Chronic pain syndrome: Secondary | ICD-10-CM | POA: Diagnosis not present

## 2022-09-04 DIAGNOSIS — M791 Myalgia, unspecified site: Secondary | ICD-10-CM | POA: Insufficient documentation

## 2022-09-04 DIAGNOSIS — M533 Sacrococcygeal disorders, not elsewhere classified: Secondary | ICD-10-CM | POA: Diagnosis not present

## 2022-09-04 DIAGNOSIS — M7918 Myalgia, other site: Secondary | ICD-10-CM | POA: Diagnosis not present

## 2022-09-04 MED ORDER — LIDOCAINE HCL 2 % IJ SOLN
20.0000 mL | Freq: Once | INTRAMUSCULAR | Status: AC
  Administered 2022-09-04: 400 mg
  Filled 2022-09-04: qty 40

## 2022-09-04 MED ORDER — IOHEXOL 180 MG/ML  SOLN
10.0000 mL | Freq: Once | INTRAMUSCULAR | Status: AC
  Administered 2022-09-04: 10 mL via INTRA_ARTICULAR
  Filled 2022-09-04: qty 20

## 2022-09-04 MED ORDER — ROPIVACAINE HCL 2 MG/ML IJ SOLN
9.0000 mL | Freq: Once | INTRAMUSCULAR | Status: AC
  Administered 2022-09-04: 9 mL via PERINEURAL

## 2022-09-04 MED ORDER — METHYLPREDNISOLONE ACETATE 80 MG/ML IJ SUSP
80.0000 mg | Freq: Once | INTRAMUSCULAR | Status: AC
  Administered 2022-09-04: 80 mg via INTRA_ARTICULAR
  Filled 2022-09-04: qty 1

## 2022-09-04 MED ORDER — DEXAMETHASONE SODIUM PHOSPHATE 10 MG/ML IJ SOLN
10.0000 mg | Freq: Once | INTRAMUSCULAR | Status: AC
  Administered 2022-09-04: 10 mg
  Filled 2022-09-04: qty 1

## 2022-09-04 MED ORDER — ROPIVACAINE HCL 2 MG/ML IJ SOLN
9.0000 mL | Freq: Once | INTRAMUSCULAR | Status: AC
  Administered 2022-09-04: 9 mL via INTRA_ARTICULAR
  Filled 2022-09-04: qty 20

## 2022-09-04 NOTE — Progress Notes (Signed)
PROVIDER NOTE: Interpretation of information contained herein should be left [name] medically-trained personnel. Specific patient instructions are provided elsewhere under "Patient Instructions" section of medical record. This document was created in part using STT-dictation technology, any transcriptional errors that may result from this process are unintentional.  Patient: Mario [name] Type: Established DOB: [birth year] MRN: [id number] PCP: Maree Erie, MD  Service: Procedure DOS: 09/04/2022 Setting: Ambulatory Location: Ambulatory outpatient facility Delivery: Face-[name]-face Provider: Edward Jolly, MD Specialty: Interventional Pain Management Specialty designation: 09 Location: Outpatient facility Ref. Prov.: Maree Erie, MD       Interventional Therapy   Procedure: Sacroiliac Joint Steroid Injection #1   & Bilateral Piriformis Injection Laterality: Bilateral     Level: PSIS (Posterior Superior Iliac Spine)  Imaging: Fluoroscopic guidance Anesthesia: Local anesthesia (1-2% Lidocaine)  DOS: 09/04/2022  Performed by: Edward Jolly, MD  Purpose: Diagnostic/Therapeutic Indications: Sacroiliac joint pain in the lower back and hip area severe enough [name] impact quality of life or function. Rationale (medical necessity): procedure needed and proper for the diagnosis and/or treatment of Mario [name] medical symptoms and needs. 1. Sacroiliac joint pain   2. Piriformis muscle pain   3. Chronic pain syndrome    NAS-11 Pain score:   Pre-procedure: 7 /10   Post-procedure: 0-No pain/10     Target: Interarticular sacroiliac joint. Location: Medial [name] the postero-medial edge of iliac spine. Region: Lumbosacral-sacrococcygeal. Approach: Inferior postero-medial percutaneous approach. Type of procedure: Percutaneous joint injection.  Position / Prep / Materials:  Position: Prone  Prep solution: DuraPrep (Iodine Povacrylex [0.7% available iodine] and Isopropyl Alcohol, 74% w/w) Prep Area:  Entire posterior lumbosacral area  Materials:  Tray: Block Needle(s):  Type: Spinal  Gauge (G): 22  Length: 3.5-in Qty: 2  Pre-op H&P Assessment:  Mario [name] is a 77 y.o. (year old), male patient, seen today for interventional treatment. He  has a past surgical history that includes Wrist surgery (Left); Knee surgery (Right); Colonoscopy (08/30/2015); Cataract extraction w/PHACO (Left, 05/12/2019); Finger surgery (Right); and Cataract extraction w/PHACO (Right, 06/02/2019). Mario [name] has a current medication list which includes the following prescription(s): albuterol, apple cider vinegar, atorvastatin, bupropion, celecoxib, duloxetine, edoxaban, empagliflozin, furosemide, gabapentin, gentamicin cream, glucose blood, ipratropium-albuterol, ivabradine, losartan, metformin, metoprolol tartrate, mirtazapine, ozempic (1 mg/dose), prednisone, skyrizi, rocklatan, sotalol, sotalol af, tizanidine, trelegy ellipta, triamcinolone cream, turmeric, and hydrochlorothiazide. His primarily concern today is the Hip Pain (bilateral)  Initial Vital Signs:  Pulse/HCG Rate: 78ECG Heart Rate: 71 Temp: 98.1 F (36.7 C) Resp: 16 BP: 115/76 SpO2: 99 % (2L)  BMI: Estimated body mass index is 30.28 kg/m as calculated from the following:   Height as of this encounter: 5\' 10"  (1.778 m).   Weight as of this encounter: 211 lb (95.7 kg).  Risk Assessment: Allergies: Reviewed. He is allergic [name] rosuvastatin, ace inhibitors, lisinopril, and simbrinza [brinzolamide-brimonidine].  Allergy Precautions: None required Coagulopathies: Reviewed. None identified.  Blood-thinner therapy: None at this time Active Infection(s): Reviewed. None identified. Mario [name] is afebrile  Site Confirmation: Mario [name] was asked [name] confirm the procedure and laterality before marking the site Procedure checklist: Completed Consent: Before the procedure and under the influence of no sedative(s), amnesic(s), or anxiolytics, the patient was informed  of the treatment options, risks and possible complications. [name] fulfill our ethical and legal obligations, as recommended by the American Medical Association's Code of Ethics, I have informed the patient of my clinical impression; the nature and purpose of the treatment or procedure; the risks, benefits, and possible complications of  the intervention; the alternatives, including doing nothing; the risk(s) and benefit(s) of the alternative treatment(s) or procedure(s); and the risk(s) and benefit(s) of doing nothing. The patient was provided information about the general risks and possible complications associated with the procedure. These may include, but are not limited [name]: failure [name] achieve desired goals, infection, bleeding, organ or nerve damage, allergic reactions, paralysis, and death. In addition, the patient was informed of those risks and complications associated [name] the procedure, such as failure [name] decrease pain; infection; bleeding; organ or nerve damage with subsequent damage [name] sensory, motor, and/or autonomic systems, resulting in permanent pain, numbness, and/or weakness of one or several areas of the body; allergic reactions; (i.e.: anaphylactic reaction); and/or death. Furthermore, the patient was informed of those risks and complications associated with the medications. These include, but are not limited [name]: allergic reactions (i.e.: anaphylactic or anaphylactoid reaction(s)); adrenal axis suppression; blood sugar elevation that in diabetics may result in ketoacidosis or comma; water retention that in patients with history of congestive heart failure may result in shortness of breath, pulmonary edema, and decompensation with resultant heart failure; weight gain; swelling or edema; medication-induced neural toxicity; particulate matter embolism and blood vessel occlusion with resultant organ, and/or nervous system infarction; and/or aseptic necrosis of one or more joints. Finally, the patient  was informed that Medicine is not an exact science; therefore, there is also the possibility of unforeseen or unpredictable risks and/or possible complications that may result in a catastrophic outcome. The patient indicated having understood very clearly. We have given the patient no guarantees and we have made no promises. Enough time was given [name] the patient [name] ask questions, all of which were answered [name] the patient's satisfaction. Mr. Wilder has indicated that he wanted [name] continue with the procedure. Attestation: I, the ordering provider, attest that I have discussed with the patient the benefits, risks, side-effects, alternatives, likelihood of achieving goals, and potential problems during recovery for the procedure that I have provided informed consent. Date  Time: 09/04/2022  8:44 AM  Pre-Procedure Preparation:  Monitoring: As per clinic protocol. Respiration, ETCO2, SpO2, BP, heart rate and rhythm monitor placed and checked for adequate function Safety Precautions: Patient was assessed for positional comfort and pressure points before starting the procedure. Time-out: I initiated and conducted the "Time-out" before starting the procedure, as per protocol. The patient was asked [name] participate by confirming the accuracy of the "Time Out" information. Verification of the correct person, site, and procedure were performed and confirmed by me, the nursing staff, and the patient. "Time-out" conducted as per Joint Commission's Universal Protocol (UP.01.01.01). Time: 1007 Start Time: 1007 hrs.  Description/Narrative of Procedure:          Start Time: 1007 hrs.  Rationale (medical necessity): procedure needed and proper for the diagnosis and/or treatment of the patient's medical symptoms and needs. Procedural Technique Safety Precautions: Aspiration looking for blood return was conducted prior [name] all injections. At no point did we inject any substances, as a needle was being advanced. No attempts were  made at seeking any paresthesias. Safe injection practices and needle disposal techniques used. Medications properly checked for expiration dates. SDV (single dose vial) medications used. Description of the Procedure: Protocol guidelines were followed. The patient was assisted into a comfortable position. The target area was identified and the area prepped in the usual manner. Skin & deeper tissues infiltrated with local anesthetic. Appropriate amount of time allowed [name] pass for local anesthetics [name] take effect. The procedure needles  were then advanced [name] the target area. Proper needle placement secured. Negative aspiration confirmed. Solution injected in intermittent fashion, asking for systemic symptoms every 0.5cc of injectate. The needles were then removed and the area cleansed, making sure [name] leave some of the prepping solution back [name] take advantage of its long term bactericidal properties.  Technical description of procedure:  Fluoroscopy using a posterior anterior 45 degree angle from the midline aiming at the anterolateral aspect of the patient was used [name] find a direct path into the sacroiliac joint, the superior medial [name] posterior superior iliac spine.  The skin was marked where the desired target and the skin infiltrated with local anesthetics.  The procedure needle was then advanced until the joint was entered.  Once inside of the joint, we then proceeded [name] inject the desired solution.  8 cc solution made of 7cc of 0.2% ropivacaine, 1 cc of methylprednisolone, 80 mg/cc.  4 cc injected into the left SI joint, 4 cc injected into the right SI joint  Afterwards a right piriformis trigger point injection was done 1 cm inferior, 1 cm deep, 1 cm lateral [name] the inferior fissure of the SI joint.  Contrast was injected [name] confirm piriformis muscle striation.  8 cc solution made of 7 cc of 0.2% ropivacaine, 1 cc of Decadron 10 mg/cc.  4 cc injected; while injecting, patient did not complain of any pain  radiating down her leg.  Afterwards a left piriformis trigger point injection was done 1 cm inferior, 1 cm deep, 1 cm lateral [name] the inferior fissure of the SI joint.  Contrast was injected [name] confirm piriformis muscle striation.   4 cc of above solution injected; while injecting, patient did not complain of any pain radiating down her leg.    Vitals:   09/04/22 1002 09/04/22 1007 09/04/22 1012 09/04/22 1015  BP: 117/82 118/78 111/74 (!) 118/96  Pulse:      Resp: 17 18 16 15   Temp:      SpO2: 95% 91% 96% 95%  Weight:      Height:         End Time: 1014 hrs.  Imaging Guidance (Non-Spinal):          Type of Imaging Technique: Fluoroscopy Guidance (Non-Spinal) Indication(s): Assistance in needle guidance and placement for procedures requiring needle placement in or near specific anatomical locations not easily accessible without such assistance. Exposure Time: Please see nurses notes. Contrast: None used. Fluoroscopic Guidance: I was personally present during the use of fluoroscopy. "Tunnel Vision Technique" used [name] obtain the best possible view of the target area. Parallax error corrected before commencing the procedure. "Direction-depth-direction" technique used [name] introduce the needle under continuous pulsed fluoroscopy. Once target was reached, antero-posterior, oblique, and lateral fluoroscopic projection used confirm needle placement in all planes. Images permanently stored in EMR. Interpretation: No contrast injected. I personally interpreted the imaging intraoperatively. Adequate needle placement confirmed in multiple planes. Permanent images saved into the patient's record.  Post-operative Assessment:  Post-procedure Vital Signs:  Pulse/HCG Rate: 7870 Temp: 98.1 F (36.7 C) Resp: 15 BP: (!) 118/96 SpO2: 95 %  EBL: None  Complications: No immediate post-treatment complications observed by team, or reported by patient.  Note: The patient tolerated the entire procedure  well. A repeat set of vitals were taken after the procedure and the patient was kept under observation following institutional policy, for this type of procedure. Post-procedural neurological assessment was performed, showing return [name] baseline, prior [name] discharge. The patient was provided  with post-procedure discharge instructions, including a section on how [name] identify potential problems. Should any problems arise concerning this procedure, the patient was given instructions [name] immediately contact us, at any time, without hesitation. In any case, we plan [name] contact the patient by telephone for a follow-up status report regarding this interventional procedure.  Comments:  No additional relevant information.  Plan of Care (POC)  Orders:  Orders Placed This Encounter  Procedures   DG PAIN CLINIC C-ARM 1-60 MIN NO REPORT    Intraoperative interpretation by procedural physician at [facility].    Standing Status:   Standing    Number of Occurrences:   1    Order Specific Question:   Reason for exam:    Answer:   Assistance in needle guidance and placement for procedures requiring needle placement in or near specific anatomical locations not easily accessible without such assistance.     Medications ordered for procedure: Meds ordered this encounter  Medications   iohexol (OMNIPAQUE) 180 MG/ML injection 10 mL    Must be Myelogram-compatible. If not available, you may substitute with a water-soluble, non-ionic, hypoallergenic, myelogram-compatible radiological contrast medium.   lidocaine (XYLOCAINE) 2 % (with pres) injection 400 mg   dexamethasone (DECADRON) injection 10 mg   methylPREDNISolone acetate (DEPO-MEDROL) injection 80 mg   ropivacaine (PF) 2 mg/mL (0.2%) (NAROPIN) injection 9 mL   ropivacaine (PF) 2 mg/mL (0.2%) (NAROPIN) injection 9 mL   Medications administered: We administered iohexol, lidocaine, dexamethasone, methylPREDNISolone acetate, ropivacaine (PF) 2 mg/mL  (0.2%), and ropivacaine (PF) 2 mg/mL (0.2%).  See the medical record for exact dosing, route, and time of administration.  Follow-up plan:   Return in about 4 weeks (around 10/02/2022) for Post Procedure Evaluation, in person.       Bilateral genicular nerve block, 06/25/2019 #1, R L4/5 ESI 10/26/21, 12/21/21, bilateral L3, L4, L5 medial branch nerve block 01/09/2022, bilateral SI joint injection and piriformis injection       Recent Visits No visits were found meeting these conditions. Showing recent visits within past 90 days and meeting all other requirements Today's Visits Date Type Provider Dept  09/04/22 Procedure visit Edward Jolly, MD [facility]  Showing today's visits and meeting all other requirements Future Appointments Date Type Provider Dept  10/02/22 Appointment Edward Jolly, MD [facility]  Showing future appointments within next 90 days and meeting all other requirements  Disposition: Discharge home  Discharge (Date  Time): 09/04/2022; 1030 hrs.   Primary Care Physician: Maree Erie, MD Location: [facility] Note by: Edward Jolly, MD (TTS technology used. I apologize for any typographical errors that were not detected and corrected.) Date: 09/04/2022; Time: 10:57 AM  Disclaimer:  Medicine is not an Visual merchandiser. The only guarantee in medicine is that nothing is guaranteed. It is important [name] note that the decision [name] proceed with this intervention was based on the information collected from the patient. The Data and conclusions were drawn from the patient's questionnaire, the interview, and the physical examination. Because the information was provided in large part by the patient, it cannot be guaranteed that it has not been purposely or unconsciously manipulated. Every effort has been made [name] obtain as much relevant data as possible for this evaluation. It is important [name] note that the conclusions that lead [name] this  procedure are derived in large part from the available data. Always take into account that the treatment will also be dependent on availability of resources  and existing treatment guidelines, considered by other Pain Management Practitioners as being common knowledge and practice, at the time of the intervention. For Medico-Legal purposes, it is also important [name] point out that variation in procedural techniques and pharmacological choices are the acceptable norm. The indications, contraindications, technique, and results of the above procedure should only be interpreted and judged by a Board-Certified Interventional Pain Specialist with extensive familiarity and expertise in the same exact procedure and technique.

## 2022-09-04 NOTE — Progress Notes (Signed)
Safety precautions to be maintained throughout the outpatient stay will include: orient to surroundings, keep bed in low position, maintain call bell within reach at all times, provide assistance with transfer out of bed and ambulation.  

## 2022-09-04 NOTE — Patient Instructions (Signed)
____________________________________________________________________________________________  Post-Procedure Discharge Instructions  Instructions: Apply ice:  Purpose: This will minimize any swelling and discomfort after procedure.  When: Day of procedure, as soon as you get home. How: Fill a plastic sandwich bag with crushed ice. Cover it with a small towel and apply to injection site. How long: (15 min on, 15 min off) Apply for 15 minutes then remove x 15 minutes.  Repeat sequence on day of procedure, until you go to bed. Apply heat:  Purpose: To treat any soreness and discomfort from the procedure. When: Starting the next day after the procedure. How: Apply heat to procedure site starting the day following the procedure. How long: May continue to repeat daily, until discomfort goes away. Food intake: Start with clear liquids (like water) and advance to regular food, as tolerated.  Physical activities: Keep activities to a minimum for the first 8 hours after the procedure. After that, then as tolerated. Driving: If you have received any sedation, be responsible and do not drive. You are not allowed to drive for 24 hours after having sedation. Blood thinner: (Applies only to those taking blood thinners) You may restart your blood thinner 6 hours after your procedure. Insulin: (Applies only to Diabetic patients taking insulin) As soon as you can eat, you may resume your normal dosing schedule. Infection prevention: Keep procedure site clean and dry. Shower daily and clean area with soap and water. Post-procedure Pain Diary: Extremely important that this be done correctly and accurately. Recorded information will be used to determine the next step in treatment. For the purpose of accuracy, follow these rules: Evaluate only the area treated. Do not report or include pain from an untreated area. For the purpose of this evaluation, ignore all other areas of pain, except for the treated area. After  your procedure, avoid taking a long nap and attempting to complete the pain diary after you wake up. Instead, set your alarm clock to go off every hour, on the hour, for the initial 8 hours after the procedure. Document the duration of the numbing medicine, and the relief you are getting from it. Do not go to sleep and attempt to complete it later. It will not be accurate. If you received sedation, it is likely that you were given a medication that may cause amnesia. Because of this, completing the diary at a later time may cause the information to be inaccurate. This information is needed to plan your care. Follow-up appointment: Keep your post-procedure follow-up evaluation appointment after the procedure (usually 2 weeks for most procedures, 6 weeks for radiofrequencies). DO NOT FORGET to bring you pain diary with you.   Expect: (What should I expect to see with my procedure?) From numbing medicine (AKA: Local Anesthetics): Numbness or decrease in pain. You may also experience some weakness, which if present, could last for the duration of the local anesthetic. Onset: Full effect within 15 minutes of injected. Duration: It will depend on the type of local anesthetic used. On the average, 1 to 8 hours.  From steroids (Applies only if steroids were used): Decrease in swelling or inflammation. Once inflammation is improved, relief of the pain will follow. Onset of benefits: Depends on the amount of swelling present. The more swelling, the longer it will take for the benefits to be seen. In some cases, up to 10 days. Duration: Steroids will stay in the system x 2 weeks. Duration of benefits will depend on multiple posibilities including persistent irritating factors. Side-effects: If present, they   may typically last 2 weeks (the duration of the steroids). Frequent: Cramps (if they occur, drink Gatorade and take over-the-counter Magnesium 450-500 mg once to twice a day); water retention with temporary  weight gain; increases in blood sugar; decreased immune system response; increased appetite. Occasional: Facial flushing (red, warm cheeks); mood swings; menstrual changes. Uncommon: Long-term decrease or suppression of natural hormones; bone thinning. (These are more common with higher doses or more frequent use. This is why we prefer that our patients avoid having any injection therapies in other practices.)  Very Rare: Severe mood changes; psychosis; aseptic necrosis. From procedure: Some discomfort is to be expected once the numbing medicine wears off. This should be minimal if ice and heat are applied as instructed.  Call if: (When should I call?) You experience numbness and weakness that gets worse with time, as opposed to wearing off. New onset bowel or bladder incontinence. (Applies only to procedures done in the spine)  Emergency Numbers: Durning business hours (Monday - Thursday, 8:00 AM - 4:00 PM) (Friday, 9:00 AM - 12:00 Noon): [phone] After hours: [phone] NOTE: If you are having a problem and are unable connect with, or to talk to a provider, then go to your nearest urgent care or emergency department. If the problem is serious and urgent, please call 911. ____________________________________________________________________________________________  Sacroiliac (SI) Joint Injection Patient Information  Description: The sacroiliac joint connects the scrum (very low back and tailbone) to the ilium (a pelvic bone which also forms half of the hip joint).  Normally this joint experiences very little motion.  When this joint becomes inflamed or unstable low back and or hip and pelvis pain may result.  Injection of this joint with local anesthetics (numbing medicines) and steroids can provide diagnostic information and reduce pain.  This injection is performed with the aid of x-ray guidance into the tailbone area while you are lying on your stomach.   You may experience an  electrical sensation down the leg while this is being done.  You may also experience numbness.  We also may ask if we are reproducing your normal pain during the injection.  Conditions which may be treated SI injection:  Low back, buttock, hip or leg pain  Preparation for the Injection:  Do not eat any solid food or dairy products within 8 hours of your appointment.  You may drink clear liquids up to 3 hours before appointment.  Clear liquids include water, black coffee, juice or soda.  No milk or cream please. You may take your regular medications, including pain medications with a sip of water before your appointment.  Diabetics should hold regular insulin (if take separately) and take 1/2 normal NPH dose the morning of the procedure.  Carry some sugar containing items with you to your appointment. A driver must accompany you and be prepared to drive you home after your procedure. Bring all of your current medications with you. An IV may be inserted and sedation may be given at the discretion of the physician. A blood pressure cuff, EKG and other monitors will often be applied during the procedure.  Some patients may need to have extra oxygen administered for a short period.  You will be asked to provide medical information, including your allergies, prior to the procedure.  We must know immediately if you are taking blood thinners (like Coumadin/Warfarin) or if you are allergic to IV iodine contrast (dye).  We must know if you could possible be pregnant.  Possible side   effects:  Bleeding from needle site Infection (rare, may require surgery) Nerve injury (rare) Numbness & tingling (temporary) A brief convulsion or seizure Light-headedness (temporary) Pain at injection site (several days) Decreased blood pressure (temporary) Weakness in the leg (temporary)   Call if you experience:  New onset weakness or numbness of an extremity below the injection site that last more than 8  hours. Hives or difficulty breathing ( go to the emergency room) Inflammation or drainage at the injection site Any new symptoms which are concerning to you  Please note:  Although the local anesthetic injected can often make your back/ hip/ buttock/ leg feel good for several hours after the injections, the pain will likely return.  It takes 3-7 days for steroids to work in the sacroiliac area.  You may not notice any pain relief for at least that one week.  If effective, we will often do a series of three injections spaced 3-6 weeks apart to maximally decrease your pain.  After the initial series, we generally will wait some months before a repeat injection of the same type.  If you have any questions, please call [phone] [facility]   

## 2022-09-05 ENCOUNTER — Telehealth: Payer: Self-pay | Admitting: *Deleted

## 2022-09-05 NOTE — Telephone Encounter (Signed)
Attempted to call for post procedure follow-up. Message left. 

## 2022-09-06 LAB — VITAMIN B1, WHOLE BLOOD: VITAMIN B1: 148 nmol/L

## 2022-09-07 MED ORDER — METFORMIN 1,000 MG TABLET
ORAL_TABLET | 1 refills | 0 days | Status: CP
Start: 2022-09-07 — End: ?

## 2022-09-07 NOTE — Unmapped (Signed)
Patient is requesting the following refill  Requested Prescriptions     Pending Prescriptions Disp Refills    metFORMIN (GLUCOPHAGE) 1000 MG tablet [Pharmacy Med Name: metFORMIN HCl 1000 MG Oral Tablet] 100 tablet 2     Sig: TAKE ONE-HALF TABLET BY MOUTH IN THE MORNING AND ONE-HALF TABLET  BY MOUTH IN THE EVENING WITH  MEALS       Recent Visits  Date Type Provider Dept   08/02/22 Office Visit Claudean Kinds, MD [facility]   06/03/22 Office Visit Passannante, Braulio Conte, [place] [facility] Acute Care Services   04/21/22 Office Visit Claudean Kinds, MD [facility]   03/27/22 Office Visit [name], [name], [place] [facility] Acute Care Services   03/06/22 Office Visit Claudean Kinds, MD [facility]   12/26/21 Office Visit Claudean Kinds, MD [facility]   12/01/21 Office Visit Claudean Kinds, MD [facility]   11/30/21 Office Visit Gilford Silvius, NP [facility] Acute Care Services   10/24/21 Office Visit Claudean Kinds, MD [facility]   09/22/21 Office Visit Claudean Kinds, MD [facility]   Showing recent visits within past 365 days with a meds authorizing provider and meeting all other requirements  Future Appointments  Date Type Provider Dept   11/06/22 Appointment Criselda Peaches Lidia Collum, MD [facility]   08/06/23 Appointment Criselda Peaches, Lidia Collum, MD [facility]   Showing future appointments within next 365 days with a meds authorizing provider and meeting all other requirements       Labs: A1c:   Hemoglobin A1C (%)   Date Value   08/31/2022 5.6   09/14/2021 6.5 (A)

## 2022-09-08 LAB — VITAMIN B6: VITAMIN B6: 3 ug/L — ABNORMAL LOW

## 2022-09-13 ENCOUNTER — Ambulatory Visit: Admit: 2022-09-13 | Discharge: 2022-09-13 | Payer: MEDICARE

## 2022-09-20 NOTE — Unmapped (Signed)
[facility] Refill Coordination Note    Specialty Medication(s) to be Shipped:   Inflammatory Disorders: Skyrizi    Other medication(s) to be shipped: No additional medications requested for fill at this time     Paul Velez, DOB: [birth year]  Phone: [phone] (home)       All above HIPAA information was verified with patient.     Was a Nurse, learning disability used for this call? No    Completed refill call assessment today to schedule patient's medication shipment from the [facility] [phone]).  All relevant notes have been reviewed.     Specialty medication(s) and dose(s) confirmed: Regimen is correct and unchanged.   Changes to medications: [name] reports no changes at this time.  Changes to insurance: No  New side effects reported not previously addressed with a pharmacist or physician: None reported  Questions for the pharmacist: No    Confirmed patient received a Conservation officer, historic buildings and a Surveyor, mining with first shipment. The patient will receive a drug information handout for each medication shipped and additional FDA Medication Guides as required.       DISEASE/MEDICATION-SPECIFIC INFORMATION        For patients on injectable medications: Patient currently has 0 doses left.  Next injection is scheduled for 10/03/2022.    SPECIALTY MEDICATION ADHERENCE     Medication Adherence    Patient reported X missed doses in the last month: 0  Specialty Medication: risankizumab-rzaa (SKYRIZI) 150 mg/mL PnIj  Patient is on additional specialty medications: No  Informant: patient              Were doses missed due to medication being on hold? No    Skyrizi 150 mg/ml: 0 days of medicine on hand       REFERRAL TO PHARMACIST     Referral to the pharmacist: Not needed      [name]     Shipping address confirmed in Epic.     Delivery Scheduled: Yes, Expected medication delivery date: 09/27/2022.     Medication will be delivered via Same Day Courier to the prescription address in Epic WAM.    [name]   [name]

## 2022-09-23 ENCOUNTER — Ambulatory Visit: Admit: 2022-09-23 | Discharge: 2022-09-24 | Payer: MEDICARE

## 2022-09-23 DIAGNOSIS — E1169 Type 2 diabetes mellitus with other specified complication: Principal | ICD-10-CM

## 2022-09-23 DIAGNOSIS — M549 Dorsalgia, unspecified: Principal | ICD-10-CM

## 2022-09-23 DIAGNOSIS — M5430 Sciatica, unspecified side: Principal | ICD-10-CM

## 2022-09-23 DIAGNOSIS — R5383 Other fatigue: Principal | ICD-10-CM

## 2022-09-23 NOTE — Unmapped (Addendum)
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 He is being followed by pain management and neurology for this.    Ongoing daily fatigue. Discussed recent lab work ordered by Neurology. H/o anemia, slightly improved on most recent CBC from 08/31/22. HGB was 12.7. Thyroid tests, folate, vitamin B12 all WNL. He is less active due to his chronic back pain. There is likely an aspect of deconditioning. Discussed PT/group fitness center which he is open to. Plans to discuss options with pcp at upcoming appt. Continue to monitor.    Non fasting POCT Glucose is 230. He has not been taking his Metformin this past week. Discussed restarting this as prescribed and monitoring BS closely. Low carb/low sugar diet. Push clear fluids as tolerated. Discussed when to be seen urgently.     Systolic BP was slightly low at 100/78. Repeated BP improved to 122/70 in left arm, sitting position, with regular adult cuff. Reports this is more c/w with recent home BP reading.      Follow up: No lab or imaging available in clinic today. He has an appointment with pcp scheduled this week on 09/26/22. We discussed when to present to ED or seek sooner follow up visit for above conditions. Patient/caregiver expressed understanding.       HOPI:      Patient ID:  Paul Velez is a 77 y.o. male who presents for evaluation of chronic back pain with sciatica, worse the last few days. Describes pain in b/l buttock that radiates down both legs. Reports some associated numbness, tingling sensation L>R. In wheelchair in clinic today but states he does not use this at home. 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 EMG testing is pending. He is being followed by pain management and neurology for this.     Also reporting some ongoing daily fatigue. He would like to be more active but is having a hard time getting motivated due to both the fatigue and back pain.     H/o DM. He has not been taking his Metformin since 09/16/22. He has been taking Jardiance and Ozempic as prescribed. Reports his BS at home have been between 96-127. He did eat breakfast this morning and has not checked his BS today.    Denies fever/chills. Denies cp or palpitations. Breathing at baseline. On 2L O2 for COPD.  Denies dizziness or lightheadedness. Denies saddle anesthesia or bowel/bladder dysfunction. +active tremor, followed by neurology. +b/l knee pain, followed by ortho.     Review of Systems negative unless otherwise noted in HPI.     Physical Exam:     Labs:  Recent Results (from the past 24 hour(s))   POCT Glucose    Collection Time: 09/23/22  9:31 AM   Result Value Ref Range    Glucose, POC 230 (A) 65 - 179 mg/dL    Glucose Strip Lot Num [id number]     Glucose Strip Exp 102,325     Serial Number         Vitals:    09/23/22 0914   BP: 100/78   BP Site: L Arm  BP Position: Sitting   BP Cuff Size: Large   Pulse: 84   Resp: 21   Temp: 36.6 ??C (97.9 ??F)   TempSrc: Oral   SpO2: 96%       GENERAL: Well appearing, alert, in NAD. Sitting in wheelchair.  HEAD: Normocephalic and atraumatic  EYES: No exophthalmos. EOMI. Lids- No crusting or drainage. Conjunctiva- clear. Sclera- anicteric.   NOSE/MOUTH/THROAT: Mucous membranes moist.  CHEST: Clear to auscultation bilaterally. Respirations are unlabored. On 2L O2 [place].     HEART: RRR w/o murmurs  MUSCULOSKELETAL: Mild TTP to lower back, b/l buttock. Mild muscular atrophy to LE.   INTEGUMENT: Skin is warm and dry.  No rashes.  NEUROLOGIC:  Alert, oriented to person, place and time. Normal speech and strength. Diminished sensation to b/l LE. +tremor with finger to nose.      Past Medical/Surgical History:  Past Medical History:   Diagnosis Date    Allergic     CHF (congestive heart failure) (CMS-HCC)     COPD (chronic obstructive pulmonary disease) (CMS-HCC)     Diabetes type 2, controlled (CMS-HCC)     HLD (hyperlipidemia)     HTN (hypertension)     Neuropathy     OA (osteoarthritis) of knee     PAF (paroxysmal atrial fibrillation) (CMS-HCC)     Sarcoidosis      No past surgical history on file.    Allergies:  He is allergic to rosuvastatin, ace inhibitors, lisinopril, and brinzolamide-brimonidine.    Current Medications:  Current Outpatient Medications   Medication Sig Dispense Refill    metFORMIN (GLUCOPHAGE) 1000 MG tablet TAKE ONE-HALF TABLET BY MOUTH IN THE MORNING AND ONE-HALF TABLET  BY MOUTH IN THE EVENING WITH  MEALS 100 tablet 1    risankizumab-rzaa (SKYRIZI) 150 mg/mL PnIj Inject the contents of 1 pen (150mg ) under the skin every 12 weeks as maintenance. 1 mL 1    albuterol sulfate 90 mcg/actuation AePB Inhale.      amlodipine (NORVASC) 2.5 MG tablet Take 1 tablet (2.5 mg total) by mouth daily. 90 tablet 2    atorvastatin (LIPITOR) 20 MG tablet Take 1 tablet (20 mg total) by mouth daily. 90 tablet 2    celecoxib (CELEBREX) 200 MG capsule Take 1 capsule (200 mg total) by mouth two (2) times a day as needed for pain. (Patient taking differently: Take 2 capsules (400 mg total) by mouth daily as needed for pain.) 180 capsule 3    DULoxetine (CYMBALTA) 60 MG capsule Take 1 capsule (60 mg total) by mouth daily. 90 capsule 2    edoxaban (SAVAYSA) 60 mg tablet Take 1 tablet (60 mg total) by mouth daily. 90 tablet 3    empagliflozin (JARDIANCE) 10 mg tablet TAKE 1 TABLET BY MOUTH DAILY 90 tablet 1    empty container Misc Use as directed to dispose of [employer]. 1 each 2    furosemide (LASIX) 80 MG tablet Take 1 tablet (80 mg total) by mouth daily. 90 tablet 2    gabapentin (NEURONTIN) 800 MG tablet Take 1 tablet (800 mg total) by mouth two (2) times a day. 180 tablet 3    hydroCHLOROthiazide 12.5 MG tablet Take 1 tablet (12.5 mg total) by mouth daily. 90 tablet 2    hydrocortisone 2.5 % cream Apply topically Two (2) times a day. Apply behind your ears. (Patient not taking: Reported on 09/23/2022) 30 g 1    losartan (COZAAR) 50 MG tablet Take 1 tablet (50 mg total)  by mouth daily.      ONETOUCH VERIO TEST STRIPS Strp Use to check blood sugar twice daily as directed. 100 strip 3    ROCKLATAN 0.02-0.005 % Drop Administer 1 drop to both eyes nightly. (Patient not taking: Reported on 09/23/2022)      semaglutide (OZEMPIC) 1 mg/dose (4 mg/3 mL) PnIj injection Inject 1 mg under the skin every seven (7) days. 9 mL 1    sotalol (BETAPACE) 80 MG tablet Take 1 tablet (80 mg total) by mouth two (2) times a day. (Patient taking differently: Take 1 tablet (80 mg total) by mouth two (2) times a day. 80 mg once daily) 180 tablet 3    tizanidine (ZANAFLEX) 2 MG tablet Take 1 tablet (2 mg total) by mouth nightly as needed (muscle cramps). 30 tablet 1    TRELEGY ELLIPTA 100-62.5-25 mcg inhaler       triamcinolone (KENALOG) 0.1 % cream Apply topically two (2) times a day. For your legs. 454 g 2    umeclidinium (INCRUSE ELLIPTA) 62.5 mcg/actuation inhaler Inhale 1 puff daily.       No current facility-administered medications for this visit.       I have reviewed past medical, surgical, allergies, medications, social and family histories today and updated them in Epic where appropriate.

## 2022-09-24 NOTE — Unmapped (Signed)
Regarding: BP 89/74  ----- Message from Alease Frame, LRT/CTRS  sent at 09/24/2022  4:10 PM EDT -----  If you had not called [facility], what do you think you would have done?   Make Appointment / Call Office

## 2022-09-24 NOTE — Unmapped (Signed)
Copied from CRM #[id number]. Topic: [facility] - Nurse Triage - [facility] Contract Page  >> Sep 24, 2022  4:39 PM Paul Pickle, RN wrote:  Charge nurse paged the on call provider, Dr Almyra Brace, to discuss the page and review SBAR.   MD advised pt to drink plenty of fluid, increase sodium intake for 1 day.  Pt may avoid see in 4 hour disposition and be evaluated at her follow up appt. On 09/26/22 as long as she does not start experiencing and dizziness. If she begins having dizziness she needs to proceed to ED.   Instructions by MD: If patient lives alone, she needs to make sure someone is checking on her regularly. Also to put in place as many fall precautions as she can around her home.   RN called patient to inform them of instructions, patient lives with his girlfriend, Paul Velez.  He verbalized understanding of instruction and had no further questions. Encouraged pt to call back with any new or worsening symptoms.

## 2022-09-24 NOTE — Unmapped (Signed)
S: bp 89/74-asymptomatic, but was seen 5/25 at clinic with low BP, reporting no new symptoms since 5/25, more energy today than over last 2 weeks. Has 5/28 follow up with PCP. Ok to wait to be seen until 5/28  B: dm, copd, on 2L oxygen  A: stable, well.  R: page for second level.     Is this a pediatric patient?   No   Any recent, relevant visit?   Yes   Date/location of recent visit?  5/25 pcp office: back pain, fatigue, BG 230 and told to restart metformin, low sugar diet, BP 100/78    Any related medications?  Yes   Name of medication and dose?  Oxycontin (sciatica)    Any interventions?   No  Reason for Disposition   [1] Systolic BP < 90 AND [2] NOT dizzy, lightheaded or weak    Additional Information   Negative: [1] Drinking very little AND [2] dehydration suspected (e.g., no urine > 12 hours, very dry mouth, very lightheaded)     Last urine: 09/24/22 @ 1600    Answer Assessment - Initial Assessment Questions  1. BLOOD PRESSURE: What is the blood pressure? Did you take at least two measurements 5 minutes apart?  BP 89/74 HR ? 1600 09/24/22  BP #2 -machine would not work.   2. ONSET: When did you take your blood pressure?      See above  3. HOW: How did you obtain the blood pressure? (e.g., visiting nurse, automatic home BP monitor)      Automatic cuff  4. HISTORY: Do you have a history of low blood pressure? What is your blood pressure normally?      Some say that; normally 110/80s  5. MEDICINES: Are you taking any medications for blood pressure? If Yes, ask: Have they been changed recently?      Yes; no changes  6. PULSE RATE: Do you know what your pulse rate is?       See above  7. OTHER SYMPTOMS: Have you been sick recently? Have you had a recent injury?      no  8. PREGNANCY: Is there any chance you are pregnant? When was your last menstrual period?      N/a    Reports not feeling worse than yesterday, actually feeling more energy today than yesterday. BG this am=127    Protocols used: Blood Pressure - Low-A-AH

## 2022-09-26 ENCOUNTER — Ambulatory Visit: Admit: 2022-09-26 | Discharge: 2022-09-27 | Payer: MEDICARE

## 2022-09-26 DIAGNOSIS — E1142 Type 2 diabetes mellitus with diabetic polyneuropathy: Principal | ICD-10-CM

## 2022-09-26 DIAGNOSIS — M549 Dorsalgia, unspecified: Principal | ICD-10-CM

## 2022-09-26 DIAGNOSIS — I152 Hypertension secondary to endocrine disorders: Principal | ICD-10-CM

## 2022-09-26 DIAGNOSIS — E1159 Type 2 diabetes mellitus with other circulatory complications: Principal | ICD-10-CM

## 2022-09-26 DIAGNOSIS — M5430 Sciatica, unspecified side: Principal | ICD-10-CM

## 2022-09-26 NOTE — Unmapped (Addendum)
//   Hypertension: BP at goal?: at goal below 130/80   BP Readings from Last 3 Encounters:   09/26/22 122/77   09/23/22 100/78   08/31/22 115/81     - Last Labs:    Lab Results   Component Value Date    CREATININE 1.25 (H) 08/31/2022    K 3.9 08/31/2022    NA 143 08/31/2022    CO2 33.7 (H) 08/31/2022     - The patient's calculated ASCVD 10 Year Risk Score is 32.1%.   - Treated with: amlodipine (Norvasc) 2.5mg  and losartan (Cozaar) 25mg   and hydrochlorothiazide 25mg . Consider stopping hydrochlorothiazide and continue lasix for fluid control if hypotensive any more. Follow up with cardiology for repeat echo and sotalol decrease to prevent pre-syncope  - Instructed to measure home BP weekly and record; instructional handout given.  - Control dietary sodium and daily exercise.  - Follow up: 3 months

## 2022-09-26 NOTE — Unmapped (Signed)
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

## 2022-09-26 NOTE — Unmapped (Signed)
[facility]  Established Patient Clinic Note    Assessment/Plan:   Problem List Items Addressed This Visit       Diabetic peripheral neuropathy (CMS-HCC)     // Neuropathy 2/2 Diabetic Polyneuropathy  - Gabapentin and Cymbalta  - Unable to start lyrica due to multiple interactions.              Hypertension associated with diabetes (CMS-HCC) - Primary     // Hypertension: BP at goal?: at goal below 130/80   BP Readings from Last 3 Encounters:   09/26/22 122/77   09/23/22 100/78   08/31/22 115/81     - Last Labs:    Lab Results   Component Value Date    CREATININE 1.25 (H) 08/31/2022    K 3.9 08/31/2022    NA 143 08/31/2022    CO2 33.7 (H) 08/31/2022     - The patient's calculated ASCVD 10 Year Risk Score is 32.1%.   - Treated with: amlodipine (Norvasc) 2.5mg  and losartan (Cozaar) 25mg   and hydrochlorothiazide 25mg . Consider stopping hydrochlorothiazide and continue lasix for fluid control if hypotensive any more. Follow up with cardiology for repeat echo and sotalol decrease to prevent pre-syncope  - Instructed to measure home BP weekly and record; instructional handout given.  - Control dietary sodium and daily exercise.  - Follow up: 3 months           Sciatic leg pain     //Back Pain likely due to degenerative disc disease with MRI evidence of stenosis at L3-S1  - Follows with PT and pain Center s/p injections.   - Pain control with gabapentin, Cymbalta, Tylneol and injections   - Refer to spine center to consider RFA vs. Surgery.                   Relevant Orders    Ambulatory referral to Spine Center     Other Visit Diagnoses       Back pain with sciatica        Relevant Orders    Ambulatory referral to Spine Center            HEALTH MAINTENANCE ITEMS STILL DUE:  Health Maintenance Due   Topic Date Due    COPD Spirometry  Never done    Retinal Eye Exam  Never done    Zoster Vaccines (1 of 2) Never done    DTaP/Tdap/Td Vaccines (1 - Tdap) 10/31/2006    Urine Albumin/Creatinine Ratio  11/08/2021 Subjective   Paul Velez is a 77 y.o. male  coming to clinic today for the following issues:    Chief Complaint   Patient presents with    [facility] follow up      Legs, buttocks and feet hurt from sciatica, per pt  Also per pt some urinary incontinence          HPI    Pain shotting from back to hips and down legs.     Once he feels the need to go to bathroom he will go. Cannot hold it very lonng.     Losing weight in legs and feels weak.     Nurse triage call and low BP. Asymtpomatic.     Paul Velez is a 77 y.o. male, with the following medical problems as documented above:      ROS    I have reviewed the problem list, medications, and allergies and have updated/reconciled them if needed.    Paul Velez  reports that he quit smoking about 53 years ago. His smoking use included cigarettes. He has never used smokeless tobacco.    Objective     VITALS: BP 122/77  - Pulse 93  - Resp 16  - Wt 94.9 kg (209 lb 3.2 oz)  - SpO2 97%  - BMI 30.02 kg/m??     Wt Readings from Last 6 Encounters:   09/26/22 94.9 kg (209 lb 3.2 oz)   08/31/22 95.7 kg (211 lb)   05/19/22 89.4 kg (197 lb)   11/30/21 (!) 107.5 kg (237 lb)   09/22/21 (!) 108 kg (238 lb)   09/14/21 (!) 107.5 kg (237 lb)        Physical Exam  Vitals reviewed.   Constitutional:       General: He is not in acute distress.     Appearance: Normal appearance. He is well-developed. He is not diaphoretic.   HENT:      Head: Normocephalic and atraumatic.      Right Ear: Hearing normal.      Left Ear: Hearing normal.      Nose: Nose normal.   Eyes:      General: No scleral icterus.        Right eye: No discharge.         Left eye: No discharge.      Conjunctiva/sclera: Conjunctivae normal.      Right eye: Right conjunctiva is not injected.      Left eye: Left conjunctiva is not injected.   Neck:      Thyroid: No thyroid mass.      Vascular: No carotid bruit or JVD.      Trachea: Trachea and phonation normal.   Cardiovascular:      Rate and Rhythm: Normal rate and regular rhythm. No extrasystoles are present.     Pulses: Normal pulses.           Radial pulses are 2+ on the right side and 2+ on the left side.      Heart sounds: Murmur heard.   Pulmonary:      Effort: Pulmonary effort is normal. No respiratory distress.      Breath sounds: Wheezing present. No rales.      Comments: Oxygen 2L [place]    Musculoskeletal:         General: No deformity. Normal range of motion.      Cervical back: Normal range of motion.      Right lower leg: No edema.      Left lower leg: No edema.   Skin:     General: Skin is warm and dry.      Capillary Refill: Capillary refill takes less than 2 seconds.      Findings: Lesion and rash present. No bruising or erythema.      Nails: There is no clubbing.   Neurological:      Mental Status: He is alert and oriented to person, place, and time.      Cranial Nerves: No cranial nerve deficit.      Motor: Weakness present.      Gait: Gait normal.   Psychiatric:         Mood and Affect: Mood normal. Mood is not anxious.         Speech: Speech normal.         Behavior: Behavior normal.         Thought Content: Thought content normal.  Judgment: Judgment normal.         LABS/IMAGING  I have reviewed pertinent recent labs and imaging in Epic    Veatrice Kells, MD  [facility]  [facility]   [address] [place], [place] [address]  Telephone [phone]  Fax [phone]

## 2022-09-26 NOTE — Unmapped (Signed)
//  Back Pain likely due to degenerative disc disease with MRI evidence of stenosis at L3-S1  - Follows with PT and pain Center s/p injections.   - Pain control with gabapentin, Cymbalta, Tylneol and injections   - Refer to spine center to consider RFA vs. Surgery.

## 2022-09-27 MED FILL — SKYRIZI 150 MG/ML SUBCUTANEOUS PEN INJECTOR: SUBCUTANEOUS | 84 days supply | Qty: 1 | Fill #1

## 2022-10-02 ENCOUNTER — Ambulatory Visit: Payer: 59 | Admitting: Student in an Organized Health Care Education/Training Program

## 2022-10-12 ENCOUNTER — Ambulatory Visit: Admit: 2022-10-12 | Discharge: 2022-10-13 | Payer: MEDICARE

## 2022-10-12 DIAGNOSIS — M48062 Spinal stenosis, lumbar region with neurogenic claudication: Principal | ICD-10-CM

## 2022-10-12 DIAGNOSIS — M5430 Sciatica, unspecified side: Principal | ICD-10-CM

## 2022-10-12 NOTE — Unmapped (Signed)
ORTHOPAEDIC SPINE CLINIC NOTE       Paul Bimler, PA-C  Physician Assistant  [email or web address]  [phone]        Patient Name:Paul Velez  MRN: [id number]  DOB: 13-Jan-1946    Date: 10/12/2022    PCP: Paul Kinds, MD    ASSESSMENT:   Paul Velez  77 y.o. male  [id number]  COPD. CHF. DM. HTN.     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 L3-4 canal stenosis    MR-C/T ordered to r/o myelopathy given balance issues and hand weakness      PLAN and RECOMMENDATIONS:     Options reviewed with patient.  We will obtain a cervical and thoracic MRI to evaluate for any spinal cord compression.  In regards to his significant spinal stenosis we have referred him to physical therapy at this time to exhaust all of our conservative care first.    Future Considerations:  - Surgical consult      Patient Instructions   Medications: Take over-the-counter medications as needed and as tolerated  Activity: Activities as tolerated.  Conservative Care: Let's continue to monitor your symptoms.  We referred you to Physical Therapy.  Surgical Care: No surgical indications at this time.  Imaging studies: MRI of the Cervical and Thoracic spine was ordered. Medical necessity: Radiographs have been performed/ordered. Presence of red flag findings as noted on our evaluation.  Labs: None  Return for Next scheduled follow up after MRI.     Contact our nursing team via MyChart or telephone with any clinical questions/concerns.  Our scheduling team and support staff can be reached at [phone].     Orders Placed This Encounter   Procedures    MRI Cervical Spine Wo Contrast    MRI Thoracic Spine Wo Contrast    Ambulatory referral to Physical Therapy          SUBJECTIVE:     History of Present Illness:   Paul Velez is a 77 y.o. male with relevant PMH of CHF, COPD, DM, HTN seen in consultation at the request of Paul Velez* for evaluation of low back pain, difficulty walking .   Date of Onset: 4 months    Patient presents today with acute on chronic low back pain that is gradually worsened over the past 4 months without any inciting event or injury.  Has recently started using a rolling walker to help with stability and finds that leaning forward on it helps him walk and relieve some of his symptoms.  He endorses numbness and tingling down both legs, right greater than left as well as his right leg giving out on him when he is trying to walk.  Positive rest relief.    Last 3-4 months balance has been worsening and has fell about 7 times.     Had Lumbar ESI x 3 with no relief. Feels that PT made his symptoms worse.    Denies any bowel/bladder incontinence.    Functional Limitations Include: Difficulty walking    Current Treatments Previous Treatments   Current Relevant Pain Medications:      Current Physical Therapy: No recent PT    Adjunct Treatments: Activity Modification    In a Pain Clinic: No Prior Relevant Pain Medications:    Prior Physical Therapy: None.    Injections:Yes.  Lumbar ESI    Prior Relevant Surgeries: None.        Medical History  He  has a past medical history of Allergic, CHF (congestive heart failure) (CMS-HCC), COPD (chronic obstructive pulmonary disease) (CMS-HCC), Diabetes type 2, controlled (CMS-HCC), HLD (hyperlipidemia), HTN (hypertension), Neuropathy, OA (osteoarthritis) of knee, PAF (paroxysmal atrial fibrillation) (CMS-HCC), and Sarcoidosis.     Surgical History   He  has no past surgical history on file.     Allergies   Rosuvastatin, Ace inhibitors, Lisinopril, and Brinzolamide-brimonidine   Medications   He has a current medication list which includes the following prescription(s): albuterol sulfate, amlodipine, atorvastatin, duloxetine, edoxaban, jardiance, empty container, furosemide, gabapentin, hydrochlorothiazide, losartan, metformin, onetouch verio test strips, skyrizi, rocklatan, ozempic, tizanidine, trelegy ellipta, triamcinolone, umeclidinium, celecoxib, hydrocortisone, and sotalol.   Review of Systems A 10-system review was performed by questionnaire and noted in the electronic chart.  Positives noted/discussed.  Balance of systems was negative.    Fever/chills: denies  Recent unexplained weight loss: denies  Bowel/bladder symptoms: denies   Family History His family history is not on file.     Social History He  reports that he quit smoking about 53 years ago. His smoking use included cigarettes. He has never used smokeless tobacco. He reports that he does not currently use alcohol.        Occupational History    Not on file        OBJECTIVE:     PHYSICAL EXAM:  Vitals: Temp 36.6 ??C (97.9 ??F) (Skin)  - Ht 177.8 cm (5' 10)  - Wt 93.9 kg (207 lb)  - BMI 29.70 kg/m??   Appearance: well-nourished and no acute distress   Skin: No cyanosis or clubbing in bilat hands. and No edema in the feet.  Pulses: 2+ DP pulses bilaterally     Neurologic exam:   Mental Status: Paul Velez is oriented to time, place, and person & is alert and cooperative  Motor: Normal bulk and tone  Gait: slow, using walker .  Unable to perform toe and heel gait.    Motor R L  Reflexes R L   IP 4 4  Patellar 1-2+ 1-2+   Quad 4 4  Achilles 1-2+ 1-2+       Pathologic R L   TA 5 5  Hoffmann's neg. sublty pos.   EHL 4 4  Clonus neg. neg.   GS 5 5  Babinski neg. neg.   UE: 5/5 deltoid, 5/5 bicep, 4/5 tricep , 5/5 WE , 4/5 grip and IO bilaterally  Sensory: Sensation to light touch intact throughout.    Thoracolumbar Spine Exam:  Inspection: Normal skin. No swelling, erythema, deformity, atrophy or hypertrophy noted  Lumbar Spine ROM: severely restricted  Spine Palpation: diffusely tender to palpation  Nerve Tension Signs: Slump test positive right  and left    Sacroiliac Joint:   Inspection: Normal alignment. No erythema, discoloration, or asymmetry.  Palpation: No tenderness at PSIS    Right Hip/Knee:   No pain with hip internal rotation/loading. Normal internal rotation ROM.   No knee pain with flexion/extension. No atrophy noted upon inspection. Normal skin. Normal ROM and stability.  Left Hip/Knee:   No pain with hip internal rotation/loading. Normal internal rotation ROM.   No knee pain with flexion/extension. No atrophy noted upon inspection. Normal skin. Normal ROM and stability.       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  Images were reviewed with the patient on the PACS monitor. The available reports were reviewed.    Labs:  Lab Results   Component Value Date    A1C 5.6 08/31/2022       Discussion:  Clinical findings, diagnostic/treatment options, and plan were discussed with the patient.  Activities - Advised activities as tolerated, using pain as a guide.  Activities - Encouraged walking  Conservative Care - Options discussed.  Conservative Care - Warning symptoms were discussed.  Imaging - Discussed pros/cons of advanced imaging options.      E&M Coding:    MEDICAL DECISION MAKING (level of service defined by 2/3 elements)     Number/Complexity of Problems Addressed 1 or more chronic illnesses with severe exacerbation, progression, or side effects of treatment (99205/99215)   Amount/Complexity of Data to be Reviewed/Analyzed Meets at least 2 of the 3 MODERATE criteria above (99205/99215)   Risk of Complications/Morbidity/Mortality of Management --LOW Risk of Morbidity from Additional Diagnostic Testing or Treatment (99203/99213)--     Or TIME     Total Time for E/M Services on the Date of Encounter N/A          cc: Pennelope Bracken, MD

## 2022-10-12 NOTE — Unmapped (Addendum)
Medications: Take over-the-counter medications as needed and as tolerated  Activity: Activities as tolerated.  Conservative Care: Let's continue to monitor your symptoms.  We referred you to Physical Therapy.  Surgical Care: No surgical indications at this time.  Imaging studies: MRI of the Cervical and Thoracic spine was ordered. Medical necessity: Radiographs have been performed/ordered. Presence of red flag findings as noted on our evaluation.  Labs: None  Return for Next scheduled follow up after MRI.     Contact our nursing team via MyChart or telephone with any clinical questions/concerns.  Our scheduling team and support staff can be reached at [phone].

## 2022-10-25 NOTE — Unmapped (Unsigned)
[facility]  Established Patient Clinic Note    Assessment/Plan:   No problem-specific Assessment & Plan notes found for this encounter.      HEALTH MAINTENANCE ITEMS STILL DUE:  Health Maintenance Due   Topic Date Due    COPD Spirometry  Never done    Retinal Eye Exam  Never done    Zoster Vaccines (1 of 2) Never done    DTaP/Tdap/Td Vaccines (1 - Tdap) 10/31/2006    Urine Albumin/Creatinine Ratio  11/08/2021    COVID-19 Vaccine (6 - 2023-24 season) 04/02/2022       PHQ-2 Score:      PHQ-9 Score:      Paul Velez Score:      {select_status_or_delete_smartlist:64641}    Subjective   Mr. Wentzell is a 77 y.o. male  coming to clinic today for the following issues:    No chief complaint on file.      HPI    ***    Paul Velez is a 77 y.o. male, with the following medical problems as documented above:      ROS    I have reviewed the problem list, medications, and allergies and have updated/reconciled them if needed.    Mr. Hawkes  reports that he quit smoking about 53 years ago. His smoking use included cigarettes. He has never used smokeless tobacco.    Objective     VITALS: There were no vitals taken for this visit.    Wt Readings from Last 6 Encounters:   10/12/22 93.9 kg (207 lb)   09/26/22 94.9 kg (209 lb 3.2 oz)   08/31/22 95.7 kg (211 lb)   05/19/22 89.4 kg (197 lb)   11/30/21 (!) 107.5 kg (237 lb)   09/22/21 (!) 108 kg (238 lb)        Physical Exam    LABS/IMAGING  I have reviewed pertinent recent labs and imaging in Epic    Veatrice Kells, MD  [facility]  [facility]   [address] [place], [place] [address]  Telephone [phone]  Fax [phone]

## 2022-11-01 DIAGNOSIS — E1169 Type 2 diabetes mellitus with other specified complication: Principal | ICD-10-CM

## 2022-11-01 MED ORDER — ONETOUCH VERIO TEST STRIPS
ORAL_STRIP | 3 refills | 0 days | Status: CP
Start: 2022-11-01 — End: ?

## 2022-11-01 NOTE — Unmapped (Addendum)
Patient notified rx was sent.    Patient is requesting the following refill  Requested Prescriptions     Pending Prescriptions Disp Refills    ONETOUCH VERIO TEST STRIPS Strp 100 strip 3     Sig: Use to check blood sugar twice daily as directed.       Recent Visits  Date Type Provider Dept   09/26/22 Office Visit Claudean Kinds, MD [facility]   09/23/22 Office Visit [name], [name], [place] [facility] Acute Care Services   08/02/22 Office Visit Criselda Peaches, Lidia Collum, MD [facility]   06/03/22 Office Visit Passannante, Braulio Conte, [place] [facility] Acute Care Services   04/21/22 Office Visit Claudean Kinds, MD [facility]   03/27/22 Office Visit [name], [name], [place] [facility] Acute Care Services   03/06/22 Office Visit Claudean Kinds, MD [facility]   12/26/21 Office Visit Claudean Kinds, MD [facility]   12/01/21 Office Visit Claudean Kinds, MD [facility]   11/30/21 Office Visit Gilford Silvius, NP [facility] Acute Care Services   Showing recent visits within past 365 days and meeting all other requirements  Future Appointments  Date Type Provider Dept   11/06/22 Appointment Criselda Peaches Lidia Collum, MD [facility]   08/06/23 Appointment Criselda Peaches, Lidia Collum, MD [facility]   Showing future appointments within next 365 days and meeting all other requirements

## 2022-11-01 NOTE — Unmapped (Signed)
Patient is requesting the following medication(s) for refill: Verio one touch. Only one left.    Pharmacy name and address has been verified.  [facility] in [place].

## 2022-11-06 ENCOUNTER — Ambulatory Visit: Admit: 2022-11-06 | Discharge: 2022-11-06 | Payer: MEDICARE

## 2022-11-06 NOTE — Unmapped (Signed)
//   Hypertension: BP at goal?: at goal below 130/80   BP Readings from Last 3 Encounters:   09/26/22 122/77   09/23/22 100/78   08/31/22 115/81     - Last Labs:    Lab Results   Component Value Date    CREATININE 1.25 (H) 08/31/2022    K 3.9 08/31/2022    NA 143 08/31/2022    CO2 33.7 (H) 08/31/2022     - The patient's calculated ASCVD 10 Year Risk Score is 42.5%.   - Treated with: amlodipine (Norvasc) 2.5mg  and losartan (Cozaar) 25mg   and hydrochlorothiazide 25mg . Consider stopping hydrochlorothiazide and continue lasix for fluid control if hypotensive any more. Follow up with cardiology for repeat echo and sotalol decrease to prevent pre-syncope  - Instructed to measure home BP weekly and record; instructional handout given.  - Control dietary sodium and daily exercise.  - Follow up: 3 months

## 2022-11-06 NOTE — Unmapped (Signed)
//   Diabetes Type 2: controlled  - Recent Labs:   Lab Results   Component Value Date    Hemoglobin A1C 5.6 08/31/2022    Hemoglobin A1C 7.2 (H) 04/21/2022    Hemoglobin A1C 6.7 (H) 12/26/2021    Hemoglobin A1C 6.5 (A) 09/14/2021    Hemoglobin A1C 6.4 (A) 06/24/2021    LDL Calculated 56 12/26/2021      - The 10-year ASCVD risk score (Arnett DK, et al., 2019) is: 42.5% on ASA/Statin Prevention Therapy No.  - Treated with: Semaglutide, jardiance and Metformin (Glucophage) 1000mg  BID  - Plan Discussed general issues about diabetes pathophysiology and management.

## 2022-11-21 ENCOUNTER — Ambulatory Visit: Admit: 2022-11-21 | Discharge: 2022-11-22 | Payer: MEDICARE

## 2022-11-21 DIAGNOSIS — M5430 Sciatica, unspecified side: Principal | ICD-10-CM

## 2022-11-21 DIAGNOSIS — E1169 Type 2 diabetes mellitus with other specified complication: Principal | ICD-10-CM

## 2022-11-21 DIAGNOSIS — I152 Hypertension secondary to endocrine disorders: Principal | ICD-10-CM

## 2022-11-21 DIAGNOSIS — E1159 Type 2 diabetes mellitus with other circulatory complications: Principal | ICD-10-CM

## 2022-11-21 DIAGNOSIS — Z0000 Encounter for general adult medical examination without abnormal findings: Principal | ICD-10-CM

## 2022-11-21 LAB — COMPREHENSIVE METABOLIC PANEL
ALBUMIN: 3.8 g/dL (ref 3.4–5.0)
ALKALINE PHOSPHATASE: 98 U/L (ref 46–116)
ALT (SGPT): 20 U/L (ref 10–49)
ANION GAP: 5 mmol/L (ref 5–14)
AST (SGOT): 24 U/L (ref <=34)
BILIRUBIN TOTAL: 0.3 mg/dL (ref 0.3–1.2)
BLOOD UREA NITROGEN: 22 mg/dL (ref 9–23)
BUN / CREAT RATIO: 19
CALCIUM: 9.9 mg/dL (ref 8.7–10.4)
CHLORIDE: 106 mmol/L (ref 98–107)
CO2: 31.3 mmol/L — ABNORMAL HIGH (ref 20.0–31.0)
CREATININE: 1.17 mg/dL
EGFR CKD-EPI (2021) MALE: 65 mL/min/{1.73_m2} (ref >=60)
GLUCOSE RANDOM: 151 mg/dL (ref 70–179)
POTASSIUM: 4.2 mmol/L (ref 3.4–4.8)
PROTEIN TOTAL: 7.1 g/dL (ref 5.7–8.2)
SODIUM: 142 mmol/L (ref 135–145)

## 2022-11-21 LAB — CBC
HEMATOCRIT: 37.9 % — ABNORMAL LOW (ref 39.0–48.0)
HEMOGLOBIN: 12.5 g/dL — ABNORMAL LOW (ref 12.9–16.5)
MEAN CORPUSCULAR HEMOGLOBIN CONC: 33.1 g/dL (ref 32.0–36.0)
MEAN CORPUSCULAR HEMOGLOBIN: 32.7 pg — ABNORMAL HIGH (ref 25.9–32.4)
MEAN CORPUSCULAR VOLUME: 98.9 fL — ABNORMAL HIGH (ref 77.6–95.7)
MEAN PLATELET VOLUME: 7.9 fL (ref 6.8–10.7)
PLATELET COUNT: 257 10*9/L (ref 150–450)
RED BLOOD CELL COUNT: 3.83 10*12/L — ABNORMAL LOW (ref 4.26–5.60)
RED CELL DISTRIBUTION WIDTH: 15.5 % — ABNORMAL HIGH (ref 12.2–15.2)
WBC ADJUSTED: 8.4 10*9/L (ref 3.6–11.2)

## 2022-11-21 LAB — HEMOGLOBIN A1C
ESTIMATED AVERAGE GLUCOSE: 128 mg/dL
HEMOGLOBIN A1C: 6.1 % — ABNORMAL HIGH (ref 4.8–5.6)

## 2022-11-21 LAB — LIPID PANEL
CHOLESTEROL/HDL RATIO SCREEN: 2.7 (ref 1.0–4.5)
CHOLESTEROL: 126 mg/dL (ref <=200)
HDL CHOLESTEROL: 46 mg/dL (ref 40–60)
LDL CHOLESTEROL CALCULATED: 59 mg/dL (ref 40–99)
NON-HDL CHOLESTEROL: 80 mg/dL (ref 70–130)
TRIGLYCERIDES: 103 mg/dL (ref 0–150)
VLDL CHOLESTEROL CAL: 20.6 mg/dL (ref 12–42)

## 2022-11-21 NOTE — Unmapped (Signed)
//  Back Pain likely due to degenerative disc disease with MRI evidence of stenosis at L3-S1  - Follows with PT and pain Center s/p injections.   - Pain control with gabapentin, Cymbalta, Tylneol and injections   - Refer to spine center to consider RFA vs. Surgery.

## 2022-11-21 NOTE — Unmapped (Signed)
//   Diabetes Type 2: controlled  - Recent Labs:   Lab Results   Component Value Date    Hemoglobin A1C 5.6 08/31/2022    Hemoglobin A1C 7.2 (H) 04/21/2022    Hemoglobin A1C 6.7 (H) 12/26/2021    Hemoglobin A1C 6.5 (A) 09/14/2021    Hemoglobin A1C 6.4 (A) 06/24/2021    LDL Calculated 56 12/26/2021      - The 10-year ASCVD risk score (Arnett DK, et al., 2019) is: 42.5% on ASA/Statin Prevention Therapy No.  - Treated with: Semaglutide, jardiance and Metformin (Glucophage) 1000mg  BID  - Plan Discussed general issues about diabetes pathophysiology and management.

## 2022-11-21 NOTE — Unmapped (Signed)
//   Hypertension: BP at goal?: at goal below 130/80   BP Readings from Last 3 Encounters:   09/26/22 122/77   09/23/22 100/78   08/31/22 115/81     - Last Labs:    Lab Results   Component Value Date    CREATININE 1.25 (H) 08/31/2022    K 3.9 08/31/2022    NA 143 08/31/2022    CO2 33.7 (H) 08/31/2022     - The patient's calculated ASCVD 10 Year Risk Score is 42.5%.   - Treated with: amlodipine (Norvasc) 2.5mg  and losartan (Cozaar) 25mg   and hydrochlorothiazide 25mg . Consider stopping hydrochlorothiazide and continue lasix for fluid control if hypotensive any more. Follow up with cardiology for repeat echo and sotalol decrease to prevent pre-syncope  - Instructed to measure home BP weekly and record; instructional handout given.  - Control dietary sodium and daily exercise.  - Follow up: 3 months

## 2022-12-01 MED ORDER — JARDIANCE 10 MG TABLET
ORAL_TABLET | Freq: Every day | ORAL | 10 refills | 0 days
Start: 2022-12-01 — End: ?

## 2022-12-04 MED ORDER — JARDIANCE 10 MG TABLET
ORAL_TABLET | Freq: Every day | ORAL | 10 refills | 30 days | Status: CP
Start: 2022-12-04 — End: ?

## 2022-12-04 NOTE — Unmapped (Signed)
Patient is requesting the following refill  Requested Prescriptions     Pending Prescriptions Disp Refills    JARDIANCE 10 mg tablet [Pharmacy Med Name: JARDIANCE 10 MG TABLET 10 Tablet] 30 tablet 10     Sig: TAKE 1 TABLET BY MOUTH DAILY       Recent Visits  Date Type Provider Dept   11/21/22 Office Visit Claudean Kinds, MD [facility]   09/26/22 Office Visit Criselda Peaches, Lidia Collum, MD [facility]   09/23/22 Office Visit [name], [name], [place] [facility] Acute Care Services   08/02/22 Office Visit Claudean Kinds, MD [facility]   06/03/22 Office Visit Passannante, Braulio Conte, [place] [facility] Acute Care Services   04/21/22 Office Visit Claudean Kinds, MD [facility]   03/27/22 Office Visit [name], [name], [place] [facility] Acute Care Services   03/06/22 Office Visit Claudean Kinds, MD [facility]   12/26/21 Office Visit Claudean Kinds, MD [facility] [place]   Showing recent visits within past 365 days and meeting all other requirements  Future Appointments  Date Type Provider Dept   08/06/23 Appointment Criselda Peaches Lidia Collum, MD [facility]   Showing future appointments within next 365 days and meeting all other requirements       Labs: A1c:   Hemoglobin A1C (%)   Date Value   11/21/2022 6.1 (H)   09/14/2021 6.5 (A)

## 2022-12-07 DIAGNOSIS — L400 Psoriasis vulgaris: Principal | ICD-10-CM

## 2022-12-07 MED ORDER — RISANKIZUMAB-RZAA 150 MG/ML SUBCUTANEOUS PEN INJECTOR
SUBCUTANEOUS | 0 refills | 84.00000 days | Status: CP
Start: 2022-12-07 — End: ?
  Filled 2022-12-21: qty 1, 84d supply, fill #0

## 2022-12-07 NOTE — Unmapped (Signed)
Patient overdue for annual follow up appointment. One bridge refill provided.

## 2022-12-07 NOTE — Unmapped (Signed)
Pharmacy request new Rx, medication pended

## 2022-12-07 NOTE — Unmapped (Signed)
Appointment on 09/05 at 1:30 pm with Dr. Bennie Hind in [facility],  [place]

## 2022-12-08 MED ORDER — HYDROCHLOROTHIAZIDE 12.5 MG TABLET
ORAL_TABLET | Freq: Every day | ORAL | 2 refills | 0 days
Start: 2022-12-08 — End: ?

## 2022-12-11 MED ORDER — HYDROCHLOROTHIAZIDE 12.5 MG TABLET
ORAL_TABLET | Freq: Every day | ORAL | 1 refills | 100 days | Status: CP
Start: 2022-12-11 — End: ?

## 2022-12-11 NOTE — Unmapped (Signed)
Patient is requesting the following refill  Requested Prescriptions     Pending Prescriptions Disp Refills    hydroCHLOROthiazide 12.5 MG tablet [Pharmacy Med Name: hydroCHLOROthiazide 12.5 MG Oral Tablet] 100 tablet 2     Sig: TAKE 1 TABLET BY MOUTH DAILY       Recent Visits  Date Type Provider Dept   11/21/22 Office Visit Claudean Kinds, MD [facility]   09/26/22 Office Visit Claudean Kinds, MD [facility]   09/23/22 Office Visit [name], [name], [place] [facility]   08/02/22 Office Visit Claudean Kinds, MD [facility]   06/03/22 Office Visit Passannante, Braulio Conte, [place] [facility]   04/21/22 Office Visit Claudean Kinds, MD [facility]   03/27/22 Office Visit [name], [name], [place] [facility]   03/06/22 Office Visit Claudean Kinds, MD [facility]   12/26/21 Office Visit Claudean Kinds, MD [facility] [place]   Showing recent visits within past 365 days and meeting all other requirements  Future Appointments  Date Type Provider Dept   08/06/23 Appointment Criselda Peaches, Lidia Collum, MD [facility]   Showing future appointments within next 365 days and meeting all other requirements       Labs: Creatinine:   Creatinine (mg/dL)   Date Value   57/84/6962 1.17    Potassium:   Potassium (mmol/L)   Date Value   11/21/2022 4.2    Sodium:   Sodium (mmol/L)   Date Value   11/21/2022 142

## 2022-12-13 ENCOUNTER — Ambulatory Visit: Admit: 2022-12-13 | Payer: MEDICARE

## 2022-12-13 ENCOUNTER — Ambulatory Visit: Admit: 2022-12-13 | Discharge: 2022-12-14 | Payer: MEDICARE

## 2022-12-13 ENCOUNTER — Ambulatory Visit: Admit: 2022-12-13 | Discharge: 2023-01-11 | Payer: MEDICARE

## 2022-12-13 DIAGNOSIS — G950 Syringomyelia and syringobulbia: Principal | ICD-10-CM

## 2022-12-13 NOTE — Unmapped (Signed)
[facility] [place]  OUTPATIENT PHYSICAL THERAPY  12/13/2022  Note Type: Evaluation       Patient Name: Paul Velez  Date of Birth:May 02, 1945  Diagnosis:   Encounter Diagnoses   Name Primary?    Back pain, unspecified back location, unspecified back pain laterality, unspecified chronicity Yes    Impaired functional mobility, balance, gait, and endurance      Referring MD:  Paul Velez, *     Date of Onset of Impairment-No date available  Date PT Care Plan Established or Reviewed-No date available  Date PT Treatment Started-No date available   Plan of Care Effective Date:          Assessment/Plan:    Assessment  Assessment details:      Paul Velez is a pleasant 77 y.o. male who presents for Physical Therapy Evaluation with chronic low back pain what radiates down his legs. Primary impairments include Decrease LE strength, functional mobility and impaired ambulation. 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  The patient will benefit from skilled Physical Therapy intervention to address the moderate impairments listed below and to assist the patient in maximizing his functional independence and safe return to prior level of function.             Impairments: decreased endurance, pain, decreased strength, decreased range of motion and impaired motor control      Personal Factors/Comorbidities: 3+    Specific Comorbidities: Cardiomyopathy (CMS-HCC) CHF (congestive heart failure), NYHA class II, chronic, systolic (CMS-HCC) Chronic obstructive pulmonary disease (CMS-HCC) Diabetes mellitus, type 2 (CMS-HCC) Diabetic peripheral neuropathy (CMS-HCC) Hypertension associated with diabetes (CMS-HCC    Examination of Body Systems: musculoskeletal, activity/participation and communication    Clinical Presentation: stable    Clinical Decision Making: moderate    Prognosis: good prognosis    Negative Prognosis Rationale: Pain Status, medical status/condition, chronicity of condition, severity of symptoms, endurance and strength.      Therapy Goals      Goals:        1. In 12 weeks the patient will demonstrate independent performance of HEP to maintain functional gains.   2. In 12 weeks the patient will demonstrate  tandem stance balance for 10 seconds to indicate progression towards return to PLOF.   3. In 12 weeks the patient will improve 5x sit to stand score to below 13.5 seconds to increase LE strength and reduce falls risk   4. In 12 weeks the patient will ambulate >80% of 2 MWT age/gender norm distance with the least restrictive assistance device to indicate increased activity tolerance.        Plan    Therapy options: will be seen for skilled physical therapy services    Planned therapy interventions: Balance Training, Education - Patient, Endurance Activites, Functional Mobility, Home Exercise Program, Education - Family/Caregiver, E-Stim, [employer], Civil engineer, contracting, Diaphragmatic/Pursed-lip Breathing, 97113-Aquatic Therapy, 97112-Neuromuscular Re-education, 97110-Therapeutic Exercises, 97116-Gait Training, 97140-Manual Therapy, 97530-Therapeutic Activities, 97535-Self-Care/Home Training, 97750-Physical Performance Test and 10626, 20561-Dry Needling 1-2, 3+ areas    DME Equipment: [employer].    Frequency: 1x week    Duration in weeks: 12    Education provided to: patient.    Education provided: HEP, Treatment options and plan, Symptom management, Safety education, Importance of Therapy, Anatomy, Body mechanics, Role of therapy in Rehabilitation, Posture, Community resources and [employer]  awareness    Education results: verbalized good understanding, demonstrates understanding and needs reinforcement.    Communication/Consultation: N/A.    Next visit plan:        - Gait speed test, 2 MWT, LE strengthening, MT on piriformis (B)             Subjective: History of Present Condition     Date of onset:  06/14/2022    Date is approximation?: yes    Explanation:  Sometime in February     History of Present Condition/Chief Complaint:  Low back pain and difficulty walking   Subjective:  - a rough days ago I walked to the car and my knee buckled and I went down to the ground   - I got up from my desk on day in February and I had pain   - I use the rollator in home, one upstairs and one in the car  - 2L Supp O2   - Sunday I was going out to get the dog and my back shot up to 10/10   - this sciatica pain makes me lazy   - I used to jog a lot   - I lost 60 lbs in the last few month- dieting   Pain:     Current pain rating:  6    At worst pain rating:  10  Location:  Sharp in butt, achying pain in legs    Quality:  Sharp and aching    Relieving factors:  Rest, lying and heat    Aggravating factors:  On the move    Precautions:  None    Physical limitation(s):  Unable to perfrom sexual actiivty  Current Functional Status:  Limited recreation, limited standing tolerance, limited walking tolerance, unsteady gait, limited bending and difficulty with transfers  Social Support:     Lives Environment:  Two story house, stairs and hand rails    Lives with:  Significant other    Work/School:  Retired  Diagnostic Tests:     MRI studies: abnormal      Comments:  T Spine   -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 Differential is broad and includes autoimmune conditions (possibly related to sarcoid), infarct(unlikely in light of no acute symptoms), syrinx (also thought less likely) or an underlying lesion which will be better assessed with contrast. Recommend T1 MRI images with and without contrast for further evaluation.    --Multilevel degenerative disc disease and facet arthropathy which contribute to mild neural foraminal narrowing from T10-T12.    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 There is multilevel neuroforaminal narrowing, severe on the right at C5-C6.    --No cord signal abnormality.    L- Spine   1. Severe neuroforaminal narrowing at L3-L4 bilaterally, at L4-L5 on the right, and L L5-S1 bilaterally.  2. Moderate spinal canal narrowing at the L3-L4 level.        Treatments:     Current treatment: physical therapy    Patient Goals:     Patient/Family goals for therapy:  Decreased pain, return to sport/leisure activities, return to recreational activites, increased strength, improved standing tolerance, improved balance and improved ambulation    Other patient goals:  Make the pain go away, get back to fishing and bowling      Objective:   Objective      Posture/Observation:   Sitting: unremarkable  Standing: increased hip flexion  Gait: antalgic gait favoring bilaterally   and with a Wide BOS     Lumbar Spine ROM  Motion No ROM Loss  (0%) Min ROM Loss  (1-33%) Mod ROM Loss  (34-65%) Major ROM Loss  (66-100%) Symptoms    Flexion x       Extension   x     Sideglide Right  X- Pain shooting down lwg       Sideglide Left  x        Special tests:   Right Left   Slump - -   SLR + +   SIJ cluster: - -       NEUROLOGICAL:  MOTOR STRENGTH  Muscle Right Left   Hip Flexion 3+/5 4+/5   Knee extension 3+/5 4+/5   Knee flexion  4/5 4/5- pain in VMO    Ankle DF 4+/5 4+/5   Ankle PF 4+/5 4+/5     ROM   - grossly WFL   - Limited IR in hips bilaterally- roughyl 5 degrees     SENSATION - LIGHT TOUCH  WFL     Standing tolerance-  30 seconds- limited to pain       Palpation: TTP: L VMO, L lateral gastroc, L Piriformis attachment and insertion, R piriforms      Short Physical Performance Battery (SPPB) Initial Eval Date 12/13/22  Reassessment    Balance Tests Feet Together 10 sec     Semi-Tandem 10 sec     Tandem 0 sec     Score =  1    Gait Speed Test 4 meters normal pace (best of 2 trials) Assess next visit  sec     Score = NV    Chair Stand Test 5 x STS arms over chest 16.2 sec     Score =  2    Total Score NV/12      Scoring Rules: (MCID = 0.3-0.8 points; Substantial change = 0.4-1.5 points)  Balance Tests:  FT < 10.0 sec = 0   FT > 10.0 sec, Semi-tandem < 10.0 sec = 1  FT & ST > 10 sec, Tandem < 3.0 sec = 2   FT & ST > 10 sec, Tandem 3.0-9.99 sec = 3  FT & ST > 10 sec, Tandem >10.0 = 4     Gait Speed Test (50m Walk Test) - best of 2 trials, time required to walk 4 meters at normal pace:  Unable = 0  > 8.7 sec = 1 (0.459 m/s or slower)  6.21-8.70 sec = 2 (0.46 m/s to 0.644 m/s)  4.82-6.20 sec = 3 (0.644 m/s to 0.83 m/s)   < 4.82 sec = 4 (0.829 m/s or faster)    Chair Stand Test (5xSTS):  > 60 sec or unable to complete with arms folded across chest = 0  > 16.7 sec = 1  13.70-16.69 sec = 2  11.20-13.69 sec = 3  </= 11.19 sec = 4      References:  Tobie Lords, Perera S, Pahor M, et al. What is a meaningful change in physical erformance? Findings from a clinical trial in older adults (the LIFE-P study). J Nutr Health Aging. 2009;13(6):538-544. doi:10.1007/s12603-236-118-2716-z     Pavasini R, Raynelle Highland, et al. Short Physical Performance Battery and all-cause mortality: systematic review and meta-analysis. [facility] Med. 2016;14(1):215. Published 2016 Dec 22. doi:10.1186/s12916-256-053-2795-7       TREATMENT RENDERED   Mod Evaluation- 30 min   Therapeutic Exercise:  15  Minutes      Supine Bridge  - 2 x daily - 2 sets - 10 reps  - Mini Squat with Counter Support  - 2 x daily - 2 sets - 10 reps  - Supine Hip Internal Rotation  - 2 x daily - 2 sets - 10 reps  - Seated Piriformis Stretch  - 2 x daily - 2 sets - 30 hold         Next Visit Plan:     - Gait speed test, 2 MWT, LE strengthening, MT on piriformis (B)       HEP  Access Code: [email or web address]  URL: [email or web address]  Date: 12/13/2022  Prepared by: Timmothy Euler    Exercises  - Supine Bridge  - 2 x daily - 2 sets - 10 reps  - Mini Squat with Counter Support  - 2 x daily - 2 sets - 10 reps  - Supine Hip Internal Rotation  - 2 x daily - 2 sets - 10 reps  - Seated Piriformis Stretch  - 2 x daily - 2 sets - 30 hold    .                      I attest that I have reviewed the above information.  Signed: Timmothy Euler, PT  12/13/2022 8:42 AM

## 2022-12-13 NOTE — Unmapped (Signed)
[facility] Refill Coordination Note    Specialty Medication(s) to be Shipped:   Inflammatory Disorders: Skyrizi    Other medication(s) to be shipped: No additional medications requested for fill at this time     Paul Velez, DOB: 10-27-1945  Phone: [phone] (home)       All above HIPAA information was verified with Paul Velez.     Was a Nurse, learning disability used for this call? No    Completed refill call assessment today to schedule Paul Velez's medication shipment from the [facility] [phone]).  All relevant notes have been reviewed.     Specialty medication(s) and dose(s) confirmed: Regimen is correct and unchanged.   Changes to medications: Paul Velez reports no changes at this time.  Changes to insurance: No  New side effects reported not previously addressed with a pharmacist or physician: None reported  Questions for the pharmacist: No    Confirmed Paul Velez received a Conservation officer, historic buildings and a Surveyor, mining with first shipment. The Paul Velez will receive a drug information handout for each medication shipped and additional FDA Medication Guides as required.       DISEASE/MEDICATION-SPECIFIC INFORMATION        For patients on injectable medications: Paul Velez currently has 0 doses left.  Next injection is scheduled for 12/26/2022.    SPECIALTY MEDICATION ADHERENCE     Medication Adherence    Paul Velez reported X missed doses in the last month: 0  Specialty Medication: risankizumab-rzaa (SKYRIZI) 150 mg/mL PnIj  Paul Velez is on additional specialty medications: No  Informant: Paul Velez              Were doses missed due to medication being on hold? No    Skyrizi 150 mg/ml: 0 days of medicine on hand       REFERRAL TO PHARMACIST     Referral to the pharmacist: Not needed      [facility]     Shipping address confirmed in Epic.     Delivery Scheduled: Yes, Expected medication delivery date: 12/21/2022.     Medication will be delivered via Same Day Courier to the prescription address in Epic WAM.    Paul Velez   [facility] Specialty Technician

## 2022-12-13 NOTE — Unmapped (Signed)
ORTHOPAEDIC SPINE CLINIC NOTE       Rolland Bimler, PA-C  Physician Assistant  [email or web address]  [phone]        Patient Name:Paul Velez  MRN: [id number]  DOB: [birth year]    Date: 12/13/2022    PCP: Claudean Kinds, MD    ASSESSMENT:   Paul Velez  77 y.o. male  [phone]  COPD. CHF. DM. HTN.     Lumbar Stenosis w/ [place] & Bilat Radic  MR-L 08/2022: Severe foraminal stenosis at L3-4 and L5-S1 bilat, R L4-5. 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      PLAN and RECOMMENDATIONS:     Options reviewed with patient.  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  To better identify this lesion we will obtain an MRI of his thoracic spine with and without contrast.  He will then follow-up with our neurosurgery colleagues after that MRI for further evaluation and determination of next steps.      Patient Instructions   Medications: Take over-the-counter medications as needed and as tolerated  Activity: Activities as tolerated.  Conservative Care: Let's continue to monitor your symptoms.  Surgical Care:  Follow-up with neurosurgery  Imaging studies: MRI of the Thoracic spine was ordered. Medical necessity: Radiographs have been performed/ordered. Presence of red flag findings as noted on our evaluation.  Labs: None  Return for Next scheduled follow up after advanced imaging.     Contact our nursing team via MyChart or telephone with any clinical questions/concerns.  Our scheduling team and support staff can be reached at [phone].     Orders Placed This Encounter   Procedures    MRI Thoracic Spine W Wo Contrast    Ambulatory referral to Neurosurgery          SUBJECTIVE:   Established Patient Interval History from JL (12/13/2022):  Here for MRI FU. Reports continued bilateral hand weakness and difficulty walking/with his balance.    History of Present Illness:   Paul Velez is a 77 y.o. male with relevant PMH of CHF, COPD, DM, HTN seen in consultation at the request of Claudean Kinds* for evaluation of low back pain, difficulty walking .   Date of Onset: 4 months    Patient presents today with acute on chronic low back pain that is gradually worsened over the past 4 months without any inciting event or injury.  Has recently started using a rolling walker to help with stability and finds that leaning forward on it helps him walk and relieve some of his symptoms.  He endorses numbness and tingling down both legs, right greater than left as well as his right leg giving out on him when he is trying to walk.  Positive rest relief.    Last 3-4 months balance has been worsening and has fell about 7 times.     Had Lumbar ESI x 3 with no relief. Feels that PT made his symptoms worse.    Denies any bowel/bladder incontinence.    Functional Limitations Include: Difficulty walking    Current Treatments Previous Treatments   Current Relevant Pain Medications:      Current Physical Therapy: No recent PT    Adjunct Treatments: Activity Modification    In a Pain Clinic: No Prior Relevant Pain Medications:  Prior Physical Therapy: None.    Injections:Yes.  Lumbar ESI    Prior Relevant Surgeries: None.        Medical History   He  has a past medical history of Allergic, CHF (congestive heart failure) (CMS-HCC), COPD (chronic obstructive pulmonary disease) (CMS-HCC), Diabetes type 2, controlled (CMS-HCC), HLD (hyperlipidemia), HTN (hypertension), Neuropathy, OA (osteoarthritis) of knee, PAF (paroxysmal atrial fibrillation) (CMS-HCC), and Sarcoidosis.     Surgical History   He  has no past surgical history on file.     Allergies   Rosuvastatin, Ace inhibitors, Lisinopril, and Brinzolamide-brimonidine   Medications   He has a current medication list which includes the following prescription(s): albuterol sulfate, amlodipine, atorvastatin, celecoxib, duloxetine, edoxaban, empty container, furosemide, gabapentin, hydrochlorothiazide, jardiance, losartan, metformin, onetouch verio test strips, risankizumab-rzaa, rocklatan, ozempic, sotalol, tizanidine, trelegy ellipta, triamcinolone, umeclidinium, and hydrocortisone.   Review of Systems A 10-system review was performed by questionnaire and noted in the electronic chart.  Positives noted/discussed.  Balance of systems was negative.    Fever/chills: denies  Recent unexplained weight loss: denies  Bowel/bladder symptoms: denies   Family History His family history is not on file.     Social History He  reports that he quit smoking about 53 years ago. His smoking use included cigarettes. He has never used smokeless tobacco. He reports that he does not currently use alcohol.        Occupational History    Not on file        OBJECTIVE:     PHYSICAL EXAM:  Vitals: Temp 36.7 ??C (98.1 ??F)   Appearance: well-nourished and no acute distress   Skin: No cyanosis or clubbing in bilat hands. and No edema in the feet.  Pulses: 2+ DP pulses bilaterally     Neurologic exam:   Mental Status: Paul Velez is oriented to time, place, and person & is alert and cooperative  Motor: Normal bulk and tone  Gait: slow, using walker .  Unable to perform toe and heel gait.    Motor R L  Reflexes R L   IP 4 4  Patellar 1-2+ 1-2+   Quad 4 4  Achilles 1-2+ 1-2+       Pathologic R L   TA 5 5  Hoffmann's neg. sublty pos.   EHL 4 4  Clonus neg. neg.   GS 5 5  Babinski neg. neg.   UE: 5/5 deltoid, 5/5 bicep, 4/5 tricep , 5/5 WE , 4/5 grip and IO bilaterally  Sensory: Sensation to light touch intact throughout.    Thoracolumbar Spine Exam:  Inspection: Normal skin. No swelling, erythema, deformity, atrophy or hypertrophy noted  Lumbar Spine ROM: severely restricted  Spine Palpation: diffusely tender to palpation  Nerve Tension Signs: Slump test positive right  and left    Sacroiliac Joint:   Inspection: Normal alignment. No erythema, discoloration, or asymmetry.  Palpation: No tenderness at PSIS    Right Hip/Knee:   No pain with hip internal rotation/loading. Normal internal rotation ROM.   No knee pain with flexion/extension. No atrophy noted upon inspection. Normal skin. Normal ROM and stability.  Left Hip/Knee:   No pain with hip internal rotation/loading. Normal internal rotation ROM.   No knee pain with flexion/extension. No atrophy noted upon inspection. Normal skin. Normal ROM and stability.       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    I, Rolland Bimler, PA-C, personally interpreted the images. Images were reviewed with the patient on the PACS monitor. The available reports were reviewed.    Labs:  Lab Results   Component Value Date    A1C 6.1 (H) 11/21/2022       Discussion:  Clinical findings, diagnostic/treatment options, and plan were discussed with the patient.  Activities - Advised activities as tolerated, using pain as a guide.  Activities - Encouraged walking  Conservative Care - Options discussed.  Conservative Care - Warning symptoms were discussed.  Imaging - Discussed pros/cons of advanced imaging options.      E&M Coding:    MEDICAL DECISION MAKING (level of service defined by 2/3 elements)     Number/Complexity of Problems Addressed 1 or more chronic illnesses with severe exacerbation, progression, or side effects of treatment (99205/99215)   Amount/Complexity of Data to be Reviewed/Analyzed Independent interpretation of a test performed by another physician/other qualified health care professional (99204/99214)   Risk of Complications/Morbidity/Mortality of Management --LOW Risk of Morbidity from Additional Diagnostic Testing or Treatment (99203/99213)--     Or TIME     Total Time for E/M Services on the Date of Encounter N/A          cc: Pennelope Bracken, MD

## 2022-12-13 NOTE — Unmapped (Addendum)
Medications: Take over-the-counter medications as needed and as tolerated  Activity: Activities as tolerated.  Conservative Care: Let's continue to monitor your symptoms.  Surgical Care:  Follow-up with neurosurgery  Imaging studies: MRI of the Thoracic spine was ordered. Medical necessity: Radiographs have been performed/ordered. Presence of red flag findings as noted on our evaluation.  Labs: None  Return for Next scheduled follow up after advanced imaging.     Contact our nursing team via MyChart or telephone with any clinical questions/concerns.  Our scheduling team and support staff can be reached at [phone].

## 2023-01-03 NOTE — Unmapped (Incomplete)
For your psoriasis:  - Continue COSENTYX 300mg  under the skin every 28 days  - Apply triamcinolone 0.1% cream twice daily to your legs until skin is clear or for no longer than two weeks  - Apply hydrocortisone 2.5% cream twice daily behind your ears until skin is clear or for no longer than two weeks.    Basic Skin Care  Your skin plays an important role in keeping the entire body healthy.  Below are some tips on how to try and maximize skin health from the outside in.    Bathe in mildly warm water every 1 to 2 days, followed by light drying and an application of a thick moisturizer cream or ointment, preferably one that comes in a tub.  Fragrance free moisturizing bars or body washes are preferred such as Purpose, Cetaphil, Dove sensitive skin, Aveeno, or Vanicream products.  Use a fragrance free cream or ointment, not a lotion, such as plain petroleum jelly or Vaseline ointment, Aquaphor, Vanicream, Eucerin cream or a generic version, CeraVe Cream, Cetaphil Restoraderm, Aveeno Eczema Therapy and [employer], among others.  People with very dry skin often need to put on these creams two, three or four times a day.  As much as possible, use these creams enough to keep the skin from looking dry.  Consider using fragrance free/dye free detergent, such as Arm and Hammer for sensitive skin, Tide Free or All Free.     If I am prescribing a medication to go on the skin, the medicine goes on first to the areas that need it, followed by a thick cream as above to the entire body.    Wynelle Link is a major cause of damage to the skin.  I recommend sun protection for all of my patients. I prefer physical barriers such as hats with wide brims that cover the ears, long sleeve clothing with SPF protection including rash guards for swimming. These can be found seasonally at outdoor clothing companies, [employer] and [employer] and online at Liz [email or web address].com, [email or web address] and BrideEmporium.nl. Avoid peak sun between the hours of 10am to 3pm to minimize sun exposure.    I recommend sunscreen for all of my patients older than 77 months of age when in the sun, preferably with broad spectrum coverage and SPF 30 or higher.   For sensitive skin: I recommend sunscreens that only contain titanium dioxide and/or zinc oxide in the active ingredients. These do not burn the eyes and appear to be safer than chemical sunscreens. Products includ Vanicream Broad Spectrum 50+, Aveeno Natural Mineral Protection, Neutrogena Pure and Free Baby, Johnson and [employer] Daily face and body lotion, and EltaMD.  There is no such thing as waterproof sunscreen. All sunscreens should be reapplied after 60-80 minutes of wear.   Iii. For those that prefer not to use cream-based sunscreen, there are alternatives such as SPRAYS and STICKS.  These are also good options if you are sweaty or working out.  Spray on sunscreens often use chemical sunscreens which do protect against the sun. However, these can be difficult to apply correctly, especially if wind is present, and can be more likely to irritate the skin.  I recommend applying it at least twice over the involved areas.  For the stick, I recommend Neutrogena.  It looks like a deodorant stick.  It must be applied 4 times over the same area to protect as much as the cream.      I also recommend discussing Vitamin D supplementation with your  primary care doctor as patients typically do not get enough Vitamin D through the skin in our area, even without using sunscreen.

## 2023-01-03 NOTE — Unmapped (Unsigned)
Dermatology Note     Assessment and Plan:      Benign Lesions/ Findings:   {derm skin check benign A&P:75424}  - Reassurance provided regarding the benign appearance of lesions noted on exam today; no treatment is indicated in the absence of symptoms/changes.  - Reinforced importance of photoprotective strategies including liberal and frequent sunscreen use of a broad-spectrum SPF 30 or greater, use of protective clothing, and sun avoidance for prevention of cutaneous malignancy and photoaging.  Counseled patient on the importance of regular self-skin monitoring as well as routine clinical skin examinations as scheduled.     Plaque psoriasis- chronic, flaring   - Less than 5% BSA involved  - Previously failed high potency topical steroids  - Treatment options were discussed with the patient. Patient has no history of IBD.   - Continue *** secukinumab (COSENTYX PEN, 2 PENS,) 150 mg/mL PnIj injection; Inject 2 mL (300 mg total) under the skin every twenty-eight (28) days.  - Continue ***  triamcinolone (KENALOG) 0.1 % cream; Apply topically Two (2) times a day. For your legs.  - Continue *** hydrocortisone 2.5 % cream; Apply topically Two (2) times a day. Apply behind your ears.    There are no diagnoses linked to this encounter.    The patient was advised to call for an appointment should any new, changing, or symptomatic lesions develop.     RTC: No follow-ups on file. or sooner as needed   _________________________________________________________________      Chief Complaint     No chief complaint on file.      HPI     Paul Velez is a 77 y.o. male who presents as a returning patient (last seen by Dr. Bennie Hind on 12/15/2021) to Dermatology for follow up of plaque psoriasis. At last visit, patient was to continue Cosentyx pen 300mg /mL under the skin every 28 days, start triamcinolone 0.1% cream, and start hydrocortisone 2.5% cream for plaque psoriasis.     Plaque psoriasis  - ***    The patient denies any other new or changing lesions or areas of concern.     Pertinent Past Medical History     No history of skin cancer  Psoriasis  T2DM  Diabetic neuropathy  CHF  COPD  HTN    Family History:   Negative for melanoma    Past Medical History, Family History, Social History, Medication List, Allergies, and Problem List were reviewed in the rooming section of Epic.     ROS: Other than symptoms mentioned in the HPI, no fevers, chills, or other skin complaints    Physical Examination     GENERAL: Well-appearing male in no acute distress, resting comfortably.  NEURO: Alert and oriented, answers questions appropriately  PSYCH: Normal mood and affect  {PE extent:75514}  {PE list:75421}  ***    All areas not commented on are within normal limits or unremarkable    Scribe's Attestation: Cruzita Lederer, MD obtained and performed the history, physical exam and medical decision making elements that were entered into the chart. Signed by Romona Curls, Scribe, on ***.    {*** NOTE TO PROVIDER: PLEASE ADD ATTESTATION NOTING YOU AGREE WITH SCRIBE DOCUMENTATION}    (Approved Template 01/12/2020)

## 2023-01-05 ENCOUNTER — Ambulatory Visit: Admit: 2023-01-05 | Discharge: 2023-01-06 | Payer: MEDICARE

## 2023-01-05 MED ADMIN — gadopiclenol injection 10 mL: 10 mL | INTRAVENOUS | @ 15:00:00 | Stop: 2023-01-05

## 2023-01-09 ENCOUNTER — Ambulatory Visit: Admit: 2023-01-09 | Discharge: 2023-01-09 | Payer: MEDICARE

## 2023-01-09 DIAGNOSIS — E1159 Type 2 diabetes mellitus with other circulatory complications: Principal | ICD-10-CM

## 2023-01-09 DIAGNOSIS — E785 Hyperlipidemia, unspecified: Principal | ICD-10-CM

## 2023-01-09 DIAGNOSIS — G950 Syringomyelia and syringobulbia: Principal | ICD-10-CM

## 2023-01-09 DIAGNOSIS — I152 Hypertension secondary to endocrine disorders: Principal | ICD-10-CM

## 2023-01-09 DIAGNOSIS — E1169 Type 2 diabetes mellitus with other specified complication: Principal | ICD-10-CM

## 2023-01-09 MED ORDER — TRAMADOL 50 MG TABLET
ORAL_TABLET | Freq: Two times a day (BID) | ORAL | 0 refills | 10 days | Status: CP | PRN
Start: 2023-01-09 — End: 2024-01-09

## 2023-01-09 MED ORDER — GABAPENTIN 600 MG TABLET
ORAL_TABLET | Freq: Three times a day (TID) | ORAL | 3 refills | 90 days | Status: CP
Start: 2023-01-09 — End: 2024-01-09

## 2023-01-09 NOTE — Unmapped (Signed)
 Follow-up as directed.    Call, MyChart message, or return sooner if you have any problems.  MyChart messaging is the preferred method of communication with our clinic, however if you prefer to call, our phone number is [phone].   This is our direct clinic line and is answered by our clinic staff. If you do not get through, leave a message and our staff will return your call.    If EMG has been ordered, the number to schedule is [phone].  If imaging (x-rays, MRI, CT) has been ordered, the number to schedule is [phone]  For other questions regarding our neurosurgery administration side, the phone number is [phone]    Paul Velez's normal clinic days are Mondays from 1pm-4pm and Tuesdays-Fridays from 9am to 4pm.  Visit our web site for further information, news items and provider biographies:  [email or web address]    Over the counter Anti-Inflammatory Options  Below is a list of over the counter anti-inflammatories to help with your joint pain. Check with your primary care provider as well to determine if you should avoid any of these medications and/or supplements due to health concerns or other medications you may be taking.    The following should be taken for every day for two weeks to determine efficacy:    Ibuprofen - 600-800mg  every 8 hours    OR  Aleve - 500mg  every 12 hours    The following are not FDA regulated, and are supplements. Different brands of these medications may have different directions, so take according the label's recommendations and check with your primary care provider to ensure they do not interfere with your current medications or health concerns.    Over the counter supplements:    Nervive Nerve Relief  Glucosamine  Fish Oil  Tumeric Supplements  Cinnamon Supplements  CBD Oil either in capsule or topical application    This is not considered a comprehensive list and each of the supplements and medications should be discussed with our providers and your own primary care provider to determine safety.    If you were recommended to go to physical therapy, the below are some of the [facility] PT locations in the surrounding areas:    [facility] PHYSICAL THERAPY LOCATIONS   Location Phone # [facility] Financial Assistance Address Comments:   EASTERN [place]   [place] [facility])   [facility]  Physical Therapy [phone] YES [address]  [place], [place] [address] OT/PT/Speech   [facility] [phone] YES [address]  [place], [place] [address] OT/PT/Speech   Lu [facility])   [facility]  (An affiliate of [place]) [phone] No* [address] [place], [place] [address] [facility] has their own FA program   [place])   [facility] [phone] YES [address]  [place], [place] [address] OT/PT/Speech   [facility])    [facility] Complete [facility]) [phone] YES* [address]  [place], [place] [address] OT/PT   [place] [facility])   [facility]  (Outpatient Rehabilitation) [phone] YES [address]  [place], [place] [address] OT/PT/Speech   [facility])   [facility] Health Rehab [phone] YES [address] [facility], [address]  [place], [place] [address] OT/PT/Speech   [place]   [place] [facility])   [place] [facility] [phone] YES [address]  [place], [place] [address] OT/PT/Speech   [place]  [place])   [employer]  (PT&OT @ [place]) [phone] YES [address]  [place], [place] [address] PT/OT   [facility] for Rehabilitation Care [phone] YES [address]  [place], [place] [address] PT/OT   [place] [facility])    [facility]  Therapy [phone] YES [address]  [place], [place] [address] OT/PT/Speech   [facility])   [facility] Services  at [employer] [phone] YES [address] [place], [place] [address] PT   [facility])   [facility] [phone] YES [address]  [place], [place] [address] OT/PT/Speech   [facility]  [place] [address] YES [address]  [place], [place] [address] Non-neuro OT/PT  Speech   [place] [facility])   [facility] Special[facility] Care at  [facility] [phone] YES [address]  [place], [place] [address] PT/OT/Speech   WESTERN [place]   [place])    [facility] Physical Therapy [phone] YES [address]  [place], [place] [address] PT   [place] [facility])    [facility] Physical Therapy [phone] YES [address]  [place], [place] [address] PT/OT   [place] [facility])    [facility] Physical Therapy [phone] YES [address]  [place], [place] [address] Neuro PT/OT & Pool Therapy   [place] [facility]   [facility] Services [phone] YES [address]  [place], [place] [address] PT/OT/Speech   [facility])   [place] [facility] [phone] No* [address]  [place], [place] [address] *Has their own FA program through [facility]   [place] [facility])   [facility] [phone] No* [address]  [place], [place] [address] *Has their own FA program through [facility]   [facility] FOREST [facility])   [name] Physical Therapy [phone] YES [address]  [place], [place] [address] PT   [place])   [place] [facility] [phone] No* [address]  [place], [place] [address] *Has their own FA program through [facility]       Alternative PT options outside of [facility] that have dry needling and other alternative modalities  Dry Needling Cash Pay RDU Market   Company Website Address Contact Cash Prices Packages   [facility] [email or web address] [address] [place], [place] [address] P: [phone]  F: [phone]  [email or web address] -30 minute dry needling session: $83  -60 minute dry needling session: $124    [facility] [email or web address] Several in [place], [facility], [place], [place], and [place] P: [phone] - : Initial $95 and $85 follow-ups  - : Initial $185 and $160 follow-ups      [facility] [email or web address] [address], [place], [place] [address] P: [phone]  P: [phone]  Email: [email or web address] -Initial session: $65  -Follow-ups $50/session 5 sessions for $225   [facility] [email or web address] [address]   [place], [place] [address] P: [phone]  F: [phone] -$90 Cash Rate    [facility] [email or web address]   [address]  [place], [place] [address] Abundio Miu, PT, DPT, OCS  Owner/physical therapist  [email or web address]  [phone] -Eval plus dry needling $225  - $112.50     -6 visits: $1,230 ($205/session)  -10 visits: $1,900 ($190/session)  -15 visits: $2,625 ($175/session)   [facility]  Personal Training and Physical Therapy [email or web address]   [address] [place], [place] [address]  [place], [place] [address] P: [phone]  F: [phone] -$140 - 60 per session  -$120 - follow-ups     [facility] Physical Therapy in [place], [place] [facility] ([email or web address] [address]  [place], [place] [address] P: [phone]  F: [phone] -$100 per session    AVERAGE : $125.90  AVERAGE : $93.75

## 2023-01-09 NOTE — Unmapped (Signed)
//   Hyperlipidemia:    - On atorvastatin 40mg   - Education regarding hyperlipidemia was given. Questions answered.  -   Lab Results   Component Value Date    LDL Calculated 59 11/21/2022    HDL 46 11/21/2022    Triglycerides 103 11/21/2022     - The 10-year ASCVD risk score (Arnett DK, et al., 2019) is: 31.6%

## 2023-01-09 NOTE — Unmapped (Signed)
//   Hypertension: BP at goal?: at goal below 130/80   BP Readings from Last 3 Encounters:   11/21/22 98/70   09/26/22 122/77   09/23/22 100/78     - Last Labs:    Lab Results   Component Value Date    CREATININE 1.17 11/21/2022    K 4.2 11/21/2022    NA 142 11/21/2022    CO2 31.3 (H) 11/21/2022     - The patient's calculated ASCVD 10 Year Risk Score is 31.6%.   - Treated with: amlodipine (Norvasc) 2.5mg  and losartan (Cozaar) 25mg   and hydrochlorothiazide 25mg . Consider stopping hydrochlorothiazide and continue lasix for fluid control if hypotensive any more. Follow up with cardiology for repeat echo and sotalol decrease to prevent pre-syncope  - Instructed to measure home BP weekly and record; instructional handout given.  - Control dietary sodium and daily exercise.  - Follow up: 3 months

## 2023-01-09 NOTE — Unmapped (Addendum)
//   Diabetes Type 2: controlled  - Recent Labs:   Lab Results   Component Value Date    Hemoglobin A1C 6.1 (H) 11/21/2022    Hemoglobin A1C 5.6 08/31/2022    Hemoglobin A1C 7.2 (H) 04/21/2022    Hemoglobin A1C 6.5 (A) 09/14/2021    Hemoglobin A1C 6.4 (A) 06/24/2021    LDL Calculated 59 11/21/2022      - The 10-year ASCVD risk score (Arnett DK, et al., 2019) is: 31.6% on ASA/Statin Prevention Therapy No.  - Treated with: Semaglutide, jardiance and Metformin (Glucophage) 1000mg  BID  - Plan Discussed general issues about diabetes pathophysiology and management.

## 2023-01-09 NOTE — Unmapped (Signed)
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   - Pain control with gabapentin, Cymbalta, Tylneol and injections; maximize gabapentin to 1200mg  TID. Trial of tramadol PRN for break through pain.     NSG to consider Surgery.

## 2023-01-09 NOTE — Unmapped (Signed)
Neurosurgery Clinic Note    Patient Name   [facility]  [address]  [place] [address]  _____________________________________________________    History of Present Illness  01/09/2023  Patient is a 77 y.o. male who is seen in consultation at the request of Charlyne Quale, * for an evaluation of low back with radiating leg pain, worse on his right leg. Patient describes pain as achy and constant in his low back with sharp pain that shoots down to both his ankles, worse on the right.  Patient has had several epidural steroid injections per his report with waning benefit and has performed physical therapy previously with no improvement, is currently on gabapentin and tramadol and celebrex for this pain with no improvement.      Past Medical History:   Diagnosis Date    Allergic     CHF (congestive heart failure) (CMS-HCC)     COPD (chronic obstructive pulmonary disease) (CMS-HCC)     Diabetes type 2, controlled (CMS-HCC)     HLD (hyperlipidemia)     HTN (hypertension)     Neuropathy     OA (osteoarthritis) of knee     PAF (paroxysmal atrial fibrillation) (CMS-HCC)     Sarcoidosis      No past surgical history on file.  Patient Active Problem List   Diagnosis    Cardiomyopathy (CMS-HCC)    CHF (congestive heart failure), NYHA class II, chronic, systolic (CMS-HCC)    Chronic obstructive pulmonary disease (CMS-HCC)    Diabetes mellitus, type 2 (CMS-HCC)    Diabetic peripheral neuropathy (CMS-HCC)    Hypertension associated with diabetes (CMS-HCC)    ED (erectile dysfunction) of organic origin    Emphysema of lung (CMS-HCC)    Hyperlipidemia associated with type 2 diabetes mellitus (CMS-HCC)    Lung nodule    OSA (obstructive sleep apnea)    PAF (paroxysmal atrial fibrillation) (CMS-HCC)    Routine health maintenance    Anemia in other chronic diseases classified elsewhere    Benign prostatic hyperplasia with nocturia    OA (osteoarthritis) of knee    Sciatic leg pain    Mood disorder (CMS-HCC)    Stage 3a chronic kidney disease (CMS-HCC)    Orthostatic hypotension    Iron deficiency anemia secondary to inadequate dietary iron intake    Syrinx of spinal cord (CMS-HCC)       Past medical and surgical history plus medication and allergies reviewed in [email or web address]     Social History  Accompanied by spouse    Physical Exam    Vital Signs: Stable today, reviewed in [email or web address]     General Appearance:  NAD. Sitting comfortably in exam room.    The patient was awake, alert and oriented ??3.  Naming, repetition and fund of knowledge were intact.  The patient was a good historian for recent and remote events surrounding their illness. Followed commands easily.    Cranial nerves: PERRLA. Extraocular movements were full, no nystagmus. Peripheral fields intact to confrontation. Facial sensation was intact, facial movement was symmetric, phonation was normal, tongue was midline, shoulder shrug symmetric.    CV: Pink and well-perfused, brisk capillary fill no edema  RESP: Breathing is even and nonlabored, acyanotic  GI: Abdomen is soft nontender, nondistended  SKIN: Intact with no evidence of rashes or lesions    Hoffman's: Negative  Ankle Clonus: Negative    Gait: Antalgic, slow velocity  Rapid arm movement: weak  Piano movement with fingers: weak  Heel stepping: not done due to  pain  Toe stepping: not done due to pain    Deep tendon reflexes: 2+ and symmetric      Upper Extremity Motor Right Left   Scapular stabilization (C4) 5/5 5/5   Deltoid:  (C5) 5/5 5/5   Biceps (C5-6) 5/5 5/5   Brachioradialis (C6) 5/5 5/5   Triceps (C7): 5/5 5/5   Hand grip (C8) 5/5 5/5   Finger abduction (T1) 5/5 5/5     Lower Extremity Motor Right Left   Hip flexion (L2) 5/5 5/5   Knee extension:  (L3) 5-/5 5-/5   Ankle Dorsiflexion (L4) 4/5 with 'giveway' weakness 4/5 with 'giveway' weakness   EHL (L5) 5/5 5/5   Plantar flexion (S1) 5/5 5/5   FHL (S2) 5/5 5/5     Upper Extremity Sensory Right Left   Upper Shoulder (C4) 2/2 2/2   Deltoid, lateral arm:  (C5) 2/2 2/2   Thumb, lateral forearm (C6) 2/2 2/2   Fingers 2,3,4 (C7) 2/2 2/2   Finger 5 (C8) 2/2 2/2   Medial Elbow (T1) 2/2 2/2     Lower Extremity Sensory Right Left   Iliac Crest (L1) 2/2 2/2   Inner thigh:  (L2) 2/2 2/2   Medial knee  (L3) 2/2 2/2   Medial leg (L4) 2/2 2/2   Lateral leg, top of foot (L5) 1/2 2/2   Posterior leg (S1) 2/2 2/2   Plantar foot (S2) 1/2 1/2     Test Results  MRI Thoracic with and without contrast 12/13/22  Similar cord signal abnormality at T6, without postcontrast enhancement. Differential remains broad and includes autoimmune conditions, infarct or evolving syrinx. Imaging findings do not favor neoplasm.     MRI Thoracic Spine Wo Contrast  Status: Edited Result - FINAL     Addendum    Signed by Judeth Porch, DO on 11/24/22 1508    Also within the differential is anterior cord herniation.     Impression   --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 Differential is broad and includes autoimmune conditions (possibly related to sarcoid), infarct(unlikely in light of no acute symptoms), syrinx (also thought less likely) or an underlying lesion which will be better assessed with contrast. Recommend T1 MRI images with and without contrast for further evaluation.      --Multilevel degenerative disc disease and facet arthropathy which contribute to mild neural foraminal narrowing from T10-T12.     Narrative   EXAM: Magnetic resonance imaging, spinal canal and contents, thoracic, without contrast material.   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 There is multilevel neuroforaminal narrowing, severe on the right at C5-C6.      --No cord signal abnormality.        Narrative   EXAM: Magnetic resonance imaging, spinal canal and contents, cervical without contrast material.   DATE: 11/06/2022 6:08 PM     MRI lumbar spine without contrast  Status: Final result     Impression   Multilevel degenerative changes throughout the lumbar spine, most notably producing:   1. Severe neuroforaminal narrowing at L3-L4 bilaterally, at L4-L5 on the right, and L L5-S1 bilaterally.   2. Moderate spinal canal narrowing at the L3-L4 level.        Narrative   EXAM: Magnetic resonance imaging, spinal canal and contents, lumbar, without contrast  material.   DATE: 09/13/2022 3:08 PM     Lab Results   Component Value Date    A1C 6.1 (H) 11/21/2022     ____________________________________________________  Assessment   Diagnosis ICD-10-CM Associated Orders   1. 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 We discussed conservative measures such as the over the counter medications below, oral corticosteroid tapers, as well as physical therapy focused on core strengthening and gait training. Other option in consideration is seeing pain medicine to discuss if injection or medication options would be helpful.    01/09/2023 - We will set them up with referral to Dr. Sandie Ano for surgical consult and we will follow-up with them then.      Over the counter Anti-Inflammatory Options  Below is a list of over the counter anti-inflammatories to help with your joint pain. Check with your primary care provider as well to determine if you should avoid any of these medications and/or supplements due to health concerns or other medications you may be taking.    The following should be taken for every day for two weeks to determine efficacy:    Ibuprofen - 600-800mg  every 8 hours    OR  Aleve - 500mg  every 12 hours    The following are not FDA regulated, and are supplements. Different brands of these medications may have different directions, so take according the label's recommendations and check with your primary care provider to ensure they do not interfere with your current medications or health concerns.    Over the counter supplements:    Nervive Nerve Relief  Glucosamine  Fish Oil  Tumeric Supplements  Cinnamon Supplements  CBD Oil either in capsule or topical application    This is not considered a comprehensive list and each of the supplements and medications should be discussed with our providers and your own primary care provider to determine safety.    Patient advised that if they have any questions at any time, the best possible contact method for our team and our staff is via [employer], with the next option being the phone number below that goes to our clinical staff line.    Holly Bodily, PA  Department of Neurosurgery, Spine Division  Phone: [phone]  Fax: [phone]

## 2023-01-09 NOTE — Unmapped (Signed)
[facility]  Established Patient Clinic Note    Assessment/Plan:   Diabetes mellitus, type 2 (CMS-HCC)  // Diabetes Type 2: controlled  - Recent Labs:   Lab Results   Component Value Date    Hemoglobin A1C 6.1 (H) 11/21/2022    Hemoglobin A1C 5.6 08/31/2022    Hemoglobin A1C 7.2 (H) 04/21/2022    Hemoglobin A1C 6.5 (A) 09/14/2021    Hemoglobin A1C 6.4 (A) 06/24/2021    LDL Calculated 59 11/21/2022      - The 10-year ASCVD risk score (Arnett DK, et al., 2019) is: 31.6% on ASA/Statin Prevention Therapy No.  - Treated with: Semaglutide, jardiance and Metformin (Glucophage) 1000mg  BID  - Plan Discussed general issues about diabetes pathophysiology and management.      Hyperlipidemia associated with type 2 diabetes mellitus (CMS-HCC)  // Hyperlipidemia:    - On atorvastatin 40mg   - Education regarding hyperlipidemia was given. Questions answered.  -   Lab Results   Component Value Date    LDL Calculated 59 11/21/2022    HDL 46 11/21/2022    Triglycerides 103 11/21/2022     - The 10-year ASCVD risk score (Arnett DK, et al., 2019) is: 31.6%       Hypertension associated with diabetes (CMS-HCC)  // Hypertension: BP at goal?: at goal below 130/80   BP Readings from Last 3 Encounters:   11/21/22 98/70   09/26/22 122/77   09/23/22 100/78     - Last Labs:    Lab Results   Component Value Date    CREATININE 1.17 11/21/2022    K 4.2 11/21/2022    NA 142 11/21/2022    CO2 31.3 (H) 11/21/2022     - The patient's calculated ASCVD 10 Year Risk Score is 31.6%.   - Treated with: amlodipine (Norvasc) 2.5mg  and losartan (Cozaar) 25mg   and hydrochlorothiazide 25mg . Consider stopping hydrochlorothiazide and continue lasix for fluid control if hypotensive any more. Follow up with cardiology for repeat echo and sotalol decrease to prevent pre-syncope  - Instructed to measure home BP weekly and record; instructional handout given.  - Control dietary sodium and daily exercise.  - Follow up: 3 months      Syrinx of spinal cord (CMS-HCC)  //Back Pain likely due to syrinx and degenerative disc disease with MRI evidence of stenosis at L3-S1  - Follows with PT and pain Center s/p injections.   - Pain control with gabapentin, Cymbalta, Tylneol and injections; maximize gabapentin to 1200mg  TID. Trial of tramadol PRN for break through pain.     NSG to consider Surgery.            HEALTH MAINTENANCE ITEMS STILL DUE:  Health Maintenance Due   Topic Date Due    COPD Spirometry  Never done    Retinal Eye Exam  Never done    Zoster Vaccines (1 of 2) Never done    DTaP/Tdap/Td Vaccines (1 - Tdap) 10/31/2006    Urine Albumin/Creatinine Ratio  11/08/2021    COVID-19 Vaccine (6 - 2023-24 season) 12/31/2022       Subjective   Paul Velez is a 77 y.o. male  coming to clinic today for the following issues:    Chief Complaint   Patient presents with    Sciatica       HPI    Back Pain:   - Met with NSG this AM. MRI with syrinx. Pain is unbearable. Cannot move to do most daily activities. Spasms and pain.  Feels helpless. Does not want to have to wait for surgery to start reconditioning. What optiosn for pain.     Paul Velez is a 77 y.o. male, with the following medical problems as documented above:      ROS    I have reviewed the problem list, medications, and allergies and have updated/reconciled them if needed.    Paul Velez  reports that he quit smoking about 53 years ago. His smoking use included cigarettes. He has never used smokeless tobacco.    Objective     VITALS: BP 122/80  - Pulse 50  - Temp 36.6 ??C (97.8 ??F) (Oral)  - Resp 14  - SpO2 93%     Wt Readings from Last 6 Encounters:   01/09/23 93.9 kg (207 lb)   10/12/22 93.9 kg (207 lb)   09/26/22 94.9 kg (209 lb 3.2 oz)   08/31/22 95.7 kg (211 lb)   05/19/22 89.4 kg (197 lb)   11/30/21 (!) 107.5 kg (237 lb)        Physical Exam  Vitals reviewed.   Constitutional:       General: He is not in acute distress.     Appearance: Normal appearance. He is well-developed. He is not diaphoretic.   HENT:      Head: Normocephalic and atraumatic.      Right Ear: Hearing normal.      Left Ear: Hearing normal.      Nose: Nose normal.   Eyes:      General: No scleral icterus.        Right eye: No discharge.         Left eye: No discharge.      Conjunctiva/sclera: Conjunctivae normal.      Right eye: Right conjunctiva is not injected.      Left eye: Left conjunctiva is not injected.   Neck:      Thyroid: No thyroid mass.      Vascular: No JVD.      Trachea: Trachea and phonation normal.   Cardiovascular:      Rate and Rhythm: No extrasystoles are present.     Pulses:           Radial pulses are 2+ on the right side and 2+ on the left side.      Heart sounds: No murmur heard.  Pulmonary:      Effort: Pulmonary effort is normal.      Comments: Oxygen 2L [place]    Musculoskeletal:         General: No deformity. Normal range of motion.      Cervical back: Normal range of motion.      Right lower leg: No edema.      Left lower leg: No edema.   Skin:     General: Skin is warm and dry.      Capillary Refill: Capillary refill takes less than 2 seconds.      Findings: No bruising, erythema, lesion or rash.      Nails: There is no clubbing.   Neurological:      Mental Status: He is alert and oriented to person, place, and time. Mental status is at baseline.      Cranial Nerves: No cranial nerve deficit.      Motor: Weakness present.      Gait: Gait abnormal.      Comments: Ambulate with wheelchair   Psychiatric:         Mood and Affect: Mood  normal. Mood is not anxious.         Speech: Speech normal.         Behavior: Behavior normal.         Thought Content: Thought content normal.         Judgment: Judgment normal.         LABS/IMAGING  I have reviewed pertinent recent labs and imaging in Epic    Veatrice Kells, MD  [facility]  [facility]   [address] [place], [place] [address]  Telephone [phone]  Fax [phone]

## 2023-01-17 NOTE — Unmapped (Signed)
[facility]  [facility]UL[facility]ARE CENTER  [address]                                 [place], [place] [address]    [phone]    Paul Velez did not show for their scheduled physical therapy follow-up session. Patient did not notify us prior to missing our scheduled appointment. This is the patient's 1st no-show, late arrival, or late-cancellation.  Patients who no show, arrive late (and cannot be accommodated on the same day), or cancel within 24 hours of the scheduled appointment three times within 12 months with an individual clinic or provider, can be dismissed from the provider's or discipline's clinic and will have their no show frequency displayed in the [facility] (did not keep appointment) graph on the patient's appointment desk. Patient dismissals for repeat offenders gives providers the opportunity to improve access for new patients. All patient dismissals are made at the provider's discretion.     Please contact me if you have any questions or concerns.         Signed: Timmothy Euler, PT  01/17/2023 9:59 AM

## 2023-01-24 ENCOUNTER — Ambulatory Visit: Admit: 2023-01-17 | Payer: MEDICARE

## 2023-01-24 NOTE — Unmapped (Signed)
[facility]  AMBULATORY CARE CENTER  [address]                                 [place], [place] [address]    [phone]    [employer] did not show for their scheduled physical therapy follow-up session. Patient did not notify us prior to missing our scheduled appointment. This is the patient's SECOND no-show, late arrival, or late-cancellation.  Patients who no show, arrive late (and cannot be accommodated on the same day), or cancel within 24 hours of the scheduled appointment three times within 12 months with an individual clinic or provider, can be dismissed from the provider's or discipline's clinic and will have their no show frequency displayed in the [facility] (did not keep appointment) graph on the patient's appointment desk. Patient dismissals for repeat offenders gives providers the opportunity to improve access for new patients. All patient dismissals are made at the provider's discretion.     Please contact me if you have any questions or concerns.      Thank you for this referral,     Signed: Army Chaco, PT  01/24/2023 11:49 AM

## 2023-01-30 ENCOUNTER — Ambulatory Visit: Admit: 2023-01-30 | Discharge: 2023-01-31 | Payer: MEDICARE

## 2023-01-30 DIAGNOSIS — G373 Acute transverse myelitis in demyelinating disease of central nervous system: Principal | ICD-10-CM

## 2023-01-30 NOTE — Unmapped (Incomplete)
VISIT DATE: 01/30/2023     CHIEF COMPLAINT: No chief complaint on file.       HPI:  Paul Velez is a 77 y.o. male, with a past medical history of HLD, DMT2, CHF, COPD, a-fib, sarcoidosis, and HTN, who presents to clinic for a neurosurgical consult regarding lumbar radiculopathy radiating into posterior aspect of BLE (R>L) 2/2 MRI evidence of stenosis at L3-S1.    Patient has had previous referral to physical therapy, LESIs, and medical therapy (Gabapentin, Tramadol, and Celebrex) with no long-lasting relief.  He endorses that his symptoms started worsening around since February 2023. He has a lot of difficulty ambulating.     Of note, he did not attend recent PT session(s) scheduled on 01/17/23 and 01/24/23.    IMAGING:  01/05/2023 - MRI Thoracic Spine w/ and w/o Contrast  Impression:   Similar cord signal abnormality at T6, without postcontrast enhancement. Differential remains broad and includes autoimmune conditions, infarct or evolving syrinx. Imaging findings do not favor neoplasm.      Multilevel degenerative changes, unchanged from prior.     11/06/2022 - MRI Cervical Spine w/o Contrast  Impression:   --Cervical degenerative disc disease and facet arthropathy. There is multilevel neuroforaminal narrowing, severe on the right at C5-C6.      --No cord signal abnormality.     09/13/2022 - MRI Lumbar Spine w/o Contrast  Impression:   Multilevel degenerative changes throughout the lumbar spine, most notably producing:   1. Severe neuroforaminal narrowing at L3-L4 bilaterally, at L4-L5 on the right, and L L5-S1 bilaterally.   2. Moderate spinal canal narrowing at the L3-L4 level.       OTHER STUDIES:    No results found for this or any previous visit.       Past Medical History:   Diagnosis Date    Allergic     CHF (congestive heart failure) (CMS-HCC)     COPD (chronic obstructive pulmonary disease) (CMS-HCC)     Diabetes type 2, controlled (CMS-HCC)     HLD (hyperlipidemia)     HTN (hypertension)     Neuropathy OA (osteoarthritis) of knee     PAF (paroxysmal atrial fibrillation) (CMS-HCC)     Sarcoidosis        No past surgical history on file.      Current Outpatient Medications:     albuterol sulfate 90 mcg/actuation AePB, Inhale., Disp: , Rfl:     amlodipine (NORVASC) 2.5 MG tablet, Take 1 tablet (2.5 mg total) by mouth daily., Disp: 90 tablet, Rfl: 2    atorvastatin (LIPITOR) 20 MG tablet, Take 1 tablet (20 mg total) by mouth daily., Disp: 90 tablet, Rfl: 2    celecoxib (CELEBREX) 200 MG capsule, Take 1 capsule (200 mg total) by mouth two (2) times a day as needed for pain. (Patient taking differently: Take 2 capsules (400 mg total) by mouth daily as needed for pain.), Disp: 180 capsule, Rfl: 3    DULoxetine (CYMBALTA) 60 MG capsule, Take 1 capsule (60 mg total) by mouth daily., Disp: 90 capsule, Rfl: 2    empty container Misc, Use as directed to dispose of [employer]., Disp: 1 each, Rfl: 2    furosemide (LASIX) 80 MG tablet, Take 1 tablet (80 mg total) by mouth daily., Disp: 90 tablet, Rfl: 2    gabapentin (NEURONTIN) 600 MG tablet, Take 2 tablets (1,200 mg total) by mouth Three (3) times a day., Disp: 540 tablet, Rfl: 3    hydroCHLOROthiazide 12.5 MG tablet,  TAKE 1 TABLET BY MOUTH DAILY, Disp: 100 tablet, Rfl: 1    JARDIANCE 10 mg tablet, TAKE 1 TABLET BY MOUTH DAILY, Disp: 30 tablet, Rfl: 10    losartan (COZAAR) 50 MG tablet, Take 1 tablet (50 mg total) by mouth daily., Disp: , Rfl:     metFORMIN (GLUCOPHAGE) 1000 MG tablet, TAKE ONE-HALF TABLET BY MOUTH IN THE MORNING AND ONE-HALF TABLET  BY MOUTH IN THE EVENING WITH  MEALS, Disp: 100 tablet, Rfl: 1    ONETOUCH VERIO TEST STRIPS Strp, Use to check blood sugar twice daily as directed., Disp: 100 strip, Rfl: 3    risankizumab-rzaa (SKYRIZI) 150 mg/mL PnIj, Inject the contents of 1 pen (150mg ) under the skin every 12 weeks as maintenance., Disp: 1 mL, Rfl: 0    ROCKLATAN 0.02-0.005 % Drop, Administer 1 drop to both eyes nightly., Disp: , Rfl:     sotalol (BETAPACE) 80 MG tablet, Take 1 tablet (80 mg total) by mouth two (2) times a day. (Patient taking differently: Take 1 tablet (80 mg total) by mouth two (2) times a day. 80 mg once daily), Disp: 180 tablet, Rfl: 3    tizanidine (ZANAFLEX) 2 MG tablet, Take 1 tablet (2 mg total) by mouth nightly as needed (muscle cramps)., Disp: 30 tablet, Rfl: 1    traMADol (ULTRAM) 50 mg tablet, Take 1 tablet (50 mg total) by mouth every twelve (12) hours as needed for severe pain., Disp: 20 tablet, Rfl: 0    TRELEGY ELLIPTA 100-62.5-25 mcg inhaler, , Disp: , Rfl:     triamcinolone (KENALOG) 0.1 % cream, Apply topically two (2) times a day. For your legs., Disp: 454 g, Rfl: 2    umeclidinium (INCRUSE ELLIPTA) 62.5 mcg/actuation inhaler, Inhale 1 puff daily., Disp: , Rfl:     Allergies   Allergen Reactions    Rosuvastatin Anaphylaxis     felt bad    Ace Inhibitors Anxiety    Lisinopril Other (See Comments)     Other reaction(s): Mental Status Change, Cough    Brinzolamide-Brimonidine Itching     And burning  And burning          Social History     Socioeconomic History    Marital status: Widowed   Tobacco Use    Smoking status: Former     Current packs/day: 0.00     Types: Cigarettes     Quit date: 1971     Years since quitting: 53.7    Smokeless tobacco: Never   Substance and Sexual Activity    Alcohol use: Not Currently   Other Topics Concern    Do you use sunscreen? Yes    Tanning bed use? No    Are you easily burned? No    Excessive sun exposure? No    Blistering sunburns? No     Social Determinants of Health     Financial Resource Strain: Low Risk  (02/13/2017)    Received from [facility], [facility]    Overall Financial Resource Strain (CARDIA)     Difficulty of Paying Living Expenses: Not hard at all   Food Insecurity: No Food Insecurity (04/21/2022)    Hunger Vital Sign     Worried About Running Out of Food in the Last Year: Never true     Ran Out of Food in the Last Year: Never true Transportation Needs: No Transportation Needs (02/13/2017)    [facility] from [facility], [facility]  PRAPARE - Therapist, art (Medical): No     Lack of Transportation (Non-Medical): No        Family History   Problem Relation Age of Onset    Melanoma Neg Hx     Basal cell carcinoma Neg Hx     Squamous cell carcinoma Neg Hx         ROS - as stated above in HPI & otherwise negative    PHYSICAL EXAM:  There were no vitals taken for this visit. There is no height or weight on file to calculate BMI.   AOx4. NAD. Presents in wheelchair and with oxygen machine. Accompanied by son.       ASSESSMENT & PLAN:   Paul Velez is a 77 y.o. male, with a past medical history of HLD, DMT2, CHF, COPD, a-fib, sarcoidosis, and HTN, who presents to clinic for a neurosurgical consult regarding lumbar radiculopathy into BLE (R>L) 2/2 MRI evidence of stenosis at L3-S1.  We reviewed imaging which revealed prior evidence of stroke, T6 signal abnornality, and severe neuroforaminal narrowing at L3-L4 bilaterally, at L4-L5 on the right, and L5-S1 bilaterally.  We discussed treatment options including surgical intervention vs conservative treatment.   I advised him to try to incorporate a HEP because physically, he is deconditioned and is currently not a surgical candidate.  Regarding T6 abnormality, patient states he has not followed up with a Neurologist. We will submit a referral to Neurology for an evaluation.  Patient amenable to treatment plan. He will return to clinic for a re-evaluation, and if he has incrementally increased his physical movement/exercise, in 3 months.  All of his questions were answered in clinic today.    I spent *** minutes of combined time in pre chart review, imaging review, placing orders, and with the patient.    Mattie Marlin, MD, MBA, MS, FAANS  Minimally Invasive and Complex Spine Surgery  Clinical Associate Professor of Neurosurgery  [facility] [employer] [facility]     Scribe's Attestation: Mattie Marlin, MD obtained and performed the history, physical exam and medical decision making elements that were entered into the chart.  Signed by Jiles Harold, Scribe, on January 30, 2023 at 1:53 PM.

## 2023-01-31 ENCOUNTER — Ambulatory Visit
Admit: 2023-01-31 | Payer: MEDICARE | Attending: Rehabilitative and Restorative Service Providers" | Primary: Rehabilitative and Restorative Service Providers"

## 2023-01-31 NOTE — Unmapped (Signed)
[facility]  AMBULATORY CARE CENTER  [address]                                 [place], [place] [address]    [phone]    Paul Velez did not show for their scheduled physical therapy follow-up session. Patient did not notify us prior to missing our scheduled appointment. This is the patient's 3rd no-show, late arrival, or late-cancellation.  Patients who no show, arrive late (and cannot be accommodated on the same day), or cancel within 24 hours of the scheduled appointment three times within 12 months with an individual clinic or provider, will be dismissed.     Please contact me if you have any questions or concerns.      Thank you for this referral,     Signed: Timmothy Euler, PT  01/31/2023 9:46 AM

## 2023-02-01 MED ORDER — METFORMIN 1,000 MG TABLET
ORAL_TABLET | 1 refills | 0 days | Status: CP
Start: 2023-02-01 — End: ?

## 2023-02-01 NOTE — Unmapped (Signed)
Patient is requesting the following refill  Requested Prescriptions     Pending Prescriptions Disp Refills    metFORMIN (GLUCOPHAGE) 1000 MG tablet [Pharmacy Med Name: METFORMIN 1000MG  TABLETS] 90 tablet 0     Sig: TAKE 1/2 TABLET(500 MG) BY MOUTH IN THE MORNING AND IN THE EVENING WITH MEALS       Recent Visits  Date Type Provider Dept   01/09/23 Office Visit Claudean Kinds, MD [facility]   11/21/22 Office Visit Criselda Peaches, Lidia Collum, MD [facility]   09/26/22 Office Visit Criselda Peaches, Lidia Collum, MD [facility]   09/23/22 Office Visit [name], [name], [place] [facility]   08/02/22 Office Visit Claudean Kinds, MD [facility]   06/03/22 Office Visit Passannante, Braulio Conte, [place] [facility] Services   04/21/22 Office Visit Claudean Kinds, MD [facility]   03/27/22 Office Visit [name], [name], [place] [facility] Acute Care Services   03/06/22 Office Visit Claudean Kinds, MD [facility]   Showing recent visits within past 365 days and meeting all other requirements  Future Appointments  Date Type Provider Dept   04/10/23 Appointment Criselda Peaches Lidia Collum, MD [facility]   08/06/23 Appointment Criselda Peaches, Lidia Collum, MD [facility]   Showing future appointments within next 365 days and meeting all other requirements       Labs: A1c:   Hemoglobin A1C (%)   Date Value   11/21/2022 6.1 (H)   09/14/2021 6.5 (A)

## 2023-02-07 ENCOUNTER — Ambulatory Visit: Admit: 2023-01-31 | Payer: MEDICARE

## 2023-02-07 ENCOUNTER — Ambulatory Visit: Admit: 2023-02-07 | Discharge: 2023-02-10 | Payer: MEDICARE

## 2023-02-07 NOTE — Unmapped (Incomplete)
For the psoriasis:  - ***

## 2023-02-07 NOTE — Unmapped (Signed)
[facility]  AMBULATORY CARE CENTER  [address]                                 [place], [place] [address]    [phone]    Paul Velez did not show for their scheduled physical therapy follow-up session. Patient did not notify us prior to missing our scheduled appointment.     Please contact me if you have any questions or concerns.      Thank you for this referral,     Signed: Timmothy Euler, PT  02/07/2023 9:55 AM

## 2023-02-07 NOTE — Unmapped (Unsigned)
Dermatology Note     Assessment and Plan:      Benign Lesions/ Findings:   {derm skin check benign A&P:75424}  - Reassurance provided regarding the benign appearance of lesions noted on exam today; no treatment is indicated in the absence of symptoms/changes.  - Reinforced importance of photoprotective strategies including liberal and frequent sunscreen use of a broad-spectrum SPF 30 or greater, use of protective clothing, and sun avoidance for prevention of cutaneous malignancy and photoaging.  Counseled patient on the importance of regular self-skin monitoring as well as routine clinical skin examinations as scheduled.     Plaque psoriasis- chronic, flaring   - Less than 5% BSA involved  - Previously failed high potency topical steroids  - Treatment options were discussed with the patient. Patient has no history of IBD.   - Continue secukinumab (COSENTYX PEN, 2 PENS,) 150 mg/mL PnIj injection; Inject 2 mL (300 mg total) under the skin every twenty-eight (28) days.  - Start triamcinolone (KENALOG) 0.1 % cream; Apply topically Two (2) times a day. For your legs.  - Start hydrocortisone 2.5 % cream; Apply topically Two (2) times a day. Apply behind your ears.    High risk medication use  - Hepatitis B panel obtained on 06/24/2021 - Negative  - Hepatitis B panel obtained on 09/14/2021 - Negative  - Quantiferon Gold obtained on 09/14/2021 - Negative   - Reordered today  - CBC, CMP, lipid panel, A1C obtained on 11/21/2022 - stable  - PSA obtained on 12/26/2021 - WNL    There are no diagnoses linked to this encounter.    The patient was advised to call for an appointment should any new, changing, or symptomatic lesions develop.     RTC: No follow-ups on file. or sooner as needed   _________________________________________________________________      Chief Complaint     No chief complaint on file.      HPI     Paul Velez is a 77 y.o. male who presents as a returning patient (last seen by Dr. Bennie Hind on 12/15/2021) to Dermatology for follow up of plaque psoriasis. At last visit, patient was to continue secukinumab 150 mg injections every 28 days, start triamcinolone 0.1% ointment, and start hydrocortisone 2.5% cream for plaque psoriasis.    Plaque Psoriasis  - ***    The patient denies any other new or changing lesions or areas of concern.     Pertinent Past Medical History     No history of skin cancer  Psoriasis  T2DM  Diabetic neuropathy  CHF  COPD  HTN    Family History:   Negative for melanoma    Past Medical History, Family History, Social History, Medication List, Allergies, and Problem List were reviewed in the rooming section of Epic.     ROS: Other than symptoms mentioned in the HPI, no fevers, chills, or other skin complaints    Physical Examination     GENERAL: Well-appearing male in no acute distress, resting comfortably.  NEURO: Alert and oriented, answers questions appropriately  PSYCH: Normal mood and affect  {PE extent:75514}  {PE list:75421}  - ***    All areas not commented on are within normal limits or unremarkable    Scribe's Attestation: Cruzita Lederer, MD obtained and performed the history, physical exam and medical decision making elements that were entered into the chart. Signed by Cherlynn Kaiser, Scribe, on ***    {*** NOTE TO PROVIDER: PLEASE ADD ATTESTATION NOTING YOU AGREE WITH SCRIBE DOCUMENTATION}     (  Approved Template 01/12/2020)

## 2023-02-21 ENCOUNTER — Ambulatory Visit: Admit: 2023-02-21 | Payer: MEDICARE

## 2023-02-21 ENCOUNTER — Ambulatory Visit
Admit: 2023-02-21 | Payer: MEDICARE | Attending: Rehabilitative and Restorative Service Providers" | Primary: Rehabilitative and Restorative Service Providers"

## 2023-02-21 NOTE — Unmapped (Signed)
[facility]  AMBULATORY CARE CENTER  [address]                                 [place], [place] [address]    [phone]    This note is to let you know that Truitt Leep did not show for their scheduled Physical Therapy follow-up session.  Bookert Redeker has arrived late, canceled late (<24 hrs), or no-showed on SIX Consecutive  occassions and is discharged from physical therapy at this time.  Patients who no show, arrive late (and cannot be accommodated on the same day), or cancel within 24 hours of the scheduled appointment three times within 12 months with an individual clinic or provider, can be dismissed from the provider's or discipline's clinic and will have their no show frequency displayed in the [facility] (did not keep appointment) graph on the patient's appointment desk. Patient dismissals for repeat offenders gives providers the opportunity to improve access for new patients. All patient dismissals are made at the provider's discretion.    Please contact me if you have any questions or concerns.    Thank you for this referral,     Signed: Army Chaco, PT  02/21/2023 10:00 AM

## 2023-03-06 ENCOUNTER — Ambulatory Visit: Admit: 2023-03-06 | Discharge: 2023-03-07 | Payer: MEDICARE

## 2023-03-06 DIAGNOSIS — G950 Syringomyelia and syringobulbia: Principal | ICD-10-CM

## 2023-03-06 DIAGNOSIS — M549 Dorsalgia, unspecified: Principal | ICD-10-CM

## 2023-03-06 DIAGNOSIS — M5030 Other cervical disc degeneration, unspecified cervical region: Principal | ICD-10-CM

## 2023-03-06 DIAGNOSIS — F32A Depression, unspecified depression type: Principal | ICD-10-CM

## 2023-03-06 DIAGNOSIS — M5430 Sciatica, unspecified side: Principal | ICD-10-CM

## 2023-03-06 DIAGNOSIS — M51362 Degeneration of intervertebral disc of lumbar region with discogenic back pain and lower extremity pain: Principal | ICD-10-CM

## 2023-03-06 NOTE — Unmapped (Signed)
Assessment/Plan:        Diagnoses and all orders for this visit:    Back pain with sciatica    Sciatic leg pain    Syrinx of spinal cord (CMS-HCC)    Degenerative disc disease, cervical    Degeneration of intervertebral disc of lumbar region with discogenic back pain and lower extremity pain    Depression, unspecified depression type      PCP is not in office currently, will send message requesting referral to Home Health for PT. Neurosurg noted pt is currently not a surgical candidate due to deconditioned status and will need HEP.     Will also check w/ PCP to see if Cymbalta ok to take at 120mg /d.     Pt will try to take gabapentin 1200mg  tid as indicated in chart to see if this provides some reduction in pain.       Pt and partner reminded to make Neurosurg follow up appt as, upon chart review, it appears next appt was canceled.     Keep PCP appt in December to review care plan and discuss possible GI eval if they have not made that appt based on the prior referral.   Pt previously reported some difficulty swallowing at 03/27/2022 visit, was referred to GI but that appt didn't happen.      Accompanied by partner/caregiver Paul Velez today. Mr. Paul Velez gives verbal permission to discuss his care with her today and on future phone calls.    HOPI:      Patient ID:  Paul Velez is a 77 y.o. male.  Main concern is sciatica. States he cannot do anything due to the pain. Affecting his appetite. Toileting is difficult, sitting on toilet is very painful. Has trouble walking, here in w/c, has used rolling walker in past. Not able to make it to PT due to difficulty with mobility 2/2 pain. Was not comfortable traveling the distance to PT, canceled appts.    Has been seen at pain clinic in [place], [place] in the past. Pt states he did not find that a satisfactory option.      Taking cymbalta and thinks it helps when he takes 2 at a time. Does not feel tramadol helps. Feels nerve pain is worsening. He states he is taking his gabapentin 2 bid, not tid as noted in chart    Review of Systems:  + back pain, + LE pain, + LE weakness, no LE edema, Pt notes muscle loss in LE, + depression        Past Medical/Surgical History:  Past Medical History:   Diagnosis Date    Allergic     CHF (congestive heart failure) (CMS-HCC)     COPD (chronic obstructive pulmonary disease) (CMS-HCC)     Diabetes type 2, controlled (CMS-HCC)     HLD (hyperlipidemia)     HTN (hypertension)     Neuropathy     OA (osteoarthritis) of knee     PAF (paroxysmal atrial fibrillation) (CMS-HCC)     Sarcoidosis      No past surgical history on file.    Allergies:  He is allergic to rosuvastatin, ace inhibitors, lisinopril, and brinzolamide-brimonidine.    Current Medications:  Current Outpatient Medications   Medication Sig Dispense Refill    furosemide (LASIX) 80 MG tablet Take 1 tablet (80 mg total) by mouth daily. 90 tablet 2    albuterol sulfate 90 mcg/actuation AePB Inhale.      amlodipine (NORVASC) 2.5 MG tablet Take 1  tablet (2.5 mg total) by mouth daily. 90 tablet 2    atorvastatin (LIPITOR) 20 MG tablet Take 1 tablet (20 mg total) by mouth daily. 90 tablet 2    celecoxib (CELEBREX) 200 MG capsule Take 1 capsule (200 mg total) by mouth two (2) times a day as needed for pain. (Patient taking differently: Take 2 capsules (400 mg total) by mouth daily as needed for pain.) 180 capsule 3    DULoxetine (CYMBALTA) 60 MG capsule Take 1 capsule (60 mg total) by mouth daily. 90 capsule 2    empty container Misc Use as directed to dispose of [employer]. 1 each 2    gabapentin (NEURONTIN) 600 MG tablet Take 2 tablets (1,200 mg total) by mouth Three (3) times a day. 540 tablet 3    hydroCHLOROthiazide 12.5 MG tablet TAKE 1 TABLET BY MOUTH DAILY 100 tablet 1    JARDIANCE 10 mg tablet TAKE 1 TABLET BY MOUTH DAILY 30 tablet 10    losartan (COZAAR) 50 MG tablet Take 1 tablet (50 mg total) by mouth daily.      metFORMIN (GLUCOPHAGE) 1000 MG tablet TAKE 1/2 TABLET(500 MG) BY MOUTH IN THE MORNING AND IN THE EVENING WITH MEALS (Patient not taking: Reported on 03/06/2023) 90 tablet 1    ONETOUCH VERIO TEST STRIPS Strp Use to check blood sugar twice daily as directed. 100 strip 3    risankizumab-rzaa (SKYRIZI) 150 mg/mL PnIj Inject the contents of 1 pen (150mg ) under the skin every 12 weeks as maintenance. 1 mL 0    ROCKLATAN 0.02-0.005 % Drop Administer 1 drop to both eyes nightly.      sotalol (BETAPACE) 80 MG tablet Take 1 tablet (80 mg total) by mouth two (2) times a day. (Patient taking differently: Take 1 tablet (80 mg total) by mouth two (2) times a day. 80 mg once daily) 180 tablet 3    tizanidine (ZANAFLEX) 2 MG tablet Take 1 tablet (2 mg total) by mouth nightly as needed (muscle cramps). 30 tablet 1    traMADol (ULTRAM) 50 mg tablet Take 1 tablet (50 mg total) by mouth every twelve (12) hours as needed for severe pain. 20 tablet 0    TRELEGY ELLIPTA 100-62.5-25 mcg inhaler       triamcinolone (KENALOG) 0.1 % cream Apply topically two (2) times a day. For your legs. (Patient not taking: Reported on 03/06/2023) 454 g 2    umeclidinium (INCRUSE ELLIPTA) 62.5 mcg/actuation inhaler Inhale 1 puff daily.       No current facility-administered medications for this visit.                   Physical Exam:     Visit Vitals  BP 140/86   Pulse (!) 46   Temp 36.8 ??C (98.3 ??F) (Oral)   Resp 19        Physical Exam  Constitutional:       Appearance: He is normal weight.      Comments: In W/C. 5lb weight loss since 08/2022   Eyes:      General: No scleral icterus.     Conjunctiva/sclera: Conjunctivae normal.   Pulmonary:      Effort: Pulmonary effort is normal.      Comments: On O2 via [place] in room  Neurological:      Mental Status: He is alert and oriented to person, place, and time.   Psychiatric:         Mood and Affect: Mood normal.  Behavior: Behavior normal.         Thought Content: Thought content normal.          Labs:  No results found for this or any previous visit (from the past 24 hour(s)).

## 2023-03-09 NOTE — Unmapped (Signed)
I spoke with [name] to review one bridge had already been provided and clinic would not authorize more Skyrizi until he's seen. Currently has a visit for 12/12. Will wait for a new order and then call patient to schedule delivery.     Patient's injection would be due on 11/19.    Patient expressed understanding.     Breta Demedeiros A. Katrinka Blazing, PharmD, BCPS - Clinical Pharmacist   [facility]    [address] [place], [place] [address]  t [phone], opt 4 then 2 - f [phone]

## 2023-04-03 NOTE — Unmapped (Unsigned)
[facility]  Established Patient Clinic Note    Assessment/Plan:   No problem-specific Assessment & Plan notes found for this encounter.      HEALTH MAINTENANCE ITEMS STILL DUE:  Health Maintenance Due   Topic Date Due   ??? COPD Spirometry  Never done   ??? Retinal Eye Exam  Never done   ??? Zoster Vaccines (1 of 2) Never done   ??? DTaP/Tdap/Td Vaccines (1 - Tdap) 10/31/2006   ??? Urine Albumin/Creatinine Ratio  11/08/2021   ??? COVID-19 Vaccine (6 - 2024-25 season) 12/31/2022       PHQ-2 Score:      PHQ-9 Score:      [name] Score:      {select_status_or_delete_smartlist:64641}    Subjective   Mr. Meroney is a 77 y.o. male  coming to clinic today for the following issues:    No chief complaint on file.      HPI    ***    Manav Pierotti is a 77 y.o. male, with the following medical problems as documented above:      ROS    I have reviewed the problem list, medications, and allergies and have updated/reconciled them if needed.    Mr. Tessler  reports that he quit smoking about 53 years ago. His smoking use included cigarettes. He has never used smokeless tobacco.    Objective     VITALS: There were no vitals taken for this visit.    Wt Readings from Last 6 Encounters:   03/06/23 93.4 kg (206 lb)   01/30/23 93.9 kg (207 lb)   01/09/23 93.9 kg (207 lb)   10/12/22 93.9 kg (207 lb)   09/26/22 94.9 kg (209 lb 3.2 oz)   08/31/22 95.7 kg (211 lb)        Physical Exam    LABS/IMAGING  I have reviewed pertinent recent labs and imaging in Epic    Veatrice Kells, MD  [facility]  [facility]   [address] [place], [place] [address] ??? Telephone [phone] ??? Fax [phone]

## 2023-04-03 NOTE — Unmapped (Signed)
I have tried a couple times since last visit to reach the pt. Wanted to advise him that Dr. Criselda Peaches recommended caution increasing the Cymbalta dose and would be best if he could remain where he was at with 1 cap/day (60mg /d).   Dr. Criselda Peaches was also fine sending in a referral to [facility] PT if pt has been unable to attend ambulatory PT appts.   Please prompt the [facility] referral and send to Dr. Criselda Peaches once you have spoken to the pt. Remind pt to keep his upcoming appt here. Pt gave verbal permission at his last appt for [place] to speak w/ his partner/caregiver.     Beth

## 2023-04-04 NOTE — Unmapped (Signed)
Called and left a voicemail to call us back.

## 2023-04-04 NOTE — Unmapped (Signed)
Patient called back and I passed along message. Patient said he would cut down on cymbalta, and discuss things with Dr. Criselda Peaches at his appointment next week.

## 2023-04-09 NOTE — Unmapped (Unsigned)
Dermatology Note     Assessment and Plan:      Plaque psoriasis- chronic, flaring   - Less than 5% BSA involved  - Previously failed high potency topical steroids  - Treatment options were discussed with the patient. Patient has no history of IBD.   - Continue *** secukinumab (COSENTYX PEN, 2 PENS,) 150 mg/mL PnIj injection; Inject 2 mL (300 mg total) under the skin every twenty-eight (28) days.  - Continue *** triamcinolone (KENALOG) 0.1 % cream; Apply topically Two (2) times a day. For your legs.  - Continue *** hydrocortisone 2.5 % cream; Apply topically Two (2) times a day. Apply behind your ears.    High risk medication use  - Hep C Antibody obtained on 06/24/2021 - Negative  - Quantiferon Gold and Hep B panel obtained on 09/14/2021 - Negative  - CBC, CMP, Lipid Panel, and Hemoglobin A1c obtained on 11/21/2022 - stable   - PSA obtained on 12/16/2021 - WNL    There are no diagnoses linked to this encounter.    The patient was advised to call for an appointment should any new, changing, or symptomatic lesions develop.     RTC: No follow-ups on file. or sooner as needed   _________________________________________________________________      Chief Complaint     No chief complaint on file.      HPI     Paul Velez is a 77 y.o. male who presents as a returning patient (last seen by Dr. Bennie Hind on 12/15/2021) to Dermatology for follow up of plaque psoriasis. At last visit, patient continue secukinumab 150 mg injections every 28 days, start triamcinolone 0.1% cream, and start hydrocortisone 2.5% cream for plaque psoriasis.    Plaque Psoriasis  - ***    The patient denies any other new or changing lesions or areas of concern.     Pertinent Past Medical History     No history of skin cancer  Psoriasis  T2DM  Diabetic neuropathy  CHF  COPD  HTN    Family History:   Negative for melanoma    Past Medical History, Family History, Social History, Medication List, Allergies, and Problem List were reviewed in the rooming section of Epic.     ROS: Other than symptoms mentioned in the HPI, no fevers, chills, or other skin complaints    Physical Examination     GENERAL: Well-appearing male in no acute distress, resting comfortably.  NEURO: Alert and oriented, answers questions appropriately  PSYCH: Normal mood and affect  {PE extent:75514}  {PE list:75421}  - ***    All areas not commented on are within normal limits or unremarkable    Scribe's Attestation: Cruzita Lederer, MD obtained and performed the history, physical exam and medical decision making elements that were entered into the chart. Signed by Cherlynn Kaiser, Scribe, on ***    {*** NOTE TO PROVIDER: PLEASE ADD ATTESTATION NOTING YOU AGREE WITH SCRIBE DOCUMENTATION}     (Approved Template 01/12/2020)

## 2023-04-10 ENCOUNTER — Ambulatory Visit: Admit: 2023-04-10 | Discharge: 2023-04-11 | Payer: MEDICARE

## 2023-04-10 DIAGNOSIS — I5022 Chronic systolic (congestive) heart failure: Principal | ICD-10-CM

## 2023-04-10 DIAGNOSIS — I429 Cardiomyopathy, unspecified: Principal | ICD-10-CM

## 2023-04-10 DIAGNOSIS — E1159 Type 2 diabetes mellitus with other circulatory complications: Principal | ICD-10-CM

## 2023-04-10 DIAGNOSIS — J383 Other diseases of vocal cords: Principal | ICD-10-CM

## 2023-04-10 DIAGNOSIS — I152 Hypertension secondary to endocrine disorders: Principal | ICD-10-CM

## 2023-04-10 DIAGNOSIS — I480 Paroxysmal atrial fibrillation: Principal | ICD-10-CM

## 2023-04-10 DIAGNOSIS — J9611 Chronic respiratory failure with hypoxia: Principal | ICD-10-CM

## 2023-04-10 DIAGNOSIS — E1122 Type 2 diabetes mellitus with diabetic chronic kidney disease: Principal | ICD-10-CM

## 2023-04-10 DIAGNOSIS — F39 Unspecified mood [affective] disorder: Principal | ICD-10-CM

## 2023-04-10 DIAGNOSIS — N1831 Type 2 diabetes mellitus with stage 3a chronic kidney disease, without long-term current use of insulin (CMS-HCC): Principal | ICD-10-CM

## 2023-04-10 LAB — COMPREHENSIVE METABOLIC PANEL
ALBUMIN: 3.5 g/dL (ref 3.4–5.0)
ALKALINE PHOSPHATASE: 106 U/L (ref 46–116)
ALT (SGPT): 22 U/L (ref 10–49)
ANION GAP: 13 mmol/L (ref 5–14)
AST (SGOT): 25 U/L (ref <=34)
BILIRUBIN TOTAL: 0.3 mg/dL (ref 0.3–1.2)
BLOOD UREA NITROGEN: 22 mg/dL (ref 9–23)
BUN / CREAT RATIO: 19
CALCIUM: 9.6 mg/dL (ref 8.7–10.4)
CHLORIDE: 98 mmol/L (ref 98–107)
CO2: 32 mmol/L — ABNORMAL HIGH (ref 20.0–31.0)
CREATININE: 1.18 mg/dL (ref 0.73–1.18)
EGFR CKD-EPI (2021) MALE: 64 mL/min/{1.73_m2} (ref >=60)
GLUCOSE RANDOM: 165 mg/dL (ref 70–179)
POTASSIUM: 3.8 mmol/L (ref 3.4–4.8)
PROTEIN TOTAL: 7.4 g/dL (ref 5.7–8.2)
SODIUM: 143 mmol/L (ref 135–145)

## 2023-04-10 LAB — CBC
HEMATOCRIT: 31.6 % — ABNORMAL LOW (ref 39.0–48.0)
HEMOGLOBIN: 10.2 g/dL — ABNORMAL LOW (ref 12.9–16.5)
MEAN CORPUSCULAR HEMOGLOBIN CONC: 32.3 g/dL (ref 32.0–36.0)
MEAN CORPUSCULAR HEMOGLOBIN: 28.4 pg (ref 25.9–32.4)
MEAN CORPUSCULAR VOLUME: 88 fL (ref 77.6–95.7)
MEAN PLATELET VOLUME: 8.2 fL (ref 6.8–10.7)
PLATELET COUNT: 284 10*9/L (ref 150–450)
RED BLOOD CELL COUNT: 3.59 10*12/L — ABNORMAL LOW (ref 4.26–5.60)
RED CELL DISTRIBUTION WIDTH: 15.3 % — ABNORMAL HIGH (ref 12.2–15.2)
WBC ADJUSTED: 9.1 10*9/L (ref 3.6–11.2)

## 2023-04-10 LAB — THYROID FUNCTION CASCADE: THYROID STIMULATING HORMONE: 1.354 u[IU]/mL (ref 0.550–4.780)

## 2023-04-10 LAB — HEMOGLOBIN A1C
ESTIMATED AVERAGE GLUCOSE: 148 mg/dL
HEMOGLOBIN A1C: 6.8 % — ABNORMAL HIGH (ref 4.8–5.6)

## 2023-04-10 LAB — IRON PANEL
IRON SATURATION (CALC): 5 %
IRON: 20 ug/dL — ABNORMAL LOW (ref 65–175)
TOTAL IRON BINDING CAPACITY (CALC): 364.1 ug/dL (ref 250.0–425.0)
TRANSFERRIN: 289 mg/dL (ref 215.0–365.0)

## 2023-04-10 MED ORDER — AMLODIPINE 2.5 MG TABLET
ORAL_TABLET | Freq: Every day | ORAL | 2 refills | 90 days | Status: CP
Start: 2023-04-10 — End: 2024-04-09

## 2023-04-10 MED ORDER — ATORVASTATIN 20 MG TABLET
ORAL_TABLET | Freq: Every day | ORAL | 2 refills | 90 days | Status: CP
Start: 2023-04-10 — End: ?

## 2023-04-10 MED ORDER — FUROSEMIDE 80 MG TABLET
ORAL_TABLET | Freq: Every day | ORAL | 2 refills | 90.00 days | Status: CP
Start: 2023-04-10 — End: 2024-04-09

## 2023-04-10 MED ORDER — DULOXETINE 60 MG CAPSULE,DELAYED RELEASE
ORAL_CAPSULE | Freq: Every day | ORAL | 2 refills | 90.00 days | Status: CN
Start: 2023-04-10 — End: 2024-04-09

## 2023-04-10 MED ORDER — DESVENLAFAXINE SUCCINATE ER 100 MG TABLET,EXTENDED RELEASE 24 HR
ORAL_TABLET | Freq: Every day | ORAL | 3 refills | 90.00 days | Status: CP
Start: 2023-04-10 — End: 2024-04-09

## 2023-04-10 NOTE — Unmapped (Signed)
//   Hx of Depression and Anxiety:   - Last GAD 7:      and percent change: GAD 7 change: 0  - Last PHQ9:    - Currently taking: Cymbalta with good effect on sleep and noticed increased anxiety with dose decrease.   - Change to Desvenlafaxine.

## 2023-04-10 NOTE — Unmapped (Signed)
//   Hypertension: BP at goal?: at goal below 130/80   BP Readings from Last 3 Encounters:   03/06/23 140/86   01/09/23 122/80   11/21/22 98/70     - Last Labs:    Lab Results   Component Value Date    CREATININE 1.17 11/21/2022    K 4.2 11/21/2022    NA 142 11/21/2022    CO2 31.3 (H) 11/21/2022     - The patient's calculated ASCVD 10 Year Risk Score is 51.3%.   - Treated with: amlodipine (Norvasc) 2.5mg  and losartan (Cozaar) 25mg   and hydrochlorothiazide 25mg . Consider stopping hydrochlorothiazide and continue lasix for fluid control if hypotensive any more. Follow up with cardiology for repeat echo and sotalol decrease to prevent pre-syncope  - Instructed to measure home BP weekly and record; instructional handout given.  - Control dietary sodium and daily exercise.  - Follow up: 3 months

## 2023-04-16 NOTE — Unmapped (Signed)
Patient called and his sciatica is flaring up and states that Dr. 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 He is requesting medication be called in to his preferred pharmacy on file. Thanks

## 2023-04-17 MED ORDER — TRAMADOL 50 MG TABLET
ORAL_TABLET | Freq: Two times a day (BID) | ORAL | 0 refills | 10.00 days | Status: CP | PRN
Start: 2023-04-17 — End: 2024-04-16

## 2023-04-17 NOTE — Unmapped (Signed)
Patient read message and has pain management provider. Closing thread.

## 2023-04-17 NOTE — Unmapped (Signed)
Addended by: Maree Erie on: 04/17/2023 10:59 AM     Modules accepted: Orders

## 2023-04-17 NOTE — Unmapped (Signed)
He has a pain management provider!

## 2023-04-17 NOTE — Unmapped (Signed)
Short term fill for Tramadol and will need pain management to fill long term.

## 2023-04-17 NOTE — Unmapped (Signed)
Does patient need a referral to pain management? Or is this something he should discuss when he establishes with Dr. Camillo Flaming?

## 2023-04-27 MED ORDER — METFORMIN 1,000 MG TABLET
ORAL_TABLET | Freq: Two times a day (BID) | ORAL | 1 refills | 90.00 days | Status: CP
Start: 2023-04-27 — End: ?

## 2023-04-27 NOTE — Unmapped (Signed)
Patient called and is requesting that Dr. Criselda Peaches put in orders for an iron infusion. Based on patients lab result Dr. Criselda Peaches suggested that we can do this again. Patient states that he has been feeling laxidazical and feels that it would help like before. Please advise, thank you!

## 2023-04-27 NOTE — Unmapped (Signed)
Spoke with patient who verbalized understanding. Patient is currently out of Metformin. Can you please send a refill in with new directions? Thank you!

## 2023-04-27 NOTE — Unmapped (Signed)
Patient also wanted PCP to be made aware that his blood sugars for the past three days were 191 (04/25/23), 194 (04/26/23), and 200 (today). Patient states he only checks his blood sugar once a day which is fasting. His previous blood sugar readings usually range 110 and are never around 200. Patient is no longer taking Metformin since 03/06/2023 and he took his last pill of Jardiance 10 mg yesterday. He states he is out of Jardiance and doesn't want to refill it until the new year with his new insurance. Patient denies any change to his diet as far as increasing of sugary foods. Patient does admit to eating bagel with cream cheese and cereal at times which is not unusual for him. He states his appeptite has decreased in the past 6 months. He previously used to take Ozempic 1 mg injection under the skin every 7 days but that is not on patient's current medication list and he hasn't taken it after he lost weight. Patient states he has no energy, his sciatica has been bothering him and he has been in a depressed state. He denies any suicidal thoughts. I informed patient I would send this message to the on call provider in reference his elevated blood sugars. Patient verbalized understanding. A message was also sent to PCP in reference restarting iron infusions.

## 2023-04-27 NOTE — Unmapped (Signed)
I do not know how to order iron infusions so this would have to wait for Dr. Criselda Peaches. However, the most common symptom of uncontrolled diabetes is fatigue so I would recommend restarting his metformin - perhaps at 1 tab twice daily if he does not want to take his jardiance until the new year - and follow his sugars and let Dr. Criselda Peaches know what they are after a week. Dr. Criselda Peaches returns Dec 31st.

## 2023-05-03 MED ORDER — LORAZEPAM 0.5 MG TABLET
ORAL_TABLET | Freq: Every day | ORAL | 0 refills | 10 days | Status: CP | PRN
Start: 2023-05-03 — End: 2024-05-02

## 2023-05-03 NOTE — Unmapped (Signed)
Patient called stating that he is having panic attacks and today has been the worse and he wants to get something for it, he states that he may have mentioned this, please advise, he wants something today and i offered him the clinic up the road as we have no spots open, thank you

## 2023-05-04 ENCOUNTER — Emergency Department: Payer: 59

## 2023-05-04 ENCOUNTER — Encounter: Payer: Self-pay | Admitting: Intensive Care

## 2023-05-04 ENCOUNTER — Emergency Department
Admission: EM | Admit: 2023-05-04 | Discharge: 2023-05-04 | Disposition: A | Discharge status: Home / Self Care | Payer: 59 | Attending: Emergency Medicine | Admitting: Emergency Medicine

## 2023-05-04 ENCOUNTER — Other Ambulatory Visit: Payer: Self-pay

## 2023-05-04 DIAGNOSIS — R531 Weakness: Secondary | ICD-10-CM | POA: Insufficient documentation

## 2023-05-04 DIAGNOSIS — E860 Dehydration: Secondary | ICD-10-CM | POA: Insufficient documentation

## 2023-05-04 DIAGNOSIS — F419 Anxiety disorder, unspecified: Secondary | ICD-10-CM | POA: Insufficient documentation

## 2023-05-04 DIAGNOSIS — Z1152 Encounter for screening for COVID-19: Secondary | ICD-10-CM | POA: Insufficient documentation

## 2023-05-04 LAB — TROPONIN I (HIGH SENSITIVITY)
Troponin I (High Sensitivity): 10 ng/L (ref <18)
Troponin I (High Sensitivity): 10 ng/L (ref <18)

## 2023-05-04 LAB — COMPREHENSIVE METABOLIC PANEL
ALT: 38 U/L (ref 0–44)
AST: 41 U/L (ref 15–41)
Albumin: 4 g/dL (ref 3.5–5.0)
Alkaline Phosphatase: 91 U/L (ref 38–126)
Anion gap: 13 (ref 5–15)
BUN: 25 mg/dL — ABNORMAL HIGH (ref 8–23)
CO2: 27 mmol/L (ref 22–32)
Calcium: 8.8 mg/dL — ABNORMAL LOW (ref 8.9–10.3)
Chloride: 95 mmol/L — ABNORMAL LOW (ref 98–111)
Creatinine, Ser: 1.45 mg/dL — ABNORMAL HIGH (ref 0.61–1.24)
GFR, Estimated: 50 mL/min — ABNORMAL LOW (ref >60)
Glucose, Bld: 342 mg/dL — ABNORMAL HIGH (ref 70–99)
Potassium: 3.4 mmol/L — ABNORMAL LOW (ref 3.5–5.1)
Sodium: 135 mmol/L (ref 135–145)
Total Bilirubin: 0.5 mg/dL (ref 0.0–1.2)
Total Protein: 7.9 g/dL (ref 6.5–8.1)

## 2023-05-04 LAB — CBC WITH DIFFERENTIAL/PLATELET
Abs Immature Granulocytes: 0.04 10*3/uL (ref 0.00–0.07)
Basophils Absolute: 0.1 10*3/uL (ref 0.0–0.1)
Basophils Relative: 1 %
Eosinophils Absolute: 0.5 10*3/uL (ref 0.0–0.5)
Eosinophils Relative: 4 %
HCT: 42.1 % (ref 39.0–52.0)
Hemoglobin: 12.7 g/dL — ABNORMAL LOW (ref 13.0–17.0)
Immature Granulocytes: 0 %
Lymphocytes Relative: 13 %
Lymphs Abs: 1.4 10*3/uL (ref 0.7–4.0)
MCH: 28.5 pg (ref 26.0–34.0)
MCHC: 30.2 g/dL (ref 30.0–36.0)
MCV: 94.6 fL (ref 80.0–100.0)
Monocytes Absolute: 0.7 10*3/uL (ref 0.1–1.0)
Monocytes Relative: 7 %
Neutro Abs: 7.9 10*3/uL — ABNORMAL HIGH (ref 1.7–7.7)
Neutrophils Relative %: 75 %
Platelets: 282 10*3/uL (ref 150–400)
RBC: 4.45 MIL/uL (ref 4.22–5.81)
RDW: 16.2 % — ABNORMAL HIGH (ref 11.5–15.5)
WBC: 10.5 10*3/uL (ref 4.0–10.5)
nRBC: 0 % (ref 0.0–0.2)

## 2023-05-04 LAB — RESP PANEL BY RT-PCR (RSV, FLU A&B, COVID)  RVPGX2
Influenza A by PCR: NEGATIVE
Influenza B by PCR: NEGATIVE
Resp Syncytial Virus by PCR: NEGATIVE
SARS Coronavirus 2 by RT PCR: NEGATIVE

## 2023-05-04 MED ORDER — SODIUM CHLORIDE 0.9 % IV BOLUS
500.0000 mL | Freq: Once | INTRAVENOUS | Status: AC
  Administered 2023-05-04: 500 mL via INTRAVENOUS

## 2023-05-04 MED ORDER — LORAZEPAM 0.5 MG PO TABS
0.5000 mg | ORAL_TABLET | Freq: Once | ORAL | Status: AC
  Administered 2023-05-04: 0.5 mg via ORAL
  Filled 2023-05-04: qty 1

## 2023-05-04 NOTE — ED Triage Notes (Signed)
 Reports he started feeling sick a few days ago with nausea and weakness. Reports feeling very anxious. Tearful in triage  Wears 2L O2 chronically  History sciatica and peripheral neuropathy

## 2023-05-04 NOTE — Discharge Instructions (Signed)
 Please seek medical attention for any high fevers, chest pain, shortness of breath, change in behavior, persistent vomiting, bloody stool or any other new or concerning symptoms.

## 2023-05-04 NOTE — ED Provider Notes (Signed)
 [facility] Provider Note    Event Date/Time   First MD Initiated Contact with Patient 05/04/23 1736     (approximate)   History   Anxiety and Weakness   HPI  Mario [name] is a 78 y.o. male who presents to the emerged [place] department today because of concerns for anxiety as well as weakness.  Patient has been feeling sick and weak for the past few days.  This has included some nausea.  Because of this he has not been eating or drinking as much is normal.  He also has been getting bad anxiety.  Seems to be worse at night.  The patient talked to his doctor who prescribed a medication although they did not get a chance to pick that up today.     Physical Exam   Triage Vital Signs: ED Triage Vitals  Encounter Vitals Group     BP 05/04/23 1133 (!) 143/90     Systolic BP Percentile --      Diastolic BP Percentile --      Pulse Rate 05/04/23 1133 (!) 102     Resp 05/04/23 1133 20     Temp 05/04/23 1133 98.3 F (36.8 C)     Temp Source 05/04/23 1133 Oral     SpO2 05/04/23 1133 98 %     Weight 05/04/23 1131 207 lb (93.9 kg)     Height 05/04/23 1131 5' 10 (1.778 m)     Head Circumference --      Peak Flow --      Pain Score 05/04/23 1130 6     Pain Loc --      Pain Education --      Exclude from Growth Chart --     Most recent vital signs: Vitals:   05/04/23 1133 05/04/23 1622  BP: (!) 143/90 (!) 138/95  Pulse: (!) 102 (!) 102  Resp: 20 19  Temp: 98.3 F (36.8 C) 98 F (36.7 C)  SpO2: 98% 100%   General: Awake, alert, oriented. CV:  Good peripheral perfusion. Regular rate and rhythm. Resp:  Normal effort. Lungs clear. Abd:  No distention.    ED Results / Procedures / Treatments   Labs (all labs ordered are listed, but only abnormal results are displayed) Labs Reviewed  CBC WITH DIFFERENTIAL/PLATELET - Abnormal; Notable for the following components:      Result Value   Hemoglobin 12.7 (*)    RDW 16.2 (*)    Neutro Abs 7.9 (*)    All  other components within normal limits  COMPREHENSIVE METABOLIC PANEL - Abnormal; Notable for the following components:   Potassium 3.4 (*)    Chloride 95 (*)    Glucose, Bld 342 (*)    BUN 25 (*)    Creatinine, Ser 1.45 (*)    Calcium 8.8 (*)    GFR, Estimated 50 (*)    All other components within normal limits  RESP PANEL BY RT-PCR (RSV, FLU A&B, COVID)  [id number]  URINALYSIS, ROUTINE W REFLEX MICROSCOPIC  TROPONIN I (HIGH SENSITIVITY)  TROPONIN I (HIGH SENSITIVITY)     EKG  I, Guadalupe Eagles, attending physician, personally viewed and interpreted this EKG  EKG Time: 1143 Rate: 103 Rhythm: sinus tachycardia Axis: left axis deviation Intervals: qtc 453 QRS: narrow, q waves v1 ST changes: no st elevation Impression: abnormal ekg   RADIOLOGY I independently interpreted and visualized the CT head. My interpretation: No bleed Radiology interpretation:  IMPRESSION:  No acute intracranial  abnormalities.      PROCEDURES:  Critical Care performed: No   MEDICATIONS ORDERED IN ED: Medications - No data to display   IMPRESSION / MDM / ASSESSMENT AND PLAN / ED COURSE  I reviewed the triage vital signs and the nursing notes.                              Differential diagnosis includes, but is not limited to, dehydration, infection, anxiety  Patient's presentation is most consistent with acute presentation with potential threat to life or bodily function.   The patient is on the cardiac monitor to evaluate for evidence of arrhythmia and/or significant heart rate changes.  Patient presented to the emergency department today because of concerns for anxiety and weakness.  On exam patient does appear somewhat anxious.  Blood work shows elevated glucose and creatinine.  I do think patient is partly dehydrated.  Will give IV fluids.  Will give antianxiolytic.  Patient did feel improvement after fluids and medication. At this time I think it is reasonable for patient to be  discharged.       FINAL CLINICAL IMPRESSION(S) / ED DIAGNOSES   Final diagnoses:  Anxiety  Dehydration     Note:  This document was prepared using Dragon voice recognition software and may include unintentional dictation errors.    Floy Roberts, MD 05/04/23 2022

## 2023-05-04 NOTE — ED Notes (Signed)
Patient given snacks.

## 2023-05-04 NOTE — ED Provider Triage Note (Signed)
 Emergency Medicine Provider Triage Evaluation Note  Jobie Popp , a 78 y.o. male  was evaluated in triage.  Pt complains of nausea and gagging without producing any vomit for the past several days. He is also becoming very weak, anxious and has been emotional and starts crying for no apparent reason x 3 days.  Physical Exam  BP (!) 143/90 (BP Location: Left Arm)   Pulse (!) 102   Temp 98.3 F (36.8 C) (Oral)   Resp 20   Ht 5' 10 (1.778 m)   Wt 93.9 kg   SpO2 98%   BMI 29.70 kg/m  Gen:   Awake, no distress   Resp:  Normal effort  MSK:   Moves extremities without difficulty  Other:    Medical Decision Making  Medically screening exam initiated at 11:35 AM.  Appropriate orders placed.  Kentrell Hallahan was informed that the remainder of the evaluation will be completed by another provider, this initial triage assessment does not replace that evaluation, and the importance of remaining in the ED until their evaluation is complete.    Herlinda Kirk NOVAK, FNP 05/04/23 1315

## 2023-05-04 NOTE — Unmapped (Signed)
Left voicemail that he had medication at the pharmacy. Thanks

## 2023-05-04 NOTE — Unmapped (Signed)
Not a candidate for Atarax of propranolol. Ativan PRN for panic attacks, not to be taken with tramadol for pain. Will need follow up to have future refills and controlled substance agreement.

## 2023-05-07 NOTE — Unmapped (Signed)
Received a report from [place] stating patient had a abnormal PAD test, see scanned documentation. Also see CTA cardiac testing and patient has an appt 07/09/23 with Dr Camillo Flaming, please advise on action needed if any, thanks

## 2023-05-07 NOTE — Unmapped (Signed)
Patient's partner, Juliet Rude, at [phone] aware I will contact her once determination is made if patient can receive iron infusion at [facility] on 1.15.2025 after his already scheduled 8 AM appointment; she is aware message was sent to our internal team to determine time if so and notification will be made to ensure agreement. She was appreciative of our communications.

## 2023-05-07 NOTE — Unmapped (Signed)
Hello,    We received a call regarding patient Paul Velez. Caller is requesting the following return appointments: infusion. Scheduling an iron infusion for 05/16/23 in [facility], the patient will be there on that day.     We are unable to schedule this appointment because we do not schedule infusion visits. Please reach out to the patient to provide further assistance.    [facility] can be reached interanally at [phone] or [phone] opt [phone]    Thank you,   Durward Fortes  [facility]  [phone]

## 2023-05-08 NOTE — Unmapped (Signed)
Called and left message to schedule

## 2023-05-08 NOTE — Unmapped (Signed)
This would be one to put on Paul Velez's schedule but after 1/15 on 1/22 as he has appts already scheduled 1/15

## 2023-05-08 NOTE — Unmapped (Signed)
Corresponded with Iantha Fallen, RN via internal chat and was able to coordinate with patient's partner, Juliet Rude, that Thursday, 1.9.2025 at 9 AM works best for patient/partner and [employer]; she has been assisting patient with organizing and helping to ensure he is aware and at each scheduled appointment, but is very difficult for her, as he has appointments within [place]/[facility] systems and she does not have access to all of his MyChart accounts because he has forgotten/lost username and/or passwords to some or all of them. Assisted her with contact information for both [facility] and [employer] [facility] and instructed her to reach out as that will help alleviate some cumbersome duties as she is still trying to work part-time; she also helps manage his medications and is finding he is forgetful and missing doses because she cannot be with him 24-hrs a day and cannot afford long-term care in the home. We discussed home health vs hospice vs an in-home palliative consult, and due to her location and [place] lack of availability, especially in that service area, Civil engineer, contracting in [place] was discussed and she is in agreement with an in-home palliative consult to discuss disease/medication management and caregiver resources and support as she is aware that agency provides a Child psychotherapist and a provider for the [facility] consult and they are in the process, if not already, serving the community with home health services again so she is aware they can transition to hospice if/when the time comes and he and she are ready to do so. She was appreciative of the call, time and information.     I have sent to several of you as this is a Criselda Peaches patient, scheduled to see Dr. Camillo Flaming 3.10.2025 to transition and also a visit with Rashida, FNP 1.22.2025 per the recommendation of Dr. Lewis Moccasin. Please place an in-home palliative consult to [facility] if in agreement. Thanks so much!

## 2023-05-08 NOTE — Unmapped (Signed)
Scheduled

## 2023-05-09 DIAGNOSIS — D508 Other iron deficiency anemias: Principal | ICD-10-CM

## 2023-05-09 NOTE — Unmapped (Signed)
Received a call from [facility] that they are in need of on order for patient to receive Iron Infusion tomorrow. Patient is already scheduled but no order was placed by Dr. Criselda Peaches. (See 04/10/2023 lab results were message to patient from Dr. Criselda Peaches mentioned the infusion).

## 2023-05-09 NOTE — Unmapped (Signed)
Addended by: Idalia Needle D on: 05/09/2023 03:32 PM     Modules accepted: Orders

## 2023-05-09 NOTE — Unmapped (Addendum)
Paul Sox, MD  [facility] Medicine Clinical [phone] minutes ago (2:52 PM)     I think it's in now. Please check with them, I entered the therapy plan so I want to make sure it went correctly. Thx.

## 2023-05-09 NOTE — Unmapped (Addendum)
Contacted [facility]. Spoke with nurse and they confirmed that order has been received. No further action needed.

## 2023-05-09 NOTE — Unmapped (Signed)
External palliative care referral placed as requested.

## 2023-05-10 NOTE — Unmapped (Signed)
Called to discuss infusion appointment that was scheduled for tomorrow 1.9.25. Patients insurance has not been updated in system so there has been no ability to get authorization prior to tomorrow's visit. Left message for patient to call back and reschedule.

## 2023-05-14 DIAGNOSIS — G4733 Obstructive sleep apnea (adult) (pediatric): Secondary | ICD-10-CM | POA: Diagnosis not present

## 2023-05-14 NOTE — Unmapped (Signed)
LVM for the patient saying that during his last visit with Dr Sandie Ano he ordered a consult for ENT and neurology.  Neither consult is seen in [facility] chart.  If they were done elsewhere, please call office so the documentation can obtained for the visit scheduled for tomorrow.  If they have not been done, please call [phone] and reschedule your visit as they are necessary for the return appt.

## 2023-05-14 NOTE — Unmapped (Unsigned)
VISIT DATE: 05/15/2023     CHIEF COMPLAINT: No chief complaint on file.       HPI: This is a pleasant 78 y.o. 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    Patient has had previous referral to physical therapy, LESIs, and medical therapy (Gabapentin, Tramadol, and Celebrex) with no long-lasting relief. At his last visit, we recommended HEP due to his deconditioning and a referral to neurology. He is seeing neurology on 05/16/2023.     IMAGING:  No new imaging to review.     OTHER STUDIES:    No results found for this or any previous visit.     No results found for this or any previous visit.         ROS - as stated above in HPI & otherwise negative    PHYSICAL EXAM:  There were no vitals taken for this visit. There is no height or weight on file to calculate BMI.   AOx4. NAD. Presents in wheelchair and with oxygen machine. Accompanied by son.     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       Mattie Marlin, MD, MBA, MS, FAANS  Minimally Invasive and Complex Spine Surgery  Clinical Associate Professor of Neurosurgery  [facility]    ---    Documentation assistance was provided by Marga Melnick Scribe, for [place]erag D. Sandie Ano, MD.

## 2023-05-15 DIAGNOSIS — J449 Chronic obstructive pulmonary disease, unspecified: Secondary | ICD-10-CM | POA: Diagnosis not present

## 2023-05-16 ENCOUNTER — Ambulatory Visit: Admit: 2023-05-16 | Discharge: 2023-05-17 | Payer: MEDICARE | Attending: Neurology | Primary: Neurology

## 2023-05-16 DIAGNOSIS — Z114 Encounter for screening for human immunodeficiency virus [HIV]: Principal | ICD-10-CM

## 2023-05-16 DIAGNOSIS — Z1159 Encounter for screening for other viral diseases: Principal | ICD-10-CM

## 2023-05-16 DIAGNOSIS — G959 Disease of spinal cord, unspecified: Principal | ICD-10-CM

## 2023-05-16 DIAGNOSIS — G373 Acute transverse myelitis in demyelinating disease of central nervous system: Principal | ICD-10-CM

## 2023-05-16 LAB — VITAMIN B12: VITAMIN B-12: 632 pg/mL (ref 211–911)

## 2023-05-16 LAB — RHEUMATOID FACTOR, QUANT: RHEUMATOID FACTOR: 5.8 [IU]/mL (ref <14.0)

## 2023-05-16 NOTE — Unmapped (Signed)
Thank you for visiting [facility] today.    Please find our contact information below.      In case of:  a suspected relapse (new symptoms or worsening existing symptoms, lasting for >24h)  OR  a need for a regular appointment  OR  you would have other questions:    Please contact:  [facility]  [facility]  [address] [place], [place] [address]  Fax: [phone]    For clinical and scheduling questions, please contact:  RN, phone: [phone]     If you need assistance from our social worker, please contact:   Riki Altes, phone: [phone]    If you need to speak with the pharmacist, please contact:  Worthy Flank, PharmD,CPP; phone: [phone]    If you would have questions outside regular office hours, please call [facility] hospital operator:   Phone: [phone], and ask for a neurology resident on-call.             Sarita Bottom, MD  Clinical Associate Professor of Neurology  [facility], Department of Neurology  Multiple Sclerosis/Neuroimmunology Division  [facility]    [facility] is a specialty clinic and there is a need for you to have a  primary care provider  who will take care of your non-neurological health.   ....................................................................................................    Our recommendations from today's visit:     Labs today  After seeing labs we will decide on the need for spinal tap  Follow up timing will be decided on after  we decide on the spinal tap  No treatment changes are made today  ........................................................................................Marland Kitchen    It was a pleasure to see you today!    We are grateful for the opportunity to contribute to your care and are looking forward to seeing you again.  Please if you can take time to complete a post-visit survey so we can identify opportunities for improvement. We also like to know when we do things well so we can continue providing great service.

## 2023-05-16 NOTE — Unmapped (Signed)
[employer] [facility]  Multiple Sclerosis/Neuroimmunology Division    [facility], [facility]  [address], [place], [place] [address]     Sarita Bottom, MD  Associate Professor of Neurology    DATE OF VISIT: 05/16/2023    Re:  [facility]  [address]  [place] [address]  MRN: [id number]  DOB: [birth year]      Visit: First Visit      REASON FOR VISIT: Mr. Rayce Chung, a 78 y.o. 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 This is the first evaluation of  Mr. Ziven Benko at the [facility], Multiple Sclerosis/Neuroimmunology Division.    Assessment:     1. I took a detailed history of the present illness fromMr. Truitt Leep , details on past medical history, family history and social history.  2. I personally reviewed  patient's prior medical records, radiology reports, and laboratory work.    3. I personally reviewed the patient???s prior MRI images  and have discussed  MRI findings with the patient.   4.  I performed medication reconciliation and neurological examination   5  Mr. Truitt Leep completed the PHQ-9 depression questionnaire  6. The differential diagnosis and the plan for the diagnostic work-up were discussed in details with the patient. Mr. Fabain Machain agreed with the recommended diagnostic plan  7. Potential treatment options have been  discussed with Mr. Ashlyn Papp, who agreed with the recommended treatment plan.                                                                                                                                                    ?? Thoracic myelitis/myelopathy:  Mr. Yates Naz, is a 78 y.o. African American right handed male with a history of  has a past medical history of Allergic, CHF (congestive heart failure) (CMS-HCC), COPD (chronic obstructive pulmonary disease) (CMS-HCC), Diabetes mellitus, type 2 (CMS-HCC) (10/18/2016), Diabetes type 2, controlled (CMS-HCC), HLD (hyperlipidemia), HTN (hypertension), Neuropathy, OA (osteoarthritis) of knee, PAF (paroxysmal atrial fibrillation) (CMS-HCC), and Sarcoidosis. 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    -The patient reports slowly progressive bilateral leg weakness and sensory changes in the lower extremities that have been likely lasting for years.  Is difficult to estimate when exactly this started since the patient functionality has been confounded by COPD, CHF, and possibly diabetic neuropathy.  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  Lumbar MRI on 09/13/2022 without contrast showed multilevel degenerative changes mostly at disc disease  L3-L4, at L4-L5 and L5-S1.  At theCervical and thoracic spine MRI study without contrast performed on 11/06/2022 which showed degenerative disc disease in the cervical and thoracic spine and no cervical spinal cord lesions, but also showed cord signal abnormality at T6 with subtle cord enlargement.  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   The patient has been seen by neurosurgery -Seth Bake, MD who referred the patient to neurology.  The differential of thoracic myelopathy is broad-may include nutritional myelopathy, transverse myelitis, or spinal cord tumor.  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  There is a rare possibility that some spinal cord tumors also do not enhance on the postcontrast study. I ordered laboratory test to evaluate for nutritional and inflammatory myelopathies, and then make a decision on the spinal tap after reviewing those results.  Please see the plan below.  .  Plan:     -Laboratory evaluation for possible causes of myelopathy: - Vitamin B12 Level; - Methylmalonic Acid, Quantitative;  Copper, serum; - Zinc Level, Serum; - Syphilis Screen; - Myelopathy, Autoimmune/Paraneoplastic Evaluation, Serum; - Quantiferon TB Gold Plus; - Anti-Nuclear Antibody (ANA); - Anti-neutrophilic Cytoplasmic Antibody (ANCA); - Extractable Nuclear Antigen (ENA); - Rheumatoid Factor, Quantitative; - Vitamin E; - Angiotensin Converting Enzyme; - Soluble IL-2 Receptor; - Lysozyme; - Hepatitis C Antibody; - Lyme Disease Serology; - Hepatitis B Core Antibody, total; - HTLV I/II Antibody; - HIV Antigen/Antibody Combo;     After reviewing the laboratory results we will decide on the need for spinal tap  Follow up TBD, will depend on decision on spinal tap and its timing.   No treatment changes are made today       Subjective:     HISTORY OF PRESENT ILLNESS:  Mr. Zayaan Sanghera, is a 78 y.o. African American right handed male with a history of  has a past medical history of Allergic, CHF (congestive heart failure) (CMS-HCC), COPD (chronic obstructive pulmonary disease) (CMS-HCC), Diabetes mellitus, type 2 (CMS-HCC) (10/18/2016), Diabetes type 2, controlled (CMS-HCC), HLD (hyperlipidemia), HTN (hypertension), Neuropathy, OA (osteoarthritis) of knee, PAF (paroxysmal atrial fibrillation) (CMS-HCC), and Sarcoidosis. 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    Sarcoidosis Dx several years ago    10 years on oxygen for COPD.  Pulmonology provider:  Dr.  Meredeth Ide at [facility].     Current symptoms (chief complaints):  Vision/double vision: Reports vision problems, patient thinks this is due to his diabetes, patient problem started years ago, never had double vision. After cataract removal he sees well (bilateral cataract removal)  Speech, swallowing problems: Hoarse voice also has oral thrush.Some swallowing problems with solid food, not liquids, in the last 8 months.   Weakness: started 10 years ago (around the time when he started oxygen). He does not remember when was the last time he was able to climb stair, now has chair lift at home  Fatigue: he has difficulties to finish daily activities, he feels out of breath, overall lack of energy/he has CPAP that he stopped using but now plans to get back to it. He reports heat sensitivity in the last year.Takes gabapentin 1200 mg TID that helps.  Tingling/numbness/pain: started with his diabetes , tingling  and numbness in his feet.  It feels like itching and tingling. Does not report pain.   Balance/coordination problems: some balance problems  Bowel/bladder control problems: sometimes BM once a week. In the last 6 weeks he is more constipated. Bladder: urgency, he does not think he always empties completely  Memory, mood: denies cognitive problems  Gait: the longest he can walk wo stopping to rest is from his porch to the car (about 3 car lengths  per patient)   Falls:yes  Headaches: none reported  Seizures: none reported      Children are healthy    In the last year he lost much weight and muscle mass while being on Ozempic. So he stopped taking Ozempic. He has been on Ozempic for a couple of years. Not losing more weight since stooping Ozempic per patient.       ............................................................................................................................................Marland Kitchen  DIAGNOSTIC STUDIES / REVIEW OF RECORDS:    Prior medical records: Reviewed personally, please see assessment.    .............................................................................................................................................        Past Medical History:  Past Medical History:   Diagnosis Date    Allergic     CHF (congestive heart failure) (CMS-HCC)     COPD (chronic obstructive pulmonary disease) (CMS-HCC)     Diabetes mellitus, type 2 (CMS-HCC) 10/18/2016    Diabetes mellitus, type 2 - resolved from PL since DM with complications is also on PL and supported by documentation.     SDC      Diabetes type 2, controlled (CMS-HCC)     HLD (hyperlipidemia)     HTN (hypertension)     Neuropathy     OA (osteoarthritis) of knee     PAF (paroxysmal atrial fibrillation) (CMS-HCC)     Sarcoidosis        ALLERGIES:    Allergies   Allergen Reactions    Rosuvastatin Anaphylaxis     felt bad    Ace Inhibitors Anxiety    Lisinopril Other (See Comments)     Other reaction(s): Mental Status Change, Cough    Brinzolamide-Brimonidine Itching     And burning  And burning         CURRENT MEDICATIONS:    Current Outpatient Medications   Medication Sig Dispense Refill    albuterol sulfate 90 mcg/actuation AePB Inhale 2 puffs every four (4) hours as needed.      amlodipine (NORVASC) 2.5 MG tablet Take 1 tablet (2.5 mg total) by mouth daily. 90 tablet 2    atorvastatin (LIPITOR) 20 MG tablet Take 1 tablet (20 mg total) by mouth daily. 90 tablet 2    desvenlafaxine succinate (PRISTIQ) 100 MG 24 hr tablet Take 1 tablet (100 mg total) by mouth daily. 90 tablet 3    empty container Misc Use as directed to dispose of [employer]. 1 each 2    fluconazole (DIFLUCAN) 100 MG tablet Take 1 tablet (100 mg total) by mouth daily.      furosemide (LASIX) 80 MG tablet Take 1 tablet (80 mg total) by mouth daily. 90 tablet 2    gabapentin (NEURONTIN) 600 MG tablet Take 2 tablets (1,200 mg total) by mouth Three (3) times a day. 540 tablet 3    hydroCHLOROthiazide 12.5 MG tablet TAKE 1 TABLET BY MOUTH DAILY 100 tablet 1 JARDIANCE 10 mg tablet TAKE 1 TABLET BY MOUTH DAILY 30 tablet 10    LORazepam (ATIVAN) 0.5 MG tablet Take 1 tablet (0.5 mg total) by mouth daily as needed for anxiety. 10 tablet  0    losartan (COZAAR) 50 MG tablet Take 1 tablet (50 mg total) by mouth daily.      metFORMIN (GLUCOPHAGE) 1000 MG tablet Take 1 tablet (1,000 mg total) by mouth in the morning and 1 tablet (1,000 mg total) in the evening. Take with meals. 180 tablet 1    ONETOUCH VERIO TEST STRIPS Strp Use to check blood sugar twice daily as directed. 100 strip 3    risankizumab-rzaa (SKYRIZI) 150 mg/mL PnIj Inject the contents of 1 pen (150mg ) under the skin every 12 weeks as maintenance. 1 mL 0    ROCKLATAN 0.02-0.005 % Drop Administer 1 drop to both eyes nightly.      sotalol (BETAPACE) 80 MG tablet Take 1 tablet (80 mg total) by mouth two (2) times a day. 180 tablet 3    tizanidine (ZANAFLEX) 2 MG tablet Take 1 tablet (2 mg total) by mouth nightly as needed (muscle cramps). 30 tablet 1    traMADol (ULTRAM) 50 mg tablet Take 1 tablet (50 mg total) by mouth every twelve (12) hours as needed for severe pain. 20 tablet 0    TRELEGY ELLIPTA 100-62.5-25 mcg inhaler Inhale 1 puff daily.      triamcinolone (KENALOG) 0.1 % cream Apply topically two (2) times a day. For your legs. 454 g 2    umeclidinium (INCRUSE ELLIPTA) 62.5 mcg/actuation inhaler Inhale 1 puff daily.      dexAMETHasone (DECADRON) 2 MG tablet Take 1 tablet (2 mg total) by mouth daily. (Patient not taking: Reported on 05/16/2023)       No current facility-administered medications for this visit.           Past Surgical History:    Past Surgical History:   Procedure Laterality Date    CATARACT EXTRACTION Bilateral     FINGER SURGERY Right     IV finger    KNEE SURGERY Right        Social History:    Social History     Socioeconomic History    Marital status: Widowed     Spouse name: None    Number of children: 4    Years of education: None    Highest education level: None   Tobacco Use    Smoking status: Former     Current packs/day: 0.00     Types: Cigarettes     Quit date: 1971     Years since quitting: 54.0    Smokeless tobacco: Never   Substance and Sexual Activity    Alcohol use: Not Currently    Drug use: Never   Other Topics Concern    Do you use sunscreen? Yes    Tanning bed use? No    Are you easily burned? No    Excessive sun exposure? No    Blistering sunburns? No     Social Drivers of Psychologist, prison and probation services Strain: Low Risk  (02/13/2017)    Received [facility], [facility]    Overall Financial Resource Strain (CARDIA)     Difficulty of Paying Living Expenses: Not hard at all   Food Insecurity: No Food Insecurity (04/21/2022)    Hunger Vital Sign     Worried About Running Out of Food in the Last Year: Never true     Ran Out of Food in the Last Year: Never true   Transportation Needs: No Transportation Needs (02/13/2017)    Received from [facility] [place]  [facility]    PRAPARE - Therapist, art (Medical): No     Lack of Transportation (Non-Medical): No       Family History:    Family History   Problem Relation Age of Onset    Stroke Mother     Other (brain aneurysm) Mother     Diabetes Father     Sarcoidosis Sister     Melanoma Neg Hx     Basal cell carcinoma Neg Hx     Squamous cell carcinoma Neg Hx         Review of Systems:  A 10-systems review was performed and, unless otherwise noted, declared negative by patient.    Objective:     Physical Exam:  Blood pressure 113/76, pulse 74, temperature 36.7 ??C (98.1 ??F), temperature source Temporal, resp. rate 20, height 175.3 cm (5' 9), weight 93.9 kg (207 lb).   General Appearance: in no acute distress. Normal skin color, afebrile.  Heart and lungs: Regular heart rate . On oxygen support.  Abdomen: Soft, non-tender. Peripheral pulses palpable.       NEUROLOGICAL EXAMINATION:     General:  Alert and oriented to person, place, time and situation. Language and spontaneous speech normal, no dysarthria or aphasia.  naming/fluency/repetition intact.  Fund of knowledge normal.  Following lateralizing commands across midline   PHQ9: 18, no SI, tearful in the office when talking about what he was previously able to do and could not do now    Cranial Nerves:     II, III- Pupils are equal and reactive to light b/l (direct and consensual reactions). Visual Acuity: VAR 20/40, VAL 20/30-2, color vision is good. Glasses: yes. No visual field defect. Fundoscopy: clear optic disc margins, mild bilateral optic disc pallor.   III, IV, VI- extra ocular movements are intact, No ptosis, no diplopia, no nystagmus.  V- sensation of the face intact b/l.   VII- face symmetrical, no facial droop, normal facial movements with smile/grimace  VIII- Hearing grossly intact. Weber test: sound is symmetrical with no lateralization.  IX and X- symmetric palate contraction,  no dysarthria, no dysphagia.  XI- Full shoulder shrug bilaterally; no wasting, normal tone and strength of sternocleidomastoid muscles bilaterally.  XII- No tongue atrophy, no tongue fasciculations; tongue protrudes midline, full range of movements of the tongue.    Neck flexion normal.    Motor Exam:     Normal bulk. Fasciculations not observed.     Muscle strength:    Muscles UEs  LEs    R L  R L   Deltoids 5/5 5/5 Hip flexors  4/5 4/5   Biceps 5/5 5/5 Hip extensors 5/5 5/5   Triceps 5/5 5/5 Knee flexors 5-/5 5-/5   Hand grip 5/5 5/5 Knee extensors 5/5 5/5   Wrist flexors 5/5 5/5 Foot dorsal flexors 5/5 5/5   Wrist extensors 5/5 5/5 Foot plantar flexors 5/5 5/5   Finger flexors 5/5 5/5      Finger extensors 5/5 5/5             Reflexes R L   Biceps +2 +2   Brachioradialis  +2 +2   Triceps +2 +2   Patella +1 +2   Achilles +1 +1     Normal tone b/l. Negative Babinski sign bilaterally. Bilaterally positive adductor and suprapatellar reflex    Sensory system:  Superficial light touch sensation:  hypesthesia on both feet for light touch  Vibration sense:  lost on the right up to the knee, where decerased, lost on left leg up to the ankle, where decerased  Position sense: wnl    (WNL= within normal limits; UE= upper extremities; LE= lower extremities;       R= right, L= left).    Cerebellar/Coordination:  Nio UE ataxia or intention tremor, no LE ataxia, Romberg positive, does not perform tandem gait    Gait: with bilateral assistance on short distances, patient presents in a wheelchair..    .........................................................................................................................................Marland Kitchen    VISIT SUMMARY:  Mr. Latisha Vanlenten, a 78 y.o. 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  Mr. Karmello Silcox voiced a complete understanding of the diagnostic and treatment plan as detailed above. All questions were answered.     I personally spent 65 minutes face-to-face and non-face-to-face in the care of this patient, which includes all pre, intra, and post visit time on the date of service.  All documented time was specific to the E/M visit and does not include any procedures that may have been performed.      Thank you for the opportunity to contribute to the care of Mr. Shadrack Carabello.

## 2023-05-17 LAB — SYPHILIS SCREEN: SYPHILIS RPR SCREEN: NONREACTIVE

## 2023-05-17 NOTE — Progress Notes (Signed)
Viral illness - concern for covid/flu

## 2023-05-18 LAB — ZINC: ZINC: 69 ug/dL

## 2023-05-18 LAB — COPPER, SERUM: COPPER: 107 ug/dL

## 2023-05-18 LAB — HTLV I/II ANTIBODY: HTLV I/II ANTIBODIES: NEGATIVE

## 2023-05-18 LAB — HEPATITIS C ANTIBODY: HEPATITIS C ANTIBODY: NONREACTIVE

## 2023-05-18 LAB — ANA: ANTINUCLEAR ANTIBODIES (ANA): POSITIVE — AB

## 2023-05-18 LAB — HIV ANTIGEN/ANTIBODY COMBO: HIV ANTIGEN/ANTIBODY COMBO: NONREACTIVE

## 2023-05-18 LAB — HEPATITIS B CORE ANTIBODY, TOTAL: HEPATITIS B CORE TOTAL ANTIBODY: NONREACTIVE

## 2023-05-19 LAB — ANTI-NEUTROPHILIC CYTOPLASMIC ANTIBODY
ANCA IFA: NEGATIVE
MPO-ELISA: NEGATIVE
MPO-QUANT: 0.7 U/mL (ref <21.0)
PR3 ELISA: NEGATIVE
PR3-QUANT: 0.5 U/mL (ref <21.0)

## 2023-05-19 LAB — ANGIOTENSIN CONVERTING ENZYME: ANGIOTENSIN CONVERTING ENZYME: 31 U/L

## 2023-05-21 LAB — QUANTIFERON TB GOLD PLUS
QUANTIFERON ANTIGEN 1 MINUS NIL: -0.01 [IU]/mL
QUANTIFERON ANTIGEN 2 MINUS NIL: 0 [IU]/mL
QUANTIFERON MITOGEN: 2.96 [IU]/mL
QUANTIFERON TB GOLD PLUS: NEGATIVE
QUANTIFERON TB NIL VALUE: 0.02 [IU]/mL

## 2023-05-21 LAB — TB MITOGEN: TB MITOGEN VALUE: 2.98

## 2023-05-21 LAB — TB NIL: TB NIL VALUE: 0.02

## 2023-05-21 LAB — LYSOZYME: LYSOZYME (MURAMIDASE): 13.3 ug/mL — ABNORMAL HIGH

## 2023-05-21 LAB — SOLUBLE IL-2 RECEPTOR: IL-2 RECEPTOR, SOLUBLE: 1108.3 pg/mL — ABNORMAL HIGH

## 2023-05-21 LAB — TB AG1: TB AG1 VALUE: 0.01

## 2023-05-21 LAB — TB AG2: TB AG2 VALUE: 0.02

## 2023-05-22 LAB — METHYLMALONIC ACID, SERUM: METHYLMALONIC ACID: 0.23 nmol/mL

## 2023-05-22 LAB — EXTRACTABLE NUCLEAR ANTIGEN: ENA SCREEN: 0.2 ENA Units (ref <0.70)

## 2023-05-22 LAB — LYME DISEASE SEROLOGY: LYME ANTIBODY: NEGATIVE

## 2023-05-24 NOTE — Unmapped (Addendum)
I attempted to call patient and sent a text message. Change 1/28 appointment with Dr. Sandie Ano to a telephone visit.       ----- Message from Angelena Form, RN sent at 05/22/2023  2:31 PM EST -----  Regarding: change appt type  Please call this patient and change to phone visit per Dr Sandie Ano.  In person not necessary.

## 2023-05-29 ENCOUNTER — Ambulatory Visit: Admit: 2023-05-29 | Discharge: 2023-05-30 | Payer: MEDICARE

## 2023-05-29 DIAGNOSIS — M549 Dorsalgia, unspecified: Principal | ICD-10-CM

## 2023-05-29 NOTE — Unmapped (Signed)
VISIT DATE: 05/29/2023     CHIEF COMPLAINT:   Chief Complaint   Patient presents with    Follow-up     Follow up lesion on back        HPI: This is a pleasant 78 y.o. male with a past medical history of HLD, DMT2, CHF, COPD, a-fib, sarcoidosis, and HTN.  He presents today in follow-up as he has a number of symptoms of back pain as well as lower extremity symptoms.  He is being worked up by neurology at this point for the abnormality at the T6 level.  A number of blood work was sent for this evaluation.  He is certainly not interested in surgical intervention.  Since I last saw him he states that he feels better actually when he is more active.  He was open to physical therapy today.    IMAGING: I reviewed his lumbar MRI.  Lumbar MRI demonstrates some spondylosis but nothing I would describe is severe stenosis.    ROS - as stated above in HPI & otherwise negative    PHYSICAL EXAM:  Temp 36.7 ??C (98 ??F) (Temporal)  - Resp 18  - Ht 177.8 cm (5' 10)  - Wt 95.7 kg (211 lb)  - BMI 30.28 kg/m??  Body mass index is 30.28 kg/m??.   AOx4. NAD. Presents in wheelchair and with oxygen machine.  Motor is grossly intact.    ASSESSMENT & PLAN:  This is a pleasant 78 year old gentleman back pain as well as some lower extremity symptoms.  Differential includes radiculopathy but also includes neuropathy.  I discussed that I had a percent agree with him that he should be more active and that physical therapy is a good pathway for him to be more active as this will likely help his symptoms as long as he has a robust home exercise program.  I do not feel that he would be a great surgical candidate.  I discussed that it may be reasonable to have him get plugged back in with pain management to see if there is anything that can be done to maximize that pathway.  Otherwise I would encourage him to follow-up with neurology and then discuss appropriate neuropathic agents with his neurologist and/or PCP.  PRN follow-up.    Mattie Marlin, MD, MBA, MS, FAANS  Minimally Invasive and Complex Spine Surgery  Clinical Associate Professor of Neurosurgery  [facility]    ---    Documentation assistance was provided by Marga Melnick Scribe, for Hazyl Marseille D. Sandie Ano, MD.

## 2023-06-04 NOTE — Unmapped (Signed)
Specialty Medication(s): Cristy Folks    Mr.Cyphers has been dis-enrolled from the [facility] specialty pharmacy services due to  no active rx .    Additional information provided to the patient: pharmacy informed patient back in the fall that visit needed for more refills. 1/30 visit was cancelled and rescheduled. Will remove from pharmacy tracking for now - if/when new order received, will re-enroll into pharmacy services.     Jr Milliron [facility] Specialty Pharmacist

## 2023-06-06 DIAGNOSIS — G4733 Obstructive sleep apnea (adult) (pediatric): Secondary | ICD-10-CM | POA: Diagnosis not present

## 2023-06-06 DIAGNOSIS — J449 Chronic obstructive pulmonary disease, unspecified: Secondary | ICD-10-CM | POA: Diagnosis not present

## 2023-06-06 DIAGNOSIS — D869 Sarcoidosis, unspecified: Secondary | ICD-10-CM | POA: Diagnosis not present

## 2023-06-06 DIAGNOSIS — R1310 Dysphagia, unspecified: Secondary | ICD-10-CM | POA: Diagnosis not present

## 2023-06-06 DIAGNOSIS — R0602 Shortness of breath: Secondary | ICD-10-CM | POA: Diagnosis not present

## 2023-06-15 DIAGNOSIS — J449 Chronic obstructive pulmonary disease, unspecified: Secondary | ICD-10-CM | POA: Diagnosis not present

## 2023-06-26 ENCOUNTER — Ambulatory Visit: Admit: 2023-06-26 | Discharge: 2023-06-27 | Payer: MEDICARE

## 2023-06-26 DIAGNOSIS — I480 Paroxysmal atrial fibrillation: Principal | ICD-10-CM

## 2023-06-26 DIAGNOSIS — E1169 Type 2 diabetes mellitus with other specified complication: Principal | ICD-10-CM

## 2023-06-26 DIAGNOSIS — E119 Type 2 diabetes mellitus without complications: Secondary | ICD-10-CM | POA: Diagnosis not present

## 2023-06-27 DIAGNOSIS — H401123 Primary open-angle glaucoma, left eye, severe stage: Secondary | ICD-10-CM | POA: Diagnosis not present

## 2023-06-27 DIAGNOSIS — H401111 Primary open-angle glaucoma, right eye, mild stage: Secondary | ICD-10-CM | POA: Diagnosis not present

## 2023-06-27 DIAGNOSIS — Z961 Presence of intraocular lens: Secondary | ICD-10-CM | POA: Diagnosis not present

## 2023-06-27 DIAGNOSIS — E119 Type 2 diabetes mellitus without complications: Secondary | ICD-10-CM | POA: Diagnosis not present

## 2023-07-02 DIAGNOSIS — J449 Chronic obstructive pulmonary disease, unspecified: Secondary | ICD-10-CM | POA: Diagnosis not present

## 2023-07-02 DIAGNOSIS — I152 Hypertension secondary to endocrine disorders: Secondary | ICD-10-CM | POA: Diagnosis not present

## 2023-07-02 DIAGNOSIS — I429 Cardiomyopathy, unspecified: Secondary | ICD-10-CM | POA: Diagnosis not present

## 2023-07-02 DIAGNOSIS — I5022 Chronic systolic (congestive) heart failure: Secondary | ICD-10-CM | POA: Diagnosis not present

## 2023-07-02 DIAGNOSIS — E782 Mixed hyperlipidemia: Secondary | ICD-10-CM | POA: Diagnosis not present

## 2023-07-02 DIAGNOSIS — R0602 Shortness of breath: Secondary | ICD-10-CM | POA: Diagnosis not present

## 2023-07-02 DIAGNOSIS — E119 Type 2 diabetes mellitus without complications: Secondary | ICD-10-CM | POA: Diagnosis not present

## 2023-07-02 DIAGNOSIS — E1159 Type 2 diabetes mellitus with other circulatory complications: Secondary | ICD-10-CM | POA: Diagnosis not present

## 2023-07-02 DIAGNOSIS — G4733 Obstructive sleep apnea (adult) (pediatric): Secondary | ICD-10-CM | POA: Diagnosis not present

## 2023-07-02 DIAGNOSIS — I480 Paroxysmal atrial fibrillation: Secondary | ICD-10-CM | POA: Diagnosis not present

## 2023-07-02 DIAGNOSIS — G4734 Idiopathic sleep related nonobstructive alveolar hypoventilation: Secondary | ICD-10-CM | POA: Diagnosis not present

## 2023-07-04 MED ORDER — LORAZEPAM 0.5 MG TABLET
ORAL_TABLET | Freq: Every day | ORAL | 0 refills | 10.00 days | PRN
Start: 2023-07-04 — End: 2024-07-03

## 2023-07-09 ENCOUNTER — Ambulatory Visit: Admit: 2023-07-09 | Discharge: 2023-07-10 | Payer: MEDICARE

## 2023-07-09 ENCOUNTER — Ambulatory Visit
Admit: 2023-07-09 | Discharge: 2023-07-10 | Payer: MEDICARE | Attending: Student in an Organized Health Care Education/Training Program | Primary: Student in an Organized Health Care Education/Training Program

## 2023-07-09 ENCOUNTER — Ambulatory Visit: Admit: 2023-07-09 | Payer: MEDICARE | Attending: Speech-Language Pathologist | Primary: Speech-Language Pathologist

## 2023-07-09 DIAGNOSIS — R1310 Dysphagia, unspecified: Principal | ICD-10-CM

## 2023-07-09 DIAGNOSIS — R490 Dysphonia: Principal | ICD-10-CM

## 2023-07-09 DIAGNOSIS — J383 Other diseases of vocal cords: Principal | ICD-10-CM

## 2023-07-09 DIAGNOSIS — J387 Other diseases of larynx: Principal | ICD-10-CM

## 2023-07-09 DIAGNOSIS — Q319 Congenital malformation of larynx, unspecified: Principal | ICD-10-CM

## 2023-07-13 DIAGNOSIS — J449 Chronic obstructive pulmonary disease, unspecified: Secondary | ICD-10-CM | POA: Diagnosis not present

## 2023-07-13 MED ORDER — JARDIANCE 10 MG TABLET
ORAL_TABLET | Freq: Every day | ORAL | 2 refills | 100.00 days
Start: 2023-07-13 — End: ?

## 2023-07-13 MED ORDER — FUROSEMIDE 80 MG TABLET
ORAL_TABLET | Freq: Every day | ORAL | 2 refills | 100.00 days
Start: 2023-07-13 — End: ?

## 2023-07-13 MED ORDER — ATORVASTATIN 20 MG TABLET
ORAL_TABLET | Freq: Every day | ORAL | 2 refills | 100.00 days
Start: 2023-07-13 — End: ?

## 2023-07-13 MED ORDER — AMLODIPINE 2.5 MG TABLET
ORAL_TABLET | Freq: Every day | ORAL | 2 refills | 100.00 days
Start: 2023-07-13 — End: ?

## 2023-07-17 ENCOUNTER — Encounter: Admit: 2023-07-17 | Discharge: 2023-07-18 | Payer: MEDICARE

## 2023-07-17 DIAGNOSIS — D508 Other iron deficiency anemias: Secondary | ICD-10-CM | POA: Diagnosis not present

## 2023-07-20 ENCOUNTER — Ambulatory Visit: Admit: 2023-07-20 | Discharge: 2023-07-21 | Payer: MEDICARE

## 2023-07-20 DIAGNOSIS — E1142 Type 2 diabetes mellitus with diabetic polyneuropathy: Principal | ICD-10-CM

## 2023-07-20 DIAGNOSIS — I152 Hypertension secondary to endocrine disorders: Principal | ICD-10-CM

## 2023-07-20 DIAGNOSIS — L400 Psoriasis vulgaris: Principal | ICD-10-CM

## 2023-07-20 DIAGNOSIS — M48062 Spinal stenosis, lumbar region with neurogenic claudication: Principal | ICD-10-CM

## 2023-07-20 DIAGNOSIS — E1159 Type 2 diabetes mellitus with other circulatory complications: Principal | ICD-10-CM

## 2023-07-20 DIAGNOSIS — H9313 Tinnitus, bilateral: Principal | ICD-10-CM

## 2023-07-20 DIAGNOSIS — F321 Major depressive disorder, single episode, moderate: Principal | ICD-10-CM

## 2023-07-20 DIAGNOSIS — J449 Chronic obstructive pulmonary disease, unspecified: Principal | ICD-10-CM

## 2023-07-20 DIAGNOSIS — E785 Hyperlipidemia, unspecified: Principal | ICD-10-CM

## 2023-07-20 DIAGNOSIS — G4733 Obstructive sleep apnea (adult) (pediatric): Principal | ICD-10-CM

## 2023-07-20 DIAGNOSIS — M791 Myalgia, unspecified site: Principal | ICD-10-CM

## 2023-07-20 DIAGNOSIS — I951 Orthostatic hypotension: Principal | ICD-10-CM

## 2023-07-20 DIAGNOSIS — G4700 Insomnia, unspecified: Principal | ICD-10-CM

## 2023-07-20 DIAGNOSIS — E1169 Type 2 diabetes mellitus with other specified complication: Principal | ICD-10-CM

## 2023-07-20 DIAGNOSIS — R490 Dysphonia: Principal | ICD-10-CM

## 2023-07-20 DIAGNOSIS — D508 Other iron deficiency anemias: Principal | ICD-10-CM

## 2023-07-20 DIAGNOSIS — K5909 Other constipation: Principal | ICD-10-CM

## 2023-07-20 DIAGNOSIS — I480 Paroxysmal atrial fibrillation: Principal | ICD-10-CM

## 2023-07-20 DIAGNOSIS — D869 Sarcoidosis, unspecified: Principal | ICD-10-CM

## 2023-07-20 DIAGNOSIS — F39 Unspecified mood [affective] disorder: Principal | ICD-10-CM

## 2023-07-20 MED ORDER — TRAMADOL 50 MG TABLET
ORAL_TABLET | Freq: Two times a day (BID) | ORAL | 0 refills | 10.00 days | Status: CN | PRN
Start: 2023-07-20 — End: 2024-07-19

## 2023-07-20 MED ORDER — MAGNESIUM OXIDE 400 MG (241.3 MG MAGNESIUM) TABLET
ORAL_TABLET | Freq: Every evening | ORAL | 3 refills | 90.00 days | Status: CP
Start: 2023-07-20 — End: 2024-07-19

## 2023-07-20 MED ORDER — HYDROCHLOROTHIAZIDE 12.5 MG TABLET
ORAL_TABLET | Freq: Every day | ORAL | 1 refills | 100.00 days | Status: CP
Start: 2023-07-20 — End: ?

## 2023-07-20 MED ORDER — TIZANIDINE 2 MG TABLET
ORAL_TABLET | Freq: Every evening | ORAL | 2 refills | 30.00 days | Status: CP | PRN
Start: 2023-07-20 — End: ?

## 2023-07-22 IMAGING — MR MR LUMBAR SPINE W/O CM
5 series · 31 of 48 positions shown · non-contrast
Comparison: 04/03/2018

CLINICAL DATA: Chronic low back pain radiating to both legs over
the last 6 weeks.

EXAM:
MRI LUMBAR SPINE WITHOUT CONTRAST
TECHNIQUE: Multiplanar, multisequence MR imaging of the lumbar spine was
performed. No intravenous contrast was administered.

[Series 5: T2 · sagittal · 4.0mm · 0.81mm/px · 6 of 17 slices shown (1 of 2)]
[im 1/17]
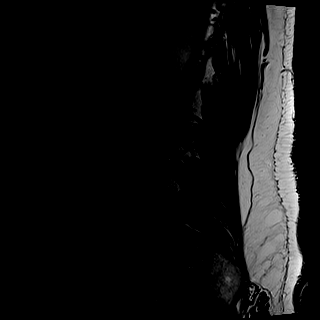
[im 4/17]
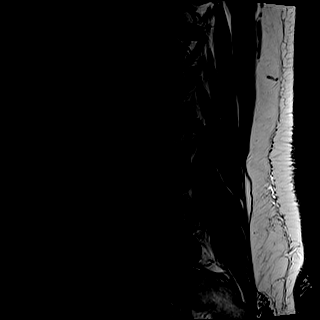
[im 7/17]
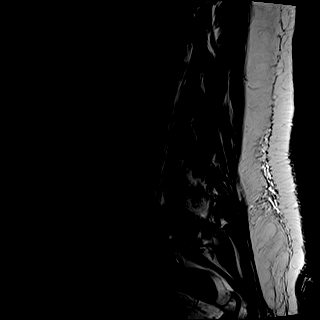
[im 10/17]
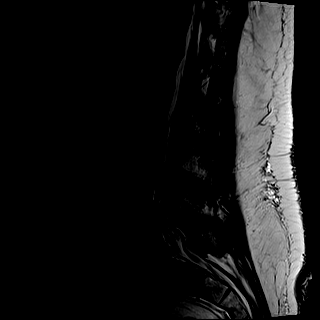
[im 13/17]
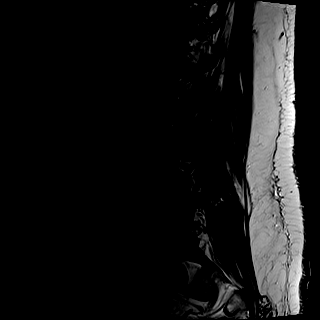
[im 17/17]
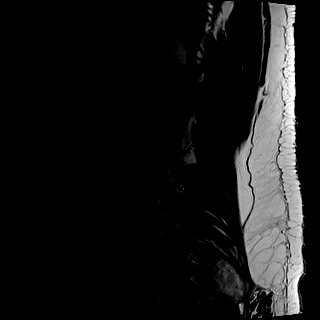

[Series 6: T1 · sagittal · 4.0mm · 0.81mm/px · 6 of 17 slices shown (1 of 2)]
[im 1/17]
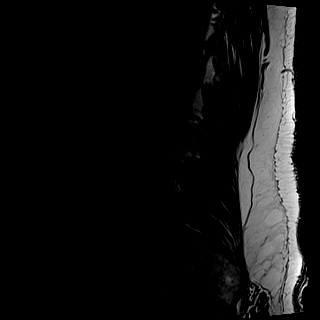
[im 4/17]
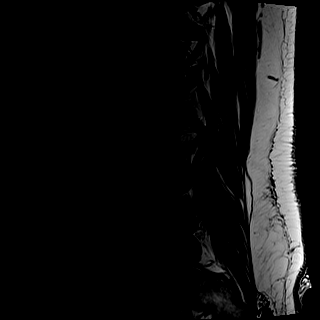
[im 7/17]
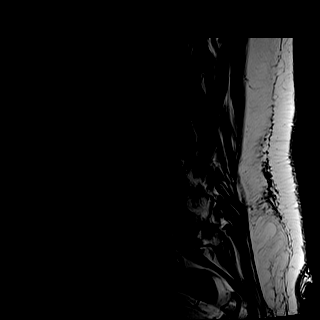
[im 10/17]
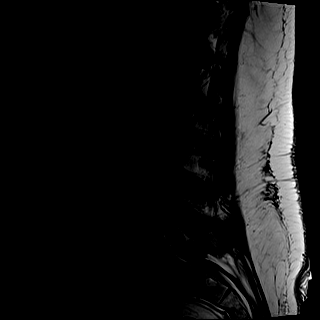
[im 13/17]
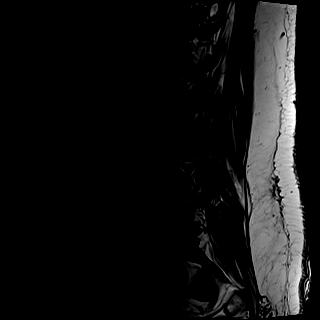
[im 17/17]
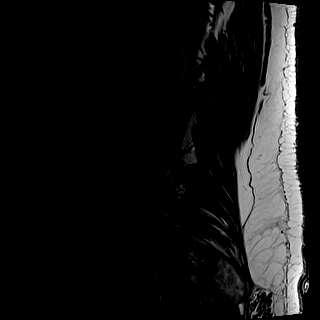

[Series 7: STIR · sagittal · 4.0mm · 0.41mm/px · 1 of 17 slices shown]
[im 1/17]
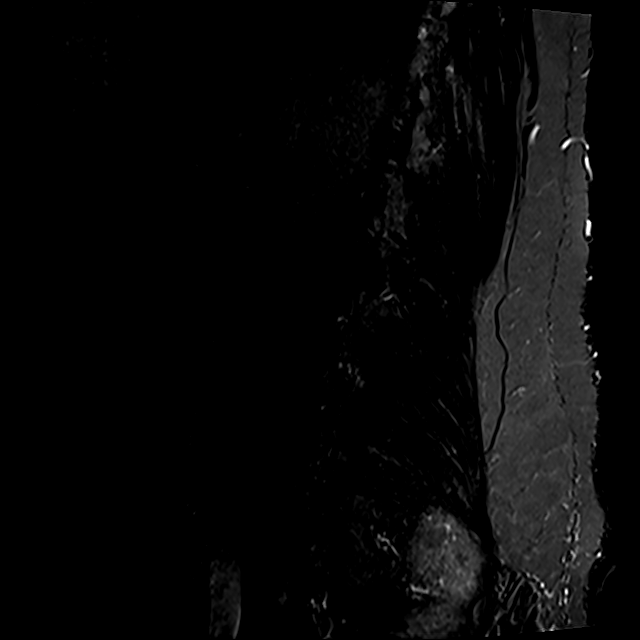

[Series 10: T2 · axial · 4.0mm · 0.78mm/px · z∈[-115,+128]mm · 9 of 40 slices shown (2 of 2)]
[im 1/40]
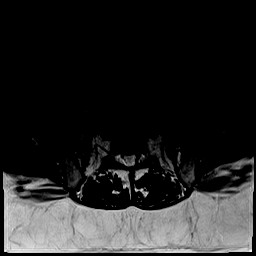
[im 6/40]
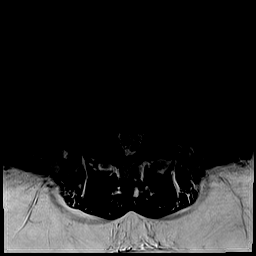
[im 12/40]
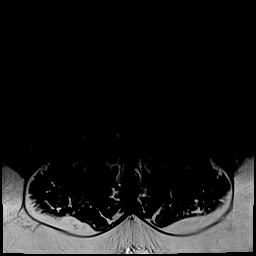
[im 17/40]
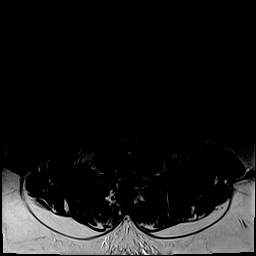
[im 20/40]
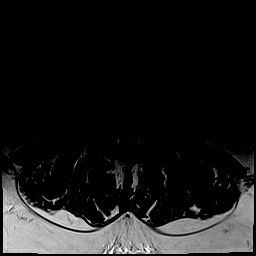
[im 23/40]
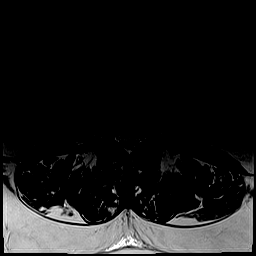
[im 28/40]
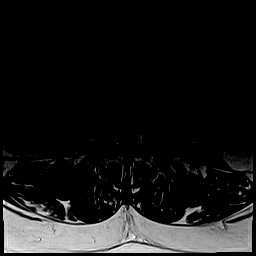
[im 34/40]
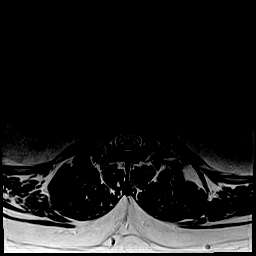
[im 40/40]
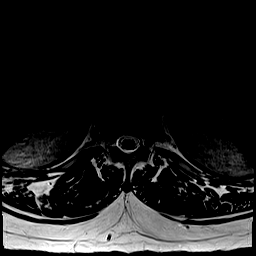

[Series 13: T1 · axial · 4.0mm · 0.39mm/px · z∈[-115,+128]mm · 9 of 40 slices shown (2 of 2)]
[im 1/40]
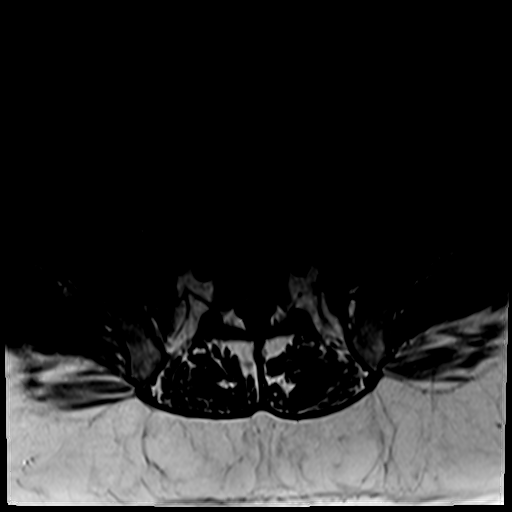
[im 6/40]
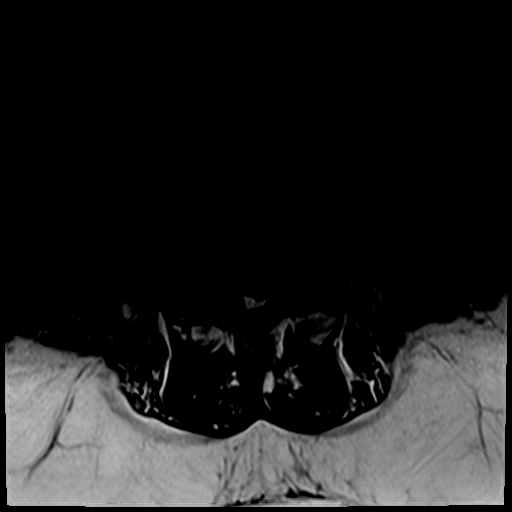
[im 12/40]
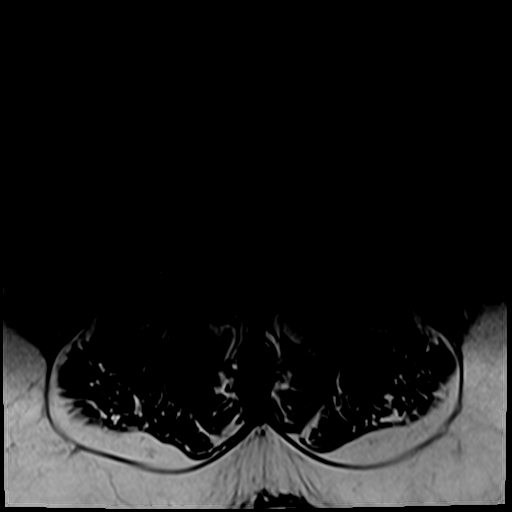
[im 17/40]
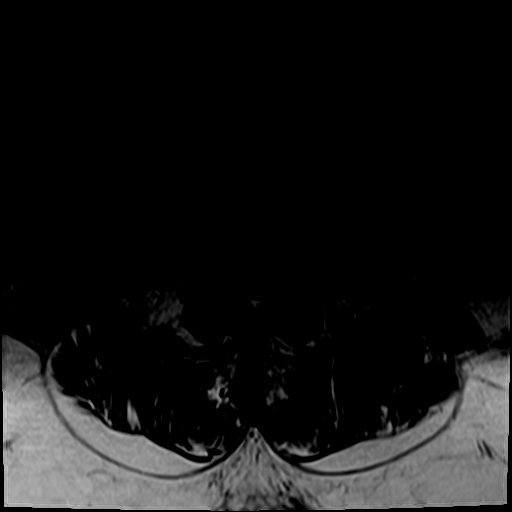
[im 20/40]
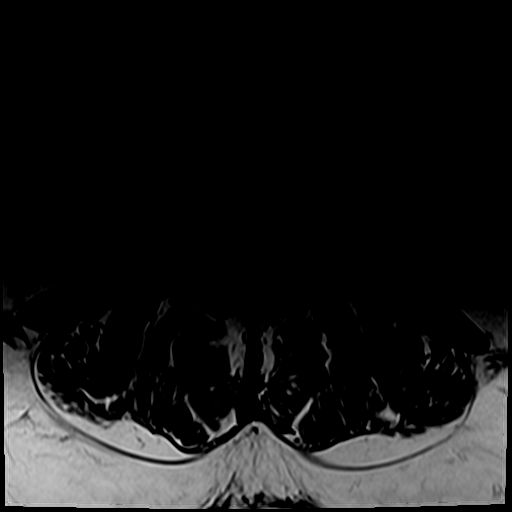
[im 23/40]
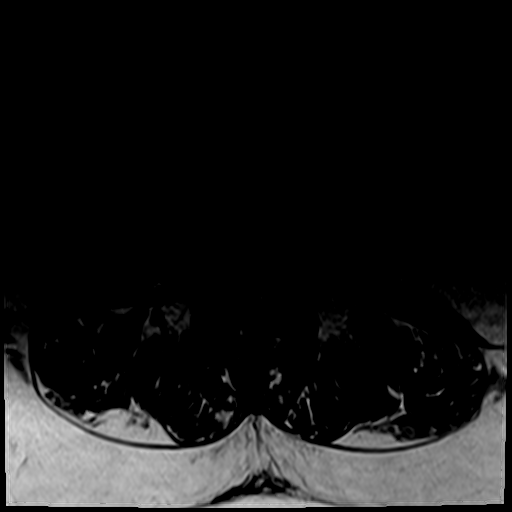
[im 28/40]
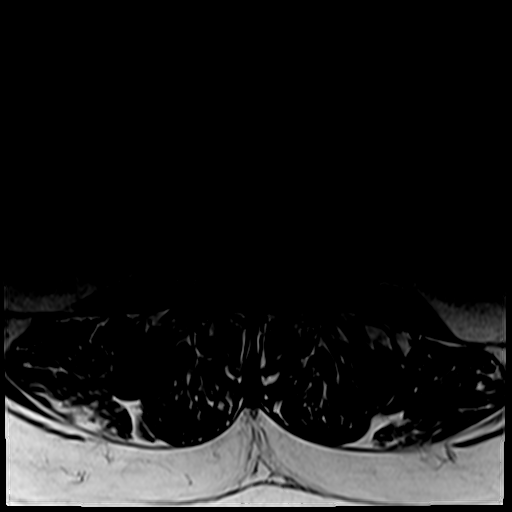
[im 34/40]
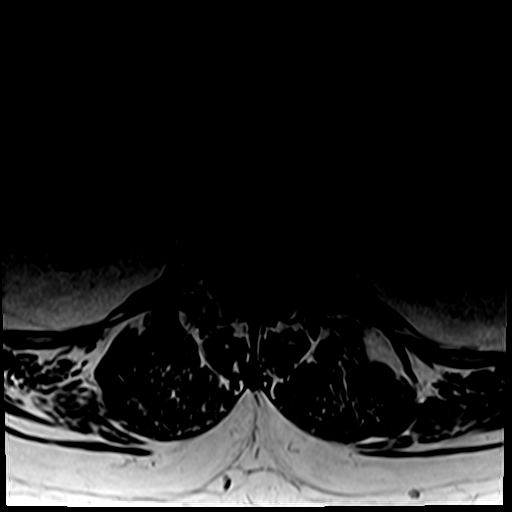
[im 40/40]
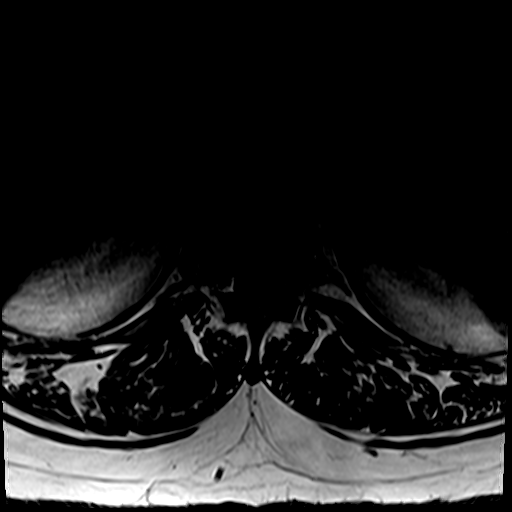

[31 of 48 positions shown; findings below may reference images not displayed]

FINDINGS: Segmentation:  5 lumbar type vertebral bodies.

Alignment:  Normal

Vertebrae:  Normal

Conus medullaris and cauda equina: Conus extends to the L1-2 level.
Conus and cauda equina appear normal.

Paraspinal and other soft tissues: Normal

Disc levels:

Minimal non-compressive disc bulges at L2-3 and above, unchanged
since the prior study.

L3-4: Desiccation of the disc with circumferential disc bulge. Mild
facet and ligamentous prominence. Mild narrowing of the lateral
recesses and neural foramina but without visible neural compression.
No change.

L4-5: Circumferential disc bulge. Bilateral facet osteoarthritis.
Small synovial cyst previously seen projecting medial from the right
facet is no longer visible. No central canal stenosis. Mild
narrowing of both lateral recesses. Foraminal stenosis right worse
than left. L4 nerve compression could occur, particularly on the
right. Findings in general appear to have worsened slightly.

L5-S1: Disc degeneration with loss of disc height. Endplate
osteophytes and bulging of the disc. No central canal stenosis. Mild
facet hypertrophy. Bilateral foraminal stenosis due to encroachment
by osteophyte and bulging disc material could compress either or
both L5 nerves. Similar appearance to the prior exam.
IMPRESSION: L3-4: Mild chronic degenerative changes without visible neural
compression or progression.

L4-5: Disc bulge. Facet degeneration and hypertrophy. Stenosis of
the lateral recesses and foramina could possibly cause neural
compression. Foraminal stenosis slightly worse on the right. Slight
worsening since 5245. Small synovial cyst previously seen is no
longer visible.

L5-S1: Disc and facet degeneration. Bilateral foraminal stenosis
could compress either or both L5 nerves. Similar appearance to the
prior study.

## 2023-07-24 MED ORDER — OZEMPIC 1 MG/DOSE (4 MG/3 ML) SUBCUTANEOUS PEN INJECTOR
3 refills | 0 days
Start: 2023-07-24 — End: ?

## 2023-07-29 MED ORDER — OZEMPIC 1 MG/DOSE (4 MG/3 ML) SUBCUTANEOUS PEN INJECTOR
3 refills | 0 days
Start: 2023-07-29 — End: ?

## 2023-08-06 MED ORDER — GABAPENTIN 800 MG TABLET
ORAL_TABLET | Freq: Two times a day (BID) | ORAL | 2 refills | 100.00 days
Start: 2023-08-06 — End: ?

## 2023-08-13 DIAGNOSIS — J449 Chronic obstructive pulmonary disease, unspecified: Secondary | ICD-10-CM | POA: Diagnosis not present

## 2023-08-14 ENCOUNTER — Inpatient Hospital Stay: Admit: 2023-08-14 | Discharge: 2023-08-15 | Payer: Medicare (Managed Care)

## 2023-08-14 DIAGNOSIS — Q319 Congenital malformation of larynx, unspecified: Principal | ICD-10-CM

## 2023-08-14 DIAGNOSIS — J387 Other diseases of larynx: Principal | ICD-10-CM

## 2023-08-14 DIAGNOSIS — J383 Other diseases of vocal cords: Principal | ICD-10-CM

## 2023-08-14 DIAGNOSIS — J219 Acute bronchiolitis, unspecified: Secondary | ICD-10-CM | POA: Diagnosis not present

## 2023-08-14 DIAGNOSIS — R918 Other nonspecific abnormal finding of lung field: Secondary | ICD-10-CM | POA: Diagnosis not present

## 2023-08-14 DIAGNOSIS — J40 Bronchitis, not specified as acute or chronic: Secondary | ICD-10-CM | POA: Diagnosis not present

## 2023-08-15 DIAGNOSIS — E1142 Type 2 diabetes mellitus with diabetic polyneuropathy: Principal | ICD-10-CM

## 2023-08-15 MED ORDER — EMPAGLIFLOZIN 25 MG TABLET
ORAL_TABLET | Freq: Every day | ORAL | 3 refills | 90.00 days | Status: CP
Start: 2023-08-15 — End: ?

## 2023-08-29 ENCOUNTER — Ambulatory Visit
Admit: 2023-08-29 | Discharge: 2023-08-30 | Payer: Medicare (Managed Care) | Attending: Student in an Organized Health Care Education/Training Program | Primary: Student in an Organized Health Care Education/Training Program

## 2023-08-29 DIAGNOSIS — J383 Other diseases of vocal cords: Principal | ICD-10-CM

## 2023-08-29 DIAGNOSIS — J387 Other diseases of larynx: Principal | ICD-10-CM

## 2023-08-29 DIAGNOSIS — R490 Dysphonia: Principal | ICD-10-CM

## 2023-08-31 ENCOUNTER — Ambulatory Visit: Admit: 2023-08-31 | Discharge: 2023-08-31 | Payer: Medicare (Managed Care)

## 2023-08-31 DIAGNOSIS — F321 Major depressive disorder, single episode, moderate: Principal | ICD-10-CM

## 2023-08-31 DIAGNOSIS — E1142 Type 2 diabetes mellitus with diabetic polyneuropathy: Principal | ICD-10-CM

## 2023-08-31 DIAGNOSIS — I152 Hypertension secondary to endocrine disorders: Principal | ICD-10-CM

## 2023-08-31 DIAGNOSIS — Z0000 Encounter for general adult medical examination without abnormal findings: Principal | ICD-10-CM

## 2023-08-31 DIAGNOSIS — I480 Paroxysmal atrial fibrillation: Principal | ICD-10-CM

## 2023-08-31 DIAGNOSIS — E1169 Type 2 diabetes mellitus with other specified complication: Principal | ICD-10-CM

## 2023-08-31 DIAGNOSIS — Z7189 Other specified counseling: Principal | ICD-10-CM

## 2023-08-31 DIAGNOSIS — J449 Chronic obstructive pulmonary disease, unspecified: Principal | ICD-10-CM

## 2023-08-31 DIAGNOSIS — E785 Hyperlipidemia, unspecified: Principal | ICD-10-CM

## 2023-08-31 DIAGNOSIS — E1159 Type 2 diabetes mellitus with other circulatory complications: Principal | ICD-10-CM

## 2023-08-31 DIAGNOSIS — I10 Essential (primary) hypertension: Secondary | ICD-10-CM | POA: Diagnosis not present

## 2023-09-04 ENCOUNTER — Ambulatory Visit
Admit: 2023-09-04 | Discharge: 2023-09-05 | Payer: Medicare (Managed Care) | Attending: Student in an Organized Health Care Education/Training Program | Primary: Student in an Organized Health Care Education/Training Program

## 2023-09-05 DIAGNOSIS — C329 Malignant neoplasm of larynx, unspecified: Principal | ICD-10-CM

## 2023-09-07 ENCOUNTER — Inpatient Hospital Stay: Admit: 2023-09-07 | Discharge: 2023-09-08 | Payer: Medicare (Managed Care)

## 2023-09-11 ENCOUNTER — Ambulatory Visit: Admit: 2023-09-11 | Discharge: 2023-09-12 | Payer: Medicare (Managed Care)

## 2023-09-11 DIAGNOSIS — R630 Anorexia: Principal | ICD-10-CM

## 2023-09-11 DIAGNOSIS — M48062 Spinal stenosis, lumbar region with neurogenic claudication: Principal | ICD-10-CM

## 2023-09-11 DIAGNOSIS — F4321 Adjustment disorder with depressed mood: Principal | ICD-10-CM

## 2023-09-11 DIAGNOSIS — R490 Dysphonia: Principal | ICD-10-CM

## 2023-09-12 DIAGNOSIS — J449 Chronic obstructive pulmonary disease, unspecified: Secondary | ICD-10-CM | POA: Diagnosis not present

## 2023-09-14 ENCOUNTER — Inpatient Hospital Stay
Admit: 2023-09-14 | Discharge: 2023-09-15 | Payer: Medicare (Managed Care) | Attending: Student in an Organized Health Care Education/Training Program | Primary: Student in an Organized Health Care Education/Training Program

## 2023-09-14 DIAGNOSIS — C320 Malignant neoplasm of glottis: Secondary | ICD-10-CM | POA: Diagnosis not present

## 2023-09-14 DIAGNOSIS — C329 Malignant neoplasm of larynx, unspecified: Secondary | ICD-10-CM | POA: Diagnosis not present

## 2023-10-01 ENCOUNTER — Inpatient Hospital Stay: Admit: 2023-10-01 | Discharge: 2023-10-18 | Payer: Medicare (Managed Care)

## 2023-10-01 ENCOUNTER — Inpatient Hospital Stay: Admit: 2023-10-01 | Discharge: 2023-10-19 | Payer: Medicare (Managed Care)

## 2023-10-01 ENCOUNTER — Inpatient Hospital Stay
Admit: 2023-10-01 | Discharge: 2023-10-19 | Payer: Medicare (Managed Care) | Attending: Student in an Organized Health Care Education/Training Program | Primary: Student in an Organized Health Care Education/Training Program

## 2023-10-01 ENCOUNTER — Inpatient Hospital Stay: Admit: 2023-10-01 | Discharge: 2023-10-24 | Payer: Medicare (Managed Care)

## 2023-10-01 ENCOUNTER — Inpatient Hospital Stay: Admit: 2023-10-01 | Discharge: 2023-10-02 | Payer: Medicare (Managed Care)

## 2023-10-01 ENCOUNTER — Inpatient Hospital Stay: Admit: 2023-10-01 | Discharge: 2023-10-29 | Payer: Medicare (Managed Care)

## 2023-10-01 ENCOUNTER — Inpatient Hospital Stay
Admit: 2023-10-01 | Discharge: 2023-10-29 | Payer: Medicare (Managed Care) | Attending: Student in an Organized Health Care Education/Training Program | Primary: Student in an Organized Health Care Education/Training Program

## 2023-10-01 ENCOUNTER — Inpatient Hospital Stay
Admit: 2023-10-01 | Discharge: 2023-10-03 | Payer: Medicare (Managed Care) | Attending: Student in an Organized Health Care Education/Training Program | Primary: Student in an Organized Health Care Education/Training Program

## 2023-10-01 ENCOUNTER — Inpatient Hospital Stay: Admit: 2023-10-01 | Discharge: 2023-10-23 | Payer: Medicare (Managed Care)

## 2023-10-01 ENCOUNTER — Inpatient Hospital Stay
Admit: 2023-10-01 | Discharge: 2023-10-17 | Payer: Medicare (Managed Care) | Attending: Student in an Organized Health Care Education/Training Program | Primary: Student in an Organized Health Care Education/Training Program

## 2023-10-01 ENCOUNTER — Inpatient Hospital Stay: Admit: 2023-10-01 | Discharge: 2023-10-20 | Payer: Medicare (Managed Care)

## 2023-10-02 DIAGNOSIS — C329 Malignant neoplasm of larynx, unspecified: Secondary | ICD-10-CM | POA: Diagnosis not present

## 2023-10-02 DIAGNOSIS — C320 Malignant neoplasm of glottis: Secondary | ICD-10-CM | POA: Diagnosis not present

## 2023-10-09 DIAGNOSIS — F4320 Adjustment disorder, unspecified: Principal | ICD-10-CM

## 2023-10-11 ENCOUNTER — Ambulatory Visit: Admit: 2023-10-11 | Discharge: 2023-10-11 | Payer: Medicare (Managed Care)

## 2023-10-11 DIAGNOSIS — E785 Hyperlipidemia, unspecified: Principal | ICD-10-CM

## 2023-10-11 DIAGNOSIS — E1159 Type 2 diabetes mellitus with other circulatory complications: Principal | ICD-10-CM

## 2023-10-11 DIAGNOSIS — E1142 Type 2 diabetes mellitus with diabetic polyneuropathy: Principal | ICD-10-CM

## 2023-10-11 DIAGNOSIS — K5909 Other constipation: Principal | ICD-10-CM

## 2023-10-11 DIAGNOSIS — M48062 Spinal stenosis, lumbar region with neurogenic claudication: Principal | ICD-10-CM

## 2023-10-11 DIAGNOSIS — I152 Hypertension secondary to endocrine disorders: Principal | ICD-10-CM

## 2023-10-11 DIAGNOSIS — R490 Dysphonia: Principal | ICD-10-CM

## 2023-10-11 DIAGNOSIS — F4321 Adjustment disorder with depressed mood: Principal | ICD-10-CM

## 2023-10-11 DIAGNOSIS — E1169 Type 2 diabetes mellitus with other specified complication: Principal | ICD-10-CM

## 2023-10-11 DIAGNOSIS — R1310 Dysphagia, unspecified: Principal | ICD-10-CM

## 2023-10-11 DIAGNOSIS — C329 Malignant neoplasm of larynx, unspecified: Secondary | ICD-10-CM | POA: Diagnosis not present

## 2023-10-11 DIAGNOSIS — C320 Malignant neoplasm of glottis: Secondary | ICD-10-CM | POA: Diagnosis not present

## 2023-10-11 MED ORDER — PREGABALIN 25 MG CAPSULE
ORAL_CAPSULE | ORAL | 1 refills | 35.00000 days | Status: CP
Start: 2023-10-11 — End: 2023-12-06

## 2023-10-11 MED ORDER — EDOXABAN 60 MG TABLET
ORAL_TABLET | Freq: Every day | ORAL | 1 refills | 90.00000 days | Status: CP
Start: 2023-10-11 — End: ?

## 2023-10-13 DIAGNOSIS — J449 Chronic obstructive pulmonary disease, unspecified: Secondary | ICD-10-CM | POA: Diagnosis not present

## 2023-10-16 DIAGNOSIS — C320 Malignant neoplasm of glottis: Secondary | ICD-10-CM | POA: Diagnosis not present

## 2023-10-16 DIAGNOSIS — C329 Malignant neoplasm of larynx, unspecified: Secondary | ICD-10-CM | POA: Diagnosis not present

## 2023-10-17 DIAGNOSIS — C329 Malignant neoplasm of larynx, unspecified: Secondary | ICD-10-CM | POA: Diagnosis not present

## 2023-10-17 DIAGNOSIS — C320 Malignant neoplasm of glottis: Secondary | ICD-10-CM | POA: Diagnosis not present

## 2023-10-18 DIAGNOSIS — C320 Malignant neoplasm of glottis: Secondary | ICD-10-CM | POA: Diagnosis not present

## 2023-10-18 DIAGNOSIS — C329 Malignant neoplasm of larynx, unspecified: Secondary | ICD-10-CM | POA: Diagnosis not present

## 2023-10-19 DIAGNOSIS — C320 Malignant neoplasm of glottis: Secondary | ICD-10-CM | POA: Diagnosis not present

## 2023-10-19 DIAGNOSIS — C329 Malignant neoplasm of larynx, unspecified: Secondary | ICD-10-CM | POA: Diagnosis not present

## 2023-10-22 ENCOUNTER — Inpatient Hospital Stay: Admit: 2023-10-22 | Discharge: 2023-10-23 | Payer: Medicare (Managed Care)

## 2023-10-22 DIAGNOSIS — C320 Malignant neoplasm of glottis: Secondary | ICD-10-CM | POA: Diagnosis not present

## 2023-10-22 DIAGNOSIS — C329 Malignant neoplasm of larynx, unspecified: Secondary | ICD-10-CM | POA: Diagnosis not present

## 2023-10-22 DIAGNOSIS — R1313 Dysphagia, pharyngeal phase: Secondary | ICD-10-CM | POA: Diagnosis not present

## 2023-10-23 DIAGNOSIS — C329 Malignant neoplasm of larynx, unspecified: Secondary | ICD-10-CM | POA: Diagnosis not present

## 2023-10-23 DIAGNOSIS — C320 Malignant neoplasm of glottis: Secondary | ICD-10-CM | POA: Diagnosis not present

## 2023-10-24 DIAGNOSIS — C320 Malignant neoplasm of glottis: Secondary | ICD-10-CM | POA: Diagnosis not present

## 2023-10-24 DIAGNOSIS — C329 Malignant neoplasm of larynx, unspecified: Secondary | ICD-10-CM | POA: Diagnosis not present

## 2023-10-25 DIAGNOSIS — C320 Malignant neoplasm of glottis: Secondary | ICD-10-CM | POA: Diagnosis not present

## 2023-10-25 DIAGNOSIS — C329 Malignant neoplasm of larynx, unspecified: Secondary | ICD-10-CM | POA: Diagnosis not present

## 2023-10-25 MED ORDER — ALUMINUM-MAG HYDROXIDE-SIMETHICONE 200 MG-200 MG-20 MG/5 ML ORAL SUSP
ORAL | 0 refills | 0.00000 days | Status: CP
Start: 2023-10-25 — End: ?
  Filled 2023-10-26: qty 355, 11d supply, fill #0

## 2023-10-25 MED ORDER — DIPHENHYDRAMINE 12.5 MG/5 ML ORAL LIQUID
ORAL | 0 refills | 0.00000 days | Status: CP
Start: 2023-10-25 — End: ?
  Filled 2023-10-26: qty 118, 5d supply, fill #0

## 2023-10-25 MED ORDER — LIDOCAINE HCL 2 % MUCOSAL SOLUTION
0 refills | 0.00000 days | Status: CP
Start: 2023-10-25 — End: ?
  Filled 2023-10-26: qty 100, 5d supply, fill #0

## 2023-10-26 ENCOUNTER — Ambulatory Visit: Admit: 2023-10-26 | Discharge: 2023-10-27 | Payer: Medicare (Managed Care)

## 2023-10-26 DIAGNOSIS — C320 Malignant neoplasm of glottis: Secondary | ICD-10-CM | POA: Diagnosis not present

## 2023-10-26 DIAGNOSIS — C329 Malignant neoplasm of larynx, unspecified: Secondary | ICD-10-CM | POA: Diagnosis not present

## 2023-10-29 DIAGNOSIS — C329 Malignant neoplasm of larynx, unspecified: Secondary | ICD-10-CM | POA: Diagnosis not present

## 2023-10-29 DIAGNOSIS — C320 Malignant neoplasm of glottis: Secondary | ICD-10-CM | POA: Diagnosis not present

## 2023-10-30 ENCOUNTER — Inpatient Hospital Stay: Admit: 2023-10-30 | Discharge: 2023-11-17 | Payer: Medicare (Managed Care)

## 2023-10-30 ENCOUNTER — Inpatient Hospital Stay: Admit: 2023-10-30 | Discharge: 2023-11-15 | Payer: Medicare (Managed Care)

## 2023-10-30 ENCOUNTER — Inpatient Hospital Stay
Admit: 2023-10-30 | Discharge: 2023-11-09 | Payer: Medicare (Managed Care) | Attending: Student in an Organized Health Care Education/Training Program | Primary: Student in an Organized Health Care Education/Training Program

## 2023-10-30 ENCOUNTER — Inpatient Hospital Stay: Admit: 2023-10-30 | Discharge: 2023-11-22 | Payer: Medicare (Managed Care)

## 2023-10-30 ENCOUNTER — Inpatient Hospital Stay: Admit: 2023-10-30 | Discharge: 2023-11-16 | Payer: Medicare (Managed Care)

## 2023-10-30 ENCOUNTER — Inpatient Hospital Stay: Admit: 2023-10-30 | Discharge: 2023-11-10 | Payer: Medicare (Managed Care)

## 2023-10-30 ENCOUNTER — Inpatient Hospital Stay
Admit: 2023-10-30 | Discharge: 2023-11-29 | Payer: Medicare (Managed Care) | Attending: Student in an Organized Health Care Education/Training Program | Primary: Student in an Organized Health Care Education/Training Program

## 2023-10-30 ENCOUNTER — Inpatient Hospital Stay: Admit: 2023-10-30 | Discharge: 2023-11-29 | Payer: Medicare (Managed Care)

## 2023-10-30 ENCOUNTER — Inpatient Hospital Stay: Admit: 2023-10-30 | Discharge: 2023-10-31 | Payer: Medicare (Managed Care)

## 2023-10-30 ENCOUNTER — Inpatient Hospital Stay: Admit: 2023-10-30 | Discharge: 2023-11-20 | Payer: Medicare (Managed Care)

## 2023-10-30 ENCOUNTER — Inpatient Hospital Stay: Admit: 2023-10-30 | Discharge: 2023-11-06 | Payer: Medicare (Managed Care)

## 2023-10-30 ENCOUNTER — Inpatient Hospital Stay: Admit: 2023-10-30 | Discharge: 2023-11-07 | Payer: Medicare (Managed Care)

## 2023-10-30 ENCOUNTER — Inpatient Hospital Stay: Admit: 2023-10-30 | Discharge: 2023-11-13 | Payer: Medicare (Managed Care)

## 2023-10-30 ENCOUNTER — Ambulatory Visit: Admit: 2023-10-30 | Payer: Medicare (Managed Care)

## 2023-10-30 ENCOUNTER — Inpatient Hospital Stay: Admit: 2023-10-30 | Discharge: 2023-11-23 | Payer: Medicare (Managed Care)

## 2023-10-30 ENCOUNTER — Inpatient Hospital Stay: Admit: 2023-10-30 | Discharge: 2023-11-01 | Payer: Medicare (Managed Care)

## 2023-10-30 ENCOUNTER — Inpatient Hospital Stay: Admit: 2023-10-30 | Discharge: 2023-11-09 | Payer: Medicare (Managed Care)

## 2023-10-30 ENCOUNTER — Inpatient Hospital Stay: Admit: 2023-10-30 | Discharge: 2023-11-02 | Payer: Medicare (Managed Care)

## 2023-10-30 ENCOUNTER — Inpatient Hospital Stay: Admit: 2023-10-30 | Discharge: 2023-11-21 | Payer: Medicare (Managed Care)

## 2023-10-30 ENCOUNTER — Inpatient Hospital Stay: Admit: 2023-10-30 | Discharge: 2023-11-14 | Payer: Medicare (Managed Care)

## 2023-10-30 ENCOUNTER — Inpatient Hospital Stay
Admit: 2023-10-30 | Discharge: 2023-11-02 | Payer: Medicare (Managed Care) | Attending: Student in an Organized Health Care Education/Training Program | Primary: Student in an Organized Health Care Education/Training Program

## 2023-10-30 ENCOUNTER — Inpatient Hospital Stay: Admit: 2023-10-30 | Discharge: 2023-11-24 | Payer: Medicare (Managed Care)

## 2023-10-30 ENCOUNTER — Inpatient Hospital Stay
Admit: 2023-10-30 | Discharge: 2023-11-23 | Payer: Medicare (Managed Care) | Attending: Student in an Organized Health Care Education/Training Program | Primary: Student in an Organized Health Care Education/Training Program

## 2023-10-30 ENCOUNTER — Inpatient Hospital Stay
Admit: 2023-10-30 | Discharge: 2023-11-16 | Payer: Medicare (Managed Care) | Attending: Student in an Organized Health Care Education/Training Program | Primary: Student in an Organized Health Care Education/Training Program

## 2023-11-01 MED ORDER — MOMETASONE 0.1 % TOPICAL CREAM
TOPICAL | 1 refills | 0.00000 days | Status: CP
Start: 2023-11-01 — End: 2024-10-31

## 2023-11-05 ENCOUNTER — Inpatient Hospital Stay: Admit: 2023-11-05 | Discharge: 2023-11-05 | Payer: Medicare (Managed Care)

## 2023-11-08 ENCOUNTER — Ambulatory Visit: Admit: 2023-11-08 | Discharge: 2023-11-09 | Payer: Medicare (Managed Care)

## 2023-11-08 DIAGNOSIS — I152 Hypertension secondary to endocrine disorders: Principal | ICD-10-CM

## 2023-11-08 DIAGNOSIS — M542 Cervicalgia: Principal | ICD-10-CM

## 2023-11-08 DIAGNOSIS — R1310 Dysphagia, unspecified: Principal | ICD-10-CM

## 2023-11-08 DIAGNOSIS — E1169 Type 2 diabetes mellitus with other specified complication: Principal | ICD-10-CM

## 2023-11-08 DIAGNOSIS — R490 Dysphonia: Principal | ICD-10-CM

## 2023-11-08 DIAGNOSIS — R5383 Other fatigue: Principal | ICD-10-CM

## 2023-11-08 DIAGNOSIS — E1142 Type 2 diabetes mellitus with diabetic polyneuropathy: Principal | ICD-10-CM

## 2023-11-08 DIAGNOSIS — E785 Hyperlipidemia, unspecified: Principal | ICD-10-CM

## 2023-11-08 DIAGNOSIS — E1159 Type 2 diabetes mellitus with other circulatory complications: Principal | ICD-10-CM

## 2023-11-08 DIAGNOSIS — M48062 Spinal stenosis, lumbar region with neurogenic claudication: Principal | ICD-10-CM

## 2023-11-08 DIAGNOSIS — F4323 Adjustment disorder with mixed anxiety and depressed mood: Principal | ICD-10-CM

## 2023-11-08 MED ORDER — TIZANIDINE 2 MG TABLET
ORAL_TABLET | Freq: Every evening | ORAL | 2 refills | 30.00000 days | Status: CP | PRN
Start: 2023-11-08 — End: ?

## 2023-11-08 MED ORDER — DESVENLAFAXINE ER 50 MG TABLET,EXTENDED RELEASE 24 HR
ORAL_TABLET | Freq: Every day | ORAL | 5 refills | 60.00000 days | Status: CP
Start: 2023-11-08 — End: ?

## 2023-11-13 MED ORDER — LIDOCAINE HCL 2 % MUCOSAL SOLUTION
2 refills | 0.00000 days | Status: CP
Start: 2023-11-13 — End: ?
  Filled 2023-11-14: qty 100, 2d supply, fill #0

## 2023-11-13 MED ORDER — DIPHENHYDRAMINE 12.5 MG/5 ML ORAL LIQUID
ORAL | 2 refills | 0.00000 days | Status: CP
Start: 2023-11-13 — End: ?
  Filled 2023-11-14: qty 118, 2d supply, fill #0

## 2023-11-13 MED ORDER — ALUMINUM-MAG HYDROXIDE-SIMETHICONE 200 MG-200 MG-20 MG/5 ML ORAL SUSP
ORAL | 0 refills | 0.00000 days | Status: CP
Start: 2023-11-13 — End: ?
  Filled 2023-11-14: qty 355, 6d supply, fill #0

## 2023-11-22 MED ORDER — LIDOCAINE HCL 2 % MUCOSAL SOLUTION
OROMUCOSAL | 1 refills | 2.00000 days | Status: CP | PRN
Start: 2023-11-22 — End: ?

## 2023-11-22 MED ORDER — SILVER SULFADIAZINE 1 % TOPICAL CREAM
TOPICAL | 1 refills | 0.00000 days | Status: CP
Start: 2023-11-22 — End: 2024-11-21

## 2023-11-26 ENCOUNTER — Inpatient Hospital Stay: Admit: 2023-11-26 | Discharge: 2023-11-27 | Payer: Medicare (Managed Care)

## 2023-12-05 DIAGNOSIS — C329 Malignant neoplasm of larynx, unspecified: Principal | ICD-10-CM

## 2023-12-21 DIAGNOSIS — E1169 Type 2 diabetes mellitus with other specified complication: Principal | ICD-10-CM

## 2023-12-21 MED ORDER — ONETOUCH VERIO TEST STRIPS
ORAL_STRIP | 3 refills | 0.00000 days | Status: CP
Start: 2023-12-21 — End: ?

## 2024-05-05 DIAGNOSIS — F4323 Adjustment disorder with mixed anxiety and depressed mood: Principal | ICD-10-CM

## 2024-05-05 DIAGNOSIS — M48062 Spinal stenosis, lumbar region with neurogenic claudication: Principal | ICD-10-CM

## 2024-05-05 MED ORDER — DESVENLAFAXINE ER 50 MG TABLET,EXTENDED RELEASE 24 HR
ORAL_TABLET | Freq: Every day | ORAL | 5 refills | 60.00000 days | Status: CP
Start: 2024-05-05 — End: ?
